# Patient Record
Sex: Female | Born: 1935 | Race: White | Hispanic: No | Smoking: Former smoker
Health system: Southern US, Community
[De-identification: ages and names within clinical notes are randomized; demographics above are authoritative.]

## PROBLEM LIST (undated history)

## (undated) DIAGNOSIS — R413 Other amnesia: Secondary | ICD-10-CM

## (undated) DIAGNOSIS — I6782 Cerebral ischemia: Secondary | ICD-10-CM

## (undated) DIAGNOSIS — Z803 Family history of malignant neoplasm of breast: Secondary | ICD-10-CM

## (undated) DIAGNOSIS — R609 Edema, unspecified: Secondary | ICD-10-CM

## (undated) DIAGNOSIS — R2681 Unsteadiness on feet: Secondary | ICD-10-CM

## (undated) DIAGNOSIS — M1990 Unspecified osteoarthritis, unspecified site: Secondary | ICD-10-CM

## (undated) DIAGNOSIS — C50912 Malignant neoplasm of unspecified site of left female breast: Secondary | ICD-10-CM

## (undated) DIAGNOSIS — F0390 Unspecified dementia without behavioral disturbance: Secondary | ICD-10-CM

## (undated) DIAGNOSIS — N183 Chronic kidney disease, stage 3 (moderate): Secondary | ICD-10-CM

## (undated) DIAGNOSIS — N393 Stress incontinence (female) (male): Secondary | ICD-10-CM

## (undated) DIAGNOSIS — M545 Low back pain, unspecified: Secondary | ICD-10-CM

## (undated) DIAGNOSIS — K219 Gastro-esophageal reflux disease without esophagitis: Secondary | ICD-10-CM

## (undated) DIAGNOSIS — I672 Cerebral atherosclerosis: Secondary | ICD-10-CM

## (undated) DIAGNOSIS — E785 Hyperlipidemia, unspecified: Secondary | ICD-10-CM

## (undated) DIAGNOSIS — R5383 Other fatigue: Secondary | ICD-10-CM

## (undated) DIAGNOSIS — Z9181 History of falling: Secondary | ICD-10-CM

## (undated) DIAGNOSIS — G319 Degenerative disease of nervous system, unspecified: Secondary | ICD-10-CM

## (undated) DIAGNOSIS — M810 Age-related osteoporosis without current pathological fracture: Secondary | ICD-10-CM

## (undated) DIAGNOSIS — I10 Essential (primary) hypertension: Secondary | ICD-10-CM

## (undated) DIAGNOSIS — M25519 Pain in unspecified shoulder: Secondary | ICD-10-CM

## (undated) DIAGNOSIS — M2142 Flat foot [pes planus] (acquired), left foot: Secondary | ICD-10-CM

## (undated) DIAGNOSIS — I255 Ischemic cardiomyopathy: Secondary | ICD-10-CM

## (undated) DIAGNOSIS — R05 Cough: Secondary | ICD-10-CM

## (undated) DIAGNOSIS — K5730 Diverticulosis of large intestine without perforation or abscess without bleeding: Secondary | ICD-10-CM

## (undated) DIAGNOSIS — K56609 Unspecified intestinal obstruction, unspecified as to partial versus complete obstruction: Secondary | ICD-10-CM

## (undated) DIAGNOSIS — N318 Other neuromuscular dysfunction of bladder: Secondary | ICD-10-CM

## (undated) DIAGNOSIS — R059 Cough, unspecified: Secondary | ICD-10-CM

## (undated) DIAGNOSIS — R5381 Other malaise: Secondary | ICD-10-CM

## (undated) DIAGNOSIS — R011 Cardiac murmur, unspecified: Secondary | ICD-10-CM

## (undated) DIAGNOSIS — I213 ST elevation (STEMI) myocardial infarction of unspecified site: Secondary | ICD-10-CM

## (undated) DIAGNOSIS — M2141 Flat foot [pes planus] (acquired), right foot: Secondary | ICD-10-CM

## (undated) DIAGNOSIS — M25559 Pain in unspecified hip: Secondary | ICD-10-CM

## (undated) DIAGNOSIS — H409 Unspecified glaucoma: Secondary | ICD-10-CM

## (undated) DIAGNOSIS — R1031 Right lower quadrant pain: Secondary | ICD-10-CM

## (undated) HISTORY — DX: Cerebral atherosclerosis: I67.2

## (undated) HISTORY — DX: Other malaise: R53.81

## (undated) HISTORY — DX: Unspecified osteoarthritis, unspecified site: M19.90

## (undated) HISTORY — DX: Cerebral ischemia: I67.82

## (undated) HISTORY — DX: History of falling: Z91.81

## (undated) HISTORY — DX: Unsteadiness on feet: R26.81

## (undated) HISTORY — DX: Unspecified glaucoma: H40.9

## (undated) HISTORY — DX: Cardiac murmur, unspecified: R01.1

## (undated) HISTORY — PX: TONSILLECTOMY: SHX5217

## (undated) HISTORY — DX: ST elevation (STEMI) myocardial infarction of unspecified site: I21.3

## (undated) HISTORY — DX: Low back pain, unspecified: M54.50

## (undated) HISTORY — DX: Diverticulosis of large intestine without perforation or abscess without bleeding: K57.30

## (undated) HISTORY — DX: Edema, unspecified: R60.9

## (undated) HISTORY — DX: Family history of malignant neoplasm of breast: Z80.3

## (undated) HISTORY — DX: Unspecified intestinal obstruction, unspecified as to partial versus complete obstruction: K56.609

## (undated) HISTORY — PX: BREAST LUMPECTOMY: SHX2

## (undated) HISTORY — DX: Flat foot (pes planus) (acquired), left foot: M21.42

## (undated) HISTORY — DX: Degenerative disease of nervous system, unspecified: G31.9

## (undated) HISTORY — DX: Low back pain: M54.5

## (undated) HISTORY — DX: Hyperlipidemia, unspecified: E78.5

## (undated) HISTORY — DX: Chronic kidney disease, stage 3 (moderate): N18.3

## (undated) HISTORY — DX: Age-related osteoporosis without current pathological fracture: M81.0

## (undated) HISTORY — DX: Pain in unspecified hip: M25.559

## (undated) HISTORY — DX: Pain in unspecified shoulder: M25.519

## (undated) HISTORY — DX: Gastro-esophageal reflux disease without esophagitis: K21.9

## (undated) HISTORY — DX: Malignant neoplasm of unspecified site of left female breast: C50.912

## (undated) HISTORY — PX: EYE SURGERY: SHX253

## (undated) HISTORY — DX: Other amnesia: R41.3

## (undated) HISTORY — DX: Other fatigue: R53.83

## (undated) HISTORY — DX: Stress incontinence (female) (male): N39.3

## (undated) HISTORY — DX: Right lower quadrant pain: R10.31

## (undated) HISTORY — DX: Essential (primary) hypertension: I10

## (undated) HISTORY — DX: Ischemic cardiomyopathy: I25.5

## (undated) HISTORY — DX: Other neuromuscular dysfunction of bladder: N31.8

## (undated) HISTORY — DX: Flat foot (pes planus) (acquired), right foot: M21.41

---

## 1996-02-19 HISTORY — PX: COLON SURGERY: SHX602

## 1997-09-26 ENCOUNTER — Other Ambulatory Visit: Admission: RE | Admit: 1997-09-26 | Discharge: 1997-09-26 | Payer: Self-pay

## 1998-02-07 ENCOUNTER — Encounter: Payer: Self-pay | Admitting: Emergency Medicine

## 1998-02-07 ENCOUNTER — Emergency Department (HOSPITAL_COMMUNITY): Admission: EM | Admit: 1998-02-07 | Discharge: 1998-02-07 | Payer: Self-pay | Admitting: Emergency Medicine

## 1999-04-21 ENCOUNTER — Encounter: Admission: RE | Admit: 1999-04-21 | Discharge: 1999-04-21 | Payer: Self-pay | Admitting: *Deleted

## 1999-09-26 ENCOUNTER — Other Ambulatory Visit: Admission: RE | Admit: 1999-09-26 | Discharge: 1999-09-26 | Payer: Self-pay | Admitting: *Deleted

## 2000-09-11 ENCOUNTER — Encounter: Admission: RE | Admit: 2000-09-11 | Discharge: 2000-09-11 | Payer: Self-pay | Admitting: Orthopedic Surgery

## 2000-09-11 ENCOUNTER — Encounter: Payer: Self-pay | Admitting: Orthopedic Surgery

## 2000-09-12 ENCOUNTER — Ambulatory Visit (HOSPITAL_BASED_OUTPATIENT_CLINIC_OR_DEPARTMENT_OTHER): Admission: RE | Admit: 2000-09-12 | Discharge: 2000-09-12 | Payer: Self-pay | Admitting: Orthopedic Surgery

## 2000-11-17 ENCOUNTER — Inpatient Hospital Stay (HOSPITAL_COMMUNITY): Admission: RE | Admit: 2000-11-17 | Discharge: 2000-11-21 | Payer: Self-pay | Admitting: Orthopedic Surgery

## 2000-12-19 HISTORY — PX: JOINT REPLACEMENT: SHX530

## 2001-11-17 ENCOUNTER — Ambulatory Visit (HOSPITAL_COMMUNITY): Admission: RE | Admit: 2001-11-17 | Discharge: 2001-11-17 | Payer: Self-pay | Admitting: Gastroenterology

## 2001-12-24 ENCOUNTER — Other Ambulatory Visit: Admission: RE | Admit: 2001-12-24 | Discharge: 2001-12-24 | Payer: Self-pay | Admitting: Obstetrics and Gynecology

## 2002-03-02 ENCOUNTER — Encounter: Admission: RE | Admit: 2002-03-02 | Discharge: 2002-03-02 | Payer: Self-pay | Admitting: Internal Medicine

## 2002-03-02 ENCOUNTER — Encounter: Payer: Self-pay | Admitting: Internal Medicine

## 2003-04-25 ENCOUNTER — Ambulatory Visit (HOSPITAL_COMMUNITY): Admission: RE | Admit: 2003-04-25 | Discharge: 2003-04-25 | Payer: Self-pay | Admitting: Gastroenterology

## 2003-12-01 ENCOUNTER — Ambulatory Visit (HOSPITAL_COMMUNITY): Admission: RE | Admit: 2003-12-01 | Discharge: 2003-12-01 | Payer: Self-pay | Admitting: Gastroenterology

## 2003-12-01 ENCOUNTER — Encounter (INDEPENDENT_AMBULATORY_CARE_PROVIDER_SITE_OTHER): Payer: Self-pay | Admitting: *Deleted

## 2004-05-25 ENCOUNTER — Other Ambulatory Visit: Admission: RE | Admit: 2004-05-25 | Discharge: 2004-05-25 | Payer: Self-pay | Admitting: Obstetrics and Gynecology

## 2004-12-28 ENCOUNTER — Encounter: Admission: RE | Admit: 2004-12-28 | Discharge: 2004-12-28 | Payer: Self-pay | Admitting: Internal Medicine

## 2007-01-08 ENCOUNTER — Encounter: Admission: RE | Admit: 2007-01-08 | Discharge: 2007-01-08 | Payer: Self-pay | Admitting: Internal Medicine

## 2007-02-04 ENCOUNTER — Other Ambulatory Visit: Admission: RE | Admit: 2007-02-04 | Discharge: 2007-02-04 | Payer: Self-pay | Admitting: Obstetrics & Gynecology

## 2007-03-20 ENCOUNTER — Encounter
Admission: RE | Admit: 2007-03-20 | Discharge: 2007-03-20 | Payer: Self-pay | Admitting: Physical Medicine and Rehabilitation

## 2010-02-02 LAB — HM DEXA SCAN: HM Dexa Scan: NORMAL

## 2010-07-06 NOTE — Op Note (Signed)
[name], [name]                ACCOUNT NO.:  [id number]   MEDICAL RECORD NO.:  [id number]          PATIENT TYPE:  AMB   LOCATION:  ENDO                         FACILITY:  [facility]   PHYSICIAN:  Tory Emerald. Benson Norway, MD    DATE OF BIRTH:  1925-06-10   DATE OF PROCEDURE:  12/01/2003  DATE OF DISCHARGE:                                 OPERATIVE REPORT   PROCEDURE:  Flexible sigmoidoscopy.   ENDOSCOPIST:  Tory Emerald. Benson Norway, MD   EQUIPMENT USED:  Pediatric colonoscope.   Consent was obtained from the patient describing the risks of bleeding,  infection, perforation, medication reaction, a 10% miss rate for a small  colon cancer or polyp, and the risk of death, all of which are not exclusive  of any other complications that can occur.   PHYSICAL EXAMINATION:  CARDIAC:  Regular rate and rhythm.  CHEST:  Lungs are clear to auscultation bilaterally.  ABDOMEN:  Flat, soft, nontender, nondistended.   MEDICATIONS:  None given at this time.  Previously provided during the EGD.   PROCEDURE:  After the upper endoscopy was performed, the patient was then  positioned for the flexible sigmoidoscopy.  A rectal examination was  performed, which was negative for any evidence of any masses.  The  colonoscope was then inserted from the anus and advanced into the ileum.  Photo documentation was obtained.  There was no evidence of any ulcers,  masses, inflammation, ulcerations, or bleeding.  Upon withdrawal of the  scope into the colon, there was note of the ileocolonic anastomosis, which  was approximately between 30-40 cm.  Again, there was no evidence of any  anastomotic ulcers or masses in this region.  Upon further withdrawal of the  colonoscope, there was no evidence of any diverticulosis or fresh blood or  any vascular abnormalities in the remnant  colon.  With retroflexion of the colonoscope in the rectum, the patient is  noted to have moderate-sized internal and external hemorrhoids.   PLAN AT  THIS TIME:  Follow up as previously described in the enteroscopy  procedure.       PDH/MEDQ  D:  12/01/2003  T:  12/01/2003  Job:  [id number]   cc:   Merrilee Seashore, M.D.  [address] [place]  [place] [address]  Fax: [phone]

## 2010-07-06 NOTE — Op Note (Signed)
[facility]  Patient:    [name], [name]                       MRN: [id number] Proc. Date: 09/12/00 Adm. Date:  [id number] Attending:  Isaac Laud                           Operative Report  PREOPERATIVE DIAGNOSIS:  Severe degenerative arthritis, left index finger distal interphalangeal joint with large dorsal radial mucous cyst and secondary nail deformity.  POSTOPERATIVE DIAGNOSIS:  Severe degenerative arthritis, left index finger distal interphalangeal joint with large dorsal radial mucous cyst and secondary nail deformity.  OPERATION PERFORMED: 1. Excision of mucous cyst. 2. Distal interphalangeal joint debridement and debridement of osteophytes    from distal and middle phalanges of left index finger at the distal    interphalangeal joint.  SURGEON:  Youlanda Mighty. Sypher, Brooke Bonito., M.D.  ASSISTANT:  Julian Reil, P.A.  ANESTHESIA:  0.25% Marcaine and 2% lidocaine metacarpal head level block, left index finger supplemented by intravenous sedation.  SUPERVISING ANESTHESIOLOGIST:  Dr. Chriss Driver.  INDICATIONS FOR PROCEDURE:  The patient is a 77 year old woman who has had chronic degenerative arthritis.  She was referred by [name]. Symanski, M.D. for evaluation of a painful and enlarging mucous cyst affecting her left index finger distal interphalangeal joint with secondary nail deformity along the radial nail plate.  Clinical examination in the office revealed that she had a bone-on-bone arthropathy at the DIP joint.  We discussed the spectrum of treatment options including observation, excision of the cyst and debridement of the joint versus excision of the cyst and fusion of the joint.  She would prefer to maintain her motion and therefore, we recommended simple debridement of the cyst, removal of osteophytes and joint debridement.  She is brought to the operating room at this time for the above-mentioned surgery. Questions  are invited and answered.  DESCRIPTION OF PROCEDURE:  The patient was brought to the operating room and placed in supine position on the operating table.  Following light sedation, the left arm was prepped with Betadine soap and solution and sterilely draped. 0.25% Marcaine and 2% lidocaine were infiltrated at the metacarpal head level at the base of the left index finger.  The procedure commenced with a curvilinear incision directly over the cyst. Care was taken to identify the cyst membrane.  The cyst was emptied of its contents and a rongeur was used to removed the membrane.  The capsule between the extensor mechanism and the radial collateral  ligament was resected with a scalpel and the joint was debrided with irrigation followed by use of a House curet to remove the marginal osteophytes on the dorsal radial corner of the distal phalanx and middle phalanx.  There was essentially no hyaline articular cartilage within the DIP joint on the head of the middle phalanx and minimal cartilage at the distal phalanx.  It is likely that [name] will continue to have discomfort in this joint due to severe arthritis.  We had advised her preoperatively to consider arthrodesis of the joint should she have persistent pain or recurrent cyst.  The wound was repaired with interrupted sutures of 5-0 nylon.  A compressive dressing was applied with Xeroflo, sterile gauze and Coban.  There were no apparent complications. DD:  09/12/00 TD:  09/12/00 Job: [id number]

## 2010-07-06 NOTE — Op Note (Signed)
NAME:  [name], LAURAIN                          ACCOUNT NO.:  [id number]   MEDICAL RECORD NO.:  [id number]                   PATIENT TYPE:  AMB   LOCATION:  ENDO                                 FACILITY:  [facility]   PHYSICIAN:  Nelwyn Salisbury, M.D.               DATE OF BIRTH:  02/27/1925   DATE OF PROCEDURE:  04/25/2003  DATE OF DISCHARGE:                                 OPERATIVE REPORT   PROCEDURE:  Enteroscopy.   ENDOSCOPIST:  Juanita Craver, M.D.   INSTRUMENT USED:  Olympus video colonoscope (adjustable pediatric scope).   INDICATIONS FOR PROCEDURE:  78 year old white female with a history of a  total colectomy for extensive diverticular disease with rectal bleeding,  rule out AVMs, masses, polyps.   PREPROCEDURE PREPARATION:  Informed consent was obtained from the patient.  The patient was fasted for eight hours prior to the procedure.   PREPROCEDURE PHYSICAL:  Patient with stable vital signs.  Neck supple.  Chest clear to auscultation.  S1 and S2 regular.  Abdomen soft with normal  bowel sounds.   DESCRIPTION OF PROCEDURE:  The patient was placed in the left lateral  decubitus position, sedated with 50 mg of Demerol and 4 mg Versed in slow  incremental doses.  Once the patient was adequately sedated, maintained on  low flow oxygen and continuous cardiac monitoring, the Olympus video  colonoscope was advanced through the mouth piece over the tongue into the  esophagus under direct vision.  The entire esophagus appeared normal with no  evidence of ring, stricture, masses, esophagitis, or Barrett's mucosa.  The  scope was then advanced into the stomach.  Mild atrophic gastritis was  noted.  No erosions, ulcerations, masses or polyps were seen.  No AVMs were  identified.  Retroflexion in the high cardia revealed a small hiatal hernia.  The scope was then advanced into the small bowel up to 120 cm and an  isolated diverticula was seen in the third part of the duodenum.  No AVMs,  erosions, masses, or polyps were identified.  The patient tolerated the  procedure well without complications.   IMPRESSION:  1. Normal appearing esophagus.  2. Mild atrophic gastritis.  3. Small hiatal hernia.  4. Isolated diverticulum in proximal small bowel.  5. No source of bleeding identified.   RECOMMENDATIONS:  1. Continue a high fiber diet with liberal fluid intake.  2. Outpatient follow up in the next two weeks for further recommendations.     If the patient has recurrence of rectal bleeding prior to follow up visit     in the office, she is to contact the office immediately.  Avoidance of     all nonsteroidals have been discovered for now.  Nelwyn Salisbury, M.D.    JNM/MEDQ  D:  04/26/2003  T:  04/26/2003  Job:  [id number]   cc:   Merrilee Seashore, M.D.  [place]  [place] [address]  Fax: [phone]

## 2010-07-06 NOTE — Op Note (Signed)
   NAME:  [name], [name]                          ACCOUNT NO.:  [id number]   MEDICAL RECORD NO.:  [id number]                   PATIENT TYPE:  AMB   LOCATION:  ENDO                                 FACILITY:  [facility]   PHYSICIAN:  Juanita Craver, M.D.                   DATE OF BIRTH:  February 06, 1926   DATE OF PROCEDURE:  11/17/2001  DATE OF DISCHARGE:                                 OPERATIVE REPORT   PREOPERATIVE DIAGNOSIS:  Flexible sigmoidoscopy.   ENDOSCOPIC ASSESSMENT:  Juanita Craver, M.D.   INSTRUMENT USED:  Olympus video colonoscope.   INDICATION FOR PROCEDURE:  A 77 year old white female with a history of  rectal bleeding, status post total colectomy for diverticular disease.   PREPROCEDURE PREPARATION:  Informed consent was procured from the patient.  The patient was fasted for eight hours prior to the procedure and prepped  with a bottle of magnesium citrate and two Fleet's enemas the night prior to  the procedure.   PREPROCEDURE PHYSICAL:  VITAL SIGNS:  The patient had stable vital signs.  NECK:  Supple.  CHEST:  Clear to auscultation.  S1, S2 regular.  ABDOMEN:  Soft with normal bowel sounds.   DESCRIPTION OF PROCEDURE:  The patient was placed in the left lateral  decubitus position and sedated with 50 mg of Demerol and 5 mg of Versed  intravenously.  Once the patient was adequately sedate and maintained on low-  flow oxygen and continuous cardiac monitoring, the Olympus video colonoscope  was advanced from the rectum to about 50 cm without difficulty.  The small  bowel was clearly visualized.  The anastomosis showed some evidence of  granulation tissue with minimal erythema but no ulcerations, erosions,  masses, or polyps were seen.  The terminal ileum appeared healthy and  without lesions.   IMPRESSION:  Healthy anastomosis with minimal erythema with granulation  tissue at the margins, otherwise normal exam up to 50 cm.  Normal distal  ileum.   RECOMMENDATIONS:  1. A  high-fiber diet has been recommended along with liberal fluid intake.  2. Avoidance of nonsteroidals has been encouraged.  3. Outpatient follow-up in the next two weeks.                                               Juanita Craver, M.D.    JM/MEDQ  D:  11/17/2001  T:  11/18/2001  Job:  [id number]

## 2010-07-06 NOTE — Op Note (Signed)
[name], [name]                ACCOUNT NO.:  [id number]   MEDICAL RECORD NO.:  [id number]          PATIENT TYPE:  AMB   LOCATION:  ENDO                         FACILITY:  [facility]   PHYSICIAN:  Tory Emerald. Benson Norway, MD    DATE OF BIRTH:  September 03, 1925   DATE OF PROCEDURE:  12/01/2003  DATE OF DISCHARGE:                                 OPERATIVE REPORT   PROCEDURE:  Enteroscopy.   INDICATIONS:  Bleeding.   CONSENT:  Informed consent was obtained from patient describing the risks of  bleeding, infection, perforation, medication reactions, and the risk of  death, all of which are not exclusive of any other complications that can  occur.   PHYSICAL EXAMINATION:  CARDIAC:  Regular rate and rhythm.  CHEST:  Lungs are clear to auscultation bilaterally.  ABDOMEN:  Soft, nontender, nondistended.   MEDICATIONS:  Versed 5 mg IV and Demerol 50 mg IV.   PROCEDURE:  After adequate sedation was achieved, the endoscope was  introduced from the oral cavity and advanced into the proximal small  intestine.  The endoscope reached approximately to the proximal jejunum.  Upon withdrawal of the endoscope, there was no evidence of any masses,  ulcerations, erosions, or bleeding areas in the small intestine.  Upon entry  into the gastric lumen, there were multiple small ulcerations that were  noted in the antral region as well as a small pyloric channel ulceration.  Again there was no evidence of any bleeding.  There was no evidence of  malignancy.  With retroflexion the patient is noted to have a hiatal hernia  and some erythema in the fundal region.  Biopsies of the fundal region were  taken as well as CLO biopsies taken for evaluation of Helicobacter pylori.  The scope was then straightened and then withdrawn into the esophagus, and Z-  line was noted to be sharp and at 43 cm.  However, there was a mild  irregularity at the Z-line of uncertain significance.  A biopsy was taken of  that area.  Afterwards,  the procedure was terminated.  The patient tolerated  the procedure well, and no complications were encountered.   PLAN AT THIS TIME:  1.  Follow up with biopsies.  2.  Follow up in the clinic in two weeks.  3.  Treat for Helicobacter pylori if the patient is noted to be positive.       PDH/MEDQ  D:  12/01/2003  T:  12/01/2003  Job:  [id number]   cc:   Merrilee Seashore, M.D.  [address] [place]  [place] [address]  Fax: [phone]

## 2010-07-06 NOTE — Op Note (Signed)
[facility]  Patient:    MALITA, FUNES Visit Number: [id number] MRN: [id number]          Service Type: SUR Location: 4W 0472 01 Attending Physician:  Gearlean Alf Proc. Date: 11/17/00 Admit Date:  11/17/2000                             Operative Report  PREOPERATIVE DIAGNOSIS:  Osteoarthritis left knee.  POSTOPERATIVE DIAGNOSIS:  Osteoarthritis left knee.  PROCEDURE:  Left total knee arthroplasty.  SURGEON:  Gaynelle Arabian, M.D.  ASSISTANT:  Netta Cedars, M.D., [facility] Mahar, P.A.-C.  ANESTHESIA:  General.  ESTIMATED BLOOD LOSS:  Minimal.  DRAINS:  Hemovac x 1.  COMPLICATIONS:  None.  TOURNIQUET TIME:  48 minutes at 350 mmHg.  CONDITION:  Stable to recovery.  BRIEF CLINICAL NOTE:  Ms. Annye Asa is a 77 year old female with rapid progressive, symptomatic osteoarthritis of the left knee.  She went from having a slight joint space in November 2001 to complete collapse medially last month.  She has had pain which has been intractable and refractory to nonoperative management.  She presents now for left total knee arthroplasty.  PROCEDURE IN DETAIL:  After the successful administration of general anesthetic, a tourniquet was placed high on the left thigh and left lower extremity prepped and draped in the usual sterile fashion.  The extremity was wrapped in Esmarch, knee flexed, and tourniquet was inflated to 350 mmHg. Standard midline incision was made, skin cut with 10 blade, subcutaneous tissue to the level of the extensor mechanism.  Fresh blade was used to make a medial parapatellar arthrotomy, and soft tissue over the proximal medial tibia is subperiosteally elevated to the joint line with the knife and into semimembranosus bursa with a curved osteotome.  Soft tissue over the proximal and lateral tibia was also elevated with attention being paid to avoid the patellar tendon on tibial tubercle.  The lateral patella is then  everted, knee flexed 90 degrees, and ACL and PCL removed.  The drill was used to create a starting hole in the distal femur for the alignment rod.  Canal was irrigated, and the 5 degree left valgus alignment guide is placed.  Referencing off the posterior condyles, rotation is marked, and a block pinned so as to remove 9 mm off the distal femur.  Distal femoral resection is made with an oscillating saw.  The sizing block is placed, and size 4 is most appropriate.  Rotation is marked, and it corresponds with the epicondylar axis.  The AP block is placed and the anterior and posterior cuts made.  The tibia is subluxed forward, and the menisci removed.  Extra medullary tibial alignment guide is placed, and referencing proximally at the medial third of the tibial tubercle and distally along the second metatarsal axis and tibial crest, the block is then pinned to remove 10 mm off the nondeficient lateral side.  Tibial resection is made with an oscillating saw, and the size 4 was the most appropriate tibial component.  The chamfer block is then placed on the distal femur, and the intercondylar and chamfer cuts are made.  Size 4 posterior stabilized femur with a size 4 fixed bearing tibial tray and 10 mm posterior stabilized insert are placed.  Full extension is achieved with excellent varus and valgus balance down past 100 degrees of flexion.  The patella is then everted and thickness measured to be 23 mm and freehand  resection taken down to 13.  The 38 mm template is placed, lug holes are drilled, patella is placed, and it tracks normally.  The proximal tibia is then prepared with the modular drill and keel punch. There were no osteophytes to remove off the posterior femur.  The cut bone surfaces are then prepared with pulsatile lavage and the cement mixed.  Once ready for implantation, the size 4 tibial tray, size 4 posterior stabilized femur, and 38 mm patella are cemented into place, and  the patella is held with a clamp.  The trial inserts are placed, knee held in full extension, and extruded cement removed.  Once the cement is fully hardened, then the permanent 10 mm posterior stabilized fixed bearing insert is impacted into the tibial tray.  The wound is then copiously irrigated with antibiotic solution, and the extensor mechanism closed over the Hemovac drain with interrupted #1 PDS.  Tourniquet is released with a total time of 48 minutes.  Flexion against gravity is approximately 125 degrees.  Subcu is closed with interrupted 2-0 Vicryl, subcuticular running 4-0 Monocryl.  Incision is clean and dry, and Steri-Strips and a bulky sterile dressing applied.  The patient is then awakened and transported to recovery in stable condition. Attending Physician:  Gearlean Alf DD:  11/17/00 TD:  11/17/00 Job: [id number]

## 2010-07-06 NOTE — Discharge Summary (Signed)
[facility]  Patient:    [name], [name] Visit Number: [id number] MRN: [id number]          Service Type: SUR Location: 4W [place] 01 Attending Physician:  Gearlean Alf Dictated by:   Jenetta Loges, P.A. Admit Date:  11/17/2000 Discharge Date: 11/21/2000                             Discharge Summary  ADMITTING DIAGNOSES: 1. End-stage osteoarthritis of the left knee. 2. Glaucoma. 3. Hyperactive bladder. 4. Status post colectomy secondary to diverticula.  DISCHARGE DIAGNOSES: 1. End-stage osteoarthritis of the left knee. 2. Glaucoma. 3. Hyperactive bladder. 4. Status post colectomy secondary to diverticula. 5. Status post left total knee arthroplasty.  OPERATION:  Left total knee arthroplasty.  Surgeon:  Gaynelle Arabian, M.D.; assistants:  Dr. ______ and Trenda Moots. Mahar, P.A.; under general anesthesia.  HISTORY OF PRESENT ILLNESS:  [name] is a 77 year old female with end-stage osteoarthritis affecting the left knee and has failed conservative measures including intra-articular injections.  She has demonstrated bone-on-bone deformity and significant pain affecting ADLs.  At this time, she wishes to proceed with left total knee arthroplasty.  Risks and benefits were discussed with the patient in detail and she wished to proceed.  HOSPITAL COURSE:  Patient was admitted, underwent the above-named procedure and tolerated this well.  All appropriate IV antibiotics and analgesics were utilized.  Postoperatively, the patient was placed on DVT and PE prophylaxis with Coumadin and in total knee replacement protocol with weightbearing as tolerated to the operative extremity.  Postoperatively, she progressed well with physical therapy.  She remained hemodynamically stable with hemoglobins noted to be at 9.9 on postoperative day #2 and 9.7 on postoperative day #3. She was progressed to a regular diet.  She did have postoperative diarrhea associated  with the iron as well as the Colace and these were discontinued showing reduction in the amount of bowel movements.  By postoperative day #4, November 21, 2000, she was afebrile.  She had had one isolated temperature on the previous day of 101.5.  She was demonstrating no abdominal pain, no shortness of breath, no dysuria.  She met her therapy goals, was ambulating by herself in the room.  She had had only two bowel movements since midnight the night before.  She was also afebrile.  Her abdomen was nontender.  Incision was clean and dry with no erythema, no drainage.  At this time, she felt medically and orthopedically stable for discharge to home in the care of her husband and with home health PT, RN arranged.  LABORATORY DATA:  Hemoglobin on admission 12.4, postoperatively 9.9 and 9.7. Pro times and INRs have been monitored by the pharmacy in the chart.  See chart for details.  Chemistries on admission within normal limits, postoperatively also normal.  Calcium was borderline at 8.2.  Urinalysis normal on admission.  Blood type O positive.  Radiology shows an outpatient chest x-ray with no active disease, mild COPD from November 10, 2000.  EKG outpatient shows normal sinus rhythm.  CONDITION ON DISCHARGE:  Stable and improved.  DISCHARGE MEDICATIONS AND PLANS:  Patient is being discharged to home in the care of her family.  She has a home health physical therapy, occupational therapy arranged.  Resume home medications and diet.  Coumadin per pharmacy. Prescriptions are given for Percocet 5 mg #40 one to two every 4 to 6 p.r.n. pain, Robaxin 500 mg #30  one every 8 p.r.n. spasm.  Follow up two weeks postoperatively.  Call for time.  Weightbearing as tolerated.  Total knee replacement protocol.  Call for problems. Dictated by:   Jenetta Loges, P.A. Attending Physician:  Gearlean Alf DD:  11/21/00 TD:  11/21/00 Job: [id number]

## 2010-07-06 NOTE — H&P (Signed)
[facility]  Patient:    [name], [name] Visit Number: [id number] MRN: [id number]          Service Type: Attending:  Gaynelle Arabian, M.D. Dictated by:   Elodia Florence. Clabe Seal, P.A. Adm. Date:  11/17/00   CC:         Genice Rouge, M.D.   History and Physical  DATE OF BIRTH:  03-09-1925  CHIEF COMPLAINT:  "Pain in my left knee."  PRESENT ILLNESS:  This is a 77 year old female seen by [place] for continuing and progressive problems concerning her left knee.  She had been treated with Celebrex as well as injections into the knee with Depo-Medrol, which have really not offered her much long-standing relief.  X-rays have shown a continuing and progressive deterioration of the knee joint secondary to osteoarthritis.  She has bone-on-bone changes specifically in the medial compartment with patellofemoral narrowing.  Due to these positive findings and the fact that this patient is a very active lady, it is felt she would benefit with surgical intervention and being admitted for a total knee replacement arthroplasty.  The patient will be seen and cleared preoperatively by Dr. Genice Rouge of [place] HISTORY:  This lady has really been in good health throughout her lifetime.  She medically does have glaucoma and urinary urgency.  She has had history of a colectomy of at least 90% secondary to diverticula.  This was done in 1998.  She has had 2 breast biopsies, about 20 years ago.  At that time, she suffered a "collapsed lung" on the left.  CURRENT MEDICATIONS: 1. Detrol LA 4 mg one q.d. 2. Evista 60 mg one q.d. 3. Celebrex 200 mg one q.d. 4. Betoptic S 5 ml one drop each eye b.i.d. 5. Tylenol for discomfort.  ALLERGIES:  She has no medical allergies.  FAMILY HISTORY:  Family history is positive for colon cancer in the father who died at age 43.  SOCIAL HISTORY:  The patient has occasional intake of wine and no  tobacco products.  REVIEW OF SYSTEMS:  CNS:  No seizure disorder, paralysis, numbness or double vision.  RESPIRATORY:  No productive cough, no hemoptysis, no shortness of breath.  CARDIOVASCULAR:  No chest pain, no angina, no orthopnea. GASTROINTESTINAL:  No nausea, vomiting, melena or bloody stools. GENITOURINARY:  The patient has urgency without dysuria or hematuria. MUSCULOSKELETAL:  Primarily in present illness with the left knee.  PHYSICAL EXAMINATION:  GENERAL:  Alert, cooperative, friendly, bright 77 year old white female.  VITAL SIGNS:  Blood pressure 138/62, pulse 78, respirations are 12.  HEENT:  Normocephalic.  Wears glasses.  PERRLA.  EOM intact.  Oropharynx is clear.  CHEST:  Clear to auscultation.  No rhonchi nor rales.  HEART:  Regular rate and rhythm.  There is a grade 2/6 holosystolic murmur heard best at the left sternal border in the second interspace.  ABDOMEN:  Soft and nontender.  Liver and spleen not felt.  GENITALIA/PELVIC:  Not done, not pertinent to present illness.  RECTAL:  Not done, not pertinent to present illness.  BREASTS:  Not done, not pertinent to present illness.  EXTREMITIES:  Left knee as in present illness above.  ADMITTING DIAGNOSES: 1. Severe osteoarthritis of the left knee. 2. Glaucoma. 3. Urinary urgency.  PLAN:  The patient will undergo a left total knee replacement arthroplasty. She has donated no blood for this procedure.  As her husband has had total joint replacement in the past, she  is comfortable with home health after her regular surgery for her rehabilitation.  She will be cleared preoperatively by Dr. Leda Quail.  Should we have any medical problems in the hospital, we will certainly call upon Dr. Leda Quail to follow along with [place] during her hospitalization. Dictated by:   Elodia Florence. Clabe Seal, P.A. Attending:  Gaynelle Arabian, M.D. DD:  11/10/00 TD:  11/10/00 Job: [id number]

## 2010-09-17 ENCOUNTER — Encounter: Payer: Self-pay | Admitting: Podiatry

## 2010-11-15 ENCOUNTER — Inpatient Hospital Stay (HOSPITAL_COMMUNITY)
Admission: EM | Admit: 2010-11-15 | Discharge: 2010-11-28 | DRG: 336 | Disposition: A | Discharge status: Home Health | Payer: Medicare Other | Attending: Internal Medicine | Admitting: Internal Medicine

## 2010-11-15 ENCOUNTER — Emergency Department (HOSPITAL_COMMUNITY): Payer: Medicare Other

## 2010-11-15 DIAGNOSIS — E785 Hyperlipidemia, unspecified: Secondary | ICD-10-CM | POA: Diagnosis present

## 2010-11-15 DIAGNOSIS — I129 Hypertensive chronic kidney disease with stage 1 through stage 4 chronic kidney disease, or unspecified chronic kidney disease: Secondary | ICD-10-CM | POA: Diagnosis present

## 2010-11-15 DIAGNOSIS — M1990 Unspecified osteoarthritis, unspecified site: Secondary | ICD-10-CM | POA: Diagnosis present

## 2010-11-15 DIAGNOSIS — D62 Acute posthemorrhagic anemia: Secondary | ICD-10-CM | POA: Diagnosis present

## 2010-11-15 DIAGNOSIS — R34 Anuria and oliguria: Secondary | ICD-10-CM | POA: Diagnosis not present

## 2010-11-15 DIAGNOSIS — H409 Unspecified glaucoma: Secondary | ICD-10-CM | POA: Diagnosis present

## 2010-11-15 DIAGNOSIS — K56609 Unspecified intestinal obstruction, unspecified as to partial versus complete obstruction: Secondary | ICD-10-CM

## 2010-11-15 DIAGNOSIS — D72829 Elevated white blood cell count, unspecified: Secondary | ICD-10-CM | POA: Diagnosis present

## 2010-11-15 DIAGNOSIS — N179 Acute kidney failure, unspecified: Secondary | ICD-10-CM | POA: Diagnosis present

## 2010-11-15 DIAGNOSIS — E46 Unspecified protein-calorie malnutrition: Secondary | ICD-10-CM | POA: Diagnosis not present

## 2010-11-15 DIAGNOSIS — G589 Mononeuropathy, unspecified: Secondary | ICD-10-CM | POA: Diagnosis present

## 2010-11-15 DIAGNOSIS — Z7982 Long term (current) use of aspirin: Secondary | ICD-10-CM

## 2010-11-15 DIAGNOSIS — Z79899 Other long term (current) drug therapy: Secondary | ICD-10-CM

## 2010-11-15 DIAGNOSIS — Z85038 Personal history of other malignant neoplasm of large intestine: Secondary | ICD-10-CM

## 2010-11-15 DIAGNOSIS — Z9049 Acquired absence of other specified parts of digestive tract: Secondary | ICD-10-CM

## 2010-11-15 DIAGNOSIS — R32 Unspecified urinary incontinence: Secondary | ICD-10-CM | POA: Diagnosis present

## 2010-11-15 DIAGNOSIS — K565 Intestinal adhesions [bands], unspecified as to partial versus complete obstruction: Principal | ICD-10-CM | POA: Diagnosis present

## 2010-11-15 DIAGNOSIS — Z96659 Presence of unspecified artificial knee joint: Secondary | ICD-10-CM

## 2010-11-15 DIAGNOSIS — E871 Hypo-osmolality and hyponatremia: Secondary | ICD-10-CM | POA: Diagnosis not present

## 2010-11-15 DIAGNOSIS — N183 Chronic kidney disease, stage 3 unspecified: Secondary | ICD-10-CM | POA: Diagnosis present

## 2010-11-15 DIAGNOSIS — E876 Hypokalemia: Secondary | ICD-10-CM | POA: Diagnosis present

## 2010-11-15 DIAGNOSIS — R109 Unspecified abdominal pain: Secondary | ICD-10-CM

## 2010-11-15 HISTORY — PX: LAPAROSCOPIC LYSIS OF ADHESIONS: SHX5905

## 2010-11-15 LAB — DIFFERENTIAL
Basophils Absolute: 0 10*3/uL (ref 0.0–0.1)
Eosinophils Absolute: 0 10*3/uL (ref 0.0–0.7)
Eosinophils Relative: 0 % (ref 0–5)
Neutro Abs: 8.9 10*3/uL — ABNORMAL HIGH (ref 1.7–7.7)

## 2010-11-15 LAB — COMPREHENSIVE METABOLIC PANEL
Albumin: 3.4 g/dL — ABNORMAL LOW (ref 3.5–5.2)
BUN: 53 mg/dL — ABNORMAL HIGH (ref 6–23)
CO2: 32 mEq/L (ref 19–32)
Chloride: 95 mEq/L — ABNORMAL LOW (ref 96–112)
GFR calc non Af Amer: 29 mL/min — ABNORMAL LOW (ref >60)
Sodium: 139 mEq/L (ref 135–145)
Total Protein: 7.7 g/dL (ref 6.0–8.3)

## 2010-11-15 LAB — CBC
Hemoglobin: 14.5 g/dL (ref 12.0–15.0)
MCH: 28.9 pg (ref 26.0–34.0)
MCHC: 33.5 g/dL (ref 30.0–36.0)
MCV: 86.4 fL (ref 78.0–100.0)
Platelets: 310 10*3/uL (ref 150–400)
RBC: 5.01 MIL/uL (ref 3.87–5.11)

## 2010-11-15 LAB — URINE MICROSCOPIC-ADD ON

## 2010-11-15 LAB — URINALYSIS, ROUTINE W REFLEX MICROSCOPIC
Protein, ur: 100 mg/dL — AB
Urobilinogen, UA: 0.2 mg/dL (ref 0.0–1.0)
pH: 6 (ref 5.0–8.0)

## 2010-11-15 LAB — HEMOGLOBIN AND HEMATOCRIT, BLOOD
HCT: 38.1 % (ref 36.0–46.0)
Hemoglobin: 10.9 g/dL — ABNORMAL LOW (ref 12.0–15.0)

## 2010-11-15 MED ORDER — IOHEXOL 300 MG/ML  SOLN
100.0000 mL | Freq: Once | INTRAMUSCULAR | Status: AC | PRN
  Filled 2010-11-15: qty 100

## 2010-11-16 ENCOUNTER — Inpatient Hospital Stay (HOSPITAL_COMMUNITY): Payer: Medicare Other

## 2010-11-16 LAB — HEMOGLOBIN AND HEMATOCRIT, BLOOD
Hemoglobin: 11.7 g/dL — ABNORMAL LOW (ref 12.0–15.0)
Hemoglobin: 12.4 g/dL (ref 12.0–15.0)

## 2010-11-16 LAB — CBC
Hemoglobin: 11.7 g/dL — ABNORMAL LOW (ref 12.0–15.0)
MCHC: 32.7 g/dL (ref 30.0–36.0)
MCV: 86.5 fL (ref 78.0–100.0)
RBC: 4.14 MIL/uL (ref 3.87–5.11)
RDW: 15.8 % — ABNORMAL HIGH (ref 11.5–15.5)
WBC: 8.7 10*3/uL (ref 4.0–10.5)

## 2010-11-16 LAB — COMPREHENSIVE METABOLIC PANEL
Calcium: 8.7 mg/dL (ref 8.4–10.5)
Chloride: 98 mEq/L (ref 96–112)
Creatinine, Ser: 1.33 mg/dL — ABNORMAL HIGH (ref 0.50–1.10)
Glucose, Bld: 109 mg/dL — ABNORMAL HIGH (ref 70–99)
Potassium: 3.1 mEq/L — ABNORMAL LOW (ref 3.5–5.1)
Sodium: 137 mEq/L (ref 135–145)

## 2010-11-16 LAB — DIFFERENTIAL
Basophils Relative: 0 % (ref 0–1)
Eosinophils Absolute: 0.1 10*3/uL (ref 0.0–0.7)
Lymphocytes Relative: 21 % (ref 12–46)
Lymphs Abs: 1.8 10*3/uL (ref 0.7–4.0)
Monocytes Absolute: 1.8 10*3/uL — ABNORMAL HIGH (ref 0.1–1.0)
Monocytes Relative: 21 % — ABNORMAL HIGH (ref 3–12)

## 2010-11-16 LAB — URINE CULTURE
Culture  Setup Time: 201209271407
Culture: NO GROWTH

## 2010-11-16 LAB — APTT: aPTT: 29 seconds (ref 24–37)

## 2010-11-16 LAB — LIPID PANEL
HDL: 50 mg/dL (ref >39)
LDL Cholesterol: 61 mg/dL (ref 0–99)
Total CHOL/HDL Ratio: 2.8 RATIO
Triglycerides: 155 mg/dL — ABNORMAL HIGH (ref <150)

## 2010-11-17 ENCOUNTER — Inpatient Hospital Stay (HOSPITAL_COMMUNITY): Payer: Medicare Other

## 2010-11-17 LAB — HEMOGLOBIN AND HEMATOCRIT, BLOOD
HCT: 33.7 % — ABNORMAL LOW (ref 36.0–46.0)
HCT: 37.2 % (ref 36.0–46.0)
Hemoglobin: 11 g/dL — ABNORMAL LOW (ref 12.0–15.0)

## 2010-11-17 LAB — BASIC METABOLIC PANEL
BUN: 19 mg/dL (ref 6–23)
Calcium: 8.7 mg/dL (ref 8.4–10.5)
GFR calc Af Amer: 56 mL/min — ABNORMAL LOW (ref >60)
GFR calc non Af Amer: 46 mL/min — ABNORMAL LOW (ref >60)
Glucose, Bld: 115 mg/dL — ABNORMAL HIGH (ref 70–99)
Potassium: 3.6 mEq/L (ref 3.5–5.1)

## 2010-11-17 LAB — CBC
HCT: 34.8 % — ABNORMAL LOW (ref 36.0–46.0)
Hemoglobin: 11.1 g/dL — ABNORMAL LOW (ref 12.0–15.0)
MCH: 27.7 pg (ref 26.0–34.0)
MCHC: 31.9 g/dL (ref 30.0–36.0)
RDW: 15.3 % (ref 11.5–15.5)

## 2010-11-18 LAB — CBC
HCT: 35 % — ABNORMAL LOW (ref 36.0–46.0)
Hemoglobin: 11.5 g/dL — ABNORMAL LOW (ref 12.0–15.0)
MCH: 28.5 pg (ref 26.0–34.0)
MCHC: 32.9 g/dL (ref 30.0–36.0)
MCV: 86.6 fL (ref 78.0–100.0)
RDW: 15.3 % (ref 11.5–15.5)

## 2010-11-18 LAB — BASIC METABOLIC PANEL
BUN: 9 mg/dL (ref 6–23)
Calcium: 8.8 mg/dL (ref 8.4–10.5)
Creatinine, Ser: 1.07 mg/dL (ref 0.50–1.10)
GFR calc Af Amer: 59 mL/min — ABNORMAL LOW (ref >60)
GFR calc non Af Amer: 49 mL/min — ABNORMAL LOW (ref >60)
Glucose, Bld: 117 mg/dL — ABNORMAL HIGH (ref 70–99)

## 2010-11-19 ENCOUNTER — Inpatient Hospital Stay (HOSPITAL_COMMUNITY): Payer: Medicare Other

## 2010-11-20 ENCOUNTER — Inpatient Hospital Stay (HOSPITAL_COMMUNITY): Payer: Medicare Other

## 2010-11-20 LAB — CBC
Hemoglobin: 11.4 g/dL — ABNORMAL LOW (ref 12.0–15.0)
MCH: 28.3 pg (ref 26.0–34.0)
MCHC: 33 g/dL (ref 30.0–36.0)
RDW: 15.2 % (ref 11.5–15.5)

## 2010-11-20 LAB — BASIC METABOLIC PANEL
Calcium: 9.1 mg/dL (ref 8.4–10.5)
GFR calc Af Amer: 54 mL/min — ABNORMAL LOW (ref >90)
GFR calc non Af Amer: 47 mL/min — ABNORMAL LOW (ref >90)
Glucose, Bld: 118 mg/dL — ABNORMAL HIGH (ref 70–99)
Potassium: 4 mEq/L (ref 3.5–5.1)
Sodium: 134 mEq/L — ABNORMAL LOW (ref 135–145)

## 2010-11-21 ENCOUNTER — Inpatient Hospital Stay (HOSPITAL_COMMUNITY): Payer: Medicare Other

## 2010-11-21 DIAGNOSIS — K565 Intestinal adhesions [bands], unspecified as to partial versus complete obstruction: Secondary | ICD-10-CM

## 2010-11-21 LAB — BASIC METABOLIC PANEL
Calcium: 8.7 mg/dL (ref 8.4–10.5)
Chloride: 100 mEq/L (ref 96–112)
Creatinine, Ser: 1 mg/dL (ref 0.50–1.10)
GFR calc Af Amer: 59 mL/min — ABNORMAL LOW (ref >90)
Sodium: 132 mEq/L — ABNORMAL LOW (ref 135–145)

## 2010-11-21 LAB — CBC
MCH: 28.1 pg (ref 26.0–34.0)
MCV: 84.7 fL (ref 78.0–100.0)
Platelets: 287 10*3/uL (ref 150–400)
RBC: 4.05 MIL/uL (ref 3.87–5.11)
RDW: 15.1 % (ref 11.5–15.5)
WBC: 13.8 10*3/uL — ABNORMAL HIGH (ref 4.0–10.5)

## 2010-11-21 LAB — SURGICAL PCR SCREEN
MRSA, PCR: NEGATIVE
Staphylococcus aureus: POSITIVE — AB

## 2010-11-21 NOTE — Progress Notes (Signed)
[name], [name] NO.:  [id number]  MEDICAL RECORD NO.:  [id number]  LOCATION:  [id number]                         FACILITY:  [facility]  PHYSICIAN:  Jacquelynn Cree, M.D.   DATE OF BIRTH:  24-Feb-1925                                PROGRESS NOTE   DATE OF DISCHARGE: Pending.  PRIMARY CARE PHYSICIAN: Merrilee Seashore, M.D.  CURRENT DIAGNOSES: 1. Small-bowel obstruction. 2. Protein-calorie malnutrition. 3. Hypokalemia. 4. Acute renal failure. 5. Underlying stage 3 chronic kidney disease. 6. Acute blood loss anemia secondary to GI bleed. 7. Hypertension. 8. Glaucoma. 9. Osteoarthritis. 10.Hyponatremia. 11.History of colon cancer status post subtotal colectomy in 1998.  DISCHARGE MEDICATIONS: Will be dictated time of actual discharge.  CONSULTATIONS: Fenton Malling. Lucia Gaskins, M.D. of General Surgery.  BRIEF ADMISSION HISTORY OF PRESENT ILLNESS: The patient is an 77 year old female with past medical history of colon cancer status post subtotal colectomy, who presented to the hospital with chief complaint of abdominal pain accompanied by intractable nausea and vomiting.  She also complained of coffee-ground emesis.  Upon initial evaluation in the emergency department, the patient had imaging studies done, which showed a high-grade mid small-bowel obstruction secondary to adhesions.  She subsequently was referred to the hospitalist service for further evaluation and treatment.  PROCEDURES AND DIAGNOSTIC STUDIES: 1. CT scan of the abdomen and pelvis on November 15, 2010, showed a     high-grade mid small-bowel obstruction, likely secondary to     adhesions.  No complicating ischemia.  Possible trace perihepatic     ascites. 2. Two views of the abdomen done serially on 9/28, 9/29, 10/01, and     10/02.  Initial films showed small-bowel obstruction pattern, but     over the past 2 days, the small-bowel obstruction pattern showed     improvement with decreased  distention.  DISCHARGE LABORATORY VALUES: Will be dictated at the time of actual discharge.  HOSPITAL COURSE BY PROBLEM: 1. High-grade small-bowel obstruction secondary to adhesions:  The     patient was admitted and put on bowel rest and NG tube     decompression of the stomach.  She never had any abdominal pain and     the NG tube decompression of her stomach resolved the issue with     nausea and vomiting.  The patient had 1 small bowel movement, but     no flatus so far over the course of her hospital stay.  The     appearance of the small-bowel obstruction has improved     radiographically, but the patient has not yet passed flatus and her     NG tube continues to drain large amounts of bilious material.  The     patient is being followed closely by Surgery with plans to perform     an exploratory laparoscopy if there is no additional improvement in     the next 24-48 hours. 2. Protein-calorie malnutrition secondary to decreased p.o. intake     secondary to high-grade small-bowel obstruction:  If the patient     does not have resolution of her small-bowel obstruction within the     next 24 hours, we will need to  consider total parenteral nutrition. 3. Hypokalemia:  [name] potassium has been repleted and she is     being supplemented in her IV fluids. 4. Acute renal failure/stage 3 chronic kidney disease:  [name]     admission creatinine was elevated at 1.69.  With IV fluid     hydration, her creatinine has stabilized out to 1.07, which is     likely her baseline.  She does have underlying stage 3 chronic     kidney disease at this creatinine with a GFR estimated 47 mL/min. 5. Acute blood loss anemia secondary to GI bleed:  The patient did     have black NG tube drainage during the early part of her     hospitalization.  Over time, this has cleared to a more bilious     appearing fluid.  Her hemoglobin has dropped 4 g from its initial     value, but has stabilized  over the past 48 hours with no additional     further drops.  We continue to monitor her hemoglobin closely. 6. Hypertension:  [name] blood pressure is currently well     controlled. 7. Glaucoma:  [name] glaucoma is stable. 8. Osteoarthritis:  No complaints. 9. Hyponatremia:  Felt to be secondary to hypotonic IV fluids.  We are     changing her IV fluids to isotonic fluids.  DISPOSITION: The patient is not yet medically stable for discharge.  Discharge summary addendum will be dictated at the time of actual discharge.     Jacquelynn Cree, M.D.     CR/MEDQ  D:  11/20/2010  T:  11/20/2010  Job:  [id number]  cc:   Merrilee Seashore, M.D. [phone]  Electronically Signed by Jacquelynn Cree M.D. on 11/21/2010 01:54:43 PM

## 2010-11-22 LAB — CBC
HCT: 37.3 % (ref 36.0–46.0)
MCV: 86.1 fL (ref 78.0–100.0)
Platelets: 347 10*3/uL (ref 150–400)
RBC: 4.33 MIL/uL (ref 3.87–5.11)
WBC: 20.6 10*3/uL — ABNORMAL HIGH (ref 4.0–10.5)

## 2010-11-22 LAB — COMPREHENSIVE METABOLIC PANEL
AST: 20 U/L (ref 0–37)
Albumin: 2.3 g/dL — ABNORMAL LOW (ref 3.5–5.2)
BUN: 11 mg/dL (ref 6–23)
Creatinine, Ser: 0.98 mg/dL (ref 0.50–1.10)
Total Protein: 6.3 g/dL (ref 6.0–8.3)

## 2010-11-22 LAB — BASIC METABOLIC PANEL
BUN: 11 mg/dL (ref 6–23)
CO2: 24 mEq/L (ref 19–32)
Chloride: 97 mEq/L (ref 96–112)
Creatinine, Ser: 1.02 mg/dL (ref 0.50–1.10)
Potassium: 3.8 mEq/L (ref 3.5–5.1)

## 2010-11-24 ENCOUNTER — Inpatient Hospital Stay (HOSPITAL_COMMUNITY): Payer: Medicare Other

## 2010-11-24 LAB — CBC
HCT: 30.6 % — ABNORMAL LOW (ref 36.0–46.0)
Hemoglobin: 10 g/dL — ABNORMAL LOW (ref 12.0–15.0)
MCV: 86 fL (ref 78.0–100.0)
RDW: 15.4 % (ref 11.5–15.5)
WBC: 15.3 10*3/uL — ABNORMAL HIGH (ref 4.0–10.5)

## 2010-11-24 LAB — BASIC METABOLIC PANEL
CO2: 19 mEq/L (ref 19–32)
Calcium: 7.9 mg/dL — ABNORMAL LOW (ref 8.4–10.5)
Chloride: 104 mEq/L (ref 96–112)
Glucose, Bld: 69 mg/dL — ABNORMAL LOW (ref 70–99)
Potassium: 2.8 mEq/L — ABNORMAL LOW (ref 3.5–5.1)
Sodium: 137 mEq/L (ref 135–145)

## 2010-11-24 LAB — GLUCOSE, CAPILLARY: Glucose-Capillary: 124 mg/dL — ABNORMAL HIGH (ref 70–99)

## 2010-11-24 LAB — MAGNESIUM: Magnesium: 1.8 mg/dL (ref 1.5–2.5)

## 2010-11-25 LAB — DIFFERENTIAL
Basophils Absolute: 0 10*3/uL (ref 0.0–0.1)
Lymphocytes Relative: 14 % (ref 12–46)
Lymphs Abs: 1.2 10*3/uL (ref 0.7–4.0)
Neutro Abs: 5.6 10*3/uL (ref 1.7–7.7)
Neutrophils Relative %: 66 % (ref 43–77)

## 2010-11-25 LAB — COMPREHENSIVE METABOLIC PANEL
Albumin: 2 g/dL — ABNORMAL LOW (ref 3.5–5.2)
Alkaline Phosphatase: 46 U/L (ref 39–117)
BUN: 16 mg/dL (ref 6–23)
CO2: 22 mEq/L (ref 19–32)
Chloride: 103 mEq/L (ref 96–112)
Creatinine, Ser: 0.76 mg/dL (ref 0.50–1.10)
GFR calc non Af Amer: 76 mL/min — ABNORMAL LOW (ref >90)
Potassium: 2.9 mEq/L — ABNORMAL LOW (ref 3.5–5.1)
Total Bilirubin: 0.2 mg/dL — ABNORMAL LOW (ref 0.3–1.2)

## 2010-11-25 LAB — CBC
HCT: 27.4 % — ABNORMAL LOW (ref 36.0–46.0)
Hemoglobin: 9.3 g/dL — ABNORMAL LOW (ref 12.0–15.0)
MCV: 83.8 fL (ref 78.0–100.0)
RBC: 3.27 MIL/uL — ABNORMAL LOW (ref 3.87–5.11)
WBC: 8.6 10*3/uL (ref 4.0–10.5)

## 2010-11-25 LAB — MAGNESIUM: Magnesium: 1.7 mg/dL (ref 1.5–2.5)

## 2010-11-25 LAB — PREALBUMIN: Prealbumin: 8 mg/dL — ABNORMAL LOW (ref 17.0–34.0)

## 2010-11-25 LAB — PHOSPHORUS: Phosphorus: 1.2 mg/dL — ABNORMAL LOW (ref 2.3–4.6)

## 2010-11-25 LAB — GLUCOSE, CAPILLARY: Glucose-Capillary: 127 mg/dL — ABNORMAL HIGH (ref 70–99)

## 2010-11-26 LAB — PHOSPHORUS: Phosphorus: 2.6 mg/dL (ref 2.3–4.6)

## 2010-11-26 LAB — DIFFERENTIAL
Basophils Absolute: 0 10*3/uL (ref 0.0–0.1)
Eosinophils Relative: 6 % — ABNORMAL HIGH (ref 0–5)
Lymphocytes Relative: 17 % (ref 12–46)
Lymphs Abs: 1.4 10*3/uL (ref 0.7–4.0)
Monocytes Absolute: 1.2 10*3/uL — ABNORMAL HIGH (ref 0.1–1.0)
Neutro Abs: 5.3 10*3/uL (ref 1.7–7.7)

## 2010-11-26 LAB — MAGNESIUM: Magnesium: 1.7 mg/dL (ref 1.5–2.5)

## 2010-11-26 LAB — COMPREHENSIVE METABOLIC PANEL
Alkaline Phosphatase: 51 U/L (ref 39–117)
BUN: 13 mg/dL (ref 6–23)
CO2: 23 mEq/L (ref 19–32)
Chloride: 106 mEq/L (ref 96–112)
Creatinine, Ser: 0.74 mg/dL (ref 0.50–1.10)
GFR calc Af Amer: 89 mL/min — ABNORMAL LOW (ref >90)
GFR calc non Af Amer: 77 mL/min — ABNORMAL LOW (ref >90)
Glucose, Bld: 124 mg/dL — ABNORMAL HIGH (ref 70–99)
Potassium: 3.5 mEq/L (ref 3.5–5.1)
Total Bilirubin: 0.2 mg/dL — ABNORMAL LOW (ref 0.3–1.2)

## 2010-11-26 LAB — CBC
HCT: 29.9 % — ABNORMAL LOW (ref 36.0–46.0)
Hemoglobin: 9.9 g/dL — ABNORMAL LOW (ref 12.0–15.0)
MCV: 84.7 fL (ref 78.0–100.0)
RDW: 15.4 % (ref 11.5–15.5)
WBC: 8.4 10*3/uL (ref 4.0–10.5)

## 2010-11-26 LAB — GLUCOSE, CAPILLARY: Glucose-Capillary: 86 mg/dL (ref 70–99)

## 2010-11-26 NOTE — H&P (Signed)
NAMEJAMECA, [name]                ACCOUNT NO.:  [id number]  MEDICAL RECORD NO.:  [id number]  LOCATION:  [id number]                         FACILITY:  [facility]  PHYSICIAN:  Jonte Wollam, DO         DATE OF BIRTH:  02/21/1925  DATE OF ADMISSION:  11/15/2010 DATE OF DISCHARGE:                             HISTORY & PHYSICAL   CHIEF COMPLAINT:  Abdominal pain, nausea, vomiting.  HISTORY OF PRESENT ILLNESS:  The patient states she started having abdominal pain that was in the lower right quadrant last Monday.  She then developed nausea and vomiting.  The nausea and vomiting has continued.  She has had episodes of which she calls a very dark or black emesis as well as green.  At one point, she had some watery sort of emesis that appeared pink, which she attributed to some cranberry juice. She denies any fevers or chills.  PAST MEDICAL HISTORY:  Significant for: 1. Hypertension. 2. Glaucoma. 3. Osteoarthritis.  PAST SURGICAL HISTORY:  She had a subtotal colectomy in 1998 and total knee replacement in 2000.  SOCIAL HISTORY:  No tobacco, no alcohol, no recreational drug use.  HOME MEDICATIONS: 1. Meloxicam 15 mg 2 tablets daily. 2. Lyrica 50 mg two p.o. daily. 3. Vitamin B12 one tablet 3 times a week. 4. Triamterene/hydrochlorothiazide 37.5/25 one p.o. daily. 5. Nexium 40 mg one p.o. daily. 6. Lumigan 1 drop both eyes at bedtime. 7. Betimol 1 drop every morning, both eyes. 8. Multivitamin one p.o. daily. 9. Lovaza 1 g p.o. b.i.d. 10.Enteric-coated aspirin 81 mg one p.o. daily. 11.Vitamin D3 1000 units p.o. daily. 12.Vesicare 5 mg one p.o. daily.  ALLERGIES:  SULFA.  REVIEW OF SYSTEMS:  CONSTITUTIONAL:  Negative for fever.  Negative for chills.  Positive for weakness.  Positive for fatigue.  CNS:  No headaches or seizures.  No limb weakness.  ENT:  No nasal congestion. No throat pain.  No coryza.  CARDIOVASCULAR:  No chest pain.  No palpitations.  No orthopnea.  RESPIRATORY:  No  cough.  No shortness breath.  No wheezing.  GASTROINTESTINAL:  Positive for nausea.  Positive for vomiting.  Positive for abdominal pain.  Positive for constipation. Negative for diarrhea.  GENITOURINARY:  No dysuria.  No hematuria.  No urinary frequency.  RENAL:  No flank pain.  No swelling.  No pruritus. SKIN:  No rashes, sores, or lesions.  HEMATOLOGICAL:  No easy bruising. No purpura.  No clots.  LYMPH:  No lymphadenopathy.  No painful nodes or specific lymph swelling.  PSYCHIATRIC:  No anxiety.  No depression.  No insomnia.  FAMILY HISTORY:  Positive for stroke.  Positive for coronary artery disease.  PHYSICAL EXAMINATION:  VITAL SIGNS:  Temperature 98, pulse 80, respiratory rate 12, blood pressure 142/71, O2 sat 98% on 2 L. GENERAL:  The patient is awake, alert, and ordered x3.  She has an NG tube in place.  She is able to give a fair history. HEENT:  Eyes:  Pupils are equal, round, and reactive to light and accommodation.  External ocular movements bilaterally intact.  Sclerae nonicteric, noninjected.  Mouth:  Oral mucosa dry.  No lesions.  No sores.  Pharynx:  Clean.  No erythema.  No exudate. NECK:  Negative for JVD.  Negative for thyromegaly.  Negative for lymphadenopathy. HEART:  Regular rate and rhythm at 80 beats per minute without murmur, ectopy, or gallops.  No lateral PMI.  No thrills. LUNGS:  Clear to auscultation bilaterally without wheezes, rales, or rhonchi.  No increased work of breathing.  No tactile fremitus. ABDOMEN:  Slightly distended, soft, slightly tender whereas the patient calls it sore.  No hepatosplenomegaly.  No hernias palpated. EXTREMITIES:  Negative for cyanosis, clubbing, or edema.  The patient has somewhat diminished dorsalis pedis and popliteal pulses bilaterally. No carotid bruits bilaterally. NEUROLOGIC:  Cranial nerves II through XII are grossly intact.  Motor and sensory intact.  LABORATORY DATA:  WBC is 12.3, hemoglobin 14.5, hematocrit  43.3, platelets 310.  Sodium 139, potassium 3.4, chloride 95, CO2 of 32, BUN 53, creatinine 1.69, glucose 119, T-bili 0.4, alk phos 53.  Total protein 7.7, albumin 3.4, calcium 9.5.  Urinalysis shows ketones, small amount of bilirubin, negative nitrites, negative leukocytes.  CT abdomen and pelvis shows high grade small bowel obstruction likely secondary to adhesions and a trace of ascites.  ASSESSMENT: 1. High-grade small bowel obstruction likely secondary to adhesions. 2. Concern for gastrointestinal bleed with what sounds like coffee-     ground emesis and perhaps some hematemesis. 3. Hyperlipidemia. 4. Osteoarthritis. 5. History of colon cancer.  PLAN: 1. Consult Surgery.  Surgery has seen her and is involved. 2. IV fluids. 3. IV antibiotics for prophylaxis against transmigration of enteric     bacteria. 4. IV Protonix.  The patient has an NG tube to low intermittent     suction as ordered by Surgery and also has had an upper GI with     small-bowel followthrough as ordered by Surgery.  I have spent 42 minutes on this admission.          ______________________________ Karie Kirks, DO     AS/MEDQ  D:  11/15/2010  T:  11/16/2010  Job:  [id number]  cc:   Merrilee Seashore, M.D. Fax: [phone]  Electronically Signed by Karie Kirks DO on 11/26/2010 03:50:06 PM

## 2010-11-27 LAB — CBC
HCT: 26.6 % — ABNORMAL LOW (ref 36.0–46.0)
MCHC: 34.2 g/dL (ref 30.0–36.0)
MCV: 84.2 fL (ref 78.0–100.0)
Platelets: 375 10*3/uL (ref 150–400)
RDW: 15.9 % — ABNORMAL HIGH (ref 11.5–15.5)
WBC: 7.7 10*3/uL (ref 4.0–10.5)

## 2010-11-28 LAB — GLUCOSE, CAPILLARY: Glucose-Capillary: 101 mg/dL — ABNORMAL HIGH (ref 70–99)

## 2010-12-07 NOTE — Op Note (Signed)
  NAMETAMZIN, WASER NO.:  [id number]  MEDICAL RECORD NO.:  [id number]  LOCATION:  [id number]                         FACILITY:  [facility]  PHYSICIAN:  Sammuel Hines. Daiva Nakayama, M.D. DATE OF BIRTH:  Jul 07, 1925  DATE OF PROCEDURE:  11/21/2010 DATE OF DISCHARGE:                              OPERATIVE REPORT   PREOPERATIVE DIAGNOSIS:  Bowel obstruction.  POSTOPERATIVE DIAGNOSIS:  Bowel obstruction.  PROCEDURE:  Exploratory laparotomy and lysis of adhesions.  SURGEON:  Sammuel Hines. Daiva Nakayama, M.D.  ASSISTANT:  Lydia Guiles, P.A.  ANESTHESIA:  General endotracheal.  PROCEDURE:  After informed consent was obtained, the patient was brought to the operating room, placed in supine position on operating table. After adequate induction of general anesthesia, the patient's abdomen was prepped with ChloraPrep, allowed to dry and draped in usual sterile manner.  A midline incision was made with a #10 blade knife.  This incision was carried down through the skin and subcutaneous tissue sharply with electrocautery until the fascia of the anterior abdominal wall was encountered.  The fascia was opened sharply with electrocautery.  The preperitoneal space was then probed bluntly with blunt finger dissection.  We were able to identify some omentum and quite a bit of small bowel adherent to the anterior abdominal wall. This was taken down gently with sharp Metzenbaum dissection.  There were a lot of interloop adhesions as well and again we freed everything up with using sharp Metzenbaum dissection.  We were finally able to run the small bowel from the ligament of Treitz to the ileocecal valve.  In the process of doing all this, we did find a very tight adhesive band in the proximal small bowel where the bowel appeared to be obstructed.  We released this band and I believed this was the site of the obstruction. Couple of the areas that we lysed adhesions that was just a little raw surface  bleeding from the surface of the small intestine, so we controlled this with a 2-0 silk Lembert stitch.  Once the small bowel was all free and there was no more obstruction, we irrigated the abdomen with copious amounts of saline.  We then closed the abdomen with two running #1 double-stranded loop PDS sutures.  Subcutaneous tissue was irrigated with copious amounts of saline and Betadine and skin was closed with staples. Sterile dressings were applied.  Patient tolerated procedure well.  At the end of the case, all needle, sponge, and instrument counts were correct.  Patient was then awakened and taken to the Recovery in stable condition.     Sammuel Hines. Daiva Nakayama, M.D.     PST/MEDQ  D:  11/21/2010  T:  11/21/2010  Job:  [id number]  Electronically Signed by Autumn Messing III M.D. on 12/07/2010 09:08:44 AM

## 2010-12-10 NOTE — Consult Note (Signed)
[name], [name] NO.:  [id number]  MEDICAL RECORD NO.:  [id number]  LOCATION:  [id number]                         FACILITY[facility]  [facility]  PHYSICIAN:  [name], M.D.  DATE OF BIRTH:  1925/03/14  DATE OF CONSULTATION:                                CONSULTATION   REASON FOR CONSULTATION:  Small-bowel obstruction.  REQUESTING PHYSICIAN:  Beth Beers, MD  HISTORY OF PRESENT ILLNESS:  [name] is an 77 year old female who presents with about 4 days of complain of nausea and vomiting, unable to tolerate any p.o. intake.  She has also not had any flatus or bowel function for that same period of time.  She reports prior to the onset of her symptoms, she had been moving her bowels.  Her bowels tend to be on the looser side given her history of subtotal colectomy in the past. However, she states particularly they were darker in color, almost black.  They were not coffee-ground in appearance and she did not notice any hematochezia.  The vomiting has been mostly bilious or yellow.  She denies any hematemesis.  She states she has not had any fever, chills, myalgias.  She denies any chest pain, shortness of breath, dysuria.    She went and saw her primary care physician who thought this might be perhaps a little viral enteritis and gave her a liter of fluids to rehydrate her.  However, with worsening of her symptoms she presented today for further evaluation.  Here in [facility] Emergency Department she had a CT scan which shows evidence of bowel obstruction and therefore surgical consult is requested.  PAST MEDICAL HISTORY: 1. Hypertension. 2. Osteoarthritis. 3. Glaucoma. 4. Urinary incontinence. 5. Neuropathy.  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  FAMILY HISTORY:  Noncontributory at the present case.  SOCIAL HISTORY:  Patient lives at home.  She denies any tobacco, alcohol, or  illicit drug use.  MEDICATIONS:  Include VESIcare , aspirin, Betimol drops, Lumigan drops, Nexium, Lyrica, and hydrochlorothiazide.  ALLERGIES:  SULFA.  REVIEW OF SYSTEMS:  Please see history of illness for pertinent findings.  PHYSICAL EXAMINATION:  GENERAL:  Reveals an 77 year old female who is nontoxic and in no acute distress. CURRENT VITAL SIGNS:  Showed temperature of 98.7, heart rate of 83, blood pressure 178/91, respiratory rate of 18, oxygen saturation 100% on room air. ENT:  Unremarkable. NECK:  Supple without lymphadenopathy.  Trachea is midline.  No thyromegaly or masses. LUNGS:  Clear to auscultation.  No wheezes, rhonchi, or rales. HEART:  Regular rate and rhythm.  No murmurs, gallops, or rubs. Carotids 2+ and brisk without bruit. ABDOMEN:  Soft, but mildly distended.  Surgical scar is compatible with history.  No mass effect or hernias are appreciated.  The patient is nontender and demonstrates no peritoneal signs.  Bowel sounds are hypoactive. RECTAL:  Exam is deferred. SKIN:  Otherwise warm and dry with good turgor.  No rashes, lesions, or nodules noted. NEUROLOGIC:  The patient is alert and oriented x3.  DIAGNOSTICS:  CBC shows a white blood cell count of 12.3, hemoglobin of 14.5, hematocrit of 43.3, platelet  count of 310.  Metabolic panel shows a sodium of 139, potassium of 3.4, glucose of 95, CO2 of 32, BUN of 53, creatinine of 1.69, glucose of 119.  Liver enzymes within normal limits.  CT scan shows a dilated stomach and proximal small bowel leading to possible transition point followed by decompressed distal small bowel. No mass effect or evidence of ischemia is appreciated.  No hernias are found.  IMPRESSION:  Small-bowel obstruction likely secondary to adhesions.  PLAN:  I agree with admission for IV fluid hydration.  Suspect her white blood cell count is elevated secondary to dehydration.  We agree with NG tube placement and decompression for  complete bowel rest.  We will repeat x-rays in the morning to see if there has been any progression in addition to any symptomatic or clinical improvement.    I have had a discussion with the patient and the family regarding the possible need for surgery if no improvement is seen.  They understand and are in agreement with the plan.   Beth Dike, PA-C   [name], M.D., FACS  KB/MEDQ  D:  11/15/2010  T:  11/16/2010  Job:  [id number]  cc:   Beth Beers, MD  Electronically Signed by [name]  on 12/05/2010 03:51:00 PM Electronically Signed by [name] M.D. on 12/10/2010 10:10:25 PM

## 2010-12-12 NOTE — Discharge Summary (Addendum)
  [name], [name] NO.:  [id number]  MEDICAL RECORD NO.:  [id number]  LOCATION:  [id number]                         FACILITY:  [facility]  PHYSICIAN:  Irine Seal, MD    DATE OF BIRTH:  03/13/1925  DATE OF ADMISSION:  11/15/2010 DATE OF DISCHARGE:  11/28/2010                              DISCHARGE SUMMARY   ADDENDUM:  Please see full dictation by Jacquelynn Cree on October 2, job [id number].  Please see discharge summary of October 8 by Dr. Verneita Griffes.  Addendum to discharge summary dictated November 26, 2010.  DISCHARGE MEDICATIONS: 1. Enablex 7.5 mg p.o. daily at bedtime. 2. Lopressor 25 mg p.o. b.i.d. 3. Aspirin enteric-coated 81 mg p.o. daily. 4. Betimol eyedrops 0.5% one drop in both eyes every morning. 5. Lovaza 1 g two capsules p.o. daily. 6. Lumigan one drop in both eyes daily at bedtime. 7. Lyrica 50 mg two tabs p.o. daily. 8. Multivitamin one tab p.o. daily. 9. Nexium 40 mg p.o. daily. 10.Triamterene/hydrochlorothiazide 37.5/25 mg p.o. daily. 11.VESIcare 5 mg p.o. daily. 12.Vitamin B12 tablets p.o. three times a week. 13.Vitamin D3 1000 units p.o. daily.  PHYSICAL EXAM:  VITAL SIGNS:  Temp 98.2, blood pressure 136/88, heart rate 78, respiration 18, saturations 97% on room air. GENERAL:  Up in chair, alert, oriented, smiling. CV:  Regular rate and rhythm.  No murmur, gallop, or rub.  No lower extremity edema. RESPIRATORY:  Normal effort.  Breath sounds clear to auscultation bilaterally.  No rhonchi, wheezes, or rales. ABDOMEN:  Round, soft, positive bowel sounds throughout.  Nontender to palpation.  Staples intact.  No drainage, swelling, or erythema. NEUROLOGIC:  Alert and oriented x3.  Speech clear.  Facial symmetry.  FOLLOWUP:  Patient is to see Dr. Marlou Starks in 2-3 weeks.  [facility] [facility] to remove her staples on October 15.  The patient may shower and bathe, but no lifting for 6 weeks.  DISPOSITION:  The patient is medically stable and ready to be  discharged to SNF.     Radene Gunning, NP   ______________________________ Irine Seal, MD    KMB/MEDQ  D:  11/28/2010  T:  11/28/2010  Job:  [id number]  Electronically Signed by Dyanne Carrel  on 12/12/2010 03:47:08 PM Electronically Signed by Irine Seal MD on 12/15/2010 05:13:35 PM

## 2010-12-13 NOTE — Discharge Summary (Signed)
[name], [name] NO.:  [id number]  MEDICAL RECORD NO.:  [id number]  LOCATION:  46                         FACILITY:  [facility]  PHYSICIAN:  Verneita Griffes, MD        DATE OF BIRTH:  August 24, 1925  DATE OF ADMISSION:  11/15/2010 DATE OF DISCHARGE:                              DISCHARGE SUMMARY   Please see full dictation by Dr. Jacquelynn Cree, job [phone].  The patient continues to stay in the hospital.  Subsequent to this note, please note this note encompasses course from November 21, 2010, till date of discharge.  The patient was seen on November 21, 2010, and it was noted that there were plans for possible surgery given the fact that she had not passed flatus or any other bowel movement.  She noted she had some relative oliguria as a result of not being able to take p.o. and on discussion with Dr. Marlou Starks, it was noted that there are plans for surgery. The patient underwent on November 21, 2010, exploratory laparotomy with lysis of adhesions.  Please see separate operative note; however, it was noted that the patient had a very tight adhesive band in the proximal small bowel where the bowel appeared to be obstructed and this was released and this was thought to be the main source of obstruction.  A lot of omentum and quite a bit of small bowel per Dr. Ethlyn Gallery note it was adherent to anterior abdominal wall.  The patient did well subsequent to surgery and started passing flatus and loose stool subsequent to this on 6th, 7th and 8th October.  Ultimately, she will be graduated to a full diet tomorrow November 27, 2010, and likely will be just be discharged from a surgical standpoint.  1. Relative oliguria.  She was kept on IV fluids and TPN was started     on November 25, 2010.  This coincided with her return of  bowel     activity and she actually did pass stool.  As such, TPN has been on     hold today, but her PICC line that was placed has been kept on     standby and in  view of the fact that she may not tolerate a full     diet. 2. Hypertension.  The patient experienced the high blood pressures and     was kept on IV blood pressure medications and as such, her     medications were streamlined today to include regular triamterene     hydrochlorothiazide 37.5/25 and then metoprolol 12.5 b.i.d.  Her     blood pressures have still been slightly elevated today and we may     need to increase her blood pressure slightly-her elevated blood     pressure may be because of some amount of pain that she has. 3. Dyselectrolytemia. She had hypokalemia and hyponatremia likely     secondary to her poor p.o. intake, and this was replaced with IV     TPN.  These have all resolved secondary to TPN secondary     administration. 4. Anemia of blood loss.  Her hemoglobin today is 9.9 and this may be  a slight drop from anemic state versus hemodilution as she was on a     lot of IV fluid. 5. Leukocytosis.  The patient had a postop leukocytosis of up to 20.6,     and this subsequently trended down on its own. 6. Protein energy malnutrition.  Again, the patient's TPN has been on     hold.  She will likely need the PICC line discontinued if she     tolerates full diet and may need to be on micro nutrient     supplementation in addition to iron. 7. Glaucoma.  She will continue on her timolol. The patient was seen today and was doing well.  Temperature was 98.0, pulse 78, respirations 16, blood pressure 156 to 170 over 72 to 91, O2 sats 100% on room air.  She was cleared by physical therapy from their standpoint, to be discharged actually to home with 3:1 ratio and may be discharged home in the morning.  DISCHARGE MEDICATIONS:  Her discharge medications will be reconciled in the morning and likely, we will have to increase her metoprolol to 25 b.i.d.  A brief addendum will be done tomorrow with the discharge medications.          ______________________________ Verneita Griffes, MD     JS/MEDQ  D:  11/26/2010  T:  11/26/2010  Job:  [id number]  cc:   Merrilee Seashore, M.D. Fax: [place]. Daiva Nakayama, M.D. [id number] [address] [place] [address]  Electronically Signed by Verneita Griffes MD on 12/13/2010 05:25:03 AM

## 2010-12-17 ENCOUNTER — Encounter (INDEPENDENT_AMBULATORY_CARE_PROVIDER_SITE_OTHER): Payer: Self-pay | Admitting: General Surgery

## 2010-12-17 ENCOUNTER — Ambulatory Visit (INDEPENDENT_AMBULATORY_CARE_PROVIDER_SITE_OTHER): Payer: Medicare Other | Admitting: General Surgery

## 2010-12-17 VITALS — BP 138/82 | HR 64 | Temp 97.1°F | Resp 14 | Ht 64.0 in | Wt 142.6 lb

## 2010-12-17 DIAGNOSIS — K56609 Unspecified intestinal obstruction, unspecified as to partial versus complete obstruction: Secondary | ICD-10-CM | POA: Insufficient documentation

## 2010-12-17 NOTE — Progress Notes (Signed)
Subjective:     Patient ID: [name], female   DOB: 05-06-1925, 77 y.o.   MRN: [id number]  HPI The patient is a 77 year old white female who is now about a month out from a exploratory laparotomy and lysis of adhesions. She's doing very well today. Her only complaint is of some mild soreness on the right side of the abdomen. Otherwise her appetite is good and her bowels are working normally  Review of Systems     Objective:   Physical Exam On exam her abdomen is soft and nontender. Her incision is healing nicely. There is no palpable evidence for hernia. No distention or sign of obstruction.    Assessment:     One month postop from a lysis of adhesions    Plan:     At this point I would like her to refrain from lifting anything heavy for a couple more weeks. I will plan to see her back in one month to check her progress

## 2010-12-17 NOTE — Patient Instructions (Signed)
Do not lift more than 5 lbs for another 2 weeks

## 2011-01-17 DIAGNOSIS — R1031 Right lower quadrant pain: Secondary | ICD-10-CM | POA: Insufficient documentation

## 2011-01-17 HISTORY — DX: Right lower quadrant pain: R10.31

## 2011-01-24 ENCOUNTER — Encounter (INDEPENDENT_AMBULATORY_CARE_PROVIDER_SITE_OTHER): Payer: Medicare Other | Admitting: General Surgery

## 2011-01-25 ENCOUNTER — Encounter (INDEPENDENT_AMBULATORY_CARE_PROVIDER_SITE_OTHER): Payer: Medicare Other | Admitting: General Surgery

## 2011-02-22 ENCOUNTER — Ambulatory Visit (INDEPENDENT_AMBULATORY_CARE_PROVIDER_SITE_OTHER): Payer: Medicare Other | Admitting: General Surgery

## 2011-02-22 ENCOUNTER — Encounter (INDEPENDENT_AMBULATORY_CARE_PROVIDER_SITE_OTHER): Payer: Self-pay | Admitting: General Surgery

## 2011-02-22 VITALS — BP 130/72 | HR 70 | Temp 97.6°F | Resp 16 | Ht 64.0 in | Wt 151.6 lb

## 2011-02-22 DIAGNOSIS — K56609 Unspecified intestinal obstruction, unspecified as to partial versus complete obstruction: Secondary | ICD-10-CM

## 2011-02-22 NOTE — Patient Instructions (Signed)
May return to all normal activities 

## 2011-02-22 NOTE — Progress Notes (Signed)
Subjective:     Patient ID: [name], female   DOB: 21-May-1925, 78 y.o.   MRN: [id number]  HPI The patient is an 78 3O white female who is now about 2 months out from an exploratory laparotomy and lysis of adhesions for a bowel obstruction. She is doing well now. Since her last visit she's had no problems. Her appetite is good and her bowels are working normally. She does note an occasional episode of diarrhea but this was not sustained.  Review of Systems     Objective:   Physical Exam On exam her abdomen is soft and nontender. Her incision has healed nicely. There is no palpable evidence for hernia. There is no sign of infection.    Assessment:     2 months status post exploratory laparotomy and lysis of adhesions    Plan:     At this point I believe she can return to her normal activities without any restrictions. We will plan to see her back on a p.r.n. basis.

## 2012-05-06 ENCOUNTER — Other Ambulatory Visit: Payer: Self-pay | Admitting: Rheumatology

## 2012-05-21 ENCOUNTER — Encounter: Payer: Self-pay | Admitting: Internal Medicine

## 2012-05-21 ENCOUNTER — Non-Acute Institutional Stay (INDEPENDENT_AMBULATORY_CARE_PROVIDER_SITE_OTHER): Payer: Medicare Other | Admitting: Internal Medicine

## 2012-05-21 VITALS — BP 122/62 | HR 62 | Ht 63.5 in | Wt 172.0 lb

## 2012-05-21 DIAGNOSIS — M545 Low back pain, unspecified: Secondary | ICD-10-CM

## 2012-05-21 DIAGNOSIS — M25519 Pain in unspecified shoulder: Secondary | ICD-10-CM

## 2012-05-21 DIAGNOSIS — N318 Other neuromuscular dysfunction of bladder: Secondary | ICD-10-CM

## 2012-05-21 DIAGNOSIS — E785 Hyperlipidemia, unspecified: Secondary | ICD-10-CM

## 2012-05-21 DIAGNOSIS — R609 Edema, unspecified: Secondary | ICD-10-CM

## 2012-05-21 DIAGNOSIS — I10 Essential (primary) hypertension: Secondary | ICD-10-CM

## 2012-05-21 DIAGNOSIS — M2140 Flat foot [pes planus] (acquired), unspecified foot: Secondary | ICD-10-CM

## 2012-05-21 DIAGNOSIS — K219 Gastro-esophageal reflux disease without esophagitis: Secondary | ICD-10-CM

## 2012-05-23 ENCOUNTER — Encounter: Payer: Self-pay | Admitting: Internal Medicine

## 2012-05-23 DIAGNOSIS — M2141 Flat foot [pes planus] (acquired), right foot: Secondary | ICD-10-CM | POA: Insufficient documentation

## 2012-05-23 DIAGNOSIS — I10 Essential (primary) hypertension: Secondary | ICD-10-CM | POA: Insufficient documentation

## 2012-05-23 DIAGNOSIS — M545 Low back pain, unspecified: Secondary | ICD-10-CM | POA: Insufficient documentation

## 2012-05-23 DIAGNOSIS — E785 Hyperlipidemia, unspecified: Secondary | ICD-10-CM | POA: Insufficient documentation

## 2012-05-23 DIAGNOSIS — N318 Other neuromuscular dysfunction of bladder: Secondary | ICD-10-CM | POA: Insufficient documentation

## 2012-05-23 DIAGNOSIS — R609 Edema, unspecified: Secondary | ICD-10-CM | POA: Insufficient documentation

## 2012-05-23 DIAGNOSIS — K219 Gastro-esophageal reflux disease without esophagitis: Secondary | ICD-10-CM | POA: Insufficient documentation

## 2012-05-23 DIAGNOSIS — M25519 Pain in unspecified shoulder: Secondary | ICD-10-CM | POA: Insufficient documentation

## 2012-05-23 NOTE — Progress Notes (Signed)
Date: 05/23/2012  MRN:  [id number] Name:  [name] Sex:  female Age:  79 y.o. DOB:[birth year]   [facility] #:     [id number]                  Facility/Room; [facility] Level Of Care: Independent Provider: Charolette Forward  Emergency Contacts: Contact Information   Name Relation Home Work Mobile   [place] Daughter [phone]  [phone]      Code Status: DNR, LIVING WILL  Allergies: Allergies  Allergen Reactions  . Sulfa Antibiotics Itching     Chief Complaint  Patient presents with  . Medical Managment of Chronic Issues    new patient follow up -saw [facility] 03/05/12; Blood pressure, edema, arthritis     HPI: Patient Active Problem List  Diagnosis  . Flat feet : Patient denies any pain related to this problem.    . Lumbago: History of taking prednisone dose pack in the past. This gave her considerable relief. She has a nagging discomfort with this pain.    Marland Kitchen Hypertension: Well controlled on current medications.    . Pain in joint, shoulder region: Patient has been getting injections in the shoulder periodically fby her orthopedist since 2012   . 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 She says she urinates about 2-3 times per night. He has more frequent daytime urination related to using furosemide.    . Edema: Chronic edema of both legs with the left being more factor than the right. Present for years.    . Hyperlipidemia: Currently off all medication. She needs lab followup.    Marland Kitchen GERD (gastroesophageal reflux disease): Uses Nexium almost every day. Symptoms under good control.      Past Medical History  Diagnosis Date  . Senile osteoporosis   . Heart murmur   . Small bowel obstruction   . GERD (gastroesophageal reflux disease)   . Hypertension   . Glaucoma   . Arthritis     Osteoarthritis  . Personal history of fall   . Edema   . Hyperlipidemia   . Diverticulosis of colon (without mention of hemorrhage)   . Other malaise and fatigue   .  Hypertonicity of bladder   . Osteoarthrosis, unspecified whether generalized or localized, unspecified site   . Lumbago   . Edema   . Pain in joint, pelvic region and thigh   . Pain in joint, shoulder region   . Flat feet     Past Surgical History  Procedure Laterality Date  . Tonsillectomy    . Colon surgery  02/1996    Colectomy, partial for diverticular bleed 90%  . Joint replacement  12/2000    Left knee; Dr. Wynelle Link  . Breast lumpectomy Bilateral   . Laparoscopic lysis of adhesions  11/15/10    Exploratory     Procedures: 2000 EGD (Dr. Collene Mares): Esophagitis 2011 colonoscopy: diverticulosis 02/02/10 [facility]: Negative 02/26/11 mammogram negative 07/25/11 2-D echocardiogram: Ejection fraction 57% (Dr. Einar Gip)  Consultants: Rheumatology: Bo Merino Gen. surgery: Autumn Messing Podiatry: Amalia Hailey GYN: Romine GI: Hung/ Mann Hand surgery: Sypher Orthopedics: [facility] Cardiology: [facility] Rolm Bookbin[facility] Ronnald Ramp  Current Outpatient Prescriptions  Medication Sig Dispense Refill  . aspirin 81 MG tablet Take 81 mg by mouth daily.        Marland Kitchen BETIMOL 0.5 % ophthalmic solution       . Cholecalciferol (VITAMIN D3) 2000 UNITS TABS Take 2,000 Units by mouth daily.        Marland Kitchen  Cyanocobalamin (B-12) 2000 MCG TABS Take 2,000 mcg by mouth 3 (three) times a week.        . esomeprazole (NEXIUM) 40 MG capsule Take 40 mg by mouth daily.        . furosemide (LASIX) 40 MG tablet Take 40 mg by mouth. Take one tablet daily for edema      . LUMIGAN 0.01 % SOLN       . metoprolol tartrate (LOPRESSOR) 25 MG tablet       . Multiple Vitamin (MULTIVITAMIN) capsule Take 1 capsule by mouth daily.        . Omega-3 Fatty Acids (FISH OIL) 1200 MG CAPS Take 1,200 mg by mouth 2 (two) times daily.        . potassium chloride (K-DUR) 10 MEQ tablet Take 10 mEq by mouth. Take one tablet daily      . pregabalin (LYRICA)  50 MG capsule Take 50 mg by mouth 2 (two) times daily.        . VESICARE 5 MG tablet Daily.       No current facility-administered medications for this visit.    Immunization History  Administered Date(s) Administered  . Influenza Whole 11/19/2011  . Pneumococcal Polysaccharide 02/18/2009     Diet: regular  History  Substance Use Topics  . Smoking status: Former Smoker -- 0.50 packs/day for 30 years    Types: Cigarettes    Quit date: 12/16/1980  . Smokeless tobacco: Never Used  . Alcohol Use: 0.6 oz/week    1 Glasses of wine per week     Comment: occasional glass of wine    Family History  Problem Relation Age of Onset  . Cancer Father 79    colon      Constitutional: negative for chills, fevers, malaise, night sweats and weight loss Eyes: positive for contacts/glasses and glaucoma, negative for irritation, redness and visual disturbance Ears, nose, mouth, throat, and face: positive for hearing loss, negative for epistaxis, sore mouth and sore throat Respiratory: positive for dyspnea on exertion, negative for cough, pleurisy/chest pain and wheezing Cardiovascular: positive for murmur, negative for claudication, irregular heart beat, near-syncope, paroxysmal nocturnal dyspnea and syncope Gastrointestinal: negative for abdominal pain, change in bowel habits, constipation, diarrhea, dysphagia and odynophagia Genitourinary:positive for frequency and nocturia, negative for genital lesions, hot flashes and sexual problems Integument/breast: negative for rash Hematologic/lymphatic: negative Musculoskeletal:positive for arthralgias, back pain, myalgias and stiff joints, negative for bone pain Neurological: positive for memory problems, negative for dizziness, headaches, paresthesia, seizures, speech problems, tremors and vertigo Behavioral/Psych: negative for aggressive behavior, increased appetite, irritability, phobia  and sleep disturbance Endocrine:  negative Allergic/Immunologic: negative  Vital signs: BP 122/62  Pulse 62  Ht 5' 3.5" (1.613 m)  Wt 172 lb (78.019 kg)  BMI 29.99 kg/m2  BP 122/62  Pulse 62  Ht 5' 3.5" (1.613 m)  Wt 172 lb (78.019 kg)  BMI 29.99 kg/m2  General Appearance:    Alert, cooperative, no distress, appears stated age. Mildly over weight.   Head:    Normocephalic, without obvious abnormality, atraumatic  Eyes:    PERRL, conjunctiva/corneas clear, EOM's intact, fundi    benign, both eyes  Ears:    Normal TM's and external ear canals, both ears  Nose:   Nares normal, septum midline, mucosa normal, no drainage    or sinus tenderness  Throat:   Lips, mucosa, and tongue normal; dentures are present   Neck:   Supple, symmetrical, trachea midline, no adenopathy;  thyroid:  no enlargement/tenderness/nodules; no carotid   bruit or JVD  Back:     Symmetric, no curvature, ROM normal, no CVA tenderness  Lungs:     Clear to auscultation bilaterally, respirations unlabored  Chest Wall:    No tenderness or deformity   Heart:    Regular rate and rhythm, S1 and S2 normal, 3/6 murmur  Breast Exam:    No tenderness, masses, or nipple abnormality  Abdomen:     Soft, non-tender, bowel sounds active all four quadrants,    no masses, no organomegaly  Genitalia:    Normal female without lesion, discharge or tenderness  Rectal:    Normal tone, normal prostate, no masses or tenderness;   guaiac negative stool  Extremities:   Extremities normal, atraumatic, no cyanosis or edema  Pulses:   2+ and symmetric all extremities  Skin:   Skin color, texture, turgor normal, no rashes or lesions  Lymph nodes:   Cervical, supraclavicular, and axillary nodes normal  Neurologic:   CNII-XII intact, normal strength, sensation and reflexes    throughout    Screening Score  MMS    PHQ2    PHQ9     Fall Risk    BIMS    Annual summary: Hospitalizations: None  Infection History: None Functional assessment: Independent in all  ADL Areas of potential improvement: None likely Rehabilitation Potential: Not relevant Prognosis for survival: Good Plan:    . Lumbago: Continue to see orthopedist as needed. Try meloxicam 7.5 mg daily. Warned of possibility of irritation of the esophagus or stomach.   . Hypertension: Well controlled continue current medications.   . Pain in joint, shoulder region: Continue to see orthopedist as needed for injections.   . Hypertonicity of bladder: Continue past care.   . Edema: Mild. No additional treatment needed.   . Hyperlipidemia: Followup lipid panel   . GERD (gastroesophageal reflux disease): Continue Nexium. Advised her that she might try skipping every other day.    Return visit 3-4 months for office visit, lipid panel, BMP

## 2012-05-23 NOTE — Patient Instructions (Signed)
Continue current medications. 

## 2012-06-08 ENCOUNTER — Ambulatory Visit: Payer: Self-pay | Admitting: Internal Medicine

## 2012-07-18 ENCOUNTER — Inpatient Hospital Stay (HOSPITAL_COMMUNITY)
Admission: EM | Admit: 2012-07-18 | Discharge: 2012-07-20 | DRG: 291 | Disposition: A | Discharge status: Nursing Home (Includes ICF) | Payer: Medicare Other | Attending: Family Medicine | Admitting: Family Medicine

## 2012-07-18 ENCOUNTER — Emergency Department (HOSPITAL_COMMUNITY): Payer: Medicare Other

## 2012-07-18 ENCOUNTER — Encounter (HOSPITAL_COMMUNITY): Payer: Self-pay | Admitting: Emergency Medicine

## 2012-07-18 DIAGNOSIS — J9600 Acute respiratory failure, unspecified whether with hypoxia or hypercapnia: Secondary | ICD-10-CM

## 2012-07-18 DIAGNOSIS — I5031 Acute diastolic (congestive) heart failure: Principal | ICD-10-CM

## 2012-07-18 DIAGNOSIS — I248 Other forms of acute ischemic heart disease: Secondary | ICD-10-CM | POA: Diagnosis present

## 2012-07-18 DIAGNOSIS — J9601 Acute respiratory failure with hypoxia: Secondary | ICD-10-CM | POA: Diagnosis present

## 2012-07-18 DIAGNOSIS — N390 Urinary tract infection, site not specified: Secondary | ICD-10-CM

## 2012-07-18 DIAGNOSIS — R7989 Other specified abnormal findings of blood chemistry: Secondary | ICD-10-CM

## 2012-07-18 DIAGNOSIS — I2489 Other forms of acute ischemic heart disease: Secondary | ICD-10-CM | POA: Diagnosis present

## 2012-07-18 DIAGNOSIS — E785 Hyperlipidemia, unspecified: Secondary | ICD-10-CM

## 2012-07-18 DIAGNOSIS — R778 Other specified abnormalities of plasma proteins: Secondary | ICD-10-CM

## 2012-07-18 DIAGNOSIS — N318 Other neuromuscular dysfunction of bladder: Secondary | ICD-10-CM

## 2012-07-18 DIAGNOSIS — M545 Low back pain, unspecified: Secondary | ICD-10-CM

## 2012-07-18 DIAGNOSIS — M25519 Pain in unspecified shoulder: Secondary | ICD-10-CM

## 2012-07-18 DIAGNOSIS — I509 Heart failure, unspecified: Secondary | ICD-10-CM

## 2012-07-18 DIAGNOSIS — I5023 Acute on chronic systolic (congestive) heart failure: Secondary | ICD-10-CM

## 2012-07-18 DIAGNOSIS — R748 Abnormal levels of other serum enzymes: Secondary | ICD-10-CM | POA: Diagnosis present

## 2012-07-18 DIAGNOSIS — H409 Unspecified glaucoma: Secondary | ICD-10-CM | POA: Diagnosis present

## 2012-07-18 DIAGNOSIS — R609 Edema, unspecified: Secondary | ICD-10-CM

## 2012-07-18 DIAGNOSIS — K219 Gastro-esophageal reflux disease without esophagitis: Secondary | ICD-10-CM

## 2012-07-18 DIAGNOSIS — I10 Essential (primary) hypertension: Secondary | ICD-10-CM

## 2012-07-18 LAB — CBC WITH DIFFERENTIAL/PLATELET
Basophils Absolute: 0 10*3/uL (ref 0.0–0.1)
Basophils Relative: 0 % (ref 0–1)
Eosinophils Absolute: 0.1 10*3/uL (ref 0.0–0.7)
HCT: 34.6 % — ABNORMAL LOW (ref 36.0–46.0)
Hemoglobin: 10.7 g/dL — ABNORMAL LOW (ref 12.0–15.0)
MCH: 25.4 pg — ABNORMAL LOW (ref 26.0–34.0)
MCHC: 30.9 g/dL (ref 30.0–36.0)
Monocytes Absolute: 0.9 10*3/uL (ref 0.1–1.0)
Neutro Abs: 10.6 10*3/uL — ABNORMAL HIGH (ref 1.7–7.7)
RDW: 18.5 % — ABNORMAL HIGH (ref 11.5–15.5)

## 2012-07-18 LAB — CBC
MCV: 81.8 fL (ref 78.0–100.0)
Platelets: 222 10*3/uL (ref 150–400)
RBC: 4.28 MIL/uL (ref 3.87–5.11)
RDW: 18.4 % — ABNORMAL HIGH (ref 11.5–15.5)
WBC: 11.8 10*3/uL — ABNORMAL HIGH (ref 4.0–10.5)

## 2012-07-18 LAB — MAGNESIUM: Magnesium: 2 mg/dL (ref 1.5–2.5)

## 2012-07-18 LAB — COMPREHENSIVE METABOLIC PANEL
Albumin: 3.5 g/dL (ref 3.5–5.2)
BUN: 20 mg/dL (ref 6–23)
Calcium: 9.3 mg/dL (ref 8.4–10.5)
Creatinine, Ser: 1.22 mg/dL — ABNORMAL HIGH (ref 0.50–1.10)
GFR calc Af Amer: 45 mL/min — ABNORMAL LOW (ref >90)
Glucose, Bld: 217 mg/dL — ABNORMAL HIGH (ref 70–99)
Total Protein: 7.4 g/dL (ref 6.0–8.3)

## 2012-07-18 LAB — CREATININE, SERUM
Creatinine, Ser: 1.21 mg/dL — ABNORMAL HIGH (ref 0.50–1.10)
GFR calc Af Amer: 46 mL/min — ABNORMAL LOW (ref >90)
GFR calc non Af Amer: 40 mL/min — ABNORMAL LOW (ref >90)

## 2012-07-18 LAB — POCT I-STAT TROPONIN I: Troponin i, poc: 0.09 ng/mL (ref 0.00–0.08)

## 2012-07-18 LAB — TSH: TSH: 1.733 u[IU]/mL (ref 0.350–4.500)

## 2012-07-18 LAB — PRO B NATRIURETIC PEPTIDE: Pro B Natriuretic peptide (BNP): 2913 pg/mL — ABNORMAL HIGH (ref 0–450)

## 2012-07-18 LAB — MRSA PCR SCREENING: MRSA by PCR: NEGATIVE

## 2012-07-18 LAB — TROPONIN I: Troponin I: 0.38 ng/mL (ref <0.30)

## 2012-07-18 MED ORDER — POTASSIUM CHLORIDE CRYS ER 10 MEQ PO TBCR
10.0000 meq | EXTENDED_RELEASE_TABLET | Freq: Every day | ORAL | Status: DC
  Administered 2012-07-18 – 2012-07-20 (×3): 10 meq via ORAL
  Filled 2012-07-18 (×3): qty 1

## 2012-07-18 MED ORDER — HEPARIN SODIUM (PORCINE) 5000 UNIT/ML IJ SOLN
5000.0000 [IU] | Freq: Three times a day (TID) | INTRAMUSCULAR | Status: DC
  Administered 2012-07-18 – 2012-07-20 (×6): 5000 [IU] via SUBCUTANEOUS
  Filled 2012-07-18 (×9): qty 1

## 2012-07-18 MED ORDER — ACETAMINOPHEN 325 MG PO TABS: 650.0000 mg | ORAL_TABLET | ORAL | Status: DC | PRN

## 2012-07-18 MED ORDER — SODIUM CHLORIDE 0.9 % IJ SOLN: 3.0000 mL | INTRAMUSCULAR | Status: DC | PRN

## 2012-07-18 MED ORDER — LATANOPROST 0.005 % OP SOLN
1.0000 [drp] | Freq: Every day | OPHTHALMIC | Status: DC
  Administered 2012-07-18 – 2012-07-19 (×2): 1 [drp] via OPHTHALMIC
  Filled 2012-07-18: qty 2.5

## 2012-07-18 MED ORDER — SODIUM CHLORIDE 0.9 % IV SOLN: 250.0000 mL | INTRAVENOUS | Status: DC | PRN

## 2012-07-18 MED ORDER — NITROGLYCERIN 2 % TD OINT
1.0000 [in_us] | TOPICAL_OINTMENT | Freq: Four times a day (QID) | TRANSDERMAL | Status: DC
  Administered 2012-07-18: 1 [in_us] via TOPICAL
  Filled 2012-07-18: qty 30

## 2012-07-18 MED ORDER — TIMOLOL MALEATE 0.5 % OP SOLN
1.0000 [drp] | Freq: Every day | OPHTHALMIC | Status: DC
  Administered 2012-07-18 – 2012-07-20 (×3): 1 [drp] via OPHTHALMIC
  Filled 2012-07-18: qty 5

## 2012-07-18 MED ORDER — ONDANSETRON HCL 4 MG/2ML IJ SOLN: 4.0000 mg | Freq: Four times a day (QID) | INTRAMUSCULAR | Status: DC | PRN

## 2012-07-18 MED ORDER — SODIUM CHLORIDE 0.9 % IV SOLN
INTRAVENOUS | Status: DC
  Administered 2012-07-18: 20 mL/h via INTRAVENOUS

## 2012-07-18 MED ORDER — ASPIRIN 81 MG PO TABS: 81.0000 mg | ORAL_TABLET | Freq: Every day | ORAL | Status: DC

## 2012-07-18 MED ORDER — SODIUM CHLORIDE 0.9 % IJ SOLN
3.0000 mL | Freq: Two times a day (BID) | INTRAMUSCULAR | Status: DC
  Administered 2012-07-18 – 2012-07-20 (×5): 3 mL via INTRAVENOUS

## 2012-07-18 MED ORDER — ASPIRIN 81 MG PO CHEW
81.0000 mg | CHEWABLE_TABLET | Freq: Every day | ORAL | Status: DC
  Administered 2012-07-18 – 2012-07-20 (×3): 81 mg via ORAL
  Filled 2012-07-18 (×3): qty 1

## 2012-07-18 MED ORDER — TRAMADOL HCL 50 MG PO TABS
100.0000 mg | ORAL_TABLET | Freq: Two times a day (BID) | ORAL | Status: DC | PRN
  Administered 2012-07-20: 100 mg via ORAL
  Filled 2012-07-18: qty 2

## 2012-07-18 MED ORDER — OMEGA-3-ACID ETHYL ESTERS 1 G PO CAPS
1.0000 g | ORAL_CAPSULE | Freq: Two times a day (BID) | ORAL | Status: DC
  Administered 2012-07-18 – 2012-07-20 (×4): 1 g via ORAL
  Filled 2012-07-18 (×5): qty 1

## 2012-07-18 MED ORDER — ISOSORB DINITRATE-HYDRALAZINE 20-37.5 MG PO TABS
1.0000 | ORAL_TABLET | Freq: Three times a day (TID) | ORAL | Status: DC
  Administered 2012-07-18 – 2012-07-20 (×6): 1 via ORAL
  Filled 2012-07-18 (×8): qty 1

## 2012-07-18 MED ORDER — FUROSEMIDE 10 MG/ML IJ SOLN
40.0000 mg | Freq: Once | INTRAMUSCULAR | Status: AC
  Administered 2012-07-18: 40 mg via INTRAVENOUS
  Filled 2012-07-18: qty 4

## 2012-07-18 MED ORDER — PREGABALIN 50 MG PO CAPS
50.0000 mg | ORAL_CAPSULE | Freq: Two times a day (BID) | ORAL | Status: DC
  Administered 2012-07-18 – 2012-07-20 (×4): 50 mg via ORAL
  Filled 2012-07-18 (×4): qty 1

## 2012-07-18 MED ORDER — PANTOPRAZOLE SODIUM 40 MG PO TBEC
40.0000 mg | DELAYED_RELEASE_TABLET | Freq: Every day | ORAL | Status: DC
  Administered 2012-07-18 – 2012-07-20 (×3): 40 mg via ORAL
  Filled 2012-07-18 (×3): qty 1

## 2012-07-18 MED ORDER — FUROSEMIDE 10 MG/ML IJ SOLN
40.0000 mg | Freq: Two times a day (BID) | INTRAMUSCULAR | Status: DC
  Administered 2012-07-18 – 2012-07-19 (×3): 40 mg via INTRAVENOUS
  Filled 2012-07-18 (×6): qty 4

## 2012-07-18 MED ORDER — TIMOLOL HEMIHYDRATE 0.5 % OP SOLN: 1.0000 [drp] | Freq: Every morning | OPHTHALMIC | Status: DC

## 2012-07-18 NOTE — Progress Notes (Signed)
CRITICAL VALUE ALERT  Critical value received: Troponin .60  Date of notification:  07-18-12  Time of notification:  [id number]  Critical value read back:yes  Nurse who received alert:  Pam RN  MD notified (1st page):  Wendee Beavers  Time of first page:  1500  MD notified (2nd page):  Time of second page:  Responding MD:  Wendee Beavers  Time MD responded:  1500

## 2012-07-18 NOTE — ED Notes (Addendum)
Per EMS - Pt is from [facility] and endorses cough for several months. Cough became worse last night and this morning it was associated w/ extreme shortness of breath. Has received 10 mg Albuterol, 0.5 mg Atrovent enroute with some improvement. Remains congested. BP 197/115, HR - 96, Resp - 20, O2 SAT 96% on breathing treatment. #20 LAC saline lock. Pt w/o inspiratory or expiratory wheezing but coarse rales throughout all lung fields bilaterally.  Pt O2 SAT in the field was 70% prior to treamt

## 2012-07-18 NOTE — Care Management Note (Signed)
Cm spoke with patient at bedside concerning discharge planning. MD order for HF screen. Per pt HF classes provided at facility she resides in. Per pt currently residing at [facility]. Per pt PCP recently ordered Pt in which she did not start due to hospital admission but plans to resume the therapy post discharge. Per pt adult daughter Benjamine Mola assist with transportation. Pt states using a RW for home DME use. No other needs identified at this time.   Venita Lick Serinity Ware,RN,BSN [phone]

## 2012-07-18 NOTE — ED Notes (Signed)
[id number] Expected date:07/18/12<BR> Expected time: 9:09 AM<BR> Means of arrival:Ambulance<BR> Comments:<BR> Congestion/Shob

## 2012-07-18 NOTE — H&P (Addendum)
[facility] History and Physical  [name] [id number] DOB: [birth year] DOA: 07/18/2012  Referring physician: Dr. Zenia Resides PCP: Estill Dooms, MD  Specialists: None  Chief Complaint: SOB  HPI: [name] is a 79 y.o. female  With history of HTN, heart murmur, HPL that presents to the Ed complaining of SOB.  This has been going on for a few months per patient and has persisted and got worse.  She is not sure what makes it worse or better.  Last night reportedly her SOB got worse and patient decided to present to the ED this AM for further evaluation.  Initial evaluation showed patient was hypoxic on room air in the 70's and nursing reports patient's hypoxia got worse when patient layed down flat.  Patient denies any chest pain, diaphoresis, or nausea  In the Ed patient was found to have an elevated WBC of 12.8,   Review of Systems: 10 point review of system reviewed with patient and negative unless otherwise mentioned above.  Past Medical History  Diagnosis Date  . Senile osteoporosis   . Heart murmur   . Small bowel obstruction   . GERD (gastroesophageal reflux disease)   . Hypertension   . Glaucoma   . Arthritis     Osteoarthritis  . Personal history of fall   . Edema   . Hyperlipidemia   . Diverticulosis of colon (without mention of hemorrhage)   . Other malaise and fatigue   . Hypertonicity of bladder   . Osteoarthrosis, unspecified whether generalized or localized, unspecified site   . Lumbago   . Edema   . Pain in joint, pelvic region and thigh   . Pain in joint, shoulder region   . Flat feet    Past Surgical History  Procedure Laterality Date  . Tonsillectomy    . Colon surgery  02/1996    Colectomy, partial for diverticular bleed 90%  . Joint replacement  12/2000    Left knee; Dr. Wynelle Link  . Breast lumpectomy Bilateral   . Laparoscopic lysis of adhesions  11/15/10    Exploratory   Social History:  reports that she quit smoking about 31 years  ago. Her smoking use included Cigarettes. She has a 15 pack-year smoking history. She has never used smokeless tobacco. She reports that she drinks about 0.6 ounces of alcohol per week. She reports that she does not use illicit drugs.  Can patient participate in ADLs? yes  Allergies  Allergen Reactions  . Sulfa Antibiotics Itching    Family History  Problem Relation Age of Onset  . Cancer Father 69    colon    Prior to Admission medications   Medication Sig Start Date End Date Taking? Authorizing Provider  aspirin 81 MG tablet Take 81 mg by mouth daily.     Yes Historical Provider, MD  BETIMOL 0.5 % ophthalmic solution Place 1 drop into both eyes every morning.  09/23/10  Yes Historical Provider, MD  calcium carbonate (OS-CAL) 600 MG TABS Take 600 mg by mouth daily.   Yes Historical Provider, MD  Cholecalciferol (VITAMIN D3) 2000 UNITS TABS Take 2,000 Units by mouth daily.     Yes Historical Provider, MD  Cyanocobalamin (B-12) 2000 MCG TABS Take 2,000 mcg by mouth 3 (three) times a week. Monday, Wednesday, AND Friday   Yes Historical Provider, MD  esomeprazole (NEXIUM) 40 MG capsule Take 40 mg by mouth daily.     Yes Historical Provider, MD  furosemide (LASIX) 40 MG  tablet Take 40 mg by mouth. Take one tablet daily for edema   Yes Historical Provider, MD  latanoprost (XALATAN) 0.005 % ophthalmic solution 1 drop at bedtime.   Yes Historical Provider, MD  meloxicam (MOBIC) 15 MG tablet Take 15 mg by mouth daily.   Yes Historical Provider, MD  metoprolol tartrate (LOPRESSOR) 25 MG tablet Take 25 mg by mouth 2 (two) times daily.  11/29/10  Yes Historical Provider, MD  Misc Natural Products (TART CHERRY ADVANCED PO) Take 1 tablet by mouth daily.   Yes Historical Provider, MD  Multiple Vitamin (MULTIVITAMIN) capsule Take 1 capsule by mouth daily.     Yes Historical Provider, MD  Omega-3 Fatty Acids (FISH OIL) 1200 MG CAPS Take 1,200 mg by mouth 2 (two) times daily.     Yes Historical Provider, MD   potassium chloride (K-DUR) 10 MEQ tablet Take 10 mEq by mouth. Take one tablet daily   Yes Historical Provider, MD  pregabalin (LYRICA) 50 MG capsule Take 50 mg by mouth 2 (two) times daily.     Yes Historical Provider, MD  traMADol (ULTRAM) 50 MG tablet Take 50-100 mg by mouth 2 (two) times daily as needed for pain.   Yes Historical Provider, MD  TURMERIC PO Take 1 tablet by mouth daily.   Yes Historical Provider, MD  VESICARE 5 MG tablet Daily. 02/11/11  Yes Historical Provider, MD   Physical Exam: Filed Vitals:   07/18/12 [id number] 07/18/12 0951 07/18/12 0953 07/18/12 1007  BP:      Pulse:      Temp:    98.6 F (37 C)  TempSrc:    Rectal  Resp:      SpO2: 87% 90% 94%      General:  Pt in NAD, Alert and Awake  Eyes: EOMI, non icteric  ENT: normal exterior appearance, no masses on visual examination  Neck: supple, no goiter  Cardiovascular: RRR, No MRG  Respiratory: CTA BL, no wheezes  Abdomen: soft, NT, ND  Skin: warm and dry, edema at lower extremities BL  Musculoskeletal: no cyanosis or clubbing  Psychiatric: mood and affect appropriate  Neurologic: no focal findings on exam, answers questions appropriately and moves all extremities  Labs on Admission:  Basic Metabolic Panel:  Recent Labs Lab 07/18/12 0959  NA 139  K 4.2  CL 102  CO2 26  GLUCOSE 217*  BUN 20  CREATININE 1.22*  CALCIUM 9.3   Liver Function Tests:  Recent Labs Lab 07/18/12 0959  AST 23  ALT 12  ALKPHOS 60  BILITOT 0.6  PROT 7.4  ALBUMIN 3.5   No results found for this basename: LIPASE, AMYLASE,  in the last 168 hours No results found for this basename: AMMONIA,  in the last 168 hours CBC:  Recent Labs Lab 07/18/12 0959  WBC 12.8*  NEUTROABS 10.6*  HGB 10.7*  HCT 34.6*  MCV 82.2  PLT 179   Cardiac Enzymes: No results found for this basename: CKTOTAL, CKMB, CKMBINDEX, TROPONINI,  in the last 168 hours  BNP (last 3 results)  Recent Labs  07/18/12 0959  PROBNP  2913.0*   CBG: No results found for this basename: GLUCAP,  in the last 168 hours  Radiological Exams on Admission: Dg Chest 2 View  07/18/2012   *RADIOLOGY REPORT*  Clinical Data: Short of breath and wheezing.  CHEST - 2 VIEW  Comparison: 11/24/2010  Findings: There is hazy airspace opacity that projects along the central right upper lobe and in the medial  right lower lung zone. There is opacity that projects in the left lower lobe behind the cardiac silhouette.  Mild thickening is noted along the minor fissure.   There is evidence of minimal pleural effusions.  The cardiac silhouette is mildly enlarged.  No mediastinal or hilar masses.  No pneumothorax.  IMPRESSION: Hazy areas of lung opacity most evident in the right upper lobe centrally.  This may reflect asymmetric edema or areas of bilateral infiltrate.  Mild cardiomegaly and minimal effusions supports asymmetric edema.   Original Report Authenticated By: Lajean Manes, M.D.    EKG: Independently reviewed. Normal sinus rhythm with no ST elevations or depressions  Assessment/Plan Active Problems:   1. Acute respiratory failure - Most likely 2ary to acute CHF exacerbation given history and elevated BNP - provide supplemental oxygen - suspect improvement after patient is diuresed has received 40 mg of lasix in ED  2. Acute CHF exacerbation - not characterized at this point as such echocardiogram ordered. Patient denies any history of prior chf although was getting lasix for leg swelling. - fluid restriction and sodium restriction - avoid NSAIDs as such meloxicam not continued - telemetry monitoring, troponins, EKG next am Addendum: will also place on lasix 40 mg iv bid  3. GERD - stable continue home regimen  4. Chronic joint pain - discontinue meloxicam 2ary to 1 and 2 - ultram  5. DVT prophylaxis with heparin  Addendum: Elevated troponin most likely due to demand ischemia. Patient not complaining of chest pain and EKG is  reassuring.  Will repeat EKG in AM and obtain serial troponins.  Code Status: full code Family Communication: Discussed case with daughter. Disposition Plan: Pending improvement in condition  Time spent: > 60 minutes or critical care time  Velvet Bathe [facility] Pager [phone]  If 7PM-7AM, please contact night-coverage [email or web address] Password [facility] 07/18/2012, 11:53 AM

## 2012-07-18 NOTE — ED Provider Notes (Signed)
History     CSN: [id number]  Arrival date & time 07/18/12  0907   First MD Initiated Contact with Patient 07/18/12 [id number]      Chief Complaint  Patient presents with  . Cough  . Shortness of Breath    (Consider location/radiation/quality/duration/timing/severity/associated sxs/prior treatment) Patient is a 79 y.o. female presenting with cough and shortness of breath. The history is provided by the patient.  Cough Associated symptoms: shortness of breath   Shortness of Breath Associated symptoms: cough    patient here complaining of worsening cough times several months has been nonproductive but became worse last night. Some associated dyspnea but denies chest pain or chest pressure. Given albuterol with some improvement of her symptoms. No fever or chills. No black or bloody stools. Has not been seen by her Dr. for this recently. EMS noted that the patient's O2 sat was 70% prior to arrival and she was administered oxygen as well as a 10 mg albuterol treatment  Past Medical History  Diagnosis Date  . Senile osteoporosis   . Heart murmur   . Small bowel obstruction   . GERD (gastroesophageal reflux disease)   . Hypertension   . Glaucoma   . Arthritis     Osteoarthritis  . Personal history of fall   . Edema   . Hyperlipidemia   . Diverticulosis of colon (without mention of hemorrhage)   . Other malaise and fatigue   . Hypertonicity of bladder   . Osteoarthrosis, unspecified whether generalized or localized, unspecified site   . Lumbago   . Edema   . Pain in joint, pelvic region and thigh   . Pain in joint, shoulder region   . Flat feet     Past Surgical History  Procedure Laterality Date  . Tonsillectomy    . Colon surgery  02/1996    Colectomy, partial for diverticular bleed 90%  . Joint replacement  12/2000    Left knee; Dr. Wynelle Link  . Breast lumpectomy Bilateral   . Laparoscopic lysis of adhesions  11/15/10    Exploratory    Family History  Problem Relation  Age of Onset  . Cancer Father 65    colon    History  Substance Use Topics  . Smoking status: Former Smoker -- 0.50 packs/day for 30 years    Types: Cigarettes    Quit date: 12/16/1980  . Smokeless tobacco: Never Used  . Alcohol Use: 0.6 oz/week    1 Glasses of wine per week     Comment: occasional glass of wine    OB History   Grav Para Term Preterm Abortions TAB SAB Ect Mult Living                  Review of Systems  Respiratory: Positive for cough and shortness of breath.   All other systems reviewed and are negative.    Allergies  Sulfa antibiotics  Home Medications   Current Outpatient Rx  Name  Route  Sig  Dispense  Refill  . aspirin 81 MG tablet   Oral   Take 81 mg by mouth daily.           Marland Kitchen BETIMOL 0.5 % ophthalmic solution               . Cholecalciferol (VITAMIN D3) 2000 UNITS TABS   Oral   Take 2,000 Units by mouth daily.           . Cyanocobalamin (B-12) 2000 MCG TABS  Oral   Take 2,000 mcg by mouth 3 (three) times a week.           . esomeprazole (NEXIUM) 40 MG capsule   Oral   Take 40 mg by mouth daily.           . furosemide (LASIX) 40 MG tablet   Oral   Take 40 mg by mouth. Take one tablet daily for edema         . LUMIGAN 0.01 % SOLN               . metoprolol tartrate (LOPRESSOR) 25 MG tablet               . Multiple Vitamin (MULTIVITAMIN) capsule   Oral   Take 1 capsule by mouth daily.           . Omega-3 Fatty Acids (FISH OIL) 1200 MG CAPS   Oral   Take 1,200 mg by mouth 2 (two) times daily.           . potassium chloride (K-DUR) 10 MEQ tablet   Oral   Take 10 mEq by mouth. Take one tablet daily         . pregabalin (LYRICA) 50 MG capsule   Oral   Take 50 mg by mouth 2 (two) times daily.           . VESICARE 5 MG tablet      Daily.           BP 188/89  Pulse 89  Temp(Src) 97.8 F (36.6 C) (Oral)  Resp 20  SpO2 95%  Physical Exam  Nursing note and vitals  reviewed. Constitutional: She is oriented to person, place, and time. She appears well-developed and well-nourished.  Non-toxic appearance. No distress.  HENT:  Head: Normocephalic and atraumatic.  Eyes: Conjunctivae, EOM and lids are normal. Pupils are equal, round, and reactive to light.  Neck: Normal range of motion. Neck supple. No tracheal deviation present. No mass present.  Cardiovascular: Normal rate, regular rhythm and normal heart sounds.  Exam reveals no gallop.   No murmur heard. Pulmonary/Chest: Effort normal. No stridor. No respiratory distress. She has decreased breath sounds. She has wheezes. She has no rhonchi. She has no rales.  Abdominal: Soft. Normal appearance and bowel sounds are normal. She exhibits no distension. There is no tenderness. There is no rebound and no CVA tenderness.  Musculoskeletal: Normal range of motion. She exhibits no edema and no tenderness.  2 plus edema bilaterally  Neurological: She is alert and oriented to person, place, and time. She has normal strength. No cranial nerve deficit or sensory deficit. GCS eye subscore is 4. GCS verbal subscore is 5. GCS motor subscore is 6.  Skin: Skin is warm and dry. No abrasion and no rash noted.  Psychiatric: She has a normal mood and affect. Her speech is normal and behavior is normal.    ED Course  Procedures (including critical care time)  Labs Reviewed  PRO B NATRIURETIC PEPTIDE  CBC WITH DIFFERENTIAL  COMPREHENSIVE METABOLIC PANEL   No results found.   No diagnosis found.    MDM   Date: 07/18/2012  Rate: 78  Rhythm: normal sinus rhythm  QRS Axis: normal  Intervals: normal  ST/T Wave abnormalities: nonspecific ST changes  Conduction Disutrbances:none  Narrative Interpretation:   Old EKG Reviewed: none available   Pt given lasix for her CHF. Started on nitro paste as well. Will be admitted by [facility].  She has been assessed multiple times and continues to protect her  airway  CRITICAL CARE Performed by: Leota Jacobsen Total critical care time: 40 Critical care time was exclusive of separately billable procedures and treating other patients. Critical care was necessary to treat or prevent imminent or life-threatening deterioration. Critical care was time spent personally by me on the following activities: development of treatment plan with patient and/or surrogate as well as nursing, discussions with consultants, evaluation of patient's response to treatment, examination of patient, obtaining history from patient or surrogate, ordering and performing treatments and interventions, ordering and review of laboratory studies, ordering and review of radiographic studies, pulse oximetry and re-evaluation of patient's condition.   Leota Jacobsen, MD 07/18/12 1110

## 2012-07-19 DIAGNOSIS — K219 Gastro-esophageal reflux disease without esophagitis: Secondary | ICD-10-CM

## 2012-07-19 DIAGNOSIS — I059 Rheumatic mitral valve disease, unspecified: Secondary | ICD-10-CM

## 2012-07-19 DIAGNOSIS — I10 Essential (primary) hypertension: Secondary | ICD-10-CM

## 2012-07-19 DIAGNOSIS — E785 Hyperlipidemia, unspecified: Secondary | ICD-10-CM

## 2012-07-19 DIAGNOSIS — R7989 Other specified abnormal findings of blood chemistry: Secondary | ICD-10-CM

## 2012-07-19 DIAGNOSIS — I5031 Acute diastolic (congestive) heart failure: Principal | ICD-10-CM

## 2012-07-19 DIAGNOSIS — J9600 Acute respiratory failure, unspecified whether with hypoxia or hypercapnia: Secondary | ICD-10-CM

## 2012-07-19 DIAGNOSIS — R609 Edema, unspecified: Secondary | ICD-10-CM

## 2012-07-19 DIAGNOSIS — M25519 Pain in unspecified shoulder: Secondary | ICD-10-CM

## 2012-07-19 LAB — BASIC METABOLIC PANEL
BUN: 20 mg/dL (ref 6–23)
Calcium: 9.2 mg/dL (ref 8.4–10.5)
Chloride: 100 mEq/L (ref 96–112)
Creatinine, Ser: 1.3 mg/dL — ABNORMAL HIGH (ref 0.50–1.10)
GFR calc Af Amer: 42 mL/min — ABNORMAL LOW (ref >90)

## 2012-07-19 LAB — URINALYSIS, ROUTINE W REFLEX MICROSCOPIC
Glucose, UA: NEGATIVE mg/dL
Hgb urine dipstick: NEGATIVE
Ketones, ur: NEGATIVE mg/dL
Protein, ur: NEGATIVE mg/dL
Urobilinogen, UA: 0.2 mg/dL (ref 0.0–1.0)

## 2012-07-19 LAB — TROPONIN I: Troponin I: <0.3 ng/mL (ref <0.30)

## 2012-07-19 NOTE — Progress Notes (Signed)
  Echocardiogram 2D Echocardiogram has been performed.  [name] 07/19/2012, 9:55 AM

## 2012-07-19 NOTE — Progress Notes (Addendum)
Pt o2 stats on RA at rest 100%. With ambulation o2 ranges 92-96% on RA

## 2012-07-19 NOTE — Progress Notes (Signed)
[facility] PROGRESS NOTE  [name] [id number] DOB: 04-22-1925 DOA: 07/18/2012 PCP: Estill Dooms, MD  Assessment/Plan: 1. Acute respiratory failure - Most likely 2ary to acute CHF exacerbation given history and elevated BNP  - Will try to wean off of supplemental oxygen  - Continue diuretics with lasix. Currently patient has improved oxygen saturations  2. Acute CHF exacerbation  - not characterized at this point as such echocardiogram ordered. Patient denies any history of prior chf although was getting lasix for leg swelling.  - fluid restriction and sodium restriction  - avoid NSAIDs as such meloxicam not continued  - No red flags on telemetry reported. Troponins trending down.  Echo pending. - Lasix will be continued as 40 mg IV BID - due to renal insufficiency will hold off on ACE I at this time.  3. Elevated Troponin - Likely due to demand ischemia - Troponin trending down. No ST elevations or depressions on new EKG this am - Patient is chest pain free and troponin has normalized  4. GERD  - stable continue home regimen   5. Chronic joint pain  - discontinue meloxicam 2ary to 1 and 2  - ultram   6. DVT prophylaxis with heparin    Code Status: full Family Communication: No family at bedside.  Disposition Plan: Pending continued improvement in condition.   Consultants:  None  Procedures:  Echocardiogram  Antibiotics:  None  HPI/Subjective: No new complaints at this juncture.  No acute issues overnight. Patient reports improvement in breathing condition.  Objective: Filed Vitals:   07/18/12 1308 07/18/12 1458 07/18/12 2038 07/19/12 0635  BP: 149/72 135/64 114/64 109/56  Pulse: 71 71 70 66  Temp: 97.8 F (36.6 C) 97.8 F (36.6 C) 99 F (37.2 C) 98.4 F (36.9 C)  TempSrc: Oral Oral Oral Oral  Resp: 22 20 20 16   Height:      Weight:    74.6 kg (164 lb 7.4 oz)  SpO2: 98% 100% 98% 98%    Intake/Output Summary (Last 24 hours) at  07/19/12 0916 Last data filed at 07/19/12 0836  Gross per 24 hour  Intake   1083 ml  Output   3100 ml  Net  -2017 ml   Filed Weights   07/18/12 1307 07/19/12 0635  Weight: 74.118 kg (163 lb 6.4 oz) 74.6 kg (164 lb 7.4 oz)    Exam:   General:  Pt in NaD, Alert and Awake  Cardiovascular: RRR, No MRG  Respiratory: CTA BL, no wheezes  Abdomen: soft, NT, ND  Musculoskeletal: no cyanosis or clubbing  Skin: non pitting edema BL lower extremities   Data Reviewed: Basic Metabolic Panel:  Recent Labs Lab 07/18/12 0959 07/18/12 1406 07/19/12 0115  NA 139  --  139  K 4.2  --  3.4*  CL 102  --  100  CO2 26  --  28  GLUCOSE 217*  --  108*  BUN 20  --  20  CREATININE 1.22* 1.21* 1.30*  CALCIUM 9.3  --  9.2  MG  --  2.0  --    Liver Function Tests:  Recent Labs Lab 07/18/12 0959  AST 23  ALT 12  ALKPHOS 60  BILITOT 0.6  PROT 7.4  ALBUMIN 3.5   No results found for this basename: LIPASE, AMYLASE,  in the last 168 hours No results found for this basename: AMMONIA,  in the last 168 hours CBC:  Recent Labs Lab 07/18/12 0959 07/18/12 1406  WBC 12.8*  11.8*  NEUTROABS 10.6*  --   HGB 10.7* 11.2*  HCT 34.6* 35.0*  MCV 82.2 81.8  PLT 179 222   Cardiac Enzymes:  Recent Labs Lab 07/18/12 1406 07/18/12 1915 07/19/12 0115  TROPONINI 0.60* 0.38* <0.30   BNP (last 3 results)  Recent Labs  07/18/12 0959  PROBNP 2913.0*   CBG: No results found for this basename: GLUCAP,  in the last 168 hours  Recent Results (from the past 240 hour(s))  MRSA PCR SCREENING     Status: None   Collection Time    07/18/12  1:35 PM      Result Value Range Status   MRSA by PCR NEGATIVE  NEGATIVE Final   Comment:            The GeneXpert MRSA Assay (FDA     approved for NASAL specimens     only), is one component of a     comprehensive MRSA colonization     surveillance program. It is not     intended to diagnose MRSA     infection nor to guide or     monitor treatment  for     MRSA infections.     Studies: Dg Chest 2 View  07/18/2012   *RADIOLOGY REPORT*  Clinical Data: Short of breath and wheezing.  CHEST - 2 VIEW  Comparison: 11/24/2010  Findings: There is hazy airspace opacity that projects along the central right upper lobe and in the medial right lower lung zone. There is opacity that projects in the left lower lobe behind the cardiac silhouette.  Mild thickening is noted along the minor fissure.   There is evidence of minimal pleural effusions.  The cardiac silhouette is mildly enlarged.  No mediastinal or hilar masses.  No pneumothorax.  IMPRESSION: Hazy areas of lung opacity most evident in the right upper lobe centrally.  This may reflect asymmetric edema or areas of bilateral infiltrate.  Mild cardiomegaly and minimal effusions supports asymmetric edema.   Original Report Authenticated By: Lajean Manes, M.D.    Scheduled Meds: . aspirin  81 mg Oral Daily  . furosemide  40 mg Intravenous BID  . heparin  5,000 Units Subcutaneous Q8H  . isosorbide-hydrALAZINE  1 tablet Oral TID  . latanoprost  1 drop Both Eyes QHS  . omega-3 acid ethyl esters  1 g Oral BID  . pantoprazole  40 mg Oral Daily  . potassium chloride  10 mEq Oral Daily  . pregabalin  50 mg Oral BID  . sodium chloride  3 mL Intravenous Q12H  . timolol  1 drop Both Eyes Daily   Continuous Infusions:   Principal Problem:   Acute respiratory failure Active Problems:   Hypertension   Pain in joint, shoulder region   Hyperlipidemia   GERD (gastroesophageal reflux disease)   Acute exacerbation of CHF (congestive heart failure)   Elevated troponin    Time spent: > 40 minutes    [name], [facility] Pager [phone]. If 7PM-7AM, please contact night-coverage at [email or web address], password [facility] 07/19/2012, 9:16 AM  LOS: 1 day

## 2012-07-20 DIAGNOSIS — N390 Urinary tract infection, site not specified: Secondary | ICD-10-CM

## 2012-07-20 LAB — BASIC METABOLIC PANEL
CO2: 30 mEq/L (ref 19–32)
Calcium: 9 mg/dL (ref 8.4–10.5)
Creatinine, Ser: 1.73 mg/dL — ABNORMAL HIGH (ref 0.50–1.10)
GFR calc non Af Amer: 26 mL/min — ABNORMAL LOW (ref >90)
Sodium: 140 mEq/L (ref 135–145)

## 2012-07-20 MED ORDER — CIPROFLOXACIN HCL 500 MG PO TABS
500.0000 mg | ORAL_TABLET | Freq: Every day | ORAL | Status: DC
  Administered 2012-07-20: 500 mg via ORAL
  Filled 2012-07-20: qty 1

## 2012-07-20 MED ORDER — FUROSEMIDE 20 MG PO TABS: 20.0000 mg | ORAL_TABLET | Freq: Every day | ORAL | Status: DC

## 2012-07-20 MED ORDER — CARVEDILOL 3.125 MG PO TABS: 3.1250 mg | ORAL_TABLET | Freq: Two times a day (BID) | ORAL | Status: DC

## 2012-07-20 MED ORDER — POTASSIUM CHLORIDE ER 10 MEQ PO TBCR: 10.0000 meq | EXTENDED_RELEASE_TABLET | Freq: Every day | ORAL | Status: DC

## 2012-07-20 MED ORDER — CARVEDILOL 3.125 MG PO TABS
3.1250 mg | ORAL_TABLET | Freq: Two times a day (BID) | ORAL | Status: DC
  Filled 2012-07-20 (×2): qty 1

## 2012-07-20 NOTE — Discharge Summary (Signed)
Physician Discharge Summary  [name] [id number] DOB: 11/04/1925 DOA: 07/18/2012  PCP: Estill Dooms, MD  Admit date: 07/18/2012 Discharge date: 07/20/2012  Time spent: > 35 minutes  Recommendations for Outpatient Follow-up:  1. Please be sure to follow up with your pcp in 1-2 weeks 2. - Also follow up with creatinine levels.  Discharge Diagnoses:  Principal Problem:   Acute respiratory failure Active Problems:   Hypertension   Pain in joint, shoulder region   Hyperlipidemia   GERD (gastroesophageal reflux disease)   Acute exacerbation of CHF (congestive heart failure)   Elevated troponin   UTI (urinary tract infection)   Discharge Condition: Full  Diet recommendation: low sodium heart healthy  Filed Weights   07/18/12 1307 07/19/12 0635 07/20/12 0644  Weight: 74.118 kg (163 lb 6.4 oz) 74.6 kg (164 lb 7.4 oz) 74 kg (163 lb 2.3 oz)    History of present illness:  79 y/o with history of HTN, heart murmur, HPL presenting to the ED complaining of SOB.  Hospital Course:   1. Acute respiratory failure - Most likely 2ary to acute CHF exacerbation given history and elevated BNP  - Will try to wean off of supplemental oxygen  - Continue diuretics with lasix but given elevation in bun and creatinine will prescribe low dose and hold for the next 3 days.   - Patient is to follow up with PCP for further evaluation and recommendations.  2. Acute diastolic CHF exacerbation  - Echocardiogram shows a grade II diastolic dysfunction - fluid restriction and sodium restriction  - avoid NSAIDs as such meloxicam not continued  - No red flags on telemetry reported. Troponins within normal limits on last check - Lasix will be continued as 20 mg po daily starting 07/23/12 - due to renal insufficiency will hold off on ACE I at this time.   3. Elevated Troponin  - Likely due to demand ischemia  - Resolved  4. GERD  - stable continue home regimen   5. Chronic joint pain  -  discontinue meloxicam 2ary to 1 and 2  - ultram   6. HPL - continue lovaza - continue aspirin   Procedures:  Echocardiogram  Consultations:  none  Discharge Exam: Filed Vitals:   07/19/12 1034 07/19/12 1403 07/19/12 2059 07/20/12 0644  BP:  108/52 98/58 115/65  Pulse:  78 76 65  Temp:  98.4 F (36.9 C) 98.1 F (36.7 C) 97.9 F (36.6 C)  TempSrc:  Oral Oral Oral  Resp:  18 16 20   Height:      Weight:    74 kg (163 lb 2.3 oz)  SpO2: 100% 100% 96% 94%    General: Pt in NAD, Alert and Oriented Cardiovascular: RRR, No MRG Respiratory: CTA BL, no wheezes  Discharge Instructions  Discharge Orders   Future Appointments Provider Department Dept Phone   09/10/2012 1:30 PM Estill Dooms, MD [place] [phone]   Future Orders Complete By Expires     Call MD for:  difficulty breathing, headache or visual disturbances  As directed     Call MD for:  persistant nausea and vomiting  As directed     Call MD for:  redness, tenderness, or signs of infection (pain, swelling, redness, odor or green/yellow discharge around incision site)  As directed     Call MD for:  temperature >100.4  As directed     Diet - low sodium heart healthy  As directed     Discharge  instructions  As directed     Comments:      Please be sure to follow up with your primary care physician for further monitoring and recommendations.    Increase activity slowly  As directed         Medication List    STOP taking these medications       meloxicam 15 MG tablet  Commonly known as:  MOBIC     metoprolol tartrate 25 MG tablet  Commonly known as:  LOPRESSOR     TART CHERRY ADVANCED PO     TURMERIC PO      TAKE these medications       aspirin 81 MG tablet  Take 81 mg by mouth daily.     B-12 2000 MCG Tabs  Take 2,000 mcg by mouth 3 (three) times a week. Monday, Wednesday, AND Friday     BETIMOL 0.5 % ophthalmic solution  Generic drug:  timolol  Place 1 drop into both eyes every  morning.     calcium carbonate 600 MG Tabs  Commonly known as:  OS-CAL  Take 600 mg by mouth daily.     carvedilol 3.125 MG tablet  Commonly known as:  COREG  Take 1 tablet (3.125 mg total) by mouth 2 (two) times daily with a meal.     esomeprazole 40 MG capsule  Commonly known as:  NEXIUM  Take 40 mg by mouth daily.     Fish Oil 1200 MG Caps  Take 1,200 mg by mouth 2 (two) times daily.     furosemide 20 MG tablet  Commonly known as:  LASIX  Take 1 tablet (20 mg total) by mouth daily.  Start taking on:  07/23/2012     latanoprost 0.005 % ophthalmic solution  Commonly known as:  XALATAN  1 drop at bedtime.     multivitamin capsule  Take 1 capsule by mouth daily.     potassium chloride 10 MEQ tablet  Commonly known as:  K-DUR  Take 1 tablet (10 mEq total) by mouth daily. Take one tablet daily  Start taking on:  07/23/2012     pregabalin 50 MG capsule  Commonly known as:  LYRICA  Take 50 mg by mouth 2 (two) times daily.     traMADol 50 MG tablet  Commonly known as:  ULTRAM  Take 50-100 mg by mouth 2 (two) times daily as needed for pain.     VESICARE 5 MG tablet  Generic drug:  solifenacin  Daily.     Vitamin D3 2000 UNITS Tabs  Take 2,000 Units by mouth daily.       Allergies  Allergen Reactions  . Sulfa Antibiotics Itching      The results of significant diagnostics from this hospitalization (including imaging, microbiology, ancillary and laboratory) are listed below for reference.    Significant Diagnostic Studies: Dg Chest 2 View  07/18/2012   *RADIOLOGY REPORT*  Clinical Data: Short of breath and wheezing.  CHEST - 2 VIEW  Comparison: 11/24/2010  Findings: There is hazy airspace opacity that projects along the central right upper lobe and in the medial right lower lung zone. There is opacity that projects in the left lower lobe behind the cardiac silhouette.  Mild thickening is noted along the minor fissure.   There is evidence of minimal pleural effusions.   The cardiac silhouette is mildly enlarged.  No mediastinal or hilar masses.  No pneumothorax.  IMPRESSION: Hazy areas of lung opacity most evident in the  right upper lobe centrally.  This may reflect asymmetric edema or areas of bilateral infiltrate.  Mild cardiomegaly and minimal effusions supports asymmetric edema.   Original Report Authenticated By: Lajean Manes, M.D.    Microbiology: Recent Results (from the past 240 hour(s))  MRSA PCR SCREENING     Status: None   Collection Time    07/18/12  1:35 PM      Result Value Range Status   MRSA by PCR NEGATIVE  NEGATIVE Final   Comment:            The GeneXpert MRSA Assay (FDA     approved for NASAL specimens     only), is one component of a     comprehensive MRSA colonization     surveillance program. It is not     intended to diagnose MRSA     infection nor to guide or     monitor treatment for     MRSA infections.     Labs: Basic Metabolic Panel:  Recent Labs Lab 07/18/12 0959 07/18/12 1406 07/19/12 0115 07/20/12 0440  NA 139  --  139 140  K 4.2  --  3.4* 3.8  CL 102  --  100 98  CO2 26  --  28 30  GLUCOSE 217*  --  108* 99  BUN 20  --  20 26*  CREATININE 1.22* 1.21* 1.30* 1.73*  CALCIUM 9.3  --  9.2 9.0  MG  --  2.0  --   --    Liver Function Tests:  Recent Labs Lab 07/18/12 0959  AST 23  ALT 12  ALKPHOS 60  BILITOT 0.6  PROT 7.4  ALBUMIN 3.5   No results found for this basename: LIPASE, AMYLASE,  in the last 168 hours No results found for this basename: AMMONIA,  in the last 168 hours CBC:  Recent Labs Lab 07/18/12 0959 07/18/12 1406  WBC 12.8* 11.8*  NEUTROABS 10.6*  --   HGB 10.7* 11.2*  HCT 34.6* 35.0*  MCV 82.2 81.8  PLT 179 222   Cardiac Enzymes:  Recent Labs Lab 07/18/12 1406 07/18/12 1915 07/19/12 0115  TROPONINI 0.60* 0.38* <0.30   BNP: BNP (last 3 results)  Recent Labs  07/18/12 0959  PROBNP 2913.0*   CBG: No results found for this basename: GLUCAP,  in the last 168  hours     Signed:  Velvet Bathe  [facility] 07/20/2012, 1:53 PM

## 2012-07-20 NOTE — Progress Notes (Signed)
ANTIBIOTIC CONSULT NOTE - INITIAL  Pharmacy Consult for Cipro Indication: UTI  Allergies  Allergen Reactions  . Sulfa Antibiotics Itching    Patient Measurements: Height: 5\' 4"  (162.6 cm) Weight: 163 lb 2.3 oz (74 kg) IBW/kg (Calculated) : 54.7  Vital Signs: Temp: 97.9 F (36.6 C) (06/02 0644) Temp src: Oral (06/02 0644) BP: 115/65 mmHg (06/02 0644) Pulse Rate: 65 (06/02 0644) Intake/Output from previous day: 06/01 0701 - 06/02 0700 In: [id number] [P.O.:1120; I.V.:3] Out: 1280 [Urine:1280] Intake/Output from this shift:    Labs:  Recent Labs  07/18/12 0959 07/18/12 1406 07/19/12 0115 07/20/12 0440  WBC 12.8* 11.8*  --   --   HGB 10.7* 11.2*  --   --   PLT 179 222  --   --   CREATININE 1.22* 1.21* 1.30* 1.73*   Estimated Creatinine Clearance: 23.4 ml/min (by C-G formula based on Cr of 1.73). No results found for this basename: VANCOTROUGH, Corlis Leak, VANCORANDOM, [place], GENTPEAK, GENTRANDOM, TOBRATROUGH, TOBRAPEAK, TOBRARND, AMIKACINPEAK, AMIKACINTROU, AMIKACIN,  in the last 72 hours   Microbiology: Recent Results (from the past 720 hour(s))  MRSA PCR SCREENING     Status: None   Collection Time    07/18/12  1:35 PM      Result Value Range Status   MRSA by PCR NEGATIVE  NEGATIVE Final   Comment:            The GeneXpert MRSA Assay (FDA     approved for NASAL specimens     only), is one component of a     comprehensive MRSA colonization     surveillance program. It is not     intended to diagnose MRSA     infection nor to guide or     monitor treatment for     MRSA infections.    Medical History: Past Medical History  Diagnosis Date  . Senile osteoporosis   . Heart murmur   . Small bowel obstruction   . GERD (gastroesophageal reflux disease)   . Hypertension   . Glaucoma   . Arthritis     Osteoarthritis  . Personal history of fall   . Edema   . Hyperlipidemia   . Diverticulosis of colon (without mention of hemorrhage)   . Other malaise and  fatigue   . Hypertonicity of bladder   . Osteoarthrosis, unspecified whether generalized or localized, unspecified site   . Lumbago   . Edema   . Pain in joint, pelvic region and thigh   . Pain in joint, shoulder region   . Flat feet      Assessment: 75 yoF admitted with acute respiratory failure most likely 2/2 acute CHF exacerbation.  Will start empiric treatment for UTI today with Cipro.  SCr increasing, CrCl~23 ml/min currently.  Patient has received Lasix 40 mg IV BID x 2 days.  Reduced to 20 mg PO daily, starting 6/3.  No doses scheduled for today.  Afebrile, WBC 11.8 on admission (5/31)  Urine culture ordered 6/1  Goal of Therapy:  doses adjusted per renal clearance  Plan:  Cipro 500 mg PO q24h. F/u SCr for dose adjustments.  Hershal Coria 07/20/2012,8:43 AM

## 2012-07-20 NOTE — Care Management Note (Signed)
    Page 1 of 1   07/20/2012     4:36:41 PM   CARE MANAGEMENT NOTE 07/20/2012  Patient:  [name], [name]   Account Number:  [id number]  Date Initiated:  07/19/2012  Documentation initiated by:  Allene Dillon  Subjective/Objective Assessment:   79 year old female admitted with SOB.     Action/Plan:   Lives at [facility].   Anticipated DC Date:  07/22/2012   Anticipated DC Plan:  [place]  CM consult      Choice offered to / List presented to:             Status of service:  Completed, signed off Medicare Important Message given?  NA - LOS <3 / Initial given by admissions (If response is "NO", the following Medicare IM given date fields will be blank) Date Medicare IM given:   Date Additional Medicare IM given:    Discharge Disposition:  HOME/SELF CARE  Per UR Regulation:  Reviewed for med. necessity/level of care/duration of stay  If discussed at Long Length of Stay Meetings, dates discussed:    Comments:  07/20/12 Shadee Rathod RN,BSN NCM [phone] D/C RETURNING TO INDEP LIV.

## 2012-07-23 ENCOUNTER — Non-Acute Institutional Stay: Payer: Medicare Other | Admitting: Nurse Practitioner

## 2012-07-23 ENCOUNTER — Encounter: Payer: Self-pay | Admitting: Nurse Practitioner

## 2012-07-23 VITALS — BP 168/82 | HR 72 | Temp 97.7°F | Ht 64.0 in | Wt 161.0 lb

## 2012-07-23 DIAGNOSIS — K219 Gastro-esophageal reflux disease without esophagitis: Secondary | ICD-10-CM

## 2012-07-23 DIAGNOSIS — I10 Essential (primary) hypertension: Secondary | ICD-10-CM

## 2012-07-23 DIAGNOSIS — I509 Heart failure, unspecified: Secondary | ICD-10-CM

## 2012-07-23 DIAGNOSIS — M545 Low back pain, unspecified: Secondary | ICD-10-CM

## 2012-07-23 DIAGNOSIS — I5031 Acute diastolic (congestive) heart failure: Secondary | ICD-10-CM

## 2012-07-23 LAB — URINE CULTURE: Colony Count: >=100000

## 2012-07-24 NOTE — Assessment & Plan Note (Signed)
Stable on Nexium 40 mg. 

## 2012-07-24 NOTE — Assessment & Plan Note (Signed)
BLE edema and SOB much improved after Lasix started while in hospital for elevated BNP and dependent edema. Now the patient is instructed to weight herself daily and call for wt gain 2-3 Ibs/24hrs or 3-5 Ibs/week.

## 2012-07-24 NOTE — Assessment & Plan Note (Signed)
Pain is managed with Lyrica 50mg  bid and Tramadol 50-100mg  bid prn.

## 2012-07-24 NOTE — Assessment & Plan Note (Addendum)
Elevated SBp--takes Carvedilol 3.125mg , Furosemide 20mg -monitor Bps, ACEI was held dueto elevated Bun/creat while in hospital. May resume it if elevated Bp persists and Bun/creat improves.

## 2012-07-24 NOTE — Progress Notes (Signed)
Patient ID: [name], female   DOB: [birth year], 79 y.o.   MRN: [id number]   Allergies  Allergen Reactions  . Sulfa Antibiotics Itching    Chief Complaint  Patient presents with  . Hospitalization Follow-up    was in hospital 06/20/12 to 07/20/12 for acute respiratory failure, went to ER for shortness of breath.     HPI: Patient is a 79 y.o. female seen in the clinic at [facility] today for CHF, HTN, GERD, and lower back/leg pain  Hospitalized 07/18/12-07/20/12 for Acute respiratory failure. Most likely 2ary to acute CHF exacerbation given history and elevated BNP-Furosemide 20mg  daily started. Echocardiogram shows a grade II diastolic dysfunction Troponins within normal limits on last check-elevated Troponin likely due to demand ischemia   Problem List Items Addressed This Visit   Lumbago     Pain is managed with Lyrica 50mg  bid and Tramadol 50-100mg  bid prn.     Hypertension - Primary     Elevated SBp--takes Carvedilol 3.125mg , Furosemide 20mg -monitor Bps.     GERD (gastroesophageal reflux disease)     Stable on Nexium 40mg .     Acute diastolic congestive heart failure     BLE edema and SOB much improved after Lasix started while in hospital for elevated BNP and dependent edema. Now the patient is instructed to weight herself daily and call for wt gain 2-3 Ibs/24hrs or 3-5 Ibs/week.        Review of Systems:  Review of Systems  Constitutional: Positive for malaise/fatigue. Negative for fever, chills, weight loss and diaphoresis.  HENT: Positive for hearing loss. Negative for nosebleeds, congestion, sore throat, neck pain and ear discharge.   Eyes: Negative for blurred vision, double vision, photophobia, pain, discharge and redness.  Respiratory: Positive for shortness of breath. Negative for cough, sputum production, wheezing and stridor.   Cardiovascular: Positive for leg swelling and PND. Negative for chest pain, palpitations, orthopnea and claudication.   Gastrointestinal: Negative for heartburn, nausea, vomiting, abdominal pain, diarrhea, constipation and blood in stool.  Genitourinary: Positive for frequency. Negative for dysuria, urgency, hematuria and flank pain.  Musculoskeletal: Positive for back pain. Negative for myalgias, joint pain and falls.  Skin: Negative for itching and rash.  Neurological: Negative for dizziness, tingling, tremors, sensory change, speech change, focal weakness, seizures, loss of consciousness, weakness and headaches.  Endo/Heme/Allergies: Negative for environmental allergies and polydipsia. Does not bruise/bleed easily.  Psychiatric/Behavioral: Negative for depression, hallucinations and memory loss. The patient is not nervous/anxious and does not have insomnia.      Past Medical History  Diagnosis Date  . Senile osteoporosis   . Heart murmur   . Small bowel obstruction   . GERD (gastroesophageal reflux disease)   . Hypertension   . Glaucoma   . Arthritis     Osteoarthritis  . Personal history of fall   . Edema   . Hyperlipidemia   . Diverticulosis of colon (without mention of hemorrhage)   . Other malaise and fatigue   . Hypertonicity of bladder   . Osteoarthrosis, unspecified whether generalized or localized, unspecified site   . Lumbago   . Edema   . Pain in joint, pelvic region and thigh   . Pain in joint, shoulder region   . Flat feet    Past Surgical History  Procedure Laterality Date  . Tonsillectomy    . Colon surgery  02/1996    Colectomy, partial for diverticular bleed 90%  . Joint replacement  12/2000    Left  knee; Dr. Wynelle Link  . Breast lumpectomy Bilateral   . Laparoscopic lysis of adhesions  11/15/10    Exploratory   Social History:   reports that she quit smoking about 31 years ago. Her smoking use included Cigarettes. She has a 15 pack-year smoking history. She has never used smokeless tobacco. She reports that she drinks about 0.6 ounces of alcohol per week. She reports that  she does not use illicit drugs.  Family History  Problem Relation Age of Onset  . Cancer Father 60    colon    Medications: Patient's Medications  New Prescriptions   No medications on file  Previous Medications   ASPIRIN 81 MG TABLET    Take 81 mg by mouth daily.     BETIMOL 0.5 % OPHTHALMIC SOLUTION    Place 1 drop into both eyes every morning.    CALCIUM CARBONATE (OS-CAL) 600 MG TABS    Take 600 mg by mouth daily.   CARVEDILOL (COREG) 3.125 MG TABLET    Take 1 tablet (3.125 mg total) by mouth 2 (two) times daily with a meal.   CHOLECALCIFEROL (VITAMIN D3) 2000 UNITS TABS    Take 2,000 Units by mouth daily.     CYANOCOBALAMIN (B-12) 2000 MCG TABS    Take 2,000 mcg by mouth 3 (three) times a week. Monday, Wednesday, AND Friday   ESOMEPRAZOLE (NEXIUM) 40 MG CAPSULE    Take 40 mg by mouth daily.     FUROSEMIDE (LASIX) 20 MG TABLET    Take 1 tablet (20 mg total) by mouth daily.   KLOR-CON M10 10 MEQ TABLET    Take one tablet daily   LATANOPROST (XALATAN) 0.005 % OPHTHALMIC SOLUTION    1 drop at bedtime.   MULTIPLE VITAMIN (MULTIVITAMIN) CAPSULE    Take 1 capsule by mouth daily.     OMEGA-3 FATTY ACIDS (FISH OIL) 1200 MG CAPS    Take 1,200 mg by mouth 2 (two) times daily.     PREGABALIN (LYRICA) 50 MG CAPSULE    Take 50 mg by mouth 2 (two) times daily.     TRAMADOL (ULTRAM) 50 MG TABLET    Take 50-100 mg by mouth 2 (two) times daily as needed for pain.   VESICARE 5 MG TABLET    Daily.  Modified Medications   No medications on file  Discontinued Medications   POTASSIUM CHLORIDE (K-DUR) 10 MEQ TABLET    Take 1 tablet (10 mEq total) by mouth daily. Take one tablet daily     Physical Exam: Physical Exam  Constitutional: She is oriented to person, place, and time. She appears well-developed and well-nourished. No distress.  HENT:  Head: Normocephalic and atraumatic.  Right Ear: External ear normal.  Left Ear: External ear normal.  Nose: Nose normal.  Mouth/Throat: Oropharynx is  clear and moist. No oropharyngeal exudate.  Eyes: Conjunctivae and EOM are normal. Pupils are equal, round, and reactive to light. Right eye exhibits no discharge. Left eye exhibits no discharge. No scleral icterus.  Neck: Normal range of motion. Neck supple. No JVD present. No thyromegaly present.  Cardiovascular: Normal rate and regular rhythm.   Murmur heard.  Systolic murmur is present with a grade of 2/6  Pulmonary/Chest: Effort normal and breath sounds normal. No respiratory distress. She has no wheezes. She has no rales. She exhibits no tenderness.  Abdominal: Soft. Bowel sounds are normal. She exhibits no distension. There is no tenderness. There is no rebound.  Musculoskeletal: Normal range of motion.  She exhibits edema (trace). She exhibits no tenderness.  Lymphadenopathy:    She has no cervical adenopathy.  Neurological: She is alert and oriented to person, place, and time. She has normal reflexes. She displays normal reflexes. No cranial nerve deficit. She exhibits abnormal muscle tone. Coordination normal.  Skin: Skin is warm and dry. No rash noted. She is not diaphoretic. No erythema.  Psychiatric: She has a normal mood and affect. Her behavior is normal. Judgment and thought content normal.    Filed Vitals:   07/23/12 1619  BP: 168/82  Pulse: 72  Temp: 97.7 F (36.5 C)  TempSrc: Oral  Height: 5\' 4"  (1.626 m)  Weight: 161 lb (73.029 kg)  SpO2: 91%      Labs reviewed: Basic Metabolic Panel:  Recent Labs  07/18/12 0959 07/18/12 1406 07/19/12 0115 07/20/12 0440  NA 139  --  139 140  K 4.2  --  3.4* 3.8  CL 102  --  100 98  CO2 26  --  28 30  GLUCOSE 217*  --  108* 99  BUN 20  --  20 26*  CREATININE 1.22* 1.21* 1.30* 1.73*  CALCIUM 9.3  --  9.2 9.0  MG  --  2.0  --   --   TSH  --  1.733  --   --    Liver Function Tests:  Recent Labs  07/18/12 0959  AST 23  ALT 12  ALKPHOS 60  BILITOT 0.6  PROT 7.4  ALBUMIN 3.5   No results found for this  basename: LIPASE, AMYLASE,  in the last 8760 hours No results found for this basename: AMMONIA,  in the last 8760 hours CBC:  Recent Labs  07/18/12 0959 07/18/12 1406  WBC 12.8* 11.8*  NEUTROABS 10.6*  --   HGB 10.7* 11.2*  HCT 34.6* 35.0*  MCV 82.2 81.8  PLT 179 222   Lipid Panel: No results found for this basename: CHOL, HDL, LDLCALC, TRIG, CHOLHDL, LDLDIRECT,  in the last 8760 hours Anemia Panel: No results found for this basename: FOLATE, IRON, VITAMINB12,  in the last 8760 hours  Past Procedures:     Assessment/Plan Acute diastolic congestive heart failure BLE edema and SOB much improved after Lasix started while in hospital for elevated BNP and dependent edema. Now the patient is instructed to weight herself daily and call for wt gain 2-3 Ibs/24hrs or 3-5 Ibs/week.   Hypertension Elevated SBp--takes Carvedilol 3.125mg , Furosemide 20mg -monitor Bps.   GERD (gastroesophageal reflux disease) Stable on Nexium 40mg .   Lumbago Pain is managed with Lyrica 50mg  bid and Tramadol 50-100mg  bid prn.    Family/ Staff Communication:  Monitor weight and SOB  Goals of Care: [place]  Labs/tests ordered: f/u BMP and BNP

## 2012-08-05 ENCOUNTER — Other Ambulatory Visit: Payer: Self-pay | Admitting: Geriatric Medicine

## 2012-08-05 MED ORDER — PREGABALIN 50 MG PO CAPS: 50.0000 mg | ORAL_CAPSULE | Freq: Two times a day (BID) | ORAL | Status: DC

## 2012-08-07 ENCOUNTER — Other Ambulatory Visit: Payer: Self-pay | Admitting: Geriatric Medicine

## 2012-08-07 MED ORDER — POTASSIUM CHLORIDE CRYS ER 10 MEQ PO TBCR: 10.0000 meq | EXTENDED_RELEASE_TABLET | Freq: Every day | ORAL | Status: DC

## 2012-08-15 ENCOUNTER — Other Ambulatory Visit: Payer: Self-pay | Admitting: Internal Medicine

## 2012-08-27 ENCOUNTER — Other Ambulatory Visit: Payer: Self-pay | Admitting: Geriatric Medicine

## 2012-08-27 ENCOUNTER — Other Ambulatory Visit: Payer: Self-pay | Admitting: Internal Medicine

## 2012-08-27 MED ORDER — CARVEDILOL 3.125 MG PO TABS: 3.1250 mg | ORAL_TABLET | Freq: Two times a day (BID) | ORAL | Status: DC

## 2012-09-10 ENCOUNTER — Other Ambulatory Visit: Payer: Self-pay

## 2012-09-10 ENCOUNTER — Encounter: Payer: Self-pay | Admitting: Internal Medicine

## 2012-09-10 ENCOUNTER — Non-Acute Institutional Stay (INDEPENDENT_AMBULATORY_CARE_PROVIDER_SITE_OTHER): Payer: Medicare Other | Admitting: Internal Medicine

## 2012-09-10 VITALS — BP 118/60 | HR 80 | Ht 64.0 in | Wt 162.0 lb

## 2012-09-10 DIAGNOSIS — N318 Other neuromuscular dysfunction of bladder: Secondary | ICD-10-CM

## 2012-09-10 DIAGNOSIS — I10 Essential (primary) hypertension: Secondary | ICD-10-CM

## 2012-09-10 DIAGNOSIS — I509 Heart failure, unspecified: Secondary | ICD-10-CM

## 2012-09-10 DIAGNOSIS — I5032 Chronic diastolic (congestive) heart failure: Secondary | ICD-10-CM

## 2012-09-10 DIAGNOSIS — R609 Edema, unspecified: Secondary | ICD-10-CM

## 2012-09-10 DIAGNOSIS — E785 Hyperlipidemia, unspecified: Secondary | ICD-10-CM

## 2012-09-10 DIAGNOSIS — K219 Gastro-esophageal reflux disease without esophagitis: Secondary | ICD-10-CM

## 2012-09-10 NOTE — Progress Notes (Signed)
Patient ID: [name], female   DOB: 05/05/1925, 79 y.o.   MRN: [id number] Patient ID: [name], female   DOB: 07-27-1925, 79 y.o.   MRN: [id number]   Allergies  Allergen Reactions  . Sulfa Antibiotics Itching    Chief Complaint  Patient presents with  . Medical Managment of Chronic Issues    blood pressure, back pain, edema, cholesterol    HPI: Patient is a 79 y.o. female seen in the clinic at [facility] today for CHF, HTN, GERD, and lower back/leg pain  Hospitalized 07/18/12-07/20/12 for Acute respiratory failure. Most likely 2ary to acute CHF exacerbation given history and elevated BNP-Furosemide 20mg  daily started. Echocardiogram shows a grade II diastolic dysfunction Troponins within normal limits on last check-elevated Troponin likely due to demand ischemia.  She has done well since return home.  Hypertonicity of bladder: stable  Edema: stable at 1+ bipedal  Hyperlipidemia: stable  Hypertension: stable  GERD (gastroesophageal reflux disease): asymptomatic  Chronic diastolic congestive heart failure: on salt free diet. Using salt substitutes.  She has noted memory slippage.  Current Outpatient Prescriptions on File Prior to Visit  Medication Sig Dispense Refill  . aspirin 81 MG tablet Take 81 mg by mouth daily.        Marland Kitchen BETIMOL 0.5 % ophthalmic solution Place 1 drop into both eyes every morning.       . calcium carbonate (OS-CAL) 600 MG TABS Take 600 mg by mouth daily.      . carvedilol (COREG) 3.125 MG tablet Take 1 tablet (3.125 mg total) by mouth 2 (two) times daily with a meal.  60 tablet  3  . Cholecalciferol (VITAMIN D3) 2000 UNITS TABS Take 2,000 Units by mouth daily.        . Cyanocobalamin (B-12) 2000 MCG TABS Take 2,000 mcg by mouth 3 (three) times a week. Monday, Wednesday, AND Friday      . esomeprazole (NEXIUM) 40 MG capsule Take 40 mg by mouth daily.        . furosemide (LASIX) 20 MG tablet TAKE 1 TABLET EVERY DAY  30 tablet  0  .  latanoprost (XALATAN) 0.005 % ophthalmic solution 1 drop at bedtime.      . Multiple Vitamin (MULTIVITAMIN) capsule Take 1 capsule by mouth daily.        . Omega-3 Fatty Acids (FISH OIL) 1200 MG CAPS Take 1,200 mg by mouth 2 (two) times daily.        . potassium chloride (KLOR-CON M10) 10 MEQ tablet Take 1 tablet (10 mEq total) by mouth daily. Take one tablet daily  30 tablet  3  . pregabalin (LYRICA) 50 MG capsule Take 1 capsule (50 mg total) by mouth 2 (two) times daily.  180 capsule  3  . traMADol (ULTRAM) 50 MG tablet Take 50-100 mg by mouth 2 (two) times daily as needed for pain.      . VESICARE 5 MG tablet Daily.       No current facility-administered medications on file prior to visit.       Review of Systems:  Review of Systems  Constitutional: Negative for fever, chills, weight loss, malaise/fatigue and diaphoresis.  HENT: Positive for hearing loss. Negative for ear pain, nosebleeds, congestion, sore throat, neck pain and ear discharge.   Eyes: Negative for blurred vision, double vision, photophobia, pain, discharge and redness.  Respiratory: Negative for cough, sputum production, shortness of breath, wheezing and stridor.   Cardiovascular: Positive for leg swelling and  PND. Negative for chest pain, palpitations, orthopnea and claudication.       Mild bipedal edema  Gastrointestinal: Negative for heartburn, nausea, vomiting, abdominal pain, diarrhea, constipation and blood in stool.  Genitourinary: Positive for frequency. Negative for dysuria, urgency, hematuria and flank pain.  Musculoskeletal: Positive for back pain. Negative for myalgias, joint pain and falls.  Skin: Negative for itching and rash.  Neurological: Negative for dizziness, tingling, tremors, sensory change, speech change, focal weakness, seizures, loss of consciousness, weakness and headaches.  Endo/Heme/Allergies: Negative for environmental allergies and polydipsia. Does not bruise/bleed easily.   Psychiatric/Behavioral: Negative for depression, hallucinations and memory loss. The patient is not nervous/anxious and does not have insomnia.      Past Medical History  Diagnosis Date  . Senile osteoporosis   . Heart murmur   . Small bowel obstruction   . GERD (gastroesophageal reflux disease)   . Hypertension   . Glaucoma   . Arthritis     Osteoarthritis  . Personal history of fall   . Edema   . Hyperlipidemia   . Diverticulosis of colon (without mention of hemorrhage)   . Other malaise and fatigue   . Hypertonicity of bladder   . Osteoarthrosis, unspecified whether generalized or localized, unspecified site   . Lumbago   . Edema   . Pain in joint, pelvic region and thigh   . Pain in joint, shoulder region   . Flat feet    Past Surgical History  Procedure Laterality Date  . Tonsillectomy    . Colon surgery  02/1996    Colectomy, partial for diverticular bleed 90%  . Joint replacement  12/2000    Left knee; Dr. Wynelle Link  . Breast lumpectomy Bilateral   . Laparoscopic lysis of adhesions  11/15/10    Exploratory   Social History:   reports that she quit smoking about 31 years ago. Her smoking use included Cigarettes. She has a 15 pack-year smoking history. She has never used smokeless tobacco. She reports that she drinks about 0.6 ounces of alcohol per week. She reports that she does not use illicit drugs.  Family History  Problem Relation Age of Onset  . Cancer Father 44    colon    Medications: Patient's Medications  New Prescriptions   No medications on file  Previous Medications   ASPIRIN 81 MG TABLET    Take 81 mg by mouth daily.     BETIMOL 0.5 % OPHTHALMIC SOLUTION    Place 1 drop into both eyes every morning.    CALCIUM CARBONATE (OS-CAL) 600 MG TABS    Take 600 mg by mouth daily.   CARVEDILOL (COREG) 3.125 MG TABLET    Take 1 tablet (3.125 mg total) by mouth 2 (two) times daily with a meal.   CHOLECALCIFEROL (VITAMIN D3) 2000 UNITS TABS    Take 2,000  Units by mouth daily.     CYANOCOBALAMIN (B-12) 2000 MCG TABS    Take 2,000 mcg by mouth 3 (three) times a week. Monday, Wednesday, AND Friday   ESOMEPRAZOLE (NEXIUM) 40 MG CAPSULE    Take 40 mg by mouth daily.     FUROSEMIDE (LASIX) 20 MG TABLET    TAKE 1 TABLET EVERY DAY   LATANOPROST (XALATAN) 0.005 % OPHTHALMIC SOLUTION    1 drop at bedtime.   MULTIPLE VITAMIN (MULTIVITAMIN) CAPSULE    Take 1 capsule by mouth daily.     OMEGA-3 FATTY ACIDS (FISH OIL) 1200 MG CAPS    Take 1,200  mg by mouth 2 (two) times daily.     POTASSIUM CHLORIDE (KLOR-CON M10) 10 MEQ TABLET    Take 1 tablet (10 mEq total) by mouth daily. Take one tablet daily   PREGABALIN (LYRICA) 50 MG CAPSULE    Take 1 capsule (50 mg total) by mouth 2 (two) times daily.   TRAMADOL (ULTRAM) 50 MG TABLET    Take 50-100 mg by mouth 2 (two) times daily as needed for pain.   VESICARE 5 MG TABLET    Daily.  Modified Medications   No medications on file  Discontinued Medications   No medications on file     Physical Exam: Physical Exam  Constitutional: She is oriented to person, place, and time. She appears well-developed and well-nourished. No distress.  HENT:  Head: Normocephalic and atraumatic.  Right Ear: External ear normal.  Left Ear: External ear normal.  Nose: Nose normal.  Mouth/Throat: Oropharynx is clear and moist. No oropharyngeal exudate.  Eyes: Conjunctivae and EOM are normal. Pupils are equal, round, and reactive to light. Right eye exhibits no discharge. Left eye exhibits no discharge. No scleral icterus.  Neck: Normal range of motion. Neck supple. No JVD present. No thyromegaly present.  Cardiovascular: Normal rate and regular rhythm.   Murmur heard.  Systolic murmur is present with a grade of 2/6  Pulmonary/Chest: Effort normal and breath sounds normal. No respiratory distress. She has no wheezes. She has no rales. She exhibits no tenderness.  Abdominal: Soft. Bowel sounds are normal. She exhibits no distension.  There is no tenderness. There is no rebound.  Musculoskeletal: Normal range of motion. She exhibits edema (1+  bipedal). She exhibits no tenderness.  Lymphadenopathy:    She has no cervical adenopathy.  Neurological: She is alert and oriented to person, place, and time. She has normal reflexes. No cranial nerve deficit. She exhibits normal muscle tone. Coordination normal.  Skin: Skin is warm and dry. No rash noted. She is not diaphoretic. No erythema.  Psychiatric: She has a normal mood and affect. Her behavior is normal. Judgment and thought content normal.    Filed Vitals:   09/10/12 1337  BP: 118/60  Pulse: 80  Height: 5\' 4"  (1.626 m)  Weight: 162 lb (73.483 kg)      Labs reviewed: Basic Metabolic Panel:  Recent Labs  07/18/12 0959 07/18/12 1406 07/19/12 0115 07/20/12 0440  NA 139  --  139 140  K 4.2  --  3.4* 3.8  CL 102  --  100 98  CO2 26  --  28 30  GLUCOSE 217*  --  108* 99  BUN 20  --  20 26*  CREATININE 1.22* 1.21* 1.30* 1.73*  CALCIUM 9.3  --  9.2 9.0  MG  --  2.0  --   --   TSH  --  1.733  --   --    Liver Function Tests:  Recent Labs  07/18/12 0959  AST 23  ALT 12  ALKPHOS 60  BILITOT 0.6  PROT 7.4  ALBUMIN 3.5   No results found for this basename: LIPASE, AMYLASE,  in the last 8760 hours No results found for this basename: AMMONIA,  in the last 8760 hours CBC:  Recent Labs  07/18/12 0959 07/18/12 1406  WBC 12.8* 11.8*  NEUTROABS 10.6*  --   HGB 10.7* 11.2*  HCT 34.6* 35.0*  MCV 82.2 81.8  PLT 179 222   09/03/12 CMP: normal except creat. 1.40      Assessment/Plan  Hypertonicity of bladder: stable  Edema: less than when is hospital  Hyperlipidemia: recheck next vist  Hypertension : controlled  - Plan: CMP, Lipid panel  GERD (gastroesophageal reflux disease): asymptomatic  Chronic diastolic congestive heart failure: controlled. Weight stable   - Plan: Brain Natriuretic Peptide, Lipid panel    Family/ Staff  Communication:  Monitor weight and SOB  Goals of Care: IL  Labs/tests ordered: f/u BMP and BNP

## 2012-09-10 NOTE — Patient Instructions (Signed)
Continue on current medications.

## 2012-09-21 ENCOUNTER — Other Ambulatory Visit: Payer: Self-pay | Admitting: Geriatric Medicine

## 2012-09-21 ENCOUNTER — Other Ambulatory Visit: Payer: Self-pay | Admitting: Internal Medicine

## 2012-09-21 ENCOUNTER — Encounter: Payer: Self-pay | Admitting: Internal Medicine

## 2012-09-21 MED ORDER — CARVEDILOL 3.125 MG PO TABS: 3.1250 mg | ORAL_TABLET | Freq: Two times a day (BID) | ORAL | Status: DC

## 2012-10-14 ENCOUNTER — Other Ambulatory Visit: Payer: Self-pay | Admitting: Internal Medicine

## 2012-10-20 ENCOUNTER — Ambulatory Visit (INDEPENDENT_AMBULATORY_CARE_PROVIDER_SITE_OTHER): Payer: Medicare Other | Admitting: Family Medicine

## 2012-10-20 ENCOUNTER — Ambulatory Visit: Payer: Medicare Other

## 2012-10-20 ENCOUNTER — Ambulatory Visit (HOSPITAL_COMMUNITY)
Admission: RE | Admit: 2012-10-20 | Discharge: 2012-10-20 | Disposition: A | Discharge status: Home / Self Care | Payer: Medicare Other | Source: Ambulatory Visit | Attending: Emergency Medicine | Admitting: Emergency Medicine

## 2012-10-20 VITALS — BP 138/76 | HR 63 | Temp 98.4°F | Resp 20 | Ht 67.0 in | Wt 155.4 lb

## 2012-10-20 DIAGNOSIS — S0003XA Contusion of scalp, initial encounter: Secondary | ICD-10-CM | POA: Insufficient documentation

## 2012-10-20 DIAGNOSIS — S0120XA Unspecified open wound of nose, initial encounter: Secondary | ICD-10-CM

## 2012-10-20 DIAGNOSIS — S0990XA Unspecified injury of head, initial encounter: Secondary | ICD-10-CM

## 2012-10-20 DIAGNOSIS — G319 Degenerative disease of nervous system, unspecified: Secondary | ICD-10-CM | POA: Insufficient documentation

## 2012-10-20 DIAGNOSIS — S0121XA Laceration without foreign body of nose, initial encounter: Secondary | ICD-10-CM

## 2012-10-20 DIAGNOSIS — M25531 Pain in right wrist: Secondary | ICD-10-CM

## 2012-10-20 DIAGNOSIS — M25539 Pain in unspecified wrist: Secondary | ICD-10-CM

## 2012-10-20 DIAGNOSIS — I7090 Unspecified atherosclerosis: Secondary | ICD-10-CM | POA: Insufficient documentation

## 2012-10-20 DIAGNOSIS — S139XXA Sprain of joints and ligaments of unspecified parts of neck, initial encounter: Secondary | ICD-10-CM

## 2012-10-20 DIAGNOSIS — IMO0002 Reserved for concepts with insufficient information to code with codable children: Secondary | ICD-10-CM

## 2012-10-20 DIAGNOSIS — T148XXA Other injury of unspecified body region, initial encounter: Secondary | ICD-10-CM

## 2012-10-20 DIAGNOSIS — W19XXXA Unspecified fall, initial encounter: Secondary | ICD-10-CM | POA: Insufficient documentation

## 2012-10-20 NOTE — Progress Notes (Signed)
Subjective:    Patient ID: [name], female    DOB: 1925/11/04, 79 y.o.   MRN: [id number]  HPI 79 yo female with complaint of tripping while walking today and hitting head on door, then falling to floor.  No LOC.  Complaints of nose laceration, right wrist pain, right knee pain, and right sided neck pain with fall.  No weakness or numbness.  No headache. Nose laceration with minimal to no pain in nasal region.  No difficulty breathing through nose.   Pain on right side of neck worse with movement of head.  Pain with wrist flexion and extension.  No swelling of wrist.  No bruising noted.  Right knee pain mild and not change with movement.  Describes as on skin surface and not in knee, no radiation.  Per family patient's mental status at baseline.  No current confusion.    Past Medical History  Diagnosis Date  . Senile osteoporosis   . Heart murmur   . Small bowel obstruction   . GERD (gastroesophageal reflux disease)   . Hypertension   . Glaucoma   . Arthritis     Osteoarthritis  . Personal history of fall   . Edema   . Hyperlipidemia   . Diverticulosis of colon (without mention of hemorrhage)   . Other malaise and fatigue   . Hypertonicity of bladder   . Osteoarthrosis, unspecified whether generalized or localized, unspecified site   . Lumbago   . Edema   . Pain in joint, pelvic region and thigh   . Pain in joint, shoulder region   . Flat feet    Past Surgical History  Procedure Laterality Date  . Tonsillectomy    . Colon surgery  02/1996    Colectomy, partial for diverticular bleed 90%  . Joint replacement  12/2000    Left knee; Dr. Wynelle Link  . Breast lumpectomy Bilateral   . Laparoscopic lysis of adhesions  11/15/10    Exploratory   Current Outpatient Prescriptions on File Prior to Visit  Medication Sig Dispense Refill  . aspirin 81 MG tablet Take 81 mg by mouth daily.        Marland Kitchen BETIMOL 0.5 % ophthalmic solution Place 1 drop into both eyes every morning.       .  calcium carbonate (OS-CAL) 600 MG TABS Take 600 mg by mouth daily.      . carvedilol (COREG) 3.125 MG tablet Take 1 tablet (3.125 mg total) by mouth 2 (two) times daily with a meal.  60 tablet  3  . Cholecalciferol (VITAMIN D3) 2000 UNITS TABS Take 2,000 Units by mouth daily.        . Cyanocobalamin (B-12) 2000 MCG TABS Take 2,000 mcg by mouth 3 (three) times a week. Monday, Wednesday, AND Friday      . esomeprazole (NEXIUM) 40 MG capsule Take 40 mg by mouth daily.        . furosemide (LASIX) 20 MG tablet TAKE 1 TABLET EVERY DAY  30 tablet  0  . latanoprost (XALATAN) 0.005 % ophthalmic solution 1 drop at bedtime.      . Multiple Vitamin (MULTIVITAMIN) capsule Take 1 capsule by mouth daily.        . Omega-3 Fatty Acids (FISH OIL) 1200 MG CAPS Take 1,200 mg by mouth 2 (two) times daily.        . potassium chloride (KLOR-CON M10) 10 MEQ tablet Take 1 tablet (10 mEq total) by mouth daily. Take one tablet daily  30 tablet  3  . pregabalin (LYRICA) 50 MG capsule Take 1 capsule (50 mg total) by mouth 2 (two) times daily.  180 capsule  3  . traMADol (ULTRAM) 50 MG tablet Take 50-100 mg by mouth 2 (two) times daily as needed for pain.      . VESICARE 5 MG tablet Daily.       No current facility-administered medications on file prior to visit.   Allergies  Allergen Reactions  . Sulfa Antibiotics Itching      Review of Systems  Constitutional: Negative for fever, chills and activity change.  HENT: Positive for neck pain and neck stiffness. Negative for hearing loss, nosebleeds and tinnitus.   Eyes: Negative for photophobia and visual disturbance.  Respiratory: Negative for chest tightness and shortness of breath.   Cardiovascular: Negative for chest pain and palpitations.  Gastrointestinal: Negative for abdominal pain and abdominal distention.  Musculoskeletal: Negative for back pain and gait problem.  Skin: Negative for color change and rash.  Neurological: Negative for syncope, weakness,  light-headedness, numbness and headaches.  Psychiatric/Behavioral: Negative for confusion.       Objective:   Physical Exam Blood pressure 138/76, pulse 63, temperature 98.4 F (36.9 C), temperature source Oral, resp. rate 20, height 5\' 7"  (1.702 m), weight 155 lb 6.4 oz (70.489 kg), SpO2 96.00%. Body mass index is 24.33 kg/(m^2). Well-developed, well nourished female who is awake, alert and oriented, in NAD. HEENT: frontal hematoma with 0.5 cm nasal laceration (superficial), PERRL, EOMI.  Sclera and conjunctiva are clear.  EAC are patent, TMs are normal in appearance. Nasal mucosa is pink and moist with no nasal septal hematoma,  OP is clear. Neck: supple, no C-spine tenderness, NEXUS negative, no lymphadenopathy, thyromegaly. Heart: RRR, no murmur Lungs: normal effort, CTA Abdomen: normo-active bowel sounds, supple, non-tender, no mass or organomegaly. Extremities: right wrist with mild tenderness to distal radius, FROM, no snuff box tenderness.  Pulses intact with no swelling.  Right knee with abrasion to infrapatellar region, FROM, strength 5/5. Skin: above noted nasal lac and knee abrasion Psychologic: good mood and appropriate affect, normal speech and behavior.  Wrist Xray:  Old avulsion ulnar styloid, CMC arthritis, No acute fracture.  Procedure note: A volar fiberglass wrist splint was applied in clinic tonight. N/V intact after application of splint.    Assessment & Plan:  1.  Nasal laceration - dressed with bandaid. 2.  Head injury with hematoma - CT scan tonight, will call with results 3.  Wrist sprain - volar splint applied in urgent care tonight. 4.  Abrasion - basic wound care 5.  Neck sprain - no evidence for bony involvement.  Follow up in one week unless we notify her of CT findings that may need more emergent evaluation.

## 2012-10-20 NOTE — Patient Instructions (Addendum)
Laceration Care, Adult A laceration is a cut or lesion that goes through all layers of the skin and into the tissue just beneath the skin. TREATMENT  Some lacerations may not require closure. Some lacerations may not be able to be closed due to an increased risk of infection. It is important to see your caregiver as soon as possible after an injury to minimize the risk of infection and maximize the opportunity for successful closure. If closure is appropriate, pain medicines may be given, if needed. The wound will be cleaned to help prevent infection. Your caregiver will use stitches (sutures), staples, wound glue (adhesive), or skin adhesive strips to repair the laceration. These tools bring the skin edges together to allow for faster healing and a better cosmetic outcome. However, all wounds will heal with a scar. Once the wound has healed, scarring can be minimized by covering the wound with sunscreen during the day for 1 full year. HOME CARE INSTRUCTIONS  For sutures or staples:  Keep the wound clean and dry.  If you were given a bandage (dressing), you should change it at least once a day. Also, change the dressing if it becomes wet or dirty, or as directed by your caregiver.  Wash the wound with soap and water 2 times a day. Rinse the wound off with water to remove all soap. Pat the wound dry with a clean towel.  After cleaning, apply a thin layer of the antibiotic ointment as recommended by your caregiver. This will help prevent infection and keep the dressing from sticking.  You may shower as usual after the first 24 hours. Do not soak the wound in water until the sutures are removed.  Only take over-the-counter or prescription medicines for pain, discomfort, or fever as directed by your caregiver.  Get your sutures or staples removed as directed by your caregiver. For skin adhesive strips:  Keep the wound clean and dry.  Do not get the skin adhesive strips wet. You may bathe  carefully, using caution to keep the wound dry.  If the wound gets wet, pat it dry with a clean towel.  Skin adhesive strips will fall off on their own. You may trim the strips as the wound heals. Do not remove skin adhesive strips that are still stuck to the wound. They will fall off in time. For wound adhesive:  You may briefly wet your wound in the shower or bath. Do not soak or scrub the wound. Do not swim. Avoid periods of heavy perspiration until the skin adhesive has fallen off on its own. After showering or bathing, gently pat the wound dry with a clean towel.  Do not apply liquid medicine, cream medicine, or ointment medicine to your wound while the skin adhesive is in place. This may loosen the film before your wound is healed.  If a dressing is placed over the wound, be careful not to apply tape directly over the skin adhesive. This may cause the adhesive to be pulled off before the wound is healed.  Avoid prolonged exposure to sunlight or tanning lamps while the skin adhesive is in place. Exposure to ultraviolet light in the first year will darken the scar.  The skin adhesive will usually remain in place for 5 to 10 days, then naturally fall off the skin. Do not pick at the adhesive film. You may need a tetanus shot if:  You cannot remember when you had your last tetanus shot.  You have never had a tetanus  shot. If you get a tetanus shot, your arm may swell, get red, and feel warm to the touch. This is common and not a problem. If you need a tetanus shot and you choose not to have one, there is a rare chance of getting tetanus. Sickness from tetanus can be serious. SEEK MEDICAL CARE IF:   You have redness, swelling, or increasing pain in the wound.  You see a red line that goes away from the wound.  You have yellowish-white fluid (pus) coming from the wound.  You have a fever.  You notice a bad smell coming from the wound or dressing.  Your wound breaks open before or  after sutures have been removed.  You notice something coming out of the wound such as wood or glass.  Your wound is on your hand or foot and you cannot move a finger or toe. SEEK IMMEDIATE MEDICAL CARE IF:   Your pain is not controlled with prescribed medicine.  You have severe swelling around the wound causing pain and numbness or a change in color in your arm, hand, leg, or foot.  Your wound splits open and starts bleeding.  You have worsening numbness, weakness, or loss of function of any joint around or beyond the wound.  You develop painful lumps near the wound or on the skin anywhere on your body. MAKE SURE YOU:   Understand these instructions.  Will watch your condition.  Will get help right away if you are not doing well or get worse. Document Released: 02/04/2005 Document Revised: 04/29/2011 Document Reviewed: 07/31/2010 [facility] Patient Information 2014 [place], [place]. Head Injury, Adult You have had a head injury that does not appear serious at this time. A concussion is a state of changed mental ability, usually from a blow to the head. You should take clear liquids for the rest of the day and then resume your regular diet. You should not take sedatives or alcoholic beverages for as long as directed by your caregiver after discharge. After injuries such as yours, most problems occur within the first 24 hours. SYMPTOMS These minor symptoms may be experienced after discharge:  Memory difficulties.  Dizziness.  Headaches.  Double vision.  Hearing difficulties.  Depression.  Tiredness.  Weakness.  Difficulty with concentration. If you experience any of these problems, you should not be alarmed. A concussion requires a few days for recovery. Many patients with head injuries frequently experience such symptoms. Usually, these problems disappear without medical care. If symptoms last for more than one day, notify your caregiver. See your caregiver sooner if  symptoms are becoming worse rather than better. HOME CARE INSTRUCTIONS   During the next 24 hours you must stay with someone who can watch you for the warning signs listed below. Although it is unlikely that serious side effects will occur, you should be aware of signs and symptoms which may necessitate your return to this location. Side effects may occur up to 7  10 days following the injury. It is important for you to carefully monitor your condition and contact your caregiver or seek immediate medical attention if there is a change in your condition. SEEK IMMEDIATE MEDICAL CARE IF:   There is confusion or drowsiness.  You can not awaken the injured person.  There is nausea (feeling sick to your stomach) or continued, forceful vomiting.  You notice dizziness or unsteadiness which is getting worse, or inability to walk.  You have convulsions or unconsciousness.  You experience severe, persistent headaches not  relieved by over-the-counter or prescription medicines for pain. (Do not take aspirin as this impairs clotting abilities). Take other pain medications only as directed.  You can not use arms or legs normally.  There is clear or bloody discharge from the nose or ears. MAKE SURE YOU:   Understand these instructions.  Will watch your condition.  Will get help right away if you are not doing well or get worse. Document Released: 02/04/2005 Document Revised: 04/29/2011 Document Reviewed: 12/23/2008 [facility] Patient Information 2014 [place]. Wrist Pain Wrist injuries are frequent in adults and children. A sprain is an injury to the ligaments that hold your bones together. A strain is an injury to muscle or muscle cord-like structures (tendons) from stretching or pulling. Generally, when wrists are moderately tender to touch following a fall or injury, a break in the bone (fracture) may be present. Most wrist sprains or strains are better in 3 to 5 days, but complete healing  may take several weeks. HOME CARE INSTRUCTIONS   Put ice on the injured area.  Put ice in a plastic bag.  Place a towel between your skin and the bag.  Leave the ice on for 15-20 minutes, 3-4 times a day, for the first 2 days.  Keep your arm raised above the level of your heart whenever possible to reduce swelling and pain.  Rest the injured area for at least 48 hours or as directed by your caregiver.  If a splint or elastic bandage has been applied, use it for as long as directed by your caregiver or until seen by a caregiver for a follow-up exam.  Only take over-the-counter or prescription medicines for pain, discomfort, or fever as directed by your caregiver.  Keep all follow-up appointments. You may need to follow up with a specialist or have follow-up X-rays. Improvement in pain level is not a guarantee that you did not fracture a bone in your wrist. The only way to determine whether or not you have a broken bone is by X-ray. SEEK IMMEDIATE MEDICAL CARE IF:   Your fingers are swollen, very red, white, or cold and blue.  Your fingers are numb or tingling.  You have increasing pain.  You have difficulty moving your fingers. MAKE SURE YOU:   Understand these instructions.  Will watch your condition.  Will get help right away if you are not doing well or get worse. Document Released: 11/14/2004 Document Revised: 04/29/2011 Document Reviewed: 03/28/2010 [facility] Patient Information 2014 [place].  Wear the splint for one week, you may remove it for bathing.    You need to go to the hospital for a CT scan tonight.  Follow up with [place] in one week for re-evaluation of your wrist and other injuries.

## 2012-10-21 NOTE — Progress Notes (Signed)
patient discussed with Dr. Meredith Staggers. Agree with assessment and plan of care per his note. CT head report noted and results given via medical assistant night of study.

## 2012-10-25 ENCOUNTER — Ambulatory Visit (INDEPENDENT_AMBULATORY_CARE_PROVIDER_SITE_OTHER): Payer: Medicare Other | Admitting: Physician Assistant

## 2012-10-25 VITALS — BP 148/88 | HR 83 | Temp 98.5°F | Resp 16 | Ht 67.0 in | Wt 154.8 lb

## 2012-10-25 DIAGNOSIS — S139XXD Sprain of joints and ligaments of unspecified parts of neck, subsequent encounter: Secondary | ICD-10-CM

## 2012-10-25 DIAGNOSIS — S0990XA Unspecified injury of head, initial encounter: Secondary | ICD-10-CM

## 2012-10-25 DIAGNOSIS — Z5189 Encounter for other specified aftercare: Secondary | ICD-10-CM

## 2012-10-25 DIAGNOSIS — M25531 Pain in right wrist: Secondary | ICD-10-CM

## 2012-10-25 DIAGNOSIS — M259 Joint disorder, unspecified: Secondary | ICD-10-CM

## 2012-10-25 NOTE — Patient Instructions (Addendum)
You may continue to use the Biofreeze on your neck. You do not need to wear the splint on your wrist anymore, but if you have discomfort, you may wear it. Try a large wedge pillow to allow you to "prop up" at night for easier breathing, but without bending your neck so much.

## 2012-10-25 NOTE — Progress Notes (Signed)
  Subjective:    Patient ID: [name], female    DOB: 04-28-1925, 79 y.o.   MRN: [id number]  HPI This 79 y.o. female presents for evaluation of RIGHT wrist injury. She was seen on 10/20/12 after she tripped, hit her head on a door and then fell.  In addition to the wrist injury (plan films were negative here and she was placed ina volar splint), she sustained a laceration to the bridge of her nose, RIGHT neck sprain and head injury (CT of the head that evening was negative).  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  Several days ago she stopped using the volar splint and switched to a velcro splint which she is able to remove and replace without assistance.  She denies HA, dizziness and visual change, and has had no nausea or vomiting.  Medications, allergies, past medical history, surgical history, family history, social history and problem list reviewed.   Review of Systems As above.    Objective:   Physical Exam  Blood pressure 148/88, pulse 83, temperature 98.5 F (36.9 C), temperature source Oral, resp. rate 16, height 5\' 7"  (1.702 m), weight 154 lb 12.8 oz (70.217 kg), SpO2 97.00%. Body mass index is 24.24 kg/(m^2). Well-developed, well nourished WF who is awake, alert and oriented, in NAD. She is accompanied by her high-school aged granddaughter. HEENT: [place]/AT, PERRL, EOMI.  Sclera and conjunctiva are clear.   Neck: supple, non-tender anteriorly, no lymphadenopathy, thyromegaly. There is mild tenderness of the paraspinous muscles at the base of the neck bilaterally.  FROM without pain. Extremities: no cyanosis, clubbing or edema. The splint is removed from the wrist.  There is resolving ecchymosis noted.  Non-tender on palpation or with ROM. Skin: warm and dry without rash. Ecchymosis surrounding  Both eyes is  resolving.  Laceration across nasal bridge is well healed without evidence of infection. Psychologic: good mood and appropriate affect, normal speech and behavior.       Assessment & Plan:  Wrist pain, right - Resolved.  If she develops recurrent pain, she may resume use of the splint for comfort.  Head injury, unspecified - NEGATIVE head CT.  Asymptomatic now.  Sprain, neck, subsequent encounter - continue with topical Biofreeze as needed.  Follow-up with PCP as planned.  Fara Chute, PA-C Physician Assistant-Certified [facility]

## 2012-11-14 ENCOUNTER — Other Ambulatory Visit: Payer: Self-pay | Admitting: Nurse Practitioner

## 2012-12-07 ENCOUNTER — Other Ambulatory Visit: Payer: Self-pay | Admitting: Internal Medicine

## 2012-12-14 ENCOUNTER — Other Ambulatory Visit: Payer: Self-pay | Admitting: Nurse Practitioner

## 2012-12-15 ENCOUNTER — Other Ambulatory Visit: Payer: Self-pay | Admitting: Internal Medicine

## 2012-12-17 ENCOUNTER — Non-Acute Institutional Stay: Payer: Medicare Other | Admitting: Internal Medicine

## 2012-12-17 ENCOUNTER — Encounter: Payer: Self-pay | Admitting: Internal Medicine

## 2012-12-17 VITALS — BP 132/72 | HR 68 | Ht 67.0 in | Wt 151.0 lb

## 2012-12-17 DIAGNOSIS — D649 Anemia, unspecified: Secondary | ICD-10-CM

## 2012-12-17 DIAGNOSIS — I10 Essential (primary) hypertension: Secondary | ICD-10-CM

## 2012-12-17 DIAGNOSIS — I5032 Chronic diastolic (congestive) heart failure: Secondary | ICD-10-CM

## 2012-12-17 DIAGNOSIS — G319 Degenerative disease of nervous system, unspecified: Secondary | ICD-10-CM

## 2012-12-17 DIAGNOSIS — I6789 Other cerebrovascular disease: Secondary | ICD-10-CM

## 2012-12-17 DIAGNOSIS — I6782 Cerebral ischemia: Secondary | ICD-10-CM

## 2012-12-17 DIAGNOSIS — M25511 Pain in right shoulder: Secondary | ICD-10-CM

## 2012-12-17 DIAGNOSIS — I672 Cerebral atherosclerosis: Secondary | ICD-10-CM

## 2012-12-17 DIAGNOSIS — I509 Heart failure, unspecified: Secondary | ICD-10-CM

## 2012-12-17 DIAGNOSIS — E785 Hyperlipidemia, unspecified: Secondary | ICD-10-CM

## 2012-12-17 DIAGNOSIS — R609 Edema, unspecified: Secondary | ICD-10-CM

## 2012-12-17 DIAGNOSIS — K219 Gastro-esophageal reflux disease without esophagitis: Secondary | ICD-10-CM

## 2012-12-17 DIAGNOSIS — M25519 Pain in unspecified shoulder: Secondary | ICD-10-CM

## 2012-12-17 NOTE — Progress Notes (Signed)
Subjective:    Patient ID: [name], female    DOB: [birth year], 79 y.o.   MRN: [id number]  Chief Complaint  Patient presents with  . Medical Managment of Chronic Issues    blood pressure, CHF, edema  . Fall    fell on 10/20/12 went to Urgent Care. Tripped on rug at church. Hit head on door then fell to floor, nose laceration, right wrist, kne, neck pain.     HPI Had a fall by tripping on a rug in Sept 2014. Went to Urgent Care. Hit forehead. Had CT brain at [facility]: Chronic microvascular ischemic changes, cerebral atrophy, cerebral atherosclerosis.  Upper Back was a problem. Saw Dr. Nelva Bush. To start Pt here next week. Having neck pain since her fall in Sept 2014.  Sometimes gets short of breath. Occurs more often with exertion. Has trace edma of both ankles.  Hypertension: controlled  Chronic diastolic congestive heart failure: controlled  GERD (gastroesophageal reflux disease): asymptomatic  Edema; trace bilaterally. Improved from the past  Pain in joint, shoulder region, right/. Seeing ortho  Hyperlipidemia: controlled    Current Outpatient Prescriptions on File Prior to Visit  Medication Sig Dispense Refill  . aspirin 81 MG tablet Take 81 mg by mouth daily.        Marland Kitchen BETIMOL 0.5 % ophthalmic solution Place 1 drop into both eyes every morning.       . calcium carbonate (OS-CAL) 600 MG TABS Take 600 mg by mouth daily.      . carvedilol (COREG) 3.125 MG tablet Take 1 tablet (3.125 mg total) by mouth 2 (two) times daily with a meal.  60 tablet  3  . Cholecalciferol (VITAMIN D3) 2000 UNITS TABS Take 2,000 Units by mouth daily.        . Cyanocobalamin (B-12) 2000 MCG TABS Take 2,000 mcg by mouth 3 (three) times a week. Monday, Wednesday, AND Friday      . esomeprazole (NEXIUM) 40 MG capsule Take 40 mg by mouth daily.        . furosemide (LASIX) 20 MG tablet TAKE 1 TABLET BY MOUTH DAILY  30 tablet  3  . latanoprost (XALATAN) 0.005 % ophthalmic solution 1 drop at bedtime.       . Multiple Vitamin (MULTIVITAMIN) capsule Take 1 capsule by mouth daily.        . Omega-3 Fatty Acids (FISH OIL) 1200 MG CAPS Take 1,200 mg by mouth 2 (two) times daily.        . potassium chloride (K-DUR,KLOR-CON) 10 MEQ tablet 1 by mouth daily   30 tablet  3  . pregabalin (LYRICA) 50 MG capsule Take 1 capsule (50 mg total) by mouth 2 (two) times daily.  180 capsule  3  . traMADol (ULTRAM) 50 MG tablet Take 50-100 mg by mouth 2 (two) times daily as needed for pain.       No current facility-administered medications on file prior to visit.    Review of Systems  Constitutional: Negative.   HENT: Positive for hearing loss. Negative for ear pain.   Eyes: Negative.   Respiratory: Negative.   Cardiovascular: Positive for leg swelling. Negative for chest pain and palpitations.  Gastrointestinal: Negative.   Endocrine: Negative.   Genitourinary: Negative.   Musculoskeletal: Positive for back pain. Negative for myalgias, neck pain and neck stiffness.  Skin: Negative.   Neurological: Negative.   Hematological: Negative.   Psychiatric/Behavioral: Negative.        Objective:BP 132/72  Pulse 68  Ht 5\' 7"  (1.702 m)  Wt 151 lb (68.493 kg)  BMI 23.64 kg/m2    Physical Exam  Constitutional: She is oriented to person, place, and time. She appears well-developed and well-nourished. No distress.  HENT:  Head: Normocephalic and atraumatic.  Right Ear: External ear normal.  Left Ear: External ear normal.  Nose: Nose normal.  Mouth/Throat: Oropharynx is clear and moist. No oropharyngeal exudate.  Eyes: Conjunctivae and EOM are normal. Pupils are equal, round, and reactive to light. Right eye exhibits no discharge. Left eye exhibits no discharge. No scleral icterus.  Neck: Normal range of motion. Neck supple. No JVD present. No thyromegaly present.  Cardiovascular: Normal rate and regular rhythm.   Murmur heard.  Systolic murmur is present with a grade of 2/6  Pulmonary/Chest: Effort normal  and breath sounds normal. No respiratory distress. She has no wheezes. She has no rales. She exhibits no tenderness.  Abdominal: Soft. Bowel sounds are normal. She exhibits no distension. There is no tenderness. There is no rebound.  Musculoskeletal: Normal range of motion. She exhibits edema (1+  bipedal). She exhibits no tenderness.  Lymphadenopathy:    She has no cervical adenopathy.  Neurological: She is alert and oriented to person, place, and time. She has normal reflexes. No cranial nerve deficit. She exhibits normal muscle tone. Coordination normal.  Skin: Skin is warm and dry. No rash noted. She is not diaphoretic. No erythema.  Psychiatric: She has a normal mood and affect. Her behavior is normal. Judgment and thought content normal.      LAB review 12/08/12 CMP: Glucose 90, BUN 14, creatinine 1.28  Lipids: TC 169, trig 126, HDL 45, LDL 99  BNP: 138.6    Assessment & Plan:  Hypertension: Controlled  Chronic diastolic congestive heart failure: Controlled  GERD (gastroesophageal reflux disease): Asymptomatic  Edema: Improved  Pain in joint, shoulder region, right: Chronic, unchanged  Hyperlipidemia: Controlled  Cerebral ischemia: Noted on CT brain, chronic microvascular  Cerebral atrophy: Noted on CT brain  Cerebral atherosclerosis: Incidental finding on CT brain  Anemia: Patient was anemic in early 2014. The last hemoglobin was done in May 2014 was 11.2 hemoglobin was gradually improving at that time. It needs followup prior to next visit.

## 2012-12-19 ENCOUNTER — Encounter: Payer: Self-pay | Admitting: Internal Medicine

## 2012-12-19 DIAGNOSIS — I6782 Cerebral ischemia: Secondary | ICD-10-CM | POA: Insufficient documentation

## 2012-12-19 DIAGNOSIS — I672 Cerebral atherosclerosis: Secondary | ICD-10-CM

## 2012-12-19 DIAGNOSIS — G319 Degenerative disease of nervous system, unspecified: Secondary | ICD-10-CM

## 2012-12-19 DIAGNOSIS — D638 Anemia in other chronic diseases classified elsewhere: Secondary | ICD-10-CM | POA: Insufficient documentation

## 2012-12-19 HISTORY — DX: Degenerative disease of nervous system, unspecified: G31.9

## 2012-12-19 HISTORY — DX: Cerebral ischemia: I67.82

## 2012-12-19 HISTORY — DX: Cerebral atherosclerosis: I67.2

## 2012-12-19 NOTE — Patient Instructions (Signed)
Continue current medications. 

## 2013-01-05 ENCOUNTER — Telehealth: Payer: Self-pay | Admitting: *Deleted

## 2013-01-05 NOTE — Telephone Encounter (Signed)
Patient wants to know if its necessary that she has a Film/video editor for her heart failure. And if so who would you recommend? Please advise.

## 2013-01-05 NOTE — Telephone Encounter (Signed)
Her heart failure is currently controlled. If she would like a cardiologist, refer to Dr. Aundra Dubin.

## 2013-01-06 NOTE — Telephone Encounter (Signed)
Patient Notified and stated that she would hold off on a cardiologist for now.

## 2013-01-09 ENCOUNTER — Other Ambulatory Visit: Payer: Self-pay | Admitting: Internal Medicine

## 2013-01-12 ENCOUNTER — Encounter: Payer: Self-pay | Admitting: Internal Medicine

## 2013-02-13 ENCOUNTER — Other Ambulatory Visit: Payer: Self-pay | Admitting: Nurse Practitioner

## 2013-03-24 ENCOUNTER — Other Ambulatory Visit: Payer: Self-pay | Admitting: Internal Medicine

## 2013-03-25 ENCOUNTER — Other Ambulatory Visit: Payer: Self-pay | Admitting: *Deleted

## 2013-03-25 MED ORDER — ESOMEPRAZOLE MAGNESIUM 40 MG PO CPDR: DELAYED_RELEASE_CAPSULE | ORAL | Status: DC

## 2013-03-30 ENCOUNTER — Encounter: Payer: Self-pay | Admitting: Internal Medicine

## 2013-03-30 LAB — BASIC METABOLIC PANEL
BUN: 20 mg/dL (ref 4–21)
Creatinine: 1.2 mg/dL — AB (ref 0.5–1.1)
GLUCOSE: 91 mg/dL
POTASSIUM: 4.2 mmol/L (ref 3.4–5.3)
Sodium: 140 mmol/L (ref 137–147)

## 2013-04-08 ENCOUNTER — Encounter: Payer: Self-pay | Admitting: Internal Medicine

## 2013-04-08 ENCOUNTER — Non-Acute Institutional Stay: Payer: Medicare Other | Admitting: Internal Medicine

## 2013-04-08 VITALS — BP 154/78 | HR 64 | Wt 146.0 lb

## 2013-04-08 DIAGNOSIS — M545 Low back pain, unspecified: Secondary | ICD-10-CM

## 2013-04-08 DIAGNOSIS — E785 Hyperlipidemia, unspecified: Secondary | ICD-10-CM

## 2013-04-08 DIAGNOSIS — N390 Urinary tract infection, site not specified: Secondary | ICD-10-CM

## 2013-04-08 DIAGNOSIS — M2140 Flat foot [pes planus] (acquired), unspecified foot: Secondary | ICD-10-CM

## 2013-04-08 DIAGNOSIS — I10 Essential (primary) hypertension: Secondary | ICD-10-CM

## 2013-04-08 DIAGNOSIS — I5032 Chronic diastolic (congestive) heart failure: Secondary | ICD-10-CM

## 2013-04-08 DIAGNOSIS — I509 Heart failure, unspecified: Secondary | ICD-10-CM

## 2013-04-08 DIAGNOSIS — M2141 Flat foot [pes planus] (acquired), right foot: Secondary | ICD-10-CM

## 2013-04-08 DIAGNOSIS — M2142 Flat foot [pes planus] (acquired), left foot: Secondary | ICD-10-CM

## 2013-04-08 NOTE — Progress Notes (Signed)
Patient ID: [name], female   DOB: 10/13/1925, 80 y.o.   MRN: [id number]    Location:  [facility]   Place of Service: Clinic (12)    Allergies  Allergen Reactions  . Sulfa Antibiotics Itching    Chief Complaint  Patient presents with  . Medical Managment of Chronic Issues    blood pressure, CHF, edema    HPI:  Hypertension: controlled  Flat feet: dropped arch. Has some pain.  Chronic diastolic congestive heart failure: denies dyspnea.  UTI (urinary tract infection): foul odor of urine, but no dysuria  Hyperlipidemia: needs to be checked  Lumbago: has a brace, but does not wear it much due to the nuisance    Medications: Patient's Medications  New Prescriptions   No medications on file  Previous Medications   ASPIRIN 81 MG TABLET    Take 81 mg by mouth daily.     BETIMOL 0.5 % OPHTHALMIC SOLUTION    Place 1 drop into both eyes every morning.    CALCIUM CARBONATE (OS-CAL) 600 MG TABS    Take 600 mg by mouth daily.   CARVEDILOL (COREG) 3.125 MG TABLET    TAKE 1 TABLET (3.125 MG TOTAL) BY MOUTH 2 (TWO) TIMES DAILY WITH A MEAL.   CHOLECALCIFEROL (VITAMIN D3) 2000 UNITS TABS    Take 2,000 Units by mouth daily.     CYANOCOBALAMIN (B-12) 2000 MCG TABS    Take 2,000 mcg by mouth 3 (three) times a week. Monday, Wednesday, AND Friday   ESOMEPRAZOLE (NEXIUM) 40 MG CAPSULE    Take one capsule once daily for stomach   FUROSEMIDE (LASIX) 20 MG TABLET    TAKE 1 TABLET BY MOUTH DAILY   LATANOPROST (XALATAN) 0.005 % OPHTHALMIC SOLUTION    Place 1 drop into both eyes at bedtime.    LYRICA 50 MG CAPSULE    TAKE ONE CAPSULE BY MOUTH TWICE A DAY   MULTIPLE VITAMIN (MULTIVITAMIN) CAPSULE    Take 1 capsule by mouth daily.     OMEGA-3 FATTY ACIDS (FISH OIL) 1200 MG CAPS    Take 1,200 mg by mouth 2 (two) times daily.     POTASSIUM CHLORIDE (K-DUR,KLOR-CON) 10 MEQ TABLET    1 by mouth daily    TRAMADOL (ULTRAM) 50 MG TABLET    Take 50-100 mg by mouth 2 (two) times daily as  needed for pain.  Modified Medications   No medications on file  Discontinued Medications   No medications on file     Review of Systems  Constitutional: Negative.   HENT: Positive for hearing loss. Negative for ear pain.   Eyes: Negative.   Respiratory: Negative.   Cardiovascular: Positive for leg swelling. Negative for chest pain and palpitations.  Gastrointestinal: Negative.   Endocrine: Negative.   Genitourinary: Negative.   Musculoskeletal: Positive for back pain. Negative for myalgias, neck pain and neck stiffness.       Flat feet with dropped arches. Pin in the right medial malleolar bones.  Skin: Negative.   Neurological: Negative.   Hematological: Negative.   Psychiatric/Behavioral: Negative.     Filed Vitals:   04/08/13 1550  BP: 154/78  Pulse: 64  Weight: 146 lb (66.225 kg)  SpO2: 93%   Physical Exam  Constitutional: She is oriented to person, place, and time. She appears well-developed and well-nourished. No distress.  HENT:  Head: Normocephalic and atraumatic.  Right Ear: External ear normal.  Left Ear: External ear normal.  Nose: Nose normal.  Mouth/Throat: Oropharynx is clear and moist. No oropharyngeal exudate.  Eyes: Conjunctivae and EOM are normal. Pupils are equal, round, and reactive to light. Right eye exhibits no discharge. Left eye exhibits no discharge. No scleral icterus.  Neck: Normal range of motion. Neck supple. No JVD present. No thyromegaly present.  Cardiovascular: Normal rate and regular rhythm.   Murmur heard.  Systolic murmur is present with a grade of 2/6  Pulmonary/Chest: Effort normal and breath sounds normal. No respiratory distress. She has no wheezes. She has no rales. She exhibits no tenderness.  Abdominal: Soft. Bowel sounds are normal. She exhibits no distension. There is no tenderness. There is no rebound.  Musculoskeletal: Normal range of motion. She exhibits edema (1+  bipedal). She exhibits no tenderness.  Lymphadenopathy:     She has no cervical adenopathy.  Neurological: She is alert and oriented to person, place, and time. She has normal reflexes. No cranial nerve deficit. She exhibits normal muscle tone. Coordination normal.  Skin: Skin is warm and dry. No rash noted. She is not diaphoretic. No erythema.  Psychiatric: She has a normal mood and affect. Her behavior is normal. Judgment and thought content normal.     Labs reviewed: Nursing Home on 04/08/2013  Component Date Value Ref Range Status  . Glucose 03/30/2013 91   Final  . BUN 03/30/2013 20  4 - 21 mg/dL Final  . Creatinine 03/30/2013 1.2* 0.5 - 1.1 mg/dL Final  . Potassium 03/30/2013 4.2  3.4 - 5.3 mmol/L Final  . Sodium 03/30/2013 140  137 - 147 mmol/L Final      Assessment/Plan  1. Hypertension controlled  2. Flat feet Rec. Wearing arch supports  3. Chronic diastolic congestive heart failure controlled  4. UTI (urinary tract infection) Get UA and culture  5. Hyperlipidemia Check next visit  6. Lumbago Wear brace more often

## 2013-04-08 NOTE — Patient Instructions (Signed)
Continue current medications. 

## 2013-04-11 ENCOUNTER — Other Ambulatory Visit: Payer: Self-pay | Admitting: Internal Medicine

## 2013-04-13 ENCOUNTER — Other Ambulatory Visit: Payer: Self-pay | Admitting: *Deleted

## 2013-04-13 MED ORDER — NITROFURANTOIN MONOHYD MACRO 100 MG PO CAPS: ORAL_CAPSULE | ORAL | Status: DC

## 2013-04-13 NOTE — Telephone Encounter (Signed)
U/A Results from [facility] labs faxed to [place] from [facility]  Per Dr. Nyoka Cowden:  Macrobid 100mg  #14 One tablet twice daily for urine infection.  Patient Notified and faxed to [place]

## 2013-04-23 ENCOUNTER — Encounter: Payer: Self-pay | Admitting: Internal Medicine

## 2013-04-25 ENCOUNTER — Other Ambulatory Visit: Payer: Self-pay | Admitting: Internal Medicine

## 2013-04-29 ENCOUNTER — Encounter: Payer: Self-pay | Admitting: Internal Medicine

## 2013-05-10 ENCOUNTER — Other Ambulatory Visit: Payer: Self-pay | Admitting: Internal Medicine

## 2013-05-16 ENCOUNTER — Other Ambulatory Visit: Payer: Self-pay | Admitting: Nurse Practitioner

## 2013-08-08 ENCOUNTER — Other Ambulatory Visit: Payer: Self-pay | Admitting: Internal Medicine

## 2013-08-15 ENCOUNTER — Other Ambulatory Visit: Payer: Self-pay | Admitting: Internal Medicine

## 2013-08-22 ENCOUNTER — Other Ambulatory Visit: Payer: Self-pay | Admitting: Internal Medicine

## 2013-09-26 ENCOUNTER — Other Ambulatory Visit: Payer: Self-pay | Admitting: Internal Medicine

## 2013-09-28 LAB — BASIC METABOLIC PANEL
BUN: 15 mg/dL (ref 4–21)
CREATININE: 1.2 mg/dL — AB (ref 0.5–1.1)
Glucose: 89 mg/dL
Potassium: 4 mmol/L (ref 3.4–5.3)
SODIUM: 142 mmol/L (ref 137–147)

## 2013-09-28 LAB — HEPATIC FUNCTION PANEL
ALT: 10 U/L (ref 7–35)
AST: 17 U/L (ref 13–35)
Alkaline Phosphatase: 47 U/L (ref 25–125)
Bilirubin, Total: 0.6 mg/dL

## 2013-09-28 LAB — LIPID PANEL
CHOLESTEROL: 191 mg/dL (ref 0–200)
HDL: 62 mg/dL (ref 35–70)
LDL Cholesterol: 108 mg/dL
TRIGLYCERIDES: 104 mg/dL (ref 40–160)

## 2013-10-07 ENCOUNTER — Encounter: Payer: Self-pay | Admitting: Internal Medicine

## 2013-10-07 ENCOUNTER — Non-Acute Institutional Stay: Payer: Medicare Other | Admitting: Internal Medicine

## 2013-10-07 VITALS — BP 170/92 | HR 67 | Temp 97.8°F | Wt 141.0 lb

## 2013-10-07 DIAGNOSIS — R0600 Dyspnea, unspecified: Secondary | ICD-10-CM

## 2013-10-07 DIAGNOSIS — M545 Low back pain, unspecified: Secondary | ICD-10-CM

## 2013-10-07 DIAGNOSIS — F411 Generalized anxiety disorder: Secondary | ICD-10-CM

## 2013-10-07 DIAGNOSIS — R609 Edema, unspecified: Secondary | ICD-10-CM

## 2013-10-07 DIAGNOSIS — E785 Hyperlipidemia, unspecified: Secondary | ICD-10-CM

## 2013-10-07 DIAGNOSIS — M25519 Pain in unspecified shoulder: Secondary | ICD-10-CM

## 2013-10-07 DIAGNOSIS — R1031 Right lower quadrant pain: Secondary | ICD-10-CM

## 2013-10-07 DIAGNOSIS — R0609 Other forms of dyspnea: Secondary | ICD-10-CM

## 2013-10-07 DIAGNOSIS — R0989 Other specified symptoms and signs involving the circulatory and respiratory systems: Secondary | ICD-10-CM

## 2013-10-07 DIAGNOSIS — M25511 Pain in right shoulder: Secondary | ICD-10-CM

## 2013-10-07 DIAGNOSIS — I10 Essential (primary) hypertension: Secondary | ICD-10-CM

## 2013-10-07 NOTE — Progress Notes (Signed)
Patient ID: [name], female   DOB: [birth year], 80 y.o.   MRN: [id number]    Location:  [facility]   Place of Service: Clinic (12)    Allergies  Allergen Reactions  . Sulfa Antibiotics Itching    Chief Complaint  Patient presents with  . Medical Management of Chronic Issues    blood pressure, CHF,  cholesterol  . Shortness of Breath    at night  . Abdominal Pain    saw Dr. Collene Mares 10/06/13 for this    HPI:  Dyspnea: Short of breath with minimal exertion. May be worse at night.  Anxiety state: Chronic and unchanged  Edema: No developed after Lyrica was started. She is not sure this is really helping anything.  Midline low back pain without sciatica: Patient seen Dr. Nelva Bush. Back pain is chronic. She has also seen Dr. Bo Merino.  Pain in joint, shoulder region, right: Chronic sees Dr. Earlie Counts  Essential hypertension: Elevated systolic blood pressure and diastolic blood pressures today. She says it's normal at other offices.  RLQ abdominal pain: Has seen Dr. Juanita Craver. Patient has had previous colonoscopy that showed diverticulosis.    Medications: Patient's Medications  New Prescriptions   No medications on file  Previous Medications   ASPIRIN 81 MG TABLET    Take 81 mg by mouth daily.     BETIMOL 0.5 % OPHTHALMIC SOLUTION    Place 1 drop into both eyes every morning.    CALCIUM CARBONATE (OS-CAL) 600 MG TABS    Take 600 mg by mouth daily.   CARVEDILOL (COREG) 3.125 MG TABLET    TAKE 1 TABLET BY MOUTH TWICE A DAY WITH A MEAL   CHOLECALCIFEROL (VITAMIN D3) 2000 UNITS TABS    Take 2,000 Units by mouth daily.     CYANOCOBALAMIN (B-12) 2000 MCG TABS    Take 2,000 mcg by mouth 3 (three) times a week. Monday, Wednesday, AND Friday   ESOMEPRAZOLE (NEXIUM) 40 MG CAPSULE    Take one capsule once daily for stomach   FUROSEMIDE (LASIX) 20 MG TABLET    TAKE 1 TABLET DAILY   KLOR-CON M10 10 MEQ TABLET    TAKE 1 TABLET BY MOUTH EVERY DAY   LATANOPROST  (XALATAN) 0.005 % OPHTHALMIC SOLUTION    Place 1 drop into both eyes at bedtime.    LYRICA 50 MG CAPSULE    TAKE ONE CAPSULE BY MOUTH TWICE A DAY   MULTIPLE VITAMIN (MULTIVITAMIN) CAPSULE    Take 1 capsule by mouth daily.     OMEGA-3 FATTY ACIDS (FISH OIL) 1200 MG CAPS    Take 1,200 mg by mouth 2 (two) times daily.     TRAMADOL (ULTRAM) 50 MG TABLET    Take 50-100 mg by mouth 2 (two) times daily as needed for pain.  Modified Medications   No medications on file  Discontinued Medications   NITROFURANTOIN, MACROCRYSTAL-MONOHYDRATE, (MACROBID) 100 MG CAPSULE    Take one tablet by mouth twice daily for urine infection     Review of Systems  Constitutional: Negative.   HENT: Positive for hearing loss. Negative for ear pain.   Eyes: Negative.   Respiratory: Negative.   Cardiovascular: Positive for leg swelling. Negative for chest pain and palpitations.  Gastrointestinal:       Complains of chronic mild right lower quadrant abdominal pain. History of diverticulosis.  Endocrine: Negative.   Genitourinary: Negative.   Musculoskeletal: Positive for back pain and arthralgias. Negative for myalgias, neck  pain and neck stiffness.       Flat feet with dropped arches. Pin in the right medial malleolar bones. Chronic back pain. Chronic right shoulder pain.  Skin: Negative.   Neurological: Negative.   Hematological:       History of anemia.  Psychiatric/Behavioral: Negative.     Filed Vitals:   10/07/13 1617  BP: 170/92  Pulse: 67  Temp: 97.8 F (36.6 C)  TempSrc: Oral  Weight: 141 lb (63.957 kg)  SpO2: 97%   Body mass index is 22.08 kg/(m^2).  Physical Exam  Constitutional: She is oriented to person, place, and time. She appears well-developed and well-nourished. No distress.  HENT:  Head: Normocephalic and atraumatic.  Right Ear: External ear normal.  Left Ear: External ear normal.  Nose: Nose normal.  Mouth/Throat: Oropharynx is clear and moist. No oropharyngeal exudate.  Eyes:  Conjunctivae and EOM are normal. Pupils are equal, round, and reactive to light. Right eye exhibits no discharge. Left eye exhibits no discharge. No scleral icterus.  Neck: Normal range of motion. Neck supple. No JVD present. No thyromegaly present.  Cardiovascular: Normal rate and regular rhythm.   Murmur heard.  Systolic murmur is present with a grade of 2/6  Pulmonary/Chest: Effort normal and breath sounds normal. No respiratory distress. She has no wheezes. She has no rales. She exhibits no tenderness.  Abdominal: Soft. Bowel sounds are normal. She exhibits no distension. There is no tenderness. There is no rebound.  Musculoskeletal: Normal range of motion. She exhibits edema (1+  bipedal). She exhibits no tenderness.  Right shoulder discomfort with abduction and rotation.  Lymphadenopathy:    She has no cervical adenopathy.  Neurological: She is alert and oriented to person, place, and time. She has normal reflexes. No cranial nerve deficit. She exhibits normal muscle tone. Coordination normal.  Skin: Skin is warm and dry. No rash noted. She is not diaphoretic. No erythema.  Psychiatric: She has a normal mood and affect. Her behavior is normal. Judgment and thought content normal.     Labs reviewed: Nursing Home on 10/07/2013  Component Date Value Ref Range Status  . Glucose 09/28/2013 89   Final  . BUN 09/28/2013 15  4 - 21 mg/dL Final  . Creatinine 09/28/2013 1.2* 0.5 - 1.1 mg/dL Final  . Potassium 09/28/2013 4.0  3.4 - 5.3 mmol/L Final  . Sodium 09/28/2013 142  137 - 147 mmol/L Final  . Triglycerides 09/28/2013 104  40 - 160 mg/dL Final  . Cholesterol 09/28/2013 191  0 - 200 mg/dL Final  . HDL 09/28/2013 62  35 - 70 mg/dL Final  . LDL Cholesterol 09/28/2013 108   Final  . Alkaline Phosphatase 09/28/2013 47  25 - 125 U/L Final  . ALT 09/28/2013 10  7 - 35 U/L Final  . AST 09/28/2013 17  13 - 35 U/L Final  . Bilirubin, Total 09/28/2013 0.6   Final     Assessment/Plan 1.  Dyspnea Observe  2. Anxiety state Observe  3. Edema Discontinue Lyrica.  4. Midline low back pain without sciatica Continue with Dr. Nelva Bush  5. Pain in joint, shoulder region, right Continue with Dr. Veverly Fells  6. Essential hypertension: No medication changes this visit continue to observe  7. RLQ abdominal pain Mild chronic.  8. Hyperlipidemia controlled

## 2013-10-11 ENCOUNTER — Telehealth: Payer: Self-pay | Admitting: *Deleted

## 2013-10-11 NOTE — Telephone Encounter (Signed)
Patient called and stated that Dr. Nyoka Cowden took her off of Lyrica twice daily on Thursday and then by Saturday her Left foot was hurting so bad she couldn't put weight on it. She called on call doctor on Saturday and they told her to take one a day and call the office on Monday to let Dr. Nyoka Cowden know. Patient wants to know is it is ok to continue one a day? Please Advise.

## 2013-10-14 NOTE — Telephone Encounter (Signed)
Patient Notified and understood.

## 2013-10-14 NOTE — Telephone Encounter (Signed)
It is OK to continue to take one Lyrica daily.

## 2013-10-24 ENCOUNTER — Other Ambulatory Visit: Payer: Self-pay | Admitting: Internal Medicine

## 2013-12-06 ENCOUNTER — Other Ambulatory Visit: Payer: Self-pay | Admitting: Internal Medicine

## 2013-12-06 MED ORDER — CARVEDILOL 3.125 MG PO TABS: ORAL_TABLET | ORAL | Status: DC

## 2013-12-06 NOTE — Telephone Encounter (Signed)
[facility] 

## 2013-12-08 ENCOUNTER — Ambulatory Visit (HOSPITAL_COMMUNITY)
Admission: RE | Admit: 2013-12-08 | Discharge: 2013-12-08 | Disposition: A | Discharge status: Home / Self Care | Payer: Medicare Other | Source: Ambulatory Visit | Attending: Cardiovascular Disease | Admitting: Cardiovascular Disease

## 2013-12-08 ENCOUNTER — Other Ambulatory Visit (HOSPITAL_COMMUNITY): Payer: Self-pay | Admitting: Orthopedic Surgery

## 2013-12-08 DIAGNOSIS — M1711 Unilateral primary osteoarthritis, right knee: Secondary | ICD-10-CM

## 2013-12-08 DIAGNOSIS — M79604 Pain in right leg: Secondary | ICD-10-CM

## 2013-12-08 NOTE — Progress Notes (Signed)
Right Lower Extremity Venous Duplex Completed. No evidence for DVT or SVT. °Brianna L Mazza,RVT °

## 2013-12-17 ENCOUNTER — Other Ambulatory Visit: Payer: Self-pay | Admitting: Nurse Practitioner

## 2013-12-19 ENCOUNTER — Other Ambulatory Visit: Payer: Self-pay | Admitting: Nurse Practitioner

## 2013-12-26 ENCOUNTER — Other Ambulatory Visit: Payer: Self-pay | Admitting: Internal Medicine

## 2014-01-12 ENCOUNTER — Other Ambulatory Visit: Payer: Self-pay | Admitting: Internal Medicine

## 2014-01-16 ENCOUNTER — Encounter: Payer: Self-pay | Admitting: Internal Medicine

## 2014-01-16 DIAGNOSIS — R0600 Dyspnea, unspecified: Secondary | ICD-10-CM | POA: Insufficient documentation

## 2014-01-16 DIAGNOSIS — F411 Generalized anxiety disorder: Secondary | ICD-10-CM | POA: Insufficient documentation

## 2014-01-27 ENCOUNTER — Telehealth: Payer: Self-pay

## 2014-01-27 NOTE — Telephone Encounter (Signed)
Received note from [place] clinic nurse at [facility], patient has an appointment next week (12/17) with [facility] for c/o wt loss, no appetite, loss of energy and says her stomach "just does not feel good". Denies nausea or abd pain and denies dark stools. Wants an order to do labs prior to her appt. Pt continues her daily routines and is able to care for herself. V/s today T 97.6, pulse 72, R 20, BP 150/90, 02 97%. No resp distress, no DOE, pink palms, but pale lower eye lids. Reports loss of 20 lbs over 2 1/2 years. Per Dr. Nyoka Cowden wrote order for CBC, CMP, amylase, lipase, Sed Rate, TSH.  ICD codes I10, R53.81, R63.4, R63.0.

## 2014-02-03 ENCOUNTER — Non-Acute Institutional Stay: Payer: Medicare Other | Admitting: Nurse Practitioner

## 2014-02-03 ENCOUNTER — Encounter: Payer: Self-pay | Admitting: Nurse Practitioner

## 2014-02-03 VITALS — BP 164/100 | HR 88 | Temp 98.1°F | Wt 120.0 lb

## 2014-02-03 DIAGNOSIS — I5032 Chronic diastolic (congestive) heart failure: Secondary | ICD-10-CM

## 2014-02-03 DIAGNOSIS — M25561 Pain in right knee: Secondary | ICD-10-CM

## 2014-02-03 DIAGNOSIS — M5440 Lumbago with sciatica, unspecified side: Secondary | ICD-10-CM

## 2014-02-03 DIAGNOSIS — D513 Other dietary vitamin B12 deficiency anemia: Secondary | ICD-10-CM

## 2014-02-03 DIAGNOSIS — R634 Abnormal weight loss: Secondary | ICD-10-CM

## 2014-02-03 DIAGNOSIS — F411 Generalized anxiety disorder: Secondary | ICD-10-CM

## 2014-02-03 DIAGNOSIS — R609 Edema, unspecified: Secondary | ICD-10-CM

## 2014-02-03 DIAGNOSIS — K219 Gastro-esophageal reflux disease without esophagitis: Secondary | ICD-10-CM

## 2014-02-03 DIAGNOSIS — I6782 Cerebral ischemia: Secondary | ICD-10-CM

## 2014-02-03 DIAGNOSIS — E785 Hyperlipidemia, unspecified: Secondary | ICD-10-CM

## 2014-02-03 DIAGNOSIS — I10 Essential (primary) hypertension: Secondary | ICD-10-CM

## 2014-02-03 LAB — CBC AND DIFFERENTIAL
HCT: 36 % (ref 36–46)
HEMOGLOBIN: 12.3 g/dL (ref 12.0–16.0)
Platelets: 255 10*3/uL (ref 150–399)
WBC: 5.5 10*3/mL

## 2014-02-03 LAB — HEPATIC FUNCTION PANEL
ALT: 8 U/L (ref 7–35)
AST: 18 U/L (ref 13–35)
Alkaline Phosphatase: 49 U/L (ref 25–125)
Bilirubin, Total: 0.8 mg/dL

## 2014-02-03 LAB — BASIC METABOLIC PANEL
BUN: 15 mg/dL (ref 4–21)
CREATININE: 1.3 mg/dL — AB (ref 0.5–1.1)
GLUCOSE: 89 mg/dL
Potassium: 3.8 mmol/L (ref 3.4–5.3)
Sodium: 141 mmol/L (ref 137–147)

## 2014-02-03 LAB — TSH: TSH: 2.29 u[IU]/mL (ref 0.41–5.90)

## 2014-02-05 ENCOUNTER — Other Ambulatory Visit: Payer: Self-pay | Admitting: Internal Medicine

## 2014-02-06 DIAGNOSIS — M25561 Pain in right knee: Secondary | ICD-10-CM | POA: Insufficient documentation

## 2014-02-06 NOTE — Assessment & Plan Note (Signed)
02/03/14 Hgb 12.3. Takes Vit B12 2000 mcg 3x/wk.

## 2014-02-06 NOTE — Assessment & Plan Note (Signed)
Stated she sleeps well at night. Will start Mirtazapine 7.5mg  nightly for appetite in setting of possible anxiety/depression component in her weight loss.

## 2014-02-06 NOTE — Progress Notes (Signed)
Patient ID: [name], female   DOB: [birth year], 80 y.o.   MRN: [id number]   Allergies  Allergen Reactions  . Sulfa Antibiotics Itching    Chief Complaint  Patient presents with  . Weight Loss    no appetitie, feels like it won't go down when she eats.    HPI: Patient is a 80 y.o. female seen in the clinic at [facility] today for weight loss, CHF, HTN, GERD, and lower back/leg pain  Hospitalized 07/18/12-07/20/12 for Acute respiratory failure. Most likely 2ary to acute CHF exacerbation given history and elevated BNP-Furosemide 20mg  daily started. Echocardiogram shows a grade II diastolic dysfunction Troponins within normal limits on last check-elevated Troponin likely due to demand ischemia   Problem List Items Addressed This Visit    Right knee pain    Chronic right knee pain. S/p TKR-left. Takes Tramadol 50-100mg  bid prn for pain.     Lumbago    Takes Lyrica 50mg  bid and Tramadol prn for back and BLE neuropathic pain.     Loss of weight - Primary    #20 Ibs/past 4 months-#30Ibs/ past year. C/o lack of appetite and seems difficulty of swallowing. Saw Dr. Collene Mares GI 10/02/13-recommended Probiotics. S/p subtotal colectomy 1998 and Flexible sigmoidoscopy 2009 and will be 04/2015. Will try Mirtazapine 7.5mg  po nightly for appetite. F/u GI for weight loss and swallow evaluation. CBC, CMP, TSH, Amylase, Lipase unremarkable 02/03/14. CXR and UA C/S to evaluate further.     Hypertension (Chronic)    Controlled, takes Carvedilol 3.125mg , Furosemide 20mg -monitor Bps, ACEI was held dueto elevated Bun/creat while in hospital. May resume it if elevated Bp persists and Bun/creat improves. Creatinine 1.31 02/03/14     Hyperlipidemia (Chronic)    Takes fish oil.     GERD (gastroesophageal reflux disease)    C/o difficulty swallowing. F/u GI. Continue Nexium 40mg  po daily.     Edema (Chronic)    Trace only in BLE. No s/s of developing CHF    Chronic diastolic congestive heart failure  (Chronic)    Clinically compensated. Takes Furosemide 20mg  and Kcl 77meq daily. Bun/creat 15/1.31 02/03/14    Cerebral ischemia    No focal neurological symptoms noted. Takes ASA 81mg  daily.     Anxiety state    Stated she sleeps well at night. Will start Mirtazapine 7.5mg  nightly for appetite in setting of possible anxiety/depression component in her weight loss.     Anemia    02/03/14 Hgb 12.3. Takes Vit B12 2000 mcg 3x/wk.         Review of Systems:  Review of Systems  Constitutional: Positive for weight loss. Negative for fever, chills, malaise/fatigue and diaphoresis.       Down #30Ibs/past year and #20Ibs/past 4 months.   HENT: Positive for hearing loss. Negative for congestion, ear discharge, nosebleeds and sore throat.   Eyes: Negative for blurred vision, double vision, photophobia, pain, discharge and redness.  Respiratory: Negative for cough, sputum production, shortness of breath, wheezing and stridor.   Cardiovascular: Positive for leg swelling. Negative for chest pain, palpitations, orthopnea, claudication and PND.       Trace BLE  Gastrointestinal: Negative for heartburn, nausea, vomiting, abdominal pain, diarrhea, constipation and blood in stool.  Genitourinary: Positive for frequency. Negative for dysuria, urgency, hematuria and flank pain.  Musculoskeletal: Positive for back pain and joint pain. Negative for myalgias, falls and neck pain.       The right knee  Skin: Negative for itching and rash.  Left knee and abd surgical scars.   Neurological: Negative for dizziness, tingling, tremors, sensory change, speech change, focal weakness, seizures, loss of consciousness, weakness and headaches.  Endo/Heme/Allergies: Negative for environmental allergies and polydipsia. Does not bruise/bleed easily.  Psychiatric/Behavioral: Negative for depression, hallucinations and memory loss. The patient is not nervous/anxious and does not have insomnia.      Past Medical  History  Diagnosis Date  . Senile osteoporosis   . Heart murmur   . Small bowel obstruction   . GERD (gastroesophageal reflux disease)   . Hypertension   . Glaucoma   . Arthritis     Osteoarthritis  . Personal history of fall   . Edema   . Hyperlipidemia   . Diverticulosis of colon (without mention of hemorrhage)   . Other malaise and fatigue   . Hypertonicity of bladder   . Osteoarthrosis, unspecified whether generalized or localized, unspecified site   . Lumbago   . Edema   . Pain in joint, pelvic region and thigh   . Pain in joint, shoulder region   . Flat feet   . Cerebral ischemia 12/19/2012    Microvascular   . Cerebral atrophy 12/19/2012  . Cerebral atherosclerosis 12/19/2012  . RLQ abdominal pain 01/17/2011   Past Surgical History  Procedure Laterality Date  . Tonsillectomy    . Colon surgery  02/1996    Colectomy, partial for diverticular bleed 90%  . Joint replacement  12/2000    Left knee; Dr. Wynelle Link  . Breast lumpectomy Bilateral   . Laparoscopic lysis of adhesions  11/15/10    Exploratory   Social History:   reports that she quit smoking about 33 years ago. Her smoking use included Cigarettes. She has a 15 pack-year smoking history. She has never used smokeless tobacco. She reports that she drinks about 0.6 oz of alcohol per week. She reports that she does not use illicit drugs.  Family History  Problem Relation Age of Onset  . Cancer Father 11    colon    Medications: Patient's Medications  New Prescriptions   No medications on file  Previous Medications   ASPIRIN 81 MG TABLET    Take 81 mg by mouth daily.     BETIMOL 0.5 % OPHTHALMIC SOLUTION    Place 1 drop into both eyes every morning.    CALCIUM CARBONATE (OS-CAL) 600 MG TABS    Take 600 mg by mouth daily.   CARVEDILOL (COREG) 3.125 MG TABLET    Take one tablet by mouth twice daily with a meal   CHOLECALCIFEROL (VITAMIN D3) 2000 UNITS TABS    Take 2,000 Units by mouth daily.     CYANOCOBALAMIN  (B-12) 2000 MCG TABS    Take 2,000 mcg by mouth 3 (three) times a week. Monday, Wednesday, AND Friday   ESOMEPRAZOLE (NEXIUM) 40 MG CAPSULE    Take one capsule once daily for stomach   FUROSEMIDE (LASIX) 20 MG TABLET    TAKE 1 TABLET BY MOUTH ONCE DAILY   KLOR-CON M10 10 MEQ TABLET    TAKE 1 TABLET BY MOUTH EVERY DAY   LATANOPROST (XALATAN) 0.005 % OPHTHALMIC SOLUTION    Place 1 drop into both eyes at bedtime.    LYRICA 50 MG CAPSULE    TAKE ONE CAPSULE BY MOUTH TWICE A DAY   MULTIPLE VITAMIN (MULTIVITAMIN) CAPSULE    Take 1 capsule by mouth daily.     OMEGA-3 FATTY ACIDS (FISH OIL) 1200 MG CAPS  Take 1,200 mg by mouth 2 (two) times daily.     PROBIOTIC PRODUCT (RESTORA PO)    Take by mouth. Take one daily   TRAMADOL (ULTRAM) 50 MG TABLET    Take 50-100 mg by mouth 2 (two) times daily as needed for pain.  Modified Medications   No medications on file  Discontinued Medications   ESOMEPRAZOLE (NEXIUM) 40 MG CAPSULE    TAKE 1 CAPSULE EVERY DAY   ESOMEPRAZOLE (NEXIUM) 40 MG CAPSULE    TAKE 1 CAPSULE EVERY DAY     Physical Exam: Physical Exam  Constitutional: She is oriented to person, place, and time. She appears well-developed and well-nourished. No distress.  HENT:  Head: Normocephalic and atraumatic.  Right Ear: External ear normal.  Left Ear: External ear normal.  Nose: Nose normal.  Mouth/Throat: Oropharynx is clear and moist. No oropharyngeal exudate.  Eyes: Conjunctivae and EOM are normal. Pupils are equal, round, and reactive to light. Right eye exhibits no discharge. Left eye exhibits no discharge. No scleral icterus.  Neck: Normal range of motion. Neck supple. No JVD present. No thyromegaly present.  Cardiovascular: Normal rate and regular rhythm.   Murmur heard.  Systolic murmur is present with a grade of 2/6  Pulmonary/Chest: Effort normal and breath sounds normal. No respiratory distress. She has no wheezes. She has no rales. She exhibits no tenderness.  Abdominal: Soft.  Bowel sounds are normal. She exhibits no distension. There is no tenderness. There is no rebound.  Musculoskeletal: Normal range of motion. She exhibits edema (trace) and tenderness.  S/p L TKR. C/o R knee pain.  Lymphadenopathy:    She has no cervical adenopathy.  Neurological: She is alert and oriented to person, place, and time. She has normal reflexes. No cranial nerve deficit. She exhibits abnormal muscle tone. Coordination normal.  Skin: Skin is warm and dry. No rash noted. She is not diaphoretic. No erythema.  abd surgical scar 1998 subtotal colectomy. S/p TKR-left  Psychiatric: She has a normal mood and affect. Her behavior is normal. Judgment and thought content normal.    Filed Vitals:   02/03/14 1401  BP: 164/100  Pulse: 88  Temp: 98.1 F (36.7 C)  TempSrc: Oral  Weight: 120 lb (54.432 kg)      Labs reviewed: Basic Metabolic Panel:  Recent Labs  03/30/13 09/28/13 02/03/14  NA 140 142 141  K 4.2 4.0 3.8  BUN 20 15 15   CREATININE 1.2* 1.2* 1.3*  TSH  --   --  2.29   Liver Function Tests:  Recent Labs  09/28/13 02/03/14  AST 17 18  ALT 10 8  ALKPHOS 47 49   No results for input(s): LIPASE, AMYLASE in the last 8760 hours. No results for input(s): AMMONIA in the last 8760 hours. CBC:  Recent Labs  02/03/14  WBC 5.5  HGB 12.3  HCT 36  PLT 255   Lipid Panel:  Recent Labs  09/28/13  CHOL 191  HDL 62  LDLCALC 108  TRIG 104   Anemia Panel: No results for input(s): FOLATE, IRON, VITAMINB12 in the last 8760 hours.  Past Procedures:  02/2013 mammogram negative  11/2013 RLE negative for DVT  Assessment/Plan Loss of weight #20 Ibs/past 4 months-#30Ibs/ past year. C/o lack of appetite and seems difficulty of swallowing. Saw Dr. Collene Mares GI 10/02/13-recommended Probiotics. S/p subtotal colectomy 1998 and Flexible sigmoidoscopy 2009 and will be 04/2015. Will try Mirtazapine 7.5mg  po nightly for appetite. F/u GI for weight loss and swallow evaluation. CBC,  CMP, TSH, Amylase, Lipase unremarkable 02/03/14. CXR and UA C/S to evaluate further.   Right knee pain Chronic right knee pain. S/p TKR-left. Takes Tramadol 50-100mg  bid prn for pain.   Lumbago Takes Lyrica 50mg  bid and Tramadol prn for back and BLE neuropathic pain.   Chronic diastolic congestive heart failure Clinically compensated. Takes Furosemide 20mg  and Kcl 104meq daily. Bun/creat 15/1.31 02/03/14  Anemia 02/03/14 Hgb 12.3. Takes Vit B12 2000 mcg 3x/wk.    GERD (gastroesophageal reflux disease) C/o difficulty swallowing. F/u GI. Continue Nexium 40mg  po daily.   Cerebral ischemia No focal neurological symptoms noted. Takes ASA 81mg  daily.   Hypertension Controlled, takes Carvedilol 3.125mg , Furosemide 20mg -monitor Bps, ACEI was held dueto elevated Bun/creat while in hospital. May resume it if elevated Bp persists and Bun/creat improves. Creatinine 1.31 02/03/14   Anxiety state Stated she sleeps well at night. Will start Mirtazapine 7.5mg  nightly for appetite in setting of possible anxiety/depression component in her weight loss.   Edema Trace only in BLE. No s/s of developing CHF  Hyperlipidemia Takes fish oil.     Family/ Staff Communication:  Monitor weight and SOB  Goals of Care: [place]  Labs/tests ordered: CXR and UA C/S

## 2014-02-06 NOTE — Assessment & Plan Note (Signed)
Clinically compensated. Takes Furosemide 20mg  and Kcl 55meq daily. Bun/creat 15/1.31 02/03/14

## 2014-02-06 NOTE — Assessment & Plan Note (Signed)
Takes fish oil.

## 2014-02-06 NOTE — Assessment & Plan Note (Signed)
#  20 Ibs/past 4 months-#30Ibs/ past year. C/o lack of appetite and seems difficulty of swallowing. Saw Dr. Collene Mares GI 10/02/13-recommended Probiotics. S/p subtotal colectomy 1998 and Flexible sigmoidoscopy 2009 and will be 04/2015. Will try Mirtazapine 7.5mg  po nightly for appetite. F/u GI for weight loss and swallow evaluation. CBC, CMP, TSH, Amylase, Lipase unremarkable 02/03/14. CXR and UA C/S to evaluate further.

## 2014-02-06 NOTE — Assessment & Plan Note (Signed)
Takes Lyrica 50mg  bid and Tramadol prn for back and BLE neuropathic pain.

## 2014-02-06 NOTE — Assessment & Plan Note (Signed)
Trace only in BLE. No s/s of developing CHF

## 2014-02-06 NOTE — Assessment & Plan Note (Signed)
C/o difficulty swallowing. F/u GI. Continue Nexium 40mg  po daily.

## 2014-02-06 NOTE — Assessment & Plan Note (Signed)
No focal neurological symptoms noted. Takes ASA 81mg  daily.

## 2014-02-06 NOTE — Assessment & Plan Note (Signed)
Chronic right knee pain. S/p TKR-left. Takes Tramadol 50-100mg  bid prn for pain.

## 2014-02-06 NOTE — Assessment & Plan Note (Signed)
Controlled, takes Carvedilol 3.125mg , Furosemide 20mg -monitor Bps, ACEI was held dueto elevated Bun/creat while in hospital. May resume it if elevated Bp persists and Bun/creat improves. Creatinine 1.31 02/03/14

## 2014-02-10 ENCOUNTER — Other Ambulatory Visit: Payer: Self-pay | Admitting: Nurse Practitioner

## 2014-02-10 ENCOUNTER — Ambulatory Visit
Admission: RE | Admit: 2014-02-10 | Discharge: 2014-02-10 | Disposition: A | Discharge status: Home / Self Care | Payer: Medicare Other | Source: Ambulatory Visit | Attending: Nurse Practitioner | Admitting: Nurse Practitioner

## 2014-02-10 DIAGNOSIS — R634 Abnormal weight loss: Secondary | ICD-10-CM

## 2014-02-14 ENCOUNTER — Telehealth: Payer: Self-pay

## 2014-02-14 MED ORDER — MIRTAZAPINE 7.5 MG PO TABS: 7.5000 mg | ORAL_TABLET | Freq: Every day | ORAL | Status: DC

## 2014-02-14 NOTE — Telephone Encounter (Signed)
Tiffany from [facility] called, patient didn't get [facility], after seeing [facility] 02/03/14. [facility]. Left message on patients voice mail, Rx faxed in .

## 2014-02-15 ENCOUNTER — Other Ambulatory Visit: Payer: Self-pay | Admitting: *Deleted

## 2014-02-15 MED ORDER — MIRTAZAPINE 7.5 MG PO TABS: 7.5000 mg | ORAL_TABLET | Freq: Every day | ORAL | Status: DC

## 2014-02-15 NOTE — Telephone Encounter (Signed)
[place] Order Faxed

## 2014-02-21 ENCOUNTER — Other Ambulatory Visit: Payer: Self-pay | Admitting: *Deleted

## 2014-02-21 MED ORDER — LISINOPRIL 5 MG PO TABS: ORAL_TABLET | ORAL | Status: DC

## 2014-02-21 NOTE — Telephone Encounter (Signed)
Patient Notified and agreed. 

## 2014-02-21 NOTE — Telephone Encounter (Signed)
Received BP Readings from [facility] and BP's have been elevated even after taking Coreg. Per Dr. Reed--Lisinopril 5mg  one daily for hypertension. Recheck BMP in 1 week and continue to check BP daily and send results with BMP results in 1 week.  LMOM for patient to return call and faxed order back to [facility] with changes.

## 2014-02-23 ENCOUNTER — Other Ambulatory Visit: Payer: Self-pay | Admitting: *Deleted

## 2014-02-23 DIAGNOSIS — R634 Abnormal weight loss: Secondary | ICD-10-CM

## 2014-02-23 DIAGNOSIS — K219 Gastro-esophageal reflux disease without esophagitis: Secondary | ICD-10-CM

## 2014-03-01 LAB — BASIC METABOLIC PANEL
BUN: 29 mg/dL — AB (ref 4–21)
Creatinine: 1.2 mg/dL — AB (ref 0.5–1.1)
Potassium: 4.3 mmol/L (ref 3.4–5.3)
Sodium: 147 mmol/L (ref 137–147)

## 2014-03-02 ENCOUNTER — Encounter: Payer: Self-pay | Admitting: *Deleted

## 2014-03-02 ENCOUNTER — Encounter: Payer: Self-pay | Admitting: Nurse Practitioner

## 2014-03-02 DIAGNOSIS — S22050A Wedge compression fracture of T5-T6 vertebra, initial encounter for closed fracture: Secondary | ICD-10-CM | POA: Insufficient documentation

## 2014-03-14 ENCOUNTER — Encounter: Payer: Self-pay | Admitting: Internal Medicine

## 2014-03-14 ENCOUNTER — Non-Acute Institutional Stay (SKILLED_NURSING_FACILITY): Payer: Medicare Other | Admitting: Nurse Practitioner

## 2014-03-14 DIAGNOSIS — D649 Anemia, unspecified: Secondary | ICD-10-CM

## 2014-03-14 DIAGNOSIS — R634 Abnormal weight loss: Secondary | ICD-10-CM

## 2014-03-14 DIAGNOSIS — S22050S Wedge compression fracture of T5-T6 vertebra, sequela: Secondary | ICD-10-CM

## 2014-03-14 DIAGNOSIS — I5032 Chronic diastolic (congestive) heart failure: Secondary | ICD-10-CM

## 2014-03-14 DIAGNOSIS — R609 Edema, unspecified: Secondary | ICD-10-CM

## 2014-03-14 DIAGNOSIS — E785 Hyperlipidemia, unspecified: Secondary | ICD-10-CM

## 2014-03-14 DIAGNOSIS — F411 Generalized anxiety disorder: Secondary | ICD-10-CM

## 2014-03-14 DIAGNOSIS — I6782 Cerebral ischemia: Secondary | ICD-10-CM

## 2014-03-14 DIAGNOSIS — I10 Essential (primary) hypertension: Secondary | ICD-10-CM

## 2014-03-14 DIAGNOSIS — I639 Cerebral infarction, unspecified: Secondary | ICD-10-CM

## 2014-03-14 DIAGNOSIS — K219 Gastro-esophageal reflux disease without esophagitis: Secondary | ICD-10-CM

## 2014-03-14 DIAGNOSIS — M5440 Lumbago with sciatica, unspecified side: Secondary | ICD-10-CM

## 2014-03-14 NOTE — Assessment & Plan Note (Signed)
Stable

## 2014-03-14 NOTE — Assessment & Plan Note (Signed)
No focal neurological symptoms noted. Takes ASA 81mg  daily.

## 2014-03-14 NOTE — Assessment & Plan Note (Signed)
Trace only in BLE. No s/s of developing CHF. Continue Furosemide.

## 2014-03-14 NOTE — Assessment & Plan Note (Signed)
#  20 Ibs/past 4 months-#30Ibs/ past year. C/o lack of appetite and seems difficulty of swallowing. Saw Dr. Collene Mares GI 10/02/13-recommended Probiotics. S/p subtotal colectomy 1998 and Flexible sigmoidoscopy 2009 and will be 04/2015. Will try Mirtazapine 7.5mg  po nightly for appetite. F/u GI for weight loss and swallow evaluation. CBC, CMP, TSH, Amylase, Lipase unremarkable 02/03/14.  03/14/14 appetite and weight stable, continue Mirtazapine 7.5mg  nightly.

## 2014-03-14 NOTE — Assessment & Plan Note (Signed)
Stable, cont Mirtazapine 7.5mg nightly  

## 2014-03-14 NOTE — Assessment & Plan Note (Signed)
Takes Lyrica 50mg  bid and Tramadol prn for back and BLE neuropathic pain.

## 2014-03-14 NOTE — Assessment & Plan Note (Signed)
Per clinical presentation-the patient has sudden onset of left sided facial and limb weakness(improved with residual) associated slurred speech(improved), difficulty of finding words(improved, confusion(improved). She decided to come to [facility] for observation instead of ED evaluation over the weekend. She had TIA alike symptoms about 2 weeks ago at home per the patient today. Will CT head w/o CM, CBC, CMP, UA C/S, Carotid Doppler R+L, EKG as outpatient at [facility]. May consider Neurology if POA consents.

## 2014-03-14 NOTE — Assessment & Plan Note (Signed)
02/10/14 X-ray IMPRESSION: 1. No acute cardiopulmonary disease. Stable cardiomegaly. 2. Mid thoracic vertebral fracture, moderate severity. This appears to be T6. It is new from the prior chest radiograph. It could be acute, subacute or chronic.

## 2014-03-14 NOTE — Assessment & Plan Note (Signed)
C/o difficulty swallowing. F/u GI. Continue Nexium 40mg  po daily.

## 2014-03-14 NOTE — Assessment & Plan Note (Addendum)
Controlled, takes Carvedilol 3.125mg , Furosemide 20mg -monitor Bps, Lisinopril 5mg . Bun/creat improves. Creatinine 1.31 02/03/14

## 2014-03-14 NOTE — Progress Notes (Signed)
Patient ID: [name], female   DOB: 02-09-1926, 81 y.o.   MRN: [id number]   Code Status: DNR   Allergies  Allergen Reactions  . Sulfa Antibiotics Itching    Chief Complaint  Patient presents with  . Medical Management of Chronic Issues  . Hospitalization Follow-up    left facial and leg weakness.     HPI: Patient is a 81 y.o. female seen in the SNF at [facility] today for left sided weakness, confusion, and chronic medical issues.    Hospitalized 07/18/12-07/20/12 for Acute respiratory failure. Most likely 2ary to acute CHF exacerbation given history and elevated BNP-Furosemide 20mg  daily started. Echocardiogram shows a grade II diastolic dysfunction Troponins within normal limits on last check-elevated Troponin likely due to demand ischemia   Problem List Items Addressed This Visit    Wedge compression fracture of T6 vertebra    02/10/14 X-ray IMPRESSION: 1. No acute cardiopulmonary disease. Stable cardiomegaly. 2. Mid thoracic vertebral fracture, moderate severity. This appears to be T6. It is new from the prior chest radiograph. It could be acute, subacute or chronic.        Lumbago    Takes Lyrica 50mg  bid and Tramadol prn for back and BLE neuropathic pain.        Loss of weight    #20 Ibs/past 4 months-#30Ibs/ past year. C/o lack of appetite and seems difficulty of swallowing. Saw Dr. Collene Mares GI 10/02/13-recommended Probiotics. S/p subtotal colectomy 1998 and Flexible sigmoidoscopy 2009 and will be 04/2015. Will try Mirtazapine 7.5mg  po nightly for appetite. F/u GI for weight loss and swallow evaluation. CBC, CMP, TSH, Amylase, Lipase unremarkable 02/03/14.  03/14/14 appetite and weight stable, continue Mirtazapine 7.5mg  nightly.       Hypertension (Chronic)    Controlled, takes Carvedilol 3.125mg , Furosemide 20mg -monitor Bps, Lisinopril 5mg . Bun/creat improves. Creatinine 1.31 02/03/14       Relevant Medications   atorvastatin (LIPITOR) 20 MG tablet     Hyperlipidemia (Chronic)    Takes fish oil. Will update lipid panel. Adding Atorvastatin 20mg  daily since onset of the CVA       Relevant Medications   atorvastatin (LIPITOR) 20 MG tablet   GERD (gastroesophageal reflux disease)    C/o difficulty swallowing. F/u GI. Continue Nexium 40mg  po daily.        Edema (Chronic)    Trace only in BLE. No s/s of developing CHF. Continue Furosemide.        CVA (cerebral vascular accident) - Primary    Per clinical presentation-the patient has sudden onset of left sided facial and limb weakness(improved with residual) associated slurred speech(improved), difficulty of finding words(improved, confusion(improved). She decided to come to SNF [facility] for observation instead of ED evaluation over the weekend. She had TIA alike symptoms about 2 weeks ago at home per the patient today. Will CT head w/o CM, CBC, CMP, UA C/S, Carotid Doppler R+L, EKG as outpatient at [facility]. May consider Neurology if POA consents.       Relevant Medications   atorvastatin (LIPITOR) 20 MG tablet   Chronic diastolic congestive heart failure (Chronic)    Clinically compensated. Takes Furosemide 20mg  and Kcl 50meq daily. Bun/creat 15/1.31 02/03/14       Relevant Medications   atorvastatin (LIPITOR) 20 MG tablet   Cerebral ischemia    No focal neurological symptoms noted. Takes ASA 81mg  daily.        Relevant Medications   atorvastatin (LIPITOR) 20 MG tablet   Anxiety state  Stable, cont Mirtazapine 7.5mg  nightly        Anemia    Stable.          Review of Systems:  Review of Systems  Constitutional: Negative for fever, chills, weight loss, malaise/fatigue and diaphoresis.       Left sideded  HENT: Positive for hearing loss. Negative for congestion, ear discharge, ear pain, nosebleeds, sore throat and tinnitus.   Eyes: Negative for blurred vision, double vision, photophobia, pain, discharge and redness.  Respiratory: Negative for cough, hemoptysis,  sputum production, shortness of breath, wheezing and stridor.   Cardiovascular: Negative for palpitations, orthopnea, claudication, leg swelling and PND.  Gastrointestinal: Negative for heartburn, nausea, vomiting, abdominal pain, diarrhea, constipation, blood in stool and melena.  Genitourinary: Negative for dysuria, urgency, frequency, hematuria and flank pain.  Musculoskeletal: Negative for myalgias, back pain, joint pain, falls and neck pain.  Skin: Negative for itching and rash.  Neurological: Positive for speech change, focal weakness, loss of consciousness and weakness. Negative for dizziness, tingling, tremors, sensory change, seizures and headaches.       Confusion, difficulty finding words, left sided facial and limbs weakness.   Endo/Heme/Allergies: Negative for environmental allergies and polydipsia. Does not bruise/bleed easily.  Psychiatric/Behavioral: Positive for memory loss. Negative for depression, suicidal ideas, hallucinations and substance abuse. The patient is not nervous/anxious and does not have insomnia.      Past Medical History  Diagnosis Date  . Senile osteoporosis   . Heart murmur   . Small bowel obstruction   . GERD (gastroesophageal reflux disease)   . Hypertension   . Glaucoma   . Arthritis     Osteoarthritis  . Personal history of fall   . Edema   . Hyperlipidemia   . Diverticulosis of colon (without mention of hemorrhage)   . Other malaise and fatigue   . Hypertonicity of bladder   . Osteoarthrosis, unspecified whether generalized or localized, unspecified site   . Lumbago   . Edema   . Pain in joint, pelvic region and thigh   . Pain in joint, shoulder region   . Flat feet   . Cerebral ischemia 12/19/2012    Microvascular   . Cerebral atrophy 12/19/2012  . Cerebral atherosclerosis 12/19/2012  . RLQ abdominal pain 01/17/2011   Past Surgical History  Procedure Laterality Date  . Tonsillectomy    . Colon surgery  02/1996    Colectomy, partial  for diverticular bleed 90%  . Joint replacement  12/2000    Left knee; Dr. Wynelle Link  . Breast lumpectomy Bilateral   . Laparoscopic lysis of adhesions  11/15/10    Exploratory   Social History:   reports that she quit smoking about 33 years ago. Her smoking use included Cigarettes. She has a 15 pack-year smoking history. She has never used smokeless tobacco. She reports that she drinks about 0.6 oz of alcohol per week. She reports that she does not use illicit drugs.  Family History  Problem Relation Age of Onset  . Cancer Father 4    colon    Medications: Patient's Medications  New Prescriptions   No medications on file  Previous Medications   ASPIRIN 81 MG TABLET    Take 81 mg by mouth daily.     ATORVASTATIN (LIPITOR) 20 MG TABLET    Take 20 mg by mouth daily.   BETIMOL 0.5 % OPHTHALMIC SOLUTION    Place 1 drop into both eyes every morning.    CALCIUM CARBONATE (OS-CAL) 600  MG TABS    Take 600 mg by mouth daily.   CARVEDILOL (COREG) 3.125 MG TABLET    Take one tablet by mouth twice daily with a meal   CHOLECALCIFEROL (VITAMIN D3) 2000 UNITS TABS    Take 2,000 Units by mouth daily.     CYANOCOBALAMIN (B-12) 2000 MCG TABS    Take 2,000 mcg by mouth 3 (three) times a week. Monday, Wednesday, AND Friday   ESOMEPRAZOLE (NEXIUM) 40 MG CAPSULE    Take one capsule once daily for stomach   FUROSEMIDE (LASIX) 20 MG TABLET    TAKE 1 TABLET BY MOUTH ONCE DAILY   KLOR-CON M10 10 MEQ TABLET    TAKE 1 TABLET BY MOUTH EVERY DAY   LATANOPROST (XALATAN) 0.005 % OPHTHALMIC SOLUTION    Place 1 drop into both eyes at bedtime.    LISINOPRIL (PRINIVIL,ZESTRIL) 5 MG TABLET    Take one tablet by mouth once daily for hypertension   LYRICA 50 MG CAPSULE    TAKE ONE CAPSULE BY MOUTH TWICE A DAY   MIRTAZAPINE (REMERON) 7.5 MG TABLET    Take 1 tablet (7.5 mg total) by mouth at bedtime. Nightly for appetite   MULTIPLE VITAMIN (MULTIVITAMIN) CAPSULE    Take 1 capsule by mouth daily.     OMEGA-3 FATTY ACIDS  (FISH OIL) 1200 MG CAPS    Take 1,200 mg by mouth 2 (two) times daily.     PROBIOTIC PRODUCT (RESTORA PO)    Take by mouth. Take one daily   TRAMADOL (ULTRAM) 50 MG TABLET    Take 50-100 mg by mouth 2 (two) times daily as needed for pain.  Modified Medications   No medications on file  Discontinued Medications   No medications on file     Physical Exam: Physical Exam  Constitutional: She is oriented to person, place, and time. She appears well-developed and well-nourished. No distress.  HENT:  Head: Normocephalic and atraumatic.  Right Ear: External ear normal.  Left Ear: External ear normal.  Nose: Nose normal.  Mouth/Throat: Oropharynx is clear and moist. No oropharyngeal exudate.  Eyes: Conjunctivae and EOM are normal. Right eye exhibits no discharge. Left eye exhibits no discharge. No scleral icterus. Right pupil is round and reactive. Left pupil is round. Pupils are equal.  Left pupil slow reaction.   Neck: Normal range of motion. Neck supple. No JVD present. No thyromegaly present.  Cardiovascular: Normal rate and regular rhythm.   Murmur heard.  Systolic murmur is present with a grade of 2/6  Pulmonary/Chest: Effort normal and breath sounds normal. No respiratory distress. She has no wheezes. She has no rales. She exhibits no tenderness.  Abdominal: Soft. Bowel sounds are normal. She exhibits no distension. There is no tenderness. There is no rebound.  Musculoskeletal: Normal range of motion. She exhibits edema (trace) and tenderness.  S/p L TKR. C/o R knee pain.  Lymphadenopathy:    She has no cervical adenopathy.  Neurological: She is alert and oriented to person, place, and time. She has normal reflexes. No cranial nerve deficit. She exhibits abnormal muscle tone. Coordination normal.  Skin: Skin is warm and dry. No rash noted. She is not diaphoretic. No erythema.  abd surgical scar 1998 subtotal colectomy. S/p TKR-left  Psychiatric: She has a normal mood and affect. Her  behavior is normal. Judgment and thought content normal.    Filed Vitals:   03/14/14 1535  BP: 138/74  Pulse: 75  Temp: 97.1 F (36.2 C)  TempSrc:  Tympanic  Resp: 20      Labs reviewed: Basic Metabolic Panel:  Recent Labs  09/28/13 02/03/14 03/01/14  NA 142 141 147  K 4.0 3.8 4.3  BUN 15 15 29*  CREATININE 1.2* 1.3* 1.2*  TSH  --  2.29  --    Liver Function Tests:  Recent Labs  09/28/13 02/03/14  AST 17 18  ALT 10 8  ALKPHOS 47 49   No results for input(s): LIPASE, AMYLASE in the last 8760 hours. No results for input(s): AMMONIA in the last 8760 hours. CBC:  Recent Labs  02/03/14  WBC 5.5  HGB 12.3  HCT 36  PLT 255   Lipid Panel:  Recent Labs  09/28/13  CHOL 191  HDL 62  LDLCALC 108  TRIG 104   Anemia Panel: No results for input(s): FOLATE, IRON, VITAMINB12 in the last 8760 hours.  Past Procedures:  02/2013 mammogram negative  11/2013 RLE negative for DVT  Assessment/Plan CVA (cerebral vascular accident) Per clinical presentation-the patient has sudden onset of left sided facial and limb weakness(improved with residual) associated slurred speech(improved), difficulty of finding words(improved, confusion(improved). She decided to come to SNF [facility] for observation instead of ED evaluation over the weekend. She had TIA alike symptoms about 2 weeks ago at home per the patient today. Will CT head w/o CM, CBC, CMP, UA C/S, Carotid Doppler R+L, EKG as outpatient at [facility]. May consider Neurology if POA consents.    Wedge compression fracture of T6 vertebra 02/10/14 X-ray IMPRESSION: 1. No acute cardiopulmonary disease. Stable cardiomegaly. 2. Mid thoracic vertebral fracture, moderate severity. This appears to be T6. It is new from the prior chest radiograph. It could be acute, subacute or chronic.     Lumbago Takes Lyrica 50mg  bid and Tramadol prn for back and BLE neuropathic pain.     Loss of weight #20 Ibs/past 4 months-#30Ibs/ past year.  C/o lack of appetite and seems difficulty of swallowing. Saw Dr. Collene Mares GI 10/02/13-recommended Probiotics. S/p subtotal colectomy 1998 and Flexible sigmoidoscopy 2009 and will be 04/2015. Will try Mirtazapine 7.5mg  po nightly for appetite. F/u GI for weight loss and swallow evaluation. CBC, CMP, TSH, Amylase, Lipase unremarkable 02/03/14.  03/14/14 appetite and weight stable, continue Mirtazapine 7.5mg  nightly.    Hypertension Controlled, takes Carvedilol 3.125mg , Furosemide 20mg -monitor Bps, Lisinopril 5mg . Bun/creat improves. Creatinine 1.31 02/03/14    Hyperlipidemia Takes fish oil. Will update lipid panel. Adding Atorvastatin 20mg  daily since onset of the CVA    GERD (gastroesophageal reflux disease) C/o difficulty swallowing. F/u GI. Continue Nexium 40mg  po daily.     Edema Trace only in BLE. No s/s of developing CHF. Continue Furosemide.     Chronic diastolic congestive heart failure Clinically compensated. Takes Furosemide 20mg  and Kcl 24meq daily. Bun/creat 15/1.31 02/03/14    Cerebral ischemia No focal neurological symptoms noted. Takes ASA 81mg  daily.     Anxiety state Stable, cont Mirtazapine 7.5mg  nightly     Anemia Stable.      Family/ Staff Communication:  Rehab   Goals of Care: AL  Labs/tests ordered: CBC, CMP, Lipids, UA C/S, carotid US R+L, EKG, CT head w/o contrast.

## 2014-03-14 NOTE — Assessment & Plan Note (Signed)
Clinically compensated. Takes Furosemide 20mg  and Kcl 64meq daily. Bun/creat 15/1.31 02/03/14

## 2014-03-14 NOTE — Assessment & Plan Note (Signed)
Takes fish oil. Will update lipid panel. Adding Atorvastatin 20mg  daily since onset of the CVA

## 2014-03-15 ENCOUNTER — Other Ambulatory Visit: Payer: Self-pay | Admitting: Internal Medicine

## 2014-03-15 DIAGNOSIS — R531 Weakness: Secondary | ICD-10-CM

## 2014-03-15 LAB — HEPATIC FUNCTION PANEL
ALT: 8 U/L (ref 7–35)
AST: 18 U/L (ref 13–35)
Alkaline Phosphatase: 48 U/L (ref 25–125)
Bilirubin, Total: 0.7 mg/dL

## 2014-03-15 LAB — LIPID PANEL
Cholesterol: 190 mg/dL (ref 0–200)
HDL: 54 mg/dL (ref 35–70)
LDL Cholesterol: 21 mg/dL
Triglycerides: 104 mg/dL (ref 40–160)

## 2014-03-15 LAB — CBC AND DIFFERENTIAL
HEMATOCRIT: 38 % (ref 36–46)
Hemoglobin: 12.6 g/dL (ref 12.0–16.0)
Platelets: 234 10*3/uL (ref 150–399)
WBC: 6.6 10^3/mL

## 2014-03-15 LAB — BASIC METABOLIC PANEL
BUN: 27 mg/dL — AB (ref 4–21)
CREATININE: 2.2 mg/dL — AB (ref 0.5–1.1)
GLUCOSE: 82 mg/dL
Potassium: 4.4 mmol/L (ref 3.4–5.3)
SODIUM: 140 mmol/L (ref 137–147)

## 2014-03-16 ENCOUNTER — Non-Acute Institutional Stay (SKILLED_NURSING_FACILITY): Payer: Medicare Other | Admitting: Nurse Practitioner

## 2014-03-16 ENCOUNTER — Other Ambulatory Visit: Payer: Self-pay | Admitting: Internal Medicine

## 2014-03-16 ENCOUNTER — Encounter: Payer: Self-pay | Admitting: Internal Medicine

## 2014-03-16 ENCOUNTER — Ambulatory Visit
Admission: RE | Admit: 2014-03-16 | Discharge: 2014-03-16 | Disposition: A | Discharge status: Home / Self Care | Payer: Medicare Other | Source: Ambulatory Visit | Attending: Internal Medicine | Admitting: Internal Medicine

## 2014-03-16 ENCOUNTER — Other Ambulatory Visit: Payer: Self-pay | Admitting: Nurse Practitioner

## 2014-03-16 DIAGNOSIS — I639 Cerebral infarction, unspecified: Secondary | ICD-10-CM

## 2014-03-16 DIAGNOSIS — K219 Gastro-esophageal reflux disease without esophagitis: Secondary | ICD-10-CM

## 2014-03-16 DIAGNOSIS — F411 Generalized anxiety disorder: Secondary | ICD-10-CM

## 2014-03-16 DIAGNOSIS — R531 Weakness: Secondary | ICD-10-CM

## 2014-03-16 DIAGNOSIS — I10 Essential (primary) hypertension: Secondary | ICD-10-CM

## 2014-03-16 DIAGNOSIS — R609 Edema, unspecified: Secondary | ICD-10-CM

## 2014-03-16 DIAGNOSIS — E785 Hyperlipidemia, unspecified: Secondary | ICD-10-CM

## 2014-03-16 DIAGNOSIS — N189 Chronic kidney disease, unspecified: Secondary | ICD-10-CM

## 2014-03-16 DIAGNOSIS — I5032 Chronic diastolic (congestive) heart failure: Secondary | ICD-10-CM

## 2014-03-16 DIAGNOSIS — N179 Acute kidney failure, unspecified: Secondary | ICD-10-CM

## 2014-03-16 DIAGNOSIS — N289 Disorder of kidney and ureter, unspecified: Principal | ICD-10-CM

## 2014-03-16 NOTE — Assessment & Plan Note (Addendum)
In setting of hx of cerebral atherosclerosis-left facial and limb weakness.  03/15/14 US carotid: minimal plaque. No significant carotid artery stenosis.  03/15/14 EKG SR 03/16/14 CT head w/o contrast:  IMPRESSION: No acute intracranial abnormality. Multiple lacunar infarcts, some of which are new since 10/20/2012 but not felt to be acute.  Stable chronic atrophy.  Will discuss ASA vs Xarelto if creatinine returns to her baseline. Creat 2.17 03/15/14-hold Furosemide and Kcl-repeat BMP.

## 2014-03-16 NOTE — Assessment & Plan Note (Signed)
Trace only in BLE. No s/s of developing CHF. Hold Furosemide 03/16/14 creat 2.17

## 2014-03-16 NOTE — Progress Notes (Signed)
Patient ID: [name], female   DOB: 01-28-1926, 81 y.o.   MRN: [id number]   Patient ID: [name], female   DOB: 1925/05/18, 81 y.o.   MRN: [id number]   Code Status: DNR   Allergies  Allergen Reactions  . Sulfa Antibiotics Itching    Chief Complaint  Patient presents with  . New Admit To SNF    left facial and leg weakness--here for stroke workup    HPI: Patient is a 81 y.o. female seen in the SNF at [facility] today for left sided weakness, confusion, and chronic medical issues.    Hospitalized 07/18/12-07/20/12 for Acute respiratory failure. Most likely 2ary to acute CHF exacerbation given history and elevated BNP-Furosemide 20mg  daily started. Echocardiogram shows a grade II diastolic dysfunction Troponins within normal limits on last check-elevated Troponin likely due to demand ischemia   Problem List Items Addressed This Visit    None      Review of Systems:  ROS   Past Medical History  Diagnosis Date  . Senile osteoporosis   . Heart murmur   . Small bowel obstruction   . GERD (gastroesophageal reflux disease)   . Hypertension   . Glaucoma   . Arthritis     Osteoarthritis  . Personal history of fall   . Edema   . Hyperlipidemia   . Diverticulosis of colon (without mention of hemorrhage)   . Other malaise and fatigue   . Hypertonicity of bladder   . Osteoarthrosis, unspecified whether generalized or localized, unspecified site   . Lumbago   . Edema   . Pain in joint, pelvic region and thigh   . Pain in joint, shoulder region   . Flat feet   . Cerebral ischemia 12/19/2012    Microvascular   . Cerebral atrophy 12/19/2012  . Cerebral atherosclerosis 12/19/2012  . RLQ abdominal pain 01/17/2011   Past Surgical History  Procedure Laterality Date  . Tonsillectomy    . Colon surgery  02/1996    Colectomy, partial for diverticular bleed 90%  . Joint replacement  12/2000    Left knee; Dr. Wynelle Link  . Breast lumpectomy Bilateral   . Laparoscopic  lysis of adhesions  11/15/10    Exploratory   Social History:   reports that she quit smoking about 33 years ago. Her smoking use included Cigarettes. She has a 15 pack-year smoking history. She has never used smokeless tobacco. She reports that she drinks about 0.6 oz of alcohol per week. She reports that she does not use illicit drugs.  Family History  Problem Relation Age of Onset  . Cancer Father 42    colon    Medications: Patient's Medications  New Prescriptions   No medications on file  Previous Medications   ASPIRIN 81 MG TABLET    Take 81 mg by mouth daily.     ATORVASTATIN (LIPITOR) 20 MG TABLET    Take 20 mg by mouth daily.   BETIMOL 0.5 % OPHTHALMIC SOLUTION    Place 1 drop into both eyes every morning.    CALCIUM CARBONATE (OS-CAL) 600 MG TABS    Take 600 mg by mouth daily.   CARVEDILOL (COREG) 3.125 MG TABLET    TAKE 1 TABLET BY MOUTH TWICE A DAY WITH A MEAL   CHOLECALCIFEROL (VITAMIN D3) 2000 UNITS TABS    Take 2,000 Units by mouth daily.     CYANOCOBALAMIN (B-12) 2000 MCG TABS    Take 2,000 mcg by mouth 3 (three)  times a week. Monday, Wednesday, AND Friday   ESOMEPRAZOLE (NEXIUM) 40 MG CAPSULE    Take one capsule once daily for stomach   FUROSEMIDE (LASIX) 20 MG TABLET    TAKE 1 TABLET BY MOUTH ONCE DAILY   KLOR-CON M10 10 MEQ TABLET    TAKE 1 TABLET BY MOUTH EVERY DAY   LATANOPROST (XALATAN) 0.005 % OPHTHALMIC SOLUTION    Place 1 drop into both eyes at bedtime.    LISINOPRIL (PRINIVIL,ZESTRIL) 5 MG TABLET    Take one tablet by mouth once daily for hypertension   LYRICA 50 MG CAPSULE    TAKE ONE CAPSULE BY MOUTH TWICE A DAY   MIRTAZAPINE (REMERON) 7.5 MG TABLET    TAKE 1 TABLET (7.5 MG TOTAL) BY MOUTH AT BEDTIME. NIGHTLY FOR APPETITE   MULTIPLE VITAMIN (MULTIVITAMIN) CAPSULE    Take 1 capsule by mouth daily.     OMEGA-3 FATTY ACIDS (FISH OIL) 1200 MG CAPS    Take 1,200 mg by mouth 2 (two) times daily.     PROBIOTIC PRODUCT (RESTORA PO)    Take by mouth. Take one daily     TRAMADOL (ULTRAM) 50 MG TABLET    Take 50-100 mg by mouth 2 (two) times daily as needed for pain.  Modified Medications   No medications on file  Discontinued Medications   No medications on file     Physical Exam: Physical Exam  Constitutional: She is oriented to person, place, and time. She appears well-developed and well-nourished. No distress.  HENT:  Head: Normocephalic and atraumatic.  Right Ear: External ear normal.  Left Ear: External ear normal.  Nose: Nose normal.  Mouth/Throat: Oropharynx is clear and moist. No oropharyngeal exudate.  Eyes: Conjunctivae and EOM are normal. Right eye exhibits no discharge. Left eye exhibits no discharge. No scleral icterus. Right pupil is round and reactive. Left pupil is round. Pupils are equal.  Left pupil slow reaction.   Neck: Normal range of motion. Neck supple. No JVD present. No thyromegaly present.  Cardiovascular: Normal rate and regular rhythm.   Murmur heard.  Systolic murmur is present with a grade of 2/6  Pulmonary/Chest: Effort normal and breath sounds normal. No respiratory distress. She has no wheezes. She has no rales. She exhibits no tenderness.  Abdominal: Soft. Bowel sounds are normal. She exhibits no distension. There is no tenderness. There is no rebound.  Musculoskeletal: Normal range of motion. She exhibits edema (trace) and tenderness.  S/p L TKR. C/o R knee pain.  Lymphadenopathy:    She has no cervical adenopathy.  Neurological: She is alert and oriented to person, place, and time. She has normal reflexes. No cranial nerve deficit. She exhibits abnormal muscle tone. Coordination normal.  Skin: Skin is warm and dry. No rash noted. She is not diaphoretic. No erythema.  abd surgical scar 1998 subtotal colectomy. S/p TKR-left  Psychiatric: She has a normal mood and affect. Her behavior is normal. Judgment and thought content normal.    There were no vitals filed for this visit.    Labs reviewed: Basic  Metabolic Panel:  Recent Labs  09/28/13 02/03/14 03/01/14  NA 142 141 147  K 4.0 3.8 4.3  BUN 15 15 29*  CREATININE 1.2* 1.3* 1.2*  TSH  --  2.29  --    Liver Function Tests:  Recent Labs  09/28/13 02/03/14  AST 17 18  ALT 10 8  ALKPHOS 47 49   No results for input(s): LIPASE, AMYLASE in the last  8760 hours. No results for input(s): AMMONIA in the last 8760 hours. CBC:  Recent Labs  02/03/14  WBC 5.5  HGB 12.3  HCT 36  PLT 255   Lipid Panel:  Recent Labs  09/28/13  CHOL 191  HDL 62  LDLCALC 108  TRIG 104   Anemia Panel: No results for input(s): FOLATE, IRON, VITAMINB12 in the last 8760 hours.  Past Procedures:  02/2013 mammogram negative  11/2013 RLE negative for DVT  Assessment/Plan No problem-specific assessment & plan notes found for this encounter.   Family/ Staff Communication:  Rehab   Goals of Care: AL  Labs/tests ordered: CBC, CMP, Lipids, UA C/S, carotid US R+L, EKG, CT head w/o contrast.        This encounter was created in error - please disregard.

## 2014-03-16 NOTE — Assessment & Plan Note (Signed)
03/15/14 LDL 115. Continue Statin.

## 2014-03-16 NOTE — Assessment & Plan Note (Signed)
Baseline creat 1.2-1.3. 03/15/14 creat 2.17. Hold Furosemide. Repeat BMP

## 2014-03-16 NOTE — Assessment & Plan Note (Signed)
Clinically compensated. 03/16/14 hold Furosemide 20mg  and Kcl 63meq daily. Bun/creat 15/1.31 02/03/14 and 27/2.17 03/15/14

## 2014-03-16 NOTE — Progress Notes (Signed)
Patient ID: [name], female   DOB: [birth year], 81 y.o.   MRN: [id number]   Patient ID: [name], female   DOB: December 27, 1925, 81 y.o.   MRN: [id number]   Code Status: DNR   Allergies  Allergen Reactions  . Sulfa Antibiotics Itching    Chief Complaint  Patient presents with  . Medical Management of Chronic Issues  . Acute Visit    CVA, CKD    HPI: Patient is a 81 y.o. female seen in the SNF at [facility] today for left sided weakness, confusion, and chronic medical issues.    Hospitalized 07/18/12-07/20/12 for Acute respiratory failure. Most likely 2ary to acute CHF exacerbation given history and elevated BNP-Furosemide 20mg  daily started. Echocardiogram shows a grade II diastolic dysfunction Troponins within normal limits on last check-elevated Troponin likely due to demand ischemia   Problem List Items Addressed This Visit    Hypertension - Primary (Chronic)    Controlled, takes Carvedilol 3.125mg , Furosemide(held 03/16/14 creatinine 2.17), Lisinopril 5mg . Bun/creat improves. Creatinine 1.31 02/03/14       Hyperlipidemia (Chronic)    03/15/14 LDL 115. Continue Statin.         GERD (gastroesophageal reflux disease)    C/o difficulty swallowing. F/u GI. Continue Nexium 40mg  po daily.         Edema (Chronic)    Trace only in BLE. No s/s of developing CHF. Hold Furosemide 03/16/14 creat 2.17        CVA (cerebral vascular accident)    In setting of hx of cerebral atherosclerosis-left facial and limb weakness.  03/15/14 US carotid: minimal plaque. No significant carotid artery stenosis.  03/15/14 EKG SR 03/16/14 CT head w/o contrast:  IMPRESSION: No acute intracranial abnormality. Multiple lacunar infarcts, some of which are new since 10/20/2012 but not felt to be acute.  Stable chronic atrophy.  Will discuss ASA vs Xarelto if creatinine returns to her baseline. Creat 2.17 03/15/14-hold Furosemide and Kcl-repeat BMP.         Chronic diastolic  congestive heart failure (Chronic)    Clinically compensated. 03/16/14 hold Furosemide 20mg  and Kcl 37meq daily. Bun/creat 15/1.31 02/03/14 and 27/2.17 03/15/14        Anxiety state    Stable, cont Mirtazapine 7.5mg  nightly        Acute on chronic renal insufficiency    Baseline creat 1.2-1.3. 03/15/14 creat 2.17. Hold Furosemide. Repeat BMP          Review of Systems:  Review of Systems  Constitutional: Negative for fever, chills, weight loss, malaise/fatigue and diaphoresis.  HENT: Positive for hearing loss. Negative for congestion, ear discharge, ear pain, nosebleeds, sore throat and tinnitus.   Eyes: Negative for blurred vision, double vision, photophobia, pain, discharge and redness.  Respiratory: Negative for cough, hemoptysis, sputum production, shortness of breath, wheezing and stridor.   Cardiovascular: Positive for leg swelling and PND. Negative for chest pain, palpitations, orthopnea and claudication.  Gastrointestinal: Negative for heartburn, nausea, vomiting, abdominal pain, diarrhea, constipation, blood in stool and melena.  Genitourinary: Positive for frequency. Negative for dysuria, urgency, hematuria and flank pain.  Musculoskeletal: Positive for back pain. Negative for myalgias, joint pain, falls and neck pain.  Skin: Negative for itching and rash.  Neurological: Positive for speech change and focal weakness. Negative for dizziness, tingling, tremors, sensory change, seizures, loss of consciousness, weakness and headaches.  Endo/Heme/Allergies: Negative for environmental allergies and polydipsia. Bruises/bleeds easily.  Psychiatric/Behavioral: Positive for memory loss. Negative for depression, suicidal ideas, hallucinations  and substance abuse. The patient is nervous/anxious. The patient does not have insomnia.      Past Medical History  Diagnosis Date  . Senile osteoporosis   . Heart murmur   . Small bowel obstruction   . GERD (gastroesophageal reflux disease)    . Hypertension   . Glaucoma   . Arthritis     Osteoarthritis  . Personal history of fall   . Edema   . Hyperlipidemia   . Diverticulosis of colon (without mention of hemorrhage)   . Other malaise and fatigue   . Hypertonicity of bladder   . Osteoarthrosis, unspecified whether generalized or localized, unspecified site   . Lumbago   . Edema   . Pain in joint, pelvic region and thigh   . Pain in joint, shoulder region   . Flat feet   . Cerebral ischemia 12/19/2012    Microvascular   . Cerebral atrophy 12/19/2012  . Cerebral atherosclerosis 12/19/2012  . RLQ abdominal pain 01/17/2011   Past Surgical History  Procedure Laterality Date  . Tonsillectomy    . Colon surgery  02/1996    Colectomy, partial for diverticular bleed 90%  . Joint replacement  12/2000    Left knee; Dr. Wynelle Link  . Breast lumpectomy Bilateral   . Laparoscopic lysis of adhesions  11/15/10    Exploratory   Social History:   reports that she quit smoking about 33 years ago. Her smoking use included Cigarettes. She has a 15 pack-year smoking history. She has never used smokeless tobacco. She reports that she drinks about 0.6 oz of alcohol per week. She reports that she does not use illicit drugs.  Family History  Problem Relation Age of Onset  . Cancer Father 61    colon    Medications: Patient's Medications  New Prescriptions   No medications on file  Previous Medications   ASPIRIN 81 MG TABLET    Take 81 mg by mouth daily.     ATORVASTATIN (LIPITOR) 20 MG TABLET    Take 20 mg by mouth daily.   BETIMOL 0.5 % OPHTHALMIC SOLUTION    Place 1 drop into both eyes every morning.    CALCIUM CARBONATE (OS-CAL) 600 MG TABS    Take 600 mg by mouth daily.   CARVEDILOL (COREG) 3.125 MG TABLET    TAKE 1 TABLET BY MOUTH TWICE A DAY WITH A MEAL   CHOLECALCIFEROL (VITAMIN D3) 2000 UNITS TABS    Take 2,000 Units by mouth daily.     CYANOCOBALAMIN (B-12) 2000 MCG TABS    Take 2,000 mcg by mouth 3 (three) times a week.  Monday, Wednesday, AND Friday   ESOMEPRAZOLE (NEXIUM) 40 MG CAPSULE    Take one capsule once daily for stomach   FUROSEMIDE (LASIX) 20 MG TABLET    TAKE 1 TABLET BY MOUTH ONCE DAILY   KLOR-CON M10 10 MEQ TABLET    TAKE 1 TABLET BY MOUTH EVERY DAY   LATANOPROST (XALATAN) 0.005 % OPHTHALMIC SOLUTION    Place 1 drop into both eyes at bedtime.    LISINOPRIL (PRINIVIL,ZESTRIL) 5 MG TABLET    Take one tablet by mouth once daily for hypertension   LYRICA 50 MG CAPSULE    TAKE ONE CAPSULE BY MOUTH TWICE A DAY   MIRTAZAPINE (REMERON) 7.5 MG TABLET    TAKE 1 TABLET (7.5 MG TOTAL) BY MOUTH AT BEDTIME. NIGHTLY FOR APPETITE   MULTIPLE VITAMIN (MULTIVITAMIN) CAPSULE    Take 1 capsule by mouth daily.  OMEGA-3 FATTY ACIDS (FISH OIL) 1200 MG CAPS    Take 1,200 mg by mouth 2 (two) times daily.     PROBIOTIC PRODUCT (RESTORA PO)    Take by mouth. Take one daily   TRAMADOL (ULTRAM) 50 MG TABLET    Take 50-100 mg by mouth 2 (two) times daily as needed for pain.  Modified Medications   No medications on file  Discontinued Medications   No medications on file     Physical Exam: Physical Exam  Constitutional: She is oriented to person, place, and time. She appears well-developed and well-nourished. No distress.  HENT:  Head: Normocephalic and atraumatic.  Right Ear: External ear normal.  Left Ear: External ear normal.  Nose: Nose normal.  Mouth/Throat: Oropharynx is clear and moist. No oropharyngeal exudate.  Eyes: Conjunctivae and EOM are normal. Right eye exhibits no discharge. Left eye exhibits no discharge. No scleral icterus. Right pupil is round and reactive. Left pupil is round. Pupils are equal.  Left pupil slow reaction.   Neck: Normal range of motion. Neck supple. No JVD present. No thyromegaly present.  Cardiovascular: Normal rate and regular rhythm.   Murmur heard.  Systolic murmur is present with a grade of 2/6  Pulmonary/Chest: Effort normal and breath sounds normal. No respiratory  distress. She has no wheezes. She has no rales. She exhibits no tenderness.  Abdominal: Soft. Bowel sounds are normal. She exhibits no distension. There is no tenderness. There is no rebound.  Musculoskeletal: Normal range of motion. She exhibits edema (trace) and tenderness.  S/p L TKR. C/o R knee pain.  Lymphadenopathy:    She has no cervical adenopathy.  Neurological: She is alert and oriented to person, place, and time. She has normal reflexes. No cranial nerve deficit. She exhibits abnormal muscle tone. Coordination normal.  Skin: Skin is warm and dry. No rash noted. She is not diaphoretic. No erythema.  abd surgical scar 1998 subtotal colectomy. S/p TKR-left  Psychiatric: She has a normal mood and affect. Her behavior is normal. Judgment and thought content normal.    Filed Vitals:   03/16/14 1213  BP: 146/72  Pulse: 80  Temp: 98.4 F (36.9 C)  TempSrc: Tympanic  Resp: 18      Labs reviewed: Basic Metabolic Panel:  Recent Labs  02/03/14 03/01/14 03/15/14  NA 141 147 140  K 3.8 4.3 4.4  BUN 15 29* 27*  CREATININE 1.3* 1.2* 2.2*  TSH 2.29  --   --    Liver Function Tests:  Recent Labs  09/28/13 02/03/14 03/15/14  AST 17 18 18   ALT 10 8 8   ALKPHOS 47 49 48   No results for input(s): LIPASE, AMYLASE in the last 8760 hours. No results for input(s): AMMONIA in the last 8760 hours. CBC:  Recent Labs  02/03/14 03/15/14  WBC 5.5 6.6  HGB 12.3 12.6  HCT 36 38  PLT 255 234   Lipid Panel:  Recent Labs  09/28/13 03/15/14  CHOL 191 190  HDL 62 54  LDLCALC 108 21  TRIG 104 104   Anemia Panel: No results for input(s): FOLATE, IRON, VITAMINB12 in the last 8760 hours.  Past Procedures:  02/2013 mammogram negative  11/2013 RLE negative for DVT  03/15/14 EKG SR  03/15/14 US carotid: no significant stenosis.   03/16/14 CT head w/o contrast  IMPRESSION: No acute intracranial abnormality. Multiple lacunar infarcts, some of which are new since 10/20/2012 but  not felt to be acute.  Stable chronic atrophy.  Assessment/Plan Hypertension Controlled, takes Carvedilol 3.125mg , Furosemide(held 03/16/14 creatinine 2.17), Lisinopril 5mg . Bun/creat improves. Creatinine 1.31 02/03/14    Edema Trace only in BLE. No s/s of developing CHF. Hold Furosemide 03/16/14 creat 2.17     Hyperlipidemia 03/15/14 LDL 115. Continue Statin.      Chronic diastolic congestive heart failure Clinically compensated. 03/16/14 hold Furosemide 20mg  and Kcl 70meq daily. Bun/creat 15/1.31 02/03/14 and 27/2.17 03/15/14     GERD (gastroesophageal reflux disease) C/o difficulty swallowing. F/u GI. Continue Nexium 40mg  po daily.      Anxiety state Stable, cont Mirtazapine 7.5mg  nightly     CVA (cerebral vascular accident) In setting of hx of cerebral atherosclerosis-left facial and limb weakness.  03/15/14 US carotid: minimal plaque. No significant carotid artery stenosis.  03/15/14 EKG SR 03/16/14 CT head w/o contrast:  IMPRESSION: No acute intracranial abnormality. Multiple lacunar infarcts, some of which are new since 10/20/2012 but not felt to be acute.  Stable chronic atrophy.  Will discuss ASA vs Xarelto if creatinine returns to her baseline. Creat 2.17 03/15/14-hold Furosemide and Kcl-repeat BMP.      Acute on chronic renal insufficiency Baseline creat 1.2-1.3. 03/15/14 creat 2.17. Hold Furosemide. Repeat BMP      Family/ Staff Communication:  Rehab   Goals of Care: AL  Labs/tests ordered: none

## 2014-03-16 NOTE — Assessment & Plan Note (Signed)
C/o difficulty swallowing. F/u GI. Continue Nexium 40mg  po daily.

## 2014-03-16 NOTE — Assessment & Plan Note (Signed)
Controlled, takes Carvedilol 3.125mg , Furosemide(held 03/16/14 creatinine 2.17), Lisinopril 5mg . Bun/creat improves. Creatinine 1.31 02/03/14

## 2014-03-16 NOTE — Assessment & Plan Note (Signed)
Stable, cont Mirtazapine 7.5mg nightly  

## 2014-03-17 ENCOUNTER — Encounter: Payer: Medicare Other | Admitting: Nurse Practitioner

## 2014-03-21 ENCOUNTER — Other Ambulatory Visit: Payer: Self-pay | Admitting: Nurse Practitioner

## 2014-03-21 ENCOUNTER — Encounter: Payer: Self-pay | Admitting: Nurse Practitioner

## 2014-03-21 ENCOUNTER — Non-Acute Institutional Stay (SKILLED_NURSING_FACILITY): Payer: Medicare Other | Admitting: Nurse Practitioner

## 2014-03-21 DIAGNOSIS — M25511 Pain in right shoulder: Secondary | ICD-10-CM | POA: Insufficient documentation

## 2014-03-21 DIAGNOSIS — M5440 Lumbago with sciatica, unspecified side: Secondary | ICD-10-CM

## 2014-03-21 DIAGNOSIS — R609 Edema, unspecified: Secondary | ICD-10-CM

## 2014-03-21 DIAGNOSIS — F411 Generalized anxiety disorder: Secondary | ICD-10-CM

## 2014-03-21 DIAGNOSIS — I10 Essential (primary) hypertension: Secondary | ICD-10-CM

## 2014-03-21 DIAGNOSIS — N189 Chronic kidney disease, unspecified: Secondary | ICD-10-CM

## 2014-03-21 DIAGNOSIS — K1379 Other lesions of oral mucosa: Secondary | ICD-10-CM

## 2014-03-21 DIAGNOSIS — I639 Cerebral infarction, unspecified: Secondary | ICD-10-CM

## 2014-03-21 DIAGNOSIS — K219 Gastro-esophageal reflux disease without esophagitis: Secondary | ICD-10-CM

## 2014-03-21 DIAGNOSIS — N289 Disorder of kidney and ureter, unspecified: Principal | ICD-10-CM

## 2014-03-21 DIAGNOSIS — I5032 Chronic diastolic (congestive) heart failure: Secondary | ICD-10-CM

## 2014-03-21 DIAGNOSIS — N179 Acute kidney failure, unspecified: Secondary | ICD-10-CM

## 2014-03-21 LAB — BASIC METABOLIC PANEL
BUN: 31 mg/dL — AB (ref 4–21)
Creatinine: 2.3 mg/dL — AB (ref 0.5–1.1)
Glucose: 93 mg/dL
POTASSIUM: 3.9 mmol/L (ref 3.4–5.3)
Sodium: 137 mmol/L (ref 137–147)

## 2014-03-21 NOTE — Assessment & Plan Note (Signed)
Clinically compensated. 03/16/14 hold Furosemide 20mg  and Kcl 69meq daily. Bun/creat 15/1.31 02/03/14 and 27/2.17 03/15/14

## 2014-03-21 NOTE — Assessment & Plan Note (Signed)
03/18/14 Xray R shoulder: potential glenohumeral joint effusion or hemarthrosis with respect to potentital ssociated acute osseous fracture injury. Glenohumeral degenerative arthrosis changes are notably advanced.  03/20/08 fell.

## 2014-03-21 NOTE — Assessment & Plan Note (Signed)
Inner left lower lip-small ulcer-Magic Mouth Wash 57ml s/s ac and hs x 2weeks.

## 2014-03-21 NOTE — Assessment & Plan Note (Signed)
Trace only in BLE. No s/s of developing CHF. Hold Furosemide 03/16/14 creat 2.17 03/21/14 creat 2.32, still trace edema BLE, continue to hold Furosemide. Repeat BMP

## 2014-03-21 NOTE — Assessment & Plan Note (Addendum)
Baseline creat 1.2-1.3. 03/15/14 creat 2.17. Hold Furosemide.  03/21/14 creat 2.32-continue to hold Furosemide, will hold Atorvastatin and Lisinopril as well. Repeat BMP and obtain renal US

## 2014-03-21 NOTE — Assessment & Plan Note (Signed)
C/o difficulty swallowing. F/u GI. Continue Nexium 40mg  po daily.

## 2014-03-21 NOTE — Assessment & Plan Note (Signed)
Stable, cont Mirtazapine 7.5mg nightly  

## 2014-03-21 NOTE — Assessment & Plan Note (Signed)
Controlled, takes Carvedilol 3.125mg , Furosemide(held 03/16/14 creatinine 2.17), Lisinopril 5mg . Bun/creat improves. Creatinine 1.31 02/03/14 03/21/14 creat 2.32-hold Furosemide, Lisinopril, and Atorvastatin, update BMP

## 2014-03-21 NOTE — Assessment & Plan Note (Signed)
In setting of hx of cerebral atherosclerosis-left facial and limb weakness.  03/15/14 US carotid: minimal plaque. No significant carotid artery stenosis.  03/15/14 EKG SR 03/16/14 CT head w/o contrast:  IMPRESSION: No acute intracranial abnormality. Multiple lacunar infarcts, some of which are new since 10/20/2012 but not felt to be acute.  Stable chronic atrophy.  Will discuss ASA vs Xarelto if creatinine returns to her baseline. Creat 2.17 03/15/14-hold Furosemide and Kcl-repeat BMP.  03/21/14 persisted elevated creatinine 2.32 03/21/14-will continue to hold Furosemide, will hold Lisinopril and Atorvastatin-update BMP

## 2014-03-21 NOTE — Assessment & Plan Note (Signed)
Takes Lyrica 50mg  bid and Tramadol prn for back and BLE neuropathic pain.

## 2014-03-21 NOTE — Progress Notes (Signed)
Patient ID: [name], female   DOB: [birth year], 81 y.o.   MRN: [id number]   Code Status: DNR  Allergies  Allergen Reactions  . Sulfa Antibiotics Itching    Chief Complaint  Patient presents with  . Medical Management of Chronic Issues  . Acute Visit    renal insufficiency.     HPI: Patient is a 81 y.o. female seen in the SNF at [facility] today for evaluation of  Acute renal insufficiency and other chronic medical conditions.  Problem List Items Addressed This Visit    Right shoulder pain    03/18/14 Xray R shoulder: potential glenohumeral joint effusion or hemarthrosis with respect to potentital ssociated acute osseous fracture injury. Glenohumeral degenerative arthrosis changes are notably advanced.  03/20/08 fell.        Mouth sore    Inner left lower lip-small ulcer-Magic Mouth Wash 63ml s/s ac and hs x 2weeks.       Lumbago    Takes Lyrica 50mg  bid and Tramadol prn for back and BLE neuropathic pain.         Hypertension (Chronic)    Controlled, takes Carvedilol 3.125mg , Furosemide(held 03/16/14 creatinine 2.17), Lisinopril 5mg . Bun/creat improves. Creatinine 1.31 02/03/14 03/21/14 creat 2.32-hold Furosemide, Lisinopril, and Atorvastatin, update BMP        GERD (gastroesophageal reflux disease)    C/o difficulty swallowing. F/u GI. Continue Nexium 40mg  po daily.         Edema (Chronic)    Trace only in BLE. No s/s of developing CHF. Hold Furosemide 03/16/14 creat 2.17 03/21/14 creat 2.32, still trace edema BLE, continue to hold Furosemide. Repeat BMP        CVA (cerebral vascular accident)    In setting of hx of cerebral atherosclerosis-left facial and limb weakness.  03/15/14 US carotid: minimal plaque. No significant carotid artery stenosis.  03/15/14 EKG SR 03/16/14 CT head w/o contrast:  IMPRESSION: No acute intracranial abnormality. Multiple lacunar infarcts, some of which are new since 10/20/2012 but not felt to be acute.  Stable chronic  atrophy.  Will discuss ASA vs Xarelto if creatinine returns to her baseline. Creat 2.17 03/15/14-hold Furosemide and Kcl-repeat BMP.  03/21/14 persisted elevated creatinine 2.32 03/21/14-will continue to hold Furosemide, will hold Lisinopril and Atorvastatin-update BMP         Chronic diastolic congestive heart failure (Chronic)    Clinically compensated. 03/16/14 hold Furosemide 20mg  and Kcl 69meq daily. Bun/creat 15/1.31 02/03/14 and 27/2.17 03/15/14        Anxiety state    Stable, cont Mirtazapine 7.5mg  nightly         Acute on chronic renal insufficiency - Primary    Baseline creat 1.2-1.3. 03/15/14 creat 2.17. Hold Furosemide.  03/21/14 creat 2.32-continue to hold Furosemide, will hold Atorvastatin and Lisinopril as well. Repeat BMP and obtain renal US           Review of Systems:  Review of Systems  Constitutional: Negative for fever, chills, weight loss, malaise/fatigue and diaphoresis.  HENT: Positive for hearing loss. Negative for congestion, ear discharge, ear pain, nosebleeds, sore throat and tinnitus.        Mouth sore  Eyes: Negative for blurred vision, double vision, photophobia, pain, discharge and redness.  Respiratory: Negative for cough, hemoptysis, sputum production, shortness of breath, wheezing and stridor.   Cardiovascular: Positive for leg swelling. Negative for chest pain, palpitations, orthopnea, claudication and PND.       Trace  Gastrointestinal: Negative for heartburn, nausea, vomiting, abdominal  pain, diarrhea, constipation, blood in stool and melena.  Genitourinary: Positive for frequency. Negative for dysuria, urgency, hematuria and flank pain.  Musculoskeletal: Positive for joint pain and falls. Negative for myalgias, back pain and neck pain.       The right shoulder pain  Skin: Negative for itching and rash.  Neurological: Positive for focal weakness. Negative for dizziness, tingling, tremors, sensory change, speech change, seizures, loss of  consciousness and weakness.       Right facial and limb weakness-mild.   Endo/Heme/Allergies: Negative for environmental allergies and polydipsia. Does not bruise/bleed easily.  Psychiatric/Behavioral: Positive for memory loss. Negative for depression, suicidal ideas, hallucinations and substance abuse. The patient is nervous/anxious. The patient does not have insomnia.      Past Medical History  Diagnosis Date  . Senile osteoporosis   . Heart murmur   . Small bowel obstruction   . GERD (gastroesophageal reflux disease)   . Hypertension   . Glaucoma   . Arthritis     Osteoarthritis  . Personal history of fall   . Edema   . Hyperlipidemia   . Diverticulosis of colon (without mention of hemorrhage)   . Other malaise and fatigue   . Hypertonicity of bladder   . Osteoarthrosis, unspecified whether generalized or localized, unspecified site   . Lumbago   . Edema   . Pain in joint, pelvic region and thigh   . Pain in joint, shoulder region   . Flat feet   . Cerebral ischemia 12/19/2012    Microvascular   . Cerebral atrophy 12/19/2012  . Cerebral atherosclerosis 12/19/2012  . RLQ abdominal pain 01/17/2011   Past Surgical History  Procedure Laterality Date  . Tonsillectomy    . Colon surgery  02/1996    Colectomy, partial for diverticular bleed 90%  . Joint replacement  12/2000    Left knee; Dr. Wynelle Link  . Breast lumpectomy Bilateral   . Laparoscopic lysis of adhesions  11/15/10    Exploratory   Social History:   reports that she quit smoking about 33 years ago. Her smoking use included Cigarettes. She has a 15 pack-year smoking history. She has never used smokeless tobacco. She reports that she drinks about 0.6 oz of alcohol per week. She reports that she does not use illicit drugs.  Family History  Problem Relation Age of Onset  . Cancer Father 38    colon    Medications: Patient's Medications  New Prescriptions   No medications on file  Previous Medications    ASPIRIN 81 MG TABLET    Take 81 mg by mouth daily.     ATORVASTATIN (LIPITOR) 20 MG TABLET    Take 20 mg by mouth daily.   BETIMOL 0.5 % OPHTHALMIC SOLUTION    Place 1 drop into both eyes every morning.    CALCIUM CARBONATE (OS-CAL) 600 MG TABS    Take 600 mg by mouth daily.   CARVEDILOL (COREG) 3.125 MG TABLET    TAKE 1 TABLET BY MOUTH TWICE A DAY WITH A MEAL   CHOLECALCIFEROL (VITAMIN D3) 2000 UNITS TABS    Take 2,000 Units by mouth daily.     CYANOCOBALAMIN (B-12) 2000 MCG TABS    Take 2,000 mcg by mouth 3 (three) times a week. Monday, Wednesday, AND Friday   ESOMEPRAZOLE (NEXIUM) 40 MG CAPSULE    Take one capsule once daily for stomach   FUROSEMIDE (LASIX) 20 MG TABLET    TAKE 1 TABLET BY MOUTH ONCE DAILY  KLOR-CON M10 10 MEQ TABLET    TAKE 1 TABLET BY MOUTH EVERY DAY   LATANOPROST (XALATAN) 0.005 % OPHTHALMIC SOLUTION    Place 1 drop into both eyes at bedtime.    LISINOPRIL (PRINIVIL,ZESTRIL) 5 MG TABLET    Take one tablet by mouth once daily for hypertension   LYRICA 50 MG CAPSULE    TAKE ONE CAPSULE BY MOUTH TWICE A DAY   MIRTAZAPINE (REMERON) 7.5 MG TABLET    TAKE 1 TABLET (7.5 MG TOTAL) BY MOUTH AT BEDTIME. NIGHTLY FOR APPETITE   MULTIPLE VITAMIN (MULTIVITAMIN) CAPSULE    Take 1 capsule by mouth daily.     OMEGA-3 FATTY ACIDS (FISH OIL) 1200 MG CAPS    Take 1,200 mg by mouth 2 (two) times daily.     PROBIOTIC PRODUCT (RESTORA PO)    Take by mouth. Take one daily   TRAMADOL (ULTRAM) 50 MG TABLET    Take 50-100 mg by mouth 2 (two) times daily as needed for pain.  Modified Medications   No medications on file  Discontinued Medications   No medications on file     Physical Exam: Physical Exam  Constitutional: She is oriented to person, place, and time. She appears well-developed and well-nourished. No distress.  HENT:  Head: Normocephalic and atraumatic.  Right Ear: External ear normal.  Left Ear: External ear normal.  Nose: Nose normal.  Mouth/Throat: Oropharynx is clear and  moist. No oropharyngeal exudate.  Left lower inner lip small ulcer.   Eyes: Conjunctivae and EOM are normal. Right eye exhibits no discharge. Left eye exhibits no discharge. No scleral icterus. Right pupil is round and reactive. Left pupil is round. Pupils are equal.  Left pupil slow reaction.   Neck: Normal range of motion. Neck supple. No JVD present. No thyromegaly present.  Cardiovascular: Normal rate and regular rhythm.   Murmur heard.  Systolic murmur is present with a grade of 2/6  Pulmonary/Chest: Effort normal and breath sounds normal. No respiratory distress. She has no wheezes. She has no rales. She exhibits no tenderness.  Abdominal: Soft. Bowel sounds are normal. She exhibits no distension. There is no tenderness. There is no rebound.  Musculoskeletal: Normal range of motion. She exhibits edema (trace) and tenderness.  S/p L TKR. C/o R knee pain.  Lymphadenopathy:    She has no cervical adenopathy.  Neurological: She is alert and oriented to person, place, and time. She has normal reflexes. No cranial nerve deficit. She exhibits abnormal muscle tone. Coordination normal.  Skin: Skin is warm and dry. No rash noted. She is not diaphoretic. No erythema.  abd surgical scar 1998 subtotal colectomy. S/p TKR-left  Psychiatric: She has a normal mood and affect. Her behavior is normal. Judgment and thought content normal.    Filed Vitals:   03/21/14 1640  BP: 120/52  Pulse: 84  Temp: 98.9 F (37.2 C)  TempSrc: Tympanic  Resp: 20      Labs reviewed: Basic Metabolic Panel:  Recent Labs  02/03/14 03/01/14 03/15/14 03/21/14  NA 141 147 140 137  K 3.8 4.3 4.4 3.9  BUN 15 29* 27* 31*  CREATININE 1.3* 1.2* 2.2* 2.3*  TSH 2.29  --   --   --    Liver Function Tests:  Recent Labs  09/28/13 02/03/14 03/15/14  AST 17 18 18   ALT 10 8 8   ALKPHOS 47 49 48   No results for input(s): LIPASE, AMYLASE in the last 8760 hours. No results for input(s): AMMONIA  in the last 8760  hours. CBC:  Recent Labs  02/03/14 03/15/14  WBC 5.5 6.6  HGB 12.3 12.6  HCT 36 38  PLT 255 234   Lipid Panel:  Recent Labs  09/28/13 03/15/14  CHOL 191 190  HDL 62 54  LDLCALC 108 21  TRIG 104 104    Past Procedures:  03/16/14 CT head w/o contrast:  IMPRESSION: No acute intracranial abnormality. Multiple lacunar infarcts, some of which are new since 10/20/2012 but not felt to be acute.  Stable chronic atrophy.   Assessment/Plan Acute on chronic renal insufficiency Baseline creat 1.2-1.3. 03/15/14 creat 2.17. Hold Furosemide.  03/21/14 creat 2.32-continue to hold Furosemide, will hold Atorvastatin and Lisinopril as well. Repeat BMP and obtain renal US     Right shoulder pain 03/18/14 Xray R shoulder: potential glenohumeral joint effusion or hemarthrosis with respect to potentital ssociated acute osseous fracture injury. Glenohumeral degenerative arthrosis changes are notably advanced.  03/20/08 fell.     Mouth sore Inner left lower lip-small ulcer-Magic Mouth Wash 71ml s/s ac and hs x 2weeks.    CVA (cerebral vascular accident) In setting of hx of cerebral atherosclerosis-left facial and limb weakness.  03/15/14 US carotid: minimal plaque. No significant carotid artery stenosis.  03/15/14 EKG SR 03/16/14 CT head w/o contrast:  IMPRESSION: No acute intracranial abnormality. Multiple lacunar infarcts, some of which are new since 10/20/2012 but not felt to be acute.  Stable chronic atrophy.  Will discuss ASA vs Xarelto if creatinine returns to her baseline. Creat 2.17 03/15/14-hold Furosemide and Kcl-repeat BMP.  03/21/14 persisted elevated creatinine 2.32 03/21/14-will continue to hold Furosemide, will hold Lisinopril and Atorvastatin-update BMP      Anxiety state Stable, cont Mirtazapine 7.5mg  nightly      GERD (gastroesophageal reflux disease) C/o difficulty swallowing. F/u GI. Continue Nexium 40mg  po daily.      Lumbago Takes Lyrica 50mg  bid and  Tramadol prn for back and BLE neuropathic pain.      Chronic diastolic congestive heart failure Clinically compensated. 03/16/14 hold Furosemide 20mg  and Kcl 89meq daily. Bun/creat 15/1.31 02/03/14 and 27/2.17 03/15/14     Edema Trace only in BLE. No s/s of developing CHF. Hold Furosemide 03/16/14 creat 2.17 03/21/14 creat 2.32, still trace edema BLE, continue to hold Furosemide. Repeat BMP     Hypertension Controlled, takes Carvedilol 3.125mg , Furosemide(held 03/16/14 creatinine 2.17), Lisinopril 5mg . Bun/creat improves. Creatinine 1.31 02/03/14 03/21/14 creat 2.32-hold Furosemide, Lisinopril, and Atorvastatin, update BMP       Family/ Staff Communication: observe the patient  Goals of Care: SNF  Labs/tests ordered: BMP. Renal US

## 2014-03-22 ENCOUNTER — Encounter: Payer: Self-pay | Admitting: Internal Medicine

## 2014-03-22 ENCOUNTER — Other Ambulatory Visit: Payer: Self-pay | Admitting: Internal Medicine

## 2014-03-29 LAB — BASIC METABOLIC PANEL
BUN: 22 mg/dL — AB (ref 4–21)
Creatinine: 1.5 mg/dL — AB (ref 0.5–1.1)
Glucose: 84 mg/dL
POTASSIUM: 4.4 mmol/L (ref 3.4–5.3)
SODIUM: 142 mmol/L (ref 137–147)

## 2014-03-31 ENCOUNTER — Other Ambulatory Visit: Payer: Self-pay | Admitting: Nurse Practitioner

## 2014-03-31 DIAGNOSIS — I5032 Chronic diastolic (congestive) heart failure: Secondary | ICD-10-CM

## 2014-03-31 DIAGNOSIS — N289 Disorder of kidney and ureter, unspecified: Principal | ICD-10-CM

## 2014-03-31 DIAGNOSIS — N189 Chronic kidney disease, unspecified: Secondary | ICD-10-CM

## 2014-04-05 LAB — BASIC METABOLIC PANEL
BUN: 29 mg/dL — AB (ref 4–21)
Creatinine: 1.8 mg/dL — AB (ref 0.5–1.1)
GLUCOSE: 88 mg/dL
Potassium: 3.9 mmol/L (ref 3.4–5.3)
Sodium: 142 mmol/L (ref 137–147)

## 2014-04-06 ENCOUNTER — Other Ambulatory Visit: Payer: Self-pay | Admitting: Nurse Practitioner

## 2014-04-06 DIAGNOSIS — N289 Disorder of kidney and ureter, unspecified: Principal | ICD-10-CM

## 2014-04-06 DIAGNOSIS — N189 Chronic kidney disease, unspecified: Secondary | ICD-10-CM

## 2014-04-07 ENCOUNTER — Encounter: Payer: Self-pay | Admitting: Internal Medicine

## 2014-04-07 ENCOUNTER — Non-Acute Institutional Stay: Payer: Medicare Other | Admitting: Internal Medicine

## 2014-04-07 VITALS — BP 140/68 | HR 76 | Temp 97.7°F | Wt 124.0 lb

## 2014-04-07 DIAGNOSIS — F411 Generalized anxiety disorder: Secondary | ICD-10-CM | POA: Diagnosis not present

## 2014-04-07 DIAGNOSIS — I639 Cerebral infarction, unspecified: Secondary | ICD-10-CM

## 2014-04-07 DIAGNOSIS — M25511 Pain in right shoulder: Secondary | ICD-10-CM | POA: Diagnosis not present

## 2014-04-07 DIAGNOSIS — I6782 Cerebral ischemia: Secondary | ICD-10-CM

## 2014-04-07 DIAGNOSIS — I10 Essential (primary) hypertension: Secondary | ICD-10-CM

## 2014-04-07 NOTE — Progress Notes (Signed)
Patient ID: [name], female   DOB: [birth year], 81 y.o.   MRN: [id number]    [place]     Place of Service: Clinic (12)    Allergies  Allergen Reactions  . Sulfa Antibiotics Itching    Chief Complaint  Patient presents with  . Medical Management of Chronic Issues    weakness, confusion, started today     HPI:  Patient was in the [facility] building from 03/16/14 through 04/05/14 for a probable CVA. She had weakness and confusion at that point. She was felt improved enough to return her home 04/05/14.  Patient lives in a cottage outside the main buildings at [facility]. Nursing was called at about 9 AM. The patient reported her balance is off. The nurse saw the patient and her home. Patient complains of right arm muscle soreness and weakness. She was observed using the right hand to cut meat pancakes. She was observed walking. She seemed to do better with a walker than a cane. She was left in her apartment at that point. Her daughter was called by the staff nurse Tiffany. Her daughter wants to move her to a smaller apartment in the main building. Patient is on file for this. Later in the morning at about 11:40 AM, the patient called again and said that she didn't feel right. Vital signs were normal at that point with blood pressure 118/62, pulse 82, respirations 16, and oxygen saturation 87%. Patient related a story of feeling weaker overall without focality, and recalled her speech didn't seem to be right. There was some problems with unclear thinking at a point she was reading some cards. She felt confused. It was recommended at that point that she might consider going to the emergency room but she refused to do this. Point was made in clinic and she was seen about 4:30 PM.  Patient repeats the story as related above. Memory seems to correlate well with the written version given to [place] by the clinic nurse, Tiffany. She is feeling better as compared to how she felt  the morning. She again refuses to go to the emergency room. She also does not want to remain in the [facility] observation area. She expresses a strong desire to return to her home.  Medications: Patient's Medications  New Prescriptions   No medications on file  Previous Medications   ASPIRIN 81 MG TABLET    Take 81 mg by mouth daily.     ATORVASTATIN (LIPITOR) 20 MG TABLET    Take 20 mg by mouth daily.   BETIMOL 0.5 % OPHTHALMIC SOLUTION    Place 1 drop into both eyes every morning.    CALCIUM CARBONATE (OS-CAL) 600 MG TABS    Take 600 mg by mouth daily.   CARVEDILOL (COREG) 3.125 MG TABLET    TAKE 1 TABLET BY MOUTH TWICE A DAY WITH A MEAL   CHOLECALCIFEROL (VITAMIN D3) 2000 UNITS TABS    Take 2,000 Units by mouth daily.     CYANOCOBALAMIN (B-12) 2000 MCG TABS    Take 2,000 mcg by mouth 3 (three) times a week. Monday, Wednesday, AND Friday   FUROSEMIDE (LASIX) 20 MG TABLET    TAKE 1 TABLET BY MOUTH ONCE DAILY   KLOR-CON M10 10 MEQ TABLET    TAKE 1 TABLET BY MOUTH EVERY DAY   LATANOPROST (XALATAN) 0.005 % OPHTHALMIC SOLUTION    Place 1 drop into both eyes at bedtime.    LYRICA 50 MG CAPSULE  TAKE ONE CAPSULE BY MOUTH TWICE A DAY   MIRTAZAPINE (REMERON) 7.5 MG TABLET    TAKE 1 TABLET (7.5 MG TOTAL) BY MOUTH AT BEDTIME. NIGHTLY FOR APPETITE   MULTIPLE VITAMIN (MULTIVITAMIN) CAPSULE    Take 1 capsule by mouth daily.     OMEGA-3 FATTY ACIDS (FISH OIL) 1200 MG CAPS    Take 1,200 mg by mouth 2 (two) times daily.     OMEPRAZOLE (PRILOSEC) 20 MG CAPSULE    Take 20 mg by mouth daily.   SPIRONOLACTONE (ALDACTONE) 25 MG TABLET    Take 25 mg by mouth daily.  Modified Medications   No medications on file  Discontinued Medications   ESOMEPRAZOLE (NEXIUM) 40 MG CAPSULE    Take one capsule once daily for stomach   LISINOPRIL (PRINIVIL,ZESTRIL) 5 MG TABLET    Take one tablet by mouth once daily for hypertension   MIRTAZAPINE (REMERON) 7.5 MG TABLET    TAKE 1 TABLET (7.5 MG TOTAL) BY MOUTH AT BEDTIME. NIGHTLY  FOR APPETITE   PROBIOTIC PRODUCT (RESTORA PO)    Take by mouth. Take one daily   TRAMADOL (ULTRAM) 50 MG TABLET    Take 50-100 mg by mouth 2 (two) times daily as needed for pain.     Review of Systems  Constitutional:       Complains of generalized weakness  HENT: Positive for hearing loss. Negative for ear pain.   Eyes: Negative.   Respiratory: Negative.   Cardiovascular: Positive for leg swelling. Negative for chest pain and palpitations.  Gastrointestinal:       Complains of chronic mild right lower quadrant abdominal pain. History of diverticulosis.  Endocrine: Negative.   Genitourinary: Negative.   Musculoskeletal: Positive for back pain and arthralgias. Negative for myalgias, neck pain and neck stiffness.       Flat feet with dropped arches. Pin in the right medial malleolar bones. Chronic back pain. Chronic right shoulder pain.  Skin: Negative.   Neurological: Positive for weakness (Generalized).       Complaints of confusion earlier today  Hematological:       History of anemia.  Psychiatric/Behavioral: The patient is nervous/anxious.     Filed Vitals:   04/07/14 1620  BP: 140/68  Pulse: 76  Temp: 97.7 F (36.5 C)  TempSrc: Oral  Weight: 124 lb (56.246 kg)  SpO2: 99%   Body mass index is 19.42 kg/(m^2).  Physical Exam   Labs reviewed: Lab on 04/06/2014  Component Date Value Ref Range Status  . Glucose 04/05/2014 88   Final  . BUN 04/05/2014 29* 4 - 21 mg/dL Final  . Creatinine 04/05/2014 1.8* 0.5 - 1.1 mg/dL Final  . Potassium 04/05/2014 3.9  3.4 - 5.3 mmol/L Final  . Sodium 04/05/2014 142  137 - 147 mmol/L Final  Lab on 03/31/2014  Component Date Value Ref Range Status  . Glucose 03/29/2014 84   Final  . BUN 03/29/2014 22* 4 - 21 mg/dL Final  . Creatinine 03/29/2014 1.5* 0.5 - 1.1 mg/dL Final  . Potassium 03/29/2014 4.4  3.4 - 5.3 mmol/L Final  . Sodium 03/29/2014 142  137 - 147 mmol/L Final  Lab on 03/21/2014  Component Date Value Ref Range Status    . Glucose 03/21/2014 93   Final  . BUN 03/21/2014 31* 4 - 21 mg/dL Final  . Creatinine 03/21/2014 2.3* 0.5 - 1.1 mg/dL Final  . Potassium 03/21/2014 3.9  3.4 - 5.3 mmol/L Final  . Sodium 03/21/2014 137  137 -  147 mmol/L Final  Lab on 03/16/2014  Component Date Value Ref Range Status  . Hemoglobin 03/15/2014 12.6  12.0 - 16.0 g/dL Final  . HCT 03/15/2014 38  36 - 46 % Final  . Platelets 03/15/2014 234  150 - 399 K/L Final  . WBC 03/15/2014 6.6   Final  . Glucose 03/15/2014 82   Final  . BUN 03/15/2014 27* 4 - 21 mg/dL Final  . Creatinine 03/15/2014 2.2* 0.5 - 1.1 mg/dL Final  . Potassium 03/15/2014 4.4  3.4 - 5.3 mmol/L Final  . Sodium 03/15/2014 140  137 - 147 mmol/L Final  . Triglycerides 03/15/2014 104  40 - 160 mg/dL Final  . Cholesterol 03/15/2014 190  0 - 200 mg/dL Final  . HDL 03/15/2014 54  35 - 70 mg/dL Final  . LDL Cholesterol 03/15/2014 21   Final  . Alkaline Phosphatase 03/15/2014 48  25 - 125 U/L Final  . ALT 03/15/2014 8  7 - 35 U/L Final  . AST 03/15/2014 18  13 - 35 U/L Final  . Bilirubin, Total 03/15/2014 0.7   Final  Abstract on 03/02/2014  Component Date Value Ref Range Status  . BUN 03/01/2014 29* 4 - 21 mg/dL Final  . Creatinine 03/01/2014 1.2* 0.5 - 1.1 mg/dL Final  . Potassium 03/01/2014 4.3  3.4 - 5.3 mmol/L Final  . Sodium 03/01/2014 147  137 - 147 mmol/L Final  Nursing Home on 02/03/2014  Component Date Value Ref Range Status  . Hemoglobin 02/03/2014 12.3  12.0 - 16.0 g/dL Final  . HCT 02/03/2014 36  36 - 46 % Final  . Platelets 02/03/2014 255  150 - 399 K/L Final  . WBC 02/03/2014 5.5   Final  . Glucose 02/03/2014 89   Final  . BUN 02/03/2014 15  4 - 21 mg/dL Final  . Creatinine 02/03/2014 1.3* 0.5 - 1.1 mg/dL Final  . Potassium 02/03/2014 3.8  3.4 - 5.3 mmol/L Final  . Sodium 02/03/2014 141  137 - 147 mmol/L Final  . Alkaline Phosphatase 02/03/2014 49  25 - 125 U/L Final  . ALT 02/03/2014 8  7 - 35 U/L Final  . AST 02/03/2014 18  13 - 35 U/L  Final  . Bilirubin, Total 02/03/2014 0.8   Final  . TSH 02/03/2014 2.29  0.41 - 5.90 uIU/mL Final     Assessment/Plan 1. Cerebral ischemia Patient has known cerebral ischemia and cerebral atrophy. She is a probable stroke 03/16/14. It is possible that the events earlier today were related to her previous stroke and continuing cerebral ischemia. She may have had a TIA, but this is not a sure diagnosis. She seems recovered enough from the events of this morning that I think it is safe to let her return home, as she desires.  2. Anxiety state Unchanged  3. Essential hypertension Controlled  4. CVA (cerebral vascular accident) Probable recent event of 03/16/14. She was maintained in our facility and not hospitalized for this event.  5. Right shoulder pain Improved

## 2014-04-14 ENCOUNTER — Encounter: Payer: Medicare Other | Admitting: Nurse Practitioner

## 2014-04-14 DIAGNOSIS — N289 Disorder of kidney and ureter, unspecified: Secondary | ICD-10-CM

## 2014-04-14 DIAGNOSIS — K219 Gastro-esophageal reflux disease without esophagitis: Secondary | ICD-10-CM

## 2014-04-14 DIAGNOSIS — I639 Cerebral infarction, unspecified: Secondary | ICD-10-CM

## 2014-04-14 DIAGNOSIS — I10 Essential (primary) hypertension: Secondary | ICD-10-CM

## 2014-04-14 DIAGNOSIS — R634 Abnormal weight loss: Secondary | ICD-10-CM

## 2014-04-14 DIAGNOSIS — N189 Chronic kidney disease, unspecified: Secondary | ICD-10-CM

## 2014-04-14 DIAGNOSIS — M5440 Lumbago with sciatica, unspecified side: Secondary | ICD-10-CM

## 2014-04-14 DIAGNOSIS — I5032 Chronic diastolic (congestive) heart failure: Secondary | ICD-10-CM

## 2014-04-14 DIAGNOSIS — M25511 Pain in right shoulder: Secondary | ICD-10-CM

## 2014-04-14 DIAGNOSIS — F411 Generalized anxiety disorder: Secondary | ICD-10-CM

## 2014-04-14 DIAGNOSIS — R609 Edema, unspecified: Secondary | ICD-10-CM

## 2014-04-14 DIAGNOSIS — K1379 Other lesions of oral mucosa: Secondary | ICD-10-CM

## 2014-04-14 NOTE — Assessment & Plan Note (Addendum)
Controlled, takes Carvedilol 3.125mg , Furosemide(held 03/16/14 creatinine 2.17), Lisinopril 5mg . Bun/creat improves. Creatinine 1.31 02/03/14 03/21/14 creat 2.32-hold Furosemide, Lisinopril, and Atorvastatin 03/29/14 Bun/creat 22/1.47-BNP 470-resumed Lasix 20mg  and started Spironolactone 25mg  daily, update BMP one week.

## 2014-04-14 NOTE — Assessment & Plan Note (Signed)
Baseline creat 1.2-1.3. 03/15/14 creat 2.17. Hold Furosemide.  03/21/14 creat 2.32-continue to hold Furosemide, will hold Atorvastatin and Lisinopril as well. Repeat BMP and obtain renal US 03/22/14 renal US: symmetric but diminutive orthotopic kidneys without hydronephrosis, raising question of non spectific chronic or remote bilateral rena insult.  03/29/14 Bun/creat 22/1.47-BNP 470-resumed Lasix 20mg  and started Spironolactone 25mg  daily, update BMP one week.  04/06/14 Bun/creat 29/1.76 continue Furosemide 20mg , Spironolactone 25mg , dc Lisinopril if bp ok. BMP 2 weeks.

## 2014-04-14 NOTE — Assessment & Plan Note (Signed)
Clinically compensated. 03/16/14 hold Furosemide 20mg  and Kcl 76meq daily. Bun/creat 15/1.31 02/03/14 and 27/2.17 03/15/14 03/29/14 Bun/creat 22/1.47-BNP 470-resumed Lasix 20mg  and started Spironolactone 25mg  daily, update BMP one week.

## 2014-04-14 NOTE — Assessment & Plan Note (Signed)
In setting of hx of cerebral atherosclerosis-left facial and limb weakness.  03/15/14 US carotid: minimal plaque. No significant carotid artery stenosis.  03/15/14 EKG SR 03/16/14 CT head w/o contrast:  IMPRESSION: No acute intracranial abnormality. Multiple lacunar infarcts, someIn setting of hx of cerebral atherosclerosis-left facial and limb weakness.  03/15/14 US carotid: minimal plaque. No significant carotid artery stenosis.  03/15/14 EKG SR 03/16/14 CT head w/o contrast:  IMPRESSION: No acute intracranial abnormality. Multiple lacunar infarcts, some of which are new since 10/20/2012 but not felt to be acute.  Stable chronic atrophy.  Will discuss ASA vs Xarelto if creatinine returns to her baseline. Creat 2.17 03/15/14-hold Furosemide and Kcl-repeat BMP.  03/21/14 persisted elevated creatinine 2.32 03/21/14-will continue to hold Furosemide, will hold Lisinopril and Atorvastatin-update BMP    of which are new since 10/20/2012 but not felt to be acute.  Stable chronic atrophy.  Will discuss ASA vs Xarelto if creatinine returns to her baseline. Creat 2.17 03/15/14-hold Furosemide and Kcl-repeat BMP.  04/14/14 Furosemide 20mg  and Spironolactone 25mg  daily.

## 2014-04-14 NOTE — Assessment & Plan Note (Signed)
Stable, cont Mirtazapine 7.5mg nightly  

## 2014-04-14 NOTE — Assessment & Plan Note (Signed)
Stabilized.  

## 2014-04-14 NOTE — Assessment & Plan Note (Signed)
Inner left lower lip-small ulcer-Magic Mouth Wash 57ml s/s ac and hs x 2weeks.  04/14/14 healed.

## 2014-04-14 NOTE — Assessment & Plan Note (Addendum)
Trace only in BLE. No s/s of developing CHF. Hold Furosemide 03/16/14 creat 2.17 03/21/14 creat 2.32, still trace edema BLE, continue to hold Furosemide 03/29/14 Bun/creat 22/1.47-BNP 470-resumed Lasix 20mg  and started Spironolactone 25mg  daily, update BMP one week.

## 2014-04-14 NOTE — Progress Notes (Signed)
Patient ID: [name], female   DOB: February 16, 1926, 81 y.o.   MRN: [id number]   Code Status: DNR  Allergies  Allergen Reactions  . Sulfa Antibiotics Itching    No chief complaint on file.   HPI: Patient is a 81 y.o. female seen in the SNF at [facility] today for evaluation of chronic medical conditions.  Problem List Items Addressed This Visit    Right shoulder pain    03/18/14 Xray R shoulder: potential glenohumeral joint effusion or hemarthrosis with respect to potentital ssociated acute osseous fracture injury. Glenohumeral degenerative arthrosis changes are notably advanced.  03/20/08 fell.         Mouth sore    Inner left lower lip-small ulcer-Magic Mouth Wash 69ml s/s ac and hs x 2weeks.  04/14/14 healed.        Lumbago    Takes Lyrica 50mg  bid and Tramadol prn for back and BLE neuropathic pain.         Loss of weight    Stabilized.       Hypertension - Primary (Chronic)    Controlled, takes Carvedilol 3.125mg , Furosemide(held 03/16/14 creatinine 2.17), Lisinopril 5mg . Bun/creat improves. Creatinine 1.31 02/03/14 03/21/14 creat 2.32-hold Furosemide, Lisinopril, and Atorvastatin 03/29/14 Bun/creat 22/1.47-BNP 470-resumed Lasix 20mg  and started Spironolactone 25mg  daily, update BMP one week.          GERD (gastroesophageal reflux disease)    C/o difficulty swallowing. F/u GI. Continue Nexium 40mg  po daily.        Edema (Chronic)    Trace only in BLE. No s/s of developing CHF. Hold Furosemide 03/16/14 creat 2.17 03/21/14 creat 2.32, still trace edema BLE, continue to hold Furosemide 03/29/14 Bun/creat 22/1.47-BNP 470-resumed Lasix 20mg  and started Spironolactone 25mg  daily, update BMP one week.          CVA (cerebral vascular accident)    In setting of hx of cerebral atherosclerosis-left facial and limb weakness.  03/15/14 US carotid: minimal plaque. No significant carotid artery stenosis.  03/15/14 EKG SR 03/16/14 CT head w/o contrast:  IMPRESSION: No  acute intracranial abnormality. Multiple lacunar infarcts, someIn setting of hx of cerebral atherosclerosis-left facial and limb weakness.  03/15/14 US carotid: minimal plaque. No significant carotid artery stenosis.  03/15/14 EKG SR 03/16/14 CT head w/o contrast:  IMPRESSION: No acute intracranial abnormality. Multiple lacunar infarcts, some of which are new since 10/20/2012 but not felt to be acute.  Stable chronic atrophy.  Will discuss ASA vs Xarelto if creatinine returns to her baseline. Creat 2.17 03/15/14-hold Furosemide and Kcl-repeat BMP.  03/21/14 persisted elevated creatinine 2.32 03/21/14-will continue to hold Furosemide, will hold Lisinopril and Atorvastatin-update BMP    of which are new since 10/20/2012 but not felt to be acute.  Stable chronic atrophy.  Will discuss ASA vs Xarelto if creatinine returns to her baseline. Creat 2.17 03/15/14-hold Furosemide and Kcl-repeat BMP.  04/14/14 Furosemide 20mg  and Spironolactone 25mg  daily.       Chronic diastolic congestive heart failure (Chronic)    Clinically compensated. 03/16/14 hold Furosemide 20mg  and Kcl 37meq daily. Bun/creat 15/1.31 02/03/14 and 27/2.17 03/15/14 03/29/14 Bun/creat 22/1.47-BNP 470-resumed Lasix 20mg  and started Spironolactone 25mg  daily, update BMP one week.           Anxiety state    Stable, cont Mirtazapine 7.5mg  nightly         Acute on chronic renal insufficiency    Baseline creat 1.2-1.3. 03/15/14 creat 2.17. Hold Furosemide.  03/21/14 creat 2.32-continue to hold Furosemide, will hold  Atorvastatin and Lisinopril as well. Repeat BMP and obtain renal US 03/22/14 renal US: symmetric but diminutive orthotopic kidneys without hydronephrosis, raising question of non spectific chronic or remote bilateral rena insult.  03/29/14 Bun/creat 22/1.47-BNP 470-resumed Lasix 20mg  and started Spironolactone 25mg  daily, update BMP one week.  04/06/14 Bun/creat 29/1.76 continue Furosemide 20mg , Spironolactone 25mg , dc  Lisinopril if bp ok. BMP 2 weeks.              Review of Systems:  Review of Systems  Constitutional: Negative for fever, chills, weight loss, malaise/fatigue and diaphoresis.  HENT: Positive for hearing loss. Negative for congestion, ear discharge, ear pain, nosebleeds, sore throat and tinnitus.   Eyes: Negative for blurred vision, double vision, photophobia, pain, discharge and redness.  Respiratory: Negative for cough, hemoptysis, sputum production, shortness of breath, wheezing and stridor.   Cardiovascular: Positive for leg swelling. Negative for chest pain, palpitations, orthopnea, claudication and PND.  Gastrointestinal: Negative for heartburn, nausea, vomiting, abdominal pain, diarrhea, constipation, blood in stool and melena.  Genitourinary: Positive for frequency. Negative for dysuria, urgency, hematuria and flank pain.  Musculoskeletal: Positive for back pain. Negative for myalgias, joint pain, falls and neck pain.  Skin: Negative for itching and rash.  Neurological: Positive for sensory change. Negative for dizziness, tingling, tremors, speech change, focal weakness, seizures, loss of consciousness, weakness and headaches.  Endo/Heme/Allergies: Negative for environmental allergies and polydipsia. Does not bruise/bleed easily.  Psychiatric/Behavioral: Positive for depression and memory loss. Negative for suicidal ideas, hallucinations and substance abuse. The patient is nervous/anxious. The patient does not have insomnia.      Past Medical History  Diagnosis Date  . Senile osteoporosis   . Heart murmur   . Small bowel obstruction   . GERD (gastroesophageal reflux disease)   . Hypertension   . Glaucoma   . Arthritis     Osteoarthritis  . Personal history of fall   . Edema   . Hyperlipidemia   . Diverticulosis of colon (without mention of hemorrhage)   . Other malaise and fatigue   . Hypertonicity of bladder   . Osteoarthrosis, unspecified whether generalized or  localized, unspecified site   . Lumbago   . Edema   . Pain in joint, pelvic region and thigh   . Pain in joint, shoulder region   . Flat feet   . Cerebral ischemia 12/19/2012    Microvascular   . Cerebral atrophy 12/19/2012  . Cerebral atherosclerosis 12/19/2012  . RLQ abdominal pain 01/17/2011   Past Surgical History  Procedure Laterality Date  . Tonsillectomy    . Colon surgery  02/1996    Colectomy, partial for diverticular bleed 90%  . Joint replacement  12/2000    Left knee; Dr. Wynelle Link  . Breast lumpectomy Bilateral   . Laparoscopic lysis of adhesions  11/15/10    Exploratory   Social History:   reports that she quit smoking about 33 years ago. Her smoking use included Cigarettes. She has a 15 pack-year smoking history. She has never used smokeless tobacco. She reports that she drinks about 0.6 oz of alcohol per week. She reports that she does not use illicit drugs.  Family History  Problem Relation Age of Onset  . Cancer Father 71    colon    Medications: Patient's Medications  New Prescriptions   No medications on file  Previous Medications   ASPIRIN 81 MG TABLET    Take 81 mg by mouth daily.     ATORVASTATIN (LIPITOR) 20 MG  TABLET    Take 20 mg by mouth daily.   BETIMOL 0.5 % OPHTHALMIC SOLUTION    Place 1 drop into both eyes every morning.    CALCIUM CARBONATE (OS-CAL) 600 MG TABS    Take 600 mg by mouth daily.   CARVEDILOL (COREG) 3.125 MG TABLET    TAKE 1 TABLET BY MOUTH TWICE A DAY WITH A MEAL   CHOLECALCIFEROL (VITAMIN D3) 2000 UNITS TABS    Take 2,000 Units by mouth daily.     CYANOCOBALAMIN (B-12) 2000 MCG TABS    Take 2,000 mcg by mouth 3 (three) times a week. Monday, Wednesday, AND Friday   FUROSEMIDE (LASIX) 20 MG TABLET    TAKE 1 TABLET BY MOUTH ONCE DAILY   KLOR-CON M10 10 MEQ TABLET    TAKE 1 TABLET BY MOUTH EVERY DAY   LATANOPROST (XALATAN) 0.005 % OPHTHALMIC SOLUTION    Place 1 drop into both eyes at bedtime.    LYRICA 50 MG CAPSULE    TAKE ONE  CAPSULE BY MOUTH TWICE A DAY   MIRTAZAPINE (REMERON) 7.5 MG TABLET    TAKE 1 TABLET (7.5 MG TOTAL) BY MOUTH AT BEDTIME. NIGHTLY FOR APPETITE   MULTIPLE VITAMIN (MULTIVITAMIN) CAPSULE    Take 1 capsule by mouth daily.     OMEGA-3 FATTY ACIDS (FISH OIL) 1200 MG CAPS    Take 1,200 mg by mouth 2 (two) times daily.     OMEPRAZOLE (PRILOSEC) 20 MG CAPSULE    Take 20 mg by mouth daily.   SPIRONOLACTONE (ALDACTONE) 25 MG TABLET    Take 25 mg by mouth daily.  Modified Medications   No medications on file  Discontinued Medications   No medications on file     Physical Exam: Physical Exam  Constitutional: She is oriented to person, place, and time. She appears well-developed and well-nourished. No distress.  HENT:  Head: Normocephalic and atraumatic.  Right Ear: External ear normal.  Left Ear: External ear normal.  Nose: Nose normal.  Mouth/Throat: Oropharynx is clear and moist. No oropharyngeal exudate.  Left lower inner lip small ulcer.   Eyes: Conjunctivae and EOM are normal. Right eye exhibits no discharge. Left eye exhibits no discharge. No scleral icterus. Right pupil is round and reactive. Left pupil is round. Pupils are equal.  Left pupil slow reaction.   Neck: Normal range of motion. Neck supple. No JVD present. No thyromegaly present.  Cardiovascular: Normal rate and regular rhythm.   Murmur heard.  Systolic murmur is present with a grade of 2/6  Pulmonary/Chest: Effort normal and breath sounds normal. No respiratory distress. She has no wheezes. She has no rales. She exhibits no tenderness.  Abdominal: Soft. Bowel sounds are normal. She exhibits no distension. There is no tenderness. There is no rebound.  Musculoskeletal: Normal range of motion. She exhibits edema (trace) and tenderness.  S/p L TKR. C/o R knee pain.  Lymphadenopathy:    She has no cervical adenopathy.  Neurological: She is alert and oriented to person, place, and time. She has normal reflexes. No cranial nerve  deficit. She exhibits abnormal muscle tone. Coordination normal.  Skin: Skin is warm and dry. No rash noted. She is not diaphoretic. No erythema.  abd surgical scar 1998 subtotal colectomy. S/p TKR-left  Psychiatric: She has a normal mood and affect. Her behavior is normal. Judgment and thought content normal.    There were no vitals filed for this visit.    Labs reviewed: Basic Metabolic Panel:  Recent  Labs  02/03/14  03/21/14 03/29/14 04/05/14  NA 141  < > 137 142 142  K 3.8  < > 3.9 4.4 3.9  BUN 15  < > 31* 22* 29*  CREATININE 1.3*  < > 2.3* 1.5* 1.8*  TSH 2.29  --   --   --   --   < > = values in this interval not displayed. Liver Function Tests:  Recent Labs  09/28/13 02/03/14 03/15/14  AST 17 18 18   ALT 10 8 8   ALKPHOS 47 49 48   No results for input(s): LIPASE, AMYLASE in the last 8760 hours. No results for input(s): AMMONIA in the last 8760 hours. CBC:  Recent Labs  02/03/14 03/15/14  WBC 5.5 6.6  HGB 12.3 12.6  HCT 36 38  PLT 255 234   Lipid Panel:  Recent Labs  09/28/13 03/15/14  CHOL 191 190  HDL 62 54  LDLCALC 108 21  TRIG 104 104    Past Procedures:  03/16/14 CT head w/o contrast:  IMPRESSION: No acute intracranial abnormality. Multiple lacunar infarcts, some of which are new since 10/20/2012 but not felt to be acute.  Stable chronic atrophy.   Assessment/Plan Hypertension Controlled, takes Carvedilol 3.125mg , Furosemide(held 03/16/14 creatinine 2.17), Lisinopril 5mg . Bun/creat improves. Creatinine 1.31 02/03/14 03/21/14 creat 2.32-hold Furosemide, Lisinopril, and Atorvastatin 03/29/14 Bun/creat 22/1.47-BNP 470-resumed Lasix 20mg  and started Spironolactone 25mg  daily, update BMP one week.       Edema Trace only in BLE. No s/s of developing CHF. Hold Furosemide 03/16/14 creat 2.17 03/21/14 creat 2.32, still trace edema BLE, continue to hold Furosemide 03/29/14 Bun/creat 22/1.47-BNP 470-resumed Lasix 20mg  and started Spironolactone 25mg   daily, update BMP one week.       Chronic diastolic congestive heart failure Clinically compensated. 03/16/14 hold Furosemide 20mg  and Kcl 47meq daily. Bun/creat 15/1.31 02/03/14 and 27/2.17 03/15/14 03/29/14 Bun/creat 22/1.47-BNP 470-resumed Lasix 20mg  and started Spironolactone 25mg  daily, update BMP one week.        Lumbago Takes Lyrica 50mg  bid and Tramadol prn for back and BLE neuropathic pain.      GERD (gastroesophageal reflux disease) C/o difficulty swallowing. F/u GI. Continue Nexium 40mg  po daily.     Anxiety state Stable, cont Mirtazapine 7.5mg  nightly      Loss of weight Stabilized.    CVA (cerebral vascular accident) In setting of hx of cerebral atherosclerosis-left facial and limb weakness.  03/15/14 US carotid: minimal plaque. No significant carotid artery stenosis.  03/15/14 EKG SR 03/16/14 CT head w/o contrast:  IMPRESSION: No acute intracranial abnormality. Multiple lacunar infarcts, someIn setting of hx of cerebral atherosclerosis-left facial and limb weakness.  03/15/14 US carotid: minimal plaque. No significant carotid artery stenosis.  03/15/14 EKG SR 03/16/14 CT head w/o contrast:  IMPRESSION: No acute intracranial abnormality. Multiple lacunar infarcts, some of which are new since 10/20/2012 but not felt to be acute.  Stable chronic atrophy.  Will discuss ASA vs Xarelto if creatinine returns to her baseline. Creat 2.17 03/15/14-hold Furosemide and Kcl-repeat BMP.  03/21/14 persisted elevated creatinine 2.32 03/21/14-will continue to hold Furosemide, will hold Lisinopril and Atorvastatin-update BMP    of which are new since 10/20/2012 but not felt to be acute.  Stable chronic atrophy.  Will discuss ASA vs Xarelto if creatinine returns to her baseline. Creat 2.17 03/15/14-hold Furosemide and Kcl-repeat BMP.  04/14/14 Furosemide 20mg  and Spironolactone 25mg  daily.    Acute on chronic renal insufficiency Baseline creat 1.2-1.3. 03/15/14 creat  2.17. Hold Furosemide.  03/21/14 creat 2.32-continue  to hold Furosemide, will hold Atorvastatin and Lisinopril as well. Repeat BMP and obtain renal US 03/22/14 renal US: symmetric but diminutive orthotopic kidneys without hydronephrosis, raising question of non spectific chronic or remote bilateral rena insult.  03/29/14 Bun/creat 22/1.47-BNP 470-resumed Lasix 20mg  and started Spironolactone 25mg  daily, update BMP one week.  04/06/14 Bun/creat 29/1.76 continue Furosemide 20mg , Spironolactone 25mg , dc Lisinopril if bp ok. BMP 2 weeks.        Right shoulder pain 03/18/14 Xray R shoulder: potential glenohumeral joint effusion or hemarthrosis with respect to potentital ssociated acute osseous fracture injury. Glenohumeral degenerative arthrosis changes are notably advanced.  03/20/08 fell.      Mouth sore Inner left lower lip-small ulcer-Magic Mouth Wash 62ml s/s ac and hs x 2weeks.  04/14/14 healed.       Family/ Staff Communication: observe the patient  Goals of Care: SNF  Labs/tests ordered: BMP. Renal US   This encounter was created in error - please disregard.

## 2014-04-14 NOTE — Assessment & Plan Note (Signed)
03/18/14 Xray R shoulder: potential glenohumeral joint effusion or hemarthrosis with respect to potentital ssociated acute osseous fracture injury. Glenohumeral degenerative arthrosis changes are notably advanced.  03/20/08 fell.

## 2014-04-14 NOTE — Assessment & Plan Note (Signed)
C/o difficulty swallowing. F/u GI. Continue Nexium 40mg  po daily.

## 2014-04-14 NOTE — Assessment & Plan Note (Signed)
Takes Lyrica 50mg  bid and Tramadol prn for back and BLE neuropathic pain.

## 2014-04-19 ENCOUNTER — Telehealth: Payer: Self-pay | Admitting: *Deleted

## 2014-04-19 NOTE — Telephone Encounter (Signed)
Patient called to inform Dr. Nyoka Cowden that the nurse would not be able to do her lab work until sometime next week. Wanted to know if she should still keep her appointment on Thursday. I informed her that she should still keep her appointment and we will get the results of her labs after they are completed next week.

## 2014-04-21 ENCOUNTER — Other Ambulatory Visit: Payer: Self-pay | Admitting: *Deleted

## 2014-04-21 ENCOUNTER — Encounter: Payer: Self-pay | Admitting: Internal Medicine

## 2014-04-21 ENCOUNTER — Non-Acute Institutional Stay: Payer: Medicare Other | Admitting: Internal Medicine

## 2014-04-21 VITALS — BP 120/62 | HR 72 | Temp 97.3°F | Ht 67.0 in | Wt 126.0 lb

## 2014-04-21 DIAGNOSIS — R0600 Dyspnea, unspecified: Secondary | ICD-10-CM

## 2014-04-21 DIAGNOSIS — I5032 Chronic diastolic (congestive) heart failure: Secondary | ICD-10-CM | POA: Diagnosis not present

## 2014-04-21 DIAGNOSIS — M25561 Pain in right knee: Secondary | ICD-10-CM | POA: Diagnosis not present

## 2014-04-21 DIAGNOSIS — I639 Cerebral infarction, unspecified: Secondary | ICD-10-CM

## 2014-04-21 DIAGNOSIS — M5440 Lumbago with sciatica, unspecified side: Secondary | ICD-10-CM | POA: Diagnosis not present

## 2014-04-21 DIAGNOSIS — R609 Edema, unspecified: Secondary | ICD-10-CM | POA: Diagnosis not present

## 2014-04-21 DIAGNOSIS — M2141 Flat foot [pes planus] (acquired), right foot: Secondary | ICD-10-CM | POA: Diagnosis not present

## 2014-04-21 DIAGNOSIS — R634 Abnormal weight loss: Secondary | ICD-10-CM

## 2014-04-21 DIAGNOSIS — M2142 Flat foot [pes planus] (acquired), left foot: Secondary | ICD-10-CM | POA: Diagnosis not present

## 2014-04-21 DIAGNOSIS — K219 Gastro-esophageal reflux disease without esophagitis: Secondary | ICD-10-CM

## 2014-04-21 DIAGNOSIS — I10 Essential (primary) hypertension: Secondary | ICD-10-CM

## 2014-04-21 DIAGNOSIS — E785 Hyperlipidemia, unspecified: Secondary | ICD-10-CM | POA: Diagnosis not present

## 2014-04-21 LAB — BASIC METABOLIC PANEL
BUN: 37 mg/dL — AB (ref 4–21)
CREATININE: 2.8 mg/dL — AB (ref 0.5–1.1)
Potassium: 4.8 mmol/L (ref 3.4–5.3)
Sodium: 140 mmol/L (ref 137–147)

## 2014-04-21 MED ORDER — TRAMADOL HCL 50 MG PO TABS
ORAL_TABLET | ORAL | Status: DC
Start: 2014-04-21 — End: 2014-08-20

## 2014-04-21 MED ORDER — SPIRONOLACTONE 25 MG PO TABS: 25.0000 mg | ORAL_TABLET | Freq: Every day | ORAL | Status: DC

## 2014-04-21 MED ORDER — ATORVASTATIN CALCIUM 20 MG PO TABS: ORAL_TABLET | ORAL | Status: DC

## 2014-04-21 NOTE — Telephone Encounter (Signed)
[facility]  

## 2014-04-21 NOTE — Progress Notes (Signed)
Failed clock drawing  

## 2014-04-21 NOTE — Progress Notes (Signed)
Patient ID: [name], female   DOB: 07/18/1925, 81 y.o.   MRN: [id number]    HISTORY AND PHYSICAL  Location:  [place] [facility] Service: Clinic (12)   Extended Emergency Contact Information Primary Emergency Contact: [name],[name] Address: [address]          [place] [address] Johnnette Litter of [place] Phone: [phone] Mobile Phone: [phone] Relation: Daughter  Advanced Directive information Does patient have an advance directive?: Yes, Type of Advance Directive: Healthcare Power of Attorney  Chief Complaint  Patient presents with  . Annual Exam    Comprehensive exam: blood pressure, cholesterol, TIA, anxiety, CVA    HPI:  CVA (cerebral vascular accident): Occurred January 2016. Has had a TIA since then. Has had no further incidents over the last 2 weeks.  Loss of weight: Lost nearly 20 pounds late last fall. She has begun to eat better and has regained a couple of pounds since her last visit.  Gastroesophageal reflux disease without esophagitis: Denies any heartburn or indigestion. Previously had reported food sticking in the esophagus, but this is no longer true.  Essential hypertension: Controlled  Hyperlipidemia: Controlled  Edema: Controlled. Improvement may be part of her weight loss from last fall.  Dyspnea: Improved  Chronic diastolic congestive heart failure: Compensated  Flat feet: Painful to walk on hard surfaces. Bilateral.  Right knee pain: Bone-on-bone with creaking grinding noises  Midline low back pain with sciatica, sciatica laterality unspecified: mild to moderate. Worse in the mornings.    Past Medical History  Diagnosis Date  . Senile osteoporosis   . Heart murmur   . Small bowel obstruction   . GERD (gastroesophageal reflux disease)   . Hypertension   . Glaucoma   . Arthritis     Osteoarthritis  . Personal history of fall   . Edema   . Hyperlipidemia   . Diverticulosis of colon (without  mention of hemorrhage)   . Other malaise and fatigue   . Hypertonicity of bladder   . Osteoarthrosis, unspecified whether generalized or localized, unspecified site   . Lumbago   . Edema   . Pain in joint, pelvic region and thigh   . Pain in joint, shoulder region   . Flat feet   . Cerebral ischemia 12/19/2012    Microvascular   . Cerebral atrophy 12/19/2012  . Cerebral atherosclerosis 12/19/2012  . RLQ abdominal pain 01/17/2011    Past Surgical History  Procedure Laterality Date  . Tonsillectomy    . Colon surgery  02/1996    Colectomy, partial for diverticular bleed 90%  . Joint replacement  12/2000    Left knee; Dr. Wynelle Link  . Breast lumpectomy Bilateral   . Laparoscopic lysis of adhesions  11/15/10    Exploratory    Patient Care Team: Estill Dooms, MD as PCP - General (Internal Medicine) Juanita Craver, MD as Consulting Physician (Gastroenterology) Rob Hickman, MD as Consulting Physician (Ophthalmology) Thornell Sartorius, MD as Consulting Physician (Otolaryngology) [place] Man Mast Rhunette Croft, NP as Nurse Practitioner (Nurse Practitioner) Bo Merino, MD as Consulting Physician (Rheumatology) Cindee Salt, MD as Consulting Physician (Physical Medicine and Rehabilitation) Augustin Schooling, MD as Consulting Physician (Orthopedic Surgery)  History   Social History  . Marital Status: Widowed    Spouse Name: N/A  . Number of Children: N/A  . Years of Education: N/A   Occupational History  . Not on file.   Social History Main Topics  .  Smoking status: Former Smoker -- 0.50 packs/day for 30 years    Types: Cigarettes    Quit date: 12/16/1980  . Smokeless tobacco: Never Used  . Alcohol Use: 0.6 oz/week    1 Glasses of wine per week     Comment: occasional glass of wine  . Drug Use: No  . Sexual Activity: No   Other Topics Concern  . Not on file   Social History Narrative   Lives at [facility] 05/2011   Widowed 2003   Living Will    [place] with cane   Never smoked    Exercise none      reports that she quit smoking about 33 years ago. Her smoking use included Cigarettes. She has a 15 pack-year smoking history. She has never used smokeless tobacco. She reports that she drinks about 0.6 oz of alcohol per week. She reports that she does not use illicit drugs.  Family History  Problem Relation Age of Onset  . Cancer Father 50    colon   Family Status  Relation Status Death Age  . Father Deceased 21    Cause of Death: Colon cancer  . Mother Deceased 41    Cause of Death: Unknown  . Brother Alive   . Daughter Alive     Immunization History  Administered Date(s) Administered  . Influenza Whole 11/19/2011, 12/02/2012  . Influenza-Unspecified 12/06/2013  . Pneumococcal Polysaccharide-23 02/18/2009    Allergies  Allergen Reactions  . Sulfa Antibiotics Itching    Medications: Patient's Medications  New Prescriptions   No medications on file  Previous Medications   ASPIRIN 81 MG TABLET    Take 81 mg by mouth daily.     ATORVASTATIN (LIPITOR) 20 MG TABLET    Take one tablet by mouth once daily for cholesterol   BETIMOL 0.5 % OPHTHALMIC SOLUTION    Place 1 drop into both eyes every morning.    CALCIUM CARBONATE (OS-CAL) 600 MG TABS    Take 600 mg by mouth daily.   CARVEDILOL (COREG) 3.125 MG TABLET    TAKE 1 TABLET BY MOUTH TWICE A DAY WITH A MEAL   CHOLECALCIFEROL (VITAMIN D3) 2000 UNITS TABS    Take 2,000 Units by mouth daily.     CYANOCOBALAMIN (B-12) 2000 MCG TABS    Take 2,000 mcg by mouth 3 (three) times a week. Monday, Wednesday, AND Friday   FUROSEMIDE (LASIX) 20 MG TABLET    TAKE 1 TABLET BY MOUTH ONCE DAILY   KLOR-CON M10 10 MEQ TABLET    TAKE 1 TABLET BY MOUTH EVERY DAY   LATANOPROST (XALATAN) 0.005 % OPHTHALMIC SOLUTION    Place 1 drop into both eyes at bedtime.    LISINOPRIL (PRINIVIL,ZESTRIL) 5 MG TABLET    Take one tablet daily for blood pressure   MIRTAZAPINE (REMERON) 7.5 MG TABLET    TAKE 1  TABLET (7.5 MG TOTAL) BY MOUTH AT BEDTIME. NIGHTLY FOR APPETITE   MULTIPLE VITAMIN (MULTIVITAMIN) CAPSULE    Take 1 capsule by mouth daily.     OMEGA-3 FATTY ACIDS (FISH OIL) 1200 MG CAPS    Take 1,200 mg by mouth 2 (two) times daily.     OMEPRAZOLE (PRILOSEC) 20 MG CAPSULE    Take 20 mg by mouth daily.   PREGABALIN (LYRICA) 50 MG CAPSULE    Take 50 mg by mouth. Take one capsule daily   SPIRONOLACTONE (ALDACTONE) 25 MG TABLET    Take 1 tablet (25 mg total) by  mouth daily.  Modified Medications   No medications on file  Discontinued Medications   LYRICA 50 MG CAPSULE    TAKE ONE CAPSULE BY MOUTH TWICE A DAY    Review of Systems  Constitutional:       Complains of generalized weakness  HENT: Positive for hearing loss. Negative for ear pain.   Eyes: Negative.   Respiratory: Negative.   Cardiovascular: Positive for leg swelling. Negative for chest pain and palpitations.  Gastrointestinal:       Complains of chronic mild right lower quadrant abdominal pain. History of diverticulosis.  Endocrine: Negative.   Genitourinary: Negative.   Musculoskeletal: Positive for back pain and arthralgias. Negative for myalgias, neck pain and neck stiffness.       Flat feet with dropped arches. Pin in the right medial malleolar bones. Chronic back pain. Chronic right shoulder pain.  Skin: Negative.   Neurological: Positive for weakness (Generalized).       Complaints of confusion earlier today  Hematological:       History of anemia.  Psychiatric/Behavioral: The patient is nervous/anxious.     Filed Vitals:   04/21/14 1451  BP: 120/62  Pulse: 72  Temp: 97.3 F (36.3 C)  TempSrc: Oral  Height: 5\' 7"  (1.702 m)  Weight: 126 lb (57.153 kg)  SpO2: 97%   Body mass index is 19.73 kg/(m^2).  Physical Exam  Constitutional: She is oriented to person, place, and time. She appears well-developed and well-nourished. No distress.  HENT:  Head: Normocephalic and atraumatic.  Right Ear: External ear  normal.  Left Ear: External ear normal.  Nose: Nose normal.  Mouth/Throat: Oropharynx is clear and moist. No oropharyngeal exudate.  Eyes: Conjunctivae and EOM are normal. Pupils are equal, round, and reactive to light. Right eye exhibits no discharge. Left eye exhibits no discharge. No scleral icterus.  Neck: Normal range of motion. Neck supple. No JVD present. No thyromegaly present.  Cardiovascular: Normal rate and regular rhythm.   Murmur heard.  Systolic murmur is present with a grade of 2/6  Pulmonary/Chest: Effort normal and breath sounds normal. No respiratory distress. She has no wheezes. She has no rales. She exhibits no tenderness.  Abdominal: Soft. Bowel sounds are normal. She exhibits no distension. There is no tenderness. There is no rebound.  Musculoskeletal: Normal range of motion. She exhibits edema (1+  bipedal). She exhibits no tenderness.  Right shoulder discomfort with abduction and rotation.  Lymphadenopathy:    She has no cervical adenopathy.  Neurological: She is alert and oriented to person, place, and time. She has normal reflexes. No cranial nerve deficit. She exhibits normal muscle tone. Coordination normal.  04/21/14 MMSE 28/30. Failed clock drawing. Mild stutter.  Skin: Skin is warm and dry. No rash noted. She is not diaphoretic. No erythema.  Psychiatric: She has a normal mood and affect. Her behavior is normal. Judgment and thought content normal.     Labs reviewed: Lab on 04/06/2014  Component Date Value Ref Range Status  . Glucose 04/05/2014 88   Final  . BUN 04/05/2014 29* 4 - 21 mg/dL Final  . Creatinine 04/05/2014 1.8* 0.5 - 1.1 mg/dL Final  . Potassium 04/05/2014 3.9  3.4 - 5.3 mmol/L Final  . Sodium 04/05/2014 142  137 - 147 mmol/L Final  Lab on 03/31/2014  Component Date Value Ref Range Status  . Glucose 03/29/2014 84   Final  . BUN 03/29/2014 22* 4 - 21 mg/dL Final  . Creatinine 03/29/2014 1.5* 0.5 -  1.1 mg/dL Final  . Potassium 03/29/2014  4.4  3.4 - 5.3 mmol/L Final  . Sodium 03/29/2014 142  137 - 147 mmol/L Final  Lab on 03/21/2014  Component Date Value Ref Range Status  . Glucose 03/21/2014 93   Final  . BUN 03/21/2014 31* 4 - 21 mg/dL Final  . Creatinine 03/21/2014 2.3* 0.5 - 1.1 mg/dL Final  . Potassium 03/21/2014 3.9  3.4 - 5.3 mmol/L Final  . Sodium 03/21/2014 137  137 - 147 mmol/L Final  Lab on 03/16/2014  Component Date Value Ref Range Status  . Hemoglobin 03/15/2014 12.6  12.0 - 16.0 g/dL Final  . HCT 03/15/2014 38  36 - 46 % Final  . Platelets 03/15/2014 234  150 - 399 K/L Final  . WBC 03/15/2014 6.6   Final  . Glucose 03/15/2014 82   Final  . BUN 03/15/2014 27* 4 - 21 mg/dL Final  . Creatinine 03/15/2014 2.2* 0.5 - 1.1 mg/dL Final  . Potassium 03/15/2014 4.4  3.4 - 5.3 mmol/L Final  . Sodium 03/15/2014 140  137 - 147 mmol/L Final  . Triglycerides 03/15/2014 104  40 - 160 mg/dL Final  . Cholesterol 03/15/2014 190  0 - 200 mg/dL Final  . HDL 03/15/2014 54  35 - 70 mg/dL Final  . LDL Cholesterol 03/15/2014 21   Final  . Alkaline Phosphatase 03/15/2014 48  25 - 125 U/L Final  . ALT 03/15/2014 8  7 - 35 U/L Final  . AST 03/15/2014 18  13 - 35 U/L Final  . Bilirubin, Total 03/15/2014 0.7   Final  Abstract on 03/02/2014  Component Date Value Ref Range Status  . BUN 03/01/2014 29* 4 - 21 mg/dL Final  . Creatinine 03/01/2014 1.2* 0.5 - 1.1 mg/dL Final  . Potassium 03/01/2014 4.3  3.4 - 5.3 mmol/L Final  . Sodium 03/01/2014 147  137 - 147 mmol/L Final  Nursing Home on 02/03/2014  Component Date Value Ref Range Status  . Hemoglobin 02/03/2014 12.3  12.0 - 16.0 g/dL Final  . HCT 02/03/2014 36  36 - 46 % Final  . Platelets 02/03/2014 255  150 - 399 K/L Final  . WBC 02/03/2014 5.5   Final  . Glucose 02/03/2014 89   Final  . BUN 02/03/2014 15  4 - 21 mg/dL Final  . Creatinine 02/03/2014 1.3* 0.5 - 1.1 mg/dL Final  . Potassium 02/03/2014 3.8  3.4 - 5.3 mmol/L Final  . Sodium 02/03/2014 141  137 - 147 mmol/L  Final  . Alkaline Phosphatase 02/03/2014 49  25 - 125 U/L Final  . ALT 02/03/2014 8  7 - 35 U/L Final  . AST 02/03/2014 18  13 - 35 U/L Final  . Bilirubin, Total 02/03/2014 0.8   Final  . TSH 02/03/2014 2.29  0.41 - 5.90 uIU/mL Final    Ct Head Wo Contrast  03/16/2014   CLINICAL DATA:  Left-sided weakness. Aphasia. Or confusion. Head trauma 1 week ago secondary to a fall. TIA symptoms 2 weeks ago.  EXAM: CT HEAD WITHOUT CONTRAST  TECHNIQUE: Contiguous axial images were obtained from the base of the skull through the vertex without intravenous contrast.  COMPARISON:  10/20/2012  FINDINGS: There is no acute intracranial hemorrhage or mass lesion infarction.  There is diffuse cerebral cortical and cerebellar atrophy with secondary ventricular dilatation.  There are lacunar infarcts in the basal ganglia bilaterally, new on the right and new and the left head of the caudate. There is an old  lacunar infarct in the body of the left caudate. Small lacunar infarct in the right side of the pons, new since the prior study.  Dense calcification is present in both vertebral arteries distally.  No osseous abnormality.  IMPRESSION: No acute intracranial abnormality. Multiple lacunar infarcts, some of which are new since 10/20/2012 but not felt to be acute.  Stable chronic atrophy.   Electronically Signed   By: Rozetta Nunnery M.D.   On: 03/16/2014 11:19     Assessment/Plan  1. CVA (cerebral vascular accident) Not having residual issues at this time.  2. Loss of weight Continue to monitor - TSH; Future - Comprehensive metabolic panel; Future  3. Gastroesophageal reflux disease without esophagitis Denies dysphagia, odynophagia, or chest discomfort  4. Essential hypertension Controlled - Comprehensive metabolic panel; Future  5. Hyperlipidemia Controlled  6. Edema Improved  7. Dyspnea Improved  8. Chronic diastolic congestive heart failure Compensated  9. Flat feet Advised arch supports  10.  Right knee pain She will continue to " do the best I can" with her knee discomfort.  11. Midline low back pain with sciatica, sciatica laterality unspecified Chronic and unchanged

## 2014-04-22 ENCOUNTER — Encounter: Payer: Self-pay | Admitting: *Deleted

## 2014-04-22 ENCOUNTER — Encounter: Payer: Self-pay | Admitting: Internal Medicine

## 2014-05-06 ENCOUNTER — Other Ambulatory Visit: Payer: Self-pay | Admitting: *Deleted

## 2014-05-06 MED ORDER — CARVEDILOL 3.125 MG PO TABS: ORAL_TABLET | ORAL | Status: DC

## 2014-05-06 NOTE — Telephone Encounter (Signed)
[facility]  

## 2014-05-26 ENCOUNTER — Other Ambulatory Visit: Payer: Self-pay | Admitting: Nurse Practitioner

## 2014-06-03 ENCOUNTER — Telehealth: Payer: Self-pay

## 2014-06-03 NOTE — Telephone Encounter (Signed)
Patient had called to talk with Dr. Nyoka Cowden, asked if I could help. She just saw her eye doctor and had DVM form, he told her he didn't think she should drive and wondered what Dr. Nyoka Cowden could do about it. Told her that Dr. Nyoka Cowden could not change what the eye doctor wrote on the form, there is nothing he could do about it. She knows she had a little TIA, but she doesn't think she has any problem with it. If she doesn't drive she'll just set around and get bored. Told patient that [place] has a bus that take people shopping, they always have actives. She knows, she just has to take advantage of what they offer. Appreciated me talking with her.

## 2014-08-16 ENCOUNTER — Emergency Department (HOSPITAL_COMMUNITY): Payer: Medicare Other

## 2014-08-16 ENCOUNTER — Encounter (HOSPITAL_COMMUNITY): Payer: Self-pay | Admitting: Emergency Medicine

## 2014-08-16 ENCOUNTER — Inpatient Hospital Stay (HOSPITAL_COMMUNITY)
Admission: EM | Admit: 2014-08-16 | Discharge: 2014-08-20 | DRG: 682 | Disposition: A | Discharge status: Home Health | Payer: Medicare Other | Attending: Internal Medicine | Admitting: Internal Medicine

## 2014-08-16 DIAGNOSIS — Z79891 Long term (current) use of opiate analgesic: Secondary | ICD-10-CM

## 2014-08-16 DIAGNOSIS — E875 Hyperkalemia: Secondary | ICD-10-CM | POA: Diagnosis present

## 2014-08-16 DIAGNOSIS — R350 Frequency of micturition: Secondary | ICD-10-CM | POA: Diagnosis present

## 2014-08-16 DIAGNOSIS — I5032 Chronic diastolic (congestive) heart failure: Secondary | ICD-10-CM | POA: Diagnosis present

## 2014-08-16 DIAGNOSIS — E785 Hyperlipidemia, unspecified: Secondary | ICD-10-CM | POA: Diagnosis present

## 2014-08-16 DIAGNOSIS — N179 Acute kidney failure, unspecified: Principal | ICD-10-CM | POA: Diagnosis present

## 2014-08-16 DIAGNOSIS — N318 Other neuromuscular dysfunction of bladder: Secondary | ICD-10-CM | POA: Diagnosis present

## 2014-08-16 DIAGNOSIS — Z882 Allergy status to sulfonamides status: Secondary | ICD-10-CM

## 2014-08-16 DIAGNOSIS — Z7982 Long term (current) use of aspirin: Secondary | ICD-10-CM

## 2014-08-16 DIAGNOSIS — B9620 Unspecified Escherichia coli [E. coli] as the cause of diseases classified elsewhere: Secondary | ICD-10-CM | POA: Diagnosis present

## 2014-08-16 DIAGNOSIS — E872 Acidosis: Secondary | ICD-10-CM | POA: Diagnosis present

## 2014-08-16 DIAGNOSIS — D631 Anemia in chronic kidney disease: Secondary | ICD-10-CM | POA: Diagnosis present

## 2014-08-16 DIAGNOSIS — N390 Urinary tract infection, site not specified: Secondary | ICD-10-CM | POA: Diagnosis present

## 2014-08-16 DIAGNOSIS — K219 Gastro-esophageal reflux disease without esophagitis: Secondary | ICD-10-CM | POA: Diagnosis present

## 2014-08-16 DIAGNOSIS — D649 Anemia, unspecified: Secondary | ICD-10-CM | POA: Diagnosis not present

## 2014-08-16 DIAGNOSIS — D638 Anemia in other chronic diseases classified elsewhere: Secondary | ICD-10-CM | POA: Diagnosis present

## 2014-08-16 DIAGNOSIS — M810 Age-related osteoporosis without current pathological fracture: Secondary | ICD-10-CM | POA: Diagnosis present

## 2014-08-16 DIAGNOSIS — Z87891 Personal history of nicotine dependence: Secondary | ICD-10-CM | POA: Diagnosis not present

## 2014-08-16 DIAGNOSIS — T500X5A Adverse effect of mineralocorticoids and their antagonists, initial encounter: Secondary | ICD-10-CM | POA: Diagnosis present

## 2014-08-16 DIAGNOSIS — Z79899 Other long term (current) drug therapy: Secondary | ICD-10-CM

## 2014-08-16 DIAGNOSIS — N183 Chronic kidney disease, stage 3 (moderate): Secondary | ICD-10-CM | POA: Diagnosis present

## 2014-08-16 DIAGNOSIS — H409 Unspecified glaucoma: Secondary | ICD-10-CM | POA: Diagnosis present

## 2014-08-16 DIAGNOSIS — Z800 Family history of malignant neoplasm of digestive organs: Secondary | ICD-10-CM

## 2014-08-16 DIAGNOSIS — K921 Melena: Secondary | ICD-10-CM | POA: Diagnosis present

## 2014-08-16 DIAGNOSIS — M1990 Unspecified osteoarthritis, unspecified site: Secondary | ICD-10-CM | POA: Diagnosis present

## 2014-08-16 DIAGNOSIS — T501X5A Adverse effect of loop [high-ceiling] diuretics, initial encounter: Secondary | ICD-10-CM | POA: Diagnosis present

## 2014-08-16 DIAGNOSIS — G9340 Encephalopathy, unspecified: Secondary | ICD-10-CM | POA: Diagnosis present

## 2014-08-16 DIAGNOSIS — R05 Cough: Secondary | ICD-10-CM

## 2014-08-16 DIAGNOSIS — I672 Cerebral atherosclerosis: Secondary | ICD-10-CM | POA: Diagnosis present

## 2014-08-16 DIAGNOSIS — I129 Hypertensive chronic kidney disease with stage 1 through stage 4 chronic kidney disease, or unspecified chronic kidney disease: Secondary | ICD-10-CM | POA: Diagnosis present

## 2014-08-16 DIAGNOSIS — Z87440 Personal history of urinary (tract) infections: Secondary | ICD-10-CM | POA: Diagnosis not present

## 2014-08-16 DIAGNOSIS — R059 Cough, unspecified: Secondary | ICD-10-CM

## 2014-08-16 DIAGNOSIS — I10 Essential (primary) hypertension: Secondary | ICD-10-CM | POA: Diagnosis present

## 2014-08-16 LAB — CREATININE, SERUM
Creatinine, Ser: 4.79 mg/dL — ABNORMAL HIGH (ref 0.44–1.00)
GFR calc Af Amer: 9 mL/min — ABNORMAL LOW (ref >60)
GFR calc non Af Amer: 7 mL/min — ABNORMAL LOW (ref >60)

## 2014-08-16 LAB — TSH
TSH: 2.227 u[IU]/mL (ref 0.350–4.500)
TSH: 2.78 u[IU]/mL (ref 0.41–5.90)

## 2014-08-16 LAB — LIPID PANEL
CHOLESTEROL: 89 mg/dL (ref 0–200)
HDL: 39 mg/dL (ref 35–70)
LDL Cholesterol: 38 mg/dL
Triglycerides: 62 mg/dL (ref 40–160)

## 2014-08-16 LAB — HEPATIC FUNCTION PANEL
ALT: 8 U/L (ref 7–35)
AST: 10 U/L — AB (ref 13–35)
Alkaline Phosphatase: 34 U/L (ref 25–125)
Bilirubin, Total: 0.3 mg/dL

## 2014-08-16 LAB — COMPREHENSIVE METABOLIC PANEL
ALK PHOS: 41 U/L (ref 38–126)
ALT: 8 U/L — ABNORMAL LOW (ref 14–54)
AST: 12 U/L — AB (ref 15–41)
Albumin: 3.8 g/dL (ref 3.5–5.0)
Anion gap: 7 (ref 5–15)
BUN: 93 mg/dL — ABNORMAL HIGH (ref 6–20)
CALCIUM: 9.1 mg/dL (ref 8.9–10.3)
CO2: 17 mmol/L — ABNORMAL LOW (ref 22–32)
Chloride: 112 mmol/L — ABNORMAL HIGH (ref 101–111)
Creatinine, Ser: 5.15 mg/dL — ABNORMAL HIGH (ref 0.44–1.00)
GFR calc Af Amer: 8 mL/min — ABNORMAL LOW (ref >60)
GFR calc non Af Amer: 7 mL/min — ABNORMAL LOW (ref >60)
Glucose, Bld: 103 mg/dL — ABNORMAL HIGH (ref 65–99)
POTASSIUM: 5.9 mmol/L — AB (ref 3.5–5.1)
Sodium: 136 mmol/L (ref 135–145)
TOTAL PROTEIN: 6.7 g/dL (ref 6.5–8.1)
Total Bilirubin: 0.6 mg/dL (ref 0.3–1.2)

## 2014-08-16 LAB — URINE MICROSCOPIC-ADD ON

## 2014-08-16 LAB — CBC
HCT: 27.5 % — ABNORMAL LOW (ref 36.0–46.0)
Hemoglobin: 9.3 g/dL — ABNORMAL LOW (ref 12.0–15.0)
MCH: 30.7 pg (ref 26.0–34.0)
MCHC: 33.8 g/dL (ref 30.0–36.0)
MCV: 90.8 fL (ref 78.0–100.0)
PLATELETS: 209 10*3/uL (ref 150–400)
RBC: 3.03 MIL/uL — AB (ref 3.87–5.11)
RDW: 14.8 % (ref 11.5–15.5)
WBC: 5.4 10*3/uL (ref 4.0–10.5)

## 2014-08-16 LAB — CBC WITH DIFFERENTIAL/PLATELET
BASOS PCT: 0 % (ref 0–1)
Basophils Absolute: 0 10*3/uL (ref 0.0–0.1)
EOS ABS: 0.4 10*3/uL (ref 0.0–0.7)
EOS PCT: 6 % — AB (ref 0–5)
HCT: 28 % — ABNORMAL LOW (ref 36.0–46.0)
Hemoglobin: 9.2 g/dL — ABNORMAL LOW (ref 12.0–15.0)
LYMPHS ABS: 1.7 10*3/uL (ref 0.7–4.0)
Lymphocytes Relative: 28 % (ref 12–46)
MCH: 30 pg (ref 26.0–34.0)
MCHC: 32.9 g/dL (ref 30.0–36.0)
MCV: 91.2 fL (ref 78.0–100.0)
MONOS PCT: 13 % — AB (ref 3–12)
Monocytes Absolute: 0.8 10*3/uL (ref 0.1–1.0)
Neutro Abs: 3.2 10*3/uL (ref 1.7–7.7)
Neutrophils Relative %: 53 % (ref 43–77)
Platelets: 211 10*3/uL (ref 150–400)
RBC: 3.07 MIL/uL — AB (ref 3.87–5.11)
RDW: 14.9 % (ref 11.5–15.5)
WBC: 6 10*3/uL (ref 4.0–10.5)

## 2014-08-16 LAB — BASIC METABOLIC PANEL
BUN: 84 mg/dL — AB (ref 4–21)
Creatinine: 4.9 mg/dL — AB (ref 0.5–1.1)
Glucose: 84 mg/dL
POTASSIUM: 6 mmol/L — AB (ref 3.4–5.3)
Sodium: 138 mmol/L (ref 137–147)

## 2014-08-16 LAB — URINALYSIS, ROUTINE W REFLEX MICROSCOPIC
Bilirubin Urine: NEGATIVE
GLUCOSE, UA: NEGATIVE mg/dL
Hgb urine dipstick: NEGATIVE
KETONES UR: NEGATIVE mg/dL
Nitrite: NEGATIVE
Protein, ur: NEGATIVE mg/dL
Specific Gravity, Urine: 1.009 (ref 1.005–1.030)
UROBILINOGEN UA: 0.2 mg/dL (ref 0.0–1.0)
pH: 5.5 (ref 5.0–8.0)

## 2014-08-16 MED ORDER — OMEGA-3-ACID ETHYL ESTERS 1 G PO CAPS
1.0000 g | ORAL_CAPSULE | Freq: Two times a day (BID) | ORAL | Status: DC
  Administered 2014-08-16 – 2014-08-20 (×8): 1 g via ORAL
  Filled 2014-08-16 (×8): qty 1

## 2014-08-16 MED ORDER — TIMOLOL HEMIHYDRATE 0.5 % OP SOLN
1.0000 [drp] | Freq: Every morning | OPHTHALMIC | Status: DC
  Filled 2014-08-16: qty 10

## 2014-08-16 MED ORDER — VITAMIN B-12 1000 MCG PO TABS
1000.0000 ug | ORAL_TABLET | ORAL | Status: DC
  Administered 2014-08-17 – 2014-08-19 (×4): 1000 ug via ORAL
  Filled 2014-08-16 (×5): qty 1

## 2014-08-16 MED ORDER — SODIUM POLYSTYRENE SULFONATE 15 GM/60ML PO SUSP
15.0000 g | Freq: Once | ORAL | Status: AC
  Administered 2014-08-16: 15 g via ORAL
  Filled 2014-08-16: qty 60

## 2014-08-16 MED ORDER — ACETAMINOPHEN 325 MG PO TABS: 650.0000 mg | ORAL_TABLET | Freq: Four times a day (QID) | ORAL | Status: DC | PRN

## 2014-08-16 MED ORDER — CARVEDILOL 3.125 MG PO TABS
3.1250 mg | ORAL_TABLET | Freq: Two times a day (BID) | ORAL | Status: DC
  Administered 2014-08-17 – 2014-08-20 (×7): 3.125 mg via ORAL
  Filled 2014-08-16 (×7): qty 1

## 2014-08-16 MED ORDER — VITAMIN D3 25 MCG (1000 UNIT) PO TABS
2000.0000 [IU] | ORAL_TABLET | Freq: Every day | ORAL | Status: DC
  Administered 2014-08-17 – 2014-08-20 (×4): 2000 [IU] via ORAL
  Filled 2014-08-16 (×4): qty 2

## 2014-08-16 MED ORDER — ALBUTEROL SULFATE (2.5 MG/3ML) 0.083% IN NEBU
2.5000 mg | INHALATION_SOLUTION | Freq: Once | RESPIRATORY_TRACT | Status: AC
  Administered 2014-08-16: 2.5 mg via RESPIRATORY_TRACT
  Filled 2014-08-16: qty 3

## 2014-08-16 MED ORDER — VITAMIN B-1 100 MG PO TABS
100.0000 mg | ORAL_TABLET | Freq: Every day | ORAL | Status: DC
  Administered 2014-08-16 – 2014-08-20 (×5): 100 mg via ORAL
  Filled 2014-08-16 (×5): qty 1

## 2014-08-16 MED ORDER — MIRTAZAPINE 7.5 MG PO TABS
7.5000 mg | ORAL_TABLET | Freq: Every day | ORAL | Status: DC
Start: 2014-08-16 — End: 2014-08-20
  Administered 2014-08-16 – 2014-08-19 (×4): 7.5 mg via ORAL
  Filled 2014-08-16 (×4): qty 1

## 2014-08-16 MED ORDER — ACETAMINOPHEN 650 MG RE SUPP: 650.0000 mg | Freq: Four times a day (QID) | RECTAL | Status: DC | PRN

## 2014-08-16 MED ORDER — MULTIVITAMINS PO CAPS: 1.0000 | ORAL_CAPSULE | Freq: Every day | ORAL | Status: DC

## 2014-08-16 MED ORDER — PREGABALIN 50 MG PO CAPS
50.0000 mg | ORAL_CAPSULE | Freq: Every day | ORAL | Status: DC
  Administered 2014-08-17 – 2014-08-20 (×4): 50 mg via ORAL
  Filled 2014-08-16 (×4): qty 1

## 2014-08-16 MED ORDER — TIMOLOL MALEATE 0.5 % OP SOLN
1.0000 [drp] | Freq: Every day | OPHTHALMIC | Status: DC
  Administered 2014-08-17 – 2014-08-20 (×4): 1 [drp] via OPHTHALMIC
  Filled 2014-08-16: qty 5

## 2014-08-16 MED ORDER — ADULT MULTIVITAMIN W/MINERALS CH
1.0000 | ORAL_TABLET | Freq: Every day | ORAL | Status: DC
  Administered 2014-08-17 – 2014-08-20 (×4): 1 via ORAL
  Filled 2014-08-16 (×4): qty 1

## 2014-08-16 MED ORDER — ATORVASTATIN CALCIUM 20 MG PO TABS
20.0000 mg | ORAL_TABLET | Freq: Every day | ORAL | Status: DC
  Administered 2014-08-17 – 2014-08-19 (×3): 20 mg via ORAL
  Filled 2014-08-16 (×4): qty 1

## 2014-08-16 MED ORDER — FOLIC ACID 1 MG PO TABS
1.0000 mg | ORAL_TABLET | Freq: Every day | ORAL | Status: DC
  Administered 2014-08-16 – 2014-08-20 (×5): 1 mg via ORAL
  Filled 2014-08-16 (×5): qty 1

## 2014-08-16 MED ORDER — CEFTRIAXONE SODIUM IN DEXTROSE 20 MG/ML IV SOLN
1.0000 g | INTRAVENOUS | Status: DC
  Administered 2014-08-17 – 2014-08-19 (×3): 1 g via INTRAVENOUS
  Filled 2014-08-16 (×3): qty 50

## 2014-08-16 MED ORDER — SODIUM CHLORIDE 0.9 % IV BOLUS (SEPSIS)
500.0000 mL | Freq: Once | INTRAVENOUS | Status: AC
  Administered 2014-08-16: 500 mL via INTRAVENOUS

## 2014-08-16 MED ORDER — LATANOPROST 0.005 % OP SOLN
1.0000 [drp] | Freq: Every day | OPHTHALMIC | Status: DC
  Administered 2014-08-16 – 2014-08-19 (×4): 1 [drp] via OPHTHALMIC
  Filled 2014-08-16: qty 2.5

## 2014-08-16 MED ORDER — ONDANSETRON HCL 4 MG PO TABS: 4.0000 mg | ORAL_TABLET | Freq: Four times a day (QID) | ORAL | Status: DC | PRN

## 2014-08-16 MED ORDER — HEPARIN SODIUM (PORCINE) 5000 UNIT/ML IJ SOLN
5000.0000 [IU] | Freq: Three times a day (TID) | INTRAMUSCULAR | Status: DC
  Administered 2014-08-16 – 2014-08-17 (×2): 5000 [IU] via SUBCUTANEOUS
  Filled 2014-08-16 (×3): qty 1

## 2014-08-16 MED ORDER — SODIUM CHLORIDE 0.9 % IV SOLN
INTRAVENOUS | Status: DC
  Administered 2014-08-16 – 2014-08-18 (×4): via INTRAVENOUS

## 2014-08-16 MED ORDER — CALCIUM CARBONATE 600 MG PO TABS
600.0000 mg | ORAL_TABLET | Freq: Every day | ORAL | Status: DC
  Filled 2014-08-16: qty 1

## 2014-08-16 MED ORDER — ONDANSETRON HCL 4 MG/2ML IJ SOLN
4.0000 mg | Freq: Four times a day (QID) | INTRAMUSCULAR | Status: DC | PRN
  Filled 2014-08-16: qty 2

## 2014-08-16 MED ORDER — SODIUM CHLORIDE 0.9 % IJ SOLN
3.0000 mL | Freq: Two times a day (BID) | INTRAMUSCULAR | Status: DC
  Administered 2014-08-17 – 2014-08-19 (×2): 3 mL via INTRAVENOUS

## 2014-08-16 MED ORDER — DEXTROSE 5 % IV SOLN
1.0000 g | Freq: Once | INTRAVENOUS | Status: AC
  Administered 2014-08-16: 1 g via INTRAVENOUS
  Filled 2014-08-16: qty 10

## 2014-08-16 MED ORDER — PANTOPRAZOLE SODIUM 40 MG PO TBEC
40.0000 mg | DELAYED_RELEASE_TABLET | Freq: Every day | ORAL | Status: DC
  Administered 2014-08-17 – 2014-08-20 (×4): 40 mg via ORAL
  Filled 2014-08-16 (×5): qty 1

## 2014-08-16 NOTE — H&P (Signed)
[facility] History and Physical  [name] [id number] DOB: Oct 03, 1925 DOA: 08/16/2014  Referring physician: Emilie Rutter, MD PCP: Estill Dooms, MD   Chief Complaint: Renal failure  HPI: [name] is a 81 y.o. female with history of CKD HTN Hyperlipidemia GERD presents with acute renal failure and hyperkalemia. Patient resides at a skilled facility and had routine blood work drawn today. This revealed an elevated creatinine and also hyperkalemia. Patient has minimal to no complaints. She has no fevers or chills. She has no chest pain. No abdominal pain. She has no nausea or vomiting. She states that she does get recurrent UTI bladder infections. Patient states that she has no cough or congestion noted. She did have a UA done here and it is suggestive of a UTI. In addition she is on diuretics lasix and aldactone for a diagnosis of CHF in the past. She is on lisinopril for HTN history.   Review of Systems:  Complete 12 point ROS performed and is unremarkable other than HPI   Past Medical History  Diagnosis Date  . Senile osteoporosis   . Heart murmur   . Small bowel obstruction   . GERD (gastroesophageal reflux disease)   . Hypertension   . Glaucoma   . Arthritis     Osteoarthritis  . Personal history of fall   . Edema   . Hyperlipidemia   . Diverticulosis of colon (without mention of hemorrhage)   . Other malaise and fatigue   . Hypertonicity of bladder   . Osteoarthrosis, unspecified whether generalized or localized, unspecified site   . Lumbago   . Edema   . Pain in joint, pelvic region and thigh   . Pain in joint, shoulder region   . Flat feet   . Cerebral ischemia 12/19/2012    Microvascular   . Cerebral atrophy 12/19/2012  . Cerebral atherosclerosis 12/19/2012  . RLQ abdominal pain 01/17/2011   Past Surgical History  Procedure Laterality Date  . Tonsillectomy    . Colon surgery  02/1996    Colectomy, partial for diverticular bleed 90%  .  Joint replacement Left 12/2000    Left knee; Dr. Wynelle Link  . Breast lumpectomy Bilateral   . Laparoscopic lysis of adhesions  11/15/10    Exploratory   Social History:  reports that she quit smoking about 33 years ago. Her smoking use included Cigarettes. She has a 15 pack-year smoking history. She has never used smokeless tobacco. She reports that she drinks about 0.6 oz of alcohol per week. She reports that she does not use illicit drugs.  Allergies  Allergen Reactions  . Sulfa Antibiotics Itching    Family History  Problem Relation Age of Onset  . Cancer Father 100    colon     Prior to Admission medications   Medication Sig Start Date End Date Taking? Authorizing Provider  aspirin 81 MG tablet Take 81 mg by mouth daily.     Yes Historical Provider, MD  atorvastatin (LIPITOR) 20 MG tablet Take one tablet by mouth once daily for cholesterol 04/21/14  Yes Tiffany L Reed, DO  BETIMOL 0.5 % ophthalmic solution Place 1 drop into both eyes every morning.  09/23/10  Yes Historical Provider, MD  calcium carbonate (OS-CAL) 600 MG TABS Take 600 mg by mouth daily.   Yes Historical Provider, MD  carvedilol (COREG) 3.125 MG tablet Take one tablet by mouth twice daily with a meal 05/06/14  Yes Estill Dooms, MD  Cholecalciferol (VITAMIN  D3) 2000 UNITS TABS Take 2,000 Units by mouth daily.     Yes Historical Provider, MD  furosemide (LASIX) 20 MG tablet TAKE 1 TABLET BY MOUTH ONCE DAILY 12/27/13  Yes Estill Dooms, MD  KLOR-CON M10 10 MEQ tablet TAKE 1 TABLET BY MOUTH EVERY DAY 03/16/14  Yes Estill Dooms, MD  latanoprost (XALATAN) 0.005 % ophthalmic solution Place 1 drop into both eyes at bedtime.    Yes Historical Provider, MD  lisinopril (PRINIVIL,ZESTRIL) 5 MG tablet Take one tablet daily for blood pressure 03/16/14  Yes Historical Provider, MD  mirtazapine (REMERON) 7.5 MG tablet TAKE 1 TABLET (7.5 MG TOTAL) BY MOUTH AT BEDTIME. NIGHTLY FOR APPETITE 03/15/14  Yes Estill Dooms, MD  Multiple Vitamin  (MULTIVITAMIN) capsule Take 1 capsule by mouth daily. 2000 units by mouth daily   Yes Historical Provider, MD  Omega-3 Fatty Acids (FISH OIL) 1200 MG CAPS Take 1,200 mg by mouth 2 (two) times daily.     Yes Historical Provider, MD  omeprazole (PRILOSEC) 20 MG capsule Take 20 mg by mouth daily.   Yes Historical Provider, MD  pregabalin (LYRICA) 50 MG capsule Take 50 mg by mouth. Take one capsule daily   Yes Historical Provider, MD  spironolactone (ALDACTONE) 25 MG tablet Take 1 tablet (25 mg total) by mouth daily. 04/21/14  Yes Tiffany Lynelle Doctor, DO  traMADol Veatrice Bourbon) 50 MG tablet One every 6 hours if needed for pain 04/21/14  Yes Estill Dooms, MD  vitamin B-12 (CYANOCOBALAMIN) 1000 MCG tablet Take 1,000 mcg by mouth 2 (two) times daily. Take one tablet by mouth twice daily on Mondays,Wednesdays and Fridays   Yes Historical Provider, MD  esomeprazole (NEXIUM) 40 MG capsule TAKE 1 CAPSULE EVERY DAY. Patient not taking: Reported on 08/16/2014 05/26/14   Estill Dooms, MD   Physical Exam: Filed Vitals:   08/16/14 1630 08/16/14 1837  BP: 152/86 147/56  Pulse: 67 70  Temp: 97.5 F (36.4 C)   TempSrc: Oral   Resp: 18 14  SpO2: 93% 94%    Wt Readings from Last 3 Encounters:  04/21/14 57.153 kg (126 lb)  04/07/14 56.246 kg (124 lb)  02/03/14 54.432 kg (120 lb)    General:  Appears calm and comfortable Eyes: PERRL, normal lids, irises & conjunctiva ENT: grossly normal hearing, lips & tongue Neck: no LAD, masses or thyromegaly Cardiovascular: RRR, no m/r/g. No LE edema. Respiratory: CTA bilaterally, no w/r/r. Normal respiratory effort. Abdomen: soft, ntnd Skin: no rash or induration seen on limited exam Musculoskeletal: grossly normal tone BUE/BLE Psychiatric: grossly normal mood and affect Neurologic: grossly non-focal.          Labs on Admission:  Basic Metabolic Panel:  Recent Labs Lab 08/16/14 1641  NA 136  K 5.9*  CL 112*  CO2 17*  GLUCOSE 103*  BUN 93*  CREATININE 5.15*    CALCIUM 9.1   Liver Function Tests:  Recent Labs Lab 08/16/14 1641  AST 12*  ALT 8*  ALKPHOS 41  BILITOT 0.6  PROT 6.7  ALBUMIN 3.8   No results for input(s): LIPASE, AMYLASE in the last 168 hours. No results for input(s): AMMONIA in the last 168 hours. CBC:  Recent Labs Lab 08/16/14 1641  WBC 6.0  NEUTROABS 3.2  HGB 9.2*  HCT 28.0*  MCV 91.2  PLT 211   Cardiac Enzymes: No results for input(s): CKTOTAL, CKMB, CKMBINDEX, TROPONINI in the last 168 hours.  BNP (last 3 results) No results for input(s):  BNP in the last 8760 hours.  ProBNP (last 3 results) No results for input(s): PROBNP in the last 8760 hours.  CBG: No results for input(s): GLUCAP in the last 168 hours.  Radiological Exams on Admission: Dg Chest 2 View  08/16/2014   CLINICAL DATA:  81 year old female with a history of urinary tract infection.  EXAM: CHEST - 2 VIEW  COMPARISON:  02/10/2014, 07/18/2012, 11/24/2010  FINDINGS: Cardiomediastinal silhouette is unchanged. Atherosclerotic calcification of the aortic arch.  No evidence of pulmonary vascular congestion.  Similar appearance of right suprahilar opacity compared to the prior. No confluent airspace disease.  No pneumothorax or pleural effusion.  Stigmata of emphysema, with increased retrosternal airspace, flattened hemidiaphragms, increased AP diameter, and hyperinflation on the AP view.  Unchanged configuration of mid thoracic compression fracture without retropulsion of fracture fragments, and without significant progression of height loss.  Right-sided rib fractures appear at least partially healed.  IMPRESSION: No radiographic evidence of acute cardiopulmonary disease with a background of chronic changes and lung atelectasis.  Emphysema.  Atherosclerosis.  Remote fractures of right-sided ribs.  Signed,  Dulcy Fanny. Earleen Newport, DO  [facility]  [facility]   Electronically Signed   By: Corrie Mckusick D.O.   On:  08/16/2014 17:43      Assessment/Plan Principal Problem:   Acute renal failure superimposed on stage 3 chronic kidney disease Active Problems:   Hypertension   Hyperlipidemia   Anemia   Hyperkalemia   1. Acute on Chronic Renal failure with CKD III -will admit to telemetry -start on IVF NS at 50/hour -will hold Aldactone lasix and lisinopril  2. HTN -will monitor pressures -hold lisinopril due to Acute renal failure  3. Hyperlipidemia -continue with statins  4. Anemia -will check iron and folate and B12  5. Hyperkalemia -will hold potassium supplements -holding lisinopril -will give kayexalate one time dose now  6. UTI -cultures ordered -will start on Rocephin daily  7. CHF -will hold diuretics for now -monitor IOs closely     Code Status: Full Code (must indicate code status--if unknown or must be presumed, indicate so) DVT Prophylaxis:Heparin Family Communication: None (indicate person spoken with, if applicable, with phone number if by telephone) Disposition Plan: Home (indicate anticipated LOS)  Time spent: 48min  Yash Cacciola A [facility] Pager [phone]

## 2014-08-16 NOTE — ED Notes (Signed)
Pt. returned from XR. 

## 2014-08-16 NOTE — Clinical Social Work Note (Signed)
Clinical Social Work Assessment  Patient Details  Name: [name] MRN: [id number] Date of Birth: Sep 01, 1925  Date of referral:  08/16/14               Reason for consult:   (Patient is from [facility]                Permission sought to share information with:   (NONE.) Permission granted to share information::  No  Name::        Agency::     Relationship::     Contact Information:     Housing/Transportation Living arrangements for the past 2 months:  [place] of Information:  Patient, Adult Children Patient Interpreter Needed:  None Criminal Activity/Legal Involvement Pertinent to Current Situation/Hospitalization:  No - Comment as needed Significant Relationships:  Adult Children (Daughter informed CSW that she is the pt's primary support and POA.) Lives with:  Facility Resident Do you feel safe going back to the place where you live?  Yes Need for family participation in patient care:  Yes (Comment) (Daughter informed CSW that she visits the pt often.)  Care giving concerns:  Patient and daughter have not expressed any care giving concerns at this time. The pt is currently a resident at [facility].   Social Worker assessment / plan:  CSW met with patient at bedside. Daughter was present. Patient confirms that she presents to [facility] due to abnormal labs. Daughter informed CSW that the facility where the pt lives does routine blood checks, and determined that she needed to come to [facility] based on her results.  Patient informed CSW that she completes her ADL's with assistance. Patient and daughter inform CSW that she has fallen x8 in the past 6 months.  Daughter informed CSW that she is the pt's only support. She states that she lives in [place] and visits the pt often. Also, she informed CSW that she is the pt's  POA and that she visits her often at the facility.  Employment status:  Retired Forensic scientist:   Nurse, mental health) PT  Recommendations:  Not assessed at this time Information / Referral to community resources:   (Patient is currently living at [facility].)  Patient/Family's Response to care:  Patient and daughter are aware that she will be admitted. Their response is appropriate and accepting at this time.  Patient/Family's Understanding of and Emotional Response to Diagnosis, Current Treatment, and Prognosis:  Patient and daughter expressed understanding knowledge about admission and diagnosis.  Emotional Assessment Appearance:  Appears stated age Attitude/Demeanor/Rapport:   (Well Mannered.) Affect (typically observed):  Accepting, Appropriate Orientation:  Oriented to Self, Oriented to Place, Oriented to  Time, Oriented to Situation Alcohol / Substance use:   (Patient states that she is a past smoker. She states that she  occasionally drinks white wine.) Psych involvement (Current and /or in the community):  No (Comment)  Discharge Needs  Concerns to be addressed:  Adjustment to Illness Readmission within the last 30 days:  No Current discharge risk:  None Barriers to Discharge:  No Barriers Identified   Bernita Buffy, LCSW 08/16/2014, 10:05 PM

## 2014-08-16 NOTE — ED Notes (Signed)
Per EMS pt has elevated BUN, Crt, and K+. From [facility]. Increased difficulty with holding bladder and urine has a strong odor to it over the last two weeks. Also complaining of a cough and dark loose stools on-going for two weeks.

## 2014-08-16 NOTE — Progress Notes (Signed)
ANTIBIOTIC CONSULT NOTE - INITIAL  Pharmacy Consult for Ceftriaxone Indication: UTI  Allergies  Allergen Reactions  . Sulfa Antibiotics Itching    Patient Measurements: Height: 5\' 6"  (167.6 cm) Weight: 131 lb 6.4 oz (59.603 kg) IBW/kg (Calculated) : 59.3   Vital Signs: Temp: 98.1 F (36.7 C) (06/28 1947) Temp Source: Oral (06/28 1947) BP: 170/60 mmHg (06/28 1947) Pulse Rate: 75 (06/28 1947) Intake/Output from previous day:   Intake/Output from this shift:    Labs:  Recent Labs  08/16/14 1641  WBC 6.0  HGB 9.2*  PLT 211  CREATININE 5.15*   Estimated Creatinine Clearance: 7.2 mL/min (by C-G formula based on Cr of 5.15). No results for input(s): VANCOTROUGH, VANCOPEAK, VANCORANDOM, GENTTROUGH, GENTPEAK, GENTRANDOM, TOBRATROUGH, TOBRAPEAK, TOBRARND, AMIKACINPEAK, AMIKACINTROU, AMIKACIN in the last 72 hours.   Microbiology: No results found for this or any previous visit (from the past 720 hour(s)).  Medical History: Past Medical History  Diagnosis Date  . Senile osteoporosis   . Heart murmur   . Small bowel obstruction   . GERD (gastroesophageal reflux disease)   . Hypertension   . Glaucoma   . Arthritis     Osteoarthritis  . Personal history of fall   . Edema   . Hyperlipidemia   . Diverticulosis of colon (without mention of hemorrhage)   . Other malaise and fatigue   . Hypertonicity of bladder   . Osteoarthrosis, unspecified whether generalized or localized, unspecified site   . Lumbago   . Edema   . Pain in joint, pelvic region and thigh   . Pain in joint, shoulder region   . Flat feet   . Cerebral ischemia 12/19/2012    Microvascular   . Cerebral atrophy 12/19/2012  . Cerebral atherosclerosis 12/19/2012  . RLQ abdominal pain 01/17/2011     Assessment: 83 yoF presents with hyperkalemia, AKI, and UTI.  Pharmacy consulted to dose Ceftriaxone 1g IV q24h.  Urine cultures collected.  Goal of Therapy:  Eradication of infection  Plan:  Ceftriaxone  1g IV q24h. No further dose adjustments necessary. Pharmacy will sign off.  Hershal Coria 08/16/2014,8:10 PM

## 2014-08-16 NOTE — ED Provider Notes (Signed)
CSN: [id number]     Arrival date & time 08/16/14  1620 History   First MD Initiated Contact with Patient 08/16/14 1628     Chief Complaint  Patient presents with  . Abnormal labs   . Urinary Frequency  . Cough  . Melena     (Consider location/radiation/quality/duration/timing/severity/associated sxs/prior Treatment) HPI Patient states that she has had minimal symptoms. From her perspective mostly she has urinary urgency. She reports she has had urinary tract infection the past. She denies however she's had nausea or vomiting. She denies loss of appetite. She denies diarrhea or constipation. He denies abdominal pain or fever. Her daughter reports they do routine lab testing at her assisted living, and they identify significant changes in her labs and sent her to the emergency department. Past Medical History  Diagnosis Date  . Senile osteoporosis   . Heart murmur   . Small bowel obstruction   . GERD (gastroesophageal reflux disease)   . Hypertension   . Glaucoma   . Arthritis     Osteoarthritis  . Personal history of fall   . Edema   . Hyperlipidemia   . Diverticulosis of colon (without mention of hemorrhage)   . Other malaise and fatigue   . Hypertonicity of bladder   . Osteoarthrosis, unspecified whether generalized or localized, unspecified site   . Lumbago   . Edema   . Pain in joint, pelvic region and thigh   . Pain in joint, shoulder region   . Flat feet   . Cerebral ischemia 12/19/2012    Microvascular   . Cerebral atrophy 12/19/2012  . Cerebral atherosclerosis 12/19/2012  . RLQ abdominal pain 01/17/2011   Past Surgical History  Procedure Laterality Date  . Tonsillectomy    . Colon surgery  02/1996    Colectomy, partial for diverticular bleed 90%  . Joint replacement Left 12/2000    Left knee; Dr. Wynelle Link  . Breast lumpectomy Bilateral   . Laparoscopic lysis of adhesions  11/15/10    Exploratory   Family History  Problem Relation Age of Onset  . Cancer Father  47    colon   History  Substance Use Topics  . Smoking status: Former Smoker -- 0.50 packs/day for 30 years    Types: Cigarettes    Quit date: 12/16/1980  . Smokeless tobacco: Never Used  . Alcohol Use: 0.6 oz/week    1 Glasses of wine per week     Comment: occasional glass of wine   OB History    No data available     Review of Systems  10 Systems reviewed and are negative for acute change except as noted in the HPI.   Allergies  Sulfa antibiotics  Home Medications   Prior to Admission medications   Medication Sig Start Date End Date Taking? Authorizing Provider  aspirin 81 MG tablet Take 81 mg by mouth daily.     Yes Historical Provider, MD  atorvastatin (LIPITOR) 20 MG tablet Take one tablet by mouth once daily for cholesterol 04/21/14  Yes Tiffany L Reed, DO  BETIMOL 0.5 % ophthalmic solution Place 1 drop into both eyes every morning.  09/23/10  Yes Historical Provider, MD  calcium carbonate (OS-CAL) 600 MG TABS Take 600 mg by mouth daily.   Yes Historical Provider, MD  carvedilol (COREG) 3.125 MG tablet Take one tablet by mouth twice daily with a meal 05/06/14  Yes Estill Dooms, MD  Cholecalciferol (VITAMIN D3) 2000 UNITS TABS Take 2,000 Units by  mouth daily.     Yes Historical Provider, MD  furosemide (LASIX) 20 MG tablet TAKE 1 TABLET BY MOUTH ONCE DAILY 12/27/13  Yes Estill Dooms, MD  KLOR-CON M10 10 MEQ tablet TAKE 1 TABLET BY MOUTH EVERY DAY 03/16/14  Yes Estill Dooms, MD  latanoprost (XALATAN) 0.005 % ophthalmic solution Place 1 drop into both eyes at bedtime.    Yes Historical Provider, MD  lisinopril (PRINIVIL,ZESTRIL) 5 MG tablet Take one tablet daily for blood pressure 03/16/14  Yes Historical Provider, MD  mirtazapine (REMERON) 7.5 MG tablet TAKE 1 TABLET (7.5 MG TOTAL) BY MOUTH AT BEDTIME. NIGHTLY FOR APPETITE 03/15/14  Yes Estill Dooms, MD  Multiple Vitamin (MULTIVITAMIN) capsule Take 1 capsule by mouth daily. 2000 units by mouth daily   Yes Historical  Provider, MD  Omega-3 Fatty Acids (FISH OIL) 1200 MG CAPS Take 1,200 mg by mouth 2 (two) times daily.     Yes Historical Provider, MD  omeprazole (PRILOSEC) 20 MG capsule Take 20 mg by mouth daily.   Yes Historical Provider, MD  pregabalin (LYRICA) 50 MG capsule Take 50 mg by mouth. Take one capsule daily   Yes Historical Provider, MD  spironolactone (ALDACTONE) 25 MG tablet Take 1 tablet (25 mg total) by mouth daily. 04/21/14  Yes Tiffany Lynelle Doctor, DO  traMADol Veatrice Bourbon) 50 MG tablet One every 6 hours if needed for pain 04/21/14  Yes Estill Dooms, MD  vitamin B-12 (CYANOCOBALAMIN) 1000 MCG tablet Take 1,000 mcg by mouth 2 (two) times daily. Take one tablet by mouth twice daily on Mondays,Wednesdays and Fridays   Yes Historical Provider, MD  esomeprazole (NEXIUM) 40 MG capsule TAKE 1 CAPSULE EVERY DAY. Patient not taking: Reported on 08/16/2014 05/26/14   Estill Dooms, MD   BP 152/86 mmHg  Pulse 67  Temp(Src) 97.5 F (36.4 C) (Oral)  Resp 18  SpO2 93% Physical Exam  Constitutional: She is oriented to person, place, and time. She appears well-developed and well-nourished.  HENT:  Head: Normocephalic and atraumatic.  Eyes: EOM are normal. Pupils are equal, round, and reactive to light.  Neck: Neck supple.  Cardiovascular: Normal rate, regular rhythm and intact distal pulses.   3/6 systolic ejection murmur.  Pulmonary/Chest: Effort normal and breath sounds normal.  Abdominal: Soft. Bowel sounds are normal. She exhibits no distension. There is no tenderness.  Musculoskeletal: Normal range of motion. She exhibits no edema.  Neurological: She is alert and oriented to person, place, and time. She has normal strength. Coordination normal. GCS eye subscore is 4. GCS verbal subscore is 5. GCS motor subscore is 6.  Skin: Skin is warm, dry and intact.  Psychiatric: She has a normal mood and affect.    ED Course  Procedures (including critical care time) Labs Review Labs Reviewed  COMPREHENSIVE  METABOLIC PANEL - Abnormal; Notable for the following:    Potassium 5.9 (*)    Chloride 112 (*)    CO2 17 (*)    Glucose, Bld 103 (*)    BUN 93 (*)    Creatinine, Ser 5.15 (*)    AST 12 (*)    ALT 8 (*)    GFR calc non Af Amer 7 (*)    GFR calc Af Amer 8 (*)    All other components within normal limits  CBC WITH DIFFERENTIAL/PLATELET - Abnormal; Notable for the following:    RBC 3.07 (*)    Hemoglobin 9.2 (*)    HCT 28.0 (*)  Monocytes Relative 13 (*)    Eosinophils Relative 6 (*)    All other components within normal limits  URINALYSIS, ROUTINE W REFLEX MICROSCOPIC (NOT AT [facility]) - Abnormal; Notable for the following:    APPearance CLOUDY (*)    Leukocytes, UA LARGE (*)    All other components within normal limits  URINE MICROSCOPIC-ADD ON - Abnormal; Notable for the following:    Bacteria, UA MANY (*)    All other components within normal limits  URINE CULTURE  URINE CULTURE    Imaging Review Dg Chest 2 View  08/16/2014   CLINICAL DATA:  81 year old female with a history of urinary tract infection.  EXAM: CHEST - 2 VIEW  COMPARISON:  02/10/2014, 07/18/2012, 11/24/2010  FINDINGS: Cardiomediastinal silhouette is unchanged. Atherosclerotic calcification of the aortic arch.  No evidence of pulmonary vascular congestion.  Similar appearance of right suprahilar opacity compared to the prior. No confluent airspace disease.  No pneumothorax or pleural effusion.  Stigmata of emphysema, with increased retrosternal airspace, flattened hemidiaphragms, increased AP diameter, and hyperinflation on the AP view.  Unchanged configuration of mid thoracic compression fracture without retropulsion of fracture fragments, and without significant progression of height loss.  Right-sided rib fractures appear at least partially healed.  IMPRESSION: No radiographic evidence of acute cardiopulmonary disease with a background of chronic changes and lung atelectasis.  Emphysema.  Atherosclerosis.  Remote  fractures of right-sided ribs.  Signed,  Dulcy Fanny. Earleen Newport, DO  [facility]  [facility]   Electronically Signed   By: Corrie Mckusick D.O.   On: 08/16/2014 17:43     EKG Interpretation   Date/Time:  Tuesday August 16 2014 16:43:50 EDT Ventricular Rate:  69 PR Interval:  178 QRS Duration: 89 QT Interval:  400 QTC Calculation: 428 R Axis:   69 Text Interpretation:  Sinus rhythm Consider left atrial enlargement  Nonspecific T abnormalities, lateral leads Confirmed by Johnney Killian, MD,  Jeannie Done [phone]) on 08/16/2014 6:26:44 PM     CRITICAL CARE Performed by: Charlesetta Shanks   Total critical care time:45  Critical care time was exclusive of separately billable procedures and treating other patients.  Critical care was necessary to treat or prevent imminent or life-threatening deterioration.  Critical care was time spent personally by me on the following activities: development of treatment plan with patient and/or surrogate as well as nursing, discussions with consultants, evaluation of patient's response to treatment, examination of patient, obtaining history from patient or surrogate, ordering and performing treatments and interventions, ordering and review of laboratory studies, ordering and review of radiographic studies, pulse oximetry and re-evaluation of patient's condition. MDM   Final diagnoses:  Acute renal failure, unspecified acute renal failure type  UTI (lower urinary tract infection)  Hyperkalemia   Patient presents as outlined above with hyperkalemia and acute renal failure. Clinically the patient has clear mental status, no respiratory distress and is nontoxic. EKG does show peaking of the T waves. Patient also has UTI. Treatment for hyperkalemia initiated and for UTI.    Charlesetta Shanks, MD 08/16/14 [phone]

## 2014-08-16 NOTE — Progress Notes (Signed)
Utilization Review completed.  Emanuel Dowson RN CM  

## 2014-08-16 NOTE — ED Notes (Signed)
Bed: WA01 Expected date:  Expected time:  Means of arrival:  Comments: EMS-abnormal labs

## 2014-08-16 NOTE — ED Notes (Signed)
Patient transported to X-ray 

## 2014-08-17 ENCOUNTER — Inpatient Hospital Stay (HOSPITAL_COMMUNITY): Payer: Medicare Other

## 2014-08-17 DIAGNOSIS — N390 Urinary tract infection, site not specified: Secondary | ICD-10-CM | POA: Insufficient documentation

## 2014-08-17 DIAGNOSIS — D649 Anemia, unspecified: Secondary | ICD-10-CM

## 2014-08-17 LAB — COMPREHENSIVE METABOLIC PANEL
ALBUMIN: 3.2 g/dL — AB (ref 3.5–5.0)
ALK PHOS: 34 U/L — AB (ref 38–126)
ALT: 8 U/L — ABNORMAL LOW (ref 14–54)
AST: 11 U/L — AB (ref 15–41)
Anion gap: 7 (ref 5–15)
BUN: 86 mg/dL — ABNORMAL HIGH (ref 6–20)
CALCIUM: 8.6 mg/dL — AB (ref 8.9–10.3)
CO2: 17 mmol/L — ABNORMAL LOW (ref 22–32)
Chloride: 118 mmol/L — ABNORMAL HIGH (ref 101–111)
Creatinine, Ser: 4.45 mg/dL — ABNORMAL HIGH (ref 0.44–1.00)
GFR calc Af Amer: 9 mL/min — ABNORMAL LOW (ref >60)
GFR calc non Af Amer: 8 mL/min — ABNORMAL LOW (ref >60)
Glucose, Bld: 89 mg/dL (ref 65–99)
Potassium: 5.3 mmol/L — ABNORMAL HIGH (ref 3.5–5.1)
Sodium: 142 mmol/L (ref 135–145)
Total Bilirubin: 0.5 mg/dL (ref 0.3–1.2)
Total Protein: 5.9 g/dL — ABNORMAL LOW (ref 6.5–8.1)

## 2014-08-17 LAB — IRON AND TIBC
IRON: 89 ug/dL (ref 28–170)
Saturation Ratios: 30 % (ref 10.4–31.8)
TIBC: 297 ug/dL (ref 250–450)
UIBC: 208 ug/dL

## 2014-08-17 LAB — CBC
HEMATOCRIT: 25.9 % — AB (ref 36.0–46.0)
Hemoglobin: 8.5 g/dL — ABNORMAL LOW (ref 12.0–15.0)
MCH: 29.8 pg (ref 26.0–34.0)
MCHC: 32.8 g/dL (ref 30.0–36.0)
MCV: 90.9 fL (ref 78.0–100.0)
Platelets: 186 10*3/uL (ref 150–400)
RBC: 2.85 MIL/uL — ABNORMAL LOW (ref 3.87–5.11)
RDW: 15 % (ref 11.5–15.5)
WBC: 5.9 10*3/uL (ref 4.0–10.5)

## 2014-08-17 LAB — PROTIME-INR
INR: 1.23 (ref 0.00–1.49)
Prothrombin Time: 15.6 seconds — ABNORMAL HIGH (ref 11.6–15.2)

## 2014-08-17 LAB — OCCULT BLOOD X 1 CARD TO LAB, STOOL: Fecal Occult Bld: POSITIVE — AB

## 2014-08-17 LAB — GLUCOSE, CAPILLARY: GLUCOSE-CAPILLARY: 85 mg/dL (ref 65–99)

## 2014-08-17 LAB — FERRITIN: Ferritin: 76 ng/mL (ref 11–307)

## 2014-08-17 LAB — VITAMIN B12: VITAMIN B 12: 1080 pg/mL — AB (ref 180–914)

## 2014-08-17 MED ORDER — CALCIUM CARBONATE 1250 (500 CA) MG PO TABS
1250.0000 mg | ORAL_TABLET | Freq: Every day | ORAL | Status: DC
  Administered 2014-08-17 – 2014-08-20 (×4): 1250 mg via ORAL
  Filled 2014-08-17 (×5): qty 1

## 2014-08-17 MED ORDER — SODIUM POLYSTYRENE SULFONATE 15 GM/60ML PO SUSP
30.0000 g | Freq: Once | ORAL | Status: AC
  Administered 2014-08-17: 30 g via ORAL
  Filled 2014-08-17: qty 120

## 2014-08-17 NOTE — Progress Notes (Addendum)
[facility] PROGRESS NOTE  [name] [id number] DOB: [birth year] DOA: 08/16/2014 PCP: Estill Dooms, MD  Brief narrative 81 year old female with history of chronic kidney disease stage III, hypertension, hyperlipidemia and GERD was sent from skilled nursing facility with acute kidney injury and hyperkalemia on routine blood work. Patient admitted to telemetry. She was also found to have UTI and started on empiric antibiotic.  Assessment/Plan: Acute and chronic kidney disease stage III Likely a combination of nephrotoxicity due to underlying medications (Lasix, ACE inhibitor and Aldactone) and UTI. -Discontinued all offending medications.  -Monitor with gentle IV hydration. Monitor strict I/O. Monitor daily BMET . Check renal ultrasound to rule out any obstructions.  UTI Continue empiric Rocephin. Follow urine culture  Hyperkalemia Stable on telemetry. Will order another dose of Kayexalate. Discontinued ACE inhibitor and Aldactone. Also on potassium supplements which has been held.  Hyperlipidemia Continue statin.  History of diastolic CHF Currently euvolemic. Monitor with gentle hydration. Continue Lasix, lisinopril and Aldactone. Continue aspirin, statin and Coreg.  Anemia  Baseline hemoglobin of 12 about 4 months back. Patient complains of passing dark stools. Iron panel suggestive of anemia of chronic disease. B12 normal. Check stool for occult blood.   Diet: 2 g sodium  DVT prophylaxis: SCD   Code Status: Full code  Family Communication: None at bedside  Disposition Plan:Return to skilled nursing facility possibly in 48 hours none function improves.  Consultants: None   Procedures: Renal ultrasound   Antibiotics: IV Rocephin 6/28--  HPI/Subjective: Patient seen and examined. Denies any abdominal pain, nausea or vomiting. Reports some dysuria. Also reports passing dark stool for past several weeks.   Objective: Filed Vitals:   08/17/14 0517  BP:  127/50  Pulse: 72  Temp: 98 F (36.7 C)  Resp: 17    Intake/Output Summary (Last 24 hours) at 08/17/14 1129 Last data filed at 08/17/14 [id number]  Gross per 24 hour  Intake  837.5 ml  Output    700 ml  Net  137.5 ml   Filed Weights   08/16/14 1947  Weight: 59.603 kg (131 lb 6.4 oz)    Exam:   General:  Elderly female in no acute distress  HEENT: No pallor, moist oral mucosa, supple neck  Chest: Clear to auscultation bilaterally, no added sounds  CVS: Normal S1 and S2, systolic murmur 4/6  GI: Soft, nondistended, nontender, bowel sounds present  Musculoskeletal: Warm, no edema  CNS: Alert and oriented   Data Reviewed: Basic Metabolic Panel:  Recent Labs Lab 08/16/14 1641 08/16/14 2217 08/17/14 0410  NA 136  --  142  K 5.9*  --  5.3*  CL 112*  --  118*  CO2 17*  --  17*  GLUCOSE 103*  --  89  BUN 93*  --  86*  CREATININE 5.15* 4.79* 4.45*  CALCIUM 9.1  --  8.6*   Liver Function Tests:  Recent Labs Lab 08/16/14 1641 08/17/14 0410  AST 12* 11*  ALT 8* 8*  ALKPHOS 41 34*  BILITOT 0.6 0.5  PROT 6.7 5.9*  ALBUMIN 3.8 3.2*   No results for input(s): LIPASE, AMYLASE in the last 168 hours. No results for input(s): AMMONIA in the last 168 hours. CBC:  Recent Labs Lab 08/16/14 1641 08/16/14 2217 08/17/14 0410  WBC 6.0 5.4 5.9  NEUTROABS 3.2  --   --   HGB 9.2* 9.3* 8.5*  HCT 28.0* 27.5* 25.9*  MCV 91.2 90.8 90.9  PLT 211 209 186   Cardiac Enzymes:  No results for input(s): CKTOTAL, CKMB, CKMBINDEX, TROPONINI in the last 168 hours. BNP (last 3 results) No results for input(s): BNP in the last 8760 hours.  ProBNP (last 3 results) No results for input(s): PROBNP in the last 8760 hours.  CBG:  Recent Labs Lab 08/17/14 0812  GLUCAP 85    No results found for this or any previous visit (from the past 240 hour(s)).   Studies: Dg Chest 2 View  08/16/2014   CLINICAL DATA:  81 year old female with a history of urinary tract infection.  EXAM:  CHEST - 2 VIEW  COMPARISON:  02/10/2014, 07/18/2012, 11/24/2010  FINDINGS: Cardiomediastinal silhouette is unchanged. Atherosclerotic calcification of the aortic arch.  No evidence of pulmonary vascular congestion.  Similar appearance of right suprahilar opacity compared to the prior. No confluent airspace disease.  No pneumothorax or pleural effusion.  Stigmata of emphysema, with increased retrosternal airspace, flattened hemidiaphragms, increased AP diameter, and hyperinflation on the AP view.  Unchanged configuration of mid thoracic compression fracture without retropulsion of fracture fragments, and without significant progression of height loss.  Right-sided rib fractures appear at least partially healed.  IMPRESSION: No radiographic evidence of acute cardiopulmonary disease with a background of chronic changes and lung atelectasis.  Emphysema.  Atherosclerosis.  Remote fractures of right-sided ribs.  Signed,  Dulcy Fanny. Earleen Newport, DO  [facility]  [facility]   Electronically Signed   By: Corrie Mckusick D.O.   On: 08/16/2014 17:43    Scheduled Meds: . atorvastatin  20 mg Oral q1800  . calcium carbonate  1,250 mg Oral Q breakfast  . carvedilol  3.125 mg Oral BID WC  . cefTRIAXone (ROCEPHIN)  IV  1 g Intravenous Q24H  . cholecalciferol  2,000 Units Oral Daily  . folic acid  1 mg Oral Daily  . heparin  5,000 Units Subcutaneous 3 times per day  . latanoprost  1 drop Both Eyes QHS  . mirtazapine  7.5 mg Oral QHS  . multivitamin with minerals  1 tablet Oral Daily  . omega-3 acid ethyl esters  1 g Oral BID  . pantoprazole  40 mg Oral Daily  . pregabalin  50 mg Oral Daily  . sodium chloride  3 mL Intravenous Q12H  . thiamine  100 mg Oral Daily  . timolol  1 drop Both Eyes Daily  . vitamin B-12  1,000 mcg Oral 2 times per day on Mon Wed Fri   Continuous Infusions: . sodium chloride 75 mL/hr at 08/17/14 0405      Time spent: 25 minutes    Julia Kulzer,  Mahdiya Mossberg  [facility] Pager [phone] If 7PM-7AM, please contact night-coverage at [email or web address], password [facility] 08/17/2014, 11:29 AM  LOS: 1 day

## 2014-08-17 NOTE — Evaluation (Addendum)
Physical Therapy Evaluation Patient Details Name: [name] MRN: [id number] DOB: [birth year] Today's Date: 08/17/2014   History of Present Illness   81 yo female admitted with acute renal failure, abnormal labs. Pt is from ALF.   Clinical Impression  On eval, pt required Min guard assist for mobility-able to ambulate ~45 feet with rollator. Pt tolerated session fairly well. Recommend HHPT at ALF.     Follow Up Recommendations Home health PT (at ALF)    Equipment Recommendations  None recommended by PT    Recommendations for Other Services       Precautions / Restrictions        Mobility  Bed Mobility Overal bed mobility: Needs Assistance Bed Mobility: Supine to Sit     Supine to sit: Min guard     General bed mobility comments: for safety  Transfers Overall transfer level: Needs assistance Equipment used: 4-wheeled walker Transfers: Sit to/from Stand Sit to Stand: Min guard         General transfer comment: close guard for safety  Ambulation/Gait Ambulation/Gait assistance: Min guard Ambulation Distance (Feet): 45 Feet Assistive device: 4-wheeled walker Gait Pattern/deviations: Step-through pattern;Trunk flexed;Decreased stride length     General Gait Details: close guard for safety. fatigues fairly quickly. Pt reported she couldn't walk too far because of discomfort in feet.   Stairs            Wheelchair Mobility    Modified Rankin (Stroke Patients Only)       Balance                                             Pertinent Vitals/Pain Pain Assessment: No/denies pain    Home Living Family/patient expects to be discharged to:: Assisted living               Home Equipment: Walker - 4 wheels      Prior Function Level of Independence: Needs assistance   Gait / Transfers Assistance Needed: uses walker. walks to dining room.   ADL's / Homemaking Assistance Needed: assist with bathing, housework.          Hand Dominance        Extremity/Trunk Assessment   Upper Extremity Assessment: Generalized weakness           Lower Extremity Assessment: Generalized weakness      Cervical / Trunk Assessment: Kyphotic  Communication   Communication: HOH  Cognition Arousal/Alertness: Awake/alert Behavior During Therapy: WFL for tasks assessed/performed Overall Cognitive Status: Within Functional Limits for tasks assessed                      General Comments      Exercises        Assessment/Plan    PT Assessment Patient needs continued PT services  PT Diagnosis Difficulty walking;Generalized weakness   PT Problem List Decreased strength;Decreased activity tolerance;Decreased balance;Decreased mobility;Decreased knowledge of use of DME;Pain  PT Treatment Interventions DME instruction;Gait training;Functional mobility training;Therapeutic activities;Therapeutic exercise;Patient/family education;Balance training   PT Goals (Current goals can be found in the Care Plan section) Acute Rehab PT Goals Patient Stated Goal: home soon PT Goal Formulation: With patient Time For Goal Achievement: 08/31/14 Potential to Achieve Goals: Good    Frequency Min 3X/week   Barriers to discharge        Co-evaluation  End of Session Equipment Utilized During Treatment: Gait belt Activity Tolerance: Patient tolerated treatment well Patient left: in chair;with call bell/phone within reach           Time: [id number] PT Time Calculation (min) (ACUTE ONLY): 24 min   Charges:   PT Evaluation $Initial PT Evaluation Tier I: 1 Procedure PT Treatments $Gait Training: 8-22 mins   PT G Codes:        Weston Anna, MPT Pager: [phone]

## 2014-08-18 DIAGNOSIS — D638 Anemia in other chronic diseases classified elsewhere: Secondary | ICD-10-CM

## 2014-08-18 LAB — GLUCOSE, CAPILLARY: Glucose-Capillary: 79 mg/dL (ref 65–99)

## 2014-08-18 LAB — FOLATE RBC
FOLATE, RBC: 2133 ng/mL (ref >498)
Folate, Hemolysate: 588.6 ng/mL
Hematocrit: 27.6 % — ABNORMAL LOW (ref 34.0–46.6)

## 2014-08-18 LAB — HEMOGLOBIN A1C
HEMOGLOBIN A1C: 6.1 % — AB (ref 4.8–5.6)
MEAN PLASMA GLUCOSE: 128 mg/dL

## 2014-08-18 LAB — BASIC METABOLIC PANEL
ANION GAP: 8 (ref 5–15)
BUN: 59 mg/dL — AB (ref 6–20)
CALCIUM: 8.6 mg/dL — AB (ref 8.9–10.3)
CHLORIDE: 118 mmol/L — AB (ref 101–111)
CO2: 15 mmol/L — ABNORMAL LOW (ref 22–32)
CREATININE: 3.56 mg/dL — AB (ref 0.44–1.00)
GFR calc non Af Amer: 11 mL/min — ABNORMAL LOW (ref >60)
GFR, EST AFRICAN AMERICAN: 12 mL/min — AB (ref >60)
Glucose, Bld: 88 mg/dL (ref 65–99)
Potassium: 4.2 mmol/L (ref 3.5–5.1)
SODIUM: 141 mmol/L (ref 135–145)

## 2014-08-18 LAB — CBC
HCT: 25.6 % — ABNORMAL LOW (ref 36.0–46.0)
Hemoglobin: 8.2 g/dL — ABNORMAL LOW (ref 12.0–15.0)
MCH: 29.3 pg (ref 26.0–34.0)
MCHC: 32 g/dL (ref 30.0–36.0)
MCV: 91.4 fL (ref 78.0–100.0)
PLATELETS: 186 10*3/uL (ref 150–400)
RBC: 2.8 MIL/uL — AB (ref 3.87–5.11)
RDW: 14.9 % (ref 11.5–15.5)
WBC: 5.1 10*3/uL (ref 4.0–10.5)

## 2014-08-18 LAB — URINE CULTURE: Culture: >=100000

## 2014-08-18 MED ORDER — SODIUM BICARBONATE 650 MG PO TABS
650.0000 mg | ORAL_TABLET | Freq: Two times a day (BID) | ORAL | Status: DC
  Administered 2014-08-18 – 2014-08-20 (×5): 650 mg via ORAL
  Filled 2014-08-18 (×5): qty 1

## 2014-08-18 NOTE — Progress Notes (Addendum)
[facility] PROGRESS NOTE  [name] [id number] DOB: Jun 25, 1925 DOA: 08/16/2014 PCP: Estill Dooms, MD  Brief narrative 81 year old female with history of chronic kidney disease stage III, hypertension, hyperlipidemia and GERD was sent from skilled nursing facility with acute kidney injury and hyperkalemia on routine blood work. Patient admitted to telemetry. She was also found to have UTI and started on empiric antibiotic.  Assessment/Plan: Acute and chronic kidney disease stage III Likely a combination of nephrotoxicity due to underlying medications (Lasix, ACE inhibitor and Aldactone) and UTI. -Discontinued all offending medications.  -renal fn slowly improving. Renal  US shows medical renal ds. Monitor with gentle IV hydration.  strict I/O and daily BMET .   E coli UTI Pansensitive.  Continue empiric Rocephin.   Hyperkalemia Stable on telemetry. Resolved with kayexalate. . Discontinued ACE inhibitor and Aldactone. potassium supplements also  held.  Hyperlipidemia Continue statin.  History of diastolic CHF Currently euvolemic. Monitor with gentle hydration. Holding  Lasix, lisinopril and Aldactone. Continue aspirin, statin and Coreg.  Anemia  Baseline hemoglobin of 12 about 4 months back. ( was 9-10 1 year back)  Patient complains of passing dark stools. Iron panel suggestive of anemia of chronic disease. B12 normal. Check stool for occult blood positive. . patient  not interested in any intervention at this time. Swill need h&H monitoring as outpt. No hx of weight loss. Daughter agrees with holding further w/up at this time.   Low bircabonate  added supplements  Acute encephaloapthy Pt reportedly more confused today disoriented to place as per daughter. Will monitor . Avoid narcotics or benzos.   Diet: 2 g sodium  DVT prophylaxis: SCD   Code Status: Full code  Family Communication: Spoke with daughter on the phone Disposition Plan:Return to skilled  nursing facility possibly tomorrow if renal unction improves.  Consultants: None   Procedures: Renal ultrasound   Antibiotics: IV Rocephin 6/28--  HPI/Subjective: Patient seen and examined. Denies any abdominal pain, nausea or vomiting. Denies dysuria. Denies passing dark stools today.  Objective: Filed Vitals:   08/18/14 0506  BP: 144/59  Pulse: 63  Temp: 98 F (36.7 C)  Resp: 17    Intake/Output Summary (Last 24 hours) at 08/18/14 1238 Last data filed at 08/18/14 1123  Gross per 24 hour  Intake 1626.25 ml  Output    200 ml  Net 1426.25 ml   Filed Weights   08/16/14 1947  Weight: 59.603 kg (131 lb 6.4 oz)    Exam:   General:  Elderly female in no acute distress  HEENT: pallor+, moist oral mucosa, supple neck  Chest: Clear to auscultation bilaterally, no added sounds  CVS: Normal S1 and S2, systolic murmur 3/6  GI: Soft, nondistended, nontender, bowel sounds present  Musculoskeletal: Warm, no edema  CNS: Alert and oriented x2  Data Reviewed: Basic Metabolic Panel:  Recent Labs Lab 08/16/14 08/16/14 1641 08/16/14 2217 08/17/14 0410 08/18/14 0410  NA 138 136  --  142 141  K 6.0* 5.9*  --  5.3* 4.2  CL  --  112*  --  118* 118*  CO2  --  17*  --  17* 15*  GLUCOSE  --  103*  --  89 88  BUN 84* 93*  --  86* 59*  CREATININE 4.9* 5.15* 4.79* 4.45* 3.56*  CALCIUM  --  9.1  --  8.6* 8.6*   Liver Function Tests:  Recent Labs Lab 08/16/14 08/16/14 1641 08/17/14 0410  AST 10* 12* 11*  ALT  8 8* 8*  ALKPHOS 34 41 34*  BILITOT  --  0.6 0.5  PROT  --  6.7 5.9*  ALBUMIN  --  3.8 3.2*   No results for input(s): LIPASE, AMYLASE in the last 168 hours. No results for input(s): AMMONIA in the last 168 hours. CBC:  Recent Labs Lab 08/16/14 1641 08/16/14 2217 08/17/14 0410 08/18/14 0410  WBC 6.0 5.4 5.9 5.1  NEUTROABS 3.2  --   --   --   HGB 9.2* 9.3* 8.5* 8.2*  HCT 28.0* 27.5* 25.9* 25.6*  MCV 91.2 90.8 90.9 91.4  PLT 211 209 186 186    Cardiac Enzymes: No results for input(s): CKTOTAL, CKMB, CKMBINDEX, TROPONINI in the last 168 hours. BNP (last 3 results) No results for input(s): BNP in the last 8760 hours.  ProBNP (last 3 results) No results for input(s): PROBNP in the last 8760 hours.  CBG:  Recent Labs Lab 08/17/14 0812 08/18/14 0735  GLUCAP 85 79    Recent Results (from the past 240 hour(s))  Urine culture     Status: None   Collection Time: 08/16/14  4:58 PM  Result Value Ref Range Status   Specimen Description URINE, RANDOM  Final   Special Requests NONE  Final   Culture   Final    >=100,000 COLONIES/mL ESCHERICHIA COLI Performed at [facility]    Report Status 08/18/2014 FINAL  Final   Organism ID, Bacteria ESCHERICHIA COLI  Final      Susceptibility   Escherichia coli - MIC*    AMPICILLIN 4 SENSITIVE Sensitive     CEFAZOLIN <=4 SENSITIVE Sensitive     CEFTRIAXONE <=1 SENSITIVE Sensitive     CIPROFLOXACIN <=0.25 SENSITIVE Sensitive     GENTAMICIN <=1 SENSITIVE Sensitive     IMIPENEM <=0.25 SENSITIVE Sensitive     NITROFURANTOIN <=16 SENSITIVE Sensitive     TRIMETH/SULFA <=20 SENSITIVE Sensitive     AMPICILLIN/SULBACTAM <=2 SENSITIVE Sensitive     PIP/TAZO <=4 SENSITIVE Sensitive     * >=100,000 COLONIES/mL ESCHERICHIA COLI     Studies: Dg Chest 2 View  08/16/2014   CLINICAL DATA:  81 year old female with a history of urinary tract infection.  EXAM: CHEST - 2 VIEW  COMPARISON:  02/10/2014, 07/18/2012, 11/24/2010  FINDINGS: Cardiomediastinal silhouette is unchanged. Atherosclerotic calcification of the aortic arch.  No evidence of pulmonary vascular congestion.  Similar appearance of right suprahilar opacity compared to the prior. No confluent airspace disease.  No pneumothorax or pleural effusion.  Stigmata of emphysema, with increased retrosternal airspace, flattened hemidiaphragms, increased AP diameter, and hyperinflation on the AP view.  Unchanged configuration of mid thoracic  compression fracture without retropulsion of fracture fragments, and without significant progression of height loss.  Right-sided rib fractures appear at least partially healed.  IMPRESSION: No radiographic evidence of acute cardiopulmonary disease with a background of chronic changes and lung atelectasis.  Emphysema.  Atherosclerosis.  Remote fractures of right-sided ribs.  Signed,  Dulcy Fanny. Earleen Newport, DO  [facility]  [facility]   Electronically Signed   By: Corrie Mckusick D.O.   On: 08/16/2014 17:43   US Renal  08/17/2014   CLINICAL DATA:  Acute renal injury  EXAM: RENAL / URINARY TRACT ULTRASOUND COMPLETE  COMPARISON:  None.  FINDINGS: Right Kidney:  Length: 7.1 cm.  Mild increased echogenicity is noted.  Left Kidney:  Length: 9.7 cm.  Mild increased echogenicity is noted.  Bladder:  Appears normal for degree of bladder  distention. Some echogenic debris is noted within the bladder which may be related to urinary tract infection. Clinical correlation is recommended.  IMPRESSION: Mild increased echogenicity likely related to medical renal disease.   Electronically Signed   By: Inez Catalina M.D.   On: 08/17/2014 21:08    Scheduled Meds: . atorvastatin  20 mg Oral q1800  . calcium carbonate  1,250 mg Oral Q breakfast  . carvedilol  3.125 mg Oral BID WC  . cefTRIAXone (ROCEPHIN)  IV  1 g Intravenous Q24H  . cholecalciferol  2,000 Units Oral Daily  . folic acid  1 mg Oral Daily  . latanoprost  1 drop Both Eyes QHS  . mirtazapine  7.5 mg Oral QHS  . multivitamin with minerals  1 tablet Oral Daily  . omega-3 acid ethyl esters  1 g Oral BID  . pantoprazole  40 mg Oral Daily  . pregabalin  50 mg Oral Daily  . sodium bicarbonate  650 mg Oral BID  . sodium chloride  3 mL Intravenous Q12H  . thiamine  100 mg Oral Daily  . timolol  1 drop Both Eyes Daily  . vitamin B-12  1,000 mcg Oral 2 times per day on Mon Wed Fri   Continuous Infusions: . sodium chloride  50 mL/hr at 08/18/14 1010      Time spent: 25 minutes    Louellen Molder  [facility] Pager [phone] If 7PM-7AM, please contact night-coverage at [email or web address], password [facility] 08/18/2014, 12:38 PM  LOS: 2 days

## 2014-08-18 NOTE — Care Management (Signed)
Important Message  Patient Details  Name: [name] MRN: [id number] Date of Birth: November 10, 1925   Medicare Important Message Given:  Yes-second notification given    Shelda Altes, LCSW 08/18/2014, 2:54 PM

## 2014-08-19 DIAGNOSIS — G9340 Encephalopathy, unspecified: Secondary | ICD-10-CM

## 2014-08-19 LAB — BASIC METABOLIC PANEL
Anion gap: 8 (ref 5–15)
BUN: 47 mg/dL — ABNORMAL HIGH (ref 6–20)
CHLORIDE: 119 mmol/L — AB (ref 101–111)
CO2: 16 mmol/L — AB (ref 22–32)
CREATININE: 2.87 mg/dL — AB (ref 0.44–1.00)
Calcium: 8.7 mg/dL — ABNORMAL LOW (ref 8.9–10.3)
GFR calc Af Amer: 16 mL/min — ABNORMAL LOW (ref >60)
GFR calc non Af Amer: 14 mL/min — ABNORMAL LOW (ref >60)
Glucose, Bld: 93 mg/dL (ref 65–99)
Potassium: 4.2 mmol/L (ref 3.5–5.1)
SODIUM: 143 mmol/L (ref 135–145)

## 2014-08-19 LAB — GLUCOSE, CAPILLARY: Glucose-Capillary: 89 mg/dL (ref 65–99)

## 2014-08-19 NOTE — Progress Notes (Signed)
Patient to discharge back to [facility] when stable. Dia Crawford at [facility] aware. If ready over the weekend, please contact weekend CSW, Rollene Fare (ph#: [phone]) to facilitate discharge.      Raynaldo Opitz, [facility] Clinical Social Worker cell #: [phone]

## 2014-08-19 NOTE — Progress Notes (Signed)
[facility] PROGRESS NOTE  [name] [id number] DOB: 01-17-1926 DOA: 08/16/2014 PCP: Estill Dooms, MD  Brief narrative 81 year old female with history of chronic kidney disease stage III, hypertension, hyperlipidemia and GERD was sent from skilled nursing facility with acute kidney injury and hyperkalemia on routine blood work. Patient admitted to telemetry. She was also found to have UTI and started on empiric antibiotic.  Assessment/Plan: Acute and chronic kidney disease stage III Likely a combination of nephrotoxicity due to underlying medications (Lasix, ACE inhibitor and Aldactone) and UTI. -Discontinued all offending medications.  -renal fn slowly improving every day. Renal  US shows medical renal ds. Monitor with gentle IV hydration.  strict I/O and daily BMET .   E coli UTI Pansensitive.  Continue empiric Rocephin.   Hyperkalemia On admission.  No EKG changes.Marland KitchenResolved with kayexalate. . Discontinued ACE inhibitor and Aldactone. potassium supplements also  Held. Off telemetry.  Acute encephaloapthy Pt reportedly more confused for past 24 hrs. disoriented to place and person. May be due to UTI. At baseline she is quite oriented. TSH and B12 normal. ( is on b12 supplements) . Will monitor . Avoid narcotics or benzos.   Hyperlipidemia Continue statin.  History of diastolic CHF Currently euvolemic. Continue  gentle hydration. Holding  Lasix, lisinopril and Aldactone. Continue aspirin, statin and Coreg.  Anemia  Baseline hemoglobin of 12 about 4 months back. ( was 9-10 1 year back)  Patient complains of passing dark stools. Iron panel suggestive of anemia of chronic disease. B12 normal. Check stool for occult blood positive. . patient  not interested in any intervention at this time. Swill need h&H monitoring as outpt. No hx of weight loss. Daughter agrees with holding further w/up at this time.   Low bircabonate  added supplements    Diet: 2 g sodium  DVT  prophylaxis: SCD   Code Status: Full code  Family Communication: Spoke with daughter on the phone on 6/30 Disposition Plan:Return to skilled nursing facility on 7/2 if renal function continues to improve and encephalopathy resolves.  Consultants: None   Procedures: Renal ultrasound   Antibiotics: IV Rocephin 6/28--  HPI/Subjective: Patient seen and examined. Denies any abdominal pain, nausea or vomiting. Denies dysuria. Denies passing dark stools today.  Objective: Filed Vitals:   08/19/14 0846  BP: 155/60  Pulse: 68  Temp:   Resp:     Intake/Output Summary (Last 24 hours) at 08/19/14 0939 Last data filed at 08/19/14 0700  Gross per 24 hour  Intake 1644.17 ml  Output   1100 ml  Net 544.17 ml   Filed Weights   08/16/14 1947 08/18/14 1525 08/19/14 0452  Weight: 59.603 kg (131 lb 6.4 oz) 65.318 kg (144 lb) 62.37 kg (137 lb 8 oz)    Exam:   General:  Elderly female in no acute distress, confused  HEENT: pallor+, moist oral mucosa, supple neck  Chest: Clear to auscultation bilaterally, no added sounds  CVS: Normal S1 and S2, systolic murmur 3/6  GI: Soft, nondistended, nontender, bowel sounds present  Musculoskeletal: Warm, no edema  CNS: Alert and oriented x1 ( worse from yesterday)  Data Reviewed: Basic Metabolic Panel:  Recent Labs Lab 08/16/14 08/16/14 1641 08/16/14 2217 08/17/14 0410 08/18/14 0410 08/19/14 0350  NA 138 136  --  142 141 143  K 6.0* 5.9*  --  5.3* 4.2 4.2  CL  --  112*  --  118* 118* 119*  CO2  --  17*  --  17* 15* 16*  GLUCOSE  --  103*  --  89 88 93  BUN 84* 93*  --  86* 59* 47*  CREATININE 4.9* 5.15* 4.79* 4.45* 3.56* 2.87*  CALCIUM  --  9.1  --  8.6* 8.6* 8.7*   Liver Function Tests:  Recent Labs Lab 08/16/14 08/16/14 1641 08/17/14 0410  AST 10* 12* 11*  ALT 8 8* 8*  ALKPHOS 34 41 34*  BILITOT  --  0.6 0.5  PROT  --  6.7 5.9*  ALBUMIN  --  3.8 3.2*   No results for input(s): LIPASE, AMYLASE in the last 168  hours. No results for input(s): AMMONIA in the last 168 hours. CBC:  Recent Labs Lab 08/16/14 1641 08/16/14 2217 08/17/14 0410 08/18/14 0410  WBC 6.0 5.4 5.9 5.1  NEUTROABS 3.2  --   --   --   HGB 9.2* 9.3* 8.5* 8.2*  HCT 28.0* 27.5*  27.6* 25.9* 25.6*  MCV 91.2 90.8 90.9 91.4  PLT 211 209 186 186   Cardiac Enzymes: No results for input(s): CKTOTAL, CKMB, CKMBINDEX, TROPONINI in the last 168 hours. BNP (last 3 results) No results for input(s): BNP in the last 8760 hours.  ProBNP (last 3 results) No results for input(s): PROBNP in the last 8760 hours.  CBG:  Recent Labs Lab 08/17/14 0812 08/18/14 0735 08/19/14 0747  GLUCAP 85 79 89    Recent Results (from the past 240 hour(s))  Urine culture     Status: None   Collection Time: 08/16/14  4:58 PM  Result Value Ref Range Status   Specimen Description URINE, RANDOM  Final   Special Requests NONE  Final   Culture   Final    >=100,000 COLONIES/mL ESCHERICHIA COLI Performed at [facility]    Report Status 08/18/2014 FINAL  Final   Organism ID, Bacteria ESCHERICHIA COLI  Final      Susceptibility   Escherichia coli - MIC*    AMPICILLIN 4 SENSITIVE Sensitive     CEFAZOLIN <=4 SENSITIVE Sensitive     CEFTRIAXONE <=1 SENSITIVE Sensitive     CIPROFLOXACIN <=0.25 SENSITIVE Sensitive     GENTAMICIN <=1 SENSITIVE Sensitive     IMIPENEM <=0.25 SENSITIVE Sensitive     NITROFURANTOIN <=16 SENSITIVE Sensitive     TRIMETH/SULFA <=20 SENSITIVE Sensitive     AMPICILLIN/SULBACTAM <=2 SENSITIVE Sensitive     PIP/TAZO <=4 SENSITIVE Sensitive     * >=100,000 COLONIES/mL ESCHERICHIA COLI     Studies: US Renal  08/17/2014   CLINICAL DATA:  Acute renal injury  EXAM: RENAL / URINARY TRACT ULTRASOUND COMPLETE  COMPARISON:  None.  FINDINGS: Right Kidney:  Length: 7.1 cm.  Mild increased echogenicity is noted.  Left Kidney:  Length: 9.7 cm.  Mild increased echogenicity is noted.  Bladder:  Appears normal for degree of bladder  distention. Some echogenic debris is noted within the bladder which may be related to urinary tract infection. Clinical correlation is recommended.  IMPRESSION: Mild increased echogenicity likely related to medical renal disease.   Electronically Signed   By: Inez Catalina M.D.   On: 08/17/2014 21:08    Scheduled Meds: . atorvastatin  20 mg Oral q1800  . calcium carbonate  1,250 mg Oral Q breakfast  . carvedilol  3.125 mg Oral BID WC  . cefTRIAXone (ROCEPHIN)  IV  1 g Intravenous Q24H  . cholecalciferol  2,000 Units Oral Daily  . folic acid  1 mg Oral Daily  . latanoprost  1 drop Both Eyes  QHS  . mirtazapine  7.5 mg Oral QHS  . multivitamin with minerals  1 tablet Oral Daily  . omega-3 acid ethyl esters  1 g Oral BID  . pantoprazole  40 mg Oral Daily  . pregabalin  50 mg Oral Daily  . sodium bicarbonate  650 mg Oral BID  . sodium chloride  3 mL Intravenous Q12H  . thiamine  100 mg Oral Daily  . timolol  1 drop Both Eyes Daily  . vitamin B-12  1,000 mcg Oral 2 times per day on Mon Wed Fri   Continuous Infusions: . sodium chloride 50 mL/hr at 08/18/14 1255      Time spent: 25 minutes    Louellen Molder  [facility] Pager [phone] If 7PM-7AM, please contact night-coverage at [email or web address], password [facility] 08/19/2014, 9:39 AM  LOS: 3 days

## 2014-08-20 ENCOUNTER — Encounter (HOSPITAL_COMMUNITY): Payer: Self-pay | Admitting: Student

## 2014-08-20 DIAGNOSIS — N390 Urinary tract infection, site not specified: Secondary | ICD-10-CM

## 2014-08-20 DIAGNOSIS — E875 Hyperkalemia: Secondary | ICD-10-CM

## 2014-08-20 DIAGNOSIS — N183 Chronic kidney disease, stage 3 (moderate): Secondary | ICD-10-CM

## 2014-08-20 DIAGNOSIS — B9620 Unspecified Escherichia coli [E. coli] as the cause of diseases classified elsewhere: Secondary | ICD-10-CM

## 2014-08-20 DIAGNOSIS — N179 Acute kidney failure, unspecified: Principal | ICD-10-CM

## 2014-08-20 DIAGNOSIS — E872 Acidosis: Secondary | ICD-10-CM

## 2014-08-20 LAB — BASIC METABOLIC PANEL
ANION GAP: 10 (ref 5–15)
BUN: 36 mg/dL — ABNORMAL HIGH (ref 6–20)
CO2: 17 mmol/L — AB (ref 22–32)
CREATININE: 2.78 mg/dL — AB (ref 0.44–1.00)
Calcium: 8.6 mg/dL — ABNORMAL LOW (ref 8.9–10.3)
Chloride: 115 mmol/L — ABNORMAL HIGH (ref 101–111)
GFR calc Af Amer: 17 mL/min — ABNORMAL LOW (ref >60)
GFR calc non Af Amer: 14 mL/min — ABNORMAL LOW (ref >60)
GLUCOSE: 89 mg/dL (ref 65–99)
Potassium: 4 mmol/L (ref 3.5–5.1)
Sodium: 142 mmol/L (ref 135–145)

## 2014-08-20 LAB — GLUCOSE, CAPILLARY: GLUCOSE-CAPILLARY: 86 mg/dL (ref 65–99)

## 2014-08-20 MED ORDER — SODIUM BICARBONATE 650 MG PO TABS: 650.0000 mg | ORAL_TABLET | Freq: Two times a day (BID) | ORAL | Status: DC

## 2014-08-20 MED ORDER — ACETAMINOPHEN 325 MG PO TABS: 650.0000 mg | ORAL_TABLET | Freq: Four times a day (QID) | ORAL | Status: DC | PRN

## 2014-08-20 NOTE — Progress Notes (Signed)
Report called to [facility] at [place]. Brigitte Pulse, RN

## 2014-08-20 NOTE — Clinical Social Work Note (Signed)
CSW received a call from MD that pt was ready for discharge  CSW called [facility] and spoke with RN to discuss discharge today  CSW faxed discharge summary, FL2, and prepared packet  CSW met with pt at bedside to discuss who will transport her.  Pt stated that her daughter would provide transportation  CSW called and spoke with pt's daughter [name] who stated she would pick pt up at 10:30.  CSW provided RN with packet  No further CSW needs  .Dede Query, LCSW [facility] Clinical Social Worker - Weekend Coverage cell #: [phone]

## 2014-08-20 NOTE — Progress Notes (Signed)
Physical Therapy Treatment Patient Details Name: [name] MRN: [id number] DOB: [birth year] Today's Date: 08/20/2014    History of Present Illness      PT Comments    Assisted pt OOb to amb to BR to void.  Pt able to self perform self hygiene.  Stood at sink several minutes to brush teeth.  Assisted with amb in hallway using rollator.  Positioned in recliner.   Follow Up Recommendations  Home health PT (at ALF)     Equipment Recommendations  None recommended by PT    Recommendations for Other Services       Precautions / Restrictions Precautions Precautions: None Restrictions Weight Bearing Restrictions: No    Mobility  Bed Mobility Overal bed mobility: Needs Assistance Bed Mobility: Supine to Sit     Supine to sit: Supervision     General bed mobility comments: increased time  Transfers Overall transfer level: Needs assistance Equipment used: 4-wheeled walker Transfers: Sit to/from Stand Sit to Stand: Supervision         General transfer comment: good safety cognition and use of hands  Ambulation/Gait Ambulation/Gait assistance: Supervision Ambulation Distance (Feet): 75 Feet Assistive device: 4-wheeled walker Gait Pattern/deviations: Step-through pattern;Decreased stride length Gait velocity: WFL   General Gait Details: good alternating gait.  Mild c/o weakness.  "I have not been walking much"   Stairs            Wheelchair Mobility    Modified Rankin (Stroke Patients Only)       Balance                                    Cognition Arousal/Alertness: Awake/alert Behavior During Therapy: WFL for tasks assessed/performed Overall Cognitive Status: Within Functional Limits for tasks assessed                      Exercises      General Comments        Pertinent Vitals/Pain Pain Assessment: No/denies pain    Home Living                      Prior Function            PT Goals (current  goals can now be found in the care plan section) Progress towards PT goals: Progressing toward goals    Frequency  Min 3X/week    PT Plan      Co-evaluation             End of Session Equipment Utilized During Treatment: Gait belt Activity Tolerance: Patient tolerated treatment well Patient left: in chair;with call bell/phone within reach     Time: 0845-0910 PT Time Calculation (min) (ACUTE ONLY): 25 min  Charges:  $Gait Training: 8-22 mins $Therapeutic Activity: 8-22 mins                    G Codes:      Rica Koyanagi  PTA  [facility]  Acute  Rehab Pager      [phone]

## 2014-08-20 NOTE — Discharge Instructions (Signed)

## 2014-08-20 NOTE — Progress Notes (Signed)
Attempted to call report to Beverly Milch, RN at [facility]. Placed on hold and redirected to voicemail. Will send my phone number with discharge information and attempt to call back in 30 minutes Kahlia Lagunes M. Brigitte Pulse, RN

## 2014-08-20 NOTE — Discharge Summary (Signed)
Physician Discharge Summary  [name] [id number] DOB: 28-Mar-1925 DOA: 08/16/2014  PCP: Estill Dooms, MD  Admit date: 08/16/2014 Discharge date: 08/20/2014  Recommendations for Outpatient Follow-up:  1. New medication is sodium bicarbonate. Please continue supplementation twice a day until CO2 normalizes. Current CO2 is 17. 2. Pleas hold lasix and lisinopril due to renal insufficiency. 3. Please note patient has taken total of 5 days of Rocephin for Escherichia coli UTI. She does not need abx beyond discharge.  Discharge Diagnoses:  Principal Problem:   Acute renal failure superimposed on stage 3 chronic kidney disease Active Problems:   Hypertension   Hyperlipidemia   Anemia   Hyperkalemia   Acute renal failure   UTI (lower urinary tract infection)    Discharge Condition: stable   Diet recommendation: as tolerated   History of present illness:  81 year old female with history of chronic kidney disease stage III, hypertension, hyperlipidemia and GERD who was sent from skilled nursing facility because of acute kidney injury and hyperkalemia found on routine blood work. Patient was found to have UTI on admission and was started on empiric antibiotic.  Hospital Course:   Acute and chronic kidney disease stage III - Baseline creatinine 2.8 and on this admission as high as 5.15. - Acute worsening renal insufficiency likely secondary to Lasix, lisinopril. - Those medications were held at the time of the admission. - She can continue spironolactone on discharge however. - Her renal function has improved back to her baseline of 2.8.   E coli UTI - Pansensitive. - Patient has received total of 5 days of IV Rocephin including today. This is uncomplicated urinary tract infection and she is adequately treated for UTI so no need for further anti-biotic treatment on discharge.   Hyperkalemia - Likely from spironolactone and potassium supplementation she was on prior to  admission  - Potassium resolved to normal with Kayexalate.  - No acute changes on 12-lead EKG at the time of the admission.   Acute encephaloapthy - Likely from urinary tract infection.  - TSH and B-12 are within normal limits  - Mental status improved   Hyperlipidemia - Continue statin therapy   History of diastolic CHF - Patient may continue Coreg on discharge.  - Continue Aldactone on discharge  - Currently compensated   Anemia  of chronic disease  - Secondary to anemia of chronic kidney disease.  - Hemoglobin is stable  - No current indications for transfusion   Metabolic acidosis - Probably from acute worsening renal function  - Continue sodium bicarbonate supplementation twice daily.   DVT prophylaxis - SCD's bilaterally    Code Status: Full code  Family Communication: Family not at the bedside this am   Consultants: None   Procedures: Renal ultrasound   Antibiotics: IV Rocephin 08/16/2014 --> 08/20/2014   Signed:  Leisa Lenz, MD  [facility] 08/20/2014, 8:45 AM  Pager #: [phone]  Time spent in minutes: more than 30 minutes  Discharge Exam: Filed Vitals:   08/20/14 0445  BP: 136/52  Pulse: 69  Temp: 98.3 F (36.8 C)  Resp: 17   Filed Vitals:   08/19/14 1301 08/19/14 1808 08/19/14 2043 08/20/14 0445  BP: 137/51 140/60 120/49 136/52  Pulse: 74 75 65 69  Temp: 98.3 F (36.8 C)  98.1 F (36.7 C) 98.3 F (36.8 C)  TempSrc: Oral  Oral Oral  Resp: 16  16 17   Height:      Weight:    63.005 kg (138 lb 14.4  oz)  SpO2: 99%  97% 94%    General: Pt is alert, follows commands appropriately, not in acute distress Cardiovascular: Rate cotrolled, S1/S2 appreciated  Respiratory: Clear to auscultation bilaterally, no wheezing, no crackles, no rhonchi Abdominal: Soft, non tender, non distended, bowel sounds +, no guarding Extremities: no edema, no cyanosis, pulses palpable bilaterally DP and PT Neuro: Grossly nonfocal  Discharge  Instructions  Discharge Instructions    Call MD for:  difficulty breathing, headache or visual disturbances    Complete by:  As directed      Call MD for:  persistant nausea and vomiting    Complete by:  As directed      Call MD for:  severe uncontrolled pain    Complete by:  As directed      Diet - low sodium heart healthy    Complete by:  As directed      Discharge instructions    Complete by:  As directed   1. New medication is sodium bicarbonate. Please continue supplementation twice a day until CO2 normalizes. Current CO2 is 17. 2. Pleas hold lasix and lisinopril due to renal insufficiency.     Increase activity slowly    Complete by:  As directed             Medication List    STOP taking these medications        esomeprazole 40 MG capsule  Commonly known as:  NEXIUM     furosemide 20 MG tablet  Commonly known as:  LASIX     lisinopril 5 MG tablet  Commonly known as:  PRINIVIL,ZESTRIL     traMADol 50 MG tablet  Commonly known as:  ULTRAM      TAKE these medications        acetaminophen 325 MG tablet  Commonly known as:  TYLENOL  Take 2 tablets (650 mg total) by mouth every 6 (six) hours as needed for mild pain (or Fever >/= 101).     aspirin 81 MG tablet  Take 81 mg by mouth daily.     atorvastatin 20 MG tablet  Commonly known as:  LIPITOR  Take one tablet by mouth once daily for cholesterol     BETIMOL 0.5 % ophthalmic solution  Generic drug:  timolol  Place 1 drop into both eyes every morning.     calcium carbonate 600 MG Tabs tablet  Commonly known as:  OS-CAL  Take 600 mg by mouth daily.     carvedilol 3.125 MG tablet  Commonly known as:  COREG  Take one tablet by mouth twice daily with a meal     Fish Oil 1200 MG Caps  Take 1,200 mg by mouth 2 (two) times daily.     KLOR-CON M10 10 MEQ tablet  Generic drug:  potassium chloride  TAKE 1 TABLET BY MOUTH EVERY DAY     latanoprost 0.005 % ophthalmic solution  Commonly known as:  XALATAN   Place 1 drop into both eyes at bedtime.     mirtazapine 7.5 MG tablet  Commonly known as:  REMERON  TAKE 1 TABLET (7.5 MG TOTAL) BY MOUTH AT BEDTIME. NIGHTLY FOR APPETITE     multivitamin capsule  Take 1 capsule by mouth daily. 2000 units by mouth daily     omeprazole 20 MG capsule  Commonly known as:  PRILOSEC  Take 20 mg by mouth daily.     pregabalin 50 MG capsule  Commonly known as:  LYRICA  Take 50  mg by mouth. Take one capsule daily     sodium bicarbonate 650 MG tablet  Take 1 tablet (650 mg total) by mouth 2 (two) times daily.     spironolactone 25 MG tablet  Commonly known as:  ALDACTONE  Take 1 tablet (25 mg total) by mouth daily.     vitamin B-12 1000 MCG tablet  Commonly known as:  CYANOCOBALAMIN  Take 1,000 mcg by mouth 2 (two) times daily. Take one tablet by mouth twice daily on Mondays,Wednesdays and Fridays     Vitamin D3 2000 UNITS Tabs  Take 2,000 Units by mouth daily.           Follow-up Information    Follow up with GREEN, [name], MD. Schedule an appointment as soon as possible for a visit in 1 week.   Specialty:  Internal Medicine   Contact information:   [place] [address] [phone]        The results of significant diagnostics from this hospitalization (including imaging, microbiology, ancillary and laboratory) are listed below for reference.    Significant Diagnostic Studies: Dg Chest 2 View  08/16/2014   CLINICAL DATA:  81 year old female with a history of urinary tract infection.  EXAM: CHEST - 2 VIEW  COMPARISON:  02/10/2014, 07/18/2012, 11/24/2010  FINDINGS: Cardiomediastinal silhouette is unchanged. Atherosclerotic calcification of the aortic arch.  No evidence of pulmonary vascular congestion.  Similar appearance of right suprahilar opacity compared to the prior. No confluent airspace disease.  No pneumothorax or pleural effusion.  Stigmata of emphysema, with increased retrosternal airspace, flattened  hemidiaphragms, increased AP diameter, and hyperinflation on the AP view.  Unchanged configuration of mid thoracic compression fracture without retropulsion of fracture fragments, and without significant progression of height loss.  Right-sided rib fractures appear at least partially healed.  IMPRESSION: No radiographic evidence of acute cardiopulmonary disease with a background of chronic changes and lung atelectasis.  Emphysema.  Atherosclerosis.  Remote fractures of right-sided ribs.  Signed,  Dulcy Fanny. Earleen Newport, DO  [facility]  [facility]   Electronically Signed   By: Corrie Mckusick D.O.   On: 08/16/2014 17:43   US Renal  08/17/2014   CLINICAL DATA:  Acute renal injury  EXAM: RENAL / URINARY TRACT ULTRASOUND COMPLETE  COMPARISON:  None.  FINDINGS: Right Kidney:  Length: 7.1 cm.  Mild increased echogenicity is noted.  Left Kidney:  Length: 9.7 cm.  Mild increased echogenicity is noted.  Bladder:  Appears normal for degree of bladder distention. Some echogenic debris is noted within the bladder which may be related to urinary tract infection. Clinical correlation is recommended.  IMPRESSION: Mild increased echogenicity likely related to medical renal disease.   Electronically Signed   By: Inez Catalina M.D.   On: 08/17/2014 21:08    Microbiology: Recent Results (from the past 240 hour(s))  Urine culture     Status: None   Collection Time: 08/16/14  4:58 PM  Result Value Ref Range Status   Specimen Description URINE, RANDOM  Final   Special Requests NONE  Final   Culture   Final    >=100,000 COLONIES/mL ESCHERICHIA COLI Performed at [facility]    Report Status 08/18/2014 FINAL  Final   Organism ID, Bacteria ESCHERICHIA COLI  Final      Susceptibility   Escherichia coli - MIC*    AMPICILLIN 4 SENSITIVE Sensitive     CEFAZOLIN <=4 SENSITIVE Sensitive     CEFTRIAXONE <=  1 SENSITIVE Sensitive     CIPROFLOXACIN <=0.25 SENSITIVE Sensitive      GENTAMICIN <=1 SENSITIVE Sensitive     IMIPENEM <=0.25 SENSITIVE Sensitive     NITROFURANTOIN <=16 SENSITIVE Sensitive     TRIMETH/SULFA <=20 SENSITIVE Sensitive     AMPICILLIN/SULBACTAM <=2 SENSITIVE Sensitive     PIP/TAZO <=4 SENSITIVE Sensitive     * >=100,000 COLONIES/mL ESCHERICHIA COLI     Labs: Basic Metabolic Panel:  Recent Labs Lab 08/16/14 1641 08/16/14 2217 08/17/14 0410 08/18/14 0410 08/19/14 0350 08/20/14 0423  NA 136  --  142 141 143 142  K 5.9*  --  5.3* 4.2 4.2 4.0  CL 112*  --  118* 118* 119* 115*  CO2 17*  --  17* 15* 16* 17*  GLUCOSE 103*  --  89 88 93 89  BUN 93*  --  86* 59* 47* 36*  CREATININE 5.15* 4.79* 4.45* 3.56* 2.87* 2.78*  CALCIUM 9.1  --  8.6* 8.6* 8.7* 8.6*   Liver Function Tests:  Recent Labs Lab 08/16/14 08/16/14 1641 08/17/14 0410  AST 10* 12* 11*  ALT 8 8* 8*  ALKPHOS 34 41 34*  BILITOT  --  0.6 0.5  PROT  --  6.7 5.9*  ALBUMIN  --  3.8 3.2*   No results for input(s): LIPASE, AMYLASE in the last 168 hours. No results for input(s): AMMONIA in the last 168 hours. CBC:  Recent Labs Lab 08/16/14 1641 08/16/14 2217 08/17/14 0410 08/18/14 0410  WBC 6.0 5.4 5.9 5.1  NEUTROABS 3.2  --   --   --   HGB 9.2* 9.3* 8.5* 8.2*  HCT 28.0* 27.5*  27.6* 25.9* 25.6*  MCV 91.2 90.8 90.9 91.4  PLT 211 209 186 186   Cardiac Enzymes: No results for input(s): CKTOTAL, CKMB, CKMBINDEX, TROPONINI in the last 168 hours. BNP: BNP (last 3 results) No results for input(s): BNP in the last 8760 hours.  ProBNP (last 3 results) No results for input(s): PROBNP in the last 8760 hours.  CBG:  Recent Labs Lab 08/17/14 0812 08/18/14 0735 08/19/14 0747 08/20/14 0742  GLUCAP 85 79 89 86

## 2014-08-20 NOTE — Progress Notes (Signed)
Reviewed discharge information with patient and caregiver. Answered all questions. Patient/caregiver able to teach back medications and reasons to contact MD/911. Caregiver verbalizes importance of PCP follow up appointment.

## 2014-08-25 ENCOUNTER — Non-Acute Institutional Stay: Payer: Medicare Other | Admitting: Internal Medicine

## 2014-08-25 ENCOUNTER — Encounter: Payer: Self-pay | Admitting: Internal Medicine

## 2014-08-25 VITALS — BP 192/86 | HR 68 | Temp 97.4°F | Wt 135.0 lb

## 2014-08-25 DIAGNOSIS — M25561 Pain in right knee: Secondary | ICD-10-CM

## 2014-08-25 DIAGNOSIS — N390 Urinary tract infection, site not specified: Secondary | ICD-10-CM

## 2014-08-25 DIAGNOSIS — I639 Cerebral infarction, unspecified: Secondary | ICD-10-CM

## 2014-08-25 DIAGNOSIS — R634 Abnormal weight loss: Secondary | ICD-10-CM | POA: Diagnosis not present

## 2014-08-25 DIAGNOSIS — F411 Generalized anxiety disorder: Secondary | ICD-10-CM

## 2014-08-25 DIAGNOSIS — D649 Anemia, unspecified: Secondary | ICD-10-CM

## 2014-08-25 DIAGNOSIS — I10 Essential (primary) hypertension: Secondary | ICD-10-CM

## 2014-08-25 DIAGNOSIS — R0600 Dyspnea, unspecified: Secondary | ICD-10-CM | POA: Diagnosis not present

## 2014-08-25 DIAGNOSIS — N183 Chronic kidney disease, stage 3 (moderate): Secondary | ICD-10-CM | POA: Diagnosis not present

## 2014-08-25 DIAGNOSIS — N179 Acute kidney failure, unspecified: Secondary | ICD-10-CM | POA: Diagnosis not present

## 2014-08-26 ENCOUNTER — Encounter: Payer: Self-pay | Admitting: Nurse Practitioner

## 2014-08-26 ENCOUNTER — Telehealth: Payer: Self-pay

## 2014-08-26 ENCOUNTER — Non-Acute Institutional Stay: Payer: Medicare Other | Admitting: Nurse Practitioner

## 2014-08-26 DIAGNOSIS — M25561 Pain in right knee: Secondary | ICD-10-CM | POA: Diagnosis not present

## 2014-08-26 DIAGNOSIS — I5032 Chronic diastolic (congestive) heart failure: Secondary | ICD-10-CM | POA: Diagnosis not present

## 2014-08-26 DIAGNOSIS — N179 Acute kidney failure, unspecified: Secondary | ICD-10-CM

## 2014-08-26 DIAGNOSIS — K219 Gastro-esophageal reflux disease without esophagitis: Secondary | ICD-10-CM

## 2014-08-26 DIAGNOSIS — R609 Edema, unspecified: Secondary | ICD-10-CM | POA: Diagnosis not present

## 2014-08-26 DIAGNOSIS — N390 Urinary tract infection, site not specified: Secondary | ICD-10-CM | POA: Diagnosis not present

## 2014-08-26 DIAGNOSIS — M5440 Lumbago with sciatica, unspecified side: Secondary | ICD-10-CM

## 2014-08-26 DIAGNOSIS — N189 Chronic kidney disease, unspecified: Secondary | ICD-10-CM

## 2014-08-26 DIAGNOSIS — E875 Hyperkalemia: Secondary | ICD-10-CM

## 2014-08-26 DIAGNOSIS — N183 Chronic kidney disease, stage 3 (moderate): Secondary | ICD-10-CM | POA: Diagnosis not present

## 2014-08-26 DIAGNOSIS — D631 Anemia in chronic kidney disease: Secondary | ICD-10-CM | POA: Diagnosis not present

## 2014-08-26 DIAGNOSIS — I10 Essential (primary) hypertension: Secondary | ICD-10-CM

## 2014-08-26 DIAGNOSIS — N289 Disorder of kidney and ureter, unspecified: Secondary | ICD-10-CM

## 2014-08-26 NOTE — Assessment & Plan Note (Signed)
Hgb 8s while in hospital, related CKD, update CBC

## 2014-08-26 NOTE — Progress Notes (Signed)
Patient ID: [name], female   DOB: [birth year], 82 y.o.   MRN: [id number]   Code Status: Full code  Allergies  Allergen Reactions  . Sulfa Antibiotics Itching    Chief Complaint  Patient presents with  . Medical Management of Chronic Issues  . Acute Visit    ankle edema.     HPI: Patient is a 81 y.o. female seen in the [facility] at [facility] today for evaluation of R+L ankle edema and other chronic medical conditions.    Hospitalized 08/16/2014-08/20/2014: UTI E-Coli was fully treated upon discharge. Acute renal failure superimposed on stage 3 chronic kidney disease-improved creatinine down from 4s to 2s-Off Furosemide and Lisinopril. Anemia-Hgb 8s no blood transfusion required during hospital stay.    Problem List Items Addressed This Visit    Hypertension (Chronic)    Controlled, takes Carvedilol 3.125mg  bid.       Edema - Primary (Chronic)    Worse in R+L ankles, chronic cough, denied phlegm production, SOB, chest pain, or palpitation. Continue Spironolactone 25mg  daily. Will weight daily, Furosemide 40mg  daily prn for wt gain 2-3 Ibs/24hrs or 3-5Ibs/week. Update BMP in one week.       Chronic diastolic congestive heart failure (Chronic)    Continue Spironolactone 25mg  daily, adding Furosemide 40mg  prn for weight gain 2-3/24hr or 3-5/week       Lumbago    takes Lyrica 50mg  qd for back and BLE neuropathic pain.       GERD (gastroesophageal reflux disease)    Stable, takes Omeprazole 20mg  daily.       Anemia    Hgb 8s while in hospital, related CKD, update CBC      Right knee pain    F/u Ortho next Monday, had steroid inj and effusion aspiration. Pain and swelling present       Acute on chronic renal insufficiency    08/16/14 Bun/creat 84/4.0, K 6.0 Creatinine down from 4s to 2 s and K from 6s to 4s during hospitalization, off Furosemide and continued Spironolactone 25mg .       Acute renal failure superimposed on stage 3 chronic kidney disease   Creatinine down from 4s to 2s during hospitalization.       Hyperkalemia    08/16/14 K 6.0 K 4s while in hospital, update CMP      UTI (lower urinary tract infection)    E-coli, fully treated upon hospital discharge 08/20/14. Asymptomatic today.         Review of Systems:  Review of Systems  Constitutional:       Complains of generalized weakness  HENT: Positive for hearing loss. Negative for ear pain.   Eyes: Negative.   Respiratory: Negative.   Cardiovascular: Positive for leg swelling (ankles). Negative for chest pain and palpitations.       Increased edema R+L ankles  Gastrointestinal:       Complains of chronic mild right lower quadrant abdominal pain. History of diverticulosis.  Endocrine: Negative.   Genitourinary: Negative.   Musculoskeletal: Positive for back pain and arthralgias. Negative for myalgias, neck pain and neck stiffness.       Flat feet with dropped arches. Pin in the right medial malleolar bones. Chronic back pain. Chronic right shoulder pain. Uses 4 wheel walker.  Skin: Negative.   Neurological: Positive for weakness (Generalized).       Complaints of confusion earlier today  Hematological:       History of anemia.  Psychiatric/Behavioral: The patient is nervous/anxious.  Past Medical History  Diagnosis Date  . Senile osteoporosis   . Heart murmur   . Small bowel obstruction   . GERD (gastroesophageal reflux disease)   . Hypertension   . Glaucoma   . Arthritis     Osteoarthritis  . Personal history of fall   . Edema   . Hyperlipidemia   . Diverticulosis of colon (without mention of hemorrhage)   . Other malaise and fatigue   . Hypertonicity of bladder   . Osteoarthrosis, unspecified whether generalized or localized, unspecified site   . Lumbago   . Edema   . Pain in joint, pelvic region and thigh   . Pain in joint, shoulder region   . Flat feet   . Cerebral ischemia 12/19/2012    Microvascular   . Cerebral atrophy 12/19/2012  .  Cerebral atherosclerosis 12/19/2012  . RLQ abdominal pain 01/17/2011   Past Surgical History  Procedure Laterality Date  . Tonsillectomy    . Colon surgery  02/1996    Colectomy, partial for diverticular bleed 90%  . Joint replacement Left 12/2000    Left knee; Dr. Wynelle Link  . Breast lumpectomy Bilateral   . Laparoscopic lysis of adhesions  11/15/10    Exploratory   Social History:   reports that she quit smoking about 33 years ago. Her smoking use included Cigarettes. She has a 15 pack-year smoking history. She has never used smokeless tobacco. She reports that she drinks about 0.6 oz of alcohol per week. She reports that she does not use illicit drugs.  Family History  Problem Relation Age of Onset  . Cancer Father 55    colon    Medications: Patient's Medications  New Prescriptions   No medications on file  Previous Medications   ACETAMINOPHEN (TYLENOL) 325 MG TABLET    Take 2 tablets (650 mg total) by mouth every 6 (six) hours as needed for mild pain (or Fever >/= 101).   ASPIRIN 81 MG TABLET    Take 81 mg by mouth daily.     ATORVASTATIN (LIPITOR) 20 MG TABLET    Take one tablet by mouth once daily for cholesterol   BETIMOL 0.5 % OPHTHALMIC SOLUTION    Place 1 drop into both eyes every morning.    CALCIUM CARBONATE (OS-CAL) 600 MG TABS    Take 600 mg by mouth daily.   CARVEDILOL (COREG) 3.125 MG TABLET    Take one tablet by mouth twice daily with a meal   CHOLECALCIFEROL (VITAMIN D3) 2000 UNITS TABS    Take 2,000 Units by mouth daily.     CLONIDINE (CATAPRES) 0.1 MG TABLET    Take 0.1 mg by mouth. Take one tablet by mouth twice daily   KLOR-CON M10 10 MEQ TABLET    TAKE 1 TABLET BY MOUTH EVERY DAY   LATANOPROST (XALATAN) 0.005 % OPHTHALMIC SOLUTION    Place 1 drop into both eyes at bedtime.    MIRTAZAPINE (REMERON) 7.5 MG TABLET    TAKE 1 TABLET (7.5 MG TOTAL) BY MOUTH AT BEDTIME. NIGHTLY FOR APPETITE   MULTIPLE VITAMIN (MULTIVITAMIN) CAPSULE    Take 1 capsule by mouth daily.  2000 units by mouth daily   OMEGA-3 FATTY ACIDS (FISH OIL) 1200 MG CAPS    Take 1,200 mg by mouth 2 (two) times daily.     OMEPRAZOLE (PRILOSEC) 20 MG CAPSULE    Take 20 mg by mouth daily.   PREGABALIN (LYRICA) 50 MG CAPSULE    Take 50 mg  by mouth. Take one capsule daily   SODIUM BICARBONATE 650 MG TABLET    Take 1 tablet (650 mg total) by mouth 2 (two) times daily.   SPIRONOLACTONE (ALDACTONE) 25 MG TABLET    Take 1 tablet (25 mg total) by mouth daily.   VITAMIN B-12 (CYANOCOBALAMIN) 1000 MCG TABLET    Take 1,000 mcg by mouth 2 (two) times daily. Take one tablet by mouth twice daily on Mondays,Wednesdays and Fridays  Modified Medications   No medications on file  Discontinued Medications   No medications on file     Physical Exam: Physical Exam  Constitutional: She is oriented to person, place, and time. She appears well-developed and well-nourished. No distress.  HENT:  Head: Normocephalic and atraumatic.  Right Ear: External ear normal.  Left Ear: External ear normal.  Nose: Nose normal.  Mouth/Throat: Oropharynx is clear and moist. No oropharyngeal exudate.  Eyes: Conjunctivae and EOM are normal. Pupils are equal, round, and reactive to light. Right eye exhibits no discharge. Left eye exhibits no discharge. No scleral icterus.  Neck: Normal range of motion. Neck supple. No JVD present. No thyromegaly present.  Cardiovascular: Normal rate and regular rhythm.   Murmur heard.  Systolic murmur is present with a grade of 2/6  Pulmonary/Chest: Effort normal and breath sounds normal. No respiratory distress. She has no wheezes. She has no rales. She exhibits no tenderness.  Abdominal: Soft. Bowel sounds are normal. She exhibits no distension. There is no tenderness. There is no rebound.  Musculoskeletal: Normal range of motion. She exhibits edema (1+  bipedal). She exhibits no tenderness.  R+L ankle edema.   Lymphadenopathy:    She has no cervical adenopathy.  Neurological: She is  alert and oriented to person, place, and time. She has normal reflexes. No cranial nerve deficit. She exhibits normal muscle tone. Coordination normal.  04/21/14 MMSE 28/30. Failed clock drawing. Mild stutter.  Skin: Skin is warm and dry. No rash noted. She is not diaphoretic. No erythema.  Psychiatric: She has a normal mood and affect. Her behavior is normal. Judgment and thought content normal.    Filed Vitals:   08/26/14 1658  BP: 144/76  Pulse: 68  Temp: 98.5 F (36.9 C)  TempSrc: Tympanic  Resp: 18      Labs reviewed: Basic Metabolic Panel:  Recent Labs  02/03/14  08/16/14  08/16/14 2217  08/18/14 0410 08/19/14 0350 08/20/14 0423  NA 141  < > 138  < >  --   < > 141 143 142  K 3.8  < > 6.0*  < >  --   < > 4.2 4.2 4.0  CL  --   --   --   < >  --   < > 118* 119* 115*  CO2  --   --   --   < >  --   < > 15* 16* 17*  GLUCOSE  --   --   --   < >  --   < > 88 93 89  BUN 15  < > 84*  < >  --   < > 59* 47* 36*  CREATININE 1.3*  < > 4.9*  < > 4.79*  < > 3.56* 2.87* 2.78*  CALCIUM  --   --   --   < >  --   < > 8.6* 8.7* 8.6*  TSH 2.29  --  2.78  --  2.227  --   --   --   --   < > =  values in this interval not displayed. Liver Function Tests:  Recent Labs  08/16/14 08/16/14 1641 08/17/14 0410  AST 10* 12* 11*  ALT 8 8* 8*  ALKPHOS 34 41 34*  BILITOT  --  0.6 0.5  PROT  --  6.7 5.9*  ALBUMIN  --  3.8 3.2*   No results for input(s): LIPASE, AMYLASE in the last 8760 hours. No results for input(s): AMMONIA in the last 8760 hours. CBC:  Recent Labs  08/16/14 1641 08/16/14 2217 08/17/14 0410 08/18/14 0410  WBC 6.0 5.4 5.9 5.1  NEUTROABS 3.2  --   --   --   HGB 9.2* 9.3* 8.5* 8.2*  HCT 28.0* 27.5*  27.6* 25.9* 25.6*  MCV 91.2 90.8 90.9 91.4  PLT 211 209 186 186   Lipid Panel:  Recent Labs  09/28/13 03/15/14 08/16/14  CHOL 191 190 89  HDL 62 54 39  LDLCALC 108 21 38  TRIG 104 104 62     Past Procedures:  08/16/14 CXR  IMPRESSION: No radiographic  evidence of acute cardiopulmonary disease with a background of chronic changes and lung atelectasis.  Emphysema.  Atherosclerosis.  Remote fractures of right-sided ribs.  08/17/14 rental [place]:  IMPRESSION: Mild increased echogenicity likely related to medical renal disease.  Assessment/Plan Edema Worse in R+L ankles, chronic cough, denied phlegm production, SOB, chest pain, or palpitation. Continue Spironolactone 25mg  daily. Will weight daily, Furosemide 40mg  daily prn for wt gain 2-3 Ibs/24hrs or 3-5Ibs/week. Update BMP in one week.   Chronic diastolic congestive heart failure Continue Spironolactone 25mg  daily, adding Furosemide 40mg  prn for weight gain 2-3/24hr or 3-5/week   GERD (gastroesophageal reflux disease) Stable, takes Omeprazole 20mg  daily.   Hypertension Controlled, takes Carvedilol 3.125mg  bid.   Right knee pain F/u Ortho next Monday, had steroid inj and effusion aspiration. Pain and swelling present   Lumbago takes Lyrica 50mg  qd for back and BLE neuropathic pain.   Anemia Hgb 8s while in hospital, related CKD, update CBC  Hyperkalemia 08/16/14 K 6.0 K 4s while in hospital, update CMP  Acute renal failure superimposed on stage 3 chronic kidney disease Creatinine down from 4s to 2s during hospitalization.   Acute on chronic renal insufficiency 08/16/14 Bun/creat 84/4.0, K 6.0 Creatinine down from 4s to 2 s and K from 6s to 4s during hospitalization, off Furosemide and continued Spironolactone 25mg .   UTI (lower urinary tract infection) E-coli, fully treated upon hospital discharge 08/20/14. Asymptomatic today.    Family/ Staff Communication: observe the patient.   Goals of Care: AL  Labs/tests ordered: CBC and BMP

## 2014-08-26 NOTE — Assessment & Plan Note (Signed)
08/16/14 K 6.0 K 4s while in hospital, update CMP

## 2014-08-26 NOTE — Assessment & Plan Note (Signed)
F/u Ortho next Monday, had steroid inj and effusion aspiration. Pain and swelling present

## 2014-08-26 NOTE — Assessment & Plan Note (Signed)
08/16/14 Bun/creat 84/4.0, K 6.0 Creatinine down from 4s to 2 s and K from 6s to 4s during hospitalization, off Furosemide and continued Spironolactone 25mg .

## 2014-08-26 NOTE — Assessment & Plan Note (Signed)
Stable, takes Omeprazole 20mg  daily.

## 2014-08-26 NOTE — Telephone Encounter (Signed)
Received fax from [facility] to clarify Clonidine directions, called Dr. Nyoka Cowden Clonidine 0.1mg  po twice daily, had Dr. Mariea Clonts sign and fax back to [facility]

## 2014-08-26 NOTE — Assessment & Plan Note (Signed)
Continue Spironolactone 25mg  daily, adding Furosemide 40mg  prn for weight gain 2-3/24hr or 3-5/week

## 2014-08-26 NOTE — Progress Notes (Signed)
Patient ID: [name], female   DOB: September 25, 1925, 81 y.o.   MRN: [id number]    [facility] Room Number: 237  Place of Service: Clinic (12)     Allergies  Allergen Reactions  . Sulfa Antibiotics Itching    Chief Complaint  Patient presents with  . Medical Management of Chronic Issues    blood pressure, weight loss, CHF  . Hospitalization Follow-up    in hospital 08/16/14 to 08/20/14 for Acute renal failure  . Edema    left ankle worse the right    HPI:  UTI (lower urinary tract infection): Hospitalization for E. Coli UTI and acute renal failure from 6/28-08/20/14. Notes reviewed. Completed 5 day course of Rocephin during hospitalization. No oral abx following d/c. Symptoms have completely resolved.   Acute renal failure superimposed on stage 3 chronic kidney disease: On admission K 5.9, BUN 93 Cr 5.15. Potassium has since normalized, BUN 36, Cr 2.78 on 08/20/14. Lisinopril and Lasix stopped during hospitalization  CO2 17 on 08/20/14, taking sodium bicarbonate twice a day until CO2 normalizes.  CVA (cerebral vascular accident): Residual right sided weakness, ambulates with walker. Gait instability.  Loss of weight: Previous weight loss of 20# over last year. Up 10# since 04/21/14 visit. Unsure that appetite has improved. Currently eating two good meals per day. Walking less than previously now that she is living in [place].  Dyspnea: Greatly improved, none at rest. Exertional dyspnea continues.   Essential hypertension: Quite elevated today at 192/86. Has been off Lisinopril and Lasix since hospitalization d/c. Denies headaches, chest pains or palpitations.   Anemia: Hgb 8.2 Hct 25.6 on 08/20/14. Denies increased fatigue.   Right knee pain: Pain with ambulation. Swollen. Hx osteoarthritis to knee. 4 weeks ago had effusion drained at Orthopedist, has been unsuccessful in setting up another appointment with him.  Medications: Patient's Medications  New  Prescriptions   No medications on file  Previous Medications   ACETAMINOPHEN (TYLENOL) 325 MG TABLET    Take 2 tablets (650 mg total) by mouth every 6 (six) hours as needed for mild pain (or Fever >/= 101).   ASPIRIN 81 MG TABLET    Take 81 mg by mouth daily.     ATORVASTATIN (LIPITOR) 20 MG TABLET    Take one tablet by mouth once daily for cholesterol   BETIMOL 0.5 % OPHTHALMIC SOLUTION    Place 1 drop into both eyes every morning.    CALCIUM CARBONATE (OS-CAL) 600 MG TABS    Take 600 mg by mouth daily.   CARVEDILOL (COREG) 3.125 MG TABLET    Take one tablet by mouth twice daily with a meal   CHOLECALCIFEROL (VITAMIN D3) 2000 UNITS TABS    Take 2,000 Units by mouth daily.     KLOR-CON M10 10 MEQ TABLET    TAKE 1 TABLET BY MOUTH EVERY DAY   LATANOPROST (XALATAN) 0.005 % OPHTHALMIC SOLUTION    Place 1 drop into both eyes at bedtime.    MIRTAZAPINE (REMERON) 7.5 MG TABLET    TAKE 1 TABLET (7.5 MG TOTAL) BY MOUTH AT BEDTIME. NIGHTLY FOR APPETITE   MULTIPLE VITAMIN (MULTIVITAMIN) CAPSULE    Take 1 capsule by mouth daily. 2000 units by mouth daily   OMEGA-3 FATTY ACIDS (FISH OIL) 1200 MG CAPS    Take 1,200 mg by mouth 2 (two) times daily.     OMEPRAZOLE (PRILOSEC) 20 MG CAPSULE    Take 20 mg by mouth daily.  PREGABALIN (LYRICA) 50 MG CAPSULE    Take 50 mg by mouth. Take one capsule daily   SODIUM BICARBONATE 650 MG TABLET    Take 1 tablet (650 mg total) by mouth 2 (two) times daily.   SPIRONOLACTONE (ALDACTONE) 25 MG TABLET    Take 1 tablet (25 mg total) by mouth daily.   VITAMIN B-12 (CYANOCOBALAMIN) 1000 MCG TABLET    Take 1,000 mcg by mouth 2 (two) times daily. Take one tablet by mouth twice daily on Mondays,Wednesdays and Fridays  Modified Medications   No medications on file  Discontinued Medications   No medications on file     Review of Systems  Constitutional:       Complains of generalized weakness  HENT: Positive for hearing loss. Negative for ear pain.   Eyes: Negative.     Respiratory: Negative.   Cardiovascular: Positive for leg swelling (ankles). Negative for chest pain and palpitations.  Gastrointestinal:       Complains of chronic mild right lower quadrant abdominal pain. History of diverticulosis.  Endocrine: Negative.   Genitourinary: Negative.   Musculoskeletal: Positive for back pain and arthralgias. Negative for myalgias, neck pain and neck stiffness.       Flat feet with dropped arches. Pin in the right medial malleolar bones. Chronic back pain. Chronic right shoulder pain. Uses 4 wheel walker.  Skin: Negative.   Neurological: Positive for weakness (Generalized).       Complaints of confusion earlier today  Hematological:       History of anemia.  Psychiatric/Behavioral: The patient is nervous/anxious.     Filed Vitals:   08/25/14 1433  BP: 192/86  Pulse: 68  Temp: 97.4 F (36.3 C)  TempSrc: Oral  Weight: 135 lb (61.236 kg)  SpO2: 93%   Body mass index is 21.8 kg/(m^2).  Physical Exam  Constitutional: She is oriented to person, place, and time. She appears well-developed and well-nourished. No distress.  HENT:  Head: Normocephalic and atraumatic.  Right Ear: External ear normal.  Left Ear: External ear normal.  Nose: Nose normal.  Mouth/Throat: Oropharynx is clear and moist. No oropharyngeal exudate.  Eyes: Conjunctivae and EOM are normal. Pupils are equal, round, and reactive to light. Right eye exhibits no discharge. Left eye exhibits no discharge. No scleral icterus.  Neck: Normal range of motion. Neck supple. No JVD present. No thyromegaly present.  Cardiovascular: Normal rate and regular rhythm.   Murmur heard.  Systolic murmur is present with a grade of 3/6  Murmur of aortic stenosis.  Pulmonary/Chest: Effort normal and breath sounds normal. No respiratory distress. She has no wheezes. She has no rales. She exhibits no tenderness.  Abdominal: Soft. Bowel sounds are normal. She exhibits no distension. There is no  tenderness. There is no rebound.  Musculoskeletal: Normal range of motion. She exhibits edema (1+  bipedal). She exhibits no tenderness.  Right shoulder discomfort with abduction and rotation. Right knee pain/edema with effusion, no warmth or erythema.  Lymphadenopathy:    She has no cervical adenopathy.  Neurological: She is alert and oriented to person, place, and time. She has normal reflexes. No cranial nerve deficit. She exhibits normal muscle tone. Coordination normal.  04/21/14 MMSE 28/30. Failed clock drawing. Mild stutter.  Skin: Skin is warm and dry. No rash noted. She is not diaphoretic. No erythema.  Psychiatric: She has a normal mood and affect. Her behavior is normal. Judgment and thought content normal.     Labs reviewed: Admission on 08/16/2014, Discharged  on 08/20/2014  Component Date Value Ref Range Status  . Sodium 08/16/2014 136  135 - 145 mmol/L Final  . Potassium 08/16/2014 5.9* 3.5 - 5.1 mmol/L Final  . Chloride 08/16/2014 112* 101 - 111 mmol/L Final  . CO2 08/16/2014 17* 22 - 32 mmol/L Final  . Glucose, Bld 08/16/2014 103* 65 - 99 mg/dL Final  . BUN 08/16/2014 93* 6 - 20 mg/dL Final  . Creatinine, Ser 08/16/2014 5.15* 0.44 - 1.00 mg/dL Final  . Calcium 08/16/2014 9.1  8.9 - 10.3 mg/dL Final  . Total Protein 08/16/2014 6.7  6.5 - 8.1 g/dL Final  . Albumin 08/16/2014 3.8  3.5 - 5.0 g/dL Final  . AST 08/16/2014 12* 15 - 41 U/L Final  . ALT 08/16/2014 8* 14 - 54 U/L Final  . Alkaline Phosphatase 08/16/2014 41  38 - 126 U/L Final  . Total Bilirubin 08/16/2014 0.6  0.3 - 1.2 mg/dL Final  . GFR calc non Af Amer 08/16/2014 7* >60 mL/min Final  . GFR calc Af Amer 08/16/2014 8* >60 mL/min Final   Comment: (NOTE) The eGFR has been calculated using the CKD EPI equation. This calculation has not been validated in all clinical situations. eGFR's persistently <60 mL/min signify possible Chronic Kidney Disease.   . Anion gap 08/16/2014 7  5 - 15 Final  . WBC 08/16/2014  6.0  4.0 - 10.5 K/uL Final  . RBC 08/16/2014 3.07* 3.87 - 5.11 MIL/uL Final  . Hemoglobin 08/16/2014 9.2* 12.0 - 15.0 g/dL Final  . HCT 08/16/2014 28.0* 36.0 - 46.0 % Final  . MCV 08/16/2014 91.2  78.0 - 100.0 fL Final  . MCH 08/16/2014 30.0  26.0 - 34.0 pg Final  . MCHC 08/16/2014 32.9  30.0 - 36.0 g/dL Final  . RDW 08/16/2014 14.9  11.5 - 15.5 % Final  . Platelets 08/16/2014 211  150 - 400 K/uL Final  . Neutrophils Relative % 08/16/2014 53  43 - 77 % Final  . Neutro Abs 08/16/2014 3.2  1.7 - 7.7 K/uL Final  . Lymphocytes Relative 08/16/2014 28  12 - 46 % Final  . Lymphs Abs 08/16/2014 1.7  0.7 - 4.0 K/uL Final  . Monocytes Relative 08/16/2014 13* 3 - 12 % Final  . Monocytes Absolute 08/16/2014 0.8  0.1 - 1.0 K/uL Final  . Eosinophils Relative 08/16/2014 6* 0 - 5 % Final  . Eosinophils Absolute 08/16/2014 0.4  0.0 - 0.7 K/uL Final  . Basophils Relative 08/16/2014 0  0 - 1 % Final  . Basophils Absolute 08/16/2014 0.0  0.0 - 0.1 K/uL Final  . Color, Urine 08/16/2014 YELLOW  YELLOW Final  . APPearance 08/16/2014 CLOUDY* CLEAR Final  . Specific Gravity, Urine 08/16/2014 1.009  1.005 - 1.030 Final  . pH 08/16/2014 5.5  5.0 - 8.0 Final  . Glucose, UA 08/16/2014 NEGATIVE  NEGATIVE mg/dL Final  . Hgb urine dipstick 08/16/2014 NEGATIVE  NEGATIVE Final  . Bilirubin Urine 08/16/2014 NEGATIVE  NEGATIVE Final  . Ketones, ur 08/16/2014 NEGATIVE  NEGATIVE mg/dL Final  . Protein, ur 08/16/2014 NEGATIVE  NEGATIVE mg/dL Final  . Urobilinogen, UA 08/16/2014 0.2  0.0 - 1.0 mg/dL Final  . Nitrite 08/16/2014 NEGATIVE  NEGATIVE Final  . Leukocytes, UA 08/16/2014 LARGE* NEGATIVE Final  . Specimen Description 08/16/2014 URINE, RANDOM   Final  . Special Requests 08/16/2014 NONE   Final  . Culture 08/16/2014    Final  Value:>=100,000 COLONIES/mL ESCHERICHIA COLI Performed at [facility]   . Report Status 08/16/2014 08/18/2014 FINAL   Final  . Organism ID, Bacteria 08/16/2014  ESCHERICHIA COLI   Final  . WBC, UA 08/16/2014 TOO NUMEROUS TO COUNT  <3 WBC/hpf Final  . Bacteria, UA 08/16/2014 MANY* RARE Final  . WBC 08/16/2014 5.4  4.0 - 10.5 K/uL Final  . RBC 08/16/2014 3.03* 3.87 - 5.11 MIL/uL Final  . Hemoglobin 08/16/2014 9.3* 12.0 - 15.0 g/dL Final  . HCT 08/16/2014 27.5* 36.0 - 46.0 % Final  . MCV 08/16/2014 90.8  78.0 - 100.0 fL Final  . MCH 08/16/2014 30.7  26.0 - 34.0 pg Final  . MCHC 08/16/2014 33.8  30.0 - 36.0 g/dL Final  . RDW 08/16/2014 14.8  11.5 - 15.5 % Final  . Platelets 08/16/2014 209  150 - 400 K/uL Final  . Creatinine, Ser 08/16/2014 4.79* 0.44 - 1.00 mg/dL Final  . GFR calc non Af Amer 08/16/2014 7* >60 mL/min Final  . GFR calc Af Amer 08/16/2014 9* >60 mL/min Final   Comment: (NOTE) The eGFR has been calculated using the CKD EPI equation. This calculation has not been validated in all clinical situations. eGFR's persistently <60 mL/min signify possible Chronic Kidney Disease.   Marland Kitchen TSH 08/16/2014 2.227  0.350 - 4.500 uIU/mL Final  . Hgb A1c MFr Bld 08/16/2014 6.1* 4.8 - 5.6 % Final   Comment: (NOTE)         Pre-diabetes: 5.7 - 6.4         Diabetes: >6.4         Glycemic control for adults with diabetes: <7.0   . Mean Plasma Glucose 08/16/2014 128   Final   Comment: (NOTE) Performed At: [facility] [place], [place] [id number] Lindon Romp MD DQ:[id number]   . WBC 08/17/2014 5.9  4.0 - 10.5 K/uL Final  . RBC 08/17/2014 2.85* 3.87 - 5.11 MIL/uL Final  . Hemoglobin 08/17/2014 8.5* 12.0 - 15.0 g/dL Final  . HCT 08/17/2014 25.9* 36.0 - 46.0 % Final  . MCV 08/17/2014 90.9  78.0 - 100.0 fL Final  . MCH 08/17/2014 29.8  26.0 - 34.0 pg Final  . MCHC 08/17/2014 32.8  30.0 - 36.0 g/dL Final  . RDW 08/17/2014 15.0  11.5 - 15.5 % Final  . Platelets 08/17/2014 186  150 - 400 K/uL Final  . Sodium 08/17/2014 142  135 - 145 mmol/L Final  . Potassium 08/17/2014 5.3* 3.5 - 5.1 mmol/L Final  . Chloride 08/17/2014 118*  101 - 111 mmol/L Final  . CO2 08/17/2014 17* 22 - 32 mmol/L Final  . Glucose, Bld 08/17/2014 89  65 - 99 mg/dL Final  . BUN 08/17/2014 86* 6 - 20 mg/dL Final  . Creatinine, Ser 08/17/2014 4.45* 0.44 - 1.00 mg/dL Final  . Calcium 08/17/2014 8.6* 8.9 - 10.3 mg/dL Final  . Total Protein 08/17/2014 5.9* 6.5 - 8.1 g/dL Final  . Albumin 08/17/2014 3.2* 3.5 - 5.0 g/dL Final  . AST 08/17/2014 11* 15 - 41 U/L Final  . ALT 08/17/2014 8* 14 - 54 U/L Final  . Alkaline Phosphatase 08/17/2014 34* 38 - 126 U/L Final  . Total Bilirubin 08/17/2014 0.5  0.3 - 1.2 mg/dL Final  . GFR calc non Af Amer 08/17/2014 8* >60 mL/min Final  . GFR calc Af Amer 08/17/2014 9* >60 mL/min Final   Comment: (NOTE) The eGFR has been calculated using the CKD EPI equation. This calculation has not  been validated in all clinical situations. eGFR's persistently <60 mL/min signify possible Chronic Kidney Disease.   . Anion gap 08/17/2014 7  5 - 15 Final  . Prothrombin Time 08/17/2014 15.6* 11.6 - 15.2 seconds Final  . INR 08/17/2014 1.23  0.00 - 1.49 Final  . Iron 08/16/2014 89  28 - 170 ug/dL Final  . TIBC 08/16/2014 297  250 - 450 ug/dL Final  . Saturation Ratios 08/16/2014 30  10.4 - 31.8 % Final  . UIBC 08/16/2014 208   Final   Performed at [facility]  . Ferritin 08/16/2014 76  11 - 307 ng/mL Final   Performed at [facility]  . Vitamin B-12 08/16/2014 1080* 180 - 914 pg/mL Final   Comment: (NOTE) This assay is not validated for testing neonatal or myeloproliferative syndrome specimens for Vitamin B12 levels. Performed at [facility]   . Folate, Hemolysate 08/16/2014 588.6  Not Estab. ng/mL Final  . Hematocrit 08/16/2014 27.6* 34.0 - 46.6 % Final  . Folate, RBC 08/16/2014 2133  >498 ng/mL Final   Comment: (NOTE) Performed At: [facility] [place], [place] [id number] Lindon Romp MD WE:[id number]   . Glucose-Capillary 08/17/2014 85  65 - 99 mg/dL Final  .  Fecal Occult Bld 08/17/2014 POSITIVE* NEGATIVE Final  . WBC 08/18/2014 5.1  4.0 - 10.5 K/uL Final  . RBC 08/18/2014 2.80* 3.87 - 5.11 MIL/uL Final  . Hemoglobin 08/18/2014 8.2* 12.0 - 15.0 g/dL Final  . HCT 08/18/2014 25.6* 36.0 - 46.0 % Final  . MCV 08/18/2014 91.4  78.0 - 100.0 fL Final  . MCH 08/18/2014 29.3  26.0 - 34.0 pg Final  . MCHC 08/18/2014 32.0  30.0 - 36.0 g/dL Final  . RDW 08/18/2014 14.9  11.5 - 15.5 % Final  . Platelets 08/18/2014 186  150 - 400 K/uL Final  . Sodium 08/18/2014 141  135 - 145 mmol/L Final  . Potassium 08/18/2014 4.2  3.5 - 5.1 mmol/L Final   Comment: RESULT REPEATED AND VERIFIED DELTA CHECK NOTED   . Chloride 08/18/2014 118* 101 - 111 mmol/L Final  . CO2 08/18/2014 15* 22 - 32 mmol/L Final  . Glucose, Bld 08/18/2014 88  65 - 99 mg/dL Final  . BUN 08/18/2014 59* 6 - 20 mg/dL Final  . Creatinine, Ser 08/18/2014 3.56* 0.44 - 1.00 mg/dL Final  . Calcium 08/18/2014 8.6* 8.9 - 10.3 mg/dL Final  . GFR calc non Af Amer 08/18/2014 11* >60 mL/min Final  . GFR calc Af Amer 08/18/2014 12* >60 mL/min Final   Comment: (NOTE) The eGFR has been calculated using the CKD EPI equation. This calculation has not been validated in all clinical situations. eGFR's persistently <60 mL/min signify possible Chronic Kidney Disease.   . Anion gap 08/18/2014 8  5 - 15 Final  . Glucose-Capillary 08/18/2014 79  65 - 99 mg/dL Final  . Sodium 08/19/2014 143  135 - 145 mmol/L Final  . Potassium 08/19/2014 4.2  3.5 - 5.1 mmol/L Final  . Chloride 08/19/2014 119* 101 - 111 mmol/L Final  . CO2 08/19/2014 16* 22 - 32 mmol/L Final  . Glucose, Bld 08/19/2014 93  65 - 99 mg/dL Final  . BUN 08/19/2014 47* 6 - 20 mg/dL Final  . Creatinine, Ser 08/19/2014 2.87* 0.44 - 1.00 mg/dL Final  . Calcium 08/19/2014 8.7* 8.9 - 10.3 mg/dL Final  . GFR calc non Af Amer 08/19/2014 14* >60 mL/min Final  . GFR calc Af Wyvonnia Lora 08/19/2014  16* >60 mL/min Final   Comment: (NOTE) The eGFR has been calculated  using the CKD EPI equation. This calculation has not been validated in all clinical situations. eGFR's persistently <60 mL/min signify possible Chronic Kidney Disease.   . Anion gap 08/19/2014 8  5 - 15 Final  . Glucose-Capillary 08/19/2014 89  65 - 99 mg/dL Final  . Sodium 08/20/2014 142  135 - 145 mmol/L Final  . Potassium 08/20/2014 4.0  3.5 - 5.1 mmol/L Final  . Chloride 08/20/2014 115* 101 - 111 mmol/L Final  . CO2 08/20/2014 17* 22 - 32 mmol/L Final  . Glucose, Bld 08/20/2014 89  65 - 99 mg/dL Final  . BUN 08/20/2014 36* 6 - 20 mg/dL Final  . Creatinine, Ser 08/20/2014 2.78* 0.44 - 1.00 mg/dL Final  . Calcium 08/20/2014 8.6* 8.9 - 10.3 mg/dL Final  . GFR calc non Af Amer 08/20/2014 14* >60 mL/min Final  . GFR calc Af Amer 08/20/2014 17* >60 mL/min Final   Comment: (NOTE) The eGFR has been calculated using the CKD EPI equation. This calculation has not been validated in all clinical situations. eGFR's persistently <60 mL/min signify possible Chronic Kidney Disease.   . Anion gap 08/20/2014 10  5 - 15 Final  . Glucose-Capillary 08/20/2014 86  65 - 99 mg/dL Final  . Comment 1 08/20/2014 Notify RN   Final  . Comment 2 08/20/2014 Document in Chart   Final     Assessment/Plan 1. UTI (lower urinary tract infection) Resolved.  2. Acute renal failure superimposed on stage 3 chronic kidney disease Push po fluids Discontinue Spironolactone and K Dur  3. CVA (cerebral vascular accident) Stable  4. Loss of weight Improved  6. Dyspnea Stable  8. Essential hypertension Start Clonidine  9. Anemia Normocytic, normochromic anemia. Likely d/t renal insufficiency -CBC  10. Edema - Have stopped Lasix and Spironolactone - Will observe for worsening  11. Right knee pain -effusion - refer to orthopedist

## 2014-08-26 NOTE — Assessment & Plan Note (Signed)
Creatinine down from 4s to 2s during hospitalization.

## 2014-08-26 NOTE — Assessment & Plan Note (Signed)
E-coli, fully treated upon hospital discharge 08/20/14. Asymptomatic today.

## 2014-08-26 NOTE — Assessment & Plan Note (Signed)
Controlled, takes Carvedilol 3.125mg  bid.

## 2014-08-26 NOTE — Assessment & Plan Note (Signed)
takes Lyrica 50mg  qd for back and BLE neuropathic pain.

## 2014-08-26 NOTE — Assessment & Plan Note (Signed)
Worse in R+L ankles, chronic cough, denied phlegm production, SOB, chest pain, or palpitation. Continue Spironolactone 25mg  daily. Will weight daily, Furosemide 40mg  daily prn for wt gain 2-3 Ibs/24hrs or 3-5Ibs/week. Update BMP in one week.

## 2014-08-30 LAB — CBC AND DIFFERENTIAL
HEMATOCRIT: 23 % — AB (ref 36–46)
HEMOGLOBIN: 7.7 g/dL — AB (ref 12.0–16.0)
PLATELETS: 171 10*3/uL (ref 150–399)
WBC: 6 10^3/mL

## 2014-08-30 LAB — HEPATIC FUNCTION PANEL
ALT: 8 U/L (ref 7–35)
AST: 14 U/L (ref 13–35)
Alkaline Phosphatase: 37 U/L (ref 25–125)
Bilirubin, Total: 0.3 mg/dL

## 2014-08-30 LAB — BASIC METABOLIC PANEL
BUN: 33 mg/dL — AB (ref 4–21)
Creatinine: 3 mg/dL — AB (ref 0.5–1.1)
GLUCOSE: 82 mg/dL
Potassium: 3.9 mmol/L (ref 3.4–5.3)
Sodium: 143 mmol/L (ref 137–147)

## 2014-08-31 ENCOUNTER — Other Ambulatory Visit: Payer: Self-pay | Admitting: Nurse Practitioner

## 2014-08-31 DIAGNOSIS — R609 Edema, unspecified: Secondary | ICD-10-CM

## 2014-08-31 DIAGNOSIS — N179 Acute kidney failure, unspecified: Secondary | ICD-10-CM

## 2014-08-31 DIAGNOSIS — N189 Chronic kidney disease, unspecified: Secondary | ICD-10-CM

## 2014-08-31 DIAGNOSIS — N289 Disorder of kidney and ureter, unspecified: Secondary | ICD-10-CM

## 2014-08-31 DIAGNOSIS — E875 Hyperkalemia: Secondary | ICD-10-CM

## 2014-08-31 DIAGNOSIS — D631 Anemia in chronic kidney disease: Secondary | ICD-10-CM

## 2014-08-31 NOTE — Assessment & Plan Note (Signed)
08/25/14 off Spironolactone 08/26/14 Furosemide 40mg  08/30/14 Bun 33, creatinine 3.00, CBC CMP prior to next appoint.

## 2014-09-06 LAB — BASIC METABOLIC PANEL
BUN: 29 mg/dL — AB (ref 4–21)
CREATININE: 2.8 mg/dL — AB (ref 0.5–1.1)
Potassium: 3.6 mmol/L (ref 3.4–5.3)
SODIUM: 143 mmol/L (ref 137–147)

## 2014-09-06 LAB — HEPATIC FUNCTION PANEL
ALK PHOS: 41 U/L (ref 25–125)
ALT: 8 U/L (ref 7–35)
AST: 16 U/L (ref 13–35)
Bilirubin, Total: 0.4 mg/dL

## 2014-09-06 LAB — CBC AND DIFFERENTIAL
HCT: 26 % — AB (ref 36–46)
HEMOGLOBIN: 8.3 g/dL — AB (ref 12.0–16.0)
PLATELETS: 195 10*3/uL (ref 150–399)
WBC: 5.3 10^3/mL

## 2014-09-08 ENCOUNTER — Non-Acute Institutional Stay: Payer: Medicare Other | Admitting: Internal Medicine

## 2014-09-08 ENCOUNTER — Encounter: Payer: Self-pay | Admitting: Internal Medicine

## 2014-09-08 VITALS — BP 140/82 | HR 68 | Temp 97.6°F | Wt 137.0 lb

## 2014-09-08 DIAGNOSIS — I10 Essential (primary) hypertension: Secondary | ICD-10-CM

## 2014-09-08 DIAGNOSIS — N289 Disorder of kidney and ureter, unspecified: Secondary | ICD-10-CM

## 2014-09-08 DIAGNOSIS — N189 Chronic kidney disease, unspecified: Secondary | ICD-10-CM

## 2014-09-08 DIAGNOSIS — N393 Stress incontinence (female) (male): Secondary | ICD-10-CM

## 2014-09-08 DIAGNOSIS — N179 Acute kidney failure, unspecified: Secondary | ICD-10-CM | POA: Diagnosis not present

## 2014-09-08 DIAGNOSIS — M25561 Pain in right knee: Secondary | ICD-10-CM | POA: Diagnosis not present

## 2014-09-08 DIAGNOSIS — D631 Anemia in chronic kidney disease: Secondary | ICD-10-CM | POA: Diagnosis not present

## 2014-09-08 DIAGNOSIS — R0600 Dyspnea, unspecified: Secondary | ICD-10-CM

## 2014-09-08 DIAGNOSIS — I5032 Chronic diastolic (congestive) heart failure: Secondary | ICD-10-CM

## 2014-09-08 DIAGNOSIS — R609 Edema, unspecified: Secondary | ICD-10-CM | POA: Diagnosis not present

## 2014-09-18 ENCOUNTER — Encounter: Payer: Self-pay | Admitting: Internal Medicine

## 2014-09-18 DIAGNOSIS — N393 Stress incontinence (female) (male): Secondary | ICD-10-CM

## 2014-09-18 HISTORY — DX: Stress incontinence (female) (male): N39.3

## 2014-09-18 MED ORDER — OXYBUTYNIN CHLORIDE 5 MG PO TABS: ORAL_TABLET | ORAL | Status: DC

## 2014-09-18 NOTE — Progress Notes (Signed)
Patient ID: [name], female   DOB: [birth year], 81 y.o.   MRN: [id number]    [place] Room Number: 366  Place of Service: Clinic (12)    Allergies  Allergen Reactions  . Sulfa Antibiotics Itching    Chief Complaint  Patient presents with  . Medical Management of Chronic Issues    2 week follow-up on ankle edema    HPI:   Patient has been using furosemide 40 mg daily as well as spironolactone for her edema. Seems a little improved. She is also using sodium bicarbonate tablets.  She has a previous history of congestive heart failure.  There is evidence of an iron deficiency anemia. She will be started on ferrous sulfate.  Chronic kidney disease is also present and seems to be relatively stable. On 08/16/2014, BUN was 93 and creatinine 5.15. On 09/06/2014 UN and following 29 and creatinine 2.79. Spironolactone has been discontinued in the meantime.  Elevated blood pressures have been noted. 08/26/2014 blood pressure was 192/86. Lisinopril was resumed.  Patient has been incontinent and requests something to assist in improving the situation.  Patient has had problems of right knee pain and left arm pains. She has seen Dr. Veverly Fells.  Medications: Patient's Medications  New Prescriptions   No medications on file  Previous Medications   ACETAMINOPHEN (TYLENOL) 325 MG TABLET    Take 2 tablets (650 mg total) by mouth every 6 (six) hours as needed for mild pain (or Fever >/= 101).   ATORVASTATIN (LIPITOR) 20 MG TABLET    Take one tablet by mouth once daily for cholesterol   BETIMOL 0.5 % OPHTHALMIC SOLUTION    Place 1 drop into both eyes every morning.    CARVEDILOL (COREG) 3.125 MG TABLET    Take one tablet by mouth twice daily with a meal   CLONIDINE (CATAPRES) 0.1 MG TABLET    Take 0.1 mg by mouth. Take one tablet by mouth twice daily   FERROUS SULFATE 325 (65 FE) MG TABLET    Take 325 mg by mouth daily with breakfast.   FUROSEMIDE (LASIX)  40 MG TABLET    Take one as needed for weight increase 2-3 lbs in 24 hrs.  Or 3-5 lbs in a week   LATANOPROST (XALATAN) 0.005 % OPHTHALMIC SOLUTION    Place 1 drop into both eyes at bedtime.    MIRTAZAPINE (REMERON) 7.5 MG TABLET    TAKE 1 TABLET (7.5 MG TOTAL) BY MOUTH AT BEDTIME. NIGHTLY FOR APPETITE   OMEPRAZOLE (PRILOSEC) 20 MG CAPSULE    Take 20 mg by mouth daily.   PREGABALIN (LYRICA) 50 MG CAPSULE    Take 50 mg by mouth. Take one capsule daily   SODIUM BICARBONATE 650 MG TABLET    Take 1 tablet (650 mg total) by mouth 2 (two) times daily.   VITAMIN B-12 (CYANOCOBALAMIN) 1000 MCG TABLET    Take 1,000 mcg by mouth 2 (two) times daily. Take one tablet by mouth twice daily on Mondays,Wednesdays and Fridays  Modified Medications   No medications on file  Discontinued Medications   ASPIRIN 81 MG TABLET    Take 81 mg by mouth daily.     CALCIUM CARBONATE (OS-CAL) 600 MG TABS    Take 600 mg by mouth daily.   CHOLECALCIFEROL (VITAMIN D3) 2000 UNITS TABS    Take 2,000 Units by mouth daily.     MULTIPLE VITAMIN (MULTIVITAMIN) CAPSULE    Take 1 capsule by mouth  daily. 2000 units by mouth daily   OMEGA-3 FATTY ACIDS (FISH OIL) 1200 MG CAPS    Take 1,200 mg by mouth 2 (two) times daily.       Review of Systems  Constitutional:       Complains of generalized weakness  HENT: Positive for hearing loss. Negative for ear pain.   Eyes: Negative.   Respiratory: Negative.   Cardiovascular: Positive for leg swelling (ankles). Negative for chest pain and palpitations.       Increased edema R+L ankles  Gastrointestinal:       Previous complaints of chronic mild right lower quadrant abdominal pain. History of diverticulosis. Now doing better. She denies any abdominal pain.  Endocrine: Negative.   Genitourinary: Negative.   Musculoskeletal: Positive for back pain and arthralgias (right knee. Left shoulder.). Negative for myalgias, neck pain and neck stiffness.       Flat feet with dropped arches. Pin in  the right medial malleolar bones. Chronic back pain. Chronic right shoulder pain. Right knee pain. Uses 4 wheel walker.  Skin: Negative.   Neurological: Positive for weakness (Generalized).       Complaints of confusion earlier today  Hematological:       History of anemia.  Psychiatric/Behavioral: The patient is nervous/anxious.     Filed Vitals:   09/08/14 1446  BP: 140/82  Pulse: 68  Temp: 97.6 F (36.4 C)  TempSrc: Oral  Weight: 137 lb (62.143 kg)  SpO2: 91%   Body mass index is 22.12 kg/(m^2).  Physical Exam  Constitutional: She is oriented to person, place, and time. She appears well-developed and well-nourished. No distress.  HENT:  Head: Normocephalic and atraumatic.  Right Ear: External ear normal.  Left Ear: External ear normal.  Nose: Nose normal.  Mouth/Throat: Oropharynx is clear and moist. No oropharyngeal exudate.  Eyes: Conjunctivae and EOM are normal. Pupils are equal, round, and reactive to light. Right eye exhibits no discharge. Left eye exhibits no discharge. No scleral icterus.  Neck: Normal range of motion. Neck supple. No JVD present. No thyromegaly present.  Cardiovascular: Normal rate and regular rhythm.   Murmur heard.  Systolic murmur is present with a grade of 2/6  Pulmonary/Chest: Effort normal and breath sounds normal. No respiratory distress. She has no wheezes. She has no rales. She exhibits no tenderness.  Abdominal: Soft. Bowel sounds are normal. She exhibits no distension. There is no tenderness. There is no rebound.  Musculoskeletal: Normal range of motion. She exhibits edema (1+  bipedal). She exhibits no tenderness.  R+L ankle edema.  Right knee deformity with osteoarthritic changes.  Lymphadenopathy:    She has no cervical adenopathy.  Neurological: She is alert and oriented to person, place, and time. She has normal reflexes. No cranial nerve deficit. She exhibits normal muscle tone. Coordination normal.  04/21/14 MMSE 28/30. Failed  clock drawing. Mild stutter.  Skin: Skin is warm and dry. No rash noted. She is not diaphoretic. No erythema.  Psychiatric: She has a normal mood and affect. Her behavior is normal. Judgment and thought content normal.     Labs reviewed: Nursing Home on 09/08/2014  Component Date Value Ref Range Status  . Hemoglobin 09/06/2014 8.3* 12.0 - 16.0 g/dL Final  . HCT 09/06/2014 26* 36 - 46 % Final  . Platelets 09/06/2014 195  150 - 399 K/L Final  . WBC 09/06/2014 5.3   Final  . BUN 09/06/2014 29* 4 - 21 mg/dL Final  . Creatinine 09/06/2014 2.8* 0.5 -  1.1 mg/dL Final  . Potassium 09/06/2014 3.6  3.4 - 5.3 mmol/L Final  . Sodium 09/06/2014 143  137 - 147 mmol/L Final  . Alkaline Phosphatase 09/06/2014 41  25 - 125 U/L Final  . ALT 09/06/2014 8  7 - 35 U/L Final  . AST 09/06/2014 16  13 - 35 U/L Final  . Bilirubin, Total 09/06/2014 0.4   Final  Lab on 08/31/2014  Component Date Value Ref Range Status  . Hemoglobin 08/30/2014 7.7* 12.0 - 16.0 g/dL Final  . HCT 08/30/2014 23* 36 - 46 % Final  . Platelets 08/30/2014 171  150 - 399 K/L Final  . WBC 08/30/2014 6.0   Final  . Glucose 08/30/2014 82   Final  . BUN 08/30/2014 33* 4 - 21 mg/dL Final  . Creatinine 08/30/2014 3.0* 0.5 - 1.1 mg/dL Final  . Potassium 08/30/2014 3.9  3.4 - 5.3 mmol/L Final  . Sodium 08/30/2014 143  137 - 147 mmol/L Final  . Alkaline Phosphatase 08/30/2014 37  25 - 125 U/L Final  . ALT 08/30/2014 8  7 - 35 U/L Final  . AST 08/30/2014 14  13 - 35 U/L Final  . Bilirubin, Total 08/30/2014 0.3   Final  Admission on 08/16/2014, Discharged on 08/20/2014  Component Date Value Ref Range Status  . Sodium 08/16/2014 136  135 - 145 mmol/L Final  . Potassium 08/16/2014 5.9* 3.5 - 5.1 mmol/L Final  . Chloride 08/16/2014 112* 101 - 111 mmol/L Final  . CO2 08/16/2014 17* 22 - 32 mmol/L Final  . Glucose, Bld 08/16/2014 103* 65 - 99 mg/dL Final  . BUN 08/16/2014 93* 6 - 20 mg/dL Final  . Creatinine, Ser 08/16/2014 5.15* 0.44 -  1.00 mg/dL Final  . Calcium 08/16/2014 9.1  8.9 - 10.3 mg/dL Final  . Total Protein 08/16/2014 6.7  6.5 - 8.1 g/dL Final  . Albumin 08/16/2014 3.8  3.5 - 5.0 g/dL Final  . AST 08/16/2014 12* 15 - 41 U/L Final  . ALT 08/16/2014 8* 14 - 54 U/L Final  . Alkaline Phosphatase 08/16/2014 41  38 - 126 U/L Final  . Total Bilirubin 08/16/2014 0.6  0.3 - 1.2 mg/dL Final  . GFR calc non Af Amer 08/16/2014 7* >60 mL/min Final  . GFR calc Af Amer 08/16/2014 8* >60 mL/min Final   Comment: (NOTE) The eGFR has been calculated using the CKD EPI equation. This calculation has not been validated in all clinical situations. eGFR's persistently <60 mL/min signify possible Chronic Kidney Disease.   . Anion gap 08/16/2014 7  5 - 15 Final  . WBC 08/16/2014 6.0  4.0 - 10.5 K/uL Final  . RBC 08/16/2014 3.07* 3.87 - 5.11 MIL/uL Final  . Hemoglobin 08/16/2014 9.2* 12.0 - 15.0 g/dL Final  . HCT 08/16/2014 28.0* 36.0 - 46.0 % Final  . MCV 08/16/2014 91.2  78.0 - 100.0 fL Final  . MCH 08/16/2014 30.0  26.0 - 34.0 pg Final  . MCHC 08/16/2014 32.9  30.0 - 36.0 g/dL Final  . RDW 08/16/2014 14.9  11.5 - 15.5 % Final  . Platelets 08/16/2014 211  150 - 400 K/uL Final  . Neutrophils Relative % 08/16/2014 53  43 - 77 % Final  . Neutro Abs 08/16/2014 3.2  1.7 - 7.7 K/uL Final  . Lymphocytes Relative 08/16/2014 28  12 - 46 % Final  . Lymphs Abs 08/16/2014 1.7  0.7 - 4.0 K/uL Final  . Monocytes Relative 08/16/2014 13* 3 - 12 %  Final  . Monocytes Absolute 08/16/2014 0.8  0.1 - 1.0 K/uL Final  . Eosinophils Relative 08/16/2014 6* 0 - 5 % Final  . Eosinophils Absolute 08/16/2014 0.4  0.0 - 0.7 K/uL Final  . Basophils Relative 08/16/2014 0  0 - 1 % Final  . Basophils Absolute 08/16/2014 0.0  0.0 - 0.1 K/uL Final  . Color, Urine 08/16/2014 YELLOW  YELLOW Final  . APPearance 08/16/2014 CLOUDY* CLEAR Final  . Specific Gravity, Urine 08/16/2014 1.009  1.005 - 1.030 Final  . pH 08/16/2014 5.5  5.0 - 8.0 Final  . Glucose, UA  08/16/2014 NEGATIVE  NEGATIVE mg/dL Final  . Hgb urine dipstick 08/16/2014 NEGATIVE  NEGATIVE Final  . Bilirubin Urine 08/16/2014 NEGATIVE  NEGATIVE Final  . Ketones, ur 08/16/2014 NEGATIVE  NEGATIVE mg/dL Final  . Protein, ur 08/16/2014 NEGATIVE  NEGATIVE mg/dL Final  . Urobilinogen, UA 08/16/2014 0.2  0.0 - 1.0 mg/dL Final  . Nitrite 08/16/2014 NEGATIVE  NEGATIVE Final  . Leukocytes, UA 08/16/2014 LARGE* NEGATIVE Final  . Specimen Description 08/16/2014 URINE, RANDOM   Final  . Special Requests 08/16/2014 NONE   Final  . Culture 08/16/2014    Final                   Value:>=100,000 COLONIES/mL ESCHERICHIA COLI Performed at [facility]   . Report Status 08/16/2014 08/18/2014 FINAL   Final  . Organism ID, Bacteria 08/16/2014 ESCHERICHIA COLI   Final  . WBC, UA 08/16/2014 TOO NUMEROUS TO COUNT  <3 WBC/hpf Final  . Bacteria, UA 08/16/2014 MANY* RARE Final  . WBC 08/16/2014 5.4  4.0 - 10.5 K/uL Final  . RBC 08/16/2014 3.03* 3.87 - 5.11 MIL/uL Final  . Hemoglobin 08/16/2014 9.3* 12.0 - 15.0 g/dL Final  . HCT 08/16/2014 27.5* 36.0 - 46.0 % Final  . MCV 08/16/2014 90.8  78.0 - 100.0 fL Final  . MCH 08/16/2014 30.7  26.0 - 34.0 pg Final  . MCHC 08/16/2014 33.8  30.0 - 36.0 g/dL Final  . RDW 08/16/2014 14.8  11.5 - 15.5 % Final  . Platelets 08/16/2014 209  150 - 400 K/uL Final  . Creatinine, Ser 08/16/2014 4.79* 0.44 - 1.00 mg/dL Final  . GFR calc non Af Amer 08/16/2014 7* >60 mL/min Final  . GFR calc Af Amer 08/16/2014 9* >60 mL/min Final   Comment: (NOTE) The eGFR has been calculated using the CKD EPI equation. This calculation has not been validated in all clinical situations. eGFR's persistently <60 mL/min signify possible Chronic Kidney Disease.   Marland Kitchen TSH 08/16/2014 2.227  0.350 - 4.500 uIU/mL Final  . Hgb A1c MFr Bld 08/16/2014 6.1* 4.8 - 5.6 % Final   Comment: (NOTE)         Pre-diabetes: 5.7 - 6.4         Diabetes: >6.4         Glycemic control for adults with diabetes:  <7.0   . Mean Plasma Glucose 08/16/2014 128   Final   Comment: (NOTE) Performed At: [facility] [place], [place] [id number] Lindon Romp MD VF:[id number]   . WBC 08/17/2014 5.9  4.0 - 10.5 K/uL Final  . RBC 08/17/2014 2.85* 3.87 - 5.11 MIL/uL Final  . Hemoglobin 08/17/2014 8.5* 12.0 - 15.0 g/dL Final  . HCT 08/17/2014 25.9* 36.0 - 46.0 % Final  . MCV 08/17/2014 90.9  78.0 - 100.0 fL Final  . MCH 08/17/2014 29.8  26.0 - 34.0 pg Final  .  MCHC 08/17/2014 32.8  30.0 - 36.0 g/dL Final  . RDW 08/17/2014 15.0  11.5 - 15.5 % Final  . Platelets 08/17/2014 186  150 - 400 K/uL Final  . Sodium 08/17/2014 142  135 - 145 mmol/L Final  . Potassium 08/17/2014 5.3* 3.5 - 5.1 mmol/L Final  . Chloride 08/17/2014 118* 101 - 111 mmol/L Final  . CO2 08/17/2014 17* 22 - 32 mmol/L Final  . Glucose, Bld 08/17/2014 89  65 - 99 mg/dL Final  . BUN 08/17/2014 86* 6 - 20 mg/dL Final  . Creatinine, Ser 08/17/2014 4.45* 0.44 - 1.00 mg/dL Final  . Calcium 08/17/2014 8.6* 8.9 - 10.3 mg/dL Final  . Total Protein 08/17/2014 5.9* 6.5 - 8.1 g/dL Final  . Albumin 08/17/2014 3.2* 3.5 - 5.0 g/dL Final  . AST 08/17/2014 11* 15 - 41 U/L Final  . ALT 08/17/2014 8* 14 - 54 U/L Final  . Alkaline Phosphatase 08/17/2014 34* 38 - 126 U/L Final  . Total Bilirubin 08/17/2014 0.5  0.3 - 1.2 mg/dL Final  . GFR calc non Af Amer 08/17/2014 8* >60 mL/min Final  . GFR calc Af Amer 08/17/2014 9* >60 mL/min Final   Comment: (NOTE) The eGFR has been calculated using the CKD EPI equation. This calculation has not been validated in all clinical situations. eGFR's persistently <60 mL/min signify possible Chronic Kidney Disease.   . Anion gap 08/17/2014 7  5 - 15 Final  . Prothrombin Time 08/17/2014 15.6* 11.6 - 15.2 seconds Final  . INR 08/17/2014 1.23  0.00 - 1.49 Final  . Iron 08/16/2014 89  28 - 170 ug/dL Final  . TIBC 08/16/2014 297  250 - 450 ug/dL Final  . Saturation Ratios 08/16/2014 30  10.4 - 31.8  % Final  . UIBC 08/16/2014 208   Final   Performed at [facility]  . Ferritin 08/16/2014 76  11 - 307 ng/mL Final   Performed at [facility]  . Vitamin B-12 08/16/2014 1080* 180 - 914 pg/mL Final   Comment: (NOTE) This assay is not validated for testing neonatal or myeloproliferative syndrome specimens for Vitamin B12 levels. Performed at [facility]   . Folate, Hemolysate 08/16/2014 588.6  Not Estab. ng/mL Final  . Hematocrit 08/16/2014 27.6* 34.0 - 46.6 % Final  . Folate, RBC 08/16/2014 2133  >498 ng/mL Final   Comment: (NOTE) Performed At: [facility] [place], [place] [id number] Lindon Romp MD [id number]   . Glucose-Capillary 08/17/2014 85  65 - 99 mg/dL Final  . Fecal Occult Bld 08/17/2014 POSITIVE* NEGATIVE Final  . WBC 08/18/2014 5.1  4.0 - 10.5 K/uL Final  . RBC 08/18/2014 2.80* 3.87 - 5.11 MIL/uL Final  . Hemoglobin 08/18/2014 8.2* 12.0 - 15.0 g/dL Final  . HCT 08/18/2014 25.6* 36.0 - 46.0 % Final  . MCV 08/18/2014 91.4  78.0 - 100.0 fL Final  . MCH 08/18/2014 29.3  26.0 - 34.0 pg Final  . MCHC 08/18/2014 32.0  30.0 - 36.0 g/dL Final  . RDW 08/18/2014 14.9  11.5 - 15.5 % Final  . Platelets 08/18/2014 186  150 - 400 K/uL Final  . Sodium 08/18/2014 141  135 - 145 mmol/L Final  . Potassium 08/18/2014 4.2  3.5 - 5.1 mmol/L Final   Comment: RESULT REPEATED AND VERIFIED DELTA CHECK NOTED   . Chloride 08/18/2014 118* 101 - 111 mmol/L Final  . CO2 08/18/2014 15* 22 - 32 mmol/L Final  . Glucose, Bld 08/18/2014 88  65 - 99 mg/dL Final  . BUN 08/18/2014 59* 6 - 20 mg/dL Final  . Creatinine, Ser 08/18/2014 3.56* 0.44 - 1.00 mg/dL Final  . Calcium 08/18/2014 8.6* 8.9 - 10.3 mg/dL Final  . GFR calc non Af Amer 08/18/2014 11* >60 mL/min Final  . GFR calc Af Amer 08/18/2014 12* >60 mL/min Final   Comment: (NOTE) The eGFR has been calculated using the CKD EPI equation. This calculation has not been validated in all clinical  situations. eGFR's persistently <60 mL/min signify possible Chronic Kidney Disease.   . Anion gap 08/18/2014 8  5 - 15 Final  . Glucose-Capillary 08/18/2014 79  65 - 99 mg/dL Final  . Sodium 08/19/2014 143  135 - 145 mmol/L Final  . Potassium 08/19/2014 4.2  3.5 - 5.1 mmol/L Final  . Chloride 08/19/2014 119* 101 - 111 mmol/L Final  . CO2 08/19/2014 16* 22 - 32 mmol/L Final  . Glucose, Bld 08/19/2014 93  65 - 99 mg/dL Final  . BUN 08/19/2014 47* 6 - 20 mg/dL Final  . Creatinine, Ser 08/19/2014 2.87* 0.44 - 1.00 mg/dL Final  . Calcium 08/19/2014 8.7* 8.9 - 10.3 mg/dL Final  . GFR calc non Af Amer 08/19/2014 14* >60 mL/min Final  . GFR calc Af Amer 08/19/2014 16* >60 mL/min Final   Comment: (NOTE) The eGFR has been calculated using the CKD EPI equation. This calculation has not been validated in all clinical situations. eGFR's persistently <60 mL/min signify possible Chronic Kidney Disease.   . Anion gap 08/19/2014 8  5 - 15 Final  . Glucose-Capillary 08/19/2014 89  65 - 99 mg/dL Final  . Sodium 08/20/2014 142  135 - 145 mmol/L Final  . Potassium 08/20/2014 4.0  3.5 - 5.1 mmol/L Final  . Chloride 08/20/2014 115* 101 - 111 mmol/L Final  . CO2 08/20/2014 17* 22 - 32 mmol/L Final  . Glucose, Bld 08/20/2014 89  65 - 99 mg/dL Final  . BUN 08/20/2014 36* 6 - 20 mg/dL Final  . Creatinine, Ser 08/20/2014 2.78* 0.44 - 1.00 mg/dL Final  . Calcium 08/20/2014 8.6* 8.9 - 10.3 mg/dL Final  . GFR calc non Af Amer 08/20/2014 14* >60 mL/min Final  . GFR calc Af Amer 08/20/2014 17* >60 mL/min Final   Comment: (NOTE) The eGFR has been calculated using the CKD EPI equation. This calculation has not been validated in all clinical situations. eGFR's persistently <60 mL/min signify possible Chronic Kidney Disease.   . Anion gap 08/20/2014 10  5 - 15 Final  . Glucose-Capillary 08/20/2014 86  65 - 99 mg/dL Final  . Comment 1 08/20/2014 Notify RN   Final  . Comment 2 08/20/2014 Document in Chart    Final     Assessment/Plan  1. Edema Improved DC sodium bicarbonate  2. Essential hypertension controlled  3. Chronic diastolic congestive heart failure Appears to be compensated  4. Dyspnea Improved  5. Anemia in chronic renal disease Most recent hemoglobin of 8.3 appears to be slightly improved over the last hemoglobin 7.7 g percent. We'll continue iron.  6. Right knee pain Continue to see orthopedist  7. Acute on chronic renal insufficiency Most recent BUN of 29 is substantially improved over BUN of 59 on 08/18/2014. Most recent creatinine of 2.8 has improved from creatinine on 08/17/2014 of 4.45.  8. Urinary incontinence Add oxybutynin 5 mg twice daily

## 2014-09-19 DIAGNOSIS — C50912 Malignant neoplasm of unspecified site of left female breast: Secondary | ICD-10-CM

## 2014-09-19 HISTORY — DX: Malignant neoplasm of unspecified site of left female breast: C50.912

## 2014-09-23 LAB — HM MAMMOGRAPHY

## 2014-09-28 ENCOUNTER — Encounter: Payer: Self-pay | Admitting: *Deleted

## 2014-10-06 ENCOUNTER — Other Ambulatory Visit: Payer: Self-pay | Admitting: Nurse Practitioner

## 2014-10-06 DIAGNOSIS — D509 Iron deficiency anemia, unspecified: Secondary | ICD-10-CM

## 2014-10-06 DIAGNOSIS — N289 Disorder of kidney and ureter, unspecified: Secondary | ICD-10-CM

## 2014-10-06 DIAGNOSIS — N189 Chronic kidney disease, unspecified: Secondary | ICD-10-CM

## 2014-10-06 LAB — HEPATIC FUNCTION PANEL
ALT: 5 U/L — AB (ref 7–35)
AST: 14 U/L (ref 13–35)
Alkaline Phosphatase: 47 U/L (ref 25–125)
Bilirubin, Total: 0.7 mg/dL

## 2014-10-06 LAB — CBC AND DIFFERENTIAL
HEMATOCRIT: 34 % — AB (ref 36–46)
Hemoglobin: 11.1 g/dL — AB (ref 12.0–16.0)
Platelets: 184 10*3/uL (ref 150–399)
WBC: 4.9 10*3/mL

## 2014-10-06 LAB — BASIC METABOLIC PANEL
BUN: 26 mg/dL — AB (ref 4–21)
CREATININE: 2.4 mg/dL — AB (ref 0.5–1.1)
GLUCOSE: 90 mg/dL
Potassium: 4 mmol/L (ref 3.4–5.3)
Sodium: 141 mmol/L (ref 137–147)

## 2014-10-13 ENCOUNTER — Other Ambulatory Visit: Payer: Self-pay | Admitting: Radiology

## 2014-10-17 ENCOUNTER — Encounter: Payer: Self-pay | Admitting: Internal Medicine

## 2014-10-17 ENCOUNTER — Telehealth: Payer: Self-pay | Admitting: *Deleted

## 2014-10-17 DIAGNOSIS — C50412 Malignant neoplasm of upper-outer quadrant of left female breast: Secondary | ICD-10-CM | POA: Insufficient documentation

## 2014-10-17 NOTE — Telephone Encounter (Signed)
Confirmed [facility] for 10/19/14 at 8am .  Instructions and contact information given.

## 2014-10-18 NOTE — Progress Notes (Signed)
[place]  [phone] [phone]  Clinic New Consult Note   Patient Care Team: Estill Dooms, MD as PCP - General (Internal Medicine) Juanita Craver, MD as Consulting Physician (Gastroenterology) Shon Hough, MD as Consulting Physician (Ophthalmology) Thornell Sartorius, MD as Consulting Physician (Otolaryngology) [place] Otho Darner, NP as Nurse Practitioner (Nurse Practitioner) Bo Merino, MD as Consulting Physician (Rheumatology) Suella Broad, MD as Consulting Physician (Physical Medicine and Rehabilitation) Netta Cedars, MD as Consulting Physician (Orthopedic Surgery) Fanny Skates, MD as Consulting Physician (General Surgery) Truitt Merle, MD as Consulting Physician (Hematology) Thea Silversmith, MD as Consulting Physician (Radiation Oncology) Rockwell Germany, RN as Registered Nurse Mauro Kaufmann, RN as Registered Nurse Sylvan Cheese, NP as Nurse Practitioner (Nurse Practitioner) 10/19/2014  CHIEF COMPLAINTS/PURPOSE OF CONSULTATION:  Newly diagnosed left breast DCIS  Oncology History   Breast cancer of upper-outer quadrant of left female breast   Staging form: Breast, AJCC 7th Edition     Clinical: Stage 0 (Tis (DCIS), N0, M0) - Unsigned       Breast cancer of upper-outer quadrant of left female breast   09/28/2014 Mammogram Mammogram showed a cluster of calcification in the left breast 12:00 position   10/13/2014 Initial Diagnosis Breast cancer of upper-outer quadrant of left female breast   10/13/2014 Initial Biopsy Left breast biopsy showed DCIS, high-grade, with calcification and necrosis.    HISTORY OF PRESENTING ILLNESS:  [name] 81 y.o. female with multiple medical comorbidities, as listed below, is here because of recently diagnosed left breast DCIS. She presents to our multidisciplinary breast clinic today with her daughter.  This was discovered by screening mammogram. She did not have  palpable breast mass, she denies any new symptoms lately. She had a stroke earlier this year, which resulted mild left-sided weakness. She is a resident in an assisted living, she is able to walk with a walker, and do some limited self care, but she is not active, spends most time on sitting during the day. Her daughter states she skips meals sometime, and does not go out often.   MEDICAL HISTORY:  Past Medical History  Diagnosis Date  . Senile osteoporosis   . Heart murmur   . Small bowel obstruction   . GERD (gastroesophageal reflux disease)   . Hypertension   . Glaucoma   . Arthritis     Osteoarthritis  . Personal history of fall   . Edema   . Hyperlipidemia   . Diverticulosis of colon (without mention of hemorrhage)   . Other malaise and fatigue   . Hypertonicity of bladder   . Osteoarthrosis, unspecified whether generalized or localized, unspecified site   . Lumbago   . Edema   . Pain in joint, pelvic region and thigh   . Pain in joint, shoulder region   . Flat feet   . Cerebral ischemia 12/19/2012    Microvascular   . Cerebral atrophy 12/19/2012  . Cerebral atherosclerosis 12/19/2012  . RLQ abdominal pain 01/17/2011  . Urine, incontinence, stress female 09/18/2014    SURGICAL HISTORY: Past Surgical History  Procedure Laterality Date  . Tonsillectomy    . Colon surgery  02/1996    Colectomy, partial for diverticular bleed 90%  . Joint replacement Left 12/2000    Left knee; Dr. Wynelle Link  . Breast lumpectomy Bilateral   . Laparoscopic lysis of adhesions  11/15/10    Exploratory   GYN HISTORY  Menarchal: 14 LMP: 52 Contraceptive: HRT:  no  G0P0:   SOCIAL HISTORY: Social History   Social History  . Marital Status: Widowed    Spouse Name: N/A  . Number of Children: N/A  . Years of Education: N/A   Occupational History  . Not on file.   Social History Main Topics  . Smoking status: Former Smoker -- 0.50 packs/day for 30 years    Types: Cigarettes    Quit  date: 12/16/1980  . Smokeless tobacco: Never Used  . Alcohol Use: 0.6 oz/week    1 Glasses of wine per week     Comment: occasional glass of wine  . Drug Use: No  . Sexual Activity: No   Other Topics Concern  . Not on file   Social History Narrative   Lives at [facility] 05/2011   Widowed 2003   Living Will   [place] with cane   Never smoked    Exercise none     FAMILY HISTORY: Family History  Problem Relation Age of Onset  . Cancer Father 65    colon  . Cancer Maternal Aunt     breast cancer   . Cancer Cousin     breast cancer     ALLERGIES:  is allergic to sulfa antibiotics.  MEDICATIONS:  Current Outpatient Prescriptions  Medication Sig Dispense Refill  . acetaminophen (TYLENOL) 325 MG tablet Take 2 tablets (650 mg total) by mouth every 6 (six) hours as needed for mild pain (or Fever >/= 101). 30 tablet 0  . atorvastatin (LIPITOR) 20 MG tablet Take one tablet by mouth once daily for cholesterol 30 tablet 3  . BETIMOL 0.5 % ophthalmic solution Place 1 drop into both eyes every morning.     . carvedilol (COREG) 3.125 MG tablet Take one tablet by mouth twice daily with a meal 60 tablet 5  . cloNIDine (CATAPRES) 0.1 MG tablet Take 0.1 mg by mouth. Take one tablet by mouth twice daily    . ferrous sulfate 325 (65 FE) MG tablet Take 325 mg by mouth daily with breakfast.    . furosemide (LASIX) 40 MG tablet Take one as needed for weight increase 2-3 lbs in 24 hrs.  Or 3-5 lbs in a week    . hydroxypropyl methylcellulose / hypromellose (ISOPTO TEARS / GONIOVISC) 2.5 % ophthalmic solution Place 2 drops into both eyes 2 (two) times daily.    Marland Kitchen latanoprost (XALATAN) 0.005 % ophthalmic solution Place 1 drop into both eyes at bedtime.     . mirtazapine (REMERON) 7.5 MG tablet TAKE 1 TABLET (7.5 MG TOTAL) BY MOUTH AT BEDTIME. NIGHTLY FOR APPETITE 30 tablet 3  . omeprazole (PRILOSEC) 20 MG capsule Take 20 mg by mouth daily.    Marland Kitchen oxybutynin (DITROPAN-XL) 5 MG 24 hr tablet  Take 5 mg by mouth at bedtime.    . pregabalin (LYRICA) 50 MG capsule Take 50 mg by mouth. Take one capsule daily    . vitamin B-12 (CYANOCOBALAMIN) 1000 MCG tablet Take 1,000 mcg by mouth 2 (two) times daily. Take one tablet by mouth twice daily on Mondays,Wednesdays and Fridays     No current facility-administered medications for this visit.    REVIEW OF SYSTEMS:   Constitutional: Denies fevers, chills or abnormal night sweats Eyes: Denies blurriness of vision, double vision or watery eyes Ears, nose, mouth, throat, and face: Denies mucositis or sore throat Respiratory: Denies cough, dyspnea or wheezes Cardiovascular: Denies palpitation, chest discomfort or lower extremity swelling Gastrointestinal:  Denies nausea, heartburn or  change in bowel habits Skin: Denies abnormal skin rashes Lymphatics: Denies new lymphadenopathy or easy bruising Neurological:Denies numbness, tingling or new weaknesses Behavioral/Psych: Mood is stable, no new changes  All other systems were reviewed with the patient and are negative.  PHYSICAL EXAMINATION: ECOG PERFORMANCE STATUS: 3 - Symptomatic, >50% confined to bed  Filed Vitals:   10/19/14 0907  BP: 198/80  Pulse: 73  Temp: 99 F (37.2 C)  Resp: 18   Filed Weights   10/19/14 0907  Weight: 137 lb 3.2 oz (62.234 kg)    GENERAL:alert, no distress and comfortable SKIN: skin color, texture, turgor are normal, no rashes or significant lesions EYES: normal, conjunctiva are pink and non-injected, sclera clear OROPHARYNX:no exudate, no erythema and lips, buccal mucosa, and tongue normal  NECK: supple, thyroid normal size, non-tender, without nodularity LYMPH:  no palpable lymphadenopathy in the cervical, axillary or inguinal LUNGS: clear to auscultation and percussion with normal breathing effort HEART: regular rate & rhythm and no murmurs and no lower extremity edema ABDOMEN:abdomen soft, non-tender and normal bowel sounds Musculoskeletal:no  cyanosis of digits and no clubbing  PSYCH: alert & oriented x 3 with fluent speech NEURO: no focal motor/sensory deficits Breasts: Breast inspection showed them to be symmetrical with no nipple discharge. Small ecchymosis at the biopsy site in left breast. Palpation of the breasts and axilla revealed no obvious mass that I could appreciate.   LABORATORY DATA:  I have reviewed the data as listed Lab Results  Component Value Date   WBC 5.2 10/19/2014   HGB 10.7* 10/19/2014   HCT 32.4* 10/19/2014   MCV 91.7 10/19/2014   PLT 195 10/19/2014    Recent Labs  08/16/14 1641  08/17/14 0410 08/18/14 0410 08/19/14 0350 08/20/14 0423  09/06/14 10/06/14 10/19/14 0824  NA 136  --  142 141 143 142  < > 143 141 144  K 5.9*  --  5.3* 4.2 4.2 4.0  < > 3.6 4.0 3.9  CL 112*  --  118* 118* 119* 115*  --   --   --   --   CO2 17*  --  17* 15* 16* 17*  --   --   --  23  GLUCOSE 103*  --  89 88 93 89  --   --   --  124  BUN 93*  --  86* 59* 47* 36*  < > 29* 26* 33.2*  CREATININE 5.15*  < > 4.45* 3.56* 2.87* 2.78*  < > 2.8* 2.4* 2.7*  CALCIUM 9.1  --  8.6* 8.6* 8.7* 8.6*  --   --   --  9.2  GFRNONAA 7*  < > 8* 11* 14* 14*  --   --   --   --   GFRAA 8*  < > 9* 12* 16* 17*  --   --   --   --   PROT 6.7  --  5.9*  --   --   --   --   --   --  6.3*  ALBUMIN 3.8  --  3.2*  --   --   --   --   --   --  3.5  AST 12*  --  11*  --   --   --   < > 16 14 15   ALT 8*  --  8*  --   --   --   < > 8 5* 8  ALKPHOS 41  --  34*  --   --   --   < >  41 47 48  BILITOT 0.6  --  0.5  --   --   --   --   --   --  0.72  < > = values in this interval not displayed.  PATHOLOGY REPORT: Diagnosis 10/13/2014 Breast, left, needle core biopsy, calcs - HIGH GRADE DUCTAL CARCINOMA IN SITU WITH CALCIFICATIONS AND NECROSIS. - SEE MICROSCOPIC DESCRIPTION. - FOCAL ATYPICAL LOBULAR HYPERPLASIA. Microscopic Comment Immunohistochemistry for basal cell markers show positivity with Calponin, p63, and smooth muscle myosin supporting the  diagnosis of high grade ductal carcinoma in situ. Estrogen and progesterone receptors will be performed.  Results: IMMUNOHISTOCHEMICAL AND MORPHOMETRIC ANALYSIS PERFORMED MANUALLY Estrogen Receptor: 0%, NEGATIVE Progesterone Receptor: 0%, NEGATIVE   RADIOGRAPHIC STUDIES: I have personally reviewed the radiological images as listed and agreed with the findings in the report.  Mammogram 09/28/2014 There is a new cluster of pleomorphic calcifications in the left breast at 12:00 middle depth.  ASSESSMENT & PLAN: 81 year old Caucasian female, postmenopausal, with multiple comorbidities, including a stroke in January 2016, a resident at assisted living, presents with screening detected left breast DCIS.  1. Left breast DCIS, high-grade, ER negative/PR negative -I discussed her scan and breast biopsy results. -The patient had early stage disease. She is considered cured of disease after completing surgical resection.  -She was seen by Dr. Dalbert Batman today, lumpectomy without sentinel lymph nodes biopsy was discussed with patient and her daughter. Given her advanced age and medical comorbidities, she does have some risk with anesthesia. Patient and her daughter are in favor of having surgery. Any form of adjuvant treatment is for prevention of disease recurrence.  Given her negative ER and PR status of her tumor, I do not recommend any antiestrogen therapy.  If she has wide negative surgical margin, she may not benefit much from adjuvant radiation and also, given her advanced age. She was seen by Dr. Pablo Ledger   Plan -She will likely proceed with breast lumpectomy -I'll review her surgical pathology, if it remains the same as biopsy, I do not need to see her back. She will follow-up with her primary care physician, and continue annual screening mammogram.  All questions were answered. The patient knows to call the clinic with any problems, questions or concerns. I spent 40 minutes counseling the  patient face to face. The total time spent in the appointment was 55 minutes and more than 50% was on counseling.     Truitt Merle, MD 10/19/2014 11:16 PM

## 2014-10-19 ENCOUNTER — Ambulatory Visit (HOSPITAL_BASED_OUTPATIENT_CLINIC_OR_DEPARTMENT_OTHER): Payer: Medicare Other | Admitting: Hematology

## 2014-10-19 ENCOUNTER — Encounter: Payer: Self-pay | Admitting: Skilled Nursing Facility1

## 2014-10-19 ENCOUNTER — Ambulatory Visit: Payer: Private Health Insurance - Indemnity | Admitting: Physical Therapy

## 2014-10-19 ENCOUNTER — Encounter: Payer: Self-pay | Admitting: Nurse Practitioner

## 2014-10-19 ENCOUNTER — Ambulatory Visit: Payer: Medicare Other

## 2014-10-19 ENCOUNTER — Other Ambulatory Visit: Payer: Self-pay | Admitting: General Surgery

## 2014-10-19 ENCOUNTER — Encounter: Payer: Self-pay | Admitting: Hematology

## 2014-10-19 ENCOUNTER — Other Ambulatory Visit (HOSPITAL_BASED_OUTPATIENT_CLINIC_OR_DEPARTMENT_OTHER): Payer: Medicare Other

## 2014-10-19 ENCOUNTER — Ambulatory Visit
Admission: RE | Admit: 2014-10-19 | Discharge: 2014-10-19 | Disposition: A | Discharge status: Home / Self Care | Payer: Private Health Insurance - Indemnity | Source: Ambulatory Visit | Attending: Radiation Oncology | Admitting: Radiation Oncology

## 2014-10-19 VITALS — BP 198/80 | HR 73 | Temp 99.0°F | Resp 18 | Ht 66.0 in | Wt 137.2 lb

## 2014-10-19 DIAGNOSIS — D0512 Intraductal carcinoma in situ of left breast: Secondary | ICD-10-CM

## 2014-10-19 DIAGNOSIS — C50912 Malignant neoplasm of unspecified site of left female breast: Secondary | ICD-10-CM

## 2014-10-19 DIAGNOSIS — C50412 Malignant neoplasm of upper-outer quadrant of left female breast: Secondary | ICD-10-CM

## 2014-10-19 DIAGNOSIS — Z171 Estrogen receptor negative status [ER-]: Secondary | ICD-10-CM

## 2014-10-19 LAB — CBC WITH DIFFERENTIAL/PLATELET
BASO%: 0.1 % (ref 0.0–2.0)
Basophils Absolute: 0 10*3/uL (ref 0.0–0.1)
EOS%: 1.9 % (ref 0.0–7.0)
Eosinophils Absolute: 0.1 10*3/uL (ref 0.0–0.5)
HCT: 32.4 % — ABNORMAL LOW (ref 34.8–46.6)
HGB: 10.7 g/dL — ABNORMAL LOW (ref 11.6–15.9)
LYMPH%: 22.1 % (ref 14.0–49.7)
MCH: 30.2 pg (ref 25.1–34.0)
MCHC: 32.9 g/dL (ref 31.5–36.0)
MCV: 91.7 fL (ref 79.5–101.0)
MONO#: 0.6 10*3/uL (ref 0.1–0.9)
MONO%: 12.2 % (ref 0.0–14.0)
NEUT%: 63.7 % (ref 38.4–76.8)
NEUTROS ABS: 3.3 10*3/uL (ref 1.5–6.5)
PLATELETS: 195 10*3/uL (ref 145–400)
RBC: 3.53 10*6/uL — AB (ref 3.70–5.45)
RDW: 14.7 % — ABNORMAL HIGH (ref 11.2–14.5)
WBC: 5.2 10*3/uL (ref 3.9–10.3)
lymph#: 1.2 10*3/uL (ref 0.9–3.3)

## 2014-10-19 LAB — COMPREHENSIVE METABOLIC PANEL (CC13)
ALT: 8 U/L (ref 0–55)
ANION GAP: 10 meq/L (ref 3–11)
AST: 15 U/L (ref 5–34)
Albumin: 3.5 g/dL (ref 3.5–5.0)
Alkaline Phosphatase: 48 U/L (ref 40–150)
BUN: 33.2 mg/dL — ABNORMAL HIGH (ref 7.0–26.0)
CHLORIDE: 111 meq/L — AB (ref 98–109)
CO2: 23 meq/L (ref 22–29)
CREATININE: 2.7 mg/dL — AB (ref 0.6–1.1)
Calcium: 9.2 mg/dL (ref 8.4–10.4)
EGFR: 15 mL/min/{1.73_m2} — AB (ref >90)
GLUCOSE: 124 mg/dL (ref 70–140)
Potassium: 3.9 mEq/L (ref 3.5–5.1)
SODIUM: 144 meq/L (ref 136–145)
Total Bilirubin: 0.72 mg/dL (ref 0.20–1.20)
Total Protein: 6.3 g/dL — ABNORMAL LOW (ref 6.4–8.3)

## 2014-10-19 NOTE — Progress Notes (Signed)
Checked in new pt with no financial concerns.  Pt has 2 insurances so financial assistance may not be needed.

## 2014-10-19 NOTE — Progress Notes (Signed)
Subjective:     Patient ID: [name], female   DOB: [birth year], 81 y.o.   MRN: [id number]  HPI   Review of Systems     Objective:   Physical Exam For the patient to understand and be given the tools to implement a healthy plant based diet during their cancer diagnosis.     Assessment:     Patient was seen today and found to be in good spirits and accompanied by her daughter. Pt lives in [place] in skilled nursing and did not have her hearing aid in. Pts ht 5'6'', 137 pounds, and BMI 22.2. Labs: chloride 111, BUN 33.2, creatinine 2.7, total protein 6.3, GFR 15, HGB 10.7, HCT 32.4. Pt does have kidney disease.     Plan:     As a part of the continuum of care the cancer dietitian's contact information was given to the patient in the event they would like to have a follow up appointment. A folder of evidence based information with a focus on a plant based diet and general nutrition during cancer was given to the patient.  Dietitian educated the pts daughter on the importance of ensuring her mother eats throughout the day.

## 2014-10-19 NOTE — Progress Notes (Signed)
[name] is a very pleasant 81 y.o. female from [place], [place] with newly diagnosed grade 3 ductal carcinoma in situ of the left breast.  Biopsy results revealed the tumor's prognostic profile is ER negative and PR negative.  She presents today with her daughter to the [facility]) for treatment consideration and recommendations from the breast surgeon, radiation oncologist, and medical oncologist.     I briefly met with [name] and her daughter during her [facility] visit today. We discussed the purpose of the Survivorship Clinic, which will include monitoring for recurrence, coordinating completion of age and gender-appropriate cancer screenings, promotion of overall wellness, as well as managing potential late/long-term side effects of anti-cancer treatments.    The treatment plan for [name] will likely include surgery.  As of today, the intent of treatment for [name] is cure, therefore she will be eligible for the Survivorship Clinic upon her completion of treatment.  Her survivorship care plan (SCP) document will be drafted and updated throughout the course of her treatment trajectory. She will receive the SCP either in an office visit with myself in the [facility] or mailed to her once she has completed treatment.   [name] was encouraged to ask questions and all questions were answered to her satisfaction.  She was given my business card and encouraged to contact me with any concerns regarding survivorship.  I look forward to participating in her care.   Kenn File, [place] [phone]

## 2014-10-19 NOTE — Progress Notes (Signed)
  Radiation Oncology         [phone]) [phone] ________________________________  Initial Outpatient Consultation - Date: 10/19/2014   Name: [name] MRN: [id number]   DOB: 16-Mar-1925  REFERRING PHYSICIAN: Fanny Skates, MD  DIAGNOSIS AND STAGE: Breast cancer of upper-outer quadrant of left female breast   Staging form: Breast, AJCC 7th Edition     Clinical: Stage 0 (Tis (DCIS), N0, M0) - Unsigned   HISTORY OF PRESENT ILLNESS:[name] is a 81 y.o. female presenting to clinic in regards to her cancer of the left female breast. She had a biopsy which showed high grade DCIS. Her receptors are pending. She is accompanied by her daughter. She lives in assisted living. She has some dementia.  She is interested in lumpectomy. She walks with a walker.   PREVIOUS RADIATION THERAPY: No  Past medical, social and family history were reviewed in the electronic chart. Review of symptoms was reviewed in the electronic chart. Medications were reviewed in the electronic chart.   OBGYN HISTORY: The patient presented with her first menstrual period at the age of 81 years old. She currently does not experience menstrual period's and experienced her last period at 81 years old. The patient has never used hormone replacement. She has one adopted daughter and carried no children to full term or completed a live birth. She is not currently trying to become pregnant, or use birth control pills or hormone shots for contraception. She denies ever having had a colonoscopy or bone density.  PHYSICAL EXAM:  Vitals 10/19/14    BP 198/80 mmHg    Pulse Rate 73    Resp 18    Temp 99 F (37.2 C)    Temp Source Oral    SpO2 98 %    Weight 137 lb 3.2 oz (62.234 kg)    Height 5\' 6"  (1.676 m)          IMPRESSION: [name] is an 81 year old female presenting to clinic in regards to her Stage 0 DCIS of the left female breast. Radiation therapy is not recommended at this time.  PLAN: Due to her age and this  small area of DCIS, I think she would be fine to proceed with surgery alone. As long as her margins are adequate, she can consider anti-estrogen therapy as well. I am doubtful that radiation will provide any benefit for her. She understands she will need annual mammograms for monitoring.  I spent 30  face to face with the patient and more than 50% of that time was spent in counseling and/or coordination of care.    This document serves as a record of services personally performed by Thea Silversmith , MD. It was created on her behalf by Lenn Cal, a trained medical scribe. The creation of this record is based on the scribe's personal observations and the provider's statements to them. This document has been checked and approved by the attending provider.  ________________________________  Thea Silversmith, MD

## 2014-10-26 ENCOUNTER — Telehealth: Payer: Self-pay | Admitting: *Deleted

## 2014-10-26 NOTE — Telephone Encounter (Signed)
Spoke with patient from [facility] 10/19/14.  She is waiting on cardiac clearance from her MD.  She is doing well.  Encouraged her to call with any needs or concerns.

## 2014-11-03 ENCOUNTER — Encounter: Payer: Self-pay | Admitting: Internal Medicine

## 2014-11-03 ENCOUNTER — Non-Acute Institutional Stay: Payer: Medicare Other | Admitting: Internal Medicine

## 2014-11-03 VITALS — BP 206/94 | HR 76 | Temp 98.1°F | Wt 137.0 lb

## 2014-11-03 DIAGNOSIS — N183 Chronic kidney disease, stage 3 unspecified: Secondary | ICD-10-CM | POA: Insufficient documentation

## 2014-11-03 DIAGNOSIS — R609 Edema, unspecified: Secondary | ICD-10-CM | POA: Diagnosis not present

## 2014-11-03 DIAGNOSIS — C50412 Malignant neoplasm of upper-outer quadrant of left female breast: Secondary | ICD-10-CM | POA: Diagnosis not present

## 2014-11-03 DIAGNOSIS — I10 Essential (primary) hypertension: Secondary | ICD-10-CM

## 2014-11-03 DIAGNOSIS — N393 Stress incontinence (female) (male): Secondary | ICD-10-CM | POA: Diagnosis not present

## 2014-11-03 DIAGNOSIS — I5032 Chronic diastolic (congestive) heart failure: Secondary | ICD-10-CM | POA: Diagnosis not present

## 2014-11-03 HISTORY — DX: Chronic kidney disease, stage 3 unspecified: N18.30

## 2014-11-03 MED ORDER — LOSARTAN POTASSIUM 50 MG PO TABS: ORAL_TABLET | ORAL | Status: DC

## 2014-11-03 NOTE — Progress Notes (Signed)
Patient ID: [name], female   DOB: Oct 01, 1925, 81 y.o.   MRN: [id number]    [facility] Room Number: 993  Place of Service: Clinic (12)     Allergies  Allergen Reactions  . Sulfa Antibiotics Itching    Chief Complaint  Patient presents with  . Medical Management of Chronic Issues    edema, CHF, anemia.  . Breast Mass    to have left breast surgery    HPI:  This last seen 09/08/2014, she had abnormal mammogram and now has seen Dr. Fanny Skates or a lumpectomy of the left breast. Surgery date has not been set yet.  Patient was scheduled to be here today for follow-up of her edema. It remains about the same. She has hard compression stockings, but she does not wear them every single day.  She denies shortness of breath or other symptoms that would not expect of her congestive heart failure was bad.  Blood pressure remains under poor control with multiple episodes of elevated blood pressure. Typically this is just the systolic side. Patient previously was on lisinopril, but with evidence for CKD, it was discontinued a couple of months ago. We have not reestablish control with clonidine plus Coreg.  Her anemia has improved with hemoglobin rising from 8.3 up to 10.7.  CKD is about the same with current BUN at 33 and creatinine 2.7.  Incontinence was improved by the addition of oxybutynin. She has experienced from mouth and is willing to continue with the OxyContin and because of the improvement of her incontinence.  Her right knee was evaluated by orthopedist, Dr. Veverly Fells. Injected a steroid in the right knee about 8 days ago and this improved her knee.  Medications: Patient's Medications  New Prescriptions   No medications on file  Previous Medications   ACETAMINOPHEN (TYLENOL) 325 MG TABLET    Take 2 tablets (650 mg total) by mouth every 6 (six) hours as needed for mild pain (or Fever >/= 101).   ALUM & MAG HYDROXIDE-SIMETH  (MAALOX/MYLANTA) 716-967-89 MG/5ML SUSPENSION    Take by mouth. 75ml every 4 hours as needed for indigestion   ATORVASTATIN (LIPITOR) 20 MG TABLET    Take one tablet by mouth once daily for cholesterol   BETIMOL 0.5 % OPHTHALMIC SOLUTION    Place 1 drop into both eyes every morning.    CARVEDILOL (COREG) 3.125 MG TABLET    Take one tablet by mouth twice daily with a meal   CLONIDINE (CATAPRES) 0.1 MG TABLET    Take 0.1 mg by mouth. Take one tablet by mouth twice daily   FERROUS SULFATE 325 (65 FE) MG TABLET    Take 325 mg by mouth daily with breakfast.   FUROSEMIDE (LASIX) 40 MG TABLET    Take one as needed for weight increase 2-3 lbs in 24 hrs.  Or 3-5 lbs in a week   HYDROXYPROPYL METHYLCELLULOSE / HYPROMELLOSE (ISOPTO TEARS / GONIOVISC) 2.5 % OPHTHALMIC SOLUTION    Place 2 drops into both eyes 2 (two) times daily.   LATANOPROST (XALATAN) 0.005 % OPHTHALMIC SOLUTION    Place 1 drop into both eyes at bedtime.    MIRTAZAPINE (REMERON) 7.5 MG TABLET    TAKE 1 TABLET (7.5 MG TOTAL) BY MOUTH AT BEDTIME. NIGHTLY FOR APPETITE   OMEPRAZOLE (PRILOSEC) 20 MG CAPSULE    Take 20 mg by mouth daily.   OXYBUTYNIN (DITROPAN-XL) 5 MG 24 HR TABLET    Take 5 mg  by mouth at bedtime.   PREGABALIN (LYRICA) 50 MG CAPSULE    Take 50 mg by mouth. Take one capsule daily   VITAMIN B-12 (CYANOCOBALAMIN) 1000 MCG TABLET    Take 1,000 mcg by mouth 2 (two) times daily. Take one tablet by mouth twice daily on Mondays,Wednesdays and Fridays  Modified Medications   No medications on file  Discontinued Medications   No medications on file     Review of Systems  Constitutional:       Complains of generalized weakness  HENT: Positive for hearing loss. Negative for ear pain.   Eyes: Negative.   Respiratory: Negative.   Cardiovascular: Positive for leg swelling (ankles). Negative for chest pain and palpitations.       Increased edema R+L ankles  Gastrointestinal:       Previous complaints of chronic mild right lower  quadrant abdominal pain. History of diverticulosis. Now doing better. She denies any abdominal pain.  Endocrine: Negative.   Genitourinary: Negative.   Musculoskeletal: Positive for back pain and arthralgias (right knee. Left shoulder.). Negative for myalgias, neck pain and neck stiffness.       Flat feet with dropped arches. Pin in the right medial malleolar bones. Chronic back pain. Chronic right shoulder pain. Right knee pain. Uses 4 wheel walker.  Skin: Negative.   Neurological: Positive for weakness (Generalized).       Complaints of confusion earlier today  Hematological:       History of anemia.  Psychiatric/Behavioral: The patient is nervous/anxious.     Filed Vitals:   11/03/14 1348  BP: 206/94  Pulse: 76  Temp: 98.1 F (36.7 C)  TempSrc: Oral  Weight: 137 lb (62.143 kg)  SpO2: 99%   Body mass index is 22.12 kg/(m^2).  Physical Exam  Constitutional: She is oriented to person, place, and time. She appears well-developed and well-nourished. No distress.  HENT:  Head: Normocephalic and atraumatic.  Right Ear: External ear normal.  Left Ear: External ear normal.  Nose: Nose normal.  Mouth/Throat: Oropharynx is clear and moist. No oropharyngeal exudate.  Eyes: Conjunctivae and EOM are normal. Pupils are equal, round, and reactive to light. Right eye exhibits no discharge. Left eye exhibits no discharge. No scleral icterus.  Neck: Normal range of motion. Neck supple. No JVD present. No thyromegaly present.  Cardiovascular: Normal rate and regular rhythm.   Murmur heard.  Systolic murmur is present with a grade of 2/6  Pulmonary/Chest: Effort normal. No respiratory distress. She has no wheezes. She has rales (sparse). She exhibits no tenderness.  Abdominal: Soft. Bowel sounds are normal. She exhibits no distension. There is no tenderness. There is no rebound.  Musculoskeletal: Normal range of motion. She exhibits edema (1+  bipedal). She exhibits no tenderness.  R+L  ankle edema.  Right knee deformity with osteoarthritic changes.  Lymphadenopathy:    She has no cervical adenopathy.  Neurological: She is alert and oriented to person, place, and time. She has normal reflexes. No cranial nerve deficit. She exhibits normal muscle tone. Coordination normal.  04/21/14 MMSE 28/30. Failed clock drawing. Mild stutter.  Skin: Skin is warm and dry. No rash noted. She is not diaphoretic. No erythema.  Psychiatric: She has a normal mood and affect. Her behavior is normal. Judgment and thought content normal.     Labs reviewed: Lab Summary Latest Ref Rng 10/19/2014 10/06/2014 09/06/2014  Hemoglobin 11.6 - 15.9 g/dL 10.7(L) 11.1(A) 8.3(A)  Hematocrit 34.8 - 46.6 % 32.4(L) 34(A) 26(A)  White count  3.9 - 10.3 10e3/uL 5.2 4.9 5.3  Platelet count 145 - 400 10e3/uL 195 184 195  Sodium 136 - 145 mEq/L 144 141 143  Potassium 3.5 - 5.1 mEq/L 3.9 4.0 3.6  Calcium 8.4 - 10.4 mg/dL 9.2 (None) (None)  Phosphorus - (None) (None) (None)  Creatinine 0.6 - 1.1 mg/dL 2.7(H) 2.4(A) 2.8(A)  AST 5 - 34 U/L $Remo'15 14 16  'jgOws$ Alk Phos 40 - 150 U/L 48 47 41  Bilirubin 0.20 - 1.20 mg/dL 0.72 (None) (None)  Glucose 70 - 140 mg/dl 124 90 (None)  Cholesterol - (None) (None) (None)  HDL cholesterol - (None) (None) (None)  Triglycerides - (None) (None) (None)  LDL Direct - (None) (None) (None)  LDL Calc - (None) (None) (None)  Total protein 6.4 - 8.3 g/dL 6.3(L) (None) (None)  Albumin 3.5 - 5.0 g/dL 3.5 (None) (None)   Lab Results  Component Value Date   TSH 2.227 08/16/2014   Lab Results  Component Value Date   BUN 33.2* 10/19/2014   Lab Results  Component Value Date   HGBA1C 6.1* 08/16/2014       Assessment/Plan  1. Breast cancer of upper-outer quadrant of left female breast Continue with scheduled appointments for surgery when advised by Dr. Dalbert Batman.  2. Edema Stable. Discontinued Lyrica. I think she can do without this drug and it may be contributing to the edema.  3.  Chronic diastolic congestive heart failure Compensated  4. Essential hypertension Add - losartan (COZAAR) 50 MG tablet; One daily to control blood pressure  Dispense: 30 tablet; Refill: 5  5. CKD (chronic kidney disease) stage 3, GFR 30-59 ml/min Follow-up lab  6. Urine, incontinence, stress female Improved on oxybutynin

## 2014-11-03 NOTE — Patient Instructions (Signed)
Wear compression stockings daily

## 2014-11-04 ENCOUNTER — Encounter (HOSPITAL_COMMUNITY): Payer: Self-pay

## 2014-11-04 ENCOUNTER — Inpatient Hospital Stay (HOSPITAL_COMMUNITY)
Admission: EM | Admit: 2014-11-04 | Discharge: 2014-11-10 | DRG: 280 | Disposition: A | Discharge status: Skilled Nursing Facility | Payer: Medicare Other | Attending: Cardiovascular Disease | Admitting: Cardiovascular Disease

## 2014-11-04 ENCOUNTER — Emergency Department (HOSPITAL_COMMUNITY): Payer: Medicare Other

## 2014-11-04 DIAGNOSIS — Z803 Family history of malignant neoplasm of breast: Secondary | ICD-10-CM | POA: Diagnosis not present

## 2014-11-04 DIAGNOSIS — Z01811 Encounter for preprocedural respiratory examination: Secondary | ICD-10-CM

## 2014-11-04 DIAGNOSIS — E785 Hyperlipidemia, unspecified: Secondary | ICD-10-CM | POA: Diagnosis present

## 2014-11-04 DIAGNOSIS — I129 Hypertensive chronic kidney disease with stage 1 through stage 4 chronic kidney disease, or unspecified chronic kidney disease: Secondary | ICD-10-CM | POA: Diagnosis present

## 2014-11-04 DIAGNOSIS — G3184 Mild cognitive impairment, so stated: Secondary | ICD-10-CM | POA: Diagnosis present

## 2014-11-04 DIAGNOSIS — N183 Chronic kidney disease, stage 3 (moderate): Secondary | ICD-10-CM | POA: Diagnosis present

## 2014-11-04 DIAGNOSIS — I272 Other secondary pulmonary hypertension: Secondary | ICD-10-CM | POA: Diagnosis present

## 2014-11-04 DIAGNOSIS — K219 Gastro-esophageal reflux disease without esophagitis: Secondary | ICD-10-CM | POA: Diagnosis present

## 2014-11-04 DIAGNOSIS — E872 Acidosis: Secondary | ICD-10-CM | POA: Diagnosis present

## 2014-11-04 DIAGNOSIS — M810 Age-related osteoporosis without current pathological fracture: Secondary | ICD-10-CM | POA: Diagnosis present

## 2014-11-04 DIAGNOSIS — I071 Rheumatic tricuspid insufficiency: Secondary | ICD-10-CM | POA: Diagnosis present

## 2014-11-04 DIAGNOSIS — I2510 Atherosclerotic heart disease of native coronary artery without angina pectoris: Secondary | ICD-10-CM | POA: Diagnosis present

## 2014-11-04 DIAGNOSIS — Z79899 Other long term (current) drug therapy: Secondary | ICD-10-CM | POA: Diagnosis not present

## 2014-11-04 DIAGNOSIS — Z96652 Presence of left artificial knee joint: Secondary | ICD-10-CM | POA: Diagnosis present

## 2014-11-04 DIAGNOSIS — I255 Ischemic cardiomyopathy: Secondary | ICD-10-CM

## 2014-11-04 DIAGNOSIS — A419 Sepsis, unspecified organism: Secondary | ICD-10-CM | POA: Diagnosis present

## 2014-11-04 DIAGNOSIS — R7989 Other specified abnormal findings of blood chemistry: Secondary | ICD-10-CM | POA: Diagnosis not present

## 2014-11-04 DIAGNOSIS — H409 Unspecified glaucoma: Secondary | ICD-10-CM | POA: Diagnosis present

## 2014-11-04 DIAGNOSIS — D500 Iron deficiency anemia secondary to blood loss (chronic): Secondary | ICD-10-CM | POA: Diagnosis present

## 2014-11-04 DIAGNOSIS — I350 Nonrheumatic aortic (valve) stenosis: Secondary | ICD-10-CM | POA: Diagnosis present

## 2014-11-04 DIAGNOSIS — C50912 Malignant neoplasm of unspecified site of left female breast: Secondary | ICD-10-CM | POA: Diagnosis present

## 2014-11-04 DIAGNOSIS — R0600 Dyspnea, unspecified: Secondary | ICD-10-CM | POA: Diagnosis present

## 2014-11-04 DIAGNOSIS — I340 Nonrheumatic mitral (valve) insufficiency: Secondary | ICD-10-CM | POA: Diagnosis present

## 2014-11-04 DIAGNOSIS — M1990 Unspecified osteoarthritis, unspecified site: Secondary | ICD-10-CM | POA: Diagnosis present

## 2014-11-04 DIAGNOSIS — I509 Heart failure, unspecified: Secondary | ICD-10-CM

## 2014-11-04 DIAGNOSIS — E876 Hypokalemia: Secondary | ICD-10-CM | POA: Diagnosis not present

## 2014-11-04 DIAGNOSIS — Z87891 Personal history of nicotine dependence: Secondary | ICD-10-CM

## 2014-11-04 DIAGNOSIS — R778 Other specified abnormalities of plasma proteins: Secondary | ICD-10-CM

## 2014-11-04 DIAGNOSIS — I10 Essential (primary) hypertension: Secondary | ICD-10-CM | POA: Diagnosis not present

## 2014-11-04 DIAGNOSIS — N390 Urinary tract infection, site not specified: Secondary | ICD-10-CM | POA: Diagnosis present

## 2014-11-04 DIAGNOSIS — J810 Acute pulmonary edema: Secondary | ICD-10-CM

## 2014-11-04 DIAGNOSIS — I2109 ST elevation (STEMI) myocardial infarction involving other coronary artery of anterior wall: Secondary | ICD-10-CM | POA: Diagnosis present

## 2014-11-04 DIAGNOSIS — R739 Hyperglycemia, unspecified: Secondary | ICD-10-CM | POA: Diagnosis present

## 2014-11-04 DIAGNOSIS — N179 Acute kidney failure, unspecified: Secondary | ICD-10-CM | POA: Diagnosis present

## 2014-11-04 DIAGNOSIS — K5730 Diverticulosis of large intestine without perforation or abscess without bleeding: Secondary | ICD-10-CM | POA: Diagnosis present

## 2014-11-04 DIAGNOSIS — R0902 Hypoxemia: Secondary | ICD-10-CM | POA: Diagnosis present

## 2014-11-04 DIAGNOSIS — I5021 Acute systolic (congestive) heart failure: Secondary | ICD-10-CM | POA: Diagnosis present

## 2014-11-04 DIAGNOSIS — I214 Non-ST elevation (NSTEMI) myocardial infarction: Secondary | ICD-10-CM | POA: Diagnosis not present

## 2014-11-04 DIAGNOSIS — I213 ST elevation (STEMI) myocardial infarction of unspecified site: Secondary | ICD-10-CM

## 2014-11-04 DIAGNOSIS — N318 Other neuromuscular dysfunction of bladder: Secondary | ICD-10-CM | POA: Diagnosis present

## 2014-11-04 DIAGNOSIS — Z882 Allergy status to sulfonamides status: Secondary | ICD-10-CM | POA: Diagnosis not present

## 2014-11-04 DIAGNOSIS — R2681 Unsteadiness on feet: Secondary | ICD-10-CM

## 2014-11-04 HISTORY — DX: ST elevation (STEMI) myocardial infarction of unspecified site: I21.3

## 2014-11-04 HISTORY — DX: Unsteadiness on feet: R26.81

## 2014-11-04 HISTORY — DX: Ischemic cardiomyopathy: I25.5

## 2014-11-04 LAB — URINALYSIS, ROUTINE W REFLEX MICROSCOPIC
BILIRUBIN URINE: NEGATIVE
Glucose, UA: NEGATIVE mg/dL
KETONES UR: NEGATIVE mg/dL
Leukocytes, UA: NEGATIVE
NITRITE: NEGATIVE
PROTEIN: 100 mg/dL — AB
Specific Gravity, Urine: 1.011 (ref 1.005–1.030)
UROBILINOGEN UA: 0.2 mg/dL (ref 0.0–1.0)
pH: 6 (ref 5.0–8.0)

## 2014-11-04 LAB — I-STAT CG4 LACTIC ACID, ED
Lactic Acid, Venous: 1.92 mmol/L (ref 0.5–2.0)
Lactic Acid, Venous: 1.08 mmol/L (ref 0.5–2.0)

## 2014-11-04 LAB — COMPREHENSIVE METABOLIC PANEL
ALK PHOS: 55 U/L (ref 38–126)
ALT: 13 U/L — ABNORMAL LOW (ref 14–54)
ANION GAP: 11 (ref 5–15)
AST: 21 U/L (ref 15–41)
Albumin: 4.4 g/dL (ref 3.5–5.0)
BILIRUBIN TOTAL: 0.7 mg/dL (ref 0.3–1.2)
BUN: 37 mg/dL — ABNORMAL HIGH (ref 6–20)
CALCIUM: 8.9 mg/dL (ref 8.9–10.3)
CO2: 20 mmol/L — ABNORMAL LOW (ref 22–32)
Chloride: 109 mmol/L (ref 101–111)
Creatinine, Ser: 2.3 mg/dL — ABNORMAL HIGH (ref 0.44–1.00)
GFR calc Af Amer: 21 mL/min — ABNORMAL LOW (ref >60)
GFR, EST NON AFRICAN AMERICAN: 18 mL/min — AB (ref >60)
GLUCOSE: 145 mg/dL — AB (ref 65–99)
POTASSIUM: 4 mmol/L (ref 3.5–5.1)
Sodium: 140 mmol/L (ref 135–145)
TOTAL PROTEIN: 7.5 g/dL (ref 6.5–8.1)

## 2014-11-04 LAB — BLOOD GAS, ARTERIAL
Acid-base deficit: 8.4 mmol/L — ABNORMAL HIGH (ref 0.0–2.0)
BICARBONATE: 16.4 meq/L — AB (ref 20.0–24.0)
FIO2: 1
O2 SAT: 99 %
PCO2 ART: 38 mmHg (ref 35.0–45.0)
PH ART: 7.276 — AB (ref 7.350–7.450)
PO2 ART: 211 mmHg — AB (ref 80.0–100.0)
Patient temperature: 104
TCO2: 15.2 mmol/L (ref 0–100)

## 2014-11-04 LAB — CBC WITH DIFFERENTIAL/PLATELET
BASOS ABS: 0 10*3/uL (ref 0.0–0.1)
Basophils Relative: 0 %
EOS ABS: 0.2 10*3/uL (ref 0.0–0.7)
Eosinophils Relative: 1 %
HEMATOCRIT: 37.9 % (ref 36.0–46.0)
Hemoglobin: 12.3 g/dL (ref 12.0–15.0)
LYMPHS ABS: 1.6 10*3/uL (ref 0.7–4.0)
LYMPHS PCT: 8 %
MCH: 30.3 pg (ref 26.0–34.0)
MCHC: 32.5 g/dL (ref 30.0–36.0)
MCV: 93.3 fL (ref 78.0–100.0)
MONOS PCT: 4 %
Monocytes Absolute: 0.8 10*3/uL (ref 0.1–1.0)
NEUTROS ABS: 16.8 10*3/uL — AB (ref 1.7–7.7)
Neutrophils Relative %: 87 %
Platelets: 216 10*3/uL (ref 150–400)
RBC: 4.06 MIL/uL (ref 3.87–5.11)
RDW: 14.6 % (ref 11.5–15.5)
WBC: 19.4 10*3/uL — AB (ref 4.0–10.5)

## 2014-11-04 LAB — BRAIN NATRIURETIC PEPTIDE: B NATRIURETIC PEPTIDE 5: 618.3 pg/mL — AB (ref 0.0–100.0)

## 2014-11-04 LAB — TROPONIN I: TROPONIN I: 0.19 ng/mL — AB (ref <0.031)

## 2014-11-04 LAB — URINE MICROSCOPIC-ADD ON

## 2014-11-04 MED ORDER — LEVALBUTEROL HCL 1.25 MG/0.5ML IN NEBU
1.2500 mg | INHALATION_SOLUTION | Freq: Once | RESPIRATORY_TRACT | Status: AC
  Administered 2014-11-04: 1.25 mg via RESPIRATORY_TRACT
  Filled 2014-11-04: qty 0.5

## 2014-11-04 MED ORDER — SODIUM CHLORIDE 0.9 % IV BOLUS (SEPSIS)
1000.0000 mL | INTRAVENOUS | Status: AC
  Administered 2014-11-04 (×2): 1000 mL via INTRAVENOUS

## 2014-11-04 MED ORDER — CHLORHEXIDINE GLUCONATE 4 % EX LIQD: 1.0000 "application " | Freq: Once | CUTANEOUS | Status: DC

## 2014-11-04 MED ORDER — TIMOLOL MALEATE 0.5 % OP SOLN
1.0000 [drp] | Freq: Every day | OPHTHALMIC | Status: DC
  Administered 2014-11-05 – 2014-11-10 (×5): 1 [drp] via OPHTHALMIC
  Filled 2014-11-04 (×3): qty 5

## 2014-11-04 MED ORDER — ALUM & MAG HYDROXIDE-SIMETH 200-200-20 MG/5ML PO SUSP: 15.0000 mL | Freq: Four times a day (QID) | ORAL | Status: DC | PRN

## 2014-11-04 MED ORDER — PIPERACILLIN-TAZOBACTAM 3.375 G IVPB 30 MIN
3.3750 g | Freq: Three times a day (TID) | INTRAVENOUS | Status: DC
  Administered 2014-11-04: 3.375 g via INTRAVENOUS
  Filled 2014-11-04: qty 50

## 2014-11-04 MED ORDER — ASPIRIN 81 MG PO CHEW: 324.0000 mg | CHEWABLE_TABLET | ORAL | Status: AC

## 2014-11-04 MED ORDER — VANCOMYCIN HCL IN DEXTROSE 1-5 GM/200ML-% IV SOLN
1000.0000 mg | Freq: Once | INTRAVENOUS | Status: DC
  Filled 2014-11-04: qty 200

## 2014-11-04 MED ORDER — PREDNISOLONE ACETATE 1 % OP SUSP
1.0000 [drp] | Freq: Four times a day (QID) | OPHTHALMIC | Status: DC
  Administered 2014-11-04 – 2014-11-10 (×24): 1 [drp] via OPHTHALMIC
  Filled 2014-11-04 (×2): qty 1

## 2014-11-04 MED ORDER — ATORVASTATIN CALCIUM 20 MG PO TABS
20.0000 mg | ORAL_TABLET | Freq: Every day | ORAL | Status: DC
Start: 2014-11-04 — End: 2014-11-10
  Administered 2014-11-05 – 2014-11-10 (×6): 20 mg via ORAL
  Filled 2014-11-04: qty 1
  Filled 2014-11-04: qty 2
  Filled 2014-11-04 (×2): qty 1
  Filled 2014-11-04: qty 2
  Filled 2014-11-04 (×2): qty 1

## 2014-11-04 MED ORDER — VANCOMYCIN HCL 10 G IV SOLR
1250.0000 mg | Freq: Once | INTRAVENOUS | Status: AC
  Administered 2014-11-04: 1250 mg via INTRAVENOUS
  Filled 2014-11-04: qty 1250

## 2014-11-04 MED ORDER — SODIUM CHLORIDE 0.9 % IV SOLN: 250.0000 mL | INTRAVENOUS | Status: DC | PRN

## 2014-11-04 MED ORDER — CLONIDINE HCL 0.1 MG PO TABS
0.1000 mg | ORAL_TABLET | Freq: Two times a day (BID) | ORAL | Status: DC
  Administered 2014-11-05 – 2014-11-06 (×3): 0.1 mg via ORAL
  Filled 2014-11-04 (×3): qty 1

## 2014-11-04 MED ORDER — HEPARIN SODIUM (PORCINE) 5000 UNIT/ML IJ SOLN
5000.0000 [IU] | Freq: Three times a day (TID) | INTRAMUSCULAR | Status: DC
  Administered 2014-11-04 – 2014-11-06 (×6): 5000 [IU] via SUBCUTANEOUS
  Filled 2014-11-04 (×7): qty 1

## 2014-11-04 MED ORDER — PIPERACILLIN-TAZOBACTAM IN DEX 2-0.25 GM/50ML IV SOLN
2.2500 g | Freq: Four times a day (QID) | INTRAVENOUS | Status: DC
  Administered 2014-11-04 – 2014-11-10 (×22): 2.25 g via INTRAVENOUS
  Filled 2014-11-04 (×26): qty 50

## 2014-11-04 MED ORDER — ASPIRIN 300 MG RE SUPP
300.0000 mg | RECTAL | Status: AC
  Administered 2014-11-04: 300 mg via RECTAL
  Filled 2014-11-04: qty 1

## 2014-11-04 MED ORDER — CARVEDILOL 3.125 MG PO TABS
3.1250 mg | ORAL_TABLET | Freq: Two times a day (BID) | ORAL | Status: DC
  Administered 2014-11-05: 3.125 mg via ORAL
  Filled 2014-11-04 (×2): qty 1

## 2014-11-04 MED ORDER — TIMOLOL HEMIHYDRATE 0.5 % OP SOLN: 1.0000 [drp] | Freq: Every morning | OPHTHALMIC | Status: DC

## 2014-11-04 MED ORDER — PANTOPRAZOLE SODIUM 40 MG PO TBEC
40.0000 mg | DELAYED_RELEASE_TABLET | Freq: Every day | ORAL | Status: DC
  Administered 2014-11-05 – 2014-11-10 (×6): 40 mg via ORAL
  Filled 2014-11-04 (×6): qty 1

## 2014-11-04 MED ORDER — ACETAMINOPHEN 650 MG RE SUPP
650.0000 mg | Freq: Once | RECTAL | Status: AC
  Administered 2014-11-04: 650 mg via RECTAL
  Filled 2014-11-04: qty 1

## 2014-11-04 MED ORDER — OXYBUTYNIN CHLORIDE ER 5 MG PO TB24
5.0000 mg | ORAL_TABLET | Freq: Every day | ORAL | Status: DC
  Administered 2014-11-05 – 2014-11-09 (×5): 5 mg via ORAL
  Filled 2014-11-04 (×6): qty 1

## 2014-11-04 MED ORDER — NITROGLYCERIN IN D5W 200-5 MCG/ML-% IV SOLN
0.0000 ug/min | Freq: Once | INTRAVENOUS | Status: AC
  Administered 2014-11-04: 5 ug/min via INTRAVENOUS
  Filled 2014-11-04: qty 250

## 2014-11-04 MED ORDER — POLYVINYL ALCOHOL 1.4 % OP SOLN
2.0000 [drp] | Freq: Two times a day (BID) | OPHTHALMIC | Status: DC
  Administered 2014-11-04 – 2014-11-10 (×12): 2 [drp] via OPHTHALMIC
  Filled 2014-11-04 (×2): qty 15

## 2014-11-04 MED ORDER — LATANOPROST 0.005 % OP SOLN
1.0000 [drp] | Freq: Every day | OPHTHALMIC | Status: DC
  Administered 2014-11-04 – 2014-11-09 (×6): 1 [drp] via OPHTHALMIC
  Filled 2014-11-04 (×3): qty 2.5

## 2014-11-04 MED ORDER — FUROSEMIDE 10 MG/ML IJ SOLN
80.0000 mg | Freq: Once | INTRAMUSCULAR | Status: AC
  Administered 2014-11-04: 80 mg via INTRAVENOUS
  Filled 2014-11-04: qty 8

## 2014-11-04 MED ORDER — CEFAZOLIN SODIUM-DEXTROSE 2-3 GM-% IV SOLR: 2.0000 g | INTRAVENOUS | Status: DC

## 2014-11-04 MED ORDER — HYDRALAZINE HCL 20 MG/ML IJ SOLN: 10.0000 mg | Freq: Once | INTRAMUSCULAR | Status: DC

## 2014-11-04 NOTE — ED Notes (Signed)
Per EMS-went to PCP yesterday-BP elevated-changed BP meds-SOB which started about 6 hours ago-patient refused treatment at [facility]-patient arrived to [facility] on Neb treatment- staff states she has a temp-Rhonchi throughout

## 2014-11-04 NOTE — ED Notes (Signed)
Critical doctor at bedside.

## 2014-11-04 NOTE — Progress Notes (Signed)
Pt received from ED on BIPAP.  Pt tolerating well at this time, RT to monitor and assess as needed.

## 2014-11-04 NOTE — H&P (Signed)
PULMONARY / CRITICAL CARE MEDICINE HISTORY AND PHYSICAL EXAMINATION   Name: [name] MRN: [id number] DOB: 07/06/1925    ADMISSION DATE:  11/04/2014  PRIMARY SERVICE: PCCM  CHIEF COMPLAINT:  dyspnea  BRIEF PATIENT DESCRIPTION: 81 y/o woman with hx of CKD diastolic dysfunction who presents with fever, dyspnea, and hypertension.  SIGNIFICANT EVENTS / STUDIES:  Worsening hypoxia in the ED following fluid administration.  LINES / TUBES: PIVs Foley (9/16)  CULTURES: Urine (9/16) Blood x23 (9/16)  ANTIBIOTICS: Vanc 9/16>> Zosyn 9/16>>  HISTORY OF PRESENT ILLNESS:   [name] is a 81 y/o woman with a complex past medical history listed below who presents with dyspnea and fever. She is accompanied in the ED by her daughter who states that her SNF noted increased WOB, and she was brought to the ED. She had a fever and an elevated WBC and tachycardia, so with clinical concern for sepsis, she was given IVF. However, she was also hypertensive and with increased afterload and challenged with IVF, she developed worsened hypoxia. She improved with lasix and continuous positive airway pressure. PCCM was called for admission.  PAST MEDICAL HISTORY :  Past Medical History  Diagnosis Date  . Senile osteoporosis   . Heart murmur   . Small bowel obstruction   . GERD (gastroesophageal reflux disease)   . Hypertension   . Glaucoma   . Arthritis     Osteoarthritis  . Personal history of fall   . Edema   . Hyperlipidemia   . Diverticulosis of colon (without mention of hemorrhage)   . Other malaise and fatigue   . Hypertonicity of bladder   . Osteoarthrosis, unspecified whether generalized or localized, unspecified site   . Lumbago   . Edema   . Pain in joint, pelvic region and thigh   . Pain in joint, shoulder region   . Flat feet   . Cerebral ischemia 12/19/2012    Microvascular   . Cerebral atrophy 12/19/2012  . Cerebral atherosclerosis 12/19/2012  . RLQ abdominal pain 01/17/2011   . Urine, incontinence, stress female 09/18/2014  . CKD (chronic kidney disease) stage 3, GFR 30-59 ml/min 11/03/2014   Past Surgical History  Procedure Laterality Date  . Tonsillectomy    . Colon surgery  02/1996    Colectomy, partial for diverticular bleed 90%  . Joint replacement Left 12/2000    Left knee; Dr. Wynelle Link  . Breast lumpectomy Bilateral   . Laparoscopic lysis of adhesions  11/15/10    Exploratory   Prior to Admission medications   Medication Sig Start Date End Date Taking? Authorizing Provider  atorvastatin (LIPITOR) 20 MG tablet Take one tablet by mouth once daily for cholesterol 04/21/14  Yes Tiffany L Reed, DO  BETIMOL 0.5 % ophthalmic solution Place 1 drop into both eyes every morning.  09/23/10  Yes Historical Provider, MD  carvedilol (COREG) 3.125 MG tablet Take one tablet by mouth twice daily with a meal 05/06/14  Yes Estill Dooms, MD  cloNIDine (CATAPRES) 0.1 MG tablet Take 0.1 mg by mouth 2 (two) times daily.    Yes Historical Provider, MD  ferrous sulfate 325 (65 FE) MG tablet Take 325 mg by mouth daily with breakfast.   Yes Historical Provider, MD  latanoprost (XALATAN) 0.005 % ophthalmic solution Place 1 drop into both eyes at bedtime.    Yes Historical Provider, MD  losartan (COZAAR) 50 MG tablet One daily to control blood pressure 11/03/14  Yes Estill Dooms, MD  mirtazapine (REMERON)  7.5 MG tablet TAKE 1 TABLET (7.5 MG TOTAL) BY MOUTH AT BEDTIME. NIGHTLY FOR APPETITE 03/15/14  Yes Estill Dooms, MD  omeprazole (PRILOSEC) 20 MG capsule Take 20 mg by mouth daily.   Yes Historical Provider, MD  oxybutynin (DITROPAN-XL) 5 MG 24 hr tablet Take 5 mg by mouth at bedtime.   Yes Historical Provider, MD  polyvinyl alcohol (LIQUIFILM TEARS) 1.4 % ophthalmic solution Place 2 drops into both eyes 2 (two) times daily.   Yes Historical Provider, MD  prednisoLONE acetate (PRED FORTE) 1 % ophthalmic suspension Place 1 drop into the right eye 4 (four) times daily. For 5 Days.   Yes  Historical Provider, MD  vitamin B-12 (CYANOCOBALAMIN) 1000 MCG tablet Take 1,000 mcg by mouth 2 (two) times daily. Take one tablet by mouth twice daily on Mondays,Wednesdays and Fridays   Yes Historical Provider, MD  acetaminophen (TYLENOL) 325 MG tablet Take 2 tablets (650 mg total) by mouth every 6 (six) hours as needed for mild pain (or Fever >/= 101). 08/20/14   Robbie Lis, MD  alum & mag hydroxide-simeth (MAALOX/MYLANTA) 200-200-20 MG/5ML suspension Take by mouth. 38ml every 4 hours as needed for indigestion    Historical Provider, MD  furosemide (LASIX) 40 MG tablet Take one as needed for weight increase 2-3 lbs in 24 hrs.  Or 3-5 lbs in a week 08/27/14   Historical Provider, MD   Allergies  Allergen Reactions  . Sulfa Antibiotics Itching    FAMILY HISTORY:  Family History  Problem Relation Age of Onset  . Cancer Father 42    colon  . Cancer Maternal Aunt     breast cancer   . Cancer Cousin     breast cancer    SOCIAL HISTORY:  reports that she quit smoking about 33 years ago. Her smoking use included Cigarettes. She has a 15 pack-year smoking history. She has never used smokeless tobacco. She reports that she drinks about 0.6 oz of alcohol per week. She reports that she does not use illicit drugs.  REVIEW OF SYSTEMS:  Unable to obtain due to mild AMS.  SUBJECTIVE:   VITAL SIGNS: Temp:  [100.1 F (37.8 C)-104.3 F (40.2 C)] 101.2 F (38.4 C) (09/16 2045) Pulse Rate:  [97-138] 97 (09/16 2045) Resp:  [24-48] 26 (09/16 2045) BP: (119-241)/(78-138) 119/92 mmHg (09/16 2210) SpO2:  [87 %-100 %] 99 % (09/16 2045) FiO2 (%):  [40 %] 40 % (09/16 2024) Weight:  [139 lb 12.4 oz (63.4 kg)] 139 lb 12.4 oz (63.4 kg) (09/16 2024) HEMODYNAMICS:   VENTILATOR SETTINGS: Vent Mode:  [-] BIPAP FiO2 (%):  [40 %] 40 % Set Rate:  [12 bmp] 12 bmp PEEP:  [6 cmH20] 6 cmH20 INTAKE / OUTPUT: Intake/Output      09/16 0701 - 09/17 0700   I.V. (mL/kg) 2050 (32.3)   IV Piggyback 250   Total  Intake(mL/kg) 2300 (36.3)   Urine (mL/kg/hr) 1425   Total Output 1425   Net +875         PHYSICAL EXAMINATION: General:  Elderly woman with BiPAP mask on, in acuate distress. Neuro:  Awake, alert, but distracted and not able to reliably answer questions. HEENT:  MMM Neck: Moderate JVD Cardiovascular:  Occasionally irregular HR (PACs on monitor) Lungs:  CTA Abdomen:  Soft Musculoskeletal:  No joint swelling Skin:  No rashes  LABS:  CBC  Recent Labs Lab 11/04/14 1545  WBC 19.4*  HGB 12.3  HCT 37.9  PLT 216  Coag's No results for input(s): APTT, INR in the last 168 hours. BMET  Recent Labs Lab 11/04/14 1545  NA 140  K 4.0  CL 109  CO2 20*  BUN 37*  CREATININE 2.30*  GLUCOSE 145*   Electrolytes  Recent Labs Lab 11/04/14 1545  CALCIUM 8.9   Sepsis Markers  Recent Labs Lab 11/04/14 1559 11/04/14 1910  LATICACIDVEN 1.92 1.08   ABG  Recent Labs Lab 11/04/14 1630  PHART 7.276*  PCO2ART 38.0  PO2ART 211*   Liver Enzymes  Recent Labs Lab 11/04/14 1545  AST 21  ALT 13*  ALKPHOS 55  BILITOT 0.7  ALBUMIN 4.4   Cardiac Enzymes  Recent Labs Lab 11/04/14 1545  TROPONINI 0.19*   Glucose No results for input(s): GLUCAP in the last 168 hours.  Imaging Dg Chest Port 1 View  11/04/2014   CLINICAL DATA:  Fever, cough.  EXAM: PORTABLE CHEST - 1 VIEW  COMPARISON:  August 16, 2014.  FINDINGS: Stable cardiomediastinal silhouette. Stable mild central pulmonary vascular congestion is noted. No pneumothorax or pleural effusion is noted. Degenerative changes seen involving both glenohumeral joints. Bilateral perihilar edema is noted.  IMPRESSION: Stable cardiomegaly. Mild central pulmonary vascular congestion is noted with probable bilateral perihilar edema.   Electronically Signed   By: Marijo Conception, M.D.   On: 11/04/2014 16:45    EKG: Sinus rhythm with frequent PACs CXR: Bilateral patchy airspace disease  ASSESSMENT / PLAN:  Active Problems:    Flash pulmonary edema   PULMONARY A: Flash Pulmonary Edmea Vs possible early ARDS Possible sepsis of pulmonary origin P:   Will treat with BiPAP for now, consider intubation if patient declines. Will cover for HCAP.  CARDIOVASCULAR A: Hypertension with flash pulmonary edema Frequent PACs Volume Overload with troponinemia P:   Will restart most of home meds, but with sepsis, concerned that patient will develop shock physiology. Recheck troponins  RENAL A: Metabolic Acidosis without respiratory compensation AKI on CKD P:   Will trend  GASTROINTESTINAL A: No active issues P:    HEMATOLOGIC A: Leukocytosis P:   Likely due to sepsis/stress.  INFECTIOUS A: Sepsis of unclear source, favor pulmonary P:   F/u cultures Respiratory virus panel  ENDOCRINE A: No issues P:    NEUROLOGIC A: Mild dilerium P:   Will follow.  BEST PRACTICE / DISPOSITION Level of Care:  ICU Primary Service:  PCCM Consultants:  None Code Status:  Full Diet:  NPO DVT Px:  Heparin GI Px:  None Skin Integrity:  Intact Social / Family:  Updated on admission  TODAY'S SUMMARY: 81 y/o woman with cardiac dz who presents with sepsis of pulmonary origina nd volume overload.  I have personally obtained a history, examined the patient, evaluated laboratory and imaging results, formulated the assessment and plan and placed orders.  CRITICAL CARE: The patient is critically ill with multiple organ systems failure and requires high complexity decision making for assessment and support, frequent evaluation and titration of therapies, application of advanced monitoring technologies and extensive interpretation of multiple databases. Critical Care Time devoted to patient care services described in this note is 76 minutes.   Luz Brazen, MD Pulmonary and [place] Pager: [phone]   11/04/2014, 10:19 PM

## 2014-11-04 NOTE — ED Notes (Signed)
MD Pollina aware goal guidelines for Nitroglycerin gtt are chest pain related. Patient denies chest pain. MD would like diastolic less than [id number] as goal. Will continue to reassess patient's vital signs and symptoms.

## 2014-11-04 NOTE — Clinical Social Work Note (Signed)
Clinical Social Work Assessment  Patient Details  Name: [name] MRN: [id number] Date of Birth: 1925-08-14  Date of referral:  11/04/14               Reason for consult:   (Patient is from facility)                Permission sought to share information with:   (None.) Permission granted to share information::  No  Name::        Agency::     Relationship::     Contact Information:     Housing/Transportation Living arrangements for the past 2 months:  [place] of Information:  Patient, Adult Children Patient Interpreter Needed:  None Criminal Activity/Legal Involvement Pertinent to Current Situation/Hospitalization:  No - Comment as needed Significant Relationships:  Adult Children Lives with:  Facility Resident Do you feel safe going back to the place where you live?    Need for family participation in patient care:   (Daughter states that she is primary support for patient.)  Care giving concerns:  There are no care giving concerns at this time. The patient is currently a resident at [facility]    Social Worker assessment / plan:  CSW met with patient at bedside. Daughter was present. Patient and daughter confirm that the patient is from [facility], and present to [facility] due to SOB and fever.  Daughter informed CSW that the patient has been living at the facility since May 2013. She states that the patient is currently living in the facilities independent unit. Daughter informed CSW that the patient receives assistance with completing ADL's. Also, she states that the patient falls often.  Daughter informed CSW that she and patient's son are primary support for patient. She states that son is POA, and that the patient has a great support system.  Daughter/ [name] [phone]    Employment status:  Retired Forensic scientist:   Nurse, mental health) PT Recommendations:   (Patient is currently at a [facility].. Daughter  states if reha is needed  the patient can go to their facility's Skilled nursing unit.) Information / Referral to community resources:     Patient/Family's Response to care:  Patient and daughter's response are appropriate at this time. Both are aware that patient will be admitted.   Patient/Family's Understanding of and Emotional Response to Diagnosis, Current Treatment, and Prognosis:  Patient and family are understanding at this time. They do not have any questions for CSW.  Emotional Assessment Appearance:  Appears stated age Attitude/Demeanor/Rapport:   (Appropriate.) Affect (typically observed):  Accepting, Appropriate Orientation:  Oriented to Self, Oriented to Place, Oriented to  Time, Oriented to Situation Alcohol / Substance use:  Not Applicable Psych involvement (Current and /or in the community):  No (Comment)  Discharge Needs  Concerns to be addressed:  Adjustment to Illness Readmission within the last 30 days:  No Current discharge risk:  None Barriers to Discharge:  No Barriers Identified   Beth Buffy, LCSW 11/04/2014, 8:03 PM

## 2014-11-04 NOTE — Progress Notes (Signed)
ANTIBIOTIC CONSULT NOTE - INITIAL  Pharmacy Consult for Vancomycin and Zosyn Indication: HCAP, Sepsis  Allergies  Allergen Reactions  . Sulfa Antibiotics Itching    Patient Measurements:   Height: 66 inches Weight: 62.1 kg  Vital Signs: Temp: 104.3 F (40.2 C) (09/16 1541) Temp Source: Rectal (09/16 1541) BP: 241/138 mmHg (09/16 1531) Pulse Rate: 134 (09/16 1531) Intake/Output from previous day:   Intake/Output from this shift:    Labs: No results for input(s): WBC, HGB, PLT, LABCREA, CREATININE in the last 72 hours. Estimated Creatinine Clearance: 13.7 mL/min (by C-G formula based on Cr of 2.7). No results for input(s): VANCOTROUGH, VANCOPEAK, VANCORANDOM, GENTTROUGH, GENTPEAK, GENTRANDOM, TOBRATROUGH, TOBRAPEAK, TOBRARND, AMIKACINPEAK, AMIKACINTROU, AMIKACIN in the last 72 hours.   Microbiology: No results found for this or any previous visit (from the past 720 hour(s)).  Medical History: Past Medical History  Diagnosis Date  . Senile osteoporosis   . Heart murmur   . Small bowel obstruction   . GERD (gastroesophageal reflux disease)   . Hypertension   . Glaucoma   . Arthritis     Osteoarthritis  . Personal history of fall   . Edema   . Hyperlipidemia   . Diverticulosis of colon (without mention of hemorrhage)   . Other malaise and fatigue   . Hypertonicity of bladder   . Osteoarthrosis, unspecified whether generalized or localized, unspecified site   . Lumbago   . Edema   . Pain in joint, pelvic region and thigh   . Pain in joint, shoulder region   . Flat feet   . Cerebral ischemia 12/19/2012    Microvascular   . Cerebral atrophy 12/19/2012  . Cerebral atherosclerosis 12/19/2012  . RLQ abdominal pain 01/17/2011  . Urine, incontinence, stress female 09/18/2014  . CKD (chronic kidney disease) stage 3, GFR 30-59 ml/min 11/03/2014    Medications:  Scheduled:  . nitroGLYCERIN  0-200 mcg/min Intravenous Once   Infusions:  . piperacillin-tazobactam 3.375  g (11/04/14 1601)  . sodium chloride 1,000 mL (11/04/14 1601)  . vancomycin     PRN:   Assessment: 81 yo female nursing home patient presents to [facility] with shortness of breath. Code sepsis called, rectal temp 104.3, tachycardic, tachypnic.  Pharmacy consulted to dose vancomycin and Zosyn for HCAP, sepsis.  Goal of Therapy:  Vancomycin trough level 15-20 mcg/ml  Zosyn dosing per renal function  Plan:   Vancomycin 1250g (20 mg/kg) IV x 1 dose in ED  Check random vancomycin level 24 hrs after above dose and redose if < or = 15 mcg/ml Zosyn 3.375gm IV x 1 in ED, then 2.25g IV q6h for CrCl < 20 ml/min Follow up renal function & cultures, clinical course  Peggyann Juba, PharmD, BCPS Pager: [phone] 11/04/2014,4:03 PM

## 2014-11-04 NOTE — ED Provider Notes (Addendum)
CSN: [id number]     Arrival date & time 11/04/14  1517 History   First MD Initiated Contact with Patient 11/04/14 1523     Chief Complaint  Patient presents with  . Shortness of Breath  . Hypertension  . Fever     (Consider location/radiation/quality/duration/timing/severity/associated sxs/prior Treatment) HPI Comments: Presents to the emergency department for evaluation of difficulty breathing. Patient was reportedly seen by her primary care provider yesterday in the office and was noted to have elevated blood pressure. Blood pressure medications were changed. She was noted to be short of breath earlier today. She did not want to come to the hospital or see her doctor today, reportedly avoided staff all morning. This afternoon was noted to be insignificant distress with her breathing. She reportedly had a fever at the nursing home. Patient exhibiting mild respiratory distress at arrival, not answering questions. Level V Caveat due to respiratory distress.   Past Medical History  Diagnosis Date  . Senile osteoporosis   . Heart murmur   . Small bowel obstruction   . GERD (gastroesophageal reflux disease)   . Hypertension   . Glaucoma   . Arthritis     Osteoarthritis  . Personal history of fall   . Edema   . Hyperlipidemia   . Diverticulosis of colon (without mention of hemorrhage)   . Other malaise and fatigue   . Hypertonicity of bladder   . Osteoarthrosis, unspecified whether generalized or localized, unspecified site   . Lumbago   . Edema   . Pain in joint, pelvic region and thigh   . Pain in joint, shoulder region   . Flat feet   . Cerebral ischemia 12/19/2012    Microvascular   . Cerebral atrophy 12/19/2012  . Cerebral atherosclerosis 12/19/2012  . RLQ abdominal pain 01/17/2011  . Urine, incontinence, stress female 09/18/2014  . CKD (chronic kidney disease) stage 3, GFR 30-59 ml/min 11/03/2014   Past Surgical History  Procedure Laterality Date  . Tonsillectomy    .  Colon surgery  02/1996    Colectomy, partial for diverticular bleed 90%  . Joint replacement Left 12/2000    Left knee; Dr. Wynelle Link  . Breast lumpectomy Bilateral   . Laparoscopic lysis of adhesions  11/15/10    Exploratory   Family History  Problem Relation Age of Onset  . Cancer Father 51    colon  . Cancer Maternal Aunt     breast cancer   . Cancer Cousin     breast cancer    Social History  Substance Use Topics  . Smoking status: Former Smoker -- 0.50 packs/day for 30 years    Types: Cigarettes    Quit date: 12/16/1980  . Smokeless tobacco: Never Used  . Alcohol Use: 0.6 oz/week    1 Glasses of wine per week     Comment: occasional glass of wine   OB History    No data available     Review of Systems  Unable to perform ROS: Severe respiratory distress      Allergies  Sulfa antibiotics  Home Medications   Prior to Admission medications   Medication Sig Start Date End Date Taking? Authorizing Provider  acetaminophen (TYLENOL) 325 MG tablet Take 2 tablets (650 mg total) by mouth every 6 (six) hours as needed for mild pain (or Fever >/= 101). 08/20/14   Robbie Lis, MD  alum & mag hydroxide-simeth (MAALOX/MYLANTA) 200-200-20 MG/5ML suspension Take by mouth. 80ml every 4 hours as needed for  indigestion    Historical Provider, MD  atorvastatin (LIPITOR) 20 MG tablet Take one tablet by mouth once daily for cholesterol 04/21/14   Tiffany L Reed, DO  BETIMOL 0.5 % ophthalmic solution Place 1 drop into both eyes every morning.  09/23/10   Historical Provider, MD  carvedilol (COREG) 3.125 MG tablet Take one tablet by mouth twice daily with a meal 05/06/14   Estill Dooms, MD  cloNIDine (CATAPRES) 0.1 MG tablet Take 0.1 mg by mouth. Take one tablet by mouth twice daily    Historical Provider, MD  ferrous sulfate 325 (65 FE) MG tablet Take 325 mg by mouth daily with breakfast.    Historical Provider, MD  furosemide (LASIX) 40 MG tablet Take one as needed for weight increase 2-3  lbs in 24 hrs.  Or 3-5 lbs in a week 08/27/14   Historical Provider, MD  hydroxypropyl methylcellulose / hypromellose (ISOPTO TEARS / GONIOVISC) 2.5 % ophthalmic solution Place 2 drops into both eyes 2 (two) times daily.    Historical Provider, MD  latanoprost (XALATAN) 0.005 % ophthalmic solution Place 1 drop into both eyes at bedtime.     Historical Provider, MD  losartan (COZAAR) 50 MG tablet One daily to control blood pressure 11/03/14   Estill Dooms, MD  mirtazapine (REMERON) 7.5 MG tablet TAKE 1 TABLET (7.5 MG TOTAL) BY MOUTH AT BEDTIME. NIGHTLY FOR APPETITE 03/15/14   Estill Dooms, MD  omeprazole (PRILOSEC) 20 MG capsule Take 20 mg by mouth daily.    Historical Provider, MD  oxybutynin (DITROPAN-XL) 5 MG 24 hr tablet Take 5 mg by mouth at bedtime.    Historical Provider, MD  vitamin B-12 (CYANOCOBALAMIN) 1000 MCG tablet Take 1,000 mcg by mouth 2 (two) times daily. Take one tablet by mouth twice daily on Mondays,Wednesdays and Fridays    Historical Provider, MD   BP 168/98 mmHg  Pulse 122  Temp(Src) 104.3 F (40.2 C) (Rectal)  Resp 25  SpO2 93% Physical Exam  Constitutional: She appears well-developed and well-nourished. She appears distressed.  HENT:  Head: Normocephalic and atraumatic.  Right Ear: Hearing normal.  Left Ear: Hearing normal.  Nose: Nose normal.  Mouth/Throat: Oropharynx is clear and moist and mucous membranes are normal.  Eyes: Conjunctivae and EOM are normal. Pupils are equal, round, and reactive to light.  Neck: Normal range of motion. Neck supple.  Cardiovascular: Regular rhythm, S1 normal and S2 normal.  Exam reveals no gallop and no friction rub.   No murmur heard. Pulmonary/Chest: She is in respiratory distress. She has decreased breath sounds. She has wheezes. She has rhonchi. She has rales. She exhibits no tenderness.  Abdominal: Soft. Normal appearance and bowel sounds are normal. There is no hepatosplenomegaly. There is no tenderness. There is no rebound,  no guarding, no tenderness at McBurney's point and negative Murphy's sign. No hernia.  Musculoskeletal: Normal range of motion.  Neurological: She is alert. She has normal strength. No cranial nerve deficit or sensory deficit. Coordination normal. GCS motor subscore is 6.  Skin: Skin is warm, dry and intact. No rash noted. No cyanosis.  Psychiatric: She has a normal mood and affect. Her speech is normal and behavior is normal. Thought content normal.  Nursing note and vitals reviewed.   ED Course  Procedures (including critical care time) Labs Review Labs Reviewed  COMPREHENSIVE METABOLIC PANEL - Abnormal; Notable for the following:    CO2 20 (*)    Glucose, Bld 145 (*)    BUN  37 (*)    Creatinine, Ser 2.30 (*)    ALT 13 (*)    GFR calc non Af Amer 18 (*)    GFR calc Af Amer 21 (*)    All other components within normal limits  CBC WITH DIFFERENTIAL/PLATELET - Abnormal; Notable for the following:    WBC 19.4 (*)    Neutro Abs 16.8 (*)    All other components within normal limits  URINALYSIS, ROUTINE W REFLEX MICROSCOPIC (NOT AT [facility]) - Abnormal; Notable for the following:    Hgb urine dipstick SMALL (*)    Protein, ur 100 (*)    All other components within normal limits  BRAIN NATRIURETIC PEPTIDE - Abnormal; Notable for the following:    B Natriuretic Peptide 618.3 (*)    All other components within normal limits  TROPONIN I - Abnormal; Notable for the following:    Troponin I 0.19 (*)    All other components within normal limits  BLOOD GAS, ARTERIAL - Abnormal; Notable for the following:    pH, Arterial 7.276 (*)    pO2, Arterial 211 (*)    Bicarbonate 16.4 (*)    Acid-base deficit 8.4 (*)    All other components within normal limits  CULTURE, BLOOD (ROUTINE X 2)  CULTURE, BLOOD (ROUTINE X 2)  URINE CULTURE  URINE MICROSCOPIC-ADD ON  I-STAT CG4 LACTIC ACID, ED    Imaging Review Dg Chest Port 1 View  11/04/2014   CLINICAL DATA:  Fever, cough.  EXAM: PORTABLE CHEST - 1  VIEW  COMPARISON:  August 16, 2014.  FINDINGS: Stable cardiomediastinal silhouette. Stable mild central pulmonary vascular congestion is noted. No pneumothorax or pleural effusion is noted. Degenerative changes seen involving both glenohumeral joints. Bilateral perihilar edema is noted.  IMPRESSION: Stable cardiomegaly. Mild central pulmonary vascular congestion is noted with probable bilateral perihilar edema.   Electronically Signed   By: Marijo Conception, M.D.   On: 11/04/2014 16:45   I have personally reviewed and evaluated these images and lab results as part of my medical decision-making.   EKG Interpretation   Date/Time:  Friday November 04 2014 15:43:40 EDT Ventricular Rate:  140 PR Interval:  138 QRS Duration: 81 QT Interval:  280 QTC Calculation: 427 R Axis:   0 Text Interpretation:  Sinus tachycardia Multiple ventricular premature  complexes Left ventricular hypertrophy Nonspecific ST and T wave  abnormality Confirmed by POLLINA  MD, CHRISTOPHER [phone]) on 11/04/2014  4:21:02 PM      MDM   Final diagnoses:  Sepsis, due to unspecified organism  Elevated troponin    Patient not to the ER from assisted living for evaluation of shortness of breath. Patient reportedly has developed progressively worsening shortness of breath over the course of today. She has had a cough that has been productive of thick sputum for the last several days. On arrival to the ER she was in respiratory distress with decreased breath sounds, wheezing, rales and rhonchi evident. She had been given a treatment by EMS without improvement.  Patient was seen by her doctor yesterday and was noted to have elevated blood pressure. She had losartan added to her clonidine and Coreg. At arrival to the ER today she is markedly hypertensive. She was initiated on nitroglycerin drip because of elevated blood pressure, shortness of breath with possible CHF. Additionally, patient did have EKG changes. She was not  experiencing active chest pain, but did have subtle elevations anteriorly with Q waves. Additionally there were nonspecific depressions laterally.  This was discussed with Dr. Burt Knack, on call for cardiology. He did not feel that this was consistent with STEMI, recommended medical management of the other problems. Patient ultimately was found to have elevated troponin at 0.19, likely demand secondary to tachycardia, fever and sepsis.  Chest x-ray mild vascular congestion, no obvious pneumonia. ABG does not show CO2 retention. She is oxygenating well. BNP is elevated at 618. Patient was empirically administered IV fluids per sepsis protocol and her oxygen saturation is dropping with this fluid. It is felt that this likely represents congestive heart failure. Reviewing her records reveals that she has a normal ejection fraction, but does use Lasix when necessary weight gain.  Case discussed with Dr. Ashok Cordia, on call for critical care. He recommends administration of IV Lasix. He also recommends intubation rather than BiPAP. I did discuss this with the family. They were hesitant to place the patient on ventilator support. She apparently has a living will and they think she might be a DO NOT RESUSCITATE, although there is no documentation of such. Patient does appear to be stabilizing with BiPAP, family wishes to continue current measures before escalating. Critical care will evaluate the patient for further management.    CRITICAL CARE Performed by: Orpah Greek   Total critical care time: 31min  Critical care time was exclusive of separately billable procedures and treating other patients.  Critical care was necessary to treat or prevent imminent or life-threatening deterioration.  Critical care was time spent personally by me on the following activities: development of treatment plan with patient and/or surrogate as well as nursing, discussions with consultants, evaluation of patient's  response to treatment, examination of patient, obtaining history from patient or surrogate, ordering and performing treatments and interventions, ordering and review of laboratory studies, ordering and review of radiographic studies, pulse oximetry and re-evaluation of patient's condition.     Orpah Greek, MD 11/04/14 1716  Orpah Greek, MD 11/04/14 [phone]

## 2014-11-04 NOTE — ED Notes (Signed)
MD at bedside.  Intensivist

## 2014-11-05 ENCOUNTER — Inpatient Hospital Stay (HOSPITAL_COMMUNITY): Payer: Medicare Other

## 2014-11-05 ENCOUNTER — Other Ambulatory Visit (HOSPITAL_COMMUNITY): Payer: Medicare Other

## 2014-11-05 DIAGNOSIS — I509 Heart failure, unspecified: Secondary | ICD-10-CM

## 2014-11-05 DIAGNOSIS — J810 Acute pulmonary edema: Secondary | ICD-10-CM

## 2014-11-05 DIAGNOSIS — R739 Hyperglycemia, unspecified: Secondary | ICD-10-CM

## 2014-11-05 DIAGNOSIS — I10 Essential (primary) hypertension: Secondary | ICD-10-CM

## 2014-11-05 DIAGNOSIS — R7989 Other specified abnormal findings of blood chemistry: Secondary | ICD-10-CM

## 2014-11-05 DIAGNOSIS — J9601 Acute respiratory failure with hypoxia: Secondary | ICD-10-CM

## 2014-11-05 LAB — RENAL FUNCTION PANEL
ALBUMIN: 3.4 g/dL — AB (ref 3.5–5.0)
Anion gap: 10 (ref 5–15)
BUN: 37 mg/dL — AB (ref 6–20)
CALCIUM: 8.1 mg/dL — AB (ref 8.9–10.3)
CO2: 23 mmol/L (ref 22–32)
Chloride: 109 mmol/L (ref 101–111)
Creatinine, Ser: 2.51 mg/dL — ABNORMAL HIGH (ref 0.44–1.00)
GFR calc Af Amer: 19 mL/min — ABNORMAL LOW (ref >60)
GFR, EST NON AFRICAN AMERICAN: 16 mL/min — AB (ref >60)
Glucose, Bld: 125 mg/dL — ABNORMAL HIGH (ref 65–99)
PHOSPHORUS: 4.9 mg/dL — AB (ref 2.5–4.6)
POTASSIUM: 3.8 mmol/L (ref 3.5–5.1)
SODIUM: 142 mmol/L (ref 135–145)

## 2014-11-05 LAB — TROPONIN I
Troponin I: 12.13 ng/mL (ref <0.031)
Troponin I: 10.28 ng/mL (ref <0.031)
Troponin I: 9.97 ng/mL (ref <0.031)

## 2014-11-05 LAB — GLUCOSE, CAPILLARY
GLUCOSE-CAPILLARY: 101 mg/dL — AB (ref 65–99)
GLUCOSE-CAPILLARY: 116 mg/dL — AB (ref 65–99)
GLUCOSE-CAPILLARY: 94 mg/dL (ref 65–99)
Glucose-Capillary: 95 mg/dL (ref 65–99)

## 2014-11-05 LAB — CBC WITH DIFFERENTIAL/PLATELET
BASOS ABS: 0 10*3/uL (ref 0.0–0.1)
BASOS PCT: 0 %
EOS ABS: 0.1 10*3/uL (ref 0.0–0.7)
EOS PCT: 0 %
HCT: 35.4 % — ABNORMAL LOW (ref 36.0–46.0)
Hemoglobin: 11.2 g/dL — ABNORMAL LOW (ref 12.0–15.0)
Lymphocytes Relative: 6 %
Lymphs Abs: 0.9 10*3/uL (ref 0.7–4.0)
MCH: 29.3 pg (ref 26.0–34.0)
MCHC: 31.6 g/dL (ref 30.0–36.0)
MCV: 92.7 fL (ref 78.0–100.0)
MONO ABS: 1.2 10*3/uL — AB (ref 0.1–1.0)
Monocytes Relative: 8 %
NEUTROS ABS: 13.3 10*3/uL — AB (ref 1.7–7.7)
Neutrophils Relative %: 86 %
PLATELETS: ADEQUATE 10*3/uL (ref 150–400)
RBC: 3.82 MIL/uL — ABNORMAL LOW (ref 3.87–5.11)
RDW: 14.8 % (ref 11.5–15.5)
WBC: 15.4 10*3/uL — ABNORMAL HIGH (ref 4.0–10.5)

## 2014-11-05 LAB — PROCALCITONIN: PROCALCITONIN: 54.37 ng/mL

## 2014-11-05 LAB — LACTIC ACID, PLASMA: LACTIC ACID, VENOUS: 2.1 mmol/L — AB (ref 0.5–2.0)

## 2014-11-05 LAB — BRAIN NATRIURETIC PEPTIDE: B NATRIURETIC PEPTIDE 5: 2900.3 pg/mL — AB (ref 0.0–100.0)

## 2014-11-05 LAB — MAGNESIUM: MAGNESIUM: 1.6 mg/dL — AB (ref 1.7–2.4)

## 2014-11-05 LAB — VANCOMYCIN, RANDOM: Vancomycin Rm: 16 ug/mL

## 2014-11-05 LAB — MRSA PCR SCREENING: MRSA BY PCR: NEGATIVE

## 2014-11-05 MED ORDER — CARVEDILOL 6.25 MG PO TABS
6.2500 mg | ORAL_TABLET | Freq: Two times a day (BID) | ORAL | Status: DC
Start: 2014-11-05 — End: 2014-11-06
  Administered 2014-11-05 – 2014-11-06 (×2): 6.25 mg via ORAL
  Filled 2014-11-05 (×2): qty 1

## 2014-11-05 MED ORDER — MAGNESIUM SULFATE 2 GM/50ML IV SOLN
2.0000 g | Freq: Once | INTRAVENOUS | Status: AC
  Administered 2014-11-05: 2 g via INTRAVENOUS
  Filled 2014-11-05: qty 50

## 2014-11-05 MED ORDER — FUROSEMIDE 20 MG PO TABS
20.0000 mg | ORAL_TABLET | Freq: Once | ORAL | Status: AC
  Administered 2014-11-05: 20 mg via ORAL
  Filled 2014-11-05: qty 1

## 2014-11-05 MED ORDER — INSULIN ASPART 100 UNIT/ML ~~LOC~~ SOLN: 0.0000 [IU] | SUBCUTANEOUS | Status: DC

## 2014-11-05 MED ORDER — VANCOMYCIN HCL IN DEXTROSE 1-5 GM/200ML-% IV SOLN
1000.0000 mg | Freq: Once | INTRAVENOUS | Status: AC
  Administered 2014-11-05: 1000 mg via INTRAVENOUS
  Filled 2014-11-05: qty 200

## 2014-11-05 NOTE — Progress Notes (Signed)
Pharmacy Consult Note - Vancomycin Follow Up  Labs: Vanc Random Level 16  A/P: Vanc level 16 after dose of 1250mg  given 9/16 at 1633. Generally redose when level is < 15 (goal trough 15-20) but will give lower dose than yesterday of 1g tonight as redose and recheck random vanc level prn  Adrian Saran, PharmD, BCPS Pager [phone] 11/05/2014 6:28 PM

## 2014-11-05 NOTE — Progress Notes (Signed)
ANTIBIOTIC CONSULT NOTE - Follow up  Pharmacy Consult for Vancomycin and Zosyn Indication: HCAP, Sepsis  Allergies  Allergen Reactions  . Sulfa Antibiotics Itching    Patient Measurements: Height: 5\' 5"  (165.1 cm) Weight: 137 lb 2 oz (62.2 kg) IBW/kg (Calculated) : 57  Vital Signs: Temp: 99.3 F (37.4 C) (09/17 1400) Temp Source: Core (Comment) (09/17 0400) BP: 142/76 mmHg (09/17 1400) Pulse Rate: 67 (09/17 1400) Intake/Output from previous day: 09/16 0701 - 09/17 0700 In: 2442.4 [I.V.:2092.4; IV Piggyback:350] Out: 2000 [Urine:2000]  Labs:  Recent Labs  11/04/14 1545 11/05/14 0808  WBC 19.4* 15.4*  HGB 12.3 11.2*  PLT 216 PLATELET CLUMPS NOTED ON SMEAR, COUNT APPEARS ADEQUATE  CREATININE 2.30* 2.51*   Estimated Creatinine Clearance: 14.2 mL/min (by C-G formula based on Cr of 2.51). No results for input(s): VANCOTROUGH, VANCOPEAK, VANCORANDOM, GENTTROUGH, GENTPEAK, GENTRANDOM, TOBRATROUGH, TOBRAPEAK, TOBRARND, AMIKACINPEAK, AMIKACINTROU, AMIKACIN in the last 72 hours.   Assessment: 81 yo female nursing home patient presents to [facility] with shortness of breath. Code sepsis called, rectal temp 104.3, tachycardic, tachypnic.  Pharmacy consulted to dose vancomycin and Zosyn for HCAP, sepsis.  Today, 11/05/2014:  Lactic acid increased to 2.1  PCT 54.37  Tm 104.3  WBC elevated but improved at 15.4  SCr increased (2.3 > 2.5) with CrCl ~ 14 ml/min  Blood and urine cultures in process.  Respiratory virus panel in process.   Goal of Therapy:  Vancomycin trough level 15-20 mcg/ml  Appropriate abx dosing, eradication of infection.   Plan:   Continue Zosyn 2.25g IV q6h  Vancomycin random level at 1700 Pharmacy to redose vancomycin based on levels Follow up renal function & cultures, clinical course   Gretta Arab PharmD, BCPS Pager [phone] 11/05/2014 2:52 PM

## 2014-11-05 NOTE — Consult Note (Signed)
Referring Physician:  [name] is an 81 y.o. female.                       Chief Complaint: Fever, dyspnea and hypertension  HPI: 81 year old NH resident with probable DNR, Breast cancer- ? untreated, hypertension, arthritis, CKD, stage III, Hyperlipidemia, Glaucoma and Senile osteoporosis presented with dyspnea and fever. IV lasix improved her increased fluid load in ER and decreased dyspnea. Blood work showed elevated Troponin-I and BNP, Echocardiogram showed Antero-septal wall severe hypokinesia and EKG showed ST elevation in lead V-2 only. Currently patient is stable and without chest pain. She and daughter prefer medical therapy for now.   Past Medical History  Diagnosis Date  . Senile osteoporosis   . Heart murmur   . Small bowel obstruction   . GERD (gastroesophageal reflux disease)   . Hypertension   . Glaucoma   . Arthritis     Osteoarthritis  . Personal history of fall   . Edema   . Hyperlipidemia   . Diverticulosis of colon (without mention of hemorrhage)   . Other malaise and fatigue   . Hypertonicity of bladder   . Osteoarthrosis, unspecified whether generalized or localized, unspecified site   . Lumbago   . Edema   . Pain in joint, pelvic region and thigh   . Pain in joint, shoulder region   . Flat feet   . Cerebral ischemia 12/19/2012    Microvascular   . Cerebral atrophy 12/19/2012  . Cerebral atherosclerosis 12/19/2012  . RLQ abdominal pain 01/17/2011  . Urine, incontinence, stress female 09/18/2014  . CKD (chronic kidney disease) stage 3, GFR 30-59 ml/min 11/03/2014      Past Surgical History  Procedure Laterality Date  . Tonsillectomy    . Colon surgery  02/1996    Colectomy, partial for diverticular bleed 90%  . Joint replacement Left 12/2000    Left knee; Dr. Wynelle Link  . Breast lumpectomy Bilateral   . Laparoscopic lysis of adhesions  11/15/10    Exploratory    Family History  Problem Relation Age of Onset  . Cancer Father 38    colon  .  Cancer Maternal Aunt     breast cancer   . Cancer Cousin     breast cancer    Social History:  reports that she quit smoking about 33 years ago. Her smoking use included Cigarettes. She has a 15 pack-year smoking history. She has never used smokeless tobacco. She reports that she drinks about 0.6 oz of alcohol per week. She reports that she does not use illicit drugs.  Allergies:  Allergies  Allergen Reactions  . Sulfa Antibiotics Itching    Medications Prior to Admission  Medication Sig Dispense Refill  . atorvastatin (LIPITOR) 20 MG tablet Take one tablet by mouth once daily for cholesterol 30 tablet 3  . BETIMOL 0.5 % ophthalmic solution Place 1 drop into both eyes every morning.     . carvedilol (COREG) 3.125 MG tablet Take one tablet by mouth twice daily with a meal 60 tablet 5  . cloNIDine (CATAPRES) 0.1 MG tablet Take 0.1 mg by mouth 2 (two) times daily.     . ferrous sulfate 325 (65 FE) MG tablet Take 325 mg by mouth daily with breakfast.    . latanoprost (XALATAN) 0.005 % ophthalmic solution Place 1 drop into both eyes at bedtime.     Marland Kitchen losartan (COZAAR) 50 MG tablet One daily to control blood  pressure 30 tablet 5  . mirtazapine (REMERON) 7.5 MG tablet TAKE 1 TABLET (7.5 MG TOTAL) BY MOUTH AT BEDTIME. NIGHTLY FOR APPETITE 30 tablet 3  . omeprazole (PRILOSEC) 20 MG capsule Take 20 mg by mouth daily.    Marland Kitchen oxybutynin (DITROPAN-XL) 5 MG 24 hr tablet Take 5 mg by mouth at bedtime.    . polyvinyl alcohol (LIQUIFILM TEARS) 1.4 % ophthalmic solution Place 2 drops into both eyes 2 (two) times daily.    . prednisoLONE acetate (PRED FORTE) 1 % ophthalmic suspension Place 1 drop into the right eye 4 (four) times daily. For 5 Days.    . vitamin B-12 (CYANOCOBALAMIN) 1000 MCG tablet Take 1,000 mcg by mouth 2 (two) times daily. Take one tablet by mouth twice daily on Mondays,Wednesdays and Fridays    . acetaminophen (TYLENOL) 325 MG tablet Take 2 tablets (650 mg total) by mouth every 6 (six)  hours as needed for mild pain (or Fever >/= 101). 30 tablet 0  . alum & mag hydroxide-simeth (MAALOX/MYLANTA) 200-200-20 MG/5ML suspension Take by mouth. 47m every 4 hours as needed for indigestion    . furosemide (LASIX) 40 MG tablet Take one as needed for weight increase 2-3 lbs in 24 hrs.  Or 3-5 lbs in a week      Results for orders placed or performed during the hospital encounter of 11/04/14 (from the past 48 hour(s))  Comprehensive metabolic panel     Status: Abnormal   Collection Time: 11/04/14  3:45 PM  Result Value Ref Range   Sodium 140 135 - 145 mmol/L   Potassium 4.0 3.5 - 5.1 mmol/L   Chloride 109 101 - 111 mmol/L   CO2 20 (L) 22 - 32 mmol/L   Glucose, Bld 145 (H) 65 - 99 mg/dL   BUN 37 (H) 6 - 20 mg/dL   Creatinine, Ser 2.30 (H) 0.44 - 1.00 mg/dL   Calcium 8.9 8.9 - 10.3 mg/dL   Total Protein 7.5 6.5 - 8.1 g/dL   Albumin 4.4 3.5 - 5.0 g/dL   AST 21 15 - 41 U/L   ALT 13 (L) 14 - 54 U/L   Alkaline Phosphatase 55 38 - 126 U/L   Total Bilirubin 0.7 0.3 - 1.2 mg/dL   GFR calc non Af Amer 18 (L) >60 mL/min   GFR calc Af Amer 21 (L) >60 mL/min    Comment: (NOTE) The eGFR has been calculated using the CKD EPI equation. This calculation has not been validated in all clinical situations. eGFR's persistently <60 mL/min signify possible Chronic Kidney Disease.    Anion gap 11 5 - 15  CBC WITH DIFFERENTIAL     Status: Abnormal   Collection Time: 11/04/14  3:45 PM  Result Value Ref Range   WBC 19.4 (H) 4.0 - 10.5 K/uL   RBC 4.06 3.87 - 5.11 MIL/uL   Hemoglobin 12.3 12.0 - 15.0 g/dL   HCT 37.9 36.0 - 46.0 %   MCV 93.3 78.0 - 100.0 fL   MCH 30.3 26.0 - 34.0 pg   MCHC 32.5 30.0 - 36.0 g/dL   RDW 14.6 11.5 - 15.5 %   Platelets 216 150 - 400 K/uL    Comment: RESULT REPEATED AND VERIFIED SPECIMEN CHECKED FOR CLOTS CONSISTENT WITH PREVIOUS RESULT    Neutrophils Relative % 87 %   Lymphocytes Relative 8 %   Monocytes Relative 4 %   Eosinophils Relative 1 %   Basophils  Relative 0 %   Neutro Abs 16.8 (  H) 1.7 - 7.7 K/uL   Lymphs Abs 1.6 0.7 - 4.0 K/uL   Monocytes Absolute 0.8 0.1 - 1.0 K/uL   Eosinophils Absolute 0.2 0.0 - 0.7 K/uL   Basophils Absolute 0.0 0.0 - 0.1 K/uL   Smear Review PLATELET COUNT CONFIRMED BY SMEAR   Blood Culture (routine x 2)     Status: None (Preliminary result)   Collection Time: 11/04/14  3:45 PM  Result Value Ref Range   Specimen Description BLOOD RIGHT FOREARM    Special Requests BOTTLES DRAWN AEROBIC AND ANAEROBIC 5CC    Culture      NO GROWTH < 24 HOURS Performed at [facility]    Report Status PENDING   Brain natriuretic peptide     Status: Abnormal   Collection Time: 11/04/14  3:45 PM  Result Value Ref Range   B Natriuretic Peptide 618.3 (H) 0.0 - 100.0 pg/mL  Troponin I     Status: Abnormal   Collection Time: 11/04/14  3:45 PM  Result Value Ref Range   Troponin I 0.19 (H) <0.031 ng/mL    Comment:        PERSISTENTLY INCREASED TROPONIN VALUES IN THE RANGE OF 0.04-0.49 ng/mL CAN BE SEEN IN:       -UNSTABLE ANGINA       -CONGESTIVE HEART FAILURE       -MYOCARDITIS       -CHEST TRAUMA       -ARRYHTHMIAS       -LATE PRESENTING MYOCARDIAL INFARCTION       -COPD   CLINICAL FOLLOW-UP RECOMMENDED.   Blood Culture (routine x 2)     Status: None (Preliminary result)   Collection Time: 11/04/14  3:58 PM  Result Value Ref Range   Specimen Description BLOOD RIGHT ANTECUBITAL    Special Requests BOTTLES DRAWN AEROBIC AND ANAEROBIC 5CC    Culture      NO GROWTH < 24 HOURS Performed at [facility]    Report Status PENDING   I-Stat CG4 Lactic Acid, ED  (not at  [facility])     Status: None   Collection Time: 11/04/14  3:59 PM  Result Value Ref Range   Lactic Acid, Venous 1.92 0.5 - 2.0 mmol/L  Urinalysis, Routine w reflex microscopic (not at [facility])     Status: Abnormal   Collection Time: 11/04/14  4:13 PM  Result Value Ref Range   Color, Urine YELLOW YELLOW   APPearance CLEAR CLEAR   Specific Gravity,  Urine 1.011 1.005 - 1.030   pH 6.0 5.0 - 8.0   Glucose, UA NEGATIVE NEGATIVE mg/dL   Hgb urine dipstick SMALL (A) NEGATIVE   Bilirubin Urine NEGATIVE NEGATIVE   Ketones, ur NEGATIVE NEGATIVE mg/dL   Protein, ur 100 (A) NEGATIVE mg/dL   Urobilinogen, UA 0.2 0.0 - 1.0 mg/dL   Nitrite NEGATIVE NEGATIVE   Leukocytes, UA NEGATIVE NEGATIVE  Urine microscopic-add on     Status: None   Collection Time: 11/04/14  4:13 PM  Result Value Ref Range   Bacteria, UA RARE RARE  Urine culture     Status: None (Preliminary result)   Collection Time: 11/04/14  4:14 PM  Result Value Ref Range   Specimen Description URINE, CATHETERIZED    Special Requests NONE    Culture      CULTURE REINCUBATED FOR BETTER GROWTH Performed at [facility]    Report Status PENDING   Blood gas, arterial ([facility], [facility], [facility])     Status: Abnormal  Collection Time: 11/04/14  4:30 PM  Result Value Ref Range   FIO2 1.00    Delivery systems OXYGEN MASK    pH, Arterial 7.276 (L) 7.350 - 7.450   pCO2 arterial 38.0 35.0 - 45.0 mmHg   pO2, Arterial 211 (H) 80.0 - 100.0 mmHg   Bicarbonate 16.4 (L) 20.0 - 24.0 mEq/L   TCO2 15.2 0 - 100 mmol/L   Acid-base deficit 8.4 (H) 0.0 - 2.0 mmol/L   O2 Saturation 99.0 %   Patient temperature 104.0    Collection site LEFT RADIAL    Drawn by COLLECTED BY RT    Sample type ARTERIAL DRAW    Allens test (pass/fail) PASS PASS  I-Stat CG4 Lactic Acid, ED  (not at  [facility])     Status: None   Collection Time: 11/04/14  7:10 PM  Result Value Ref Range   Lactic Acid, Venous 1.08 0.5 - 2.0 mmol/L  MRSA PCR Screening     Status: None   Collection Time: 11/04/14  9:52 PM  Result Value Ref Range   MRSA by PCR NEGATIVE NEGATIVE    Comment:        The GeneXpert MRSA Assay (FDA approved for NASAL specimens only), is one component of a comprehensive MRSA colonization surveillance program. It is not intended to diagnose MRSA infection nor to guide or monitor treatment for MRSA infections.    Brain natriuretic peptide     Status: Abnormal   Collection Time: 11/05/14  8:04 AM  Result Value Ref Range   B Natriuretic Peptide 2900.3 (H) 0.0 - 100.0 pg/mL  Renal function panel     Status: Abnormal   Collection Time: 11/05/14  8:08 AM  Result Value Ref Range   Sodium 142 135 - 145 mmol/L   Potassium 3.8 3.5 - 5.1 mmol/L   Chloride 109 101 - 111 mmol/L   CO2 23 22 - 32 mmol/L   Glucose, Bld 125 (H) 65 - 99 mg/dL   BUN 37 (H) 6 - 20 mg/dL   Creatinine, Ser 2.51 (H) 0.44 - 1.00 mg/dL   Calcium 8.1 (L) 8.9 - 10.3 mg/dL   Phosphorus 4.9 (H) 2.5 - 4.6 mg/dL   Albumin 3.4 (L) 3.5 - 5.0 g/dL   GFR calc non Af Amer 16 (L) >60 mL/min   GFR calc Af Amer 19 (L) >60 mL/min    Comment: (NOTE) The eGFR has been calculated using the CKD EPI equation. This calculation has not been validated in all clinical situations. eGFR's persistently <60 mL/min signify possible Chronic Kidney Disease.    Anion gap 10 5 - 15  CBC with Differential/Platelet     Status: Abnormal   Collection Time: 11/05/14  8:08 AM  Result Value Ref Range   WBC 15.4 (H) 4.0 - 10.5 K/uL   RBC 3.82 (L) 3.87 - 5.11 MIL/uL   Hemoglobin 11.2 (L) 12.0 - 15.0 g/dL   HCT 35.4 (L) 36.0 - 46.0 %   MCV 92.7 78.0 - 100.0 fL   MCH 29.3 26.0 - 34.0 pg   MCHC 31.6 30.0 - 36.0 g/dL   RDW 14.8 11.5 - 15.5 %   Platelets  150 - 400 K/uL    PLATELET CLUMPS NOTED ON SMEAR, COUNT APPEARS ADEQUATE   Neutrophils Relative % 86 %   Neutro Abs 13.3 (H) 1.7 - 7.7 K/uL   Lymphocytes Relative 6 %   Lymphs Abs 0.9 0.7 - 4.0 K/uL   Monocytes Relative 8 %   Monocytes Absolute  1.2 (H) 0.1 - 1.0 K/uL   Eosinophils Relative 0 %   Eosinophils Absolute 0.1 0.0 - 0.7 K/uL   Basophils Relative 0 %   Basophils Absolute 0.0 0.0 - 0.1 K/uL  Magnesium     Status: Abnormal   Collection Time: 11/05/14  8:08 AM  Result Value Ref Range   Magnesium 1.6 (L) 1.7 - 2.4 mg/dL  Procalcitonin - Baseline     Status: None   Collection Time: 11/05/14  8:08 AM   Result Value Ref Range   Procalcitonin 54.37 ng/mL    Comment:        Interpretation: PCT >= 10 ng/mL: Important systemic inflammatory response, almost exclusively due to severe bacterial sepsis or septic shock. (NOTE)         ICU PCT Algorithm               Non ICU PCT Algorithm    ----------------------------     ------------------------------         PCT < 0.25 ng/mL                 PCT < 0.1 ng/mL     Stopping of antibiotics            Stopping of antibiotics       strongly encouraged.               strongly encouraged.    ----------------------------     ------------------------------       PCT level decrease by               PCT < 0.25 ng/mL       >= 80% from peak PCT       OR PCT 0.25 - 0.5 ng/mL          Stopping of antibiotics                                             encouraged.     Stopping of antibiotics           encouraged.    ----------------------------     ------------------------------       PCT level decrease by              PCT >= 0.25 ng/mL       < 80% from peak PCT        AND PCT >= 0.5 ng/mL             Continuing antibiotics                                              encouraged.       Continuing antibiotics            encouraged.    ----------------------------     ------------------------------     PCT level increase compared          PCT > 0.5 ng/mL         with peak PCT AND          PCT >= 0.5 ng/mL             Escalation of antibiotics  strongly encouraged.      Escalation of antibiotics        strongly encouraged.   Troponin I (q 6hr x 3)     Status: Abnormal   Collection Time: 11/05/14  8:08 AM  Result Value Ref Range   Troponin I 12.13 (HH) <0.031 ng/mL    Comment:        POSSIBLE MYOCARDIAL ISCHEMIA. SERIAL TESTING RECOMMENDED. DELTA CHECK NOTED CRITICAL RESULT CALLED TO, READ BACK BY AND VERIFIED WITH: J SMITH AT [id number] ON 09.17.2016 BY NBROOKS   Lactic acid, plasma     Status: Abnormal    Collection Time: 11/05/14  8:10 AM  Result Value Ref Range   Lactic Acid, Venous 2.1 (HH) 0.5 - 2.0 mmol/L    Comment: CRITICAL RESULT CALLED TO, READ BACK BY AND VERIFIED WITH: J SMITH AT [id number] ON 09.17.2016 BY NBROOKS   Troponin I (q 6hr x 3)     Status: Abnormal   Collection Time: 11/05/14  1:59 PM  Result Value Ref Range   Troponin I 10.28 (HH) <0.031 ng/mL    Comment:        POSSIBLE MYOCARDIAL ISCHEMIA. SERIAL TESTING RECOMMENDED. CRITICAL VALUE NOTED.  VALUE IS CONSISTENT WITH PREVIOUSLY REPORTED AND CALLED VALUE.    Dg Chest Port 1 View  11/04/2014   CLINICAL DATA:  Fever, cough.  EXAM: PORTABLE CHEST - 1 VIEW  COMPARISON:  August 16, 2014.  FINDINGS: Stable cardiomediastinal silhouette. Stable mild central pulmonary vascular congestion is noted. No pneumothorax or pleural effusion is noted. Degenerative changes seen involving both glenohumeral joints. Bilateral perihilar edema is noted.  IMPRESSION: Stable cardiomegaly. Mild central pulmonary vascular congestion is noted with probable bilateral perihilar edema.   Electronically Signed   By: Marijo Conception, M.D.   On: 11/04/2014 16:45    Review Of Systems No weight gain/loss, + GI bleed, No GU bleed, No kidney stone, ? Stroke, + osteoporosis, + senile dementia, + hard of hearing. No chest pain, No palpitations, No COPD or asthma, No hepatitis.  Blood pressure 142/76, pulse 67, temperature 99.3 F (37.4 C), temperature source Core (Comment), resp. rate 27, height _0  (1.651 m), weight 62.2 kg (137 lb 2 oz), SpO2 99 %. Physical Exam  Constitutional: She appears averagely developed and nourished. She appears distressed.  HENT: Normocephalic and atraumatic. Hard of hearing, Nose normal.  Mouth/Throat: Oropharynx is clear and moist and mucous membranes are normal.  Eyes: Conjunctivae and EOM are normal. Pupils are equal, round, and reactive to light.  Neck: Neck supple. + JVD. Cardiovascular: Regular rhythm, S1 normal and S2  normal. III/VI systolic murmur left sternal border. Pulmonary/Chest:  She has lower lobes crackles bilaterally. She exhibits no tenderness.  Abdominal: Soft. Non-tender. No hepatosplenomegaly.   Musculoskeletal: Normal range of motion.  Neurological: Hard of hearing. No cranial nerve deficit or sensory deficit. Coordination normal.   Skin: Skin is warm, dry and intact. No rash noted. No cyanosis.  Psychiatric: She appears anxious. Her speech is normal and behavior is normal. Thought content normal.  Nursing note and vitals reviewed.   Assessment/Plan Acute left heart systolic failure Possible STEMI Acute pulmonary edema Ischemic cardiomyopathy Mild MR and Moderate TR Moderate pulmonary hypertension Mild Aortic stenosis Hypertension Arthritis CKD, stage III Hyperlipidemia Glaucoma Senile osteoporosis  This high risk patient is not ready for cardiac interventions Continue diuresis and medical treatment. Daughter to bring DNR papers from [facility].  Birdie Riddle, MD  11/05/2014, 3:19 PM

## 2014-11-05 NOTE — Progress Notes (Signed)
PULMONARY / CRITICAL CARE MEDICINE HISTORY AND PHYSICAL EXAMINATION   Name: [name] MRN: [id number] DOB: 1925-04-22    ADMISSION DATE:  11/04/2014  PRIMARY SERVICE: PCCM  CHIEF COMPLAINT:  dyspnea  BRIEF PATIENT DESCRIPTION: 81 y/o woman with hx of CKD diastolic dysfunction who presents with fever, dyspnea, and hypertension.  SIGNIFICANT EVENTS:  9/16 - Admitted to Hospital. BiPAP in ED 9/17 - Seen in ICU off BiPAP  STUDIES: Portable CXR 9/16 - Pulmonary edema/CHF w/ questionable cardiomegaly. TTE 9/17 - Pending  SUBJECTIVE: No chest pain or pressure. Weaned off of BiPAP & tolerating nasal cannula. Denies any dyspnea or cough at this time.  ROS:  Patient denies any fever, chills, or sweats. Denies any nausea or abdominal pain.  VITAL SIGNS: Temp:  [99.1 F (37.3 C)-104.3 F (40.2 C)] 99.5 F (37.5 C) (09/17 0900) Pulse Rate:  [71-138] 73 (09/17 0900) Resp:  [12-48] 26 (09/17 0900) BP: (113-241)/(72-138) 151/73 mmHg (09/17 0900) SpO2:  [87 %-100 %] 98 % (09/17 0900) FiO2 (%):  [40 %] 40 % (09/17 0007) Weight:  [62.2 kg (137 lb 2 oz)-63.4 kg (139 lb 12.4 oz)] 62.2 kg (137 lb 2 oz) (09/17 0500) HEMODYNAMICS:   VENTILATOR SETTINGS: Vent Mode:  [-] BIPAP FiO2 (%):  [40 %] 40 % Set Rate:  [12 bmp] 12 bmp PEEP:  [6 cmH20] 6 cmH20 INTAKE / OUTPUT: Intake/Output      09/16 0701 - 09/17 0700 09/17 0701 - 09/18 0700   I.V. (mL/kg) 2092.4 (33.6)    IV Piggyback 350    Total Intake(mL/kg) 2442.4 (39.3)    Urine (mL/kg/hr) 2000    Total Output 2000     Net +442.4            PHYSICAL EXAMINATION: General:  Awake. Alert. Daughter bedside. Patient currently undergoing echocardiogram at bedside.  Integument:  Warm & dry. No rash on exposed skin. HEENT:  Moist mucus membranes. No oral ulcers. No scleral injection or icterus.  Cardiovascular:  Regular rate. Bilateral lower extubated edema.  Pulmonary:  Decreased aeration bilateral lung bases. Symmetric chest wall rise.  Normal work of breathing on nasal cannula oxygen. Abdomen: Soft. Normal bowel sounds. Nondistended. Grossly nontender. Neurological:  CN 2-12 grossly in tact. No meningismus. Moving all 4 extremities equally.   LABS:  CBC  Recent Labs Lab 11/04/14 1545 11/05/14 0808  WBC 19.4* 15.4*  HGB 12.3 11.2*  HCT 37.9 35.4*  PLT 216 PLATELET CLUMPS NOTED ON SMEAR, COUNT APPEARS ADEQUATE   Coag's No results for input(s): APTT, INR in the last 168 hours. BMET  Recent Labs Lab 11/04/14 1545 11/05/14 0808  NA 140 142  K 4.0 3.8  CL 109 109  CO2 20* 23  BUN 37* 37*  CREATININE 2.30* 2.51*  GLUCOSE 145* 125*   Electrolytes  Recent Labs Lab 11/04/14 1545 11/05/14 0808  CALCIUM 8.9 8.1*  MG  --  1.6*  PHOS  --  4.9*   Sepsis Markers  Recent Labs Lab 11/04/14 1559 11/04/14 1910 11/05/14 0810  LATICACIDVEN 1.92 1.08 2.1*   ABG  Recent Labs Lab 11/04/14 1630  PHART 7.276*  PCO2ART 38.0  PO2ART 211*   Liver Enzymes  Recent Labs Lab 11/04/14 1545 11/05/14 0808  AST 21  --   ALT 13*  --   ALKPHOS 55  --   BILITOT 0.7  --   ALBUMIN 4.4 3.4*   Cardiac Enzymes  Recent Labs Lab 11/04/14 1545 11/05/14 0808  TROPONINI 0.19* 12.13*  Glucose No results for input(s): GLUCAP in the last 168 hours.  Imaging Dg Chest Port 1 View  11/04/2014   CLINICAL DATA:  Fever, cough.  EXAM: PORTABLE CHEST - 1 VIEW  COMPARISON:  August 16, 2014.  FINDINGS: Stable cardiomediastinal silhouette. Stable mild central pulmonary vascular congestion is noted. No pneumothorax or pleural effusion is noted. Degenerative changes seen involving both glenohumeral joints. Bilateral perihilar edema is noted.  IMPRESSION: Stable cardiomegaly. Mild central pulmonary vascular congestion is noted with probable bilateral perihilar edema.   Electronically Signed   By: Marijo Conception, M.D.   On: 11/04/2014 16:45    ASSESSMENT / PLAN:  PULMONARY A: Flash Pulmonary Edema - Improving. Possible  Pneumonia  P:   BiPAP prn for increased work of breathing Wean FiO2 for Sat >92% Holding on further diuresis at this time  CARDIOVASCULAR A: Elevated Troponin I Hypertensive Emergency - Improving after diuresis  P:   Consult called to Cardiology TTE pending Trending Troponin I Continue Coreg, Lipitor, & ASA Off Nitroglycerin drip Checking lipid panel with a.m. labs  RENAL A: Lactic Acidosis Hypomagnesemia - Replacing IV CKD   P:   Trending LA Monitor UOP w/ foley catheter Trending renal function w/ daily BUN/Creatinine Magnesium Sulfate 2gm IV Repeat electrolytes & Magnesium in AM 9/18  GASTROINTESTINAL A: No active issues. H/O GERD  P:   NPO for now Protonix PO daily  HEMATOLOGIC A: Leukocytosis - Improving.  P:   Trending Luekocytosis Heparin Ross q8hr SCDs  INFECTIOUS A: Sepsis/SIRS  P:   Monitoring Fever Procalcitonin Algorithm  Urine Ctx 9/16>> Blood Ctx 9/16>> Respiratory Virus Panel 9/16>>  Vanc 9/16>> Zosyn 9/16>>  ENDOCRINE A: Hyperglycemia  P:   Accu-Cheks every 4 hours Low-dose sliding scale algorithm Checking hemoglobin A1c  NEUROLOGIC A: Mild delirium - improving.  P:   Limit sedating medications.  FAMILY: Daughter updated at bedside by myself.  TODAY'S SUMMARY: 81 y/o woman with cardiac dz who presents with sepsis of pulmonary origina nd volume overload. Patient has elevated troponin I and questionable EKG changes on review of her EKG this morning. I have contacted cardiology for their assessment and transthoracic echocardiogram is pending. Patient will remain in the intensive care unit today for close monitoring.  I have spent a total of 36 minutes of critical care time today caring for the patient, contacting cardiology for consultation, updating the patient's daughter at bedside, & reviewing the patient's electronic medical record.  Sonia Baller Ashok Cordia, M.D. [facility] Pulmonary & Critical Care Pager:   [phone] After 3pm or if no response, call [phone] 11/05/2014, 9:51 AM

## 2014-11-05 NOTE — Progress Notes (Signed)
Pt removed from BIPAP and placed on 2 LPM [place].  Pt tolerating well at this time, pt in no respiratory distress and has no increase WOB.  RT to monitor and assess as needed.

## 2014-11-05 NOTE — Progress Notes (Signed)
MD contacted of critical lactic acid of 2.1 and of troponin of 12.13.

## 2014-11-06 DIAGNOSIS — I214 Non-ST elevation (NSTEMI) myocardial infarction: Secondary | ICD-10-CM

## 2014-11-06 DIAGNOSIS — I5021 Acute systolic (congestive) heart failure: Principal | ICD-10-CM

## 2014-11-06 LAB — LIPID PANEL
Cholesterol: 109 mg/dL (ref 0–200)
HDL: 35 mg/dL — ABNORMAL LOW (ref >40)
LDL CALC: 49 mg/dL (ref 0–99)
TRIGLYCERIDES: 124 mg/dL (ref <150)
Total CHOL/HDL Ratio: 3.1 RATIO
VLDL: 25 mg/dL (ref 0–40)

## 2014-11-06 LAB — GLUCOSE, CAPILLARY
GLUCOSE-CAPILLARY: 92 mg/dL (ref 65–99)
GLUCOSE-CAPILLARY: 83 mg/dL (ref 65–99)
GLUCOSE-CAPILLARY: 93 mg/dL (ref 65–99)
Glucose-Capillary: 96 mg/dL (ref 65–99)
Glucose-Capillary: 93 mg/dL (ref 65–99)

## 2014-11-06 LAB — RENAL FUNCTION PANEL
Albumin: 3.1 g/dL — ABNORMAL LOW (ref 3.5–5.0)
Anion gap: 9 (ref 5–15)
BUN: 37 mg/dL — ABNORMAL HIGH (ref 6–20)
CALCIUM: 8.2 mg/dL — AB (ref 8.9–10.3)
CHLORIDE: 109 mmol/L (ref 101–111)
CO2: 24 mmol/L (ref 22–32)
CREATININE: 2.62 mg/dL — AB (ref 0.44–1.00)
GFR, EST AFRICAN AMERICAN: 18 mL/min — AB (ref >60)
GFR, EST NON AFRICAN AMERICAN: 15 mL/min — AB (ref >60)
Glucose, Bld: 106 mg/dL — ABNORMAL HIGH (ref 65–99)
Phosphorus: 4.3 mg/dL (ref 2.5–4.6)
Potassium: 3.4 mmol/L — ABNORMAL LOW (ref 3.5–5.1)
SODIUM: 142 mmol/L (ref 135–145)

## 2014-11-06 LAB — CBC WITH DIFFERENTIAL/PLATELET
BASOS ABS: 0 10*3/uL (ref 0.0–0.1)
BASOS PCT: 0 %
EOS ABS: 0.2 10*3/uL (ref 0.0–0.7)
EOS PCT: 2 %
HEMATOCRIT: 32.6 % — AB (ref 36.0–46.0)
Hemoglobin: 10.5 g/dL — ABNORMAL LOW (ref 12.0–15.0)
Lymphocytes Relative: 13 %
Lymphs Abs: 1.1 10*3/uL (ref 0.7–4.0)
MCH: 29.8 pg (ref 26.0–34.0)
MCHC: 32.2 g/dL (ref 30.0–36.0)
MCV: 92.6 fL (ref 78.0–100.0)
MONO ABS: 1.3 10*3/uL — AB (ref 0.1–1.0)
MONOS PCT: 16 %
NEUTROS ABS: 5.8 10*3/uL (ref 1.7–7.7)
Neutrophils Relative %: 69 %
PLATELETS: 169 10*3/uL (ref 150–400)
RBC: 3.52 MIL/uL — ABNORMAL LOW (ref 3.87–5.11)
RDW: 14.8 % (ref 11.5–15.5)
WBC: 8.4 10*3/uL (ref 4.0–10.5)

## 2014-11-06 LAB — PROCALCITONIN: PROCALCITONIN: 54.22 ng/mL

## 2014-11-06 LAB — MAGNESIUM: Magnesium: 2.3 mg/dL (ref 1.7–2.4)

## 2014-11-06 MED ORDER — INSULIN ASPART 100 UNIT/ML ~~LOC~~ SOLN: 0.0000 [IU] | Freq: Three times a day (TID) | SUBCUTANEOUS | Status: DC

## 2014-11-06 MED ORDER — CETYLPYRIDINIUM CHLORIDE 0.05 % MT LIQD
7.0000 mL | Freq: Two times a day (BID) | OROMUCOSAL | Status: DC
  Administered 2014-11-08 – 2014-11-10 (×6): 7 mL via OROMUCOSAL

## 2014-11-06 MED ORDER — CHLORHEXIDINE GLUCONATE 0.12 % MT SOLN
15.0000 mL | Freq: Two times a day (BID) | OROMUCOSAL | Status: DC
  Administered 2014-11-06 – 2014-11-10 (×7): 15 mL via OROMUCOSAL
  Filled 2014-11-06 (×10): qty 15

## 2014-11-06 MED ORDER — INSULIN ASPART 100 UNIT/ML ~~LOC~~ SOLN: 0.0000 [IU] | Freq: Every day | SUBCUTANEOUS | Status: DC

## 2014-11-06 MED ORDER — ASPIRIN EC 81 MG PO TBEC
162.0000 mg | DELAYED_RELEASE_TABLET | Freq: Every day | ORAL | Status: DC
  Administered 2014-11-06 – 2014-11-10 (×5): 162 mg via ORAL
  Filled 2014-11-06 (×5): qty 2

## 2014-11-06 MED ORDER — HYDRALAZINE HCL 20 MG/ML IJ SOLN
10.0000 mg | INTRAMUSCULAR | Status: DC | PRN
  Administered 2014-11-06 – 2014-11-08 (×2): 10 mg via INTRAVENOUS
  Filled 2014-11-06 (×2): qty 1

## 2014-11-06 MED ORDER — CARVEDILOL 12.5 MG PO TABS
12.5000 mg | ORAL_TABLET | Freq: Two times a day (BID) | ORAL | Status: DC
  Administered 2014-11-06 – 2014-11-10 (×9): 12.5 mg via ORAL
  Filled 2014-11-06 (×10): qty 1

## 2014-11-06 MED ORDER — POTASSIUM CHLORIDE CRYS ER 20 MEQ PO TBCR
40.0000 meq | EXTENDED_RELEASE_TABLET | Freq: Once | ORAL | Status: AC
  Administered 2014-11-06: 40 meq via ORAL
  Filled 2014-11-06: qty 2

## 2014-11-06 MED ORDER — LABETALOL HCL 5 MG/ML IV SOLN
10.0000 mg | INTRAVENOUS | Status: DC | PRN
  Administered 2014-11-08: 10 mg via INTRAVENOUS
  Filled 2014-11-06 (×2): qty 4

## 2014-11-06 MED ORDER — CLONIDINE HCL 0.1 MG PO TABS
0.1000 mg | ORAL_TABLET | Freq: Three times a day (TID) | ORAL | Status: DC
  Administered 2014-11-06 – 2014-11-10 (×13): 0.1 mg via ORAL
  Filled 2014-11-06 (×14): qty 1

## 2014-11-06 NOTE — Progress Notes (Signed)
PULMONARY / CRITICAL CARE MEDICINE HISTORY AND PHYSICAL EXAMINATION   Name: [name] MRN: [id number] DOB: [birth year]    ADMISSION DATE:  11/04/2014  PRIMARY SERVICE: PCCM  CHIEF COMPLAINT:  dyspnea  BRIEF PATIENT DESCRIPTION: 81 y/o woman with hx of CKD diastolic dysfunction who presents with fever, dyspnea, and hypertension.  SIGNIFICANT EVENTS:  9/16 - Admitted to Hospital. BiPAP in ED 9/17 - Seen in ICU off BiPAP  STUDIES: Portable CXR 9/16 - Pulmonary edema/CHF w/ questionable cardiomegaly. TTE 9/17 - EF 35-40% w/ severe hypokinesis of the entire anteroseptal myocardium consistent with ischemia. Grade 1 diastolic dysfunction. Mild aortic stenosis.  SUBJECTIVE: Continues to be without chest pain or pressure. No dyspnea or cough. Has remained off of BiPAP overnight.  ROS:  Denies any fever, chills or sweats. Denies any nausea or vomiting. Tolerating diet.  VITAL SIGNS: Temp:  [97.3 F (36.3 C)-99.7 F (37.6 C)] 98.2 F (36.8 C) (09/18 0800) Pulse Rate:  [58-83] 64 (09/18 0800) Resp:  [11-30] 18 (09/18 0800) BP: (108-189)/(46-102) 189/99 mmHg (09/18 0800) SpO2:  [96 %-100 %] 99 % (09/18 0800) Weight:  [61.3 kg (135 lb 2.3 oz)] 61.3 kg (135 lb 2.3 oz) (09/18 0449) HEMODYNAMICS:   VENTILATOR SETTINGS:   INTAKE / OUTPUT: Intake/Output      09/17 0701 - 09/18 0700 09/18 0701 - 09/19 0700   I.V. (mL/kg)     IV Piggyback 250    Total Intake(mL/kg) 250 (4.1)    Urine (mL/kg/hr) 1525 (1)    Stool 0 (0)    Total Output 1525     Net -1275          Stool Occurrence 2 x 1 x     PHYSICAL EXAMINATION: General:  Awake. Alert. No distress.  Integument:  Warm & dry. No rash on exposed skin. HEENT:  Moist mucus membranes. No oral ulcers. No scleral injection.  Cardiovascular:  Regular rate. Bilateral lower extubated edema persists.  Pulmonary:  Decreased aeration bilateral lung bases. Normal work of breathing on nasal cannula oxygen. Abdomen: Soft. Normal bowel sounds.  Grossly nontender. Neurological:  CN 2-12 grossly in tact. Moving all 4 extremities equally.   LABS:  CBC  Recent Labs Lab 11/04/14 1545 11/05/14 0808 11/06/14 0340  WBC 19.4* 15.4* 8.4  HGB 12.3 11.2* 10.5*  HCT 37.9 35.4* 32.6*  PLT 216 PLATELET CLUMPS NOTED ON SMEAR, COUNT APPEARS ADEQUATE 169   Coag's No results for input(s): APTT, INR in the last 168 hours. BMET  Recent Labs Lab 11/04/14 1545 11/05/14 0808 11/06/14 0340  NA 140 142 142  K 4.0 3.8 3.4*  CL 109 109 109  CO2 20* 23 24  BUN 37* 37* 37*  CREATININE 2.30* 2.51* 2.62*  GLUCOSE 145* 125* 106*   Electrolytes  Recent Labs Lab 11/04/14 1545 11/05/14 0808 11/06/14 0340  CALCIUM 8.9 8.1* 8.2*  MG  --  1.6* 2.3  PHOS  --  4.9* 4.3   Sepsis Markers  Recent Labs Lab 11/04/14 1559 11/04/14 1910 11/05/14 0808 11/05/14 0810 11/06/14 0340  LATICACIDVEN 1.92 1.08  --  2.1*  --   PROCALCITON  --   --  54.37  --  54.22   ABG  Recent Labs Lab 11/04/14 1630  PHART 7.276*  PCO2ART 38.0  PO2ART 211*   Liver Enzymes  Recent Labs Lab 11/04/14 1545 11/05/14 0808 11/06/14 0340  AST 21  --   --   ALT 13*  --   --   ALKPHOS 55  --   --  BILITOT 0.7  --   --   ALBUMIN 4.4 3.4* 3.1*   Cardiac Enzymes  Recent Labs Lab 11/05/14 0808 11/05/14 1359 11/05/14 2036  TROPONINI 12.13* 10.28* 9.97*   Glucose  Recent Labs Lab 11/05/14 1256 11/05/14 1801 11/05/14 2010 11/05/14 2334 11/06/14 0355 11/06/14 0737  GLUCAP 95 116* 94 101* 96 92    Imaging Dg Chest Port 1 View  11/04/2014   CLINICAL DATA:  Fever, cough.  EXAM: PORTABLE CHEST - 1 VIEW  COMPARISON:  August 16, 2014.  FINDINGS: Stable cardiomediastinal silhouette. Stable mild central pulmonary vascular congestion is noted. No pneumothorax or pleural effusion is noted. Degenerative changes seen involving both glenohumeral joints. Bilateral perihilar edema is noted.  IMPRESSION: Stable cardiomegaly. Mild central pulmonary vascular  congestion is noted with probable bilateral perihilar edema.   Electronically Signed   By: Marijo Conception, M.D.   On: 11/04/2014 16:45    ASSESSMENT / PLAN:  PULMONARY A: Flash Pulmonary Edema - Improving. Off BiPAP. Possible Pneumonia  P:   Wean FiO2 for Sat >92% Holding on further diuresis at this time  CARDIOVASCULAR A: Acute Systolic CHF NSTEMI Mild Aortic Stenosis Hypertensive Emergency - Improving. Increasing oral meds.  P:   Cardiology Consulted - recommended med mgt Increase Coreg to 12.5mg  bid Increase Clonidine to 0.1mg  po tid Continue Lipitor 20mg  po qhs - LDL 49  RENAL A: Lactic Acidosis Hypokalemia - Replacing PO. Hypomagnesemia - Resolved. CKD   P:   Trending LA daily Monitor UOP w/ foley catheter Trending renal function w/ daily BUN/Creatinine Repeat electrolytes & Magnesium in AM 9/19  GASTROINTESTINAL A: No active issues. H/O GERD  P:   Heart Healthy Diet Protonix PO daily  HEMATOLOGIC A: Leukocytosis - Resolved. Mild Anemia - No active bleeding.  P:   Monitor Hgb daily w/ CBC Heparin [place] q8hr SCDs  INFECTIOUS A: Sepsis/SIRS  P:   D/C Vancomycin today Monitoring Fever Procalcitonin Algorithm  Urine Ctx 9/16>> Blood Ctx 9/16>> Respiratory Virus Panel 9/16>>  Vanc 9/16>>9/18 Zosyn 9/16>>  ENDOCRINE A: Hyperglycemia  P:   Accu-Cheks qAC & HS Low-dose sliding scale algorithm Hemoglobin A1c pending  NEUROLOGIC A: Mild delirium - improving.  P:   Limit sedating medications.  FAMILY: No family at bedside today.  TODAY'S SUMMARY: 81 y/o woman with cardiac dz who presents with sepsis of pulmonary origina nd volume overload. Patient admitted with acute myocardial infarction and acute systolic congestive heart failure. Continuing to increase oral medications for blood pressure control. Cardiology has consulted and recommended continuing medical management. Patient's respiratory status has remained stable. Transitioning  to [place] off 9/19. Nursing home advanced directives do not clearly state a DO NOT RESUSCITATE status and this should be clarified further with her daughter what she arrives today.   Sonia Baller Ashok Cordia, M.D. [facility] Pulmonary & Critical Care Pager:  [phone] After 3pm or if no response, call [phone] 11/06/2014, 10:54 AM

## 2014-11-06 NOTE — Progress Notes (Signed)
RN noted a change in patient's EKG rhythm. There was increased ST depression; A 12 lead EKG was obtained. E-link was alerted. Will continue to monitor.

## 2014-11-06 NOTE — Progress Notes (Signed)
eLink Physician-Brief Progress Note Patient Name: [name] DOB: [birth year] MRN: [id number]   Date of Service  11/06/2014  HPI/Events of Note  Hypokalemia  eICU Interventions  Potassium replaced     Intervention Category Minor Interventions: Electrolytes abnormality - evaluation and management  DETERDING,[name] 11/06/2014, 5:51 AM

## 2014-11-06 NOTE — Progress Notes (Signed)
No new orders received; Will continue to monitor and report.

## 2014-11-06 NOTE — Progress Notes (Signed)
Ref: GREEN, [name], MD   Subjective:  No chest pain. Improved breathing. Troponin-I leveling off.  Objective:  Vital Signs in the last 24 hours: Temp:  [97.3 F (36.3 C)-99.5 F (37.5 C)] 99 F (37.2 C) (09/18 1100) Pulse Rate:  [58-83] 63 (09/18 1100) Cardiac Rhythm:  [-] Normal sinus rhythm (09/18 1000) Resp:  [11-30] 20 (09/18 1100) BP: (108-189)/(46-99) 139/58 mmHg (09/18 1100) SpO2:  [91 %-100 %] 91 % (09/18 1100) Weight:  [61.3 kg (135 lb 2.3 oz)] 61.3 kg (135 lb 2.3 oz) (09/18 0449)  Physical Exam: BP Readings from Last 1 Encounters:  11/06/14 139/58    Wt Readings from Last 1 Encounters:  11/06/14 61.3 kg (135 lb 2.3 oz)    Weight change: -2.1 kg (-4 lb 10.1 oz)  HEENT: [place]/AT, Eyes- PERL, EOMI, Conjunctiva-Pink, Sclera-Non-icteric Neck: No JVD, No bruit, Trachea midline. Lungs:  Clearing, Bilateral. Cardiac:  Regular rhythm, normal S1 and S2, no S3. 2 + femoral pulses. Abdomen:  Soft, non-tender. Extremities:  No edema present. No cyanosis. No clubbing. CNS: AxOx2, Cranial nerves grossly intact, moves all 4 extremities.  Skin: Warm and dry.   Intake/Output from previous day: 09/17 0701 - 09/18 0700 In: 250 [IV Piggyback:250] Out: [id number] [Urine:1525]    Lab Results: BMET    Component Value Date/Time   NA 142 11/06/2014 0340   NA 142 11/05/2014 0808   NA 140 11/04/2014 1545   NA 144 10/19/2014 0824   NA 141 10/06/2014   NA 143 09/06/2014   NA 143 08/30/2014   K 3.4* 11/06/2014 0340   K 3.8 11/05/2014 0808   K 4.0 11/04/2014 1545   K 3.9 10/19/2014 0824   CL 109 11/06/2014 0340   CL 109 11/05/2014 0808   CL 109 11/04/2014 1545   CO2 24 11/06/2014 0340   CO2 23 11/05/2014 0808   CO2 20* 11/04/2014 1545   CO2 23 10/19/2014 0824   GLUCOSE 106* 11/06/2014 0340   GLUCOSE 125* 11/05/2014 0808   GLUCOSE 145* 11/04/2014 1545   GLUCOSE 124 10/19/2014 0824   BUN 37* 11/06/2014 0340   BUN 37* 11/05/2014 0808   BUN 37* 11/04/2014 1545   BUN 33.2*  10/19/2014 0824   BUN 26* 10/06/2014   BUN 29* 09/06/2014   BUN 33* 08/30/2014   CREATININE 2.62* 11/06/2014 0340   CREATININE 2.51* 11/05/2014 0808   CREATININE 2.30* 11/04/2014 1545   CREATININE 2.7* 10/19/2014 0824   CREATININE 2.4* 10/06/2014   CREATININE 2.8* 09/06/2014   CREATININE 3.0* 08/30/2014   CALCIUM 8.2* 11/06/2014 0340   CALCIUM 8.1* 11/05/2014 0808   CALCIUM 8.9 11/04/2014 1545   CALCIUM 9.2 10/19/2014 0824   GFRNONAA 15* 11/06/2014 0340   GFRNONAA 16* 11/05/2014 0808   GFRNONAA 18* 11/04/2014 1545   GFRAA 18* 11/06/2014 0340   GFRAA 19* 11/05/2014 0808   GFRAA 21* 11/04/2014 1545   CBC    Component Value Date/Time   WBC 8.4 11/06/2014 0340   WBC 5.2 10/19/2014 0824   WBC 4.9 10/06/2014   RBC 3.52* 11/06/2014 0340   RBC 3.53* 10/19/2014 0824   HGB 10.5* 11/06/2014 0340   HGB 10.7* 10/19/2014 0824   HCT 32.6* 11/06/2014 0340   HCT 32.4* 10/19/2014 0824   HCT 27.6* 08/16/2014 2217   PLT 169 11/06/2014 0340   PLT 195 10/19/2014 0824   MCV 92.6 11/06/2014 0340   MCV 91.7 10/19/2014 0824   MCH 29.8 11/06/2014 0340   MCH 30.2 10/19/2014 [id number]  MCHC 32.2 11/06/2014 0340   MCHC 32.9 10/19/2014 0824   RDW 14.8 11/06/2014 0340   RDW 14.7* 10/19/2014 0824   LYMPHSABS 1.1 11/06/2014 0340   LYMPHSABS 1.2 10/19/2014 0824   MONOABS 1.3* 11/06/2014 0340   MONOABS 0.6 10/19/2014 0824   EOSABS 0.2 11/06/2014 0340   EOSABS 0.1 10/19/2014 0824   BASOSABS 0.0 11/06/2014 0340   BASOSABS 0.0 10/19/2014 0824   HEPATIC Function Panel  Recent Labs  08/17/14 0410 10/19/14 0824 11/04/14 1545  PROT 5.9* 6.3* 7.5   HEMOGLOBIN A1C No components found for: HGA1C,  MPG CARDIAC ENZYMES Lab Results  Component Value Date   TROPONINI 9.97* 11/05/2014   TROPONINI 10.28* 11/05/2014   TROPONINI 12.13* 11/05/2014   BNP No results for input(s): PROBNP in the last 8760 hours. TSH  Recent Labs  02/03/14 08/16/14 08/16/14 2217  TSH 2.29 2.78 2.227    CHOLESTEROL  Recent Labs  03/15/14 08/16/14 11/06/14 0340  CHOL 190 89 109    Scheduled Meds: . antiseptic oral rinse  7 mL Mouth Rinse q12n4p  . aspirin EC  162 mg Oral Daily  . atorvastatin  20 mg Oral q1800  . carvedilol  12.5 mg Oral BID WC  . chlorhexidine  15 mL Mouth Rinse BID  . cloNIDine  0.1 mg Oral TID  . heparin  5,000 Units Subcutaneous 3 times per day  . insulin aspart  0-5 Units Subcutaneous QHS  . insulin aspart  0-9 Units Subcutaneous TID WC  . latanoprost  1 drop Both Eyes QHS  . oxybutynin  5 mg Oral QHS  . pantoprazole  40 mg Oral Daily  . piperacillin-tazobactam (ZOSYN)  IV  2.25 g Intravenous Q6H  . polyvinyl alcohol  2 drop Both Eyes BID  . prednisoLONE acetate  1 drop Right Eye QID  . timolol  1 drop Both Eyes Daily   Continuous Infusions:  PRN Meds:.sodium chloride, alum & mag hydroxide-simeth, hydrALAZINE, labetalol  Assessment/Plan: Acute left heart systolic failure-improving STEMI-anterior wall Acute pulmonary edema-resolving Ischemic cardiomyopathy Left breast cancer Mild MR and Moderate TR Moderate pulmonary hypertension Mild Aortic stenosis Hypertension Arthritis CKD, stage III Hyperlipidemia Glaucoma Senile osteoporosis  Patient and daughter wants to go for cardiac cath only for now and discuss findings for possible treatment or no treatment. Understood risks, benefits and alternatives. Will transfer patient to [facility] with cardiac catheterization on Tuesday. Continue antibiotics. CCM and hospitalist are aware of transfer     LOS: 2 days    [name]  MD  11/06/2014, 12:34 PM

## 2014-11-07 LAB — RESPIRATORY VIRUS PANEL
ADENOVIRUS: NEGATIVE
INFLUENZA A: NEGATIVE
INFLUENZA B 1: NEGATIVE
METAPNEUMOVIRUS: NEGATIVE
PARAINFLUENZA 1 A: NEGATIVE
PARAINFLUENZA 3 A: NEGATIVE
Parainfluenza 2: NEGATIVE
Respiratory Syncytial Virus A: NEGATIVE
Respiratory Syncytial Virus B: NEGATIVE
Rhinovirus: NEGATIVE

## 2014-11-07 LAB — URINE CULTURE: Culture: >=100000

## 2014-11-07 LAB — GLUCOSE, CAPILLARY
GLUCOSE-CAPILLARY: 130 mg/dL — AB (ref 65–99)
GLUCOSE-CAPILLARY: 90 mg/dL (ref 65–99)
GLUCOSE-CAPILLARY: 92 mg/dL (ref 65–99)
GLUCOSE-CAPILLARY: 98 mg/dL (ref 65–99)
Glucose-Capillary: 151 mg/dL — ABNORMAL HIGH (ref 65–99)

## 2014-11-07 LAB — HEMOGLOBIN A1C
Hgb A1c MFr Bld: 5.6 % (ref 4.8–5.6)
Mean Plasma Glucose: 114 mg/dL

## 2014-11-07 MED ORDER — SODIUM CHLORIDE 0.9 % IJ SOLN: 3.0000 mL | INTRAMUSCULAR | Status: DC | PRN

## 2014-11-07 MED ORDER — SODIUM CHLORIDE 0.9 % IJ SOLN
3.0000 mL | Freq: Two times a day (BID) | INTRAMUSCULAR | Status: DC
  Administered 2014-11-07 – 2014-11-08 (×3): 3 mL via INTRAVENOUS

## 2014-11-07 MED ORDER — SODIUM CHLORIDE 0.9 % IV SOLN
INTRAVENOUS | Status: DC
  Administered 2014-11-07: 10 mL/h via INTRAVENOUS
  Administered 2014-11-07: 17:00:00 via INTRAVENOUS

## 2014-11-07 MED ORDER — SODIUM CHLORIDE 0.9 % IV SOLN: 250.0000 mL | INTRAVENOUS | Status: DC | PRN

## 2014-11-07 NOTE — Clinical Social Work Placement (Signed)
   CLINICAL SOCIAL WORK PLACEMENT  NOTE  Date:  11/07/2014  Patient Details  Name: [name] MRN: [id number] Date of Birth: 31-Jan-1926  Clinical Social Work is seeking post-discharge placement for this patient at the [place] level of care (*CSW will initial, date and re-position this form in  chart as items are completed):  Yes   Patient/family provided with [place] Work Department's list of facilities offering this level of care within the geographic area requested by the patient (or if unable, by the patient's family).  Yes   Patient/family informed of their freedom to choose among providers that offer the needed level of care, that participate in Medicare, Medicaid or managed care program needed by the patient, have an available bed and are willing to accept the patient.  Yes   Patient/family informed of [facility]'s ownership interest in [facility] and [facility], as well as of the fact that they are under no obligation to receive care at these facilities.  PASRR submitted to EDS on       PASRR number received on       Existing PASRR number confirmed on 11/07/14     FL2 transmitted to all facilities in geographic area requested by pt/family on 11/07/14     FL2 transmitted to all facilities within larger geographic area on       Patient informed that his/her managed care company has contracts with or will negotiate with certain facilities, including the following:        Yes   Patient/family informed of bed offers received.  Patient [facility] bed at [facility]     Physician recommends and patient chooses bed at      Patient to be transferred to   on  .  Patient to be transferred to facility by       Patient family notified on   of transfer.  Name of family member notified:        PHYSICIAN       Additional Comment:    _______________________________________________ Greta Doom, LCSW 11/07/2014, 2:15  PM

## 2014-11-07 NOTE — Progress Notes (Signed)
Ref: GREEN, [name], MD   Subjective:  No chest pain. Mild respiratory distress at times.  Objective:  Vital Signs in the last 24 hours: Temp:  [97.5 F (36.4 C)-98.4 F (36.9 C)] 97.5 F (36.4 C) (09/19 1527) Pulse Rate:  [61-70] 67 (09/19 1708) Cardiac Rhythm:  [-] Normal sinus rhythm (09/19 1200) Resp:  [17-26] 23 (09/19 1200) BP: (129-180)/(60-97) 153/68 mmHg (09/19 1708) SpO2:  [98 %-100 %] 100 % (09/19 1200) Weight:  [60.5 kg (133 lb 6.1 oz)-62.1 kg (136 lb 14.5 oz)] 62.1 kg (136 lb 14.5 oz) (09/19 1632)  Physical Exam: BP Readings from Last 1 Encounters:  11/07/14 153/68    Wt Readings from Last 1 Encounters:  11/07/14 62.1 kg (136 lb 14.5 oz)    Weight change: -0.8 kg (-1 lb 12.2 oz)  HEENT: [place]/AT, Eyes-PERL, EOMI, Conjunctiva-Pink, Sclera-Non-icteric Neck: No JVD, No bruit, Trachea midline. Lungs:  Clearing with few basal crackles, Bilateral. Cardiac:  Regular rhythm, normal S1 and S2, no S3.  Abdomen:  Soft, non-tender. Extremities:  No edema present. No cyanosis. No clubbing. CNS: AxOx2, Cranial nerves grossly intact, moves all 4 extremities. Right handed. Skin: Warm and dry.   Intake/Output from previous day: 09/18 0701 - 09/19 0700 In: 210 [I.V.:10; IV Piggyback:200] Out: 2025 [Urine:2025]    Lab Results: BMET    Component Value Date/Time   NA 142 11/06/2014 0340   NA 142 11/05/2014 0808   NA 140 11/04/2014 1545   NA 144 10/19/2014 0824   NA 141 10/06/2014   NA 143 09/06/2014   NA 143 08/30/2014   K 3.4* 11/06/2014 0340   K 3.8 11/05/2014 0808   K 4.0 11/04/2014 1545   K 3.9 10/19/2014 0824   CL 109 11/06/2014 0340   CL 109 11/05/2014 0808   CL 109 11/04/2014 1545   CO2 24 11/06/2014 0340   CO2 23 11/05/2014 0808   CO2 20* 11/04/2014 1545   CO2 23 10/19/2014 0824   GLUCOSE 106* 11/06/2014 0340   GLUCOSE 125* 11/05/2014 0808   GLUCOSE 145* 11/04/2014 1545   GLUCOSE 124 10/19/2014 0824   BUN 37* 11/06/2014 0340   BUN 37* 11/05/2014 0808    BUN 37* 11/04/2014 1545   BUN 33.2* 10/19/2014 0824   BUN 26* 10/06/2014   BUN 29* 09/06/2014   BUN 33* 08/30/2014   CREATININE 2.62* 11/06/2014 0340   CREATININE 2.51* 11/05/2014 0808   CREATININE 2.30* 11/04/2014 1545   CREATININE 2.7* 10/19/2014 0824   CREATININE 2.4* 10/06/2014   CREATININE 2.8* 09/06/2014   CREATININE 3.0* 08/30/2014   CALCIUM 8.2* 11/06/2014 0340   CALCIUM 8.1* 11/05/2014 0808   CALCIUM 8.9 11/04/2014 1545   CALCIUM 9.2 10/19/2014 0824   GFRNONAA 15* 11/06/2014 0340   GFRNONAA 16* 11/05/2014 0808   GFRNONAA 18* 11/04/2014 1545   GFRAA 18* 11/06/2014 0340   GFRAA 19* 11/05/2014 0808   GFRAA 21* 11/04/2014 1545   CBC    Component Value Date/Time   WBC 8.4 11/06/2014 0340   WBC 5.2 10/19/2014 0824   WBC 4.9 10/06/2014   RBC 3.52* 11/06/2014 0340   RBC 3.53* 10/19/2014 0824   HGB 10.5* 11/06/2014 0340   HGB 10.7* 10/19/2014 0824   HCT 32.6* 11/06/2014 0340   HCT 32.4* 10/19/2014 0824   HCT 27.6* 08/16/2014 2217   PLT 169 11/06/2014 0340   PLT 195 10/19/2014 0824   MCV 92.6 11/06/2014 0340   MCV 91.7 10/19/2014 0824   MCH 29.8 11/06/2014 0340  MCH 30.2 10/19/2014 0824   MCHC 32.2 11/06/2014 0340   MCHC 32.9 10/19/2014 0824   RDW 14.8 11/06/2014 0340   RDW 14.7* 10/19/2014 0824   LYMPHSABS 1.1 11/06/2014 0340   LYMPHSABS 1.2 10/19/2014 0824   MONOABS 1.3* 11/06/2014 0340   MONOABS 0.6 10/19/2014 0824   EOSABS 0.2 11/06/2014 0340   EOSABS 0.1 10/19/2014 0824   BASOSABS 0.0 11/06/2014 0340   BASOSABS 0.0 10/19/2014 0824   HEPATIC Function Panel  Recent Labs  08/17/14 0410 10/19/14 0824 11/04/14 1545  PROT 5.9* 6.3* 7.5   HEMOGLOBIN A1C No components found for: HGA1C,  MPG CARDIAC ENZYMES Lab Results  Component Value Date   TROPONINI 9.97* 11/05/2014   TROPONINI 10.28* 11/05/2014   TROPONINI 12.13* 11/05/2014   BNP No results for input(s): PROBNP in the last 8760 hours. TSH  Recent Labs  02/03/14 08/16/14 08/16/14 2217   TSH 2.29 2.78 2.227   CHOLESTEROL  Recent Labs  03/15/14 08/16/14 11/06/14 0340  CHOL 190 89 109    Scheduled Meds: . antiseptic oral rinse  7 mL Mouth Rinse q12n4p  . aspirin EC  162 mg Oral Daily  . atorvastatin  20 mg Oral q1800  . carvedilol  12.5 mg Oral BID WC  . chlorhexidine  15 mL Mouth Rinse BID  . cloNIDine  0.1 mg Oral TID  . latanoprost  1 drop Both Eyes QHS  . oxybutynin  5 mg Oral QHS  . pantoprazole  40 mg Oral Daily  . piperacillin-tazobactam (ZOSYN)  IV  2.25 g Intravenous Q6H  . polyvinyl alcohol  2 drop Both Eyes BID  . prednisoLONE acetate  1 drop Right Eye QID  . sodium chloride  3 mL Intravenous Q12H  . timolol  1 drop Both Eyes Daily   Continuous Infusions: . sodium chloride 10 mL/hr at 11/07/14 1642   PRN Meds:.sodium chloride, sodium chloride, alum & mag hydroxide-simeth, hydrALAZINE, labetalol, sodium chloride  Assessment/Plan: Acute left heart systolic failure-improving STEMI-anterior wall Acute pulmonary edema-resolving Ischemic cardiomyopathy Left breast cancer, untreated Mild MR and Moderate TR Moderate pulmonary hypertension Mild Aortic stenosis Hypertension Arthritis CKD, stage III Hyperlipidemia Glaucoma Senile osteoporosis  Cardiac cath in AM. Will minimize dye use with coronary injections only.    LOS: 3 days    [name]  MD  11/07/2014, 6:39 PM

## 2014-11-07 NOTE — Care Management Important Message (Signed)
Important Message  Patient Details  Name: [name] MRN: [id number] Date of Birth: [birth year]   Medicare Important Message Given:  Yes-second notification given    [name] May 11/07/2014, 4:08 PM

## 2014-11-08 ENCOUNTER — Encounter (HOSPITAL_COMMUNITY)
Admission: EM | Disposition: A | Discharge status: Skilled Nursing Facility | Payer: Self-pay | Source: Home / Self Care | Attending: Cardiovascular Disease

## 2014-11-08 ENCOUNTER — Encounter (HOSPITAL_COMMUNITY): Payer: Self-pay | Admitting: Cardiovascular Disease

## 2014-11-08 HISTORY — PX: CARDIAC CATHETERIZATION: SHX172

## 2014-11-08 LAB — RENAL FUNCTION PANEL
ANION GAP: 8 (ref 5–15)
Albumin: 2.9 g/dL — ABNORMAL LOW (ref 3.5–5.0)
BUN: 26 mg/dL — ABNORMAL HIGH (ref 6–20)
CHLORIDE: 108 mmol/L (ref 101–111)
CO2: 22 mmol/L (ref 22–32)
Calcium: 8.5 mg/dL — ABNORMAL LOW (ref 8.9–10.3)
Creatinine, Ser: 2.6 mg/dL — ABNORMAL HIGH (ref 0.44–1.00)
GFR calc non Af Amer: 15 mL/min — ABNORMAL LOW (ref >60)
GFR, EST AFRICAN AMERICAN: 18 mL/min — AB (ref >60)
Glucose, Bld: 104 mg/dL — ABNORMAL HIGH (ref 65–99)
POTASSIUM: 3.3 mmol/L — AB (ref 3.5–5.1)
Phosphorus: 3.8 mg/dL (ref 2.5–4.6)
Sodium: 138 mmol/L (ref 135–145)

## 2014-11-08 LAB — CBC WITH DIFFERENTIAL/PLATELET
Basophils Absolute: 0 10*3/uL (ref 0.0–0.1)
Basophils Relative: 0 %
Eosinophils Absolute: 0.2 10*3/uL (ref 0.0–0.7)
Eosinophils Relative: 3 %
HEMATOCRIT: 31.2 % — AB (ref 36.0–46.0)
Hemoglobin: 9.9 g/dL — ABNORMAL LOW (ref 12.0–15.0)
LYMPHS PCT: 23 %
Lymphs Abs: 1.4 10*3/uL (ref 0.7–4.0)
MCH: 29.2 pg (ref 26.0–34.0)
MCHC: 31.7 g/dL (ref 30.0–36.0)
MCV: 92 fL (ref 78.0–100.0)
MONO ABS: 0.8 10*3/uL (ref 0.1–1.0)
MONOS PCT: 12 %
NEUTROS ABS: 3.9 10*3/uL (ref 1.7–7.7)
Neutrophils Relative %: 62 %
Platelets: 158 10*3/uL (ref 150–400)
RBC: 3.39 MIL/uL — ABNORMAL LOW (ref 3.87–5.11)
RDW: 14.7 % (ref 11.5–15.5)
WBC: 6.2 10*3/uL (ref 4.0–10.5)

## 2014-11-08 LAB — MAGNESIUM: Magnesium: 2.2 mg/dL (ref 1.7–2.4)

## 2014-11-08 LAB — PROTIME-INR
INR: 1.26 (ref 0.00–1.49)
Prothrombin Time: 16 seconds — ABNORMAL HIGH (ref 11.6–15.2)

## 2014-11-08 LAB — GLUCOSE, CAPILLARY: Glucose-Capillary: 95 mg/dL (ref 65–99)

## 2014-11-08 SURGERY — LEFT HEART CATH AND CORONARY ANGIOGRAPHY

## 2014-11-08 MED ORDER — WHITE PETROLATUM GEL
Status: AC
  Administered 2014-11-08: 1
  Filled 2014-11-08: qty 1

## 2014-11-08 MED ORDER — METOPROLOL TARTRATE 1 MG/ML IV SOLN
INTRAVENOUS | Status: AC
  Filled 2014-11-08: qty 5

## 2014-11-08 MED ORDER — POTASSIUM CHLORIDE CRYS ER 20 MEQ PO TBCR
20.0000 meq | EXTENDED_RELEASE_TABLET | Freq: Once | ORAL | Status: AC
  Administered 2014-11-08: 20 meq via ORAL
  Filled 2014-11-08: qty 1

## 2014-11-08 MED ORDER — SODIUM CHLORIDE 0.9 % IJ SOLN: 3.0000 mL | INTRAMUSCULAR | Status: DC | PRN

## 2014-11-08 MED ORDER — SODIUM CHLORIDE 0.9 % IV SOLN: 250.0000 mL | INTRAVENOUS | Status: DC | PRN

## 2014-11-08 MED ORDER — ONDANSETRON HCL 4 MG/2ML IJ SOLN
4.0000 mg | Freq: Four times a day (QID) | INTRAMUSCULAR | Status: DC | PRN
  Administered 2014-11-08: 4 mg via INTRAVENOUS
  Filled 2014-11-08: qty 2

## 2014-11-08 MED ORDER — LIDOCAINE HCL (PF) 1 % IJ SOLN
INTRAMUSCULAR | Status: DC | PRN
  Administered 2014-11-08: 10:00:00

## 2014-11-08 MED ORDER — FENTANYL CITRATE (PF) 100 MCG/2ML IJ SOLN
INTRAMUSCULAR | Status: DC | PRN
  Administered 2014-11-08: 25 ug via INTRAVENOUS

## 2014-11-08 MED ORDER — IOHEXOL 350 MG/ML SOLN
INTRAVENOUS | Status: DC | PRN
  Administered 2014-11-08: 45 mL via INTRACARDIAC

## 2014-11-08 MED ORDER — HYDRALAZINE HCL 20 MG/ML IJ SOLN
INTRAMUSCULAR | Status: DC | PRN
  Administered 2014-11-08 (×2): 10 mg via INTRAVENOUS

## 2014-11-08 MED ORDER — MIDAZOLAM HCL 2 MG/2ML IJ SOLN
INTRAMUSCULAR | Status: AC
  Filled 2014-11-08: qty 4

## 2014-11-08 MED ORDER — MIDAZOLAM HCL 2 MG/2ML IJ SOLN
INTRAMUSCULAR | Status: DC | PRN
  Administered 2014-11-08: 0.5 mg via INTRAVENOUS

## 2014-11-08 MED ORDER — HYDRALAZINE HCL 20 MG/ML IJ SOLN
INTRAMUSCULAR | Status: AC
  Filled 2014-11-08: qty 1

## 2014-11-08 MED ORDER — ACETAMINOPHEN 325 MG PO TABS: 650.0000 mg | ORAL_TABLET | ORAL | Status: DC | PRN

## 2014-11-08 MED ORDER — SODIUM CHLORIDE 0.9 % IJ SOLN
3.0000 mL | Freq: Two times a day (BID) | INTRAMUSCULAR | Status: DC
  Administered 2014-11-08 – 2014-11-10 (×5): 3 mL via INTRAVENOUS

## 2014-11-08 MED ORDER — SODIUM CHLORIDE 0.9 % IJ SOLN
3.0000 mL | Freq: Two times a day (BID) | INTRAMUSCULAR | Status: DC
Start: 2014-11-08 — End: 2014-11-09
  Administered 2014-11-08 (×2): 3 mL via INTRAVENOUS

## 2014-11-08 MED ORDER — LIDOCAINE HCL (PF) 1 % IJ SOLN
INTRAMUSCULAR | Status: DC | PRN
  Administered 2014-11-08: 15 mL via SUBCUTANEOUS

## 2014-11-08 MED ORDER — FENTANYL CITRATE (PF) 100 MCG/2ML IJ SOLN
INTRAMUSCULAR | Status: AC
  Filled 2014-11-08: qty 4

## 2014-11-08 SURGICAL SUPPLY — 7 items
CATH INFINITI 5FR JL4 (CATHETERS) ×1 IMPLANT
CATH INFINITI JR4 5F (CATHETERS) ×1 IMPLANT
KIT HEART LEFT (KITS) ×2 IMPLANT
PACK CARDIAC CATHETERIZATION (CUSTOM PROCEDURE TRAY) ×2 IMPLANT
SHEATH PINNACLE 5F 10CM (SHEATH) ×2 IMPLANT
TRANSDUCER W/STOPCOCK (MISCELLANEOUS) ×2 IMPLANT
WIRE EMERALD 3MM-J .035X150CM (WIRE) ×2 IMPLANT

## 2014-11-08 NOTE — Interval H&P Note (Signed)
History and Physical Interval Note:  11/08/2014 10:05 AM  [name]  has presented today for surgery, with the diagnosis of cp  The various methods of treatment have been discussed with the patient and family. After consideration of risks, benefits and other options for treatment, the patient has consented to  Procedure(s): Left Heart Cath and Coronary Angiography (N/A) as a surgical intervention .  [name] history has been reviewed, patient examined, no change in status, stable for surgery.  I have reviewed the patient's chart and labs.  Questions were answered to the patient's satisfaction.     KADAKIA,AJAY S

## 2014-11-08 NOTE — Progress Notes (Signed)
ANTIBIOTIC CONSULT NOTE - Follow up  Pharmacy Consult for Zosyn Indication: HCAP/Sepsis, enterococcal UTI  Allergies  Allergen Reactions  . Sulfa Antibiotics Itching    Patient Measurements: Height: 5\' 6"  (167.6 cm) Weight: 137 lb 2 oz (62.2 kg) IBW/kg (Calculated) : 59.3  Vital Signs: Temp: 98.5 F (36.9 C) (09/20 0745) Temp Source: Oral (09/20 0745) BP: 168/70 mmHg (09/20 0800) Pulse Rate: 62 (09/20 0800) Intake/Output from previous day: 09/19 0701 - 09/20 0700 In: 600 [P.O.:480; I.V.:70; IV Piggyback:50] Out: 425 [Urine:425]  Labs:  Recent Labs  11/06/14 0340 11/08/14 0316  WBC 8.4 6.2  HGB 10.5* 9.9*  PLT 169 158  CREATININE 2.62* 2.60*   Estimated Creatinine Clearance: 14.3 mL/min (by C-G formula based on Cr of 2.6).  Recent Labs  11/05/14 1651  VANCORANDOM 16     Assessment: 81 yo female nursing home patient continuing on Zosyn for HCAP/sepsis originally, now with enterococcal UTI (S-ampicillin). - Lactic acid increased to 2.1 (9/17), PCT 54 (9/18) - Now Afeb, WBC WNL  9/16 >> Vanc >> 9/18 9/16 >> Zosyn >>  9/17 Resp viral panel: neg 9/16 blood x 2: ngtd 9/16 urine: >100,000 colonies enterococcus (S-amp, levo) MRSA pcr neg   Goal of Therapy:  Vancomycin trough level 15-20 mcg/ml  Appropriate abx dosing, eradication of infection.   Plan:   Continue Zosyn 2.25g IV q6h Follow up renal function & cultures, clinical course Possibly de-escalate abx to ampicillin today?   Elicia Lamp, PharmD Clinical Pharmacist Pager [phone] 11/08/2014 10:40 AM

## 2014-11-08 NOTE — Progress Notes (Signed)
Report received from Mickel Baas, RN in Cath Lab. Patient will return to [facility] shortly.

## 2014-11-08 NOTE — H&P (View-Only) (Signed)
Ref: GREEN, Beth Spare, MD   Subjective:  No chest pain. Mild respiratory distress at times.  Objective:  Vital Signs in the last 24 hours: Temp:  [97.5 F (36.4 C)-98.4 F (36.9 C)] 97.5 F (36.4 C) (09/19 1527) Pulse Rate:  [61-70] 67 (09/19 1708) Cardiac Rhythm:  [-] Normal sinus rhythm (09/19 1200) Resp:  [17-26] 23 (09/19 1200) BP: (129-180)/(60-97) 153/68 mmHg (09/19 1708) SpO2:  [98 %-100 %] 100 % (09/19 1200) Weight:  [60.5 kg (133 lb 6.1 oz)-62.1 kg (136 lb 14.5 oz)] 62.1 kg (136 lb 14.5 oz) (09/19 1632)  Physical Exam: BP Readings from Last 1 Encounters:  11/07/14 153/68    Wt Readings from Last 1 Encounters:  11/07/14 62.1 kg (136 lb 14.5 oz)    Weight change: -0.8 kg (-1 lb 12.2 oz)  HEENT: [place]/AT, Eyes-PERL, EOMI, Conjunctiva-Pink, Sclera-Non-icteric Neck: No JVD, No bruit, Trachea midline. Lungs:  Clearing with few basal crackles, Bilateral. Cardiac:  Regular rhythm, normal S1 and S2, no S3.  Abdomen:  Soft, non-tender. Extremities:  No edema present. No cyanosis. No clubbing. CNS: AxOx2, Cranial nerves grossly intact, moves all 4 extremities. Right handed. Skin: Warm and dry.   Intake/Output from previous day: 09/18 0701 - 09/19 0700 In: 210 [I.V.:10; IV Piggyback:200] Out: 2025 [Urine:2025]    Lab Results: BMET    Component Value Date/Time   NA 142 11/06/2014 0340   NA 142 11/05/2014 0808   NA 140 11/04/2014 1545   NA 144 10/19/2014 0824   NA 141 10/06/2014   NA 143 09/06/2014   NA 143 08/30/2014   K 3.4* 11/06/2014 0340   K 3.8 11/05/2014 0808   K 4.0 11/04/2014 1545   K 3.9 10/19/2014 0824   CL 109 11/06/2014 0340   CL 109 11/05/2014 0808   CL 109 11/04/2014 1545   CO2 24 11/06/2014 0340   CO2 23 11/05/2014 0808   CO2 20* 11/04/2014 1545   CO2 23 10/19/2014 0824   GLUCOSE 106* 11/06/2014 0340   GLUCOSE 125* 11/05/2014 0808   GLUCOSE 145* 11/04/2014 1545   GLUCOSE 124 10/19/2014 0824   BUN 37* 11/06/2014 0340   BUN 37* 11/05/2014 0808    BUN 37* 11/04/2014 1545   BUN 33.2* 10/19/2014 0824   BUN 26* 10/06/2014   BUN 29* 09/06/2014   BUN 33* 08/30/2014   CREATININE 2.62* 11/06/2014 0340   CREATININE 2.51* 11/05/2014 0808   CREATININE 2.30* 11/04/2014 1545   CREATININE 2.7* 10/19/2014 0824   CREATININE 2.4* 10/06/2014   CREATININE 2.8* 09/06/2014   CREATININE 3.0* 08/30/2014   CALCIUM 8.2* 11/06/2014 0340   CALCIUM 8.1* 11/05/2014 0808   CALCIUM 8.9 11/04/2014 1545   CALCIUM 9.2 10/19/2014 0824   GFRNONAA 15* 11/06/2014 0340   GFRNONAA 16* 11/05/2014 0808   GFRNONAA 18* 11/04/2014 1545   GFRAA 18* 11/06/2014 0340   GFRAA 19* 11/05/2014 0808   GFRAA 21* 11/04/2014 1545   CBC    Component Value Date/Time   WBC 8.4 11/06/2014 0340   WBC 5.2 10/19/2014 0824   WBC 4.9 10/06/2014   RBC 3.52* 11/06/2014 0340   RBC 3.53* 10/19/2014 0824   HGB 10.5* 11/06/2014 0340   HGB 10.7* 10/19/2014 0824   HCT 32.6* 11/06/2014 0340   HCT 32.4* 10/19/2014 0824   HCT 27.6* 08/16/2014 2217   PLT 169 11/06/2014 0340   PLT 195 10/19/2014 0824   MCV 92.6 11/06/2014 0340   MCV 91.7 10/19/2014 0824   MCH 29.8 11/06/2014 0340  MCH 30.2 10/19/2014 0824   MCHC 32.2 11/06/2014 0340   MCHC 32.9 10/19/2014 0824   RDW 14.8 11/06/2014 0340   RDW 14.7* 10/19/2014 0824   LYMPHSABS 1.1 11/06/2014 0340   LYMPHSABS 1.2 10/19/2014 0824   MONOABS 1.3* 11/06/2014 0340   MONOABS 0.6 10/19/2014 0824   EOSABS 0.2 11/06/2014 0340   EOSABS 0.1 10/19/2014 0824   BASOSABS 0.0 11/06/2014 0340   BASOSABS 0.0 10/19/2014 0824   HEPATIC Function Panel  Recent Labs  08/17/14 0410 10/19/14 0824 11/04/14 1545  PROT 5.9* 6.3* 7.5   HEMOGLOBIN A1C No components found for: HGA1C,  MPG CARDIAC ENZYMES Lab Results  Component Value Date   TROPONINI 9.97* 11/05/2014   TROPONINI 10.28* 11/05/2014   TROPONINI 12.13* 11/05/2014   BNP No results for input(s): PROBNP in the last 8760 hours. TSH  Recent Labs  02/03/14 08/16/14 08/16/14 2217   TSH 2.29 2.78 2.227   CHOLESTEROL  Recent Labs  03/15/14 08/16/14 11/06/14 0340  CHOL 190 89 109    Scheduled Meds: . antiseptic oral rinse  7 mL Mouth Rinse q12n4p  . aspirin EC  162 mg Oral Daily  . atorvastatin  20 mg Oral q1800  . carvedilol  12.5 mg Oral BID WC  . chlorhexidine  15 mL Mouth Rinse BID  . cloNIDine  0.1 mg Oral TID  . latanoprost  1 drop Both Eyes QHS  . oxybutynin  5 mg Oral QHS  . pantoprazole  40 mg Oral Daily  . piperacillin-tazobactam (ZOSYN)  IV  2.25 g Intravenous Q6H  . polyvinyl alcohol  2 drop Both Eyes BID  . prednisoLONE acetate  1 drop Right Eye QID  . sodium chloride  3 mL Intravenous Q12H  . timolol  1 drop Both Eyes Daily   Continuous Infusions: . sodium chloride 10 mL/hr at 11/07/14 1642   PRN Meds:.sodium chloride, sodium chloride, alum & mag hydroxide-simeth, hydrALAZINE, labetalol, sodium chloride  Assessment/Plan: Acute left heart systolic failure-improving STEMI-anterior wall Acute pulmonary edema-resolving Ischemic cardiomyopathy Left breast cancer, untreated Mild MR and Moderate TR Moderate pulmonary hypertension Mild Aortic stenosis Hypertension Arthritis CKD, stage III Hyperlipidemia Glaucoma Senile osteoporosis  Cardiac cath in AM. Will minimize dye use with coronary injections only.    LOS: 3 days    [name]  MD  11/07/2014, 6:39 PM

## 2014-11-08 NOTE — Progress Notes (Signed)
[name], NT removed ring on right hand. Ring has blue and white stones on a gold band. Ring was given to daughter.  Ring on left hand was unable to be removed.

## 2014-11-08 NOTE — Progress Notes (Signed)
Site area: right groin a 5 french arterial sheath was removed  Site Prior to Removal:  Level 0  Pressure Applied For 20 MINUTES    Minutes Beginning at 1100a  Manual:   Yes.    Patient Status During Pull:  stable  Post Pull Groin Site:  Level 0  Post Pull Instructions Given:  Yes.    Post Pull Pulses Present:  Yes.    Dressing Applied:  Yes.    Comments:  VS remain stable during sheath pull.

## 2014-11-09 ENCOUNTER — Telehealth: Payer: Self-pay | Admitting: *Deleted

## 2014-11-09 ENCOUNTER — Encounter: Payer: Self-pay | Admitting: Internal Medicine

## 2014-11-09 LAB — CULTURE, BLOOD (ROUTINE X 2)
Culture: NO GROWTH
Culture: NO GROWTH

## 2014-11-09 LAB — CBC WITH DIFFERENTIAL/PLATELET
Basophils Absolute: 0 10*3/uL (ref 0.0–0.1)
Basophils Relative: 0 %
EOS ABS: 0.2 10*3/uL (ref 0.0–0.7)
EOS PCT: 2 %
HCT: 31.1 % — ABNORMAL LOW (ref 36.0–46.0)
Hemoglobin: 9.8 g/dL — ABNORMAL LOW (ref 12.0–15.0)
LYMPHS ABS: 1.3 10*3/uL (ref 0.7–4.0)
LYMPHS PCT: 15 %
MCH: 29.1 pg (ref 26.0–34.0)
MCHC: 31.5 g/dL (ref 30.0–36.0)
MCV: 92.3 fL (ref 78.0–100.0)
MONOS PCT: 13 %
Monocytes Absolute: 1.1 10*3/uL — ABNORMAL HIGH (ref 0.1–1.0)
Neutro Abs: 5.8 10*3/uL (ref 1.7–7.7)
Neutrophils Relative %: 70 %
PLATELETS: 146 10*3/uL — AB (ref 150–400)
RBC: 3.37 MIL/uL — ABNORMAL LOW (ref 3.87–5.11)
RDW: 14.9 % (ref 11.5–15.5)
WBC: 8.3 10*3/uL (ref 4.0–10.5)

## 2014-11-09 LAB — RENAL FUNCTION PANEL
ANION GAP: 7 (ref 5–15)
Albumin: 2.8 g/dL — ABNORMAL LOW (ref 3.5–5.0)
BUN: 23 mg/dL — ABNORMAL HIGH (ref 6–20)
CALCIUM: 8.6 mg/dL — AB (ref 8.9–10.3)
CHLORIDE: 109 mmol/L (ref 101–111)
CO2: 21 mmol/L — AB (ref 22–32)
CREATININE: 2.57 mg/dL — AB (ref 0.44–1.00)
GFR calc non Af Amer: 16 mL/min — ABNORMAL LOW (ref >60)
GFR, EST AFRICAN AMERICAN: 18 mL/min — AB (ref >60)
GLUCOSE: 98 mg/dL (ref 65–99)
Phosphorus: 4.4 mg/dL (ref 2.5–4.6)
Potassium: 4.3 mmol/L (ref 3.5–5.1)
SODIUM: 137 mmol/L (ref 135–145)

## 2014-11-09 LAB — MAGNESIUM: Magnesium: 2.2 mg/dL (ref 1.7–2.4)

## 2014-11-09 MED ORDER — SODIUM CHLORIDE 0.9 % IV BOLUS (SEPSIS)
250.0000 mL | Freq: Once | INTRAVENOUS | Status: AC
  Administered 2014-11-09: 250 mL via INTRAVENOUS

## 2014-11-09 NOTE — Progress Notes (Signed)
Ref: GREEN, [name], MD   Subjective:  Decreased urine output. No chest pain. Afebrile. No respiratory distress.  Objective:  Vital Signs in the last 24 hours: Temp:  [97.9 F (36.6 C)-98.6 F (37 C)] 98.3 F (36.8 C) (09/21 1554) Pulse Rate:  [58-70] 62 (09/21 1800) Cardiac Rhythm:  [-] Normal sinus rhythm (09/21 0800) Resp:  [16-26] 16 (09/21 1800) BP: (123-188)/(49-94) 142/71 mmHg (09/21 1800) SpO2:  [98 %-100 %] 99 % (09/21 1800) Weight:  [60.1 kg (132 lb 7.9 oz)] 60.1 kg (132 lb 7.9 oz) (09/21 0500)  Physical Exam: BP Readings from Last 1 Encounters:  11/09/14 142/71    Wt Readings from Last 1 Encounters:  11/09/14 60.1 kg (132 lb 7.9 oz)    Weight change: -2 kg (-4 lb 6.6 oz)  HEENT: [place]/AT, Eyes-PERL, EOMI, Conjunctiva-Pink, Sclera-Non-icteric Neck: No JVD, No bruit, Trachea midline. Lungs:  Clearing, Bilateral. Cardiac:  Regular rhythm, normal S1 and S2, no S3. II/VI systolic murmur. Abdomen:  Soft, non-tender. Extremities:  No edema present. No cyanosis. No clubbing. No right groin hematoma. CNS: AxOx2, Cranial nerves grossly intact, moves all 4 extremities. Right handed. Skin: Warm and dry.   Intake/Output from previous day: 09/20 0701 - 09/21 0700 In: 859 [P.O.:240; I.V.:419; IV Piggyback:200] Out: 670 [Urine:670]    Lab Results: BMET    Component Value Date/Time   NA 137 11/09/2014 0230   NA 138 11/08/2014 0316   NA 142 11/06/2014 0340   NA 144 10/19/2014 0824   NA 141 10/06/2014   NA 143 09/06/2014   NA 143 08/30/2014   K 4.3 11/09/2014 0230   K 3.3* 11/08/2014 0316   K 3.4* 11/06/2014 0340   K 3.9 10/19/2014 0824   CL 109 11/09/2014 0230   CL 108 11/08/2014 0316   CL 109 11/06/2014 0340   CO2 21* 11/09/2014 0230   CO2 22 11/08/2014 0316   CO2 24 11/06/2014 0340   CO2 23 10/19/2014 0824   GLUCOSE 98 11/09/2014 0230   GLUCOSE 104* 11/08/2014 0316   GLUCOSE 106* 11/06/2014 0340   GLUCOSE 124 10/19/2014 0824   BUN 23* 11/09/2014 0230   BUN 26* 11/08/2014 0316   BUN 37* 11/06/2014 0340   BUN 33.2* 10/19/2014 0824   BUN 26* 10/06/2014   BUN 29* 09/06/2014   BUN 33* 08/30/2014   CREATININE 2.57* 11/09/2014 0230   CREATININE 2.60* 11/08/2014 0316   CREATININE 2.62* 11/06/2014 0340   CREATININE 2.7* 10/19/2014 0824   CREATININE 2.4* 10/06/2014   CREATININE 2.8* 09/06/2014   CREATININE 3.0* 08/30/2014   CALCIUM 8.6* 11/09/2014 0230   CALCIUM 8.5* 11/08/2014 0316   CALCIUM 8.2* 11/06/2014 0340   CALCIUM 9.2 10/19/2014 0824   GFRNONAA 16* 11/09/2014 0230   GFRNONAA 15* 11/08/2014 0316   GFRNONAA 15* 11/06/2014 0340   GFRAA 18* 11/09/2014 0230   GFRAA 18* 11/08/2014 0316   GFRAA 18* 11/06/2014 0340   CBC    Component Value Date/Time   WBC 8.3 11/09/2014 0230   WBC 5.2 10/19/2014 0824   WBC 4.9 10/06/2014   RBC 3.37* 11/09/2014 0230   RBC 3.53* 10/19/2014 0824   HGB 9.8* 11/09/2014 0230   HGB 10.7* 10/19/2014 0824   HCT 31.1* 11/09/2014 0230   HCT 32.4* 10/19/2014 0824   HCT 27.6* 08/16/2014 2217   PLT 146* 11/09/2014 0230   PLT 195 10/19/2014 0824   MCV 92.3 11/09/2014 0230   MCV 91.7 10/19/2014 0824   MCH 29.1 11/09/2014 0230  MCH 30.2 10/19/2014 0824   MCHC 31.5 11/09/2014 0230   MCHC 32.9 10/19/2014 0824   RDW 14.9 11/09/2014 0230   RDW 14.7* 10/19/2014 0824   LYMPHSABS 1.3 11/09/2014 0230   LYMPHSABS 1.2 10/19/2014 0824   MONOABS 1.1* 11/09/2014 0230   MONOABS 0.6 10/19/2014 0824   EOSABS 0.2 11/09/2014 0230   EOSABS 0.1 10/19/2014 0824   BASOSABS 0.0 11/09/2014 0230   BASOSABS 0.0 10/19/2014 0824   HEPATIC Function Panel  Recent Labs  08/17/14 0410 10/19/14 0824 11/04/14 1545  PROT 5.9* 6.3* 7.5   HEMOGLOBIN A1C No components found for: HGA1C,  MPG CARDIAC ENZYMES Lab Results  Component Value Date   TROPONINI 9.97* 11/05/2014   TROPONINI 10.28* 11/05/2014   TROPONINI 12.13* 11/05/2014   BNP No results for input(s): PROBNP in the last 8760 hours. TSH  Recent Labs  02/03/14  08/16/14 08/16/14 2217  TSH 2.29 2.78 2.227   CHOLESTEROL  Recent Labs  03/15/14 08/16/14 11/06/14 0340  CHOL 190 89 109    Scheduled Meds: . antiseptic oral rinse  7 mL Mouth Rinse q12n4p  . aspirin EC  162 mg Oral Daily  . atorvastatin  20 mg Oral q1800  . carvedilol  12.5 mg Oral BID WC  . chlorhexidine  15 mL Mouth Rinse BID  . cloNIDine  0.1 mg Oral TID  . latanoprost  1 drop Both Eyes QHS  . oxybutynin  5 mg Oral QHS  . pantoprazole  40 mg Oral Daily  . piperacillin-tazobactam (ZOSYN)  IV  2.25 g Intravenous Q6H  . polyvinyl alcohol  2 drop Both Eyes BID  . prednisoLONE acetate  1 drop Right Eye QID  . sodium chloride  250 mL Intravenous Once  . sodium chloride  3 mL Intravenous Q12H  . timolol  1 drop Both Eyes Daily   Continuous Infusions:  PRN Meds:.sodium chloride, acetaminophen, alum & mag hydroxide-simeth, hydrALAZINE, labetalol, ondansetron (ZOFRAN) IV, sodium chloride  Assessment/Plan: Acute left heart systolic failure-improving STEMI-anterior wall Acute pulmonary edema-resolving Ischemic cardiomyopathy Left breast cancer, untreated Mild MR and Moderate TR Moderate pulmonary hypertension Mild Aortic stenosis Hypertension Arthritis CKD, stage III Hyperlipidemia Glaucoma Senile osteoporosis  Small fluid boluses as tolerated. Chest XRay in AM.    LOS: 5 days    [name]  MD  11/09/2014, 8:29 PM

## 2014-11-09 NOTE — Telephone Encounter (Signed)
Robin called from Dr. Fanny Skates office regarding the medical clearance for the patient, I returned her call to inform her that I would try to have this completed by the end of the day.

## 2014-11-09 NOTE — Clinical Social Work Note (Signed)
CSW reviewed chart. Once pt is medically stable she will transition to [facility] SNF. CSW will continue to follow and assistant with discharge needs.   [place], MSW, [place]

## 2014-11-09 NOTE — Progress Notes (Signed)
UR COMPLETED  

## 2014-11-10 ENCOUNTER — Inpatient Hospital Stay (HOSPITAL_COMMUNITY): Payer: Medicare Other

## 2014-11-10 LAB — CBC WITH DIFFERENTIAL/PLATELET
Basophils Absolute: 0 10*3/uL (ref 0.0–0.1)
Basophils Relative: 0 %
EOS ABS: 0.2 10*3/uL (ref 0.0–0.7)
EOS PCT: 3 %
HEMATOCRIT: 29.3 % — AB (ref 36.0–46.0)
HEMOGLOBIN: 9.2 g/dL — AB (ref 12.0–15.0)
LYMPHS PCT: 17 %
Lymphs Abs: 1.3 10*3/uL (ref 0.7–4.0)
MCH: 29 pg (ref 26.0–34.0)
MCHC: 31.4 g/dL (ref 30.0–36.0)
MCV: 92.4 fL (ref 78.0–100.0)
MONOS PCT: 12 %
Monocytes Absolute: 0.9 10*3/uL (ref 0.1–1.0)
NEUTROS ABS: 5.3 10*3/uL (ref 1.7–7.7)
Neutrophils Relative %: 68 %
Platelets: 134 10*3/uL — ABNORMAL LOW (ref 150–400)
RBC: 3.17 MIL/uL — AB (ref 3.87–5.11)
RDW: 14.7 % (ref 11.5–15.5)
WBC: 7.7 10*3/uL (ref 4.0–10.5)

## 2014-11-10 LAB — FERRITIN: FERRITIN: 65 ng/mL (ref 11–307)

## 2014-11-10 LAB — RENAL FUNCTION PANEL
ANION GAP: 8 (ref 5–15)
Albumin: 2.8 g/dL — ABNORMAL LOW (ref 3.5–5.0)
BUN: 22 mg/dL — AB (ref 6–20)
CHLORIDE: 108 mmol/L (ref 101–111)
CO2: 21 mmol/L — ABNORMAL LOW (ref 22–32)
Calcium: 8.6 mg/dL — ABNORMAL LOW (ref 8.9–10.3)
Creatinine, Ser: 2.69 mg/dL — ABNORMAL HIGH (ref 0.44–1.00)
GFR calc Af Amer: 17 mL/min — ABNORMAL LOW (ref >60)
GFR calc non Af Amer: 15 mL/min — ABNORMAL LOW (ref >60)
GLUCOSE: 95 mg/dL (ref 65–99)
PHOSPHORUS: 4.3 mg/dL (ref 2.5–4.6)
POTASSIUM: 3.8 mmol/L (ref 3.5–5.1)
Sodium: 137 mmol/L (ref 135–145)

## 2014-11-10 LAB — IRON AND TIBC
IRON: 36 ug/dL (ref 28–170)
Saturation Ratios: 14 % (ref 10.4–31.8)
TIBC: 249 ug/dL — AB (ref 250–450)
UIBC: 213 ug/dL

## 2014-11-10 MED ORDER — FUROSEMIDE 10 MG/ML IJ SOLN
40.0000 mg | Freq: Once | INTRAMUSCULAR | Status: AC
  Administered 2014-11-10: 40 mg via INTRAVENOUS
  Filled 2014-11-10: qty 4

## 2014-11-10 MED ORDER — FUROSEMIDE 40 MG PO TABS: 40.0000 mg | ORAL_TABLET | Freq: Once | ORAL | Status: DC

## 2014-11-10 MED ORDER — FUROSEMIDE 40 MG PO TABS: 40.0000 mg | ORAL_TABLET | Freq: Every day | ORAL | Status: DC

## 2014-11-10 NOTE — Discharge Summary (Signed)
Physician Discharge Summary  Patient ID: [name] MRN: [id number] DOB/AGE: 79-Aug-1927 79 y.o.  Admit date: 11/04/2014 Discharge date: 11/10/2014  Admission Diagnoses: Flash pulmonary Edema Hypertension Acute kidney injury on CKD Arhtritis Hyperlipidemia Glaucoma Senile osteoporosis UTI  Discharge Diagnoses:  Principle Problem: * Acute left heart systolic failure * Sepsis of pulmonary origin Acute pulmonary edema Acute kidney injury on CKD, III STEMI-anterior wall Ischemic cardiomyopathy Enterococcus species UTI Left breast cancer, untreated Mild MR and Moderate TR Moderate pulmonary hypertension Mild Aortic stenosis Hypertension Arthritis CKD, stage III Hyperlipidemia Glaucoma Senile osteoporosis Anemia of blood loss  Discharged Condition: fair  Hospital Course: 81 year old female presented with acute pulmonary edema secondary to anterior wall myocardial infarction and sepsis of pulmonary origin. She also had UTI. She was treated with lasix and IV antibiotics. Her condition stabilized. She and daughter wanted cardiac cath to determine ischemia burden and possible treatment options as she was going to have left breast lumpectomy next month. She has multi-vessel native vessel CAD and if her renal function remains stable she may undergo angioplasty/stent in 1-2 weeks otherwise she will be treated medically. She may need GI evaluation if her condition stabilizes. She was transferred to SNF in stable condition but her overall prognosis remains poor with multiorgan disease.  Consults: cardiology and pulmonary/intensive care  Significant Diagnostic Studies: labs: WBC count was 19.4 K, Hgb and platelets count were normal. BUN was 37 and creatinine was 2.30. BNP was 618.3 Urine culture showed over 100,000 colonies of enterococcus species sensitive to all antibiotics tested. Troponin-I peaked at 12.13  EKG-ST elevation in V1, V2 suggestive of acute anterior wall MI.  Chest  x-ray showed pulmonary edema, repeat chest x-ray showed mild improvement.  Echocardiogram showed : - Left ventricle: The cavity size was mildly dilated. There was mild concentric hypertrophy. Systolic function was moderately reduced. The estimated ejection fraction was in the range of 35% to 40%. There is severe hypokinesis of the entire anteroseptal myocardium; consistent with ischemia. Doppler parameters are consistent with abnormal left ventricular relaxation (grade 1 diastolic dysfunction). - Aortic valve: Transvalvular velocity was increased, due to stenosis. There was mild stenosis. - Mitral valve: Calcified annulus. There was moderate regurgitation. - Left atrium: The atrium was moderately to severely dilated. - Right atrium: The atrium was moderately dilated. - Tricuspid valve: There was severe regurgitation. - Pulmonary arteries: Systolic pressure was mildly to moderately increased. - Pericardium, extracardiac: A trivial pericardial effusion was identified.  Cardiac cath showed :       Mid RCA lesion, 20% stenosed. The lesion was not previously treated.  Mid RCA to Dist RCA lesion, 20% stenosed. The lesion was not previously treated.  Ost Cx lesion, 30% stenosed. The lesion was not previously treated.  Ost 1st Diag lesion, 40% stenosed. The lesion was not previously treated.  1st Diag lesion, 100% stenosed. The lesion was not previously treated.  Lat Ramus lesion, 95% stenosed. The lesion was not previously treated.  Prox LAD-2 lesion, 75% stenosed. The lesion was not previously treated.          Prox LAD-1 lesion, 30% stenosed. The lesion was not previously treated.  Treatments: cardiac meds: Furosemide, carvedilol and atorvastatin.  Discharge Exam: Blood pressure 168/77, pulse 63, temperature 98.4 F (36.9 C), temperature source Oral, resp. rate 25, height 5\' 6"  (1.676 m), weight 60.4 kg (133 lb 2.5 oz), SpO2 100 %. HEENT: [place]/AT, Eyes-PERL, EOMI, Conjunctiva-Pink,  Sclera-Non-icteric Neck: No JVD, No bruit, Trachea midline. Lungs: Clearing, Bilateral. Cardiac:  Regular rhythm, normal S1 and S2, no S3. II/VI systolic murmur. Abdomen: Soft, non-tender. Extremities: No edema present. No cyanosis. No clubbing. No right groin hematoma. CNS: AxOx2, Cranial nerves grossly intact, moves all 4 extremities. Right handed. Skin: Warm and dry  Disposition: 06-Home-Health Care Svc/SNF     Medication List    STOP taking these medications        losartan 50 MG tablet  Commonly known as:  COZAAR      TAKE these medications        acetaminophen 325 MG tablet  Commonly known as:  TYLENOL  Take 2 tablets (650 mg total) by mouth every 6 (six) hours as needed for mild pain (or Fever >/= 101).     alum & mag hydroxide-simeth 200-200-20 MG/5ML suspension  Commonly known as:  MAALOX/MYLANTA  Take by mouth. 16ml every 4 hours as needed for indigestion     atorvastatin 20 MG tablet  Commonly known as:  LIPITOR  Take one tablet by mouth once daily for cholesterol     BETIMOL 0.5 % ophthalmic solution  Generic drug:  timolol  Place 1 drop into both eyes every morning.     carvedilol 3.125 MG tablet  Commonly known as:  COREG  Take one tablet by mouth twice daily with a meal     cloNIDine 0.1 MG tablet  Commonly known as:  CATAPRES  Take 0.1 mg by mouth 2 (two) times daily.     ferrous sulfate 325 (65 FE) MG tablet  Take 325 mg by mouth daily with breakfast.     furosemide 40 MG tablet  Commonly known as:  LASIX  Take 1 tablet (40 mg total) by mouth daily.     latanoprost 0.005 % ophthalmic solution  Commonly known as:  XALATAN  Place 1 drop into both eyes at bedtime.     mirtazapine 7.5 MG tablet  Commonly known as:  REMERON  TAKE 1 TABLET (7.5 MG TOTAL) BY MOUTH AT BEDTIME. NIGHTLY FOR APPETITE     omeprazole 20 MG capsule  Commonly known as:  PRILOSEC  Take 20 mg by mouth daily.     oxybutynin 5 MG 24 hr tablet  Commonly known as:   DITROPAN-XL  Take 5 mg by mouth at bedtime.     polyvinyl alcohol 1.4 % ophthalmic solution  Commonly known as:  LIQUIFILM TEARS  Place 2 drops into both eyes 2 (two) times daily.     prednisoLONE acetate 1 % ophthalmic suspension  Commonly known as:  PRED FORTE  Place 1 drop into the right eye 4 (four) times daily. For 5 Days.     vitamin B-12 1000 MCG tablet  Commonly known as:  CYANOCOBALAMIN  Take 1,000 mcg by mouth 2 (two) times daily. Take one tablet by mouth twice daily on Mondays,Wednesdays and Fridays           Follow-up Information    Follow up with GREEN, [name], MD. Schedule an appointment as soon as possible for a visit in 2 weeks.   Specialty:  Internal Medicine   Contact information:   [place] [address] [phone]       Follow up with [facility], MD. Schedule an appointment as soon as possible for a visit in 1 week.   Specialty:  Cardiology   Contact information:   [place] [address] [phone]       Signed: Birdie Riddle 11/10/2014, 5:25 PM

## 2014-11-10 NOTE — Clinical Social Work Note (Signed)
CSW spoke with the pt's daughter Benjamine Mola regarding discharge to [place] discussed ambulance transport.  CSW contact [facility] at [phone] to schedule transport for the pt. CSW will upload the pt's discharge summary. Bedside RN can call reported to [phone].   [place], MSW, [place]

## 2014-11-10 NOTE — Care Management Important Message (Signed)
Important Message  Patient Details  Name: [name] MRN: [id number] Date of Birth: 07-May-1925   Medicare Important Message Given:  Yes-third notification given    Delorse Lek 11/10/2014, 9:15 AM

## 2014-11-10 NOTE — Evaluation (Addendum)
Physical Therapy Evaluation Patient Details Name: [name] MRN: [id number] DOB: 1925/07/14 Today's Date: 11/10/2014   History of Present Illness  81 y/o woman with hx of CKD diastolic dysfunction who presents with fever, dyspnea, and hypertension. Pt s/p cardiac cath 9/20.   Clinical Impression   Pt admitted with/for fever dyspnea and HTN, s/p cath.  Pt currently limited functionally due to the problems listed. ( See problems list.)   Pt will benefit from rehab at [facility] rehab before going back to her own apartment.  Will d/c from acute PT in anticipation of discharge soon.     Follow Up Recommendations SNF    Equipment Recommendations       Recommendations for Other Services       Precautions / Restrictions Precautions Precautions: Fall Restrictions Weight Bearing Restrictions: No      Mobility  Bed Mobility Overal bed mobility: Needs Assistance Bed Mobility: Supine to Sit     Supine to sit: HOB elevated;Supervision     General bed mobility comments: Close supervision for safety. Cues to bring hips forward to get bilateral feet onto floor.   Transfers Overall transfer level: Needs assistance Equipment used: Rolling walker (2 wheeled) Transfers: Sit to/from Stand Sit to Stand: Min guard         General transfer comment: attained standing the first try using safe technique  Ambulation/Gait Ambulation/Gait assistance: Min guard Ambulation Distance (Feet): 330 Feet Assistive device: Rolling walker (2 wheeled) Gait Pattern/deviations: Step-through pattern   Gait velocity interpretation: at or above normal speed for age/gender General Gait Details: generally steady, but guarded and slower  Stairs            Wheelchair Mobility    Modified Rankin (Stroke Patients Only)       Balance Overall balance assessment: Needs assistance Sitting-balance support: No upper extremity supported;Feet supported Sitting balance-Leahy Scale: Good      Standing balance support: Bilateral upper extremity supported;During functional activity Standing balance-Leahy Scale: Poor Standing balance comment: reliance on the RW                             Pertinent Vitals/Pain Pain Assessment: No/denies pain    Home Living Family/patient expects to be discharged to:: Skilled nursing facility                 Additional Comments: PTA pt lived at [facility] living, but plans to [facility] to SNF    Prior Function Level of Independence: Needs assistance   Gait / Transfers Assistance Needed: uses walker. walks to dining room.   ADL's / Homemaking Assistance Needed: Assist needed with bathing, housework. Goes to dining room for meals        Hand Dominance   Dominant Hand: Right    Extremity/Trunk Assessment   Upper Extremity Assessment: Defer to OT evaluation           Lower Extremity Assessment: Overall WFL for tasks assessed;RLE deficits/detail RLE Deficits / Details: R knee crepitus, Noticeably weaker, but functional       Communication   Communication: HOH  Cognition Arousal/Alertness: Awake/alert Behavior During Therapy: WFL for tasks assessed/performed Overall Cognitive Status: Within Functional Limits for tasks assessed                      General Comments      Exercises        Assessment/Plan  PT Assessment All further PT needs can be met in the next venue of care  PT Diagnosis Other (comment);Generalized weakness (proximal and truncal weaknesses)   PT Problem List Decreased strength;Decreased activity tolerance;Decreased mobility  PT Treatment Interventions     PT Goals (Current goals can be found in the Care Plan section) Acute Rehab PT Goals Patient Stated Goal: go to rehab before going back to assistive living apartment  PT Goal Formulation: With patient    Frequency     Barriers to discharge        Co-evaluation               End of  Session   Activity Tolerance: Patient tolerated treatment well Patient left: in chair;with call bell/phone within reach Nurse Communication: Mobility status         Time: 8184-0375 PT Time Calculation (min) (ACUTE ONLY): 27 min   Charges:         PT G Codes:        [place], Tessie Fass 11/10/2014, 10:22 AM 11/10/2014  Donnella Sham, PT [phone]  (pager)

## 2014-11-10 NOTE — Progress Notes (Signed)
Patient discharged to snf via stretcher with [facility] at ~1750. Called report to [employer] at [facility] at `@[phone] .

## 2014-11-10 NOTE — Evaluation (Signed)
Occupational Therapy Evaluation Patient Details Name: [name] MRN: [id number] DOB: 05-Sep-1925 Today's Date: 11/10/2014    History of Present Illness 81 y/o woman with hx of CKD diastolic dysfunction who presents with fever, dyspnea, and hypertension. Pt s/p cardiac cath 9/20.    Clinical Impression   Patient presenting with deconditioning secondary to above. Patient mod I>occasional min assist with ADLs PTA. Patient currently functioning at an overall min assist/min guard level with ADLs and functional mobility/transfers. Patient will benefit from acute OT to increase overall independence in the areas of ADLs, functional mobility, and overall safety in order to safely discharge to venue listed below.     Follow Up Recommendations  SNF;Supervision/Assistance - 24 hour    Equipment Recommendations  Other (comment) (TBD next venue of care)    Recommendations for Other Services  None at this time   Precautions / Restrictions Precautions Precautions: Fall Restrictions Weight Bearing Restrictions: No    Mobility Bed Mobility Overal bed mobility: Needs Assistance Bed Mobility: Supine to Sit     Supine to sit: HOB elevated;Supervision     General bed mobility comments: Close supervision for safety. Cues to bring hips forward to get bilateral feet onto floor.   Transfers Overall transfer level: Needs assistance Equipment used: Rolling walker (2 wheeled) Transfers: Sit to/from Stand Sit to Stand: Min guard General transfer comment: Pt min guard for sit<>stands, requring extra time and a few tries before being able to stand all the way up. Cues for hand placement, safety, positioning, and technique    Balance Overall balance assessment: Needs assistance Sitting-balance support: No upper extremity supported;Feet supported Sitting balance-Leahy Scale: Good     Standing balance support: Bilateral upper extremity supported;During functional activity Standing balance-Leahy  Scale: Poor Standing balance comment: Using RW    ADL Overall ADL's : Needs assistance/impaired  General ADL Comments: Pt reports needing assistance with shower transfers PTA and stated that her aid assisted with bathing 2X per week. Pt currently requires up to min assist with ADLs and min guard for functional mobility using RW. Pt takes increased time and will benefit from ongoing OT in [facility] [facility].     Pertinent Vitals/Pain Pain Assessment: No/denies pain     Hand Dominance Right   Extremity/Trunk Assessment Upper Extremity Assessment Upper Extremity Assessment: Generalized weakness   Lower Extremity Assessment Lower Extremity Assessment: Defer to PT evaluation       Communication Communication Communication: HOH   Cognition Arousal/Alertness: Awake/alert Behavior During Therapy: WFL for tasks assessed/performed Overall Cognitive Status: Within Functional Limits for tasks assessed              Home Living Family/patient expects to be discharged to:: Skilled nursing facility Additional Comments: PTA pt lived at [facility] living, but plans to [facility] to SNF    Prior Functioning/Environment Level of Independence: Needs assistance  Gait / Transfers Assistance Needed: uses walker. walks to dining room.  ADL's / Homemaking Assistance Needed: Assist needed with bathing, housework. Goes to dining room for meals Communication / Swallowing Assistance Needed: independent     OT Diagnosis: Generalized weakness   OT Problem List: Decreased strength;Decreased activity tolerance;Impaired balance (sitting and/or standing);Decreased safety awareness;Decreased knowledge of use of DME or AE   OT Treatment/Interventions: Self-care/ADL training;Therapeutic exercise;Energy conservation;DME and/or AE instruction;Patient/family education;Balance training;Therapeutic activities    OT Goals(Current goals can be found in the care plan section) Acute Rehab OT Goals Patient  Stated Goal: go to rehab before going back to assistive  living apartment  OT Goal Formulation: With patient Time For Goal Achievement: 11/24/14 Potential to Achieve Goals: Good ADL Goals Pt Will Perform Grooming: with modified independence;standing Pt Will Transfer to Toilet: with modified independence;ambulating;bedside commode Additional ADL Goal #1: Pt will be able to verbalize at least 3 energy conservation techniques   OT Frequency: Min 2X/week   Barriers to D/C: None known at this time, plan is for pt to d/c>SNF   End of Session Equipment Utilized During Treatment: Gait belt;Rolling walker  Activity Tolerance: Patient tolerated treatment well Patient left: in chair;with call bell/phone within reach   Time: 0833-0900 OT Time Calculation (min): 27 min Charges:  OT General Charges $OT Visit: 1 Procedure OT Evaluation $Initial OT Evaluation Tier I: 1 Procedure OT Treatments $Self Care/Home Management : 8-22 mins  Dusty Wagoner , MS, OTR/L, CLT Pager: [phone]  11/10/2014, 9:13 AM

## 2014-11-10 NOTE — Clinical Social Work Note (Addendum)
CSW requested PT/OT to make this pt a priority today in order for the CSW to start authorization for SNF placement.   Addendum: CSW started insurance authorization. CSW is still need PT's note.   Addendum: CSW informed that Varney Biles has ben assigned to this pt and she is working on [employer].    [place], MSW, [place]

## 2014-11-11 ENCOUNTER — Non-Acute Institutional Stay (SKILLED_NURSING_FACILITY): Payer: Medicare Other | Admitting: Nurse Practitioner

## 2014-11-11 ENCOUNTER — Encounter: Payer: Self-pay | Admitting: Nurse Practitioner

## 2014-11-11 DIAGNOSIS — I10 Essential (primary) hypertension: Secondary | ICD-10-CM

## 2014-11-11 DIAGNOSIS — K219 Gastro-esophageal reflux disease without esophagitis: Secondary | ICD-10-CM

## 2014-11-11 DIAGNOSIS — F411 Generalized anxiety disorder: Secondary | ICD-10-CM | POA: Diagnosis not present

## 2014-11-11 DIAGNOSIS — N393 Stress incontinence (female) (male): Secondary | ICD-10-CM

## 2014-11-11 DIAGNOSIS — D649 Anemia, unspecified: Secondary | ICD-10-CM | POA: Diagnosis not present

## 2014-11-11 NOTE — Assessment & Plan Note (Signed)
Continue B12, Fe

## 2014-11-11 NOTE — Progress Notes (Signed)
Patient ID: [name], female   DOB: 01/20/1926, 81 y.o.   MRN: [id number]  Location:  SNF [facility] Provider:  Marlana Latus NP  Code Status:  Full Code Goals of care: Advanced Directive information    Chief Complaint  Patient presents with  . Medical Management of Chronic Issues  . Hospitalization Follow-up     HPI: Patient is a 81 y.o. female seen in the SNF at [facility] today for evaluation of s/p hospitalization from  11/04/2014 to 11/10/2014 for Acute left heart systolic failure, Sepsis of pulmonary origin Acute pulmonary edema  Acute kidney injury on CKD, III STEMI-anterior wall Ischemic cardiomyopathy Enterococcus species UTI. The patient presented with acute pulmonary edema secondary to anterior wall myocardial infarction and sepsis of pulmonary origin. She also had UTI. She was treated with lasix and IV antibiotics. She and daughter wanted cardiac cath to determine ischemia burden and possible treatment options as she was going to have left breast lumpectomy next month. She has multi-vessel native vessel CAD and if her renal function remains stable she may undergo angioplasty/stent in 1-2 weeks otherwise she will be treated medically.   Review of Systems:  Review of Systems  Constitutional:       Complains of generalized weakness  HENT: Positive for hearing loss. Negative for ear pain.   Eyes: Negative.   Respiratory: Negative.   Cardiovascular: Positive for leg swelling (ankles). Negative for chest pain and palpitations.       Increased edema R+L ankles  Gastrointestinal:       Previous complaints of chronic mild right lower quadrant abdominal pain. History of diverticulosis. Now doing better. She denies any abdominal pain.  Genitourinary: Negative.   Musculoskeletal: Positive for back pain. Negative for myalgias and neck pain.       Flat feet with dropped arches. Pin in the right medial malleolar bones. Chronic back pain. Chronic right shoulder pain. Right knee  pain. Uses 4 wheel walker.  Skin: Negative.   Neurological: Positive for weakness (Generalized).       Complaints of confusion earlier today  Endo/Heme/Allergies:       History of anemia.  Psychiatric/Behavioral: The patient is nervous/anxious.     Past Medical History  Diagnosis Date  . Senile osteoporosis   . Heart murmur   . Small bowel obstruction   . GERD (gastroesophageal reflux disease)   . Hypertension   . Glaucoma   . Arthritis     Osteoarthritis  . Personal history of fall   . Edema   . Hyperlipidemia   . Diverticulosis of colon (without mention of hemorrhage)   . Other malaise and fatigue   . Hypertonicity of bladder   . Osteoarthrosis, unspecified whether generalized or localized, unspecified site   . Lumbago   . Edema   . Pain in joint, pelvic region and thigh   . Pain in joint, shoulder region   . Flat feet   . Cerebral ischemia 12/19/2012    Microvascular   . Cerebral atrophy 12/19/2012  . Cerebral atherosclerosis 12/19/2012  . RLQ abdominal pain 01/17/2011  . Urine, incontinence, stress female 09/18/2014  . CKD (chronic kidney disease) stage 3, GFR 30-59 ml/min 11/03/2014    Patient Active Problem List   Diagnosis Date Noted  . Flash pulmonary edema 11/04/2014  . CKD (chronic kidney disease) stage 3, GFR 30-59 ml/min 11/03/2014  . Breast cancer of upper-outer quadrant of left female breast 10/17/2014  . Urine, incontinence, stress female 09/18/2014  .  Right shoulder pain 03/21/2014  . Mouth sore 03/21/2014  . CVA (cerebral vascular accident) 03/14/2014  . Wedge compression fracture of T6 vertebra 03/02/2014  . Right knee pain 02/06/2014  . Loss of weight 02/03/2014  . Dyspnea 01/16/2014  . Anxiety state 01/16/2014  . Cerebral ischemia 12/19/2012  . Cerebral atrophy 12/19/2012  . Cerebral atherosclerosis 12/19/2012  . Anemia 12/19/2012  . Chronic diastolic congestive heart failure 09/10/2012  . Flat feet   . Lumbago   . Hypertension   . Pain in  joint, shoulder region   . Hypertonicity of bladder   . Edema   . Hyperlipidemia   . GERD (gastroesophageal reflux disease)   . RLQ abdominal pain 01/17/2011    Allergies  Allergen Reactions  . Sulfa Antibiotics Itching    Medications: Patient's Medications  New Prescriptions   No medications on file  Previous Medications   ACETAMINOPHEN (TYLENOL) 325 MG TABLET    Take 2 tablets (650 mg total) by mouth every 6 (six) hours as needed for mild pain (or Fever >/= 101).   ALUM & MAG HYDROXIDE-SIMETH (MAALOX/MYLANTA) [id number] MG/5ML SUSPENSION    Take by mouth. 8ml every 4 hours as needed for indigestion   ATORVASTATIN (LIPITOR) 20 MG TABLET    Take one tablet by mouth once daily for cholesterol   BETIMOL 0.5 % OPHTHALMIC SOLUTION    Place 1 drop into both eyes every morning.    CARVEDILOL (COREG) 3.125 MG TABLET    Take one tablet by mouth twice daily with a meal   CLONIDINE (CATAPRES) 0.1 MG TABLET    Take 0.1 mg by mouth 2 (two) times daily.    FERROUS SULFATE 325 (65 FE) MG TABLET    Take 325 mg by mouth daily with breakfast.   FUROSEMIDE (LASIX) 40 MG TABLET    Take 1 tablet (40 mg total) by mouth daily.   LATANOPROST (XALATAN) 0.005 % OPHTHALMIC SOLUTION    Place 1 drop into both eyes at bedtime.    MIRTAZAPINE (REMERON) 7.5 MG TABLET    TAKE 1 TABLET (7.5 MG TOTAL) BY MOUTH AT BEDTIME. NIGHTLY FOR APPETITE   OMEPRAZOLE (PRILOSEC) 20 MG CAPSULE    Take 20 mg by mouth daily.   OXYBUTYNIN (DITROPAN-XL) 5 MG 24 HR TABLET    Take 5 mg by mouth at bedtime.   POLYVINYL ALCOHOL (LIQUIFILM TEARS) 1.4 % OPHTHALMIC SOLUTION    Place 2 drops into both eyes 2 (two) times daily.   PREDNISOLONE ACETATE (PRED FORTE) 1 % OPHTHALMIC SUSPENSION    Place 1 drop into the right eye 4 (four) times daily. For 5 Days.   VITAMIN B-12 (CYANOCOBALAMIN) 1000 MCG TABLET    Take 1,000 mcg by mouth 2 (two) times daily. Take one tablet by mouth twice daily on Mondays,Wednesdays and Fridays  Modified Medications     No medications on file  Discontinued Medications   No medications on file    Physical Exam: Filed Vitals:   11/12/14 1448  BP: 153/84  Pulse: 74  Temp: 97.2 F (36.2 C)  TempSrc: Tympanic  Resp: 19   There is no weight on file to calculate BMI.  Physical Exam  Constitutional: She is oriented to person, place, and time. She appears well-developed and well-nourished. No distress.  HENT:  Head: Normocephalic and atraumatic.  Right Ear: External ear normal.  Left Ear: External ear normal.  Nose: Nose normal.  Mouth/Throat: Oropharynx is clear and moist. No oropharyngeal exudate.  Eyes: Conjunctivae and  EOM are normal. Pupils are equal, round, and reactive to light. Right eye exhibits no discharge. Left eye exhibits no discharge. No scleral icterus.  Neck: Normal range of motion. Neck supple. No JVD present. No thyromegaly present.  Cardiovascular: Normal rate and regular rhythm.   Murmur heard.  Systolic murmur is present with a grade of 2/6  Pulmonary/Chest: Effort normal. No respiratory distress. She has no wheezes. She has rales (sparse). She exhibits no tenderness.  Abdominal: Soft. Bowel sounds are normal. She exhibits no distension. There is no tenderness. There is no rebound.  Musculoskeletal: Normal range of motion. She exhibits edema (1+  bipedal). She exhibits no tenderness.  R+L ankle edema.  Right knee deformity with osteoarthritic changes.  Lymphadenopathy:    She has no cervical adenopathy.  Neurological: She is alert and oriented to person, place, and time. She has normal reflexes. No cranial nerve deficit. She exhibits normal muscle tone. Coordination normal.  04/21/14 MMSE 28/30. Failed clock drawing. Mild stutter.  Skin: Skin is warm and dry. No rash noted. She is not diaphoretic. No erythema.  Psychiatric: She has a normal mood and affect. Her behavior is normal. Judgment and thought content normal.    Labs reviewed: Basic Metabolic Panel:  Recent Labs   11/06/14 0340 11/08/14 0316 11/09/14 0230 11/10/14 0331  NA 142 138 137 137  K 3.4* 3.3* 4.3 3.8  CL 109 108 109 108  CO2 24 22 21* 21*  GLUCOSE 106* 104* 98 95  BUN 37* 26* 23* 22*  CREATININE 2.62* 2.60* 2.57* 2.69*  CALCIUM 8.2* 8.5* 8.6* 8.6*  MG 2.3 2.2 2.2  --   PHOS 4.3 3.8 4.4 4.3    Liver Function Tests:  Recent Labs  08/17/14 0410  10/06/14 10/19/14 0824 11/04/14 1545  11/08/14 0316 11/09/14 0230 11/10/14 0331  AST 11*  < > 14 15 21   --   --   --   --   ALT 8*  < > 5* 8 13*  --   --   --   --   ALKPHOS 34*  < > 47 48 55  --   --   --   --   BILITOT 0.5  --   --  0.72 0.7  --   --   --   --   PROT 5.9*  --   --  6.3* 7.5  --   --   --   --   ALBUMIN 3.2*  --   --  3.5 4.4  < > 2.9* 2.8* 2.8*  < > = values in this interval not displayed.  CBC:  Recent Labs  11/08/14 0316 11/09/14 0230 11/10/14 0331  WBC 6.2 8.3 7.7  NEUTROABS 3.9 5.8 5.3  HGB 9.9* 9.8* 9.2*  HCT 31.2* 31.1* 29.3*  MCV 92.0 92.3 92.4  PLT 158 146* 134*    Lab Results  Component Value Date   TSH 2.227 08/16/2014   Lab Results  Component Value Date   HGBA1C 5.6 11/06/2014   Lab Results  Component Value Date   CHOL 109 11/06/2014   HDL 35* 11/06/2014   LDLCALC 49 11/06/2014   TRIG 124 11/06/2014   CHOLHDL 3.1 11/06/2014    Significant Diagnostic Results since last visit:    labs: WBC count was 19.4 K, Hgb and platelets count were normal. BUN was 37 and creatinine was 2.30. BNP was 618.3 Urine culture showed over 100,000 colonies of enterococcus species sensitive to all antibiotics tested. Troponin-I peaked  at 12.13  EKG-ST elevation in V1, V2 suggestive of acute anterior wall MI.  Chest x-ray showed pulmonary edema, repeat chest x-ray showed mild improvement.  Echocardiogram showed : - Left ventricle: The cavity size was mildly dilated. There was mild concentric hypertrophy. Systolic function was moderately reduced. The estimated ejection fraction was in the range of 35%  to 40%. There is severe hypokinesis of the entire anteroseptal myocardium; consistent with ischemia. Doppler parameters are consistent with abnormal left ventricular relaxation (grade 1 diastolic dysfunction). - Aortic valve: Transvalvular velocity was increased, due to stenosis. There was mild stenosis. - Mitral valve: Calcified annulus. There was moderate regurgitation. - Left atrium: The atrium was moderately to severely dilated. - Right atrium: The atrium was moderately dilated. - Tricuspid valve: There was severe regurgitation. - Pulmonary arteries: Systolic pressure was mildly to moderately increased. - Pericardium, extracardiac: A trivial pericardial effusion was identified.  Cardiac cath showed :  Mid RCA lesion, 20% stenosed. The lesion was not previously treated.  Mid RCA to Dist RCA lesion, 20% stenosed. The lesion was not previously treated.  Ost Cx lesion, 30% stenosed. The lesion was not previously treated.  Ost 1st Diag lesion, 40% stenosed. The lesion was not previously treated.  1st Diag lesion, 100% stenosed. The lesion was not previously treated.  Lat Ramus lesion, 95% stenosed. The lesion was not previously treated.  Prox LAD-2 lesion, 75% stenosed. The lesion was not previously treated.  Prox LAD-1 lesion, 30% stenosed. The lesion was not previously treated.   Patient Care Team: Estill Dooms, MD as PCP - General (Internal Medicine) Juanita Craver, MD as Consulting Physician (Gastroenterology) Shon Hough, MD as Consulting Physician (Ophthalmology) Thornell Sartorius, MD as Consulting Physician (Otolaryngology) [place] [facility] Otho Darner, NP as Nurse Practitioner (Nurse Practitioner) Bo Merino, MD as Consulting Physician (Rheumatology) Suella Broad, MD as Consulting Physician (Physical Medicine and Rehabilitation) Netta Cedars, MD as Consulting Physician (Orthopedic Surgery) Fanny Skates, MD as Consulting Physician (General  Surgery) Truitt Merle, MD as Consulting Physician (Hematology) Thea Silversmith, MD as Consulting Physician (Radiation Oncology) Rockwell Germany, RN as Registered Nurse Mauro Kaufmann, RN as Registered Nurse Sylvan Cheese, NP as Nurse Practitioner (Nurse Practitioner)  Assessment/Plan Problem List Items Addressed This Visit    Urine, incontinence, stress female (Chronic)    Oxybutynin 5mg  po daily.       Hypertension - Primary (Chronic)    Controlled, takes Carvedilol 3.125mg  bid.       GERD (gastroesophageal reflux disease)    Stable, continue Omeprazole 20mg  daily.       Anxiety state    Stable, cont Mirtazapine 7.5mg  nightly, Clonidine 0.1mg  bid.       Anemia (Chronic)    Continue B12, Fe          Family/ staff Communication: rehab  Labs/tests ordered: none  [facility] Mast NP [facility] [address], [place] [address] On Call:  [phone] & follow prompts after 5pm & weekends Office Phone:  [phone] Office Fax:  [phone]

## 2014-11-11 NOTE — Assessment & Plan Note (Signed)
Oxybutynin 5mg  po daily.

## 2014-11-11 NOTE — Assessment & Plan Note (Signed)
Controlled, takes Carvedilol 3.125mg  bid.

## 2014-11-11 NOTE — Assessment & Plan Note (Signed)
Stable, cont Mirtazapine 7.5mg  nightly, Clonidine 0.1mg  bid.

## 2014-11-11 NOTE — Assessment & Plan Note (Signed)
Stable, continue Omeprazole 20mg daily.  

## 2014-11-14 ENCOUNTER — Encounter: Payer: Self-pay | Admitting: Internal Medicine

## 2014-11-17 ENCOUNTER — Encounter: Payer: Self-pay | Admitting: Internal Medicine

## 2014-11-17 ENCOUNTER — Non-Acute Institutional Stay (SKILLED_NURSING_FACILITY): Payer: Medicare Other | Admitting: Internal Medicine

## 2014-11-17 DIAGNOSIS — C50412 Malignant neoplasm of upper-outer quadrant of left female breast: Secondary | ICD-10-CM | POA: Diagnosis not present

## 2014-11-17 DIAGNOSIS — I10 Essential (primary) hypertension: Secondary | ICD-10-CM | POA: Diagnosis not present

## 2014-11-17 DIAGNOSIS — R0600 Dyspnea, unspecified: Secondary | ICD-10-CM | POA: Diagnosis not present

## 2014-11-17 DIAGNOSIS — D649 Anemia, unspecified: Secondary | ICD-10-CM

## 2014-11-17 DIAGNOSIS — R609 Edema, unspecified: Secondary | ICD-10-CM | POA: Diagnosis not present

## 2014-11-17 DIAGNOSIS — N183 Chronic kidney disease, stage 3 unspecified: Secondary | ICD-10-CM

## 2014-11-17 DIAGNOSIS — I5032 Chronic diastolic (congestive) heart failure: Secondary | ICD-10-CM | POA: Diagnosis not present

## 2014-11-17 DIAGNOSIS — I5021 Acute systolic (congestive) heart failure: Secondary | ICD-10-CM | POA: Diagnosis not present

## 2014-11-17 NOTE — Progress Notes (Signed)
Patient ID: [name], female   DOB: 1925/07/23, 81 y.o.   MRN: [id number]    HISTORY AND PHYSICAL  Location:  [facility] Room Number: N 32 Place of Service: SNF (31)   Extended Emergency Contact Information Primary Emergency Contact: [name],[name] Address: [address]          [place] [address] [place] Phone: [phone] Mobile Phone: [phone] Relation: Daughter Secondary Emergency Contact: [name],[name]  [place] Mobile Phone: [phone] Relation: None  Advanced Directive information Does patient have an advance directive?: Yes (Patient desires a FULL CODE STATUS up to the point indicated in her living will), Type of Advance Directive: Healthcare Power of [place];Living will, Does patient want to make changes to advanced directive?: No - Patient declined  Chief Complaint  Patient presents with  . New Admit To SNF    Following hospitalization    HPI:  Patient in the hospital 11/04/2014 and was discharged 11/10/2014.  At the time of admission she had presented with chest discomfort and dyspnea. She appeared to be septic which ultimately was attributed to a pulmonary problem. She also had UTI with enterococcus. Documentation consistent with ST EMI of the anterior wall was noted. She was diagnosed with ischemic cardiomyopathy. She had pulmonary edema and acute systolic heart failure.   She had heart catheterization on 11/08/2014.   Mid RCA lesion, 20% stenosed. The lesion was not previously treated.  Mid RCA to Dist RCA lesion, 20% stenosed. The lesion was not previously treated.  Ost Cx lesion, 30% stenosed. The lesion was not previously treated.  Ost 1st Diag lesion, 40% stenosed. The lesion was not previously treated.  1st Diag lesion, 100% stenosed. The lesion was not previously treated.  Lat Ramus lesion, 95% stenosed. The lesion was not previously treated.  Prox LAD-2 lesion, 75%  stenosed. The lesion was not previously treated.  Prox LAD-1 lesion, 30% stenosed. The lesion was not previously treated. Mild valvular disease of aortic stenosis, mitral regurgitation and tricuspid regurgitation was noted. Dr. Herb Grays stated patient may need angioplasty/stent in 1-2 weeks of this procedure if her renal function remained stable.  There is a CK-MB stage III. BUN and creatinine have been stable since discharge from the hospital.  Patient had mild delirium during portion of her hospitalization. She seems to be recovered from that now.  Patient has unstable gait and significant deconditioning. She has improved with physical therapy and time.  Patient has a left breast cancer. She was in the process of getting a surgical schedule for procedure done by Dr. Fanny Skates, but this has been postponed due to her recent myocardial infarction.  Past Medical History  Diagnosis Date  . Senile osteoporosis   . Heart murmur   . Small bowel obstruction   . GERD (gastroesophageal reflux disease)   . Hypertension   . Glaucoma   . Arthritis     Osteoarthritis  . Personal history of fall   . Edema   . Hyperlipidemia   . Diverticulosis of colon (without mention of hemorrhage)   . Other malaise and fatigue   . Hypertonicity of bladder   . Osteoarthrosis, unspecified whether generalized or localized, unspecified site   . Lumbago   . Edema   . Pain in joint, pelvic region and thigh   . Pain in joint, shoulder region   . Flat feet   . Cerebral ischemia 12/19/2012    Microvascular   . Cerebral atrophy 12/19/2012  .  Cerebral atherosclerosis 12/19/2012  . RLQ abdominal pain 01/17/2011  . Urine, incontinence, stress female 09/18/2014  . CKD (chronic kidney disease) stage 3, GFR 30-59 ml/min 11/03/2014  . STEMI (ST elevation myocardial infarction) 11/04/2014  . Cancer of left breast August 2016  . Ischemic cardiomyopathy 11/04/2014  . Unstable gait 11/04/2014    Past Surgical History    Procedure Laterality Date  . Tonsillectomy    . Colon surgery  02/1996    Colectomy, partial for diverticular bleed 90%  . Joint replacement Left 12/2000    Left knee; Dr. Wynelle Link  . Breast lumpectomy Bilateral   . Laparoscopic lysis of adhesions  11/15/10    Exploratory  . Cardiac catheterization N/A 11/08/2014    Procedure: Left Heart Cath and Coronary Angiography;  Surgeon: [name], MD;  Location: [facility];  Service: Cardiovascular;  Laterality: N/A;    Patient Care Team: Estill Dooms, MD as PCP - General (Internal Medicine) Juanita Craver, MD as Consulting Physician (Gastroenterology) Shon Hough, MD as Consulting Physician (Ophthalmology) Thornell Sartorius, MD as Consulting Physician (Otolaryngology) [place] Man Otho Darner, NP as Nurse Practitioner (Nurse Practitioner) Bo Merino, MD as Consulting Physician (Rheumatology) Suella Broad, MD as Consulting Physician (Physical Medicine and Rehabilitation) Netta Cedars, MD as Consulting Physician (Orthopedic Surgery) Fanny Skates, MD as Consulting Physician (General Surgery) Truitt Merle, MD as Consulting Physician (Hematology) Thea Silversmith, MD as Consulting Physician (Radiation Oncology) Rockwell Germany, RN as Registered Nurse Mauro Kaufmann, RN as Registered Nurse Sylvan Cheese, NP as Nurse Practitioner (Nurse Practitioner) [name], MD as Consulting Physician (Cardiology)  Social History   Social History  . Marital Status: Widowed    Spouse Name: N/A  . Number of Children: N/A  . Years of Education: N/A   Occupational History  . Not on file.   Social History Main Topics  . Smoking status: Former Smoker -- 0.50 packs/day for 30 years    Types: Cigarettes    Quit date: 12/16/1980  . Smokeless tobacco: Never Used  . Alcohol Use: 0.6 oz/week    1 Glasses of wine per week     Comment: occasional glass of wine  . Drug Use: No  . Sexual Activity: No   Other Topics  Concern  . Not on file   Social History Narrative   Lives at [facility] 05/2011   Widowed 2003   Living Will   [place] with cane   Never smoked    Exercise none     reports that she quit smoking about 33 years ago. Her smoking use included Cigarettes. She has a 15 pack-year smoking history. She has never used smokeless tobacco. She reports that she drinks about 0.6 oz of alcohol per week. She reports that she does not use illicit drugs.  Family History  Problem Relation Age of Onset  . Cancer Father 2    colon  . Cancer Maternal Aunt     breast cancer   . Cancer Cousin     breast cancer    Family Status  Relation Status Death Age  . Father Deceased 39    Cause of Death: Colon cancer  . Mother Deceased 62    Cause of Death: Unknown  . Brother Alive   . Daughter Alive     Immunization History  Administered Date(s) Administered  . Influenza Whole 11/19/2011, 12/02/2012  . Influenza-Unspecified 12/06/2013  . Pneumococcal Polysaccharide-23 02/18/2009    Allergies  Allergen Reactions  .  Sulfa Antibiotics Itching    Medications: Patient's Medications  New Prescriptions   No medications on file  Previous Medications   ACETAMINOPHEN (TYLENOL) 325 MG TABLET    Take 2 tablets (650 mg total) by mouth every 6 (six) hours as needed for mild pain (or Fever >/= 101).   ALUM & MAG HYDROXIDE-SIMETH (MAALOX/MYLANTA) 200-200-20 MG/5ML SUSPENSION    Take by mouth. 78ml every 4 hours as needed for indigestion   ATORVASTATIN (LIPITOR) 20 MG TABLET    Take one tablet by mouth once daily for cholesterol   BETIMOL 0.5 % OPHTHALMIC SOLUTION    Place 1 drop into both eyes every morning.    CARVEDILOL (COREG) 3.125 MG TABLET    Take one tablet by mouth twice daily with a meal   CLONIDINE (CATAPRES) 0.1 MG TABLET    Take 0.1 mg by mouth 2 (two) times daily.    FERROUS SULFATE 325 (65 FE) MG TABLET    Take 325 mg by mouth daily with breakfast.   FUROSEMIDE (LASIX) 40 MG TABLET     Take 1 tablet (40 mg total) by mouth daily.   LATANOPROST (XALATAN) 0.005 % OPHTHALMIC SOLUTION    Place 1 drop into both eyes at bedtime.    MIRTAZAPINE (REMERON) 7.5 MG TABLET    TAKE 1 TABLET (7.5 MG TOTAL) BY MOUTH AT BEDTIME. NIGHTLY FOR APPETITE   OMEPRAZOLE (PRILOSEC) 20 MG CAPSULE    Take 20 mg by mouth daily.   OXYBUTYNIN (DITROPAN-XL) 5 MG 24 HR TABLET    Take 5 mg by mouth at bedtime.   POLYVINYL ALCOHOL (LIQUIFILM TEARS) 1.4 % OPHTHALMIC SOLUTION    Place 2 drops into both eyes 2 (two) times daily.   PREDNISOLONE ACETATE (PRED FORTE) 1 % OPHTHALMIC SUSPENSION    Place 1 drop into the right eye 4 (four) times daily. For 5 Days.   VITAMIN B-12 (CYANOCOBALAMIN) 1000 MCG TABLET    Take 1,000 mcg by mouth 2 (two) times daily. Take one tablet by mouth twice daily on Mondays,Wednesdays and Fridays  Modified Medications   No medications on file  Discontinued Medications   No medications on file    Review of Systems  Constitutional:       Complains of generalized weakness  HENT: Positive for hearing loss. Negative for ear pain.   Eyes: Negative.   Respiratory: Negative.  Negative for cough and shortness of breath.   Cardiovascular: Negative for chest pain, palpitations and leg swelling.       Recent hospitalization for ST EMI. Has mild valvular heart disease of the aortic valve, mitral regurgitation, and tricuspid regurgitation. Cardiomegaly is noted on x-rays. She had acute left systolic heart function diminished due to the STEMI. Diagnosed with ischemic cardiomyopathy.  Gastrointestinal:       Previous complaints of chronic mild right lower quadrant abdominal pain. History of diverticulosis. Now doing better. She denies any abdominal pain.  Endocrine: Negative.   Genitourinary: Negative.   Musculoskeletal: Positive for back pain and arthralgias (right knee. Left shoulder.). Negative for myalgias, neck pain and neck stiffness.       Flat feet with dropped arches. Pin in the right  medial malleolar bones. Chronic back pain. Chronic right shoulder pain. Right knee pain. Uses 4 wheel walker.  Skin: Negative.   Neurological: Positive for weakness (Generalized). Negative for tremors, syncope, speech difficulty, light-headedness, numbness and headaches.  Hematological:       History of anemia.  Psychiatric/Behavioral: The patient is nervous/anxious.  Filed Vitals:   11/17/14 1035  BP: 105/60  Pulse: 70  Temp: 99 F (37.2 C)  Resp: 20  Height: $Remove'5\' 5"'ZoSdzlr$  (1.651 m)  Weight: 130 lb 11.2 oz (59.285 kg)   Body mass index is 21.75 kg/(m^2).  Physical Exam  Constitutional: She is oriented to person, place, and time. She appears well-developed and well-nourished. No distress.  HENT:  Head: Normocephalic and atraumatic.  Right Ear: External ear normal.  Left Ear: External ear normal.  Nose: Nose normal.  Mouth/Throat: Oropharynx is clear and moist. No oropharyngeal exudate.  Eyes: Conjunctivae and EOM are normal. Pupils are equal, round, and reactive to light. Right eye exhibits no discharge. Left eye exhibits no discharge. No scleral icterus.  Neck: Normal range of motion. Neck supple. No JVD present. No thyromegaly present.  Cardiovascular: Normal rate and regular rhythm.   Murmur heard.  Systolic murmur is present with a grade of 2/6  Pulmonary/Chest: Effort normal. No respiratory distress. She has no wheezes. She has rales (sparse). She exhibits no tenderness.  Abdominal: Soft. Bowel sounds are normal. She exhibits no distension. There is no tenderness. There is no rebound.  Musculoskeletal: Normal range of motion. She exhibits no edema or tenderness.  Right knee deformity with osteoarthritic changes. Unstable gait. Using walker.  Lymphadenopathy:    She has no cervical adenopathy.  Neurological: She is alert and oriented to person, place, and time. She has normal reflexes. No cranial nerve deficit. She exhibits normal muscle tone. Coordination normal.  04/21/14  MMSE 28/30. Failed clock drawing. Mild stutter.  Skin: Skin is warm and dry. No rash noted. She is not diaphoretic. No erythema.  Psychiatric: She has a normal mood and affect. Her behavior is normal. Judgment and thought content normal.    Labs reviewed: Lab Summary Latest Ref Rng 11/10/2014 11/09/2014 11/08/2014  Hemoglobin 12.0 - 15.0 g/dL 9.2(L) 9.8(L) 9.9(L)  Hematocrit 36.0 - 46.0 % 29.3(L) 31.1(L) 31.2(L)  White count 4.0 - 10.5 K/uL 7.7 8.3 6.2  Platelet count 150 - 400 K/uL 134(L) 146(L) 158  Sodium 135 - 145 mmol/L 137 137 138  Potassium 3.5 - 5.1 mmol/L 3.8 4.3 3.3(L)  Calcium 8.9 - 10.3 mg/dL 8.6(L) 8.6(L) 8.5(L)  Phosphorus 2.5 - 4.6 mg/dL 4.3 4.4 3.8  Creatinine 0.44 - 1.00 mg/dL 2.69(H) 2.57(H) 2.60(H)  AST - (None) (None) (None)  Alk Phos - (None) (None) (None)  Bilirubin - (None) (None) (None)  Glucose 65 - 99 mg/dL 95 98 104(H)  Cholesterol - (None) (None) (None)  HDL cholesterol - (None) (None) (None)  Triglycerides - (None) (None) (None)  LDL Direct - (None) (None) (None)  LDL Calc - (None) (None) (None)  Total protein - (None) (None) (None)  Albumin 3.5 - 5.0 g/dL 2.8(L) 2.8(L) 2.9(L)   Lab Results  Component Value Date   BUN 22* 11/10/2014   Lab Results  Component Value Date   HGBA1C 5.6 11/06/2014   Lab Results  Component Value Date   TSH 2.227 08/16/2014          Dg Chest 2 View  11/10/2014   CLINICAL DATA:  81 year old female with a history of shortness of breath  EXAM: CHEST - 2 VIEW  COMPARISON:  11/04/2014, 08/16/2014  FINDINGS: Cardiomediastinal silhouette unchanged in size and contour. Decreasing pulmonary vascular congestion. Atherosclerotic calcifications of the aortic arch.  No pneumothorax.  Improving interstitial opacities and interlobular septal thickening from the prior.  Blunting of the bilateral costophrenic angle, as well as the costophrenic sulcus on  the lateral view.  Small bilateral rounded nodular densities at the bases of the  lungs, most likely partially calcified granulomas or potentially pleural plaques.  No displaced fracture. Degenerative changes of the shoulders. Degenerative spine changes. Compression fracture of mid thoracic vertebral body, unchanged from study from June  Unremarkable appearance of the upper abdomen.  IMPRESSION: Improving CHF with small bilateral pleural effusions.  Atherosclerosis  Signed,  Dulcy Fanny. Earleen Newport, DO  [facility]  [facility]   Electronically Signed   By: Corrie Mckusick D.O.   On: 11/10/2014 08:14   Dg Chest Port 1 View  11/04/2014   CLINICAL DATA:  Fever, cough.  EXAM: PORTABLE CHEST - 1 VIEW  COMPARISON:  August 16, 2014.  FINDINGS: Stable cardiomediastinal silhouette. Stable mild central pulmonary vascular congestion is noted. No pneumothorax or pleural effusion is noted. Degenerative changes seen involving both glenohumeral joints. Bilateral perihilar edema is noted.  IMPRESSION: Stable cardiomegaly. Mild central pulmonary vascular congestion is noted with probable bilateral perihilar edema.   Electronically Signed   By: Marijo Conception, M.D.   On: 11/04/2014 16:45     Assessment/Plan 1. Acute systolic congestive heart failure Much improved. Continue current medications.  2. Chronic diastolic congestive heart failure Significantly improved. Continue current medications.  3. CKD (chronic kidney disease) stage 3, GFR 30-59 ml/min Stable  4. Dyspnea Improved  5. Edema Resolved   6. Breast cancer of upper-outer quadrant of left female breast Observe for now.  7. Anemia, unspecified anemia type Remains on ferrous sulfate  8. Essential hypertension Much slower than in the past. Losartan was discontinued, I presume due to lower blood pressures and renal insufficiency.  Patient is currently participating in physical therapy due to her deconditioning. She is made significant improvement since admission to the skilled nursing  facility 11/10/2014. She thinks she is ready to return to her apartment. She has appointment with her cardiologist today. Because there are hospital notes that suggest he may wish to do more procedures, I think she should stay here until all procedures have been completed.

## 2014-11-21 LAB — HEPATIC FUNCTION PANEL
ALT: 5 U/L — AB (ref 7–35)
AST: 13 U/L (ref 13–35)
Alkaline Phosphatase: 47 U/L (ref 25–125)
Bilirubin, Total: 0.1 mg/dL

## 2014-11-21 LAB — BASIC METABOLIC PANEL
BUN: 30 mg/dL — AB (ref 4–21)
CREATININE: 2.8 mg/dL — AB (ref 0.5–1.1)
Glucose: 97 mg/dL
Potassium: 3.4 mmol/L (ref 3.4–5.3)
Sodium: 143 mmol/L (ref 137–147)

## 2014-11-21 LAB — CBC AND DIFFERENTIAL
HCT: 33 % — AB (ref 36–46)
Hemoglobin: 11 g/dL — AB (ref 12.0–16.0)
PLATELETS: 217 10*3/uL (ref 150–399)
WBC: 8.8 10*3/mL

## 2014-11-22 ENCOUNTER — Other Ambulatory Visit: Payer: Self-pay | Admitting: Nurse Practitioner

## 2014-11-22 DIAGNOSIS — N183 Chronic kidney disease, stage 3 unspecified: Secondary | ICD-10-CM

## 2014-11-29 LAB — BASIC METABOLIC PANEL
BUN: 32 mg/dL — AB (ref 4–21)
CREATININE: 2.5 mg/dL — AB (ref 0.5–1.1)
GLUCOSE: 90 mg/dL
POTASSIUM: 3.5 mmol/L (ref 3.4–5.3)
Sodium: 142 mmol/L (ref 137–147)

## 2014-12-01 ENCOUNTER — Other Ambulatory Visit: Payer: Self-pay | Admitting: Nurse Practitioner

## 2014-12-01 DIAGNOSIS — N183 Chronic kidney disease, stage 3 unspecified: Secondary | ICD-10-CM

## 2014-12-15 ENCOUNTER — Non-Acute Institutional Stay: Payer: Medicare Other | Admitting: Nurse Practitioner

## 2014-12-15 ENCOUNTER — Encounter: Payer: Self-pay | Admitting: Nurse Practitioner

## 2014-12-15 DIAGNOSIS — N318 Other neuromuscular dysfunction of bladder: Secondary | ICD-10-CM | POA: Diagnosis not present

## 2014-12-15 DIAGNOSIS — I10 Essential (primary) hypertension: Secondary | ICD-10-CM

## 2014-12-15 DIAGNOSIS — K219 Gastro-esophageal reflux disease without esophagitis: Secondary | ICD-10-CM

## 2014-12-15 DIAGNOSIS — I5021 Acute systolic (congestive) heart failure: Secondary | ICD-10-CM | POA: Diagnosis not present

## 2014-12-15 DIAGNOSIS — R609 Edema, unspecified: Secondary | ICD-10-CM

## 2014-12-15 DIAGNOSIS — N390 Urinary tract infection, site not specified: Secondary | ICD-10-CM

## 2014-12-15 DIAGNOSIS — D631 Anemia in chronic kidney disease: Secondary | ICD-10-CM

## 2014-12-15 DIAGNOSIS — N189 Chronic kidney disease, unspecified: Secondary | ICD-10-CM

## 2014-12-15 DIAGNOSIS — F411 Generalized anxiety disorder: Secondary | ICD-10-CM

## 2014-12-15 DIAGNOSIS — N183 Chronic kidney disease, stage 3 unspecified: Secondary | ICD-10-CM

## 2014-12-15 NOTE — Assessment & Plan Note (Signed)
12/08/14 Hgb 10.9, continue Vit B12, Fe

## 2014-12-15 NOTE — Assessment & Plan Note (Signed)
Compensated clinically on Furosemide 40mg , no apparent edema BLE, lungs are clear, persisted elevated creat 2.8 12/08/14

## 2014-12-15 NOTE — Assessment & Plan Note (Signed)
12/17/14 Bun 27, creatinine 2.8, K 3.2, adding K 40meq po daily, update BMP in one week.

## 2014-12-15 NOTE — Assessment & Plan Note (Addendum)
Stable, cont Mirtazapine 7.5mg nightly  

## 2014-12-15 NOTE — Assessment & Plan Note (Signed)
Permissive blood pressure control, continue Carvedilol 3.125mg  bid, Clonidine 0.1mg  bid, Imdur 15mg , Furosemide 40mg 

## 2014-12-15 NOTE — Assessment & Plan Note (Signed)
12/10/14 urine culture K. Pneumoniae 12/11/14 started 7 day course of Cipro 250mg  bid

## 2014-12-15 NOTE — Assessment & Plan Note (Signed)
Manageable, continue Ditropan 5mg  daily.

## 2014-12-15 NOTE — Progress Notes (Signed)
Patient ID: [name], female   DOB: 04-07-1925, 81 y.o.   MRN: [id number]  Location:  [facility] Provider:  Marlana Latus NP  Code Status:  Full Code Goals of care: Advanced Directive information    Chief Complaint  Patient presents with  . Medical Management of Chronic Issues  . Acute Visit    UTI, K 3.2     HPI: Patient is a 81 y.o. female seen in the [facility] at [facility] today for evaluation of UTI, urine culture 12/10/14 showed K. Pneumoniae 7 day course Cipro 250mg  bid started 12/11/14, tolerated, CHF compensated clinically while on Furosemide, low K 3.2 12/08/14, CKD worsening creat 2.80 12/08/14. Pending breast lumpectomy, pending cardiac clearance.  Review of Systems  Constitutional: Negative for fever, chills and diaphoresis.  HENT: Positive for hearing loss. Negative for congestion and ear pain.   Eyes: Negative.  Negative for discharge and redness.  Respiratory: Negative.  Negative for cough and shortness of breath.   Cardiovascular: Negative for chest pain, palpitations and leg swelling (ankles).       Resolved edema BLE  Gastrointestinal: Negative for heartburn, nausea, vomiting, abdominal pain, diarrhea and constipation.       History of diverticulosis. She denies any abdominal pain.  Genitourinary: Positive for frequency. Negative for dysuria, urgency and flank pain.  Musculoskeletal: Positive for back pain. Negative for myalgias and neck pain.       Flat feet with dropped arches. Pin in the right medial malleolar bones. Chronic back pain. Chronic right shoulder pain. Right knee pain. Uses 4 wheel walker.  Skin: Negative.  Negative for itching and rash.  Neurological: Negative for dizziness, tingling, tremors, sensory change, speech change, focal weakness and weakness (Generalized).  Endo/Heme/Allergies:       History of anemia.  Psychiatric/Behavioral: Negative for depression. The patient is not nervous/anxious.     Past Medical History  Diagnosis Date    . Senile osteoporosis   . Heart murmur   . Small bowel obstruction ([place])   . GERD (gastroesophageal reflux disease)   . Hypertension   . Glaucoma   . Arthritis     Osteoarthritis  . Personal history of fall   . Edema   . Hyperlipidemia   . Diverticulosis of colon (without mention of hemorrhage)   . Other malaise and fatigue   . Hypertonicity of bladder   . Osteoarthrosis, unspecified whether generalized or localized, unspecified site   . Lumbago   . Edema   . Pain in joint, pelvic region and thigh   . Pain in joint, shoulder region   . Flat feet   . Cerebral ischemia 12/19/2012    Microvascular   . Cerebral atrophy 12/19/2012  . Cerebral atherosclerosis 12/19/2012  . RLQ abdominal pain 01/17/2011  . Urine, incontinence, stress female 09/18/2014  . CKD (chronic kidney disease) stage 3, GFR 30-59 ml/min 11/03/2014  . STEMI (ST elevation myocardial infarction) ([place]) 11/04/2014  . Cancer of left breast [facility]) August 2016  . Ischemic cardiomyopathy 11/04/2014  . Unstable gait 11/04/2014    Patient Active Problem List   Diagnosis Date Noted  . UTI (urinary tract infection) 12/15/2014  . Acute systolic congestive heart failure ([place]) 11/17/2014  . CKD (chronic kidney disease) stage 3, GFR 30-59 ml/min 11/03/2014  . Breast cancer of upper-outer quadrant of left female breast ([place]) 10/17/2014  . Urine, incontinence, stress female 09/18/2014  . Right shoulder pain 03/21/2014  . Mouth sore 03/21/2014  . CVA (cerebral vascular accident) (  [place]) 03/14/2014  . Wedge compression fracture of T6 vertebra ([place]) 03/02/2014  . Right knee pain 02/06/2014  . Loss of weight 02/03/2014  . Dyspnea 01/16/2014  . Anxiety state 01/16/2014  . Cerebral ischemia 12/19/2012  . Cerebral atrophy 12/19/2012  . Cerebral atherosclerosis 12/19/2012  . Anemia 12/19/2012  . Chronic diastolic congestive heart failure ([place]) 09/10/2012  . Flat feet   . Lumbago   . Hypertension   . Pain in joint, shoulder  region   . Hypertonicity of bladder   . Edema   . Hyperlipidemia   . GERD (gastroesophageal reflux disease)   . RLQ abdominal pain 01/17/2011    Allergies  Allergen Reactions  . Sulfa Antibiotics Itching    Medications: Patient's Medications  New Prescriptions   No medications on file  Previous Medications   ACETAMINOPHEN (TYLENOL) 325 MG TABLET    Take 2 tablets (650 mg total) by mouth every 6 (six) hours as needed for mild pain (or Fever >/= 101).   ALUM & MAG HYDROXIDE-SIMETH (MAALOX/MYLANTA) [id number] MG/5ML SUSPENSION    Take by mouth. 52ml every 4 hours as needed for indigestion   ATORVASTATIN (LIPITOR) 20 MG TABLET    Take one tablet by mouth once daily for cholesterol   BETIMOL 0.5 % OPHTHALMIC SOLUTION    Place 1 drop into both eyes every morning.    CARVEDILOL (COREG) 3.125 MG TABLET    Take one tablet by mouth twice daily with a meal   CLONIDINE (CATAPRES) 0.1 MG TABLET    Take 0.1 mg by mouth 2 (two) times daily.    FERROUS SULFATE 325 (65 FE) MG TABLET    Take 325 mg by mouth daily with breakfast.   FUROSEMIDE (LASIX) 40 MG TABLET    Take 1 tablet (40 mg total) by mouth daily.   LATANOPROST (XALATAN) 0.005 % OPHTHALMIC SOLUTION    Place 1 drop into both eyes at bedtime.    MIRTAZAPINE (REMERON) 7.5 MG TABLET    TAKE 1 TABLET (7.5 MG TOTAL) BY MOUTH AT BEDTIME. NIGHTLY FOR APPETITE   OMEPRAZOLE (PRILOSEC) 20 MG CAPSULE    Take 20 mg by mouth daily.   OXYBUTYNIN (DITROPAN-XL) 5 MG 24 HR TABLET    Take 5 mg by mouth at bedtime.   POLYVINYL ALCOHOL (LIQUIFILM TEARS) 1.4 % OPHTHALMIC SOLUTION    Place 2 drops into both eyes 2 (two) times daily.   PREDNISOLONE ACETATE (PRED FORTE) 1 % OPHTHALMIC SUSPENSION    Place 1 drop into the right eye 4 (four) times daily. For 5 Days.   VITAMIN B-12 (CYANOCOBALAMIN) 1000 MCG TABLET    Take 1,000 mcg by mouth 2 (two) times daily. Take one tablet by mouth twice daily on Mondays,Wednesdays and Fridays  Modified Medications   No  medications on file  Discontinued Medications   No medications on file    Physical Exam: Filed Vitals:   12/15/14 1120  BP: 144/71  Pulse: 78  Temp: 99.5 F (37.5 C)  TempSrc: Tympanic  Resp: 18   There is no weight on file to calculate BMI.  Physical Exam  Constitutional: She is oriented to person, place, and time. She appears well-developed and well-nourished. No distress.  HENT:  Head: Normocephalic and atraumatic.  Right Ear: External ear normal.  Left Ear: External ear normal.  Nose: Nose normal.  Mouth/Throat: Oropharynx is clear and moist. No oropharyngeal exudate.  Eyes: Conjunctivae and EOM are normal. Pupils are equal, round, and reactive to light. Right eye  exhibits no discharge. Left eye exhibits no discharge. No scleral icterus.  Neck: Normal range of motion. Neck supple. No JVD present. No thyromegaly present.  Cardiovascular: Normal rate and regular rhythm.   Murmur heard.  Systolic murmur is present with a grade of 2/6  Pulmonary/Chest: Effort normal. No respiratory distress. She has no wheezes. She has rales (sparse). She exhibits no tenderness.  Abdominal: Soft. Bowel sounds are normal. She exhibits no distension. There is no tenderness. There is no rebound.  Musculoskeletal: Normal range of motion. She exhibits no edema (1+  bipedal) or tenderness.  Right knee deformity with osteoarthritic changes.  Lymphadenopathy:    She has no cervical adenopathy.  Neurological: She is alert and oriented to person, place, and time. She has normal reflexes. No cranial nerve deficit. She exhibits normal muscle tone. Coordination normal.  04/21/14 MMSE 28/30. Failed clock drawing.   Skin: Skin is warm and dry. No rash noted. She is not diaphoretic. No erythema.  Psychiatric: She has a normal mood and affect. Her behavior is normal. Judgment and thought content normal.    Labs reviewed: Basic Metabolic Panel:  Recent Labs  11/06/14 0340 11/08/14 0316 11/09/14 0230  11/10/14 0331 11/21/14 11/29/14  NA 142 138 137 137 143 142  K 3.4* 3.3* 4.3 3.8 3.4 3.5  CL 109 108 109 108  --   --   CO2 24 22 21* 21*  --   --   GLUCOSE 106* 104* 98 95  --   --   BUN 37* 26* 23* 22* 30* 32*  CREATININE 2.62* 2.60* 2.57* 2.69* 2.8* 2.5*  CALCIUM 8.2* 8.5* 8.6* 8.6*  --   --   MG 2.3 2.2 2.2  --   --   --   PHOS 4.3 3.8 4.4 4.3  --   --     Liver Function Tests:  Recent Labs  08/17/14 0410  10/19/14 0824 11/04/14 1545  11/08/14 0316 11/09/14 0230 11/10/14 0331 11/21/14  AST 11*  < > 15 21  --   --   --   --  13  ALT 8*  < > 8 13*  --   --   --   --  5*  ALKPHOS 34*  < > 48 55  --   --   --   --  47  BILITOT 0.5  --  0.72 0.7  --   --   --   --   --   PROT 5.9*  --  6.3* 7.5  --   --   --   --   --   ALBUMIN 3.2*  --  3.5 4.4  < > 2.9* 2.8* 2.8*  --   < > = values in this interval not displayed.  CBC:  Recent Labs  11/08/14 0316 11/09/14 0230 11/10/14 0331 11/21/14  WBC 6.2 8.3 7.7 8.8  NEUTROABS 3.9 5.8 5.3  --   HGB 9.9* 9.8* 9.2* 11.0*  HCT 31.2* 31.1* 29.3* 33*  MCV 92.0 92.3 92.4  --   PLT 158 146* 134* 217    Lab Results  Component Value Date   TSH 2.227 08/16/2014   Lab Results  Component Value Date   HGBA1C 5.6 11/06/2014   Lab Results  Component Value Date   CHOL 109 11/06/2014   HDL 35* 11/06/2014   LDLCALC 49 11/06/2014   TRIG 124 11/06/2014   CHOLHDL 3.1 11/06/2014    Significant Diagnostic Results since last visit:  labs: WBC count was 19.4 K, Hgb and platelets count were normal. BUN was 37 and creatinine was 2.30. BNP was 618.3 Urine culture showed over 100,000 colonies of enterococcus species sensitive to all antibiotics tested. Troponin-I peaked at 12.13  EKG-ST elevation in V1, V2 suggestive of acute anterior wall MI.  Chest x-ray showed pulmonary edema, repeat chest x-ray showed mild improvement.  Echocardiogram showed : - Left ventricle: The cavity size was mildly dilated. There was mild concentric  hypertrophy. Systolic function was moderately reduced. The estimated ejection fraction was in the range of 35% to 40%. There is severe hypokinesis of the entire anteroseptal myocardium; consistent with ischemia. Doppler parameters are consistent with abnormal left ventricular relaxation (grade 1 diastolic dysfunction). - Aortic valve: Transvalvular velocity was increased, due to stenosis. There was mild stenosis. - Mitral valve: Calcified annulus. There was moderate regurgitation. - Left atrium: The atrium was moderately to severely dilated. - Right atrium: The atrium was moderately dilated. - Tricuspid valve: There was severe regurgitation. - Pulmonary arteries: Systolic pressure was mildly to moderately increased. - Pericardium, extracardiac: A trivial pericardial effusion was identified.  Cardiac cath showed :  Mid RCA lesion, 20% stenosed. The lesion was not previously treated.  Mid RCA to Dist RCA lesion, 20% stenosed. The lesion was not previously treated.  Ost Cx lesion, 30% stenosed. The lesion was not previously treated.  Ost 1st Diag lesion, 40% stenosed. The lesion was not previously treated.  1st Diag lesion, 100% stenosed. The lesion was not previously treated.  Lat Ramus lesion, 95% stenosed. The lesion was not previously treated.  Prox LAD-2 lesion, 75% stenosed. The lesion was not previously treated.  Prox LAD-1 lesion, 30% stenosed. The lesion was not previously treated.   Patient Care Team: Estill Dooms, MD as PCP - General (Internal Medicine) Juanita Craver, MD as Consulting Physician (Gastroenterology) Shon Hough, MD as Consulting Physician (Ophthalmology) Thornell Sartorius, MD as Consulting Physician (Otolaryngology) [place] Jora Galluzzo Otho Darner, NP as Nurse Practitioner (Nurse Practitioner) Bo Merino, MD as Consulting Physician (Rheumatology) Suella Broad, MD as Consulting Physician (Physical Medicine and Rehabilitation) Netta Cedars, MD as Consulting Physician (Orthopedic Surgery) Fanny Skates, MD as Consulting Physician (General Surgery) Truitt Merle, MD as Consulting Physician (Hematology) Thea Silversmith, MD as Consulting Physician (Radiation Oncology) Rockwell Germany, RN as Registered Nurse Mauro Kaufmann, RN as Registered Nurse Sylvan Cheese, NP as Nurse Practitioner (Nurse Practitioner) [name], MD as Consulting Physician (Cardiology)  Assessment/Plan Problem List Items Addressed This Visit    Acute systolic congestive heart failure [facility] - Primary    Compensated clinically on Furosemide 40mg , no apparent edema BLE, lungs are clear, persisted elevated creat 2.8 12/08/14      Anemia (Chronic)    12/08/14 Hgb 10.9, continue Vit B12, Fe      Anxiety state    Stable, cont Mirtazapine 7.5mg  nightly      CKD (chronic kidney disease) stage 3, GFR 30-59 ml/min (Chronic)    12/17/14 Bun 27, creatinine 2.8, K 3.2, adding K 9meq po daily, update BMP in one week.       Edema (Chronic)    Not apparent BLE, continue Furosemide 40mg  daily.       GERD (gastroesophageal reflux disease)    Stable, continue Omeprazole 20mg  daily.       Hypertension (Chronic)    Permissive blood pressure control, continue Carvedilol 3.125mg  bid, Clonidine 0.1mg  bid, Imdur 15mg , Furosemide 40mg       Hypertonicity of  bladder    Manageable, continue Ditropan 5mg  daily.       UTI (urinary tract infection)    12/10/14 urine culture K. Pneumoniae 12/11/14 started 7 day course of Cipro 250mg  bid           Family/ staff Communication: observe the patient.   Labs/tests ordered: CBC, BMP, urine culture done 12/10/14, update BMP  [facility] Leoma Folds NP [facility] [address], [place] [address] On Call:  [phone] & follow prompts after 5pm & weekends Office Phone:  [phone] Office Fax:  [phone]

## 2014-12-15 NOTE — Assessment & Plan Note (Signed)
Stable, continue Omeprazole 20mg daily.  

## 2014-12-15 NOTE — Assessment & Plan Note (Signed)
Not apparent BLE, continue Furosemide 40mg  daily.

## 2014-12-22 LAB — BASIC METABOLIC PANEL
BUN: 22 mg/dL — AB (ref 4–21)
CREATININE: 2.6 mg/dL — AB (ref 0.5–1.1)
GLUCOSE: 84 mg/dL
POTASSIUM: 3.7 mmol/L (ref 3.4–5.3)
Sodium: 140 mmol/L (ref 137–147)

## 2014-12-29 ENCOUNTER — Other Ambulatory Visit: Payer: Self-pay | Admitting: Nurse Practitioner

## 2014-12-29 DIAGNOSIS — N183 Chronic kidney disease, stage 3 unspecified: Secondary | ICD-10-CM

## 2015-02-17 ENCOUNTER — Other Ambulatory Visit: Payer: Self-pay | Admitting: General Surgery

## 2015-02-17 DIAGNOSIS — C50912 Malignant neoplasm of unspecified site of left female breast: Secondary | ICD-10-CM

## 2015-03-07 ENCOUNTER — Ambulatory Visit (HOSPITAL_COMMUNITY)
Admission: RE | Admit: 2015-03-07 | Discharge: 2015-03-07 | Disposition: A | Discharge status: Home / Self Care | Payer: Medicare Other | Source: Ambulatory Visit | Attending: General Surgery | Admitting: General Surgery

## 2015-03-07 ENCOUNTER — Encounter (HOSPITAL_COMMUNITY)
Admission: RE | Admit: 2015-03-07 | Discharge: 2015-03-07 | Disposition: A | Discharge status: Home / Self Care | Payer: Medicare Other | Source: Ambulatory Visit | Attending: General Surgery | Admitting: General Surgery

## 2015-03-07 ENCOUNTER — Encounter (HOSPITAL_COMMUNITY): Payer: Self-pay

## 2015-03-07 DIAGNOSIS — I517 Cardiomegaly: Secondary | ICD-10-CM | POA: Insufficient documentation

## 2015-03-07 DIAGNOSIS — Z01818 Encounter for other preprocedural examination: Secondary | ICD-10-CM | POA: Insufficient documentation

## 2015-03-07 DIAGNOSIS — M19011 Primary osteoarthritis, right shoulder: Secondary | ICD-10-CM | POA: Insufficient documentation

## 2015-03-07 DIAGNOSIS — J449 Chronic obstructive pulmonary disease, unspecified: Secondary | ICD-10-CM | POA: Diagnosis not present

## 2015-03-07 DIAGNOSIS — M19012 Primary osteoarthritis, left shoulder: Secondary | ICD-10-CM | POA: Diagnosis not present

## 2015-03-07 HISTORY — DX: Cough: R05

## 2015-03-07 HISTORY — DX: Cough, unspecified: R05.9

## 2015-03-07 HISTORY — DX: Unspecified dementia, unspecified severity, without behavioral disturbance, psychotic disturbance, mood disturbance, and anxiety: F03.90

## 2015-03-07 LAB — COMPREHENSIVE METABOLIC PANEL
ALBUMIN: 3.8 g/dL (ref 3.5–5.0)
ALT: 7 U/L — AB (ref 14–54)
AST: 16 U/L (ref 15–41)
Alkaline Phosphatase: 44 U/L (ref 38–126)
Anion gap: 13 (ref 5–15)
BUN: 24 mg/dL — ABNORMAL HIGH (ref 6–20)
CHLORIDE: 106 mmol/L (ref 101–111)
CO2: 24 mmol/L (ref 22–32)
CREATININE: 2.66 mg/dL — AB (ref 0.44–1.00)
Calcium: 9.5 mg/dL (ref 8.9–10.3)
GFR calc non Af Amer: 15 mL/min — ABNORMAL LOW (ref >60)
GFR, EST AFRICAN AMERICAN: 17 mL/min — AB (ref >60)
GLUCOSE: 105 mg/dL — AB (ref 65–99)
Potassium: 4.2 mmol/L (ref 3.5–5.1)
SODIUM: 143 mmol/L (ref 135–145)
Total Bilirubin: 0.7 mg/dL (ref 0.3–1.2)
Total Protein: 6.7 g/dL (ref 6.5–8.1)

## 2015-03-07 LAB — CBC WITH DIFFERENTIAL/PLATELET
BASOS ABS: 0 10*3/uL (ref 0.0–0.1)
BASOS PCT: 0 %
EOS ABS: 0.1 10*3/uL (ref 0.0–0.7)
EOS PCT: 2 %
HCT: 35.9 % — ABNORMAL LOW (ref 36.0–46.0)
HEMOGLOBIN: 11.7 g/dL — AB (ref 12.0–15.0)
Lymphocytes Relative: 28 %
Lymphs Abs: 1.6 10*3/uL (ref 0.7–4.0)
MCH: 30.2 pg (ref 26.0–34.0)
MCHC: 32.6 g/dL (ref 30.0–36.0)
MCV: 92.8 fL (ref 78.0–100.0)
Monocytes Absolute: 0.7 10*3/uL (ref 0.1–1.0)
Monocytes Relative: 13 %
NEUTROS PCT: 57 %
Neutro Abs: 3.3 10*3/uL (ref 1.7–7.7)
PLATELETS: 183 10*3/uL (ref 150–400)
RBC: 3.87 MIL/uL (ref 3.87–5.11)
RDW: 15.3 % (ref 11.5–15.5)
WBC: 5.6 10*3/uL (ref 4.0–10.5)

## 2015-03-07 NOTE — Pre-Procedure Instructions (Signed)
    [name]  03/07/2015      [id number] - [place], [place] [address] [place] [address] Phone: [phone] Fax: [phone]  [id number] [address], [place] [address] Phone: [phone] Fax: [phone]  [place], [place] [address] Phone: [phone] Fax: [phone]    Your procedure is scheduled on 03/15/15.  Report to [facility] Admitting at 630 A.M.  Call this number if you have problems the morning of surgery:  [phone]   Remember:  Do not eat food or drink liquids after midnight.  Take these medicines the morning of surgery with A SIP OF WATER --tylenol,carvedilol,clondine,isosorbide,prilosec   Do not wear jewelry, make-up or nail polish.  Do not wear lotions, powders, or perfumes.  You may wear deodorant.  Do not shave 48 hours prior to surgery.  Men may shave face and neck.  Do not bring valuables to the hospital.  [facility] is not responsible for any belongings or valuables.  Contacts, dentures or bridgework may not be worn into surgery.  Leave your suitcase in the car.  After surgery it may be brought to your room.  For patients admitted to the hospital, discharge time will be determined by your treatment team.  Patients discharged the day of surgery will not be allowed to drive home.   Name and phone number of your driver:   Special instructions:    Please read over the following fact sheets that you were given. Pain Booklet, Coughing and Deep Breathing and Surgical Site Infection Prevention

## 2015-03-08 NOTE — Progress Notes (Addendum)
Anesthesia Chart Review:  Pt is 82 year old female scheduled for L breast lumpectomy with radioactive seed localization on 03/15/2015 with Dr. Dalbert Batman.   PCP is Dr. Jeanmarie Hubert. Cardiologist is Dr. Doylene Canard.   PMH includes:  STEMI (10/2014), ischemic cardiomyopathy, HTN, glaucoma, heart murmur, hyperlipidemia, CKD (stage III), breast cancer, GERD. Former smoker. BMI 22.   Medications include: lipitor, carvedilol, clonidine, iron, lasix, imdur, potassium, prilosec.  Preoperative labs reviewed.  Cr 2.66, BUN 24. This is consistent with prior results.   Chest x-ray 11/10/14 reviewed. Improving CHF with small bilateral pleural effusions. Atherosclerosis  EKG 10/1714: NSR has replaced Sinus tachycardia. Prolonged QT new. ST elevation in V1V2 with T inversion concerning for ischemia new   Cardiac cath 11/08/14:   Mid RCA lesion, 20% stenosed.   Mid RCA to Dist RCA lesion, 20% stenosed.   Ost Cx lesion, 30% stenosed.   Ost 1st Diag lesion, 40% stenosed.   1st Diag lesion, 100% stenosed.   Lat Ramus lesion, 95% stenosed.   Prox LAD-2 lesion, 75% stenosed.   Prox LAD-1 lesion, 30% stenosed.   Echo 11/05/14:  - Left ventricle: The cavity size was mildly dilated. There was mild concentric hypertrophy. Systolic function was moderatelyreduced. The estimated ejection fraction was in the range of 35% to 40%. There is severe hypokinesis of the entire anteroseptal myocardium; consistent with ischemia. Doppler parameters are consistent with abnormal left ventricular relaxation (grade 1 diastolic dysfunction). - Aortic valve: Transvalvular velocity was increased, due to stenosis. There was mild stenosis. - Mitral valve: Calcified annulus. There was moderate regurgitation. - Left atrium: The atrium was moderately to severely dilated. - Right atrium: The atrium was moderately dilated. - Tricuspid valve: There was severe regurgitation. - Pulmonary arteries: Systolic pressure was mildly to moderately  increased. - Pericardium, extracardiac: A trivial pericardial effusion was identified.  Awaiting notes and clearance from Dr. Merrilee Jansky office.   Willeen Cass, FNP-BC [facility] Short Stay Surgical Center/Anesthesiology Phone: [phone] 03/08/2015 12:20 PM  Addendum:  Pt saw Dr. Doylene Canard 03/14/15, and was cleared for this surgery.   If no changes, I anticipate pt can proceed with surgery as scheduled.   Willeen Cass, FNP-BC [facility] Short Stay Surgical Center/Anesthesiology Phone: [phone] 03/14/2015 2:37 PM

## 2015-03-14 ENCOUNTER — Encounter (HOSPITAL_COMMUNITY): Payer: Self-pay | Admitting: Anesthesiology

## 2015-03-14 DIAGNOSIS — N183 Chronic kidney disease, stage 3 (moderate): Secondary | ICD-10-CM | POA: Diagnosis not present

## 2015-03-14 DIAGNOSIS — I25110 Atherosclerotic heart disease of native coronary artery with unstable angina pectoris: Secondary | ICD-10-CM | POA: Diagnosis not present

## 2015-03-14 DIAGNOSIS — I5022 Chronic systolic (congestive) heart failure: Secondary | ICD-10-CM | POA: Diagnosis not present

## 2015-03-14 DIAGNOSIS — D500 Iron deficiency anemia secondary to blood loss (chronic): Secondary | ICD-10-CM | POA: Diagnosis not present

## 2015-03-14 MED ORDER — CHLORHEXIDINE GLUCONATE 4 % EX LIQD: 1.0000 "application " | Freq: Once | CUTANEOUS | Status: DC

## 2015-03-14 MED ORDER — CEFAZOLIN SODIUM-DEXTROSE 2-3 GM-% IV SOLR
2.0000 g | INTRAVENOUS | Status: AC
  Administered 2015-03-15: 2 g via INTRAVENOUS
  Filled 2015-03-14: qty 50

## 2015-03-14 NOTE — Anesthesia Preprocedure Evaluation (Addendum)
Anesthesia Evaluation  Patient identified by MRN, date of birth, ID band Patient awake    Reviewed: Allergy & Precautions, NPO status , Patient's Chart, lab work & pertinent test results, reviewed documented beta blocker date and time   Airway Mallampati: III  TM Distance: >3 FB Neck ROM: Full    Dental no notable dental hx. (+) Teeth Intact   Pulmonary shortness of breath, with exertion and at rest, former smoker,    Pulmonary exam normal breath sounds clear to auscultation       Cardiovascular hypertension, Pt. on medications and Pt. on home beta blockers + Past MI, + Peripheral Vascular Disease and +CHF  Normal cardiovascular exam+ Valvular Problems/Murmurs  Rhythm:Regular Rate:Bradycardia + Systolic murmurs Chronic Systolic and diastolic heart failure LVEF 35-40% with concentric LVH and anteroseptal hypokinesis, mild AS, severe TR STEMI 10/2014 Cath 11/08/14-    Mid RCA lesion, 20% stenosed.      Mid RCA to Dist RCA lesion, 20% stenosed.      Ost Cx lesion, 30% stenosed.      Ost 1st Diag lesion, 40% stenosed.      1st Diag lesion, 100% stenosed.      Lat Ramus lesion, 95% stenosed.      Prox LAD-2 lesion, 75% stenosed.      Prox LAD-1 lesion, 30% stenosed. Cerebral atherosclerosis    Neuro/Psych PSYCHIATRIC DISORDERS Anxiety Glaucoma  Neuromuscular disease CVA    GI/Hepatic Neg liver ROS, GERD  Medicated and Controlled,Diverticular disease   Endo/Other  Hyperlipidemia Ca of left breast  Renal/GU Renal InsufficiencyRenal disease Bladder dysfunction  Urinary incontinence Hypertonic Bladder    Musculoskeletal  (+) Arthritis , Osteoarthritis,    Abdominal   Peds  Hematology  (+) anemia ,   Anesthesia Other Findings   Reproductive/Obstetrics                      Anesthesia Physical Anesthesia Plan  ASA: IV  Anesthesia Plan: General   Post-op Pain Management:     Induction: Intravenous  Airway Management Planned: LMA  Additional Equipment: Arterial line  Intra-op Plan:   Post-operative Plan: Extubation in OR  Informed Consent: I have reviewed the patients History and Physical, chart, labs and discussed the procedure including the risks, benefits and alternatives for the proposed anesthesia with the patient or authorized representative who has indicated his/her understanding and acceptance.   Dental advisory given  Plan Discussed with: CRNA, Anesthesiologist and Surgeon  Anesthesia Plan Comments: (Etomidate for induction)      Anesthesia Quick Evaluation

## 2015-03-14 NOTE — H&P (Signed)
[name]  Location: [facility] Patient #: [id number] DOB: 03-25-1925 Widowed / Language: Cleophus Molt / Race: White Female      History of Present Illness  The patient is a 82 year old female who presents with breast cancer. This patient returns for discussion of management options for her left breast cancer. Dr. Elder Negus is her PCP. Dr. Christene Slates is her imaging specialist. Dr. Burr Medico and Dr. Pablo Ledger have seen her in the breast clinic. Dr. Doylene Canard is her cardiologist.  She was initially seen in the [facility] on October 19, 2014. 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 She has chronic kidney disease with creatinine 2.4, hypertension, chronic diastolic congestive heart failure controlled, GERD. She had a stroke in January 2016 a little bit stable but alert and oriented and ambulates with a walker. Lives in assisted living at a friend's home [place].  Dr. Pablo Ledger states that she is not a candidate for radiation therapy. Dr. Burr Medico states that she is not a candidate for anti-antigen therapy because of her stroke. She knows that her options are lumpectomy or close clinical observation. Patient her daughter are motivated towards lumpectomy.  In the interim, she was hospitalized on September 19 with acute anterior wall myocardial infarction and acute left heart systolic failure. She underwent cardiac catheterization. She stabilized and has gone home and is doing well. Ambulates independently with a walker and takes her meals of the dining hall. I have a letter from Dr. Doylene Canard and he states that she has remained stable and may undergo low risk surgery 90 days post event. He said she is stable on current medications with no changes. This is essentially a cardiac clearance. Increased risk is understood by all.  I had a long talk with the patient and her daughter. We talked about the risk of observation alone  with the possibility of cancer progression, with the uncertainty of rate of progression. We talked about the risk of surgery with extending her stroke or having another myocardial infarction or even death. We talked about this for a long time. The patient stated multiple times she does not want to live with cancer in her body and is willing to take that the risk. I think this is reasonable to since this is what she wants to do and she is not a candidate for antiestrogen therapy.  We discussed the indications, details, techniques, and numerous risk of left breast lumpectomy with radioactive seed localization. She is aware of the risk of bleeding, infection, deformity, reoperation for positive margins, nerve damage with chronic pain as well as the cardiovascular and pulmonary risk, which is increased in her case. She understands all these issues well. Has good insight. All of her questions are answered. Both patient and her daughter agree with this plan.   Allergies  Sulfa 10 *OPHTHALMIC AGENTS*  Medication History  Omeprazole (20MG  Capsule DR, Oral) Active. No Current Medications (Taken starting 02/17/2015) Betimol (0.5% Solution, Ophthalmic) Active. Ferrous Sulfate (325 (65 Fe)MG Tablet, Oral) Active. Furosemide (40MG  Tablet, Oral) Active. Klor-Con M10 (10MEQ Tablet ER, Oral) Active. Coreg (3.125MG  Tablet, Oral) Active. CloNIDine HCl (0.1MG  Tablet, Oral) Active. Lipitor (20MG  Tablet, Oral) Active. Co Q 10 (100MG  Capsule, Oral) Active. Isosorbide Mononitrate (30MG  Tablet ER, Oral) Active. Latanoprost (0.005% Solution, Ophthalmic) Active. Mirtazapine (7.5MG  Tablet, Oral) Active. Oxybutynin Chloride (5MG  Tablet, Oral) Active. Tylenol (325MG  Tablet, Oral) Active. Mintox (200-200-20MG /5ML Suspension, Oral) Active. Medications Reconciled  Vitals  Weight Measurement Declined  Height Measurement Declined Temp.: 97.70F(Temporal)  Pulse: 90 (Regular)  BP: 124/70  (Sitting, Left Arm, Standard)     Physical Exam  General Mental Status-Alert. General Appearance-Not in acute distress. Build & Nutrition-Well nourished. Posture-Normal posture. Note: Endplates independently with a walker. Oriented   Head and Neck Head-normocephalic, atraumatic with no lesions or palpable masses. Trachea-midline. Thyroid Gland Characteristics - normal size and consistency and no palpable nodules.  Chest and Lung Exam Chest and lung exam reveals -on auscultation, normal breath sounds, no adventitious sounds and normal vocal resonance.  Breast Note: Tiny lump left breast 12 o'clock position. Suspect tiny resolving hematoma. No other masses. No other skin changes. No axillary adenopathy.   Cardiovascular Note: Regular rate and rhythm. Grade 2 systolic murmur. Intact radial pulses   Abdomen Inspection Inspection of the abdomen reveals - No Hernias. Palpation/Percussion Palpation and Percussion of the abdomen reveal - Soft, Non Tender, No Rigidity (guarding), No hepatosplenomegaly and No Palpable abdominal masses.  Neurologic Neurologic evaluation reveals -alert and oriented x 3 with no impairment of recent or remote memory, normal attention span and ability to concentrate, normal sensation and normal coordination.  Musculoskeletal Normal Exam - Bilateral-Upper Extremity Strength Normal and Lower Extremity Strength Normal.    Assessment & Plan   1)   HISTORY OF CEREBROVASCULAR ACCIDENT (123XX123) 2)   DIASTOLIC CONGESTIVE HEART FAILURE, NYHA CLASS 2 (I50.30) 3)   HISTORY OF MYOCARDIAL INFARCTION (I25.2) 4)   HYPERTENSION, BENIGN (I10) 5)   CHRONIC GERD (K21.9) 6)   CKD (CHRONIC KIDNEY DISEASE), STAGE II (N18.2) 7)   ANEMIA, DEFICIENCY (D53.9) 8)   CANCER OF LEFT BREAST, STAGE 0 (D05.92) Current Plans You are being scheduled for surgery - Our schedulers will call you.    You have recovered from your myocardial infarction, and  your cardiologist states that meet we may proceed with low risk surgery 3 months after the myocardial infarction.. You have an untreated in situ cancer of the left breast which is high grade. We have discussed your options of observation alone versus lumpectomy alone. We have had a long discussion about the risk of cancer progression with observation as well as the risk of stroke or myocardial infarction under anesthesia. You have stated that you would not like to live with cancer in your body and would like to proceed with lumpectomy. You will be scheduled for left breast lumpectomy with radioactive seed localization. Most likely you will return to friend's home [place] in the skilled nursing postop, although if you are not feeling well we can always keep you in the hospital overnight We have had a discussion about the risk of bleeding, infection, deformity, reoperative surgery for positive margins, nerve damage with chronic pain.

## 2015-03-15 ENCOUNTER — Ambulatory Visit (HOSPITAL_COMMUNITY): Payer: Medicare Other | Admitting: Vascular Surgery

## 2015-03-15 ENCOUNTER — Ambulatory Visit (HOSPITAL_COMMUNITY): Payer: Medicare Other | Admitting: Anesthesiology

## 2015-03-15 ENCOUNTER — Encounter (HOSPITAL_COMMUNITY)
Admission: RE | Disposition: A | Discharge status: Home / Self Care | Payer: Self-pay | Source: Ambulatory Visit | Attending: General Surgery

## 2015-03-15 ENCOUNTER — Ambulatory Visit (HOSPITAL_COMMUNITY)
Admission: RE | Admit: 2015-03-15 | Discharge: 2015-03-15 | Disposition: A | Discharge status: Home / Self Care | Payer: Medicare Other | Source: Ambulatory Visit | Attending: General Surgery | Admitting: General Surgery

## 2015-03-15 ENCOUNTER — Encounter (HOSPITAL_COMMUNITY): Payer: Self-pay | Admitting: *Deleted

## 2015-03-15 DIAGNOSIS — K219 Gastro-esophageal reflux disease without esophagitis: Secondary | ICD-10-CM | POA: Insufficient documentation

## 2015-03-15 DIAGNOSIS — Z79899 Other long term (current) drug therapy: Secondary | ICD-10-CM | POA: Diagnosis not present

## 2015-03-15 DIAGNOSIS — M1990 Unspecified osteoarthritis, unspecified site: Secondary | ICD-10-CM | POA: Diagnosis not present

## 2015-03-15 DIAGNOSIS — E785 Hyperlipidemia, unspecified: Secondary | ICD-10-CM | POA: Diagnosis not present

## 2015-03-15 DIAGNOSIS — I130 Hypertensive heart and chronic kidney disease with heart failure and stage 1 through stage 4 chronic kidney disease, or unspecified chronic kidney disease: Secondary | ICD-10-CM | POA: Diagnosis not present

## 2015-03-15 DIAGNOSIS — N182 Chronic kidney disease, stage 2 (mild): Secondary | ICD-10-CM | POA: Diagnosis not present

## 2015-03-15 DIAGNOSIS — D539 Nutritional anemia, unspecified: Secondary | ICD-10-CM | POA: Insufficient documentation

## 2015-03-15 DIAGNOSIS — Z8673 Personal history of transient ischemic attack (TIA), and cerebral infarction without residual deficits: Secondary | ICD-10-CM | POA: Insufficient documentation

## 2015-03-15 DIAGNOSIS — Z87891 Personal history of nicotine dependence: Secondary | ICD-10-CM | POA: Insufficient documentation

## 2015-03-15 DIAGNOSIS — I252 Old myocardial infarction: Secondary | ICD-10-CM | POA: Diagnosis not present

## 2015-03-15 DIAGNOSIS — I739 Peripheral vascular disease, unspecified: Secondary | ICD-10-CM | POA: Diagnosis not present

## 2015-03-15 DIAGNOSIS — C50412 Malignant neoplasm of upper-outer quadrant of left female breast: Secondary | ICD-10-CM

## 2015-03-15 DIAGNOSIS — D0512 Intraductal carcinoma in situ of left breast: Secondary | ICD-10-CM | POA: Diagnosis present

## 2015-03-15 DIAGNOSIS — I5042 Chronic combined systolic (congestive) and diastolic (congestive) heart failure: Secondary | ICD-10-CM | POA: Diagnosis not present

## 2015-03-15 DIAGNOSIS — N62 Hypertrophy of breast: Secondary | ICD-10-CM | POA: Insufficient documentation

## 2015-03-15 HISTORY — PX: BREAST LUMPECTOMY WITH RADIOACTIVE SEED LOCALIZATION: SHX6424

## 2015-03-15 SURGERY — BREAST LUMPECTOMY WITH RADIOACTIVE SEED LOCALIZATION
Anesthesia: General | Site: Breast | Laterality: Left

## 2015-03-15 MED ORDER — FENTANYL CITRATE (PF) 250 MCG/5ML IJ SOLN
INTRAMUSCULAR | Status: AC
  Filled 2015-03-15: qty 5

## 2015-03-15 MED ORDER — FENTANYL CITRATE (PF) 100 MCG/2ML IJ SOLN
INTRAMUSCULAR | Status: AC
  Filled 2015-03-15: qty 2

## 2015-03-15 MED ORDER — ONDANSETRON HCL 4 MG/2ML IJ SOLN
INTRAMUSCULAR | Status: DC | PRN
  Administered 2015-03-15: 4 mg via INTRAVENOUS

## 2015-03-15 MED ORDER — BUPIVACAINE-EPINEPHRINE (PF) 0.25% -1:200000 IJ SOLN
INTRAMUSCULAR | Status: AC
  Filled 2015-03-15: qty 30

## 2015-03-15 MED ORDER — LIDOCAINE HCL (CARDIAC) 20 MG/ML IV SOLN
INTRAVENOUS | Status: AC
  Filled 2015-03-15: qty 5

## 2015-03-15 MED ORDER — 0.9 % SODIUM CHLORIDE (POUR BTL) OPTIME
TOPICAL | Status: DC | PRN
  Administered 2015-03-15: 1000 mL

## 2015-03-15 MED ORDER — ONDANSETRON HCL 4 MG/2ML IJ SOLN
INTRAMUSCULAR | Status: AC
  Filled 2015-03-15: qty 2

## 2015-03-15 MED ORDER — MEPERIDINE HCL 25 MG/ML IJ SOLN: 6.2500 mg | INTRAMUSCULAR | Status: DC | PRN

## 2015-03-15 MED ORDER — PROPOFOL 10 MG/ML IV BOLUS
INTRAVENOUS | Status: AC
  Filled 2015-03-15: qty 20

## 2015-03-15 MED ORDER — HYDROCODONE-ACETAMINOPHEN 5-325 MG PO TABS: 1.0000 | ORAL_TABLET | Freq: Four times a day (QID) | ORAL | Status: DC | PRN

## 2015-03-15 MED ORDER — LIDOCAINE HCL (CARDIAC) 20 MG/ML IV SOLN
INTRAVENOUS | Status: DC | PRN
  Administered 2015-03-15: 80 mg via INTRAVENOUS

## 2015-03-15 MED ORDER — DEXAMETHASONE SODIUM PHOSPHATE 4 MG/ML IJ SOLN
INTRAMUSCULAR | Status: DC | PRN
  Administered 2015-03-15: 4 mg via INTRAVENOUS

## 2015-03-15 MED ORDER — DEXAMETHASONE SODIUM PHOSPHATE 4 MG/ML IJ SOLN
INTRAMUSCULAR | Status: AC
  Filled 2015-03-15: qty 1

## 2015-03-15 MED ORDER — BUPIVACAINE-EPINEPHRINE 0.25% -1:200000 IJ SOLN
INTRAMUSCULAR | Status: DC | PRN
  Administered 2015-03-15: 10 mL

## 2015-03-15 MED ORDER — METOCLOPRAMIDE HCL 5 MG/ML IJ SOLN: 10.0000 mg | Freq: Once | INTRAMUSCULAR | Status: DC | PRN

## 2015-03-15 MED ORDER — FENTANYL CITRATE (PF) 100 MCG/2ML IJ SOLN
INTRAMUSCULAR | Status: DC | PRN
  Administered 2015-03-15: 50 ug via INTRAVENOUS
  Administered 2015-03-15: 25 ug via INTRAVENOUS

## 2015-03-15 MED ORDER — SUCCINYLCHOLINE CHLORIDE 20 MG/ML IJ SOLN
INTRAMUSCULAR | Status: DC | PRN
  Administered 2015-03-15: 20 mg via INTRAVENOUS

## 2015-03-15 MED ORDER — PHENYLEPHRINE HCL 10 MG/ML IJ SOLN
INTRAMUSCULAR | Status: DC | PRN
  Administered 2015-03-15: 80 ug via INTRAVENOUS
  Administered 2015-03-15: 40 ug via INTRAVENOUS

## 2015-03-15 MED ORDER — PHENYLEPHRINE 40 MCG/ML (10ML) SYRINGE FOR IV PUSH (FOR BLOOD PRESSURE SUPPORT)
PREFILLED_SYRINGE | INTRAVENOUS | Status: AC
  Filled 2015-03-15: qty 10

## 2015-03-15 MED ORDER — LACTATED RINGERS IV SOLN
INTRAVENOUS | Status: DC | PRN
  Administered 2015-03-15: 08:00:00 via INTRAVENOUS

## 2015-03-15 MED ORDER — ETOMIDATE 2 MG/ML IV SOLN
INTRAVENOUS | Status: DC | PRN
  Administered 2015-03-15: 16 mg via INTRAVENOUS

## 2015-03-15 MED ORDER — FENTANYL CITRATE (PF) 100 MCG/2ML IJ SOLN
25.0000 ug | INTRAMUSCULAR | Status: DC | PRN
  Administered 2015-03-15 (×2): 50 ug via INTRAVENOUS

## 2015-03-15 SURGICAL SUPPLY — 52 items
ADH SKN CLS APL DERMABOND .7 (GAUZE/BANDAGES/DRESSINGS) ×1
ADH SKN CLS LQ APL DERMABOND (GAUZE/BANDAGES/DRESSINGS) ×1
APPLIER CLIP 9.375 MED OPEN (MISCELLANEOUS)
APR CLP MED 9.3 20 MLT OPN (MISCELLANEOUS)
BINDER BREAST LRG (GAUZE/BANDAGES/DRESSINGS) IMPLANT
BINDER BREAST MEDIUM (GAUZE/BANDAGES/DRESSINGS) ×1 IMPLANT
BINDER BREAST XLRG (GAUZE/BANDAGES/DRESSINGS) IMPLANT
BLADE SURG 15 STRL LF DISP TIS (BLADE) ×1 IMPLANT
BLADE SURG 15 STRL SS (BLADE) ×2
CANISTER SUCTION 2500CC (MISCELLANEOUS) ×2 IMPLANT
CHLORAPREP W/TINT 26ML (MISCELLANEOUS) ×2 IMPLANT
CLIP APPLIE 9.375 MED OPEN (MISCELLANEOUS) IMPLANT
COVER PROBE W GEL 5X96 (DRAPES) ×2 IMPLANT
COVER SURGICAL LIGHT HANDLE (MISCELLANEOUS) ×2 IMPLANT
DERMABOND ADHESIVE PROPEN (GAUZE/BANDAGES/DRESSINGS) ×1
DERMABOND ADVANCED (GAUZE/BANDAGES/DRESSINGS) ×1
DERMABOND ADVANCED .7 DNX12 (GAUZE/BANDAGES/DRESSINGS) ×1 IMPLANT
DERMABOND ADVANCED .7 DNX6 (GAUZE/BANDAGES/DRESSINGS) IMPLANT
DEVICE DUBIN SPECIMEN MAMMOGRA (MISCELLANEOUS) ×2 IMPLANT
DRAPE CHEST BREAST 15X10 FENES (DRAPES) ×2 IMPLANT
DRAPE UTILITY XL STRL (DRAPES) ×2 IMPLANT
DRSG PAD ABDOMINAL 8X10 ST (GAUZE/BANDAGES/DRESSINGS) ×2 IMPLANT
ELECT CAUTERY BLADE 6.4 (BLADE) ×2 IMPLANT
ELECT REM PT RETURN 9FT ADLT (ELECTROSURGICAL) ×2
ELECTRODE REM PT RTRN 9FT ADLT (ELECTROSURGICAL) ×1 IMPLANT
GLOVE BIOGEL PI IND STRL 7.0 (GLOVE) IMPLANT
GLOVE BIOGEL PI INDICATOR 7.0 (GLOVE) ×2
GLOVE EUDERMIC 7 POWDERFREE (GLOVE) ×2 IMPLANT
GLOVE SURG SS PI 6.5 STRL IVOR (GLOVE) ×1 IMPLANT
GOWN STRL REUS W/ TWL LRG LVL3 (GOWN DISPOSABLE) ×1 IMPLANT
GOWN STRL REUS W/ TWL XL LVL3 (GOWN DISPOSABLE) ×1 IMPLANT
GOWN STRL REUS W/TWL LRG LVL3 (GOWN DISPOSABLE) ×2
GOWN STRL REUS W/TWL XL LVL3 (GOWN DISPOSABLE) ×2
KIT BASIN OR (CUSTOM PROCEDURE TRAY) ×2 IMPLANT
KIT MARKER MARGIN INK (KITS) ×2 IMPLANT
NDL HYPO 25X1 1.5 SAFETY (NEEDLE) ×1 IMPLANT
NEEDLE HYPO 25X1 1.5 SAFETY (NEEDLE) ×2 IMPLANT
NS IRRIG 1000ML POUR BTL (IV SOLUTION) IMPLANT
PACK SURGICAL SETUP 50X90 (CUSTOM PROCEDURE TRAY) ×2 IMPLANT
PAD ABD 8X10 STRL (GAUZE/BANDAGES/DRESSINGS) ×1 IMPLANT
PENCIL BUTTON HOLSTER BLD 10FT (ELECTRODE) ×2 IMPLANT
SPONGE GAUZE 4X4 12PLY STER LF (GAUZE/BANDAGES/DRESSINGS) ×1 IMPLANT
SPONGE LAP 18X18 X RAY DECT (DISPOSABLE) ×2 IMPLANT
SUT MNCRL AB 4-0 PS2 18 (SUTURE) ×2 IMPLANT
SUT SILK 2 0 SH (SUTURE) ×2 IMPLANT
SUT VIC AB 3-0 SH 18 (SUTURE) ×2 IMPLANT
SYR BULB 3OZ (MISCELLANEOUS) ×2 IMPLANT
SYR CONTROL 10ML LL (SYRINGE) ×2 IMPLANT
TOWEL OR 17X24 6PK STRL BLUE (TOWEL DISPOSABLE) ×2 IMPLANT
TOWEL OR 17X26 10 PK STRL BLUE (TOWEL DISPOSABLE) ×2 IMPLANT
TUBE CONNECTING 12X1/4 (SUCTIONS) ×2 IMPLANT
YANKAUER SUCT BULB TIP NO VENT (SUCTIONS) ×2 IMPLANT

## 2015-03-15 NOTE — Interval H&P Note (Signed)
History and Physical Interval Note:  03/15/2015 8:00 AM  [name]  has presented today for surgery, with the diagnosis of left breast cancer  The various methods of treatment have been discussed with the patient and family. After consideration of risks, benefits and other options for treatment, the patient has consented to  Procedure(s): LEFT BREAST LUMPECTOMY WITH RADIOACTIVE SEED LOCALIZATION (Left) as a surgical intervention .  [name] history has been reviewed, patient examined, no change in status, stable for surgery.  I have reviewed the patient's chart and labs.  Questions were answered to the patient's satisfaction.     Adin Hector

## 2015-03-15 NOTE — Anesthesia Postprocedure Evaluation (Signed)
Anesthesia Post Note  Patient: LONNETTE BONADIO  Procedure(s) Performed: Procedure(s) (LRB): LEFT BREAST LUMPECTOMY WITH RADIOACTIVE SEED LOCALIZATION (Left)  Patient location during evaluation: PACU Anesthesia Type: General Level of consciousness: awake and alert and oriented Pain management: pain level controlled Vital Signs Assessment: post-procedure vital signs reviewed and stable Respiratory status: spontaneous breathing, nonlabored ventilation and respiratory function stable Cardiovascular status: blood pressure returned to baseline and stable Postop Assessment: no signs of nausea or vomiting Anesthetic complications: no    Last Vitals:  Filed Vitals:   03/15/15 1100 03/15/15 1118  BP: 199/88 182/93  Pulse: 71 74  Temp:    Resp: 16 18    Last Pain:  Filed Vitals:   03/15/15 1119  PainSc: 2                  Frederich Montilla A.

## 2015-03-15 NOTE — Transfer of Care (Signed)
Immediate Anesthesia Transfer of Care Note  Patient: [name]  Procedure(s) Performed: Procedure(s): LEFT BREAST LUMPECTOMY WITH RADIOACTIVE SEED LOCALIZATION (Left)  Patient Location: PACU  Anesthesia Type:General  Level of Consciousness: awake, alert  and oriented  Airway & Oxygen Therapy: Patient Spontanous Breathing and Patient connected to face mask oxygen  Post-op Assessment: Report given to RN, Post -op Vital signs reviewed and stable and Patient moving all extremities X 4  Post vital signs: Reviewed and stable  Last Vitals:  Filed Vitals:   03/15/15 0741  BP: 189/73  Pulse: 55  Temp: 36.1 C  Resp: 18    Complications: No apparent anesthesia complications

## 2015-03-15 NOTE — Op Note (Signed)
Patient Name:           [name]   Date of Surgery:        03/15/2015  Pre op Diagnosis:      High-grade ductal carcinoma in situ left breast, 12:00 position  Post op Diagnosis:    Same  Procedure:                 Left breast lumpectomy with radioactive seed localization and margin assessment  Surgeon:                     Edsel Petrin. Dalbert Batman, M.D., FACS  Assistant:                      OR staff  Operative Indications:   . This 82 y.o. patient returns for discussion of management options for her left breast cancer. Dr. Elder Negus is her PCP. Dr. Christene Slates is her imaging specialist. Dr. Burr Medico and Dr. Pablo Ledger have seen her in the breast clinic. Dr. Doylene Canard is her cardiologist.      She was initially seen in the [facility] on October 19, 2014. 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 She has chronic kidney disease with creatinine 2.4, hypertension, chronic diastolic congestive heart failure controlled, GERD. She had a stroke in January 2016 a little bit stable but alert and oriented and ambulates with a walker. Lives in assisted living at a friend's home [place].       Dr. Pablo Ledger states that she is not a candidate for radiation therapy. Dr. Burr Medico states that she is not a candidate for anti-antigen therapy because of her stroke. She knows that her options are lumpectomy or close clinical observation. Patient her daughter are motivated towards lumpectomy.      In the interim, she was hospitalized on September 19 with acute anterior wall myocardial infarction and acute left heart systolic failure. She underwent cardiac catheterization. She stabilized and has gone home and is doing well. Ambulates independently with a walker and takes her meals of the dining hall. I have a letter from Dr. Doylene Canard and he states that she has remained stable and may undergo low risk surgery after 90 days post event. He said she is stable on  current medications with no changes.  Increased risk is understood by all.     I had a long talk with the patient and her daughter. We talked about the risk of observation alone with the possibility of cancer progression, with the uncertainty of rate of progression. We talked about the risk of surgery with extending her stroke or having another myocardial infarction or even death. We talked about this for a long time. The patient stated multiple times she does not want to live with cancer in her body and is willing to take that the risk. I think this is reasonable to since this is what she wants to do and she is not a candidate for antiestrogen therapy.  Operative Findings:       The radioactive seed was identified with the neoprobe preoperatively in the left breast at the 12:00 position.  The specimen mammogram showed the radioactive seed and the marker clip in the center of the specimen, suggesting adequate margin resection.  Procedure in Detail:          The patient was identified in the holding area and the left breast scanned with the neoprobe.  The patient was taken to the  operating room and underwent general anesthesia with LMA device.  The left breast was prepped and draped in a sterile fashion.  Surgical timeout was performed.  Intravenous antibiotics were given.  0.5% Marcaine with epinephrine was used as local infiltration anesthetic.     Using the neoprobe I identified the area of maximum radioactivity in the superior left breast.  Curvilinear incision was made in this area.  Dissection was carried down into the breast tissue and around the radioactive signal using the neoprobe frequently.  The specimen was removed and marked with silk sutures and a 6 color ink kit to orient the pathologist.  Specimen mammogram looked very good as described above and the specimen was sent to the lab.  Hemostasis was excellent and achieved with electrocautery.  Wound  was irrigated with saline.  The breast  tissues were closed with 3-0 Vicryl sutures and the skin closed with a running subcutaneous taken of 4-0 Monocryl and Dermabond.  Dry cushioning bandages and a breast binder were placed.  The patient tolerated the procedure well was taken to PACU in stable condition.  EBL 15-20 mL.  Counts correct.  Complications none.       Edsel Petrin. Dalbert Batman, M.D., FACS General and Minimally Invasive Surgery Breast and Colorectal Surgery  03/15/2015 9:27 AM

## 2015-03-15 NOTE — Discharge Instructions (Signed)
[facility] °Office Phone Number [phone] ° °BREAST BIOPSY/ PARTIAL MASTECTOMY: POST OP INSTRUCTIONS ° °Always review your discharge instruction sheet given to you by the facility where your surgery was performed. ° °IF YOU HAVE DISABILITY OR FAMILY LEAVE FORMS, YOU MUST BRING THEM TO THE OFFICE FOR PROCESSING.  DO NOT GIVE THEM TO YOUR DOCTOR. ° °1. A prescription for pain medication may be given to you upon discharge.  Take your pain medication as prescribed, if needed.  If narcotic pain medicine is not needed, then you may take acetaminophen (Tylenol) or ibuprofen (Advil) as needed. °2. Take your usually prescribed medications unless otherwise directed °3. If you need a refill on your pain medication, please contact your pharmacy.  They will contact our office to request authorization.  Prescriptions will not be filled after 5pm or on week-ends. °4. You should eat very light the first 24 hours after surgery, such as soup, crackers, pudding, etc.  Resume your normal diet the day after surgery. °5. Most patients will experience some swelling and bruising in the breast.  Ice packs and a good support bra will help.  Swelling and bruising can take several days to resolve.  °6. It is common to experience some constipation if taking pain medication after surgery.  Increasing fluid intake and taking a stool softener will usually help or prevent this problem from occurring.  A mild laxative (Milk of Magnesia or Miralax) should be taken according to package directions if there are no bowel movements after 48 hours. °7. Unless discharge instructions indicate otherwise, you may remove your bandages 24-48 hours after surgery, and you may shower at that time.  You may have steri-strips (small skin tapes) in place directly over the incision.  These strips should be left on the skin for 7-10 days.  If your surgeon used skin glue on the incision, you may shower in 24 hours.  The glue will flake off over the  next 2-3 weeks.  Any sutures or staples will be removed at the office during your follow-up visit. °8. ACTIVITIES:  You may resume regular daily activities (gradually increasing) beginning the next day.  Wearing a good support bra or sports bra minimizes pain and swelling.  You may have sexual intercourse when it is comfortable. °a. You may drive when you no longer are taking prescription pain medication, you can comfortably wear a seatbelt, and you can safely maneuver your car and apply brakes. °b. RETURN TO WORK:  ______________________________________________________________________________________ °9. You should see your doctor in the office for a follow-up appointment approximately two weeks after your surgery.  Your doctor’s nurse will typically make your follow-up appointment when she calls you with your pathology report.  Expect your pathology report 2-3 business days after your surgery.  You may call to check if you do not hear from us after three days. °10. OTHER INSTRUCTIONS: _______________________________________________________________________________________________ _____________________________________________________________________________________________________________________________________ °_____________________________________________________________________________________________________________________________________ °_____________________________________________________________________________________________________________________________________ ° °WHEN TO CALL YOUR DOCTOR: °1. Fever over 101.0 °2. Nausea and/or vomiting. °3. Extreme swelling or bruising. °4. Continued bleeding from incision. °5. Increased pain, redness, or drainage from the incision. ° °The clinic staff is available to answer your questions during regular business hours.  Please don’t hesitate to call and ask to speak to one of the nurses for clinical concerns.  If you have a medical emergency, go to the nearest  emergency room or call 911.  A surgeon from [facility] is always on call at the hospital. ° °For further questions, please visit [email or web address]  °

## 2015-03-15 NOTE — Anesthesia Procedure Notes (Signed)
Procedure Name: LMA Insertion Date/Time: 03/15/2015 8:42 AM Performed by: Rush Farmer E Pre-anesthesia Checklist: Patient identified, Emergency Drugs available, Suction available, Patient being monitored and Timeout performed Patient Re-evaluated:Patient Re-evaluated prior to inductionOxygen Delivery Method: Circle system utilized Preoxygenation: Pre-oxygenation with 100% oxygen Intubation Type: IV induction Ventilation: Mask ventilation without difficulty LMA: LMA inserted LMA Size: 4.0 Number of attempts: 1 Placement Confirmation: positive ETCO2 and breath sounds checked- equal and bilateral Tube secured with: Tape Dental Injury: Teeth and Oropharynx as per pre-operative assessment

## 2015-03-15 NOTE — OR Nursing (Addendum)
Dr. Royce Macadamia at bs, states that SBP is patient's baseline before surgery and what she needs for perfusion pressure. No new orders, no tx needed for SBP.

## 2015-03-16 ENCOUNTER — Non-Acute Institutional Stay: Payer: Medicare Other | Admitting: Nurse Practitioner

## 2015-03-16 ENCOUNTER — Encounter: Payer: Self-pay | Admitting: Nurse Practitioner

## 2015-03-16 DIAGNOSIS — C50412 Malignant neoplasm of upper-outer quadrant of left female breast: Secondary | ICD-10-CM

## 2015-03-16 DIAGNOSIS — N393 Stress incontinence (female) (male): Secondary | ICD-10-CM | POA: Diagnosis not present

## 2015-03-16 DIAGNOSIS — K219 Gastro-esophageal reflux disease without esophagitis: Secondary | ICD-10-CM | POA: Diagnosis not present

## 2015-03-16 DIAGNOSIS — D500 Iron deficiency anemia secondary to blood loss (chronic): Secondary | ICD-10-CM

## 2015-03-16 DIAGNOSIS — F411 Generalized anxiety disorder: Secondary | ICD-10-CM

## 2015-03-16 DIAGNOSIS — I10 Essential (primary) hypertension: Secondary | ICD-10-CM | POA: Diagnosis not present

## 2015-03-16 NOTE — Progress Notes (Signed)
Patient ID: [name], female   DOB: [birth year], 82 y.o.   MRN: [id number]  Location:  [facility] Provider:  Marlana Latus NP  Code Status:  Full Code Goals of care: Advanced Directive information Does patient have an advance directive?: Yes, Type of Advance Directive: Healthcare Power of Attorney  Chief Complaint  Patient presents with  . Medical Management of Chronic Issues    Routine Visit     HPI: Patient is a 82 y.o. female seen in the [facility] at [facility] today for evaluation of s/p lumpetomy for breast cancer of upper outer quadrant of left female breast. Hx of CHF compensated clinically while on Furosemide, low K 3.2 12/08/14, CKD worsening creat 2.80 12/08/14.   Review of Systems  Constitutional: Negative for fever, chills and diaphoresis.  HENT: Positive for hearing loss. Negative for congestion and ear pain.   Eyes: Negative.  Negative for discharge and redness.  Respiratory: Negative.  Negative for cough and shortness of breath.   Cardiovascular: Negative for chest pain, palpitations and leg swelling (ankles).       Resolved edema BLE  Gastrointestinal: Negative for heartburn, nausea, vomiting, abdominal pain, diarrhea and constipation.       History of diverticulosis. She denies any abdominal pain.  Genitourinary: Positive for frequency. Negative for dysuria, urgency and flank pain.  Musculoskeletal: Positive for back pain. Negative for myalgias and neck pain.       Flat feet with dropped arches. Pin in the right medial malleolar bones. Chronic back pain. Chronic right shoulder pain. Right knee pain. Uses 4 wheel walker.  Skin: Negative.  Negative for itching and rash.       S/p left upper outer lumpectomy  Neurological: Negative for dizziness, tingling, tremors, sensory change, speech change, focal weakness and weakness (Generalized).  Endo/Heme/Allergies:       History of anemia.  Psychiatric/Behavioral: Negative for depression. The patient is not  nervous/anxious.     Past Medical History  Diagnosis Date  . Senile osteoporosis   . Heart murmur   . Small bowel obstruction ([place])   . GERD (gastroesophageal reflux disease)   . Hypertension   . Glaucoma   . Arthritis     Osteoarthritis  . Personal history of fall   . Edema   . Hyperlipidemia   . Diverticulosis of colon (without mention of hemorrhage)   . Other malaise and fatigue   . Hypertonicity of bladder   . Osteoarthrosis, unspecified whether generalized or localized, unspecified site   . Lumbago   . Edema   . Pain in joint, pelvic region and thigh   . Pain in joint, shoulder region   . Flat feet   . Cerebral ischemia 12/19/2012    Microvascular   . Cerebral atrophy 12/19/2012  . Cerebral atherosclerosis 12/19/2012  . RLQ abdominal pain 01/17/2011  . Urine, incontinence, stress female 09/18/2014  . CKD (chronic kidney disease) stage 3, GFR 30-59 ml/min 11/03/2014  . STEMI (ST elevation myocardial infarction) ([place]) 11/04/2014  . Cancer of left breast [facility]) August 2016  . Ischemic cardiomyopathy 11/04/2014  . Unstable gait 11/04/2014  . Dementia   . Cough     Patient Active Problem List   Diagnosis Date Noted  . UTI (urinary tract infection) 12/15/2014  . Acute systolic congestive heart failure ([place]) 11/17/2014  . CKD (chronic kidney disease) stage 3, GFR 30-59 ml/min 11/03/2014  . Breast cancer of upper-outer quadrant of left female breast ([place]) 10/17/2014  . Urine, incontinence,  stress female 09/18/2014  . Right shoulder pain 03/21/2014  . Mouth sore 03/21/2014  . CVA (cerebral vascular accident) ([place]) 03/14/2014  . Wedge compression fracture of T6 vertebra ([place]) 03/02/2014  . Right knee pain 02/06/2014  . Loss of weight 02/03/2014  . Dyspnea 01/16/2014  . Anxiety state 01/16/2014  . Cerebral ischemia 12/19/2012  . Cerebral atrophy 12/19/2012  . Cerebral atherosclerosis 12/19/2012  . Anemia 12/19/2012  . Chronic diastolic congestive heart failure ([place])  09/10/2012  . Flat feet   . Lumbago   . Hypertension   . Pain in joint, shoulder region   . Hypertonicity of bladder   . Edema   . Hyperlipidemia   . GERD (gastroesophageal reflux disease)   . RLQ abdominal pain 01/17/2011    Allergies  Allergen Reactions  . Sulfa Antibiotics Itching    Medications: Patient's Medications  New Prescriptions   No medications on file  Previous Medications   ACETAMINOPHEN (TYLENOL) 325 MG TABLET    Take 2 tablets (650 mg total) by mouth every 6 (six) hours as needed for mild pain (or Fever >/= 101).   ALUM & MAG HYDROXIDE-SIMETH (MAALOX/MYLANTA) [id number] MG/5ML SUSPENSION    Take by mouth. 63ml every 4 hours as needed for indigestion   ATORVASTATIN (LIPITOR) 20 MG TABLET    Take one tablet by mouth once daily for cholesterol   BETIMOL 0.5 % OPHTHALMIC SOLUTION    Place 1 drop into both eyes every morning.    CARVEDILOL (COREG) 3.125 MG TABLET    Take one tablet by mouth twice daily with a meal   CLONIDINE (CATAPRES) 0.1 MG TABLET    Take 0.1 mg by mouth 2 (two) times daily. 1 tab every 8 hours as needed for blood pressure >180/100   COENZYME Q10 50 MG TABS    Take by mouth. Take one tablet by mouth at bedtime   FERROUS SULFATE 325 (65 FE) MG TABLET    Take 325 mg by mouth daily with breakfast.   FUROSEMIDE (LASIX) 40 MG TABLET    Take 1 tablet (40 mg total) by mouth daily.   HYDROCODONE-ACETAMINOPHEN (NORCO) 5-325 MG TABLET    Take 1-2 tablets by mouth every 6 (six) hours as needed for moderate pain or severe pain.   ISOSORBIDE MONONITRATE (IMDUR) 30 MG 24 HR TABLET    Take 30 mg by mouth at bedtime.   KLOR-CON 10 10 MEQ TABLET    Take 10 mEq by mouth daily.   LATANOPROST (XALATAN) 0.005 % OPHTHALMIC SOLUTION    Place 1 drop into both eyes at bedtime.    MIRTAZAPINE (REMERON) 7.5 MG TABLET    TAKE 1 TABLET (7.5 MG TOTAL) BY MOUTH AT BEDTIME. NIGHTLY FOR APPETITE   OMEPRAZOLE (PRILOSEC) 20 MG CAPSULE    Take 20 mg by mouth daily.   OXYBUTYNIN  (DITROPAN-XL) 5 MG 24 HR TABLET    Take 5 mg by mouth at bedtime.   POLYVINYL ALCOHOL (LIQUIFILM TEARS) 1.4 % OPHTHALMIC SOLUTION    Place 2 drops into both eyes 2 (two) times daily.   VITAMIN B-12 (CYANOCOBALAMIN) 1000 MCG TABLET    Take 1,000 mcg by mouth 2 (two) times daily. On Mondays,Wednesdays and Fridays  Modified Medications   No medications on file  Discontinued Medications   CO-ENZYME Q-10 30 MG CAPSULE    Take 30 mg by mouth at bedtime. Reported on 03/16/2015    Physical Exam: Filed Vitals:   03/16/15 0822  BP: 138/66  Pulse: 70  Temp: 98 F (36.7 C)  TempSrc: Oral  Resp: 20   There is no weight on file to calculate BMI.  Physical Exam  Constitutional: She is oriented to person, place, and time. She appears well-developed and well-nourished. No distress.  HENT:  Head: Normocephalic and atraumatic.  Right Ear: External ear normal.  Left Ear: External ear normal.  Nose: Nose normal.  Mouth/Throat: Oropharynx is clear and moist. No oropharyngeal exudate.  Eyes: Conjunctivae and EOM are normal. Pupils are equal, round, and reactive to light. Right eye exhibits no discharge. Left eye exhibits no discharge. No scleral icterus.  Neck: Normal range of motion. Neck supple. No JVD present. No thyromegaly present.  Cardiovascular: Normal rate and regular rhythm.   Murmur heard.  Systolic murmur is present with a grade of 2/6  Pulmonary/Chest: Effort normal. No respiratory distress. She has no wheezes. She has rales (sparse). She exhibits no tenderness.  Abdominal: Soft. Bowel sounds are normal. She exhibits no distension. There is no tenderness. There is no rebound.  Musculoskeletal: Normal range of motion. She exhibits no edema (1+  bipedal) or tenderness.  Right knee deformity with osteoarthritic changes.  Lymphadenopathy:    She has no cervical adenopathy.  Neurological: She is alert and oriented to person, place, and time. She has normal reflexes. No cranial nerve deficit.  She exhibits normal muscle tone. Coordination normal.  04/21/14 MMSE 28/30. Failed clock drawing.   Skin: Skin is warm and dry. No rash noted. She is not diaphoretic. No erythema.  The left upper outer breast s/p lumpectomy  Psychiatric: She has a normal mood and affect. Her behavior is normal. Judgment and thought content normal.    Labs reviewed: Basic Metabolic Panel:  Recent Labs  11/06/14 0340 11/08/14 0316 11/09/14 0230 11/10/14 0331  11/29/14 12/22/14 03/07/15 1321  NA 142 138 137 137  < > 142 140 143  K 3.4* 3.3* 4.3 3.8  < > 3.5 3.7 4.2  CL 109 108 109 108  --   --   --  106  CO2 24 22 21* 21*  --   --   --  24  GLUCOSE 106* 104* 98 95  --   --   --  105*  BUN 37* 26* 23* 22*  < > 32* 22* 24*  CREATININE 2.62* 2.60* 2.57* 2.69*  < > 2.5* 2.6* 2.66*  CALCIUM 8.2* 8.5* 8.6* 8.6*  --   --   --  9.5  MG 2.3 2.2 2.2  --   --   --   --   --   PHOS 4.3 3.8 4.4 4.3  --   --   --   --   < > = values in this interval not displayed.  Liver Function Tests:  Recent Labs  10/19/14 0824 11/04/14 1545  11/09/14 0230 11/10/14 0331 11/21/14 03/07/15 1321  AST 15 21  --   --   --  13 16  ALT 8 13*  --   --   --  5* 7*  ALKPHOS 48 55  --   --   --  47 44  BILITOT 0.72 0.7  --   --   --   --  0.7  PROT 6.3* 7.5  --   --   --   --  6.7  ALBUMIN 3.5 4.4  < > 2.8* 2.8*  --  3.8  < > = values in this interval not displayed.  CBC:  Recent Labs  11/09/14  0230 11/10/14 0331 11/21/14 03/07/15 1321  WBC 8.3 7.7 8.8 5.6  NEUTROABS 5.8 5.3  --  3.3  HGB 9.8* 9.2* 11.0* 11.7*  HCT 31.1* 29.3* 33* 35.9*  MCV 92.3 92.4  --  92.8  PLT 146* 134* 217 183    Lab Results  Component Value Date   TSH 2.227 08/16/2014   Lab Results  Component Value Date   HGBA1C 5.6 11/06/2014   Lab Results  Component Value Date   CHOL 109 11/06/2014   HDL 35* 11/06/2014   LDLCALC 49 11/06/2014   TRIG 124 11/06/2014   CHOLHDL 3.1 11/06/2014    Significant Diagnostic Results since last  visit:    labs: WBC count was 19.4 K, Hgb and platelets count were normal. BUN was 37 and creatinine was 2.30. BNP was 618.3 Urine culture showed over 100,000 colonies of enterococcus species sensitive to all antibiotics tested. Troponin-I peaked at 12.13  EKG-ST elevation in V1, V2 suggestive of acute anterior wall MI.  Chest x-ray showed pulmonary edema, repeat chest x-ray showed mild improvement.  Echocardiogram showed : - Left ventricle: The cavity size was mildly dilated. There was mild concentric hypertrophy. Systolic function was moderately reduced. The estimated ejection fraction was in the range of 35% to 40%. There is severe hypokinesis of the entire anteroseptal myocardium; consistent with ischemia. Doppler parameters are consistent with abnormal left ventricular relaxation (grade 1 diastolic dysfunction). - Aortic valve: Transvalvular velocity was increased, due to stenosis. There was mild stenosis. - Mitral valve: Calcified annulus. There was moderate regurgitation. - Left atrium: The atrium was moderately to severely dilated. - Right atrium: The atrium was moderately dilated. - Tricuspid valve: There was severe regurgitation. - Pulmonary arteries: Systolic pressure was mildly to moderately increased. - Pericardium, extracardiac: A trivial pericardial effusion was identified.  Cardiac cath showed :  Mid RCA lesion, 20% stenosed. The lesion was not previously treated.  Mid RCA to Dist RCA lesion, 20% stenosed. The lesion was not previously treated.  Ost Cx lesion, 30% stenosed. The lesion was not previously treated.  Ost 1st Diag lesion, 40% stenosed. The lesion was not previously treated.  1st Diag lesion, 100% stenosed. The lesion was not previously treated.  Lat Ramus lesion, 95% stenosed. The lesion was not previously treated.  Prox LAD-2 lesion, 75% stenosed. The lesion was not previously treated.  Prox LAD-1 lesion, 30% stenosed. The lesion was not  previously treated.   Patient Care Team: Estill Dooms, MD as PCP - General (Internal Medicine) Juanita Craver, MD as Consulting Physician (Gastroenterology) Shon Hough, MD as Consulting Physician (Ophthalmology) Thornell Sartorius, MD as Consulting Physician (Otolaryngology) Crystal Man Otho Darner, NP as Nurse Practitioner (Nurse Practitioner) Bo Merino, MD as Consulting Physician (Rheumatology) Suella Broad, MD as Consulting Physician (Physical Medicine and Rehabilitation) Netta Cedars, MD as Consulting Physician (Orthopedic Surgery) Fanny Skates, MD as Consulting Physician (General Surgery) Truitt Merle, MD as Consulting Physician (Hematology) Thea Silversmith, MD as Consulting Physician (Radiation Oncology) Rockwell Germany, RN as Registered Nurse Mauro Kaufmann, RN as Registered Nurse Sylvan Cheese, NP as Nurse Practitioner (Nurse Practitioner) [name], MD as Consulting Physician (Cardiology)  Assessment/Plan Problem List Items Addressed This Visit    Urine, incontinence, stress female - Primary (Chronic)    Manageable, continue Oxybutynin 5mg  daily.       Hypertension (Chronic)    Permissive blood pressure control, continue Carvedilol 3.125mg  bid, Clonidine 0.1mg  bid, Imdur 15mg , Furosemide 40mg   GERD (gastroesophageal reflux disease)    Stable, continue Omeprazole 20mg  daily.       Breast cancer of upper-outer quadrant of left female breast ([place]) (Chronic)    S/p lumpectomy, f/u surgeon, prn Norco available for pain, observe.       Anxiety state    Stable, cont Mirtazapine 7.5mg  nightly      Anemia (Chronic)    12/08/14 Hgb 10.9, continue Vit B12, Fe          Family/ staff Communication: observe the patient.   Labs/tests ordered: none  ManXie Mast NP [facility]iatric[facility] [address], [place] [address] On Call:  [phone] & follow prompts after 5pm & weekends Office  Phone:  [phone] Office Fax:  [phone]

## 2015-03-16 NOTE — Assessment & Plan Note (Signed)
S/p lumpectomy, f/u surgeon, prn Norco available for pain, observe.

## 2015-03-16 NOTE — Assessment & Plan Note (Signed)
Stable, continue Omeprazole 20mg daily.  

## 2015-03-16 NOTE — Assessment & Plan Note (Signed)
Stable, cont Mirtazapine 7.5mg nightly  

## 2015-03-16 NOTE — Progress Notes (Signed)
Patient ID: [name], female   DOB: 05/27/1925, 82 y.o.   MRN: [id number]

## 2015-03-16 NOTE — Assessment & Plan Note (Signed)
Permissive blood pressure control, continue Carvedilol 3.125mg  bid, Clonidine 0.1mg  bid, Imdur 15mg , Furosemide 40mg 

## 2015-03-16 NOTE — Assessment & Plan Note (Signed)
12/08/14 Hgb 10.9, continue Vit B12, Fe

## 2015-03-16 NOTE — Assessment & Plan Note (Signed)
Manageable, continue Oxybutynin 5mg  daily.

## 2015-03-20 ENCOUNTER — Other Ambulatory Visit: Payer: Self-pay | Admitting: *Deleted

## 2015-03-20 DIAGNOSIS — C50412 Malignant neoplasm of upper-outer quadrant of left female breast: Secondary | ICD-10-CM

## 2015-03-20 NOTE — Progress Notes (Signed)
Quick Note:  Inform patient of Pathology report,. 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 Tell her that the margins are close but they are negative and she will not need any further surgery. I will discuss this with her and her family in detail in the next office visit.  hmi ______

## 2015-03-21 ENCOUNTER — Telehealth: Payer: Self-pay | Admitting: Hematology

## 2015-03-21 NOTE — Telephone Encounter (Signed)
Spoke to patient in regards to survivorship program and confirmed date/time 2/9 @ 130 pm with Chestine Spore per 1/30 pof.

## 2015-03-22 DIAGNOSIS — R2681 Unsteadiness on feet: Secondary | ICD-10-CM | POA: Diagnosis not present

## 2015-03-22 DIAGNOSIS — M25561 Pain in right knee: Secondary | ICD-10-CM | POA: Diagnosis not present

## 2015-03-22 DIAGNOSIS — M6281 Muscle weakness (generalized): Secondary | ICD-10-CM | POA: Diagnosis not present

## 2015-03-27 ENCOUNTER — Telehealth: Payer: Self-pay | Admitting: Nurse Practitioner

## 2015-03-27 NOTE — Telephone Encounter (Signed)
Called and spoke with patient.  Doing well post surgery.  Saw Dr. Dalbert Batman last week who felt that she was recovering well; recommended follow up with him in one year's time following mammogram.  Will mail copy of survivorship care plan to her along with contact information in the event she has any questions or problems.  Pt voiced understanding.

## 2015-03-29 ENCOUNTER — Encounter: Payer: Self-pay | Admitting: Nurse Practitioner

## 2015-03-29 DIAGNOSIS — C50412 Malignant neoplasm of upper-outer quadrant of left female breast: Secondary | ICD-10-CM

## 2015-03-29 NOTE — Progress Notes (Signed)
The Survivorship Care Plan was mailed to [name] as she reported not being able to come in to the Survivorship Clinic for an in-person visit at this time. A letter was mailed to her outlining the purpose of the content of the care plan, as well as encouraging her to reach out to me with any questions or concerns.  My business card was included in the correspondence to the patient as well.  A copy of the care plan was also routed/faxed/mailed to GREEN, [name], MD, the patient's PCP.  I will not be placing any follow-up appointments to the Survivorship Clinic for [name], but I am happy to see her at any time in the future for any survivorship concerns that may arise. Thank you for allowing me to participate in her care!  Kenn File, [place] [phone]

## 2015-03-30 ENCOUNTER — Encounter: Payer: Medicare Other | Admitting: Nurse Practitioner

## 2015-04-03 DIAGNOSIS — H5203 Hypermetropia, bilateral: Secondary | ICD-10-CM | POA: Diagnosis not present

## 2015-04-03 DIAGNOSIS — Z961 Presence of intraocular lens: Secondary | ICD-10-CM | POA: Diagnosis not present

## 2015-04-03 DIAGNOSIS — H4010X3 Unspecified open-angle glaucoma, severe stage: Secondary | ICD-10-CM | POA: Diagnosis not present

## 2015-04-03 DIAGNOSIS — H16101 Unspecified superficial keratitis, right eye: Secondary | ICD-10-CM | POA: Diagnosis not present

## 2015-04-05 DIAGNOSIS — M1711 Unilateral primary osteoarthritis, right knee: Secondary | ICD-10-CM | POA: Diagnosis not present

## 2015-04-13 ENCOUNTER — Non-Acute Institutional Stay: Payer: Medicare Other | Admitting: Nurse Practitioner

## 2015-04-13 ENCOUNTER — Encounter: Payer: Self-pay | Admitting: Nurse Practitioner

## 2015-04-13 DIAGNOSIS — I5032 Chronic diastolic (congestive) heart failure: Secondary | ICD-10-CM

## 2015-04-13 DIAGNOSIS — G8929 Other chronic pain: Secondary | ICD-10-CM

## 2015-04-13 DIAGNOSIS — M545 Low back pain, unspecified: Secondary | ICD-10-CM

## 2015-04-13 DIAGNOSIS — D649 Anemia, unspecified: Secondary | ICD-10-CM | POA: Diagnosis not present

## 2015-04-13 DIAGNOSIS — N393 Stress incontinence (female) (male): Secondary | ICD-10-CM | POA: Diagnosis not present

## 2015-04-13 DIAGNOSIS — I10 Essential (primary) hypertension: Secondary | ICD-10-CM | POA: Diagnosis not present

## 2015-04-13 DIAGNOSIS — K219 Gastro-esophageal reflux disease without esophagitis: Secondary | ICD-10-CM | POA: Diagnosis not present

## 2015-04-13 DIAGNOSIS — R609 Edema, unspecified: Secondary | ICD-10-CM | POA: Diagnosis not present

## 2015-04-13 DIAGNOSIS — F411 Generalized anxiety disorder: Secondary | ICD-10-CM

## 2015-04-13 NOTE — Progress Notes (Signed)
Patient ID: [name], female   DOB: 10-19-1925, 82 y.o.   MRN: [id number]  Location:  [facility] Room Number: RCB 901 Place of Service:  AL Provider:  Lennie Odor Mast NP  GREEN, [name], MD  Patient Care Team: Estill Dooms, MD as PCP - General (Internal Medicine) Juanita Craver, MD as Consulting Physician (Gastroenterology) Shon Hough, MD as Consulting Physician (Ophthalmology) Thornell Sartorius, MD as Consulting Physician (Otolaryngology) [place] Otho Darner, NP as Nurse Practitioner (Nurse Practitioner) Bo Merino, MD as Consulting Physician (Rheumatology) Suella Broad, MD as Consulting Physician (Physical Medicine and Rehabilitation) Netta Cedars, MD as Consulting Physician (Orthopedic Surgery) Fanny Skates, MD as Consulting Physician (General Surgery) Truitt Merle, MD as Consulting Physician (Hematology) Thea Silversmith, MD as Consulting Physician (Radiation Oncology) Rockwell Germany, RN as Registered Nurse Mauro Kaufmann, RN as Registered Nurse Sylvan Cheese, NP as Nurse Practitioner (Nurse Practitioner) [name], MD as Consulting Physician (Cardiology)  Extended Emergency Contact Information Primary Emergency Contact: [name] Address: [address]          [place] [address] Johnnette Litter of [place] Phone: [phone] Mobile Phone: [phone] Relation: Daughter Secondary Emergency Contact: [name]  [place] Mobile Phone: [phone] Relation: None  Code Status:  DNR Goals of care: Advanced Directive information Advanced Directives 04/13/2015  Does patient have an advance directive? Yes  Type of Paramedic of [place];Out of facility DNR (pink MOST or yellow form)  Does patient want to make changes to advanced directive? No - Patient declined  Copy of advanced directive(s) in chart? Yes     Chief Complaint  Patient presents with  .  Medical Management of Chronic Issues    Routine Visit    HPI:  Pt is a 82 y.o. female seen today for medical management of chronic diseases.     Past Medical History  Diagnosis Date  . Senile osteoporosis   . Heart murmur   . Small bowel obstruction ([place])   . GERD (gastroesophageal reflux disease)   . Hypertension   . Glaucoma   . Arthritis     Osteoarthritis  . Personal history of fall   . Edema   . Hyperlipidemia   . Diverticulosis of colon (without mention of hemorrhage)   . Other malaise and fatigue   . Hypertonicity of bladder   . Osteoarthrosis, unspecified whether generalized or localized, unspecified site   . Lumbago   . Edema   . Pain in joint, pelvic region and thigh   . Pain in joint, shoulder region   . Flat feet   . Cerebral ischemia 12/19/2012    Microvascular   . Cerebral atrophy 12/19/2012  . Cerebral atherosclerosis 12/19/2012  . RLQ abdominal pain 01/17/2011  . Urine, incontinence, stress female 09/18/2014  . CKD (chronic kidney disease) stage 3, GFR 30-59 ml/min 11/03/2014  . STEMI (ST elevation myocardial infarction) ([place]) 11/04/2014  . Cancer of left breast [facility]) August 2016  . Ischemic cardiomyopathy 11/04/2014  . Unstable gait 11/04/2014  . Dementia   . Cough    Past Surgical History  Procedure Laterality Date  . Tonsillectomy    . Colon surgery  02/1996    Colectomy, partial for diverticular bleed 90%  . Joint replacement Left 12/2000    Left knee; Dr. Wynelle Link  . Breast lumpectomy Bilateral   . Laparoscopic lysis of adhesions  11/15/10    Exploratory  . Cardiac catheterization N/A 11/08/2014  Procedure: Left Heart Cath and Coronary Angiography;  Surgeon: [name], MD;  Location: [facility];  Service: Cardiovascular;  Laterality: N/A;  . Eye surgery    . Breast lumpectomy with radioactive seed localization Left 03/15/2015    Procedure: LEFT BREAST LUMPECTOMY WITH RADIOACTIVE SEED LOCALIZATION;  Surgeon: Fanny Skates, MD;   Location: [facility];  Service: General;  Laterality: Left;    Allergies  Allergen Reactions  . Sulfa Antibiotics Itching      Medication List       This list is accurate as of: 04/13/15 11:59 PM.  Always use your most recent med list.               acetaminophen 325 MG tablet  Commonly known as:  TYLENOL  Take 2 tablets (650 mg total) by mouth every 6 (six) hours as needed for mild pain (or Fever >/= 101).     alum & mag hydroxide-simeth 200-200-20 MG/5ML suspension  Commonly known as:  MAALOX/MYLANTA  Take by mouth. 29ml every 4 hours as needed for indigestion     atorvastatin 20 MG tablet  Commonly known as:  LIPITOR  Take one tablet by mouth once daily for cholesterol     BETIMOL 0.5 % ophthalmic solution  Generic drug:  timolol  Place 1 drop into both eyes every morning.     carvedilol 3.125 MG tablet  Commonly known as:  COREG  Take one tablet by mouth twice daily with a meal     cloNIDine 0.1 MG tablet  Commonly known as:  CATAPRES  Take 0.1 mg by mouth 2 (two) times daily. 1 tab every 8 hours as needed for blood pressure >180/100     Coenzyme Q10 50 MG Tabs  Take by mouth. Take one tablet by mouth at bedtime     ferrous sulfate 325 (65 FE) MG tablet  Take 325 mg by mouth daily with breakfast.     furosemide 40 MG tablet  Commonly known as:  LASIX  Take 1 tablet (40 mg total) by mouth daily.     isosorbide mononitrate 30 MG 24 hr tablet  Commonly known as:  IMDUR  Take 30 mg by mouth at bedtime.     KLOR-CON 10 10 MEQ tablet  Generic drug:  potassium chloride  Take 10 mEq by mouth daily.     latanoprost 0.005 % ophthalmic solution  Commonly known as:  XALATAN  Place 1 drop into both eyes at bedtime.     mirtazapine 7.5 MG tablet  Commonly known as:  REMERON  TAKE 1 TABLET (7.5 MG TOTAL) BY MOUTH AT BEDTIME. NIGHTLY FOR APPETITE     omeprazole 20 MG capsule  Commonly known as:  PRILOSEC  Take 20 mg by mouth daily.     oxybutynin 5 MG 24 hr  tablet  Commonly known as:  DITROPAN-XL  Take 5 mg by mouth at bedtime.     polyvinyl alcohol 1.4 % ophthalmic solution  Commonly known as:  LIQUIFILM TEARS  Place 2 drops into both eyes 2 (two) times daily.     vitamin B-12 1000 MCG tablet  Commonly known as:  CYANOCOBALAMIN  Take 1,000 mcg by mouth 2 (two) times daily. On Mondays,Wednesdays and Fridays        Review of Systems  Constitutional: Negative for fever, chills and diaphoresis.  HENT: Positive for hearing loss. Negative for congestion and ear pain.   Eyes: Negative.  Negative for discharge and redness.  Respiratory: Negative.  Negative for cough and shortness of breath.   Cardiovascular: Negative for chest pain, palpitations and leg swelling (ankles).       Resolved edema BLE  Gastrointestinal: Negative for nausea, vomiting, abdominal pain, diarrhea and constipation.       History of diverticulosis. She denies any abdominal pain.  Genitourinary: Positive for frequency. Negative for dysuria, urgency and flank pain.  Musculoskeletal: Positive for back pain. Negative for myalgias and neck pain.       Flat feet with dropped arches. Pin in the right medial malleolar bones. Chronic back pain. Chronic right shoulder pain. Right knee pain. Uses 4 wheel walker.  Skin: Negative.  Negative for rash.       S/p left upper outer lumpectomy  Neurological: Negative for dizziness, tremors and weakness (Generalized).  Hematological:       History of anemia.  Psychiatric/Behavioral: The patient is not nervous/anxious.     Immunization History  Administered Date(s) Administered  . Influenza Whole 11/19/2011, 12/02/2012  . Influenza-Unspecified 12/06/2013  . PPD Test 11/10/2014  . Pneumococcal Polysaccharide-23 02/18/2009   Pertinent  Health Maintenance Due  Topic Date Due  . PNA vac Low Risk Adult (2 of 2 - PCV13) 02/18/2010  . INFLUENZA VACCINE  09/19/2014  . MAMMOGRAM  10/13/2015  . DEXA SCAN  Completed   Fall Risk   11/17/2014 04/21/2014 12/17/2012  Falls in the past year? Yes Yes Yes  Number falls in past yr: 1 1 1   Injury with Fall? No No Yes  Risk for fall due to : Impaired balance/gait;History of fall(s) - -   Functional Status Survey:    Filed Vitals:   04/14/15 1036  BP: 120/72  Pulse: 82  Temp: 98.3 F (36.8 C)  TempSrc: Oral  Resp: 21   There is no weight on file to calculate BMI. Physical Exam  Constitutional: She is oriented to person, place, and time. She appears well-developed and well-nourished. No distress.  HENT:  Head: Normocephalic and atraumatic.  Right Ear: External ear normal.  Left Ear: External ear normal.  Nose: Nose normal.  Mouth/Throat: Oropharynx is clear and moist. No oropharyngeal exudate.  Eyes: Conjunctivae and EOM are normal. Pupils are equal, round, and reactive to light. Right eye exhibits no discharge. Left eye exhibits no discharge. No scleral icterus.  Neck: Normal range of motion. Neck supple. No JVD present. No thyromegaly present.  Cardiovascular: Normal rate and regular rhythm.   Murmur heard.  Systolic murmur is present with a grade of 2/6  Pulmonary/Chest: Effort normal. No respiratory distress. She has no wheezes. She has rales (sparse). She exhibits no tenderness.  Abdominal: Soft. Bowel sounds are normal. She exhibits no distension. There is no tenderness. There is no rebound.  Musculoskeletal: Normal range of motion. She exhibits no edema (1+  bipedal) or tenderness.  Right knee deformity with osteoarthritic changes.  Lymphadenopathy:    She has no cervical adenopathy.  Neurological: She is alert and oriented to person, place, and time. She has normal reflexes. No cranial nerve deficit. She exhibits normal muscle tone. Coordination normal.  04/21/14 MMSE 28/30. Failed clock drawing.   Skin: Skin is warm and dry. No rash noted. She is not diaphoretic. No erythema.  The left upper outer breast s/p lumpectomy  Psychiatric: She has a normal mood  and affect. Her behavior is normal. Judgment and thought content normal.    Labs reviewed:  Recent Labs  11/06/14 0340 11/08/14 0316 11/09/14 0230 11/10/14 0331  11/29/14 12/22/14 03/07/15 1321  NA 142 138 137 137  < > 142 140 143  K 3.4* 3.3* 4.3 3.8  < > 3.5 3.7 4.2  CL 109 108 109 108  --   --   --  106  CO2 24 22 21* 21*  --   --   --  24  GLUCOSE 106* 104* 98 95  --   --   --  105*  BUN 37* 26* 23* 22*  < > 32* 22* 24*  CREATININE 2.62* 2.60* 2.57* 2.69*  < > 2.5* 2.6* 2.66*  CALCIUM 8.2* 8.5* 8.6* 8.6*  --   --   --  9.5  MG 2.3 2.2 2.2  --   --   --   --   --   PHOS 4.3 3.8 4.4 4.3  --   --   --   --   < > = values in this interval not displayed.  Recent Labs  10/19/14 0824 11/04/14 1545  11/09/14 0230 11/10/14 0331 11/21/14 03/07/15 1321  AST 15 21  --   --   --  13 16  ALT 8 13*  --   --   --  5* 7*  ALKPHOS 48 55  --   --   --  47 44  BILITOT 0.72 0.7  --   --   --   --  0.7  PROT 6.3* 7.5  --   --   --   --  6.7  ALBUMIN 3.5 4.4  < > 2.8* 2.8*  --  3.8  < > = values in this interval not displayed.  Recent Labs  11/09/14 0230 11/10/14 0331 11/21/14 03/07/15 1321  WBC 8.3 7.7 8.8 5.6  NEUTROABS 5.8 5.3  --  3.3  HGB 9.8* 9.2* 11.0* 11.7*  HCT 31.1* 29.3* 33* 35.9*  MCV 92.3 92.4  --  92.8  PLT 146* 134* 217 183   Lab Results  Component Value Date   TSH 2.227 08/16/2014   Lab Results  Component Value Date   HGBA1C 5.6 11/06/2014   Lab Results  Component Value Date   CHOL 109 11/06/2014   HDL 35* 11/06/2014   LDLCALC 49 11/06/2014   TRIG 124 11/06/2014   CHOLHDL 3.1 11/06/2014    Significant Diagnostic Results in last 30 days:  No results found.  Assessment/Plan  Lumbago Tolerable, continue Tylenol prn instead of Norco per patient's request.   Hypertension Permissive blood pressure control, continue Carvedilol 3.125mg  bid, Clonidine 0.1mg  bid, Imdur 15mg , Furosemide 40mg   Edema Not apparent BLE, continue Furosemide 40mg  daily, Kcl  [id number]  Chronic diastolic congestive heart failure Compensated clinically, continue Furosemide 40mg , Kcl 14meq  Anemia 12/08/14 Hgb 10.9, continue Vit B12, Fe  Urine, incontinence, stress female Manageable, continue Oxybutynin 5mg  daily.   GERD (gastroesophageal reflux disease) Stable, continue Omeprazole 20mg  daily.   Anxiety state Stable, cont Mirtazapine 7.5mg  nightly    Family/ staff Communication: continue AL for care needs.   Labs/tests ordered: none

## 2015-04-13 NOTE — Assessment & Plan Note (Signed)
Tolerable, continue Tylenol prn instead of Norco per patient's request.

## 2015-04-14 ENCOUNTER — Encounter: Payer: Self-pay | Admitting: Nurse Practitioner

## 2015-04-14 NOTE — Assessment & Plan Note (Signed)
Manageable, continue Oxybutynin 5mg  daily.

## 2015-04-14 NOTE — Assessment & Plan Note (Signed)
12/08/14 Hgb 10.9, continue Vit B12, Fe

## 2015-04-14 NOTE — Assessment & Plan Note (Signed)
Stable, cont Mirtazapine 7.5mg nightly  

## 2015-04-14 NOTE — Assessment & Plan Note (Signed)
Stable, continue Omeprazole 20mg daily.  

## 2015-04-14 NOTE — Assessment & Plan Note (Signed)
Permissive blood pressure control, continue Carvedilol 3.125mg  bid, Clonidine 0.1mg  bid, Imdur 15mg , Furosemide 40mg 

## 2015-04-14 NOTE — Assessment & Plan Note (Signed)
Compensated clinically, continue Furosemide 40mg , Kcl 59meq

## 2015-04-14 NOTE — Assessment & Plan Note (Signed)
Not apparent BLE, continue Furosemide 40mg  daily, Kcl 35meq

## 2015-04-17 DIAGNOSIS — R509 Fever, unspecified: Secondary | ICD-10-CM | POA: Diagnosis not present

## 2015-04-17 DIAGNOSIS — R350 Frequency of micturition: Secondary | ICD-10-CM | POA: Diagnosis not present

## 2015-05-03 DIAGNOSIS — M1711 Unilateral primary osteoarthritis, right knee: Secondary | ICD-10-CM | POA: Diagnosis not present

## 2015-05-11 ENCOUNTER — Non-Acute Institutional Stay: Payer: Medicare Other | Admitting: Nurse Practitioner

## 2015-05-11 DIAGNOSIS — R609 Edema, unspecified: Secondary | ICD-10-CM | POA: Diagnosis not present

## 2015-05-11 DIAGNOSIS — N183 Chronic kidney disease, stage 3 unspecified: Secondary | ICD-10-CM

## 2015-05-11 DIAGNOSIS — N390 Urinary tract infection, site not specified: Secondary | ICD-10-CM | POA: Diagnosis not present

## 2015-05-11 DIAGNOSIS — M5440 Lumbago with sciatica, unspecified side: Secondary | ICD-10-CM | POA: Diagnosis not present

## 2015-05-11 DIAGNOSIS — G8929 Other chronic pain: Secondary | ICD-10-CM | POA: Diagnosis not present

## 2015-05-11 DIAGNOSIS — D500 Iron deficiency anemia secondary to blood loss (chronic): Secondary | ICD-10-CM | POA: Diagnosis not present

## 2015-05-11 DIAGNOSIS — I5032 Chronic diastolic (congestive) heart failure: Secondary | ICD-10-CM

## 2015-05-11 DIAGNOSIS — R040 Epistaxis: Secondary | ICD-10-CM | POA: Diagnosis not present

## 2015-05-11 DIAGNOSIS — K219 Gastro-esophageal reflux disease without esophagitis: Secondary | ICD-10-CM | POA: Diagnosis not present

## 2015-05-11 DIAGNOSIS — I10 Essential (primary) hypertension: Secondary | ICD-10-CM

## 2015-05-11 DIAGNOSIS — F411 Generalized anxiety disorder: Secondary | ICD-10-CM | POA: Diagnosis not present

## 2015-05-11 NOTE — Assessment & Plan Note (Signed)
Tolerable, continue Tylenol prn instead of Norco per patient's request. W/c to go further

## 2015-05-11 NOTE — Assessment & Plan Note (Signed)
Stable, cont Mirtazapine 7.5mg nightly  

## 2015-05-11 NOTE — Assessment & Plan Note (Signed)
12/08/14 Hgb 10.9, continue Vit B12, Fe 03/07/15 Hgb 11.7 05/11/15 update CBC

## 2015-05-11 NOTE — Assessment & Plan Note (Signed)
Baseline creat 2-3, update CMP 

## 2015-05-11 NOTE — Assessment & Plan Note (Signed)
Stable, continue Omeprazole 20mg daily.  

## 2015-05-11 NOTE — Assessment & Plan Note (Signed)
04/20/15 urine culture K. Pneumoniae >100,000c/ml, treated fully with Cipro 500mg  bid x 7 days.

## 2015-05-11 NOTE — Assessment & Plan Note (Signed)
Permissive blood pressure control, continue Carvedilol 3.125mg  bid, Clonidine 0.1mg  bid, Imdur 15mg , Furosemide 40mg 

## 2015-05-11 NOTE — Assessment & Plan Note (Signed)
Nose bleed on and off, not on anticoagulation therapy. Apply ABT oint R+L nostrils bid x 10 days, update CBC

## 2015-05-11 NOTE — Progress Notes (Signed)
8Patient ID: [name], female   DOB: [birth year], 82 y.o.   MRN: [id number]  Location:      Place of Service:  AL Provider:  Lennie Odor Mikle Sternberg NP  GREEN, [name], MD  Patient Care Team: Estill Dooms, MD as PCP - General (Internal Medicine) Juanita Craver, MD as Consulting Physician (Gastroenterology) Shon Hough, MD as Consulting Physician (Ophthalmology) Thornell Sartorius, MD as Consulting Physician (Otolaryngology) Kimberly Roscoe Witts Otho Darner, NP as Nurse Practitioner (Nurse Practitioner) Bo Merino, MD as Consulting Physician (Rheumatology) Suella Broad, MD as Consulting Physician (Physical Medicine and Rehabilitation) Netta Cedars, MD as Consulting Physician (Orthopedic Surgery) Fanny Skates, MD as Consulting Physician (General Surgery) Truitt Merle, MD as Consulting Physician (Hematology) Thea Silversmith, MD as Consulting Physician (Radiation Oncology) Rockwell Germany, RN as Registered Nurse Mauro Kaufmann, RN as Registered Nurse Sylvan Cheese, NP as Nurse Practitioner (Nurse Practitioner) [name], MD as Consulting Physician (Cardiology)  Extended Emergency Contact Information Primary Emergency Contact: [name],[name] Address: [address]          [place] [address] [place] Phone: [phone] Mobile Phone: [phone] Relation: Daughter Secondary Emergency Contact: [name],[name]  [place] Mobile Phone: [phone] Relation: None  Code Status:  DNR Goals of care: Advanced Directive information Advanced Directives 04/13/2015  Does patient have an advance directive? Yes  Type of Paramedic of [place];Out of facility DNR (pink MOST or yellow form)  Does patient want to make changes to advanced directive? No - Patient declined  Copy of advanced directive(s) in chart? Yes     Chief Complaint  Patient presents with  . Urinary Tract Infection    per labs    HPI:  Pt  is a 82 y.o. female seen today for medical management of chronic diseases.  s/p lumpetomy for breast cancer of upper outer quadrant of left female breast. Hx of CHF compensated clinically while on Furosemide 40mg , Kcl, K 4.2 03/07/15.  CKD worsening creat 2.6 12/22/15. Blood pressure is controlled on Carvedilol 3.125mg  bid, Clonidine 0.1mg  bid, Imdur 30mg  daily. Her mood is stable while on Mirtazapine 7.5mg  daily.    Nose bleed on and off, not on anticoagulation therapy. 04/20/15 urine culture K. Pneumoniae >100,000c/ml, treated fully with Cipro 500mg  bid x 7 days.      Past Medical History  Diagnosis Date  . Senile osteoporosis   . Heart murmur   . Small bowel obstruction ([place])   . GERD (gastroesophageal reflux disease)   . Hypertension   . Glaucoma   . Arthritis     Osteoarthritis  . Personal history of fall   . Edema   . Hyperlipidemia   . Diverticulosis of colon (without mention of hemorrhage)   . Other malaise and fatigue   . Hypertonicity of bladder   . Osteoarthrosis, unspecified whether generalized or localized, unspecified site   . Lumbago   . Edema   . Pain in joint, pelvic region and thigh   . Pain in joint, shoulder region   . Flat feet   . Cerebral ischemia 12/19/2012    Microvascular   . Cerebral atrophy 12/19/2012  . Cerebral atherosclerosis 12/19/2012  . RLQ abdominal pain 01/17/2011  . Urine, incontinence, stress female 09/18/2014  . CKD (chronic kidney disease) stage 3, GFR 30-59 ml/min 11/03/2014  . STEMI (ST elevation myocardial infarction) ([place]) 11/04/2014  . Cancer of left breast [facility]) August 2016  . Ischemic cardiomyopathy 11/04/2014  . Unstable  gait 11/04/2014  . Dementia   . Cough    Past Surgical History  Procedure Laterality Date  . Tonsillectomy    . Colon surgery  02/1996    Colectomy, partial for diverticular bleed 90%  . Joint replacement Left 12/2000    Left knee; Dr. Wynelle Link  . Breast lumpectomy Bilateral   . Laparoscopic lysis of adhesions   11/15/10    Exploratory  . Cardiac catheterization N/A 11/08/2014    Procedure: Left Heart Cath and Coronary Angiography;  Surgeon: [name], MD;  Location: [place] [facility];  Service: Cardiovascular;  Laterality: N/A;  . Eye surgery    . Breast lumpectomy with radioactive seed localization Left 03/15/2015    Procedure: LEFT BREAST LUMPECTOMY WITH RADIOACTIVE SEED LOCALIZATION;  Surgeon: Fanny Skates, MD;  Location: [place];  Service: General;  Laterality: Left;    Allergies  Allergen Reactions  . Sulfa Antibiotics Itching      Medication List       This list is accurate as of: 05/11/15 11:28 AM.  Always use your most recent med list.               acetaminophen 325 MG tablet  Commonly known as:  TYLENOL  Take 2 tablets (650 mg total) by mouth every 6 (six) hours as needed for mild pain (or Fever >/= 101).     alum & mag hydroxide-simeth 200-200-20 MG/5ML suspension  Commonly known as:  MAALOX/MYLANTA  Take by mouth. 75ml every 4 hours as needed for indigestion     atorvastatin 20 MG tablet  Commonly known as:  LIPITOR  Take one tablet by mouth once daily for cholesterol     BETIMOL 0.5 % ophthalmic solution  Generic drug:  timolol  Place 1 drop into both eyes every morning.     carvedilol 3.125 MG tablet  Commonly known as:  COREG  Take one tablet by mouth twice daily with a meal     cloNIDine 0.1 MG tablet  Commonly known as:  CATAPRES  Take 0.1 mg by mouth 2 (two) times daily. 1 tab every 8 hours as needed for blood pressure >180/100     Coenzyme Q10 50 MG Tabs  Take by mouth. Take one tablet by mouth at bedtime     ferrous sulfate 325 (65 FE) MG tablet  Take 325 mg by mouth daily with breakfast.     furosemide 40 MG tablet  Commonly known as:  LASIX  Take 1 tablet (40 mg total) by mouth daily.     isosorbide mononitrate 30 MG 24 hr tablet  Commonly known as:  IMDUR  Take 30 mg by mouth at bedtime.     KLOR-CON 10 10 MEQ tablet  Generic drug:   potassium chloride  Take 10 mEq by mouth daily.     latanoprost 0.005 % ophthalmic solution  Commonly known as:  XALATAN  Place 1 drop into both eyes at bedtime.     mirtazapine 7.5 MG tablet  Commonly known as:  REMERON  TAKE 1 TABLET (7.5 MG TOTAL) BY MOUTH AT BEDTIME. NIGHTLY FOR APPETITE     omeprazole 20 MG capsule  Commonly known as:  PRILOSEC  Take 20 mg by mouth daily.     oxybutynin 5 MG 24 hr tablet  Commonly known as:  DITROPAN-XL  Take 5 mg by mouth at bedtime.     polyvinyl alcohol 1.4 % ophthalmic solution  Commonly known as:  LIQUIFILM TEARS  Place 2 drops  into both eyes 2 (two) times daily.     vitamin B-12 1000 MCG tablet  Commonly known as:  CYANOCOBALAMIN  Take 1,000 mcg by mouth 2 (two) times daily. On Mondays,Wednesdays and Fridays        Review of Systems  Constitutional: Negative for fever, chills and diaphoresis.  HENT: Positive for hearing loss. Negative for congestion and ear pain.   Eyes: Negative.  Negative for discharge and redness.  Respiratory: Negative.  Negative for cough and shortness of breath.   Cardiovascular: Negative for chest pain, palpitations and leg swelling (ankles).       Resolved edema BLE  Gastrointestinal: Negative for nausea, vomiting, abdominal pain, diarrhea and constipation.       History of diverticulosis. She denies any abdominal pain.  Genitourinary: Positive for frequency. Negative for dysuria, urgency and flank pain.  Musculoskeletal: Positive for back pain. Negative for myalgias and neck pain.       Flat feet with dropped arches. Pin in the right medial malleolar bones. Chronic back pain. Chronic right shoulder pain. Right knee pain. Uses 4 wheel walker.  Skin: Negative.  Negative for rash.       S/p left upper outer lumpectomy  Neurological: Negative for dizziness, tremors and weakness (Generalized).  Hematological:       History of anemia.  Psychiatric/Behavioral: The patient is not nervous/anxious.      Immunization History  Administered Date(s) Administered  . Influenza Whole 11/19/2011, 12/02/2012  . Influenza-Unspecified 12/06/2013  . PPD Test 11/10/2014  . Pneumococcal Polysaccharide-23 02/18/2009   Pertinent  Health Maintenance Due  Topic Date Due  . PNA vac Low Risk Adult (2 of 2 - PCV13) 02/18/2010  . INFLUENZA VACCINE  09/19/2014  . MAMMOGRAM  10/13/2015  . DEXA SCAN  Completed   Fall Risk  11/17/2014 04/21/2014 12/17/2012  Falls in the past year? Yes Yes Yes  Number falls in past yr: 1 1 1   Injury with Fall? No No Yes  Risk for fall due to : Impaired balance/gait;History of fall(s) - -   Functional Status Survey:    Filed Vitals:   05/11/15 1121  BP: 140/90  Pulse: 70  Temp: 99.2 F (37.3 C)  TempSrc: Tympanic  Resp: 18   There is no weight on file to calculate BMI. Physical Exam  Constitutional: She is oriented to person, place, and time. She appears well-developed and well-nourished. No distress.  HENT:  Head: Normocephalic and atraumatic.  Right Ear: External ear normal.  Left Ear: External ear normal.  Nose: Nose normal.  Mouth/Throat: Oropharynx is clear and moist. No oropharyngeal exudate.  Left nostril erythematous  Eyes: Conjunctivae and EOM are normal. Pupils are equal, round, and reactive to light. Right eye exhibits no discharge. Left eye exhibits no discharge. No scleral icterus.  Neck: Normal range of motion. Neck supple. No JVD present. No thyromegaly present.  Cardiovascular: Normal rate and regular rhythm.   Murmur heard.  Systolic murmur is present with a grade of 2/6  Pulmonary/Chest: Effort normal. No respiratory distress. She has no wheezes. She has rales (sparse). She exhibits no tenderness.  Abdominal: Soft. Bowel sounds are normal. She exhibits no distension. There is no tenderness. There is no rebound.  Musculoskeletal: Normal range of motion. She exhibits no edema (1+  bipedal) or tenderness.  Right knee deformity with  osteoarthritic changes.  Lymphadenopathy:    She has no cervical adenopathy.  Neurological: She is alert and oriented to person, place, and time. She has normal  reflexes. No cranial nerve deficit. She exhibits normal muscle tone. Coordination normal.  04/21/14 MMSE 28/30. Failed clock drawing.   Skin: Skin is warm and dry. No rash noted. She is not diaphoretic. No erythema.  The left upper outer breast s/p lumpectomy  Psychiatric: She has a normal mood and affect. Her behavior is normal. Judgment and thought content normal.    Labs reviewed:  Recent Labs  11/06/14 0340 11/08/14 0316 11/09/14 0230 11/10/14 0331  11/29/14 12/22/14 03/07/15 1321  NA 142 138 137 137  < > 142 140 143  K 3.4* 3.3* 4.3 3.8  < > 3.5 3.7 4.2  CL 109 108 109 108  --   --   --  106  CO2 24 22 21* 21*  --   --   --  24  GLUCOSE 106* 104* 98 95  --   --   --  105*  BUN 37* 26* 23* 22*  < > 32* 22* 24*  CREATININE 2.62* 2.60* 2.57* 2.69*  < > 2.5* 2.6* 2.66*  CALCIUM 8.2* 8.5* 8.6* 8.6*  --   --   --  9.5  MG 2.3 2.2 2.2  --   --   --   --   --   PHOS 4.3 3.8 4.4 4.3  --   --   --   --   < > = values in this interval not displayed.  Recent Labs  10/19/14 0824 11/04/14 1545  11/09/14 0230 11/10/14 0331 11/21/14 03/07/15 1321  AST 15 21  --   --   --  13 16  ALT 8 13*  --   --   --  5* 7*  ALKPHOS 48 55  --   --   --  47 44  BILITOT 0.72 0.7  --   --   --   --  0.7  PROT 6.3* 7.5  --   --   --   --  6.7  ALBUMIN 3.5 4.4  < > 2.8* 2.8*  --  3.8  < > = values in this interval not displayed.  Recent Labs  11/09/14 0230 11/10/14 0331 11/21/14 03/07/15 1321  WBC 8.3 7.7 8.8 5.6  NEUTROABS 5.8 5.3  --  3.3  HGB 9.8* 9.2* 11.0* 11.7*  HCT 31.1* 29.3* 33* 35.9*  MCV 92.3 92.4  --  92.8  PLT 146* 134* 217 183   Lab Results  Component Value Date   TSH 2.227 08/16/2014   Lab Results  Component Value Date   HGBA1C 5.6 11/06/2014   Lab Results  Component Value Date   CHOL 109 11/06/2014   HDL  35* 11/06/2014   LDLCALC 49 11/06/2014   TRIG 124 11/06/2014   CHOLHDL 3.1 11/06/2014    Significant Diagnostic Results in last 30 days:  No results found.  Assessment/Plan  UTI (urinary tract infection) 04/20/15 urine culture K. Pneumoniae >100,000c/ml, treated fully with Cipro 500mg  bid x 7 days.    Epistaxis Nose bleed on and off, not on anticoagulation therapy. Apply ABT oint R+L nostrils bid x 10 days, update CBC   Anemia 12/08/14 Hgb 10.9, continue Vit B12, Fe 03/07/15 Hgb 11.7 05/11/15 update CBC   Anxiety state Stable, cont Mirtazapine 7.5mg  nightly   Chronic diastolic congestive heart failure Compensated clinically, continue Furosemide 40mg , Kcl 84meq, update CMP  CKD (chronic kidney disease) stage 3, GFR 30-59 ml/min Baseline creat 2-3, update CMP  Edema Not apparent BLE, continue Furosemide 40mg  daily, Kcl 11meq  GERD (gastroesophageal reflux disease) Stable, continue Omeprazole 20mg  daily.   Hypertension Permissive blood pressure control, continue Carvedilol 3.125mg  bid, Clonidine 0.1mg  bid, Imdur 15mg , Furosemide 40mg   Lumbago Tolerable, continue Tylenol prn instead of Norco per patient's request. W/c to go further    Family/ staff Communication: continue AL for care needs.   Labs/tests ordered: CBC, CMP

## 2015-05-11 NOTE — Assessment & Plan Note (Signed)
Compensated clinically, continue Furosemide 40mg , Kcl 22meq, update CMP

## 2015-05-11 NOTE — Assessment & Plan Note (Signed)
Not apparent BLE, continue Furosemide 40mg  daily, Kcl 28meq

## 2015-05-16 DIAGNOSIS — N393 Stress incontinence (female) (male): Secondary | ICD-10-CM | POA: Diagnosis not present

## 2015-05-16 DIAGNOSIS — D649 Anemia, unspecified: Secondary | ICD-10-CM | POA: Diagnosis not present

## 2015-05-16 LAB — BASIC METABOLIC PANEL
BUN: 24 mg/dL — AB (ref 4–21)
CREATININE: 2.2 mg/dL — AB (ref 0.5–1.1)
Glucose: 77 mg/dL
POTASSIUM: 3.6 mmol/L (ref 3.4–5.3)
SODIUM: 141 mmol/L (ref 137–147)

## 2015-05-16 LAB — HEPATIC FUNCTION PANEL
ALK PHOS: 44 U/L (ref 25–125)
ALT: 5 U/L — AB (ref 7–35)
AST: 12 U/L — AB (ref 13–35)
Bilirubin, Total: 0.5 mg/dL

## 2015-05-16 LAB — CBC AND DIFFERENTIAL
HCT: 32 % — AB (ref 36–46)
Hemoglobin: 10.5 g/dL — AB (ref 12.0–16.0)
Platelets: 240 10*3/uL (ref 150–399)
WBC: 5.8 10*3/mL

## 2015-05-23 DIAGNOSIS — I10 Essential (primary) hypertension: Secondary | ICD-10-CM | POA: Diagnosis not present

## 2015-05-23 LAB — CBC AND DIFFERENTIAL
HCT: 35 % — AB (ref 36–46)
Hemoglobin: 11.3 g/dL — AB (ref 12.0–16.0)
PLATELETS: 192 10*3/uL (ref 150–399)
WBC: 4.6 10^3/mL

## 2015-05-23 LAB — HEPATIC FUNCTION PANEL
ALK PHOS: 44 U/L (ref 25–125)
ALT: 6 U/L — AB (ref 7–35)
AST: 13 U/L (ref 13–35)
Bilirubin, Total: 0.5 mg/dL

## 2015-05-23 LAB — BASIC METABOLIC PANEL
BUN: 25 mg/dL — AB (ref 4–21)
CREATININE: 2.3 mg/dL — AB (ref 0.5–1.1)
Glucose: 79 mg/dL
POTASSIUM: 4.1 mmol/L (ref 3.4–5.3)
SODIUM: 142 mmol/L (ref 137–147)

## 2015-05-31 DIAGNOSIS — M1711 Unilateral primary osteoarthritis, right knee: Secondary | ICD-10-CM | POA: Diagnosis not present

## 2015-07-06 ENCOUNTER — Encounter: Payer: Self-pay | Admitting: Nurse Practitioner

## 2015-07-06 ENCOUNTER — Non-Acute Institutional Stay: Payer: Medicare Other | Admitting: Nurse Practitioner

## 2015-07-06 DIAGNOSIS — I5021 Acute systolic (congestive) heart failure: Secondary | ICD-10-CM

## 2015-07-06 DIAGNOSIS — I5032 Chronic diastolic (congestive) heart failure: Secondary | ICD-10-CM | POA: Diagnosis not present

## 2015-07-06 DIAGNOSIS — I10 Essential (primary) hypertension: Secondary | ICD-10-CM | POA: Diagnosis not present

## 2015-07-06 DIAGNOSIS — D500 Iron deficiency anemia secondary to blood loss (chronic): Secondary | ICD-10-CM

## 2015-07-06 DIAGNOSIS — F411 Generalized anxiety disorder: Secondary | ICD-10-CM | POA: Diagnosis not present

## 2015-07-06 DIAGNOSIS — M25561 Pain in right knee: Secondary | ICD-10-CM

## 2015-07-06 DIAGNOSIS — R609 Edema, unspecified: Secondary | ICD-10-CM

## 2015-07-06 DIAGNOSIS — N183 Chronic kidney disease, stage 3 unspecified: Secondary | ICD-10-CM

## 2015-07-06 NOTE — Assessment & Plan Note (Signed)
Not apparent BLE, continue Furosemide 40mg  daily, Kcl 80meq

## 2015-07-06 NOTE — Assessment & Plan Note (Signed)
Compensated clinically, continue Furosemide 40mg , Kcl 19meq, 05/23/15 Na 142, K 4.1, Bun 25, creat 2.33

## 2015-07-06 NOTE — Assessment & Plan Note (Signed)
Stable, cont Mirtazapine 7.5mg nightly  

## 2015-07-06 NOTE — Assessment & Plan Note (Signed)
Permissive blood pressure control, continue Carvedilol 3.125mg  bid, Clonidine 0.1mg  bid, Imdur 15mg , Furosemide 40mg 

## 2015-07-06 NOTE — Progress Notes (Signed)
Patient ID: [name], female   DOB: 12/09/1925, 82 y.o.   MRN: [id number]  Location:  [place] Room Number: [place] of Service:  AL Provider:  Lennie Odor Arif Amendola NP  GREEN, [name], MD  Patient Care Team: Estill Dooms, MD as PCP - General (Internal Medicine) Juanita Craver, MD as Consulting Physician (Gastroenterology) Shon Hough, MD as Consulting Physician (Ophthalmology) Thornell Sartorius, MD as Consulting Physician (Otolaryngology) Pender Kadasia Kassing Otho Darner, NP as Nurse Practitioner (Nurse Practitioner) Bo Merino, MD as Consulting Physician (Rheumatology) Suella Broad, MD as Consulting Physician (Physical Medicine and Rehabilitation) Netta Cedars, MD as Consulting Physician (Orthopedic Surgery) Fanny Skates, MD as Consulting Physician (General Surgery) Truitt Merle, MD as Consulting Physician (Hematology) Thea Silversmith, MD as Consulting Physician (Radiation Oncology) Rockwell Germany, RN as Registered Nurse Mauro Kaufmann, RN as Registered Nurse Sylvan Cheese, NP as Nurse Practitioner (Nurse Practitioner) [name], MD as Consulting Physician (Cardiology)  Extended Emergency Contact Information Primary Emergency Contact: [name] Address: [address]          [place] [address] Johnnette Litter of [place] Phone: [phone] Mobile Phone: [phone] Relation: Daughter Secondary Emergency Contact: [name]  [place] Mobile Phone: [phone] Relation: None  Code Status:  DNR Goals of care: Advanced Directive information Advanced Directives 07/06/2015  Does patient have an advance directive? Yes  Type of Paramedic of [place];Out of facility DNR (pink MOST or yellow form)  Does patient want to make changes to advanced directive? -  Copy of advanced directive(s) in chart? -     Chief Complaint  Patient presents with  . Medical Management of  Chronic Issues    Routine Visit    HPI:  Pt is a 82 y.o. female seen today for medical management of chronic diseases.  s/p lumpetomy for breast cancer of upper outer quadrant of left female breast. Hx of CHF compensated clinically while on Furosemide 40mg , Kcl, K 4.1 4/4//17.  CKD  creat 2.6 12/22/15, 2.3 05/23/15.  Blood pressure is controlled on Carvedilol 3.125mg  bid, Clonidine 0.1mg  bid, Imdur 30mg  daily. Her mood is stable while on Mirtazapine 7.5mg  daily.    Nose bleed on and off, not on anticoagulation therapy. 04/20/15 urine culture K. Pneumoniae >100,000c/ml, treated fully with Cipro 500mg  bid x 7 days.    Past Medical History  Diagnosis Date  . Senile osteoporosis   . Heart murmur   . Small bowel obstruction ([place])   . GERD (gastroesophageal reflux disease)   . Hypertension   . Glaucoma   . Arthritis     Osteoarthritis  . Personal history of fall   . Edema   . Hyperlipidemia   . Diverticulosis of colon (without mention of hemorrhage)   . Other malaise and fatigue   . Hypertonicity of bladder   . Osteoarthrosis, unspecified whether generalized or localized, unspecified site   . Lumbago   . Edema   . Pain in joint, pelvic region and thigh   . Pain in joint, shoulder region   . Flat feet   . Cerebral ischemia 12/19/2012    Microvascular   . Cerebral atrophy 12/19/2012  . Cerebral atherosclerosis 12/19/2012  . RLQ abdominal pain 01/17/2011  . Urine, incontinence, stress female 09/18/2014  . CKD (chronic kidney disease) stage 3, GFR 30-59 ml/min 11/03/2014  . STEMI (ST elevation myocardial infarction) ([place]) 11/04/2014  . Cancer of left breast [facility]) August 2016  . Ischemic  cardiomyopathy 11/04/2014  . Unstable gait 11/04/2014  . Dementia   . Cough    Past Surgical History  Procedure Laterality Date  . Tonsillectomy    . Colon surgery  02/1996    Colectomy, partial for diverticular bleed 90%  . Joint replacement Left 12/2000    Left knee; Dr. Wynelle Link  . Breast lumpectomy  Bilateral   . Laparoscopic lysis of adhesions  11/15/10    Exploratory  . Cardiac catheterization N/A 11/08/2014    Procedure: Left Heart Cath and Coronary Angiography;  Surgeon: [name], MD;  Location: [facility];  Service: Cardiovascular;  Laterality: N/A;  . Eye surgery    . Breast lumpectomy with radioactive seed localization Left 03/15/2015    Procedure: LEFT BREAST LUMPECTOMY WITH RADIOACTIVE SEED LOCALIZATION;  Surgeon: Fanny Skates, MD;  Location: [facility];  Service: General;  Laterality: Left;    Allergies  Allergen Reactions  . Sulfa Antibiotics Itching      Medication List       This list is accurate as of: 07/06/15  1:29 PM.  Always use your most recent med list.               acetaminophen 325 MG tablet  Commonly known as:  TYLENOL  Take 650 mg by mouth 2 (two) times daily. Take two tablets by mouth every 6 hours as needed for mild pain or fever     alum & mag hydroxide-simeth 200-200-20 MG/5ML suspension  Commonly known as:  MAALOX/MYLANTA  Take by mouth. 49ml every 4 hours as needed for indigestion     atorvastatin 20 MG tablet  Commonly known as:  LIPITOR  Take one tablet by mouth once daily for cholesterol     BETIMOL 0.5 % ophthalmic solution  Generic drug:  timolol  Place 1 drop into both eyes every morning.     carvedilol 3.125 MG tablet  Commonly known as:  COREG  Take one tablet by mouth twice daily with a meal     cloNIDine 0.1 MG tablet  Commonly known as:  CATAPRES  Take 0.1 mg by mouth 2 (two) times daily. 1 tab every 8 hours as needed for blood pressure >180/100     Coenzyme Q10 50 MG Tabs  Take one tablet by mouth at bedtime     ferrous sulfate 325 (65 FE) MG tablet  Take 325 mg by mouth daily with breakfast.     furosemide 40 MG tablet  Commonly known as:  LASIX  Take 1 tablet (40 mg total) by mouth daily.     isosorbide mononitrate 30 MG 24 hr tablet  Commonly known as:  IMDUR  Take 30 mg by mouth at bedtime.      KLOR-CON 10 10 MEQ tablet  Generic drug:  potassium chloride  Take 10 mEq by mouth daily.     latanoprost 0.005 % ophthalmic solution  Commonly known as:  XALATAN  Place 1 drop into both eyes at bedtime.     mirtazapine 7.5 MG tablet  Commonly known as:  REMERON  TAKE 1 TABLET (7.5 MG TOTAL) BY MOUTH AT BEDTIME. NIGHTLY FOR APPETITE     omeprazole 20 MG capsule  Commonly known as:  PRILOSEC  Take 20 mg by mouth daily.     oxybutynin 5 MG 24 hr tablet  Commonly known as:  DITROPAN-XL  Take 5 mg by mouth at bedtime.     polyvinyl alcohol 1.4 % ophthalmic solution  Commonly known as:  LIQUIFILM TEARS  Place 2 drops into both eyes 2 (two) times daily.     vitamin B-12 1000 MCG tablet  Commonly known as:  CYANOCOBALAMIN  Take 1 tablet by mouth twice daily On Mondays,Wednesdays and Fridays        Review of Systems  Constitutional: Negative for fever, chills and diaphoresis.  HENT: Positive for hearing loss. Negative for congestion and ear pain.   Eyes: Negative.  Negative for discharge and redness.  Respiratory: Negative.  Negative for cough and shortness of breath.   Cardiovascular: Negative for chest pain, palpitations and leg swelling (ankles).       Resolved edema BLE  Gastrointestinal: Negative for nausea, vomiting, abdominal pain, diarrhea and constipation.       History of diverticulosis. She denies any abdominal pain.  Genitourinary: Positive for frequency. Negative for dysuria, urgency and flank pain.  Musculoskeletal: Positive for back pain. Negative for myalgias and neck pain.       Flat feet with dropped arches. Pin in the right medial malleolar bones. Chronic back pain. Chronic right shoulder pain. Right knee pain. Uses 4 wheel walker.  Skin: Negative.  Negative for rash.       S/p left upper outer lumpectomy  Neurological: Negative for dizziness, tremors and weakness (Generalized).  Hematological:       History of anemia.  Psychiatric/Behavioral: The patient  is not nervous/anxious.     Immunization History  Administered Date(s) Administered  . Influenza Whole 11/19/2011, 12/02/2012  . Influenza-Unspecified 12/06/2013  . PPD Test 11/10/2014  . Pneumococcal Polysaccharide-23 02/18/2009   Pertinent  Health Maintenance Due  Topic Date Due  . PNA vac Low Risk Adult (2 of 2 - PCV13) 02/18/2010  . INFLUENZA VACCINE  09/19/2015  . MAMMOGRAM  10/13/2015  . DEXA SCAN  Completed   Fall Risk  11/17/2014 04/21/2014 12/17/2012  Falls in the past year? Yes Yes Yes  Number falls in past yr: 1 1 1   Injury with Fall? No No Yes  Risk for fall due to : Impaired balance/gait;History of fall(s) - -   Functional Status Survey:    Filed Vitals:   07/06/15 0935  BP: 150/80  Pulse: 70  Temp: 98.6 F (37 C)  TempSrc: Oral  Resp: 18  Height: 5\' 4"  (1.626 m)  Weight: 128 lb 9.6 oz (58.333 kg)   Body mass index is 22.06 kg/(m^2). Physical Exam  Constitutional: She is oriented to person, place, and time. She appears well-developed and well-nourished. No distress.  HENT:  Head: Normocephalic and atraumatic.  Right Ear: External ear normal.  Left Ear: External ear normal.  Nose: Nose normal.  Mouth/Throat: Oropharynx is clear and moist. No oropharyngeal exudate.  Left nostril erythematous  Eyes: Conjunctivae and EOM are normal. Pupils are equal, round, and reactive to light. Right eye exhibits no discharge. Left eye exhibits no discharge. No scleral icterus.  Neck: Normal range of motion. Neck supple. No JVD present. No thyromegaly present.  Cardiovascular: Normal rate and regular rhythm.   Murmur heard.  Systolic murmur is present with a grade of 2/6  3/6  Pulmonary/Chest: Effort normal. No respiratory distress. She has no wheezes. She has rales (sparse). She exhibits no tenderness.  Abdominal: Soft. Bowel sounds are normal. She exhibits no distension. There is no tenderness. There is no rebound.  Musculoskeletal: Normal range of motion. She exhibits  no edema (1+  bipedal) or tenderness.  Right knee deformity with osteoarthritic changes.  Lymphadenopathy:    She has no  cervical adenopathy.  Neurological: She is alert and oriented to person, place, and time. She has normal reflexes. No cranial nerve deficit. She exhibits normal muscle tone. Coordination normal.  04/21/14 MMSE 28/30. Failed clock drawing.   Skin: Skin is warm and dry. No rash noted. She is not diaphoretic. No erythema.  The left upper outer breast s/p lumpectomy  Psychiatric: She has a normal mood and affect. Her behavior is normal. Judgment and thought content normal.    Labs reviewed:  Recent Labs  11/06/14 0340 11/08/14 0316 11/09/14 0230 11/10/14 0331  03/07/15 1321 05/16/15 05/23/15  NA 142 138 137 137  < > 143 141 142  K 3.4* 3.3* 4.3 3.8  < > 4.2 3.6 4.1  CL 109 108 109 108  --  106  --   --   CO2 24 22 21* 21*  --  24  --   --   GLUCOSE 106* 104* 98 95  --  105*  --   --   BUN 37* 26* 23* 22*  < > 24* 24* 25*  CREATININE 2.62* 2.60* 2.57* 2.69*  < > 2.66* 2.2* 2.3*  CALCIUM 8.2* 8.5* 8.6* 8.6*  --  9.5  --   --   MG 2.3 2.2 2.2  --   --   --   --   --   PHOS 4.3 3.8 4.4 4.3  --   --   --   --   < > = values in this interval not displayed.  Recent Labs  10/19/14 0824 11/04/14 1545  11/09/14 0230 11/10/14 0331  03/07/15 1321 05/16/15 05/23/15  AST 15 21  --   --   --   < > 16 12* 13  ALT 8 13*  --   --   --   < > 7* 5* 6*  ALKPHOS 48 55  --   --   --   < > 44 44 44  BILITOT 0.72 0.7  --   --   --   --  0.7  --   --   PROT 6.3* 7.5  --   --   --   --  6.7  --   --   ALBUMIN 3.5 4.4  < > 2.8* 2.8*  --  3.8  --   --   < > = values in this interval not displayed.  Recent Labs  11/09/14 0230 11/10/14 0331  03/07/15 1321 05/16/15 05/23/15  WBC 8.3 7.7  < > 5.6 5.8 4.6  NEUTROABS 5.8 5.3  --  3.3  --   --   HGB 9.8* 9.2*  < > 11.7* 10.5* 11.3*  HCT 31.1* 29.3*  < > 35.9* 32* 35*  MCV 92.3 92.4  --  92.8  --   --   PLT 146* 134*  < > 183 240  192  < > = values in this interval not displayed. Lab Results  Component Value Date   TSH 2.227 08/16/2014   Lab Results  Component Value Date   HGBA1C 5.6 11/06/2014   Lab Results  Component Value Date   CHOL 109 11/06/2014   HDL 35* 11/06/2014   LDLCALC 49 11/06/2014   TRIG 124 11/06/2014   CHOLHDL 3.1 11/06/2014    Significant Diagnostic Results in last 30 days:  No results found.  Assessment/Plan  Acute systolic congestive heart failure ([place]) Compensated clinically, continue Furosemide 40mg , Kcl 39meq, 05/23/15 Na 142, K 4.1, Bun 25, creat 2.33  Anemia 12/08/14 Hgb 10.9, continue Vit B12, Fe 03/07/15 Hgb 11.7 05/23/15 Hgb 11.3        Anxiety state Stable, cont Mirtazapine 7.5mg  nightly    Chronic diastolic congestive heart failure (HCC) Compensated clinically, continue Furosemide 40mg , Kcl 75meq, 05/23/15 Na 142, K 4.1, Bun 25, creat 2.33   CKD (chronic kidney disease) stage 3, GFR 30-59 ml/min Baseline creat 2-3, 05/23/15 Na 142, K 4.1, Bun 25, creat 2.33    Edema Not apparent BLE, continue Furosemide 40mg  daily, Kcl 70meq   Hypertension Permissive blood pressure control, continue Carvedilol 3.125mg  bid, Clonidine 0.1mg  bid, Imdur 15mg , Furosemide 40mg   Right knee pain S/p left knee replacement. R knee pain, f/u Ortho in 07/11/15, s/p inj and fluid removal.     Family/ staff Communication: continue AL for care needs.   Labs/tests ordered: none

## 2015-07-06 NOTE — Assessment & Plan Note (Signed)
Compensated clinically, continue Furosemide 40mg , Kcl 62meq, 05/23/15 Na 142, K 4.1, Bun 25, creat 2.33

## 2015-07-06 NOTE — Assessment & Plan Note (Signed)
Baseline creat 2-3, 05/23/15 Na 142, K 4.1, Bun 25, creat 2.33

## 2015-07-06 NOTE — Assessment & Plan Note (Signed)
12/08/14 Hgb 10.9, continue Vit B12, Fe 03/07/15 Hgb 11.7 05/23/15 Hgb 11.3

## 2015-07-06 NOTE — Assessment & Plan Note (Signed)
S/p left knee replacement. R knee pain, f/u Ortho in 07/11/15, s/p inj and fluid removal.

## 2015-07-11 DIAGNOSIS — M1711 Unilateral primary osteoarthritis, right knee: Secondary | ICD-10-CM | POA: Diagnosis not present

## 2015-07-13 ENCOUNTER — Non-Acute Institutional Stay: Payer: Medicare Other | Admitting: Internal Medicine

## 2015-07-13 ENCOUNTER — Encounter: Payer: Self-pay | Admitting: Internal Medicine

## 2015-07-13 VITALS — BP 144/84 | HR 67 | Temp 97.4°F | Ht 64.0 in | Wt 133.0 lb

## 2015-07-13 DIAGNOSIS — M25561 Pain in right knee: Secondary | ICD-10-CM

## 2015-07-13 DIAGNOSIS — N393 Stress incontinence (female) (male): Secondary | ICD-10-CM | POA: Diagnosis not present

## 2015-07-13 DIAGNOSIS — N183 Chronic kidney disease, stage 3 unspecified: Secondary | ICD-10-CM

## 2015-07-13 DIAGNOSIS — D500 Iron deficiency anemia secondary to blood loss (chronic): Secondary | ICD-10-CM | POA: Diagnosis not present

## 2015-07-13 DIAGNOSIS — E785 Hyperlipidemia, unspecified: Secondary | ICD-10-CM

## 2015-07-13 DIAGNOSIS — I5032 Chronic diastolic (congestive) heart failure: Secondary | ICD-10-CM | POA: Diagnosis not present

## 2015-07-13 DIAGNOSIS — R609 Edema, unspecified: Secondary | ICD-10-CM

## 2015-07-13 DIAGNOSIS — I10 Essential (primary) hypertension: Secondary | ICD-10-CM

## 2015-07-13 NOTE — Progress Notes (Signed)
Patient ID: [name], female   DOB: [birth year], 82 y.o.   MRN: [id number]    [place] Room Number: 037  Place of Service: Clinic (12)     Allergies  Allergen Reactions  . Sulfa Antibiotics Itching    Chief Complaint  Patient presents with  . Medical Management of Chronic Issues    medication management blood pressure, CHF, CKD    HPI:  Iron deficiency anemia due to chronic blood loss - Hemoglobin 11.3 in April 2017. Steadily improving.  Chronic diastolic congestive heart failure (HCC) - appears compensated at present on current medications  Edema, unspecified type - resolved  Essential hypertension - controlled  CKD (chronic kidney disease) stage 3, GFR 30-59 ml/min - stable    Hyperlipidemia - needs recheck of lab  Right knee pain - PA at Dr. Gilberto Better office the joint aspiration and injection of steroid medication 2 days ago. Patient says pains are not much better. She was told that joint replacement would probably be necessary if she wanted something more permanent done. She does not want any surgery on this knee unless "absolutely necessary".  Urine, incontinence, stress female - despite use of Ditropan, patient continues to have urge incontinence. She is on furosemide which was increased in dosage last fall 2016 when she had increased problems with congestive heart failure.    Medications: Patient's Medications  New Prescriptions   No medications on file  Previous Medications   ACETAMINOPHEN (TYLENOL) 325 MG TABLET    Take 650 mg by mouth 2 (two) times daily. Take two tablets by mouth every 6 hours as needed for mild pain or fever   ALUM & MAG HYDROXIDE-SIMETH (MAALOX/MYLANTA) 200-200-20 MG/5ML SUSPENSION    Take by mouth. 9m every 4 hours as needed for indigestion   ATORVASTATIN (LIPITOR) 20 MG TABLET    Take one tablet by mouth once daily for cholesterol   BETIMOL 0.5 % OPHTHALMIC SOLUTION    Place 1 drop into both eyes  every morning.    CARVEDILOL (COREG) 3.125 MG TABLET    Take one tablet by mouth twice daily with a meal   CLONIDINE (CATAPRES) 0.1 MG TABLET    Take 0.1 mg by mouth 2 (two) times daily. 1 tab every 8 hours as needed for blood pressure >180/100   COENZYME Q10 50 MG TABS    Take one tablet by mouth at bedtime   FERROUS SULFATE 325 (65 FE) MG TABLET    Take 325 mg by mouth daily with breakfast.   FUROSEMIDE (LASIX) 40 MG TABLET    Take 1 tablet (40 mg total) by mouth daily.   ISOSORBIDE MONONITRATE (IMDUR) 30 MG 24 HR TABLET    Take 30 mg by mouth at bedtime.   KLOR-CON 10 10 MEQ TABLET    Take 10 mEq by mouth daily.   LATANOPROST (XALATAN) 0.005 % OPHTHALMIC SOLUTION    Place 1 drop into both eyes at bedtime.    MIRTAZAPINE (REMERON) 7.5 MG TABLET    TAKE 1 TABLET (7.5 MG TOTAL) BY MOUTH AT BEDTIME. NIGHTLY FOR APPETITE   OMEPRAZOLE (PRILOSEC) 20 MG CAPSULE    Take 20 mg by mouth daily.   OXYBUTYNIN (DITROPAN-XL) 5 MG 24 HR TABLET    Take 5 mg by mouth at bedtime.   POLYVINYL ALCOHOL (LIQUIFILM TEARS) 1.4 % OPHTHALMIC SOLUTION    Place 2 drops into both eyes 2 (two) times daily.   VITAMIN B-12 (CYANOCOBALAMIN) 1000 MCG TABLET  Take 1 tablet by mouth twice daily On Mondays,Wednesdays and Fridays  Modified Medications   No medications on file  Discontinued Medications   No medications on file     Review of Systems  Constitutional: Negative for fever, chills and diaphoresis.  HENT: Positive for hearing loss. Negative for congestion and ear pain.   Eyes: Negative.  Negative for discharge and redness.  Respiratory: Negative.  Negative for cough and shortness of breath.   Cardiovascular: Negative for chest pain, palpitations and leg swelling (ankles).       Resolved edema BLE. History of systolic heart failure.  Gastrointestinal: Negative for nausea, vomiting, abdominal pain, diarrhea and constipation.       History of diverticulosis. She denies any abdominal pain.  Genitourinary: Positive  for frequency. Negative for dysuria, urgency and flank pain.  Musculoskeletal: Positive for back pain. Negative for myalgias and neck pain.       Flat feet with dropped arches. Pin in the right medial malleolar bones. Chronic back pain. Chronic right shoulder pain. Right knee pain. Uses 4 wheel walker.  Skin: Negative.  Negative for rash.       S/p left upper outer lumpectomy  Neurological: Negative for dizziness, tremors and weakness (Generalized).  Hematological:       History of anemia.  Psychiatric/Behavioral: The patient is not nervous/anxious.     Filed Vitals:   07/13/15 1341  BP: 144/84  Pulse: 67  Temp: 97.4 F (36.3 C)  TempSrc: Oral  Height: '5\' 4"'$  (1.626 m)  Weight: 133 lb (60.328 kg)  SpO2: 92%   Wt Readings from Last 3 Encounters:  07/13/15 133 lb (60.328 kg)  07/06/15 128 lb 9.6 oz (58.333 kg)  03/15/15 129 lb 5 oz (58.656 kg)    Body mass index is 22.82 kg/(m^2).  Physical Exam  Constitutional: She is oriented to person, place, and time. She appears well-developed and well-nourished. No distress.  HENT:  Head: Normocephalic and atraumatic.  Right Ear: External ear normal.  Left Ear: External ear normal.  Nose: Nose normal.  Mouth/Throat: Oropharynx is clear and moist. No oropharyngeal exudate.  Left nostril erythematous  Eyes: Conjunctivae and EOM are normal. Pupils are equal, round, and reactive to light. Right eye exhibits no discharge. Left eye exhibits no discharge. No scleral icterus.  Neck: Normal range of motion. Neck supple. No JVD present. No thyromegaly present.  Cardiovascular: Normal rate and regular rhythm.   Murmur heard.  Systolic murmur is present with a grade of 2/6  Pulmonary/Chest: Effort normal. No respiratory distress. She has no wheezes. She has rales (sparse). She exhibits no tenderness.  Abdominal: Soft. Bowel sounds are normal. She exhibits no distension. There is no tenderness. There is no rebound.  Musculoskeletal: Normal  range of motion. She exhibits no edema (1+  bipedal) or tenderness.  Right knee deformity with osteoarthritic changes.  Lymphadenopathy:    She has no cervical adenopathy.  Neurological: She is alert and oriented to person, place, and time. She has normal reflexes. No cranial nerve deficit. She exhibits normal muscle tone. Coordination normal.  04/21/14 MMSE 28/30. Failed clock drawing.  Skin: Skin is warm and dry. No rash noted. She is not diaphoretic. No erythema.  The left upper outer breast s/p lumpectomy  Psychiatric: She has a normal mood and affect. Her behavior is normal. Judgment and thought content normal.     Labs reviewed: Lab Summary Latest Ref Rng 05/23/2015 05/16/2015 03/07/2015  Hemoglobin 12.0 - 16.0 g/dL 11.3(A) 10.5(A) 11.7(L)  Hematocrit 36 - 46 % 35(A) 32(A) 35.9(L)  White count - 4.6 5.8 5.6  Platelet count 150 - 399 K/L 192 240 183  Sodium 137 - 147 mmol/L 142 141 143  Potassium 3.4 - 5.3 mmol/L 4.1 3.6 4.2  Calcium 8.9 - 10.3 mg/dL (None) (None) 9.5  Phosphorus - (None) (None) (None)  Creatinine 0.5 - 1.1 mg/dL 2.3(A) 2.2(A) 2.66(H)  AST 13 - 35 U/L 13 12(A) 16  Alk Phos 25 - 125 U/L 44 44 44  Bilirubin 0.3 - 1.2 mg/dL (None) (None) 0.7  Glucose - 79 77 105(H)  Cholesterol - (None) (None) (None)  HDL cholesterol - (None) (None) (None)  Triglycerides - (None) (None) (None)  LDL Direct - (None) (None) (None)  LDL Calc - (None) (None) (None)  Total protein 6.5 - 8.1 g/dL (None) (None) 6.7  Albumin 3.5 - 5.0 g/dL (None) (None) 3.8   Lab Results  Component Value Date   TSH 2.227 08/16/2014   Lab Results  Component Value Date   BUN 25* 05/23/2015   BUN 24* 05/16/2015   BUN 24* 03/07/2015   Lab Results  Component Value Date   CREATININE 2.3* 05/23/2015   CREATININE 2.2* 05/16/2015   CREATININE 2.66* 03/07/2015   Lab Results  Component Value Date   HGBA1C 5.6 11/06/2014   HGBA1C 6.1* 08/16/2014       Assessment/Plan  1. Iron deficiency anemia  due to chronic blood loss Discontinued ferrous sulfate  2. Chronic diastolic congestive heart failure (HCC) Reduce furosemide 20 mg daily  3. Edema, unspecified type Resolved  4. Essential hypertension Controlled  5. CKD (chronic kidney disease) stage 3, GFR 30-59 ml/min -BMP in June 2017  6. Hyperlipidemia -Lipid panel in June 2017  7. Right knee pain -Continue see orthopedist, Dr. Veverly Fells  8. Urine, incontinence, stress female Rather than change the oxybutynin, elected to reduce furosemide to 20 mg daily area

## 2015-07-20 DIAGNOSIS — I10 Essential (primary) hypertension: Secondary | ICD-10-CM | POA: Diagnosis not present

## 2015-07-20 DIAGNOSIS — D500 Iron deficiency anemia secondary to blood loss (chronic): Secondary | ICD-10-CM | POA: Diagnosis not present

## 2015-07-20 DIAGNOSIS — R609 Edema, unspecified: Secondary | ICD-10-CM | POA: Diagnosis not present

## 2015-07-20 LAB — LIPID PANEL
Cholesterol: 108 mg/dL (ref 0–200)
HDL: 45 mg/dL (ref 35–70)
LDL CALC: 39 mg/dL
Triglycerides: 121 mg/dL (ref 40–160)

## 2015-07-20 LAB — CBC AND DIFFERENTIAL
HEMATOCRIT: 31 % — AB (ref 36–46)
Hemoglobin: 10.3 g/dL — AB (ref 12.0–16.0)
PLATELETS: 178 10*3/uL (ref 150–399)
WBC: 5.3 10*3/mL

## 2015-07-20 LAB — BASIC METABOLIC PANEL
BUN: 85 mg/dL — AB (ref 4–21)
Creatinine: 2.2 mg/dL — AB (ref <=1.1)
GLUCOSE: 85 mg/dL
Potassium: 4.1 mmol/L (ref 3.4–5.3)
SODIUM: 142 mmol/L (ref 137–147)

## 2015-07-27 ENCOUNTER — Other Ambulatory Visit: Payer: Self-pay | Admitting: *Deleted

## 2015-08-08 DIAGNOSIS — M1711 Unilateral primary osteoarthritis, right knee: Secondary | ICD-10-CM | POA: Diagnosis not present

## 2015-09-11 DIAGNOSIS — M25461 Effusion, right knee: Secondary | ICD-10-CM | POA: Diagnosis not present

## 2015-09-11 DIAGNOSIS — M1711 Unilateral primary osteoarthritis, right knee: Secondary | ICD-10-CM | POA: Diagnosis not present

## 2015-09-11 DIAGNOSIS — M25561 Pain in right knee: Secondary | ICD-10-CM | POA: Diagnosis not present

## 2015-09-20 ENCOUNTER — Encounter: Payer: Self-pay | Admitting: Nurse Practitioner

## 2015-09-20 ENCOUNTER — Non-Acute Institutional Stay: Payer: Medicare Other | Admitting: Nurse Practitioner

## 2015-09-20 DIAGNOSIS — N183 Chronic kidney disease, stage 3 unspecified: Secondary | ICD-10-CM

## 2015-09-20 DIAGNOSIS — F411 Generalized anxiety disorder: Secondary | ICD-10-CM

## 2015-09-20 DIAGNOSIS — I5032 Chronic diastolic (congestive) heart failure: Secondary | ICD-10-CM | POA: Diagnosis not present

## 2015-09-20 DIAGNOSIS — D631 Anemia in chronic kidney disease: Secondary | ICD-10-CM

## 2015-09-20 DIAGNOSIS — R609 Edema, unspecified: Secondary | ICD-10-CM

## 2015-09-20 DIAGNOSIS — N189 Chronic kidney disease, unspecified: Secondary | ICD-10-CM | POA: Diagnosis not present

## 2015-09-20 DIAGNOSIS — K219 Gastro-esophageal reflux disease without esophagitis: Secondary | ICD-10-CM | POA: Diagnosis not present

## 2015-09-20 DIAGNOSIS — I10 Essential (primary) hypertension: Secondary | ICD-10-CM | POA: Diagnosis not present

## 2015-09-20 NOTE — Progress Notes (Signed)
Location:   [place] Room Number:   Place of Service:   [address] Provider:  Anjalee Cope  NP   Patient Care Team: Estill Dooms, MD as PCP - General (Internal Medicine) Juanita Craver, MD as Consulting Physician (Gastroenterology) Shon Hough, MD as Consulting Physician (Ophthalmology) Thornell Sartorius, MD as Consulting Physician (Otolaryngology) [place] Hser [place] Otho Darner, NP as Nurse Practitioner (Nurse Practitioner) Bo Merino, MD as Consulting Physician (Rheumatology) Suella Broad, MD as Consulting Physician (Physical Medicine and Rehabilitation) Netta Cedars, MD as Consulting Physician (Orthopedic Surgery) Fanny Skates, MD as Consulting Physician (General Surgery) Truitt Merle, MD as Consulting Physician (Hematology) Thea Silversmith, MD as Consulting Physician (Radiation Oncology) Rockwell Germany, RN as Registered Nurse Mauro Kaufmann, RN as Registered Nurse Sylvan Cheese, NP as Nurse Practitioner (Nurse Practitioner) [name], MD as Consulting Physician (Cardiology)  Extended Emergency Contact Information Primary Emergency Contact: [name],[name] Address: [address]          [place] [address] Johnnette Litter of [place] Phone: [phone] Mobile Phone: [phone] Relation: Daughter Secondary Emergency Contact: [name],[name]  [place] Mobile Phone: [phone] Relation: None  Code Status:  DNR Goals of care: Advanced Directive information Advanced Directives 09/20/2015  Does patient have an advance directive? Yes  Type of Paramedic of [place];Out of facility DNR (pink MOST or yellow form)  Does patient want to make changes to advanced directive? No - Patient declined  Copy of advanced directive(s) in chart? Yes     Chief Complaint  Patient presents with  . Medical Management of Chronic Issues    HPI:  Pt is a 82 y.o. female seen today for medical  management of chronic diseases.      Hx of CHF compensated clinically while on Furosemide 40mg , Kcl, K 4.1 4/4//17.  CKD  creat 2.6 12/22/15, 2.3 05/23/15.  Blood pressure is controlled on Carvedilol 3.125mg  bid, Clonidine 0.1mg  bid, Imdur 30mg  daily. Her mood is stable while on Mirtazapine 7.5mg  daily.   Past Medical History:  Diagnosis Date  . Arthritis    Osteoarthritis  . Cancer of left breast [facility]) August 2016  . Cerebral atherosclerosis 12/19/2012  . Cerebral atrophy 12/19/2012  . Cerebral ischemia 12/19/2012   Microvascular   . CKD (chronic kidney disease) stage 3, GFR 30-59 ml/min 11/03/2014  . Cough   . Dementia   . Diverticulosis of colon (without mention of hemorrhage)   . Edema   . Edema   . Flat feet   . GERD (gastroesophageal reflux disease)   . Glaucoma   . Heart murmur   . Hyperlipidemia   . Hypertension   . Hypertonicity of bladder   . Ischemic cardiomyopathy 11/04/2014  . Lumbago   . Osteoarthrosis, unspecified whether generalized or localized, unspecified site   . Other malaise and fatigue   . Pain in joint, pelvic region and thigh   . Pain in joint, shoulder region   . Personal history of fall   . RLQ abdominal pain 01/17/2011  . Senile osteoporosis   . Small bowel obstruction ([place])   . STEMI (ST elevation myocardial infarction) ([place]) 11/04/2014  . Unstable gait 11/04/2014  . Urine, incontinence, stress female 09/18/2014   Past Surgical History:  Procedure Laterality Date  . BREAST LUMPECTOMY Bilateral   . BREAST LUMPECTOMY WITH RADIOACTIVE SEED LOCALIZATION Left 03/15/2015   Procedure: LEFT BREAST LUMPECTOMY WITH RADIOACTIVE SEED LOCALIZATION;  Surgeon: Fanny Skates, MD;  Location: [facility]  OR;  Service: General;  Laterality: Left;  . CARDIAC CATHETERIZATION N/A 11/08/2014   Procedure: Left Heart Cath and Coronary Angiography;  Surgeon: [name], MD;  Location: [facility];  Service: Cardiovascular;  Laterality: N/A;  . COLON SURGERY  02/1996    Colectomy, partial for diverticular bleed 90%  . EYE SURGERY    . JOINT REPLACEMENT Left 12/2000   Left knee; Dr. Wynelle Link  . LAPAROSCOPIC LYSIS OF ADHESIONS  11/15/10   Exploratory  . TONSILLECTOMY      Allergies  Allergen Reactions  . Sulfa Antibiotics Itching      Medication List       Accurate as of 09/20/15 11:59 PM. Always use your most recent med list.          acetaminophen 325 MG tablet Commonly known as:  TYLENOL Take 650 mg by mouth 2 (two) times daily. Take two tablets by mouth every 6 hours as needed for mild pain or fever   alum & mag hydroxide-simeth 200-200-20 MG/5ML suspension Commonly known as:  MAALOX/MYLANTA Take by mouth. 72ml every 4 hours as needed for indigestion   atorvastatin 20 MG tablet Commonly known as:  LIPITOR Take one tablet by mouth once daily for cholesterol   BETIMOL 0.5 % ophthalmic solution Generic drug:  timolol Place 1 drop into both eyes every morning.   carvedilol 3.125 MG tablet Commonly known as:  COREG Take one tablet by mouth twice daily with a meal   cloNIDine 0.1 MG tablet Commonly known as:  CATAPRES Take 0.1 mg by mouth 2 (two) times daily. 1 tab every 8 hours as needed for blood pressure >180/100   Coenzyme Q10 50 MG Tabs Take one tablet by mouth at bedtime   ferrous sulfate 325 (65 FE) MG tablet Take 325 mg by mouth daily with breakfast.   furosemide 20 MG tablet Commonly known as:  LASIX Take 20 mg by mouth daily.   isosorbide mononitrate 60 MG 24 hr tablet Commonly known as:  IMDUR Take 60 mg by mouth daily.   KLOR-CON 10 10 MEQ tablet Generic drug:  potassium chloride Take 10 mEq by mouth daily.   latanoprost 0.005 % ophthalmic solution Commonly known as:  XALATAN Place 1 drop into both eyes at bedtime.   mirtazapine 7.5 MG tablet Commonly known as:  REMERON TAKE 1 TABLET (7.5 MG TOTAL) BY MOUTH AT BEDTIME. NIGHTLY FOR APPETITE   omeprazole 20 MG capsule Commonly known as:  PRILOSEC Take 20  mg by mouth daily.   polyvinyl alcohol 1.4 % ophthalmic solution Commonly known as:  LIQUIFILM TEARS Place 2 drops into both eyes 2 (two) times daily.   vitamin B-12 1000 MCG tablet Commonly known as:  CYANOCOBALAMIN Take 1 tablet by mouth twice daily On Mondays,Wednesdays and Fridays       Review of Systems  Constitutional: Negative for chills, diaphoresis and fever.  HENT: Positive for hearing loss. Negative for congestion and ear pain.   Eyes: Negative.  Negative for discharge and redness.  Respiratory: Negative.  Negative for cough and shortness of breath.   Cardiovascular: Negative for chest pain, palpitations and leg swelling (ankles).       Resolved edema BLE. History of systolic heart failure.  Gastrointestinal: Negative for abdominal pain, constipation, diarrhea, nausea and vomiting.       History of diverticulosis. She denies any abdominal pain.  Genitourinary: Positive for frequency. Negative for dysuria, flank pain and urgency.  Musculoskeletal: Positive for back pain. Negative for  myalgias and neck pain.       Flat feet with dropped arches. Pin in the right medial malleolar bones. Chronic back pain. Chronic right shoulder pain. Right knee pain. Uses 4 wheel walker.  Skin: Negative.  Negative for rash.       S/p left upper outer lumpectomy  Neurological: Negative for dizziness, tremors and weakness (Generalized).  Hematological:       History of anemia.  Psychiatric/Behavioral: The patient is not nervous/anxious.     Immunization History  Administered Date(s) Administered  . Influenza Whole 11/19/2011, 12/02/2012  . Influenza-Unspecified 12/06/2013  . PPD Test 11/10/2014  . Pneumococcal Polysaccharide-23 02/18/2009   Pertinent  Health Maintenance Due  Topic Date Due  . PNA vac Low Risk Adult (2 of 2 - PCV13) 02/18/2010  . INFLUENZA VACCINE  09/19/2015  . MAMMOGRAM  10/13/2015  . DEXA SCAN  Completed   Fall Risk  07/13/2015 11/17/2014 04/21/2014 12/17/2012    Falls in the past year? No Yes Yes Yes  Number falls in past yr: - 1 1 1   Injury with Fall? - No No Yes  Risk for fall due to : - Impaired balance/gait;History of fall(s) - -   Functional Status Survey:    Vitals:   09/20/15 1127  BP: 134/78  Pulse: 74  Resp: 18  Temp: 97.9 F (36.6 C)  Weight: 128 lb 3.2 oz (58.2 kg)  Height: 5\' 4"  (1.626 m)   Body mass index is 22.01 kg/m. Physical Exam  Constitutional: She is oriented to person, place, and time. She appears well-developed and well-nourished. No distress.  HENT:  Head: Normocephalic and atraumatic.  Right Ear: External ear normal.  Left Ear: External ear normal.  Nose: Nose normal.  Mouth/Throat: Oropharynx is clear and moist. No oropharyngeal exudate.  Left nostril erythematous  Eyes: Conjunctivae and EOM are normal. Pupils are equal, round, and reactive to light. Right eye exhibits no discharge. Left eye exhibits no discharge. No scleral icterus.  Neck: Normal range of motion. Neck supple. No JVD present. No thyromegaly present.  Cardiovascular: Normal rate and regular rhythm.   Murmur heard.  Systolic murmur is present with a grade of 2/6  Pulmonary/Chest: Effort normal. No respiratory distress. She has no wheezes. She has rales (sparse). She exhibits no tenderness.  Abdominal: Soft. Bowel sounds are normal. She exhibits no distension. There is no tenderness. There is no rebound.  Musculoskeletal: Normal range of motion. She exhibits no edema (1+  bipedal) or tenderness.  Right knee deformity with osteoarthritic changes.  Lymphadenopathy:    She has no cervical adenopathy.  Neurological: She is alert and oriented to person, place, and time. She has normal reflexes. No cranial nerve deficit. She exhibits normal muscle tone. Coordination normal.  04/21/14 MMSE 28/30. Failed clock drawing.  Skin: Skin is warm and dry. No rash noted. She is not diaphoretic. No erythema.  The left upper outer breast s/p lumpectomy   Psychiatric: She has a normal mood and affect. Her behavior is normal. Judgment and thought content normal.    Labs reviewed:  Recent Labs  11/06/14 0340 11/08/14 0316 11/09/14 0230 11/10/14 0331  03/07/15 1321 05/16/15 05/23/15 07/20/15  NA 142 138 137 137  < > 143 141 142 142  K 3.4* 3.3* 4.3 3.8  < > 4.2 3.6 4.1 4.1  CL 109 108 109 108  --  106  --   --   --   CO2 24 22 21* 21*  --  24  --   --   --  GLUCOSE 106* 104* 98 95  --  105*  --   --   --   BUN 37* 26* 23* 22*  < > 24* 24* 25* 85*  CREATININE 2.62* 2.60* 2.57* 2.69*  < > 2.66* 2.2* 2.3* 2.2*  CALCIUM 8.2* 8.5* 8.6* 8.6*  --  9.5  --   --   --   MG 2.3 2.2 2.2  --   --   --   --   --   --   PHOS 4.3 3.8 4.4 4.3  --   --   --   --   --   < > = values in this interval not displayed.  Recent Labs  10/19/14 0824 11/04/14 1545  11/09/14 0230 11/10/14 0331  03/07/15 1321 05/16/15 05/23/15  AST 15 21  --   --   --   < > 16 12* 13  ALT 8 13*  --   --   --   < > 7* 5* 6*  ALKPHOS 48 55  --   --   --   < > 44 44 44  BILITOT 0.72 0.7  --   --   --   --  0.7  --   --   PROT 6.3* 7.5  --   --   --   --  6.7  --   --   ALBUMIN 3.5 4.4  < > 2.8* 2.8*  --  3.8  --   --   < > = values in this interval not displayed.  Recent Labs  11/09/14 0230 11/10/14 0331  03/07/15 1321 05/16/15 05/23/15 07/20/15  WBC 8.3 7.7  < > 5.6 5.8 4.6 5.3  NEUTROABS 5.8 5.3  --  3.3  --   --   --   HGB 9.8* 9.2*  < > 11.7* 10.5* 11.3* 10.3*  HCT 31.1* 29.3*  < > 35.9* 32* 35* 31*  MCV 92.3 92.4  --  92.8  --   --   --   PLT 146* 134*  < > 183 240 192 178  < > = values in this interval not displayed. Lab Results  Component Value Date   TSH 2.227 08/16/2014   Lab Results  Component Value Date   HGBA1C 5.6 11/06/2014   Lab Results  Component Value Date   CHOL 108 07/20/2015   HDL 45 07/20/2015   LDLCALC 39 07/20/2015   TRIG 121 07/20/2015   CHOLHDL 3.1 11/06/2014    Significant Diagnostic Results in last 30 days:  No results  found.  Assessment/Plan There are no diagnoses linked to this encounter. Hypertension Permissive blood pressure control, continue Carvedilol 3.125mg  bid, Clonidine 0.1mg  bid, Imdur 15mg , Furosemide 40mg , 07/20/15 Na 142, K 4.1, Bun 27, creat 2.25    Chronic diastolic congestive heart failure (HCC) Compensated clinically, continue Furosemide and Kcl, 07/20/15 Na 142, K 4.1, Bun 27, creat 2.25   CKD (chronic kidney disease) stage 3, GFR 30-59 ml/min 07/20/15 Na 142, K 4.1, Bun 27, creat 2.25 Baseline creat 2-3  Anxiety state Stable, cont Mirtazapine 7.5mg  nightly   Anemia 12/08/14 Hgb 10.9, continue Vit B12, Fe 03/07/15 Hgb 11.7 05/23/15 Hgb 11.3 07/20/15 Hgb 10.3    Edema Not apparent BLE, continue Furosemide 40mg  daily, Kcl 69meq, 07/20/15 Na 142, K 4.1, Bun 27, creat 2.25      GERD (gastroesophageal reflux disease) Stable, continue Omeprazole 20mg  daily.     Family/ staff Communication: continue AL for care assistance  Labs/tests  ordered:  none

## 2015-09-21 NOTE — Assessment & Plan Note (Signed)
Stable, continue Omeprazole 20mg daily.  

## 2015-09-21 NOTE — Assessment & Plan Note (Signed)
12/08/14 Hgb 10.9, continue Vit B12, Fe 03/07/15 Hgb 11.7 05/23/15 Hgb 11.3 07/20/15 Hgb 10.3

## 2015-09-21 NOTE — Assessment & Plan Note (Addendum)
Not apparent BLE, continue Furosemide 40mg  daily, Kcl 10meq, 07/20/15 Na 142, K 4.1, Bun 27, creat 2.25

## 2015-09-21 NOTE — Assessment & Plan Note (Signed)
07/20/15 Na 142, K 4.1, Bun 27, creat 2.25 Baseline creat 2-3

## 2015-09-21 NOTE — Assessment & Plan Note (Signed)
Compensated clinically, continue Furosemide and Kcl, 07/20/15 Na 142, K 4.1, Bun 27, creat 2.25

## 2015-09-21 NOTE — Assessment & Plan Note (Signed)
Stable, cont Mirtazapine 7.5mg nightly  

## 2015-09-21 NOTE — Assessment & Plan Note (Addendum)
Permissive blood pressure control, continue Carvedilol 3.125mg  bid, Clonidine 0.1mg  bid, Imdur 15mg , Furosemide 40mg , 07/20/15 Na 142, K 4.1, Bun 27, creat 2.25

## 2015-09-29 DIAGNOSIS — M6281 Muscle weakness (generalized): Secondary | ICD-10-CM | POA: Diagnosis not present

## 2015-09-29 DIAGNOSIS — M25561 Pain in right knee: Secondary | ICD-10-CM | POA: Diagnosis not present

## 2015-09-29 DIAGNOSIS — M21061 Valgus deformity, not elsewhere classified, right knee: Secondary | ICD-10-CM | POA: Diagnosis not present

## 2015-09-29 DIAGNOSIS — R2681 Unsteadiness on feet: Secondary | ICD-10-CM | POA: Diagnosis not present

## 2015-10-04 ENCOUNTER — Non-Acute Institutional Stay: Payer: Medicare Other | Admitting: Nurse Practitioner

## 2015-10-04 ENCOUNTER — Encounter: Payer: Self-pay | Admitting: Nurse Practitioner

## 2015-10-04 DIAGNOSIS — N318 Other neuromuscular dysfunction of bladder: Secondary | ICD-10-CM | POA: Diagnosis not present

## 2015-10-04 DIAGNOSIS — N183 Chronic kidney disease, stage 3 unspecified: Secondary | ICD-10-CM

## 2015-10-04 DIAGNOSIS — R609 Edema, unspecified: Secondary | ICD-10-CM

## 2015-10-04 DIAGNOSIS — I10 Essential (primary) hypertension: Secondary | ICD-10-CM

## 2015-10-04 DIAGNOSIS — K219 Gastro-esophageal reflux disease without esophagitis: Secondary | ICD-10-CM

## 2015-10-04 DIAGNOSIS — D649 Anemia, unspecified: Secondary | ICD-10-CM

## 2015-10-04 DIAGNOSIS — F411 Generalized anxiety disorder: Secondary | ICD-10-CM

## 2015-10-04 DIAGNOSIS — I5032 Chronic diastolic (congestive) heart failure: Secondary | ICD-10-CM | POA: Diagnosis not present

## 2015-10-04 NOTE — Assessment & Plan Note (Signed)
07/20/15 Na 142, K 4.1, Bun 27, creat 2.25 Baseline creat 2-3 Monitor CMP

## 2015-10-04 NOTE — Assessment & Plan Note (Signed)
Not apparent BLE, continue Furosemide 40mg  daily, Kcl 29meq, 07/20/15 Na 142, K 4.1, Bun 27, creat 2.25

## 2015-10-04 NOTE — Assessment & Plan Note (Signed)
Stable, continue Omeprazole 20mg daily.  

## 2015-10-04 NOTE — Assessment & Plan Note (Signed)
Urinary leakage.  

## 2015-10-04 NOTE — Assessment & Plan Note (Signed)
12/08/14 Hgb 10.9, continue Vit B12, Fe 03/07/15 Hgb 11.7 05/23/15 Hgb 11.3 07/20/15 Hgb 10.3 Update CBC

## 2015-10-04 NOTE — Assessment & Plan Note (Signed)
Compensated clinically, continue Furosemide and Kcl, 07/20/15 Na 142, K 4.1, Bun 27, creat 2.25, update CMP

## 2015-10-04 NOTE — Progress Notes (Signed)
Location:   [facility] Room Number: [place] of Service:  SNF (31) Provider:  Marlana Latus  NP    Patient Care Team: Estill Dooms, MD as PCP - General (Internal Medicine) Juanita Craver, MD as Consulting Physician (Gastroenterology) Shon Hough, MD as Consulting Physician (Ophthalmology) Thornell Sartorius, MD as Consulting Physician (Otolaryngology) [place] Jakevious Hollister Otho Darner, NP as Nurse Practitioner (Nurse Practitioner) Bo Merino, MD as Consulting Physician (Rheumatology) Suella Broad, MD as Consulting Physician (Physical Medicine and Rehabilitation) Netta Cedars, MD as Consulting Physician (Orthopedic Surgery) Fanny Skates, MD as Consulting Physician (General Surgery) Truitt Merle, MD as Consulting Physician (Hematology) Thea Silversmith, MD as Consulting Physician (Radiation Oncology) Rockwell Germany, RN as Registered Nurse Mauro Kaufmann, RN as Registered Nurse Sylvan Cheese, NP as Nurse Practitioner (Nurse Practitioner) [name], MD as Consulting Physician (Cardiology)  Extended Emergency Contact Information Primary Emergency Contact: [name],[name] Address: [address]          [place] [address] Johnnette Litter of [place] Phone: [phone] Mobile Phone: [phone] Relation: Daughter Secondary Emergency Contact: [name],[name]  [place] Mobile Phone: [phone] Relation: None  Code Status:  DNR Goals of care: Advanced Directive information Advanced Directives 10/04/2015  Does patient have an advance directive? Yes  Type of Paramedic of [place];Out of facility DNR (pink MOST or yellow form)  Does patient want to make changes to advanced directive? No - Patient declined  Copy of advanced directive(s) in chart? Yes     Chief Complaint  Patient presents with  . Acute Visit    Fell(8/14) bruise on(Lt) elbow and knee, some pain when she applys weight.     HPI:  Pt is a 82 y.o. female seen today for an acute visit for fell 10/02/15, small abrasion left elbow and knee, no change of ROM, still able to ambulate with some discomfort.      Hx of CHF compensated clinically while on Furosemide 40mg , Kcl, K 4.1 4/4//17. CKD creat 2.6 12/22/15, 2.3 05/23/15. Blood pressure is controlled on Carvedilol 3.125mg  bid, Clonidine 0.1mg  bid, Imdur 30mg  daily. Her mood is stable while on Mirtazapine 7.5mg  daily.   Past Medical History:  Diagnosis Date  . Arthritis    Osteoarthritis  . Cancer of left breast [facility]) August 2016  . Cerebral atherosclerosis 12/19/2012  . Cerebral atrophy 12/19/2012  . Cerebral ischemia 12/19/2012   Microvascular   . CKD (chronic kidney disease) stage 3, GFR 30-59 ml/min 11/03/2014  . Cough   . Dementia   . Diverticulosis of colon (without mention of hemorrhage)   . Edema   . Edema   . Flat feet   . GERD (gastroesophageal reflux disease)   . Glaucoma   . Heart murmur   . Hyperlipidemia   . Hypertension   . Hypertonicity of bladder   . Ischemic cardiomyopathy 11/04/2014  . Lumbago   . Osteoarthrosis, unspecified whether generalized or localized, unspecified site   . Other malaise and fatigue   . Pain in joint, pelvic region and thigh   . Pain in joint, shoulder region   . Personal history of fall   . RLQ abdominal pain 01/17/2011  . Senile osteoporosis   . Small bowel obstruction ([place])   . STEMI (ST elevation myocardial infarction) ([place]) 11/04/2014  . Unstable gait 11/04/2014  . Urine, incontinence, stress female 09/18/2014   Past Surgical History:  Procedure Laterality Date  . BREAST LUMPECTOMY Bilateral   .  BREAST LUMPECTOMY WITH RADIOACTIVE SEED LOCALIZATION Left 03/15/2015   Procedure: LEFT BREAST LUMPECTOMY WITH RADIOACTIVE SEED LOCALIZATION;  Surgeon: Fanny Skates, MD;  Location: [place];  Service: General;  Laterality: Left;  . CARDIAC CATHETERIZATION N/A 11/08/2014   Procedure: Left Heart Cath and Coronary  Angiography;  Surgeon: [name], MD;  Location: [facility];  Service: Cardiovascular;  Laterality: N/A;  . COLON SURGERY  02/1996   Colectomy, partial for diverticular bleed 90%  . EYE SURGERY    . JOINT REPLACEMENT Left 12/2000   Left knee; Dr. Wynelle Link  . LAPAROSCOPIC LYSIS OF ADHESIONS  11/15/10   Exploratory  . TONSILLECTOMY      Allergies  Allergen Reactions  . Sulfa Antibiotics Itching      Medication List       Accurate as of 10/04/15  6:18 PM. Always use your most recent med list.          acetaminophen 325 MG tablet Commonly known as:  TYLENOL Take 650 mg by mouth 2 (two) times daily. Take two tablets by mouth every 6 hours as needed for mild pain or fever   alum & mag hydroxide-simeth 200-200-20 MG/5ML suspension Commonly known as:  MAALOX/MYLANTA Take by mouth. 48ml every 4 hours as needed for indigestion   atorvastatin 20 MG tablet Commonly known as:  LIPITOR Take one tablet by mouth once daily for cholesterol   BETIMOL 0.5 % ophthalmic solution Generic drug:  timolol Place 1 drop into both eyes every morning.   carvedilol 3.125 MG tablet Commonly known as:  COREG Take one tablet by mouth twice daily with a meal   cloNIDine 0.1 MG tablet Commonly known as:  CATAPRES Take 0.1 mg by mouth 2 (two) times daily. 1 tab every 8 hours as needed for blood pressure >180/100   Coenzyme Q10 50 MG Tabs Take one tablet by mouth at bedtime   furosemide 20 MG tablet Commonly known as:  LASIX Take 20 mg by mouth daily.   isosorbide mononitrate 60 MG 24 hr tablet Commonly known as:  IMDUR Take 60 mg by mouth daily.   KLOR-CON 10 10 MEQ tablet Generic drug:  potassium chloride Take 10 mEq by mouth daily.   latanoprost 0.005 % ophthalmic solution Commonly known as:  XALATAN Place 1 drop into both eyes at bedtime.   mirtazapine 7.5 MG tablet Commonly known as:  REMERON TAKE 1 TABLET (7.5 MG TOTAL) BY MOUTH AT BEDTIME. NIGHTLY FOR APPETITE     omeprazole 20 MG capsule Commonly known as:  PRILOSEC Take 20 mg by mouth daily.   polyvinyl alcohol 1.4 % ophthalmic solution Commonly known as:  LIQUIFILM TEARS Place 2 drops into both eyes 2 (two) times daily.   vitamin B-12 1000 MCG tablet Commonly known as:  CYANOCOBALAMIN Take 1 tablet by mouth twice daily On Mondays,Wednesdays and Fridays       Review of Systems  Constitutional: Negative for chills, diaphoresis and fever.  HENT: Positive for hearing loss. Negative for congestion and ear pain.   Eyes: Negative.  Negative for discharge and redness.  Respiratory: Negative.  Negative for cough and shortness of breath.   Cardiovascular: Negative for chest pain, palpitations and leg swelling (ankles).       Resolved edema BLE. History of systolic heart failure.  Gastrointestinal: Negative for abdominal pain, constipation, diarrhea, nausea and vomiting.       History of diverticulosis. She denies any abdominal pain.  Genitourinary: Positive for frequency. Negative for dysuria, flank  pain and urgency.  Musculoskeletal: Positive for back pain. Negative for myalgias and neck pain.       Flat feet with dropped arches. Pin in the right medial malleolar bones. Chronic back pain. Chronic right shoulder pain. Right knee pain. Uses 4 wheel walker.  Skin: Negative.  Negative for rash.       S/p left upper outer lumpectomy  Neurological: Negative for dizziness, tremors and weakness (Generalized).  Hematological:       History of anemia.  Psychiatric/Behavioral: The patient is not nervous/anxious.     Immunization History  Administered Date(s) Administered  . Influenza Whole 11/19/2011, 12/02/2012  . Influenza-Unspecified 12/06/2013  . PPD Test 11/10/2014  . Pneumococcal Polysaccharide-23 02/18/2009   Pertinent  Health Maintenance Due  Topic Date Due  . PNA vac Low Risk Adult (2 of 2 - PCV13) 02/18/2010  . INFLUENZA VACCINE  09/19/2015  . MAMMOGRAM  10/13/2015  . DEXA SCAN   Completed   Fall Risk  07/13/2015 11/17/2014 04/21/2014 12/17/2012  Falls in the past year? No Yes Yes Yes  Number falls in past yr: - 1 1 1   Injury with Fall? - No No Yes  Risk for fall due to : - Impaired balance/gait;History of fall(s) - -   Functional Status Survey:    Vitals:   10/04/15 1030  BP: 138/72  Pulse: 76  Resp: 20  Temp: 98 F (36.7 C)  Weight: 128 lb 3.2 oz (58.2 kg)  Height: 5\' 4"  (1.626 m)   Body mass index is 22.01 kg/m. Physical Exam  Constitutional: She is oriented to person, place, and time. She appears well-developed and well-nourished. No distress.  HENT:  Head: Normocephalic and atraumatic.  Right Ear: External ear normal.  Left Ear: External ear normal.  Nose: Nose normal.  Mouth/Throat: Oropharynx is clear and moist. No oropharyngeal exudate.  Left nostril erythematous  Eyes: Conjunctivae and EOM are normal. Pupils are equal, round, and reactive to light. Right eye exhibits no discharge. Left eye exhibits no discharge. No scleral icterus.  Neck: Normal range of motion. Neck supple. No JVD present. No thyromegaly present.  Cardiovascular: Normal rate and regular rhythm.   Murmur heard.  Systolic murmur is present with a grade of 2/6  Pulmonary/Chest: Effort normal. No respiratory distress. She has no wheezes. She has rales (sparse). She exhibits no tenderness.  Abdominal: Soft. Bowel sounds are normal. She exhibits no distension. There is no tenderness. There is no rebound.  Musculoskeletal: Normal range of motion. She exhibits no edema (1+  bipedal) or tenderness.  Right knee deformity with osteoarthritic changes.  Lymphadenopathy:    She has no cervical adenopathy.  Neurological: She is alert and oriented to person, place, and time. She has normal reflexes. No cranial nerve deficit. She exhibits normal muscle tone. Coordination normal.  04/21/14 MMSE 28/30. Failed clock drawing.  Skin: Skin is warm and dry. No rash noted. She is not diaphoretic. No  erythema.  The left upper outer breast s/p lumpectomy  Psychiatric: She has a normal mood and affect. Her behavior is normal. Judgment and thought content normal.    Labs reviewed:  Recent Labs  11/06/14 0340 11/08/14 0316 11/09/14 0230 11/10/14 0331  03/07/15 1321 05/16/15 05/23/15 07/20/15  NA 142 138 137 137  < > 143 141 142 142  K 3.4* 3.3* 4.3 3.8  < > 4.2 3.6 4.1 4.1  CL 109 108 109 108  --  106  --   --   --  CO2 24 22 21* 21*  --  24  --   --   --   GLUCOSE 106* 104* 98 95  --  105*  --   --   --   BUN 37* 26* 23* 22*  < > 24* 24* 25* 85*  CREATININE 2.62* 2.60* 2.57* 2.69*  < > 2.66* 2.2* 2.3* 2.2*  CALCIUM 8.2* 8.5* 8.6* 8.6*  --  9.5  --   --   --   MG 2.3 2.2 2.2  --   --   --   --   --   --   PHOS 4.3 3.8 4.4 4.3  --   --   --   --   --   < > = values in this interval not displayed.  Recent Labs  10/19/14 0824 11/04/14 1545  11/09/14 0230 11/10/14 0331  03/07/15 1321 05/16/15 05/23/15  AST 15 21  --   --   --   < > 16 12* 13  ALT 8 13*  --   --   --   < > 7* 5* 6*  ALKPHOS 48 55  --   --   --   < > 44 44 44  BILITOT 0.72 0.7  --   --   --   --  0.7  --   --   PROT 6.3* 7.5  --   --   --   --  6.7  --   --   ALBUMIN 3.5 4.4  < > 2.8* 2.8*  --  3.8  --   --   < > = values in this interval not displayed.  Recent Labs  11/09/14 0230 11/10/14 0331  03/07/15 1321 05/16/15 05/23/15 07/20/15  WBC 8.3 7.7  < > 5.6 5.8 4.6 5.3  NEUTROABS 5.8 5.3  --  3.3  --   --   --   HGB 9.8* 9.2*  < > 11.7* 10.5* 11.3* 10.3*  HCT 31.1* 29.3*  < > 35.9* 32* 35* 31*  MCV 92.3 92.4  --  92.8  --   --   --   PLT 146* 134*  < > 183 240 192 178  < > = values in this interval not displayed. Lab Results  Component Value Date   TSH 2.227 08/16/2014   Lab Results  Component Value Date   HGBA1C 5.6 11/06/2014   Lab Results  Component Value Date   CHOL 108 07/20/2015   HDL 45 07/20/2015   LDLCALC 39 07/20/2015   TRIG 121 07/20/2015   CHOLHDL 3.1 11/06/2014     Significant Diagnostic Results in last 30 days:  No results found.  Assessment/Plan There are no diagnoses linked to this encounter. Hypertension Permissive blood pressure control, continue Carvedilol 3.125mg  bid, Clonidine 0.1mg  bid, Imdur 15mg , Furosemide 40mg , 07/20/15 Na 142, K 4.1, Bun 27, creat 2.25, update CMP    Chronic diastolic congestive heart failure (HCC) Compensated clinically, continue Furosemide and Kcl, 07/20/15 Na 142, K 4.1, Bun 27, creat 2.25, update CMP    GERD (gastroesophageal reflux disease) Stable, continue Omeprazole 20mg  daily.   CKD (chronic kidney disease) stage 3, GFR 30-59 ml/min 07/20/15 Na 142, K 4.1, Bun 27, creat 2.25 Baseline creat 2-3 Monitor CMP  Hypertonicity of bladder Urinary leakage.   Edema Not apparent BLE, continue Furosemide 40mg  daily, Kcl 33meq, 07/20/15 Na 142, K 4.1, Bun 27, creat 2.25     Anemia 12/08/14 Hgb 10.9, continue Vit B12, Fe 03/07/15  Hgb 11.7 05/23/15 Hgb 11.3 07/20/15 Hgb 10.3 Update CBC     Anxiety state Stable, cont Mirtazapine 7.5mg  nightly       Family/ staff Communication: continue AL for care assistance  Labs/tests ordered:  CBC, CMP, TSH

## 2015-10-04 NOTE — Assessment & Plan Note (Signed)
Stable, cont Mirtazapine 7.5mg nightly  

## 2015-10-04 NOTE — Assessment & Plan Note (Signed)
Permissive blood pressure control, continue Carvedilol 3.125mg  bid, Clonidine 0.1mg  bid, Imdur 15mg , Furosemide 40mg , 07/20/15 Na 142, K 4.1, Bun 27, creat 2.25, update CMP

## 2015-10-05 DIAGNOSIS — H401122 Primary open-angle glaucoma, left eye, moderate stage: Secondary | ICD-10-CM | POA: Diagnosis not present

## 2015-10-05 DIAGNOSIS — H04123 Dry eye syndrome of bilateral lacrimal glands: Secondary | ICD-10-CM | POA: Diagnosis not present

## 2015-10-05 DIAGNOSIS — H401113 Primary open-angle glaucoma, right eye, severe stage: Secondary | ICD-10-CM | POA: Diagnosis not present

## 2015-10-05 DIAGNOSIS — I10 Essential (primary) hypertension: Secondary | ICD-10-CM | POA: Diagnosis not present

## 2015-10-05 DIAGNOSIS — Z961 Presence of intraocular lens: Secondary | ICD-10-CM | POA: Diagnosis not present

## 2015-10-05 LAB — BASIC METABOLIC PANEL
BUN: 22 mg/dL — AB (ref 4–21)
CREATININE: 2.2 mg/dL — AB (ref <=1.1)
GLUCOSE: 87 mg/dL
Potassium: 3.6 mmol/L (ref 3.4–5.3)
Sodium: 143 mmol/L (ref 137–147)

## 2015-10-05 LAB — CBC AND DIFFERENTIAL
HCT: 34 % — AB (ref 36–46)
Hemoglobin: 11.5 g/dL — AB (ref 12.0–16.0)
PLATELETS: 151 10*3/uL (ref 150–399)
WBC: 3.9 10^3/mL

## 2015-10-05 LAB — HEPATIC FUNCTION PANEL
ALK PHOS: 36 U/L (ref 25–125)
ALT: 5 U/L — AB (ref 7–35)
AST: 12 U/L — AB (ref 13–35)
Bilirubin, Total: 0.6 mg/dL

## 2015-10-05 LAB — TSH: TSH: 1.55 u[IU]/mL (ref <=5.90)

## 2015-10-11 ENCOUNTER — Other Ambulatory Visit: Payer: Self-pay | Admitting: *Deleted

## 2015-10-20 DIAGNOSIS — M25561 Pain in right knee: Secondary | ICD-10-CM | POA: Diagnosis not present

## 2015-10-20 DIAGNOSIS — M6281 Muscle weakness (generalized): Secondary | ICD-10-CM | POA: Diagnosis not present

## 2015-10-20 DIAGNOSIS — R2681 Unsteadiness on feet: Secondary | ICD-10-CM | POA: Diagnosis not present

## 2015-10-20 DIAGNOSIS — M21061 Valgus deformity, not elsewhere classified, right knee: Secondary | ICD-10-CM | POA: Diagnosis not present

## 2015-10-24 DIAGNOSIS — Z853 Personal history of malignant neoplasm of breast: Secondary | ICD-10-CM | POA: Diagnosis not present

## 2015-10-26 ENCOUNTER — Non-Acute Institutional Stay: Payer: Medicare Other | Admitting: Nurse Practitioner

## 2015-10-26 ENCOUNTER — Encounter: Payer: Self-pay | Admitting: Nurse Practitioner

## 2015-10-26 DIAGNOSIS — N183 Chronic kidney disease, stage 3 unspecified: Secondary | ICD-10-CM

## 2015-10-26 DIAGNOSIS — R609 Edema, unspecified: Secondary | ICD-10-CM

## 2015-10-26 DIAGNOSIS — N318 Other neuromuscular dysfunction of bladder: Secondary | ICD-10-CM

## 2015-10-26 DIAGNOSIS — F411 Generalized anxiety disorder: Secondary | ICD-10-CM

## 2015-10-26 DIAGNOSIS — G319 Degenerative disease of nervous system, unspecified: Secondary | ICD-10-CM | POA: Diagnosis not present

## 2015-10-26 DIAGNOSIS — K219 Gastro-esophageal reflux disease without esophagitis: Secondary | ICD-10-CM

## 2015-10-26 DIAGNOSIS — I10 Essential (primary) hypertension: Secondary | ICD-10-CM

## 2015-10-26 DIAGNOSIS — D649 Anemia, unspecified: Secondary | ICD-10-CM

## 2015-10-26 DIAGNOSIS — I5032 Chronic diastolic (congestive) heart failure: Secondary | ICD-10-CM

## 2015-10-26 NOTE — Assessment & Plan Note (Addendum)
Permissive blood pressure control, continue Carvedilol 3.125mg  bid, Clonidine 0.1mg  bid, Imdur 15mg , Furosemide 40mg , 10/05/15 Hgb 11.5, Na 143, K 3.6, Bun 22, creat 2.16, TSH1.55

## 2015-10-26 NOTE — Assessment & Plan Note (Signed)
Stable, cont Mirtazapine 7.5mg  nightly

## 2015-10-26 NOTE — Assessment & Plan Note (Signed)
Last Hgb 11.5 10/05/15

## 2015-10-26 NOTE — Assessment & Plan Note (Signed)
Not apparent BLE, continue Furosemide 40mg  daily, Kcl 34meq,  10/05/15 Hgb 11.5, Na 143, K 3.6, Bun 22, creat 2.16, TSH1.55

## 2015-10-26 NOTE — Assessment & Plan Note (Signed)
10/05/15 Hgb 11.5, Na 143, K 3.6, Bun 22, creat 2.16, TSH1.55Compensated clinically, continue Furosemide and Kcl

## 2015-10-26 NOTE — Assessment & Plan Note (Signed)
10/05/15 Hgb 11.5, Na 143, K 3.6, Bun 22, creat 2.16, TSH1.55 Baseline creat 2-3

## 2015-10-26 NOTE — Assessment & Plan Note (Signed)
Urinary leakage.  

## 2015-10-26 NOTE — Assessment & Plan Note (Signed)
Stable, continue Omeprazole 20mg daily.  

## 2015-10-26 NOTE — Progress Notes (Signed)
Location:   [facility]  Provider:  Marlana Latus  NP    Patient Care Team: Estill Dooms, MD as PCP - General (Internal Medicine) Juanita Craver, MD as Consulting Physician (Gastroenterology) Shon Hough, MD as Consulting Physician (Ophthalmology) Thornell Sartorius, MD as Consulting Physician (Otolaryngology) [place] Jenisse Vullo Otho Darner, NP as Nurse Practitioner (Nurse Practitioner) Bo Merino, MD as Consulting Physician (Rheumatology) Suella Broad, MD as Consulting Physician (Physical Medicine and Rehabilitation) Netta Cedars, MD as Consulting Physician (Orthopedic Surgery) Fanny Skates, MD as Consulting Physician (General Surgery) Truitt Merle, MD as Consulting Physician (Hematology) Thea Silversmith, MD as Consulting Physician (Radiation Oncology) Rockwell Germany, RN as Registered Nurse Mauro Kaufmann, RN as Registered Nurse Sylvan Cheese, NP as Nurse Practitioner (Nurse Practitioner) [name], MD as Consulting Physician (Cardiology)  Extended Emergency Contact Information Primary Emergency Contact: [name],[name] Address: [address]           [address] Johnnet[place] Litter of [place] Phone: [phone] Mobile Phone: [phone] Relation: Daughter Secondary Emergency Contact: [name],[name]  [place] Mobile Phone: [phone] Relation: None  Code Status:  DNR Goals of care: Advanced Directive information Advanced Directives 10/26/2015  Does patient have an advance directive? Yes  Type of Paramedic of [place];Out of facility DNR (pink MOST or yellow form)  Does patient want to make changes to advanced directive? No - Patient declined  Copy of advanced directive(s) in chart? Yes     Chief Complaint  Patient presents with  . Annual Exam    HPI:  Pt is a 82 y.o. female seen today for evaluation of chronic medical conditions.    Hx of CHF compensated clinically while on Furosemide 40mg , Kcl, K 3.6 10/05/15. CKD creat 2.2 10/05/15. Blood pressure is controlled on Carvedilol 3.125mg  bid, Clonidine 0.1mg  bid, Imdur 30mg  daily. Her mood is stable while on Mirtazapine 7.5mg  daily.   Past Medical History:  Diagnosis Date  . Arthritis    Osteoarthritis  . Cancer of left breast [facility]) August 2016  . Cerebral atherosclerosis 12/19/2012  . Cerebral atrophy 12/19/2012  . Cerebral ischemia 12/19/2012   Microvascular   . CKD (chronic kidney disease) stage 3, GFR 30-59 ml/min 11/03/2014  . Cough   . Dementia   . Diverticulosis of colon (without mention of hemorrhage)   . Edema   . Edema   . Flat feet   . GERD (gastroesophageal reflux disease)   . Glaucoma   . Heart murmur   . Hyperlipidemia   . Hypertension   . Hypertonicity of bladder   . Ischemic cardiomyopathy 11/04/2014  . Lumbago   . Osteoarthrosis, unspecified whether generalized or localized, unspecified site   . Other malaise and fatigue   . Pain in joint, pelvic region and thigh   . Pain in joint, shoulder region   . Personal history of fall   . RLQ abdominal pain 01/17/2011  . Senile osteoporosis   . Small bowel obstruction ([place])   . STEMI (ST elevation myocardial infarction) ([place]) 11/04/2014  . Unstable gait 11/04/2014  . Urine, incontinence, stress female 09/18/2014   Past Surgical History:  Procedure Laterality Date  . BREAST LUMPECTOMY Bilateral   . BREAST LUMPECTOMY WITH RADIOACTIVE SEED LOCALIZATION Left 03/15/2015   Procedure: LEFT BREAST LUMPECTOMY WITH RADIOACTIVE SEED LOCALIZATION;  Surgeon: Fanny Skates, MD;  Location: [place];  Service: General;  Laterality: Left;  . CARDIAC CATHETERIZATION N/A 11/08/2014  Procedure: Left Heart Cath and Coronary Angiography;  Surgeon: [name], MD;  Location: [place] [facility];  Service: Cardiovascular;  Laterality: N/A;  . COLON SURGERY  02/1996   Colectomy, partial for diverticular bleed 90%  . EYE  SURGERY    . JOINT REPLACEMENT Left 12/2000   Left knee; Dr. Wynelle Link  . LAPAROSCOPIC LYSIS OF ADHESIONS  11/15/10   Exploratory  . TONSILLECTOMY      Allergies  Allergen Reactions  . Sulfa Antibiotics Itching      Medication List       Accurate as of 10/26/15  4:22 PM. Always use your most recent med list.          acetaminophen 325 MG tablet Commonly known as:  TYLENOL Take 650 mg by mouth 2 (two) times daily. Take two tablets by mouth every 6 hours as needed for mild pain or fever   alum & mag hydroxide-simeth 200-200-20 MG/5ML suspension Commonly known as:  MAALOX/MYLANTA Take by mouth. 68ml every 4 hours as needed for indigestion   atorvastatin 20 MG tablet Commonly known as:  LIPITOR Take one tablet by mouth once daily for cholesterol   BETIMOL 0.5 % ophthalmic solution Generic drug:  timolol Place 1 drop into both eyes every morning.   carvedilol 3.125 MG tablet Commonly known as:  COREG Take one tablet by mouth twice daily with a meal   cloNIDine 0.1 MG tablet Commonly known as:  CATAPRES Take 0.1 mg by mouth 2 (two) times daily. 1 tab every 8 hours as needed for blood pressure >180/100   Coenzyme Q10 50 MG Tabs Take one tablet by mouth at bedtime   furosemide 20 MG tablet Commonly known as:  LASIX Take 20 mg by mouth daily.   isosorbide mononitrate 60 MG 24 hr tablet Commonly known as:  IMDUR Take 60 mg by mouth daily.   KLOR-CON 10 10 MEQ tablet Generic drug:  potassium chloride Take 10 mEq by mouth daily.   latanoprost 0.005 % ophthalmic solution Commonly known as:  XALATAN Place 1 drop into both eyes at bedtime.   mirtazapine 7.5 MG tablet Commonly known as:  REMERON TAKE 1 TABLET (7.5 MG TOTAL) BY MOUTH AT BEDTIME. NIGHTLY FOR APPETITE   omeprazole 20 MG capsule Commonly known as:  PRILOSEC Take 20 mg by mouth daily.   polyvinyl alcohol 1.4 % ophthalmic solution Commonly known as:  LIQUIFILM TEARS Place 2 drops into both eyes 2 (two)  times daily.   vitamin B-12 1000 MCG tablet Commonly known as:  CYANOCOBALAMIN Take 1 tablet by mouth twice daily On Mondays,Wednesdays and Fridays       Review of Systems  Constitutional: Negative for chills, diaphoresis and fever.  HENT: Positive for hearing loss. Negative for congestion and ear pain.   Eyes: Negative.  Negative for discharge and redness.  Respiratory: Negative.  Negative for cough and shortness of breath.   Cardiovascular: Negative for chest pain, palpitations and leg swelling (ankles).       Resolved edema BLE. History of systolic heart failure.  Gastrointestinal: Negative for abdominal pain, constipation, diarrhea, nausea and vomiting.       History of diverticulosis. She denies any abdominal pain.  Genitourinary: Positive for frequency. Negative for dysuria, flank pain and urgency.  Musculoskeletal: Positive for back pain. Negative for myalgias and neck pain.       Flat feet with dropped arches. Pin in the right medial malleolar bones. Chronic back pain. Chronic right shoulder pain. Right knee  pain. Uses 4 wheel walker.  Skin: Negative.  Negative for rash.       S/p left upper outer lumpectomy  Neurological: Negative for dizziness, tremors and weakness (Generalized).  Hematological:       History of anemia.  Psychiatric/Behavioral: The patient is not nervous/anxious.     Immunization History  Administered Date(s) Administered  . Influenza Whole 11/19/2011, 12/02/2012  . Influenza-Unspecified 12/06/2013  . PPD Test 11/10/2014  . Pneumococcal Polysaccharide-23 02/18/2009   Pertinent  Health Maintenance Due  Topic Date Due  . PNA vac Low Risk Adult (2 of 2 - PCV13) 02/18/2010  . INFLUENZA VACCINE  09/19/2015  . MAMMOGRAM  10/13/2015  . DEXA SCAN  Completed   Fall Risk  07/13/2015 11/17/2014 04/21/2014 12/17/2012  Falls in the past year? No Yes Yes Yes  Number falls in past yr: - 1 1 1   Injury with Fall? - No No Yes  Risk for fall due to : - Impaired  balance/gait;History of fall(s) - -   Functional Status Survey:    Vitals:   10/26/15 1550  BP: (!) 160/90  Pulse: 70  Resp: 20  Temp: 98.3 F (36.8 C)  TempSrc: Oral  SpO2: 94%  Weight: 130 lb 6.4 oz (59.1 kg)  Height: 5\' 4"  (1.626 m)   Body mass index is 22.38 kg/m. Physical Exam  Constitutional: She is oriented to person, place, and time. She appears well-developed and well-nourished. No distress.  HENT:  Head: Normocephalic and atraumatic.  Right Ear: External ear normal.  Left Ear: External ear normal.  Nose: Nose normal.  Mouth/Throat: Oropharynx is clear and moist. No oropharyngeal exudate.  Left nostril erythematous  Eyes: Conjunctivae and EOM are normal. Pupils are equal, round, and reactive to light. Right eye exhibits no discharge. Left eye exhibits no discharge. No scleral icterus.  Neck: Normal range of motion. Neck supple. No JVD present. No thyromegaly present.  Cardiovascular: Normal rate and regular rhythm.   Murmur heard.  Systolic murmur is present with a grade of 2/6  Pulmonary/Chest: Effort normal. No respiratory distress. She has no wheezes. She has rales (sparse). She exhibits no tenderness.  Abdominal: Soft. Bowel sounds are normal. She exhibits no distension. There is no tenderness. There is no rebound.  Musculoskeletal: Normal range of motion. She exhibits no edema (1+  bipedal) or tenderness.  Right knee deformity with osteoarthritic changes.  Lymphadenopathy:    She has no cervical adenopathy.  Neurological: She is alert and oriented to person, place, and time. She has normal reflexes. No cranial nerve deficit. She exhibits normal muscle tone. Coordination normal.  04/21/14 MMSE 28/30. Failed clock drawing.  Skin: Skin is warm and dry. No rash noted. She is not diaphoretic. No erythema.  The left upper outer breast s/p lumpectomy  Psychiatric: She has a normal mood and affect. Her behavior is normal. Judgment and thought content normal.    Labs  reviewed:  Recent Labs  11/06/14 0340 11/08/14 0316 11/09/14 0230 11/10/14 0331  03/07/15 1321  05/23/15 07/20/15 10/05/15  NA 142 138 137 137  < > 143  < > 142 142 143  K 3.4* 3.3* 4.3 3.8  < > 4.2  < > 4.1 4.1 3.6  CL 109 108 109 108  --  106  --   --   --   --   CO2 24 22 21* 21*  --  24  --   --   --   --   GLUCOSE 106* 104*  98 95  --  105*  --   --   --   --   BUN 37* 26* 23* 22*  < > 24*  < > 25* 85* 22*  CREATININE 2.62* 2.60* 2.57* 2.69*  < > 2.66*  < > 2.3* 2.2* 2.2*  CALCIUM 8.2* 8.5* 8.6* 8.6*  --  9.5  --   --   --   --   MG 2.3 2.2 2.2  --   --   --   --   --   --   --   PHOS 4.3 3.8 4.4 4.3  --   --   --   --   --   --   < > = values in this interval not displayed.  Recent Labs  11/04/14 1545  11/09/14 0230 11/10/14 0331  03/07/15 1321 05/16/15 05/23/15 10/05/15  AST 21  --   --   --   < > 16 12* 13 12*  ALT 13*  --   --   --   < > 7* 5* 6* 5*  ALKPHOS 55  --   --   --   < > 44 44 44 36  BILITOT 0.7  --   --   --   --  0.7  --   --   --   PROT 7.5  --   --   --   --  6.7  --   --   --   ALBUMIN 4.4  < > 2.8* 2.8*  --  3.8  --   --   --   < > = values in this interval not displayed.  Recent Labs  11/09/14 0230 11/10/14 0331  03/07/15 1321  05/23/15 07/20/15 10/05/15  WBC 8.3 7.7  < > 5.6  < > 4.6 5.3 3.9  NEUTROABS 5.8 5.3  --  3.3  --   --   --   --   HGB 9.8* 9.2*  < > 11.7*  < > 11.3* 10.3* 11.5*  HCT 31.1* 29.3*  < > 35.9*  < > 35* 31* 34*  MCV 92.3 92.4  --  92.8  --   --   --   --   PLT 146* 134*  < > 183  < > 192 178 151  < > = values in this interval not displayed. Lab Results  Component Value Date   TSH 1.55 10/05/2015   Lab Results  Component Value Date   HGBA1C 5.6 11/06/2014   Lab Results  Component Value Date   CHOL 108 07/20/2015   HDL 45 07/20/2015   LDLCALC 39 07/20/2015   TRIG 121 07/20/2015   CHOLHDL 3.1 11/06/2014    Significant Diagnostic Results in last 30 days:  No results found.  Assessment/Plan There are no  diagnoses linked to this encounter. Hypertension Permissive blood pressure control, continue Carvedilol 3.125mg  bid, Clonidine 0.1mg  bid, Imdur 15mg , Furosemide 40mg , 10/05/15 Hgb 11.5, Na 143, K 3.6, Bun 22, creat 2.16, CMK3.49  Chronic diastolic congestive heart failure (HCC) 10/05/15 Hgb 11.5, Na 143, K 3.6, Bun 22, creat 2.16, TSH1.55Compensated clinically, continue Furosemide and Kcl    GERD (gastroesophageal reflux disease) Stable, continue Omeprazole 20mg  daily.   CKD (chronic kidney disease) stage 3, GFR 30-59 ml/min 10/05/15 Hgb 11.5, Na 143, K 3.6, Bun 22, creat 2.16, TSH1.55 Baseline creat 2-3   Hypertonicity of bladder Urinary leakage.   Edema Not apparent BLE, continue Furosemide 40mg  daily, Kcl 52meq,  10/05/15 Hgb 11.5, Na 143, K 3.6, Bun 22, creat 2.16, TSH1.55    Anemia Last Hgb 11.5 10/05/15  Anxiety state Stable, cont Mirtazapine 7.5mg  nightly    Cerebral atrophy MMSE 29/30 today, she passed clock draw, continue AL for care needs.      Family/ staff Communication: continue AL for care assistance  Labs/tests ordered:  none

## 2015-10-26 NOTE — Assessment & Plan Note (Signed)
MMSE 29/30 today, she passed clock draw, continue AL for care needs.

## 2015-11-09 DIAGNOSIS — K219 Gastro-esophageal reflux disease without esophagitis: Secondary | ICD-10-CM | POA: Diagnosis not present

## 2015-11-22 DIAGNOSIS — M1711 Unilateral primary osteoarthritis, right knee: Secondary | ICD-10-CM | POA: Diagnosis not present

## 2015-11-29 ENCOUNTER — Encounter: Payer: Self-pay | Admitting: Nurse Practitioner

## 2015-11-29 DIAGNOSIS — H401111 Primary open-angle glaucoma, right eye, mild stage: Secondary | ICD-10-CM | POA: Diagnosis not present

## 2015-11-29 NOTE — Progress Notes (Signed)
This encounter was created in error - please disregard.

## 2015-12-04 ENCOUNTER — Encounter: Payer: Self-pay | Admitting: Internal Medicine

## 2015-12-04 ENCOUNTER — Non-Acute Institutional Stay: Payer: Medicare Other | Admitting: Internal Medicine

## 2015-12-04 DIAGNOSIS — J209 Acute bronchitis, unspecified: Secondary | ICD-10-CM | POA: Diagnosis not present

## 2015-12-04 MED ORDER — GUAIFENESIN-DM 100-10 MG/5ML PO SYRP
ORAL_SOLUTION | ORAL | 5 refills | Status: DC
Start: 2015-12-04 — End: 2016-01-03

## 2015-12-04 MED ORDER — DOXYCYCLINE HYCLATE 100 MG PO TABS: 100.0000 mg | ORAL_TABLET | Freq: Two times a day (BID) | ORAL | 0 refills | Status: DC

## 2015-12-04 NOTE — Progress Notes (Signed)
Location:  [place] Room Number: [place] of Service:  [id number] [phone]) Provider:   Jeanmarie Hubert, MD  Patient Care Team: Estill Dooms, MD as PCP - General (Internal Medicine) [place] .Lelon Frohlich, MD as Consulting Physician (Gastroenterology) Shon Hough, MD as Consulting Physician (Ophthalmology) Thornell Sartorius, MD as Consulting Physician (Otolaryngology) [place] Otho Darner, NP as Nurse Practitioner (Nurse Practitioner) Bo Merino, MD as Consulting Physician (Rheumatology) Suella Broad, MD as Consulting Physician (Physical Medicine and Rehabilitation) Netta Cedars, MD as Consulting Physician (Orthopedic Surgery) Fanny Skates, MD as Consulting Physician (General Surgery) Truitt Merle, MD as Consulting Physician (Hematology) Thea Silversmith, MD as Consulting Physician (Radiation Oncology) Rockwell Germany, RN as Registered Nurse Mauro Kaufmann, RN as Registered Nurse Sylvan Cheese, NP as Nurse Practitioner (Nurse Practitioner) [name], MD as Consulting Physician (Cardiology)  Extended Emergency Contact Information Primary Emergency Contact: [name],[name] Address: [address]          [place] [address] Johnnette Litter of [place] Phone: [phone] Mobile Phone: [phone] Relation: Daughter Secondary Emergency Contact: Mort Sawyers [place] Mobile Phone: [phone] Relation: None  Code Status:  Full Code Goals of care: Advanced Directive information Advanced Directives 12/04/2015  Does patient have an advance directive? Yes  Type of Paramedic of [place];Out of facility DNR (pink MOST or yellow form)  Does patient want to make changes to advanced directive? -  Copy of advanced directive(s) in chart? Yes  Pre-existing out of facility DNR order (yellow form or pink MOST form) Pink MOST form placed in chart (order not valid for inpatient use)     Chief  Complaint  Patient presents with  . Acute Visit    chest congestion    HPI:  Pt is a 82 y.o. female seen today for an acute visit for    Past Medical History:  Diagnosis Date  . Arthritis    Osteoarthritis  . Cancer of left breast [facility]) August 2016  . Cerebral atherosclerosis 12/19/2012  . Cerebral atrophy 12/19/2012  . Cerebral ischemia 12/19/2012   Microvascular   . CKD (chronic kidney disease) stage 3, GFR 30-59 ml/min 11/03/2014  . Cough   . Dementia   . Diverticulosis of colon (without mention of hemorrhage)   . Edema   . Edema   . Flat feet   . GERD (gastroesophageal reflux disease)   . Glaucoma   . Heart murmur   . Hyperlipidemia   . Hypertension   . Hypertonicity of bladder   . Ischemic cardiomyopathy 11/04/2014  . Lumbago   . Osteoarthrosis, unspecified whether generalized or localized, unspecified site   . Other malaise and fatigue   . Pain in joint, pelvic region and thigh   . Pain in joint, shoulder region   . Personal history of fall   . RLQ abdominal pain 01/17/2011  . Senile osteoporosis   . Small bowel obstruction   . STEMI (ST elevation myocardial infarction) ([place]) 11/04/2014  . Unstable gait 11/04/2014  . Urine, incontinence, stress female 09/18/2014   Past Surgical History:  Procedure Laterality Date  . BREAST LUMPECTOMY Bilateral   . BREAST LUMPECTOMY WITH RADIOACTIVE SEED LOCALIZATION Left 03/15/2015   Procedure: LEFT BREAST LUMPECTOMY WITH RADIOACTIVE SEED LOCALIZATION;  Surgeon: Fanny Skates, MD;  Location: [place];  Service: General;  Laterality: Left;  . CARDIAC CATHETERIZATION N/A 11/08/2014   Procedure: Left Heart Cath and Coronary Angiography;  Surgeon: [name], MD;  Location: [facility]  INVASIVE CV LAB;  Service: Cardiovascular;  Laterality: N/A;  . COLON SURGERY  02/1996   Colectomy, partial for diverticular bleed 90%  . EYE SURGERY    . JOINT REPLACEMENT Left 12/2000   Left knee; Dr. Wynelle Link  . LAPAROSCOPIC LYSIS OF ADHESIONS  11/15/10    Exploratory  . TONSILLECTOMY      Allergies  Allergen Reactions  . Sulfa Antibiotics Itching      Medication List       Accurate as of 12/04/15  4:09 PM. Always use your most recent med list.          acetaminophen 325 MG tablet Commonly known as:  TYLENOL Take 650 mg by mouth 2 (two) times daily. Take two tablets by mouth every 6 hours as needed for mild pain or fever   alum & mag hydroxide-simeth 200-200-20 MG/5ML suspension Commonly known as:  MAALOX/MYLANTA Take by mouth. 9ml every 4 hours as needed for indigestion   atorvastatin 20 MG tablet Commonly known as:  LIPITOR Take one tablet by mouth once daily for cholesterol   BETIMOL 0.5 % ophthalmic solution Generic drug:  timolol Place 1 drop into both eyes every morning.   carvedilol 3.125 MG tablet Commonly known as:  COREG Take one tablet by mouth twice daily with a meal   cloNIDine 0.1 MG tablet Commonly known as:  CATAPRES Take 0.1 mg by mouth 2 (two) times daily. 1 tab every 8 hours as needed for blood pressure >180/100   Coenzyme Q10 50 MG Tabs Take one tablet by mouth at bedtime   furosemide 20 MG tablet Commonly known as:  LASIX Take 20 mg by mouth daily.   isosorbide mononitrate 60 MG 24 hr tablet Commonly known as:  IMDUR Take 60 mg by mouth daily.   KLOR-CON 10 10 MEQ tablet Generic drug:  potassium chloride Take 10 mEq by mouth daily.   latanoprost 0.005 % ophthalmic solution Commonly known as:  XALATAN Place 1 drop into both eyes at bedtime.   mirtazapine 7.5 MG tablet Commonly known as:  REMERON TAKE 1 TABLET (7.5 MG TOTAL) BY MOUTH AT BEDTIME. NIGHTLY FOR APPETITE   omeprazole 20 MG capsule Commonly known as:  PRILOSEC Take 20 mg by mouth daily.   polyvinyl alcohol 1.4 % ophthalmic solution Commonly known as:  LIQUIFILM TEARS Place 2 drops into both eyes 2 (two) times daily.   prednisoLONE acetate 1 % ophthalmic suspension Commonly known as:  PRED FORTE 1 drop. One drop in  right eye four times daily for 5 days.   vitamin B-12 1000 MCG tablet Commonly known as:  CYANOCOBALAMIN Take 1 tablet by mouth twice daily On Mondays,Wednesdays and Fridays       Review of Systems  Immunization History  Administered Date(s) Administered  . Influenza Whole 11/19/2011, 12/02/2012  . Influenza-Unspecified 12/06/2013  . PPD Test 11/10/2014  . Pneumococcal Polysaccharide-23 02/18/2009   Pertinent  Health Maintenance Due  Topic Date Due  . PNA vac Low Risk Adult (2 of 2 - PCV13) 02/18/2010  . INFLUENZA VACCINE  09/19/2015  . MAMMOGRAM  10/13/2015  . DEXA SCAN  Completed   Fall Risk  07/13/2015 11/17/2014 04/21/2014 12/17/2012  Falls in the past year? No Yes Yes Yes  Number falls in past yr: - 1 1 1   Injury with Fall? - No No Yes  Risk for fall due to : - Impaired balance/gait;History of fall(s) - -   Functional Status Survey:    Vitals:   12/04/15  1558  Weight: 129 lb (58.5 kg)  Height: 5\' 4"  (1.626 m)   Body mass index is 22.14 kg/m. Physical Exam  Labs reviewed:  Recent Labs  03/07/15 1321  05/23/15 07/20/15 10/05/15  NA 143  < > 142 142 143  K 4.2  < > 4.1 4.1 3.6  CL 106  --   --   --   --   CO2 24  --   --   --   --   GLUCOSE 105*  --   --   --   --   BUN 24*  < > 25* 85* 22*  CREATININE 2.66*  < > 2.3* 2.2* 2.2*  CALCIUM 9.5  --   --   --   --   < > = values in this interval not displayed.  Recent Labs  03/07/15 1321 05/16/15 05/23/15 10/05/15  AST 16 12* 13 12*  ALT 7* 5* 6* 5*  ALKPHOS 44 44 44 36  BILITOT 0.7  --   --   --   PROT 6.7  --   --   --   ALBUMIN 3.8  --   --   --     Recent Labs  03/07/15 1321  05/23/15 07/20/15 10/05/15  WBC 5.6  < > 4.6 5.3 3.9  NEUTROABS 3.3  --   --   --   --   HGB 11.7*  < > 11.3* 10.3* 11.5*  HCT 35.9*  < > 35* 31* 34*  MCV 92.8  --   --   --   --   PLT 183  < > 192 178 151  < > = values in this interval not displayed. Lab Results  Component Value Date   TSH 1.55 10/05/2015   Lab  Results  Component Value Date   HGBA1C 5.6 11/06/2014   Lab Results  Component Value Date   CHOL 108 07/20/2015   HDL 45 07/20/2015   LDLCALC 39 07/20/2015   TRIG 121 07/20/2015   CHOLHDL 3.1 11/06/2014    Significant Diagnostic Results in last 30 days:  No results found.  Assessment/Plan There are no diagnoses linked to this encounter.   Family/ staff Communication:  Labs/tests ordered:

## 2015-12-04 NOTE — Progress Notes (Signed)
Progress Note      Facility  [facility] Room Number: [phone]  Place of Service: ALF (13)     Allergies  Allergen Reactions  . Sulfa Antibiotics Itching    Chief Complaint  Patient presents with  . Acute Visit    chest congestion    HPI:  Acute visit to evaluate chest congestion for the last week. Rattling in the chest. Denies pharyngitis. Afebrile.  Medications: Patient's Medications  New Prescriptions   No medications on file  Previous Medications   ACETAMINOPHEN (TYLENOL) 325 MG TABLET    Take 650 mg by mouth 2 (two) times daily. Take two tablets by mouth every 6 hours as needed for mild pain or fever   ALUM & MAG HYDROXIDE-SIMETH (MAALOX/MYLANTA) 200-200-20 MG/5ML SUSPENSION    Take by mouth. 24m every 4 hours as needed for indigestion   ATORVASTATIN (LIPITOR) 20 MG TABLET    Take one tablet by mouth once daily for cholesterol   BETIMOL 0.5 % OPHTHALMIC SOLUTION    Place 1 drop into both eyes every morning.    CARVEDILOL (COREG) 3.125 MG TABLET    Take one tablet by mouth twice daily with a meal   CLONIDINE (CATAPRES) 0.1 MG TABLET    Take 0.1 mg by mouth 2 (two) times daily. 1 tab every 8 hours as needed for blood pressure >180/100   COENZYME Q10 50 MG TABS    Take one tablet by mouth at bedtime   FUROSEMIDE (LASIX) 20 MG TABLET    Take 20 mg by mouth daily.   ISOSORBIDE MONONITRATE (IMDUR) 60 MG 24 HR TABLET    Take 60 mg by mouth daily.   KLOR-CON 10 10 MEQ TABLET    Take 10 mEq by mouth daily.   LATANOPROST (XALATAN) 0.005 % OPHTHALMIC SOLUTION    Place 1 drop into both eyes at bedtime.    MIRTAZAPINE (REMERON) 7.5 MG TABLET    TAKE 1 TABLET (7.5 MG TOTAL) BY MOUTH AT BEDTIME. NIGHTLY FOR APPETITE   OMEPRAZOLE (PRILOSEC) 20 MG CAPSULE    Take 20 mg by mouth daily.   POLYVINYL ALCOHOL (LIQUIFILM TEARS) 1.4 % OPHTHALMIC SOLUTION    Place 2 drops into both eyes 2 (two) times daily.   PREDNISOLONE ACETATE (PRED FORTE) 1 % OPHTHALMIC SUSPENSION    1 drop. One  drop in right eye four times daily for 5 days.   VITAMIN B-12 (CYANOCOBALAMIN) 1000 MCG TABLET    Take 1 tablet by mouth twice daily On Mondays,Wednesdays and Fridays  Modified Medications   No medications on file  Discontinued Medications   No medications on file     Review of Systems  Constitutional: Negative for chills, diaphoresis and fever.  HENT: Positive for hearing loss. Negative for congestion, ear pain, postnasal drip, rhinorrhea, sinus pressure, sore throat and tinnitus.   Eyes: Negative.  Negative for discharge and redness.  Respiratory: Positive for cough and chest tightness. Negative for shortness of breath.   Cardiovascular: Negative for chest pain, palpitations and leg swelling (ankles).       Resolved edema BLE. History of systolic heart failure.  Gastrointestinal: Negative for abdominal pain, constipation, diarrhea, nausea and vomiting.       History of diverticulosis. She denies any abdominal pain.  Genitourinary: Positive for frequency. Negative for dysuria, flank pain and urgency.  Musculoskeletal: Positive for back pain. Negative for myalgias and neck pain.       Flat feet with dropped arches. Pin in  the right medial malleolar bones. Chronic back pain. Chronic right shoulder pain. Right knee pain. Uses 4 wheel walker.  Skin: Negative.  Negative for rash.       S/p left upper outer lumpectomy  Neurological: Negative for dizziness, tremors and weakness (Generalized).  Hematological:       History of anemia.  Psychiatric/Behavioral: The patient is not nervous/anxious.     Vitals:   12/04/15 1558  Weight: 129 lb (58.5 kg)  Height: '5\' 4"'$  (1.626 m)   Wt Readings from Last 3 Encounters:  12/04/15 129 lb (58.5 kg)  10/26/15 130 lb 6.4 oz (59.1 kg)  10/04/15 128 lb 3.2 oz (58.2 kg)    Body mass index is 22.14 kg/m.  Physical Exam  Constitutional: She is oriented to person, place, and time. She appears well-developed and well-nourished. No distress.  HENT:    Head: Normocephalic and atraumatic.  Right Ear: External ear normal.  Left Ear: External ear normal.  Nose: Nose normal.  Mouth/Throat: Oropharynx is clear and moist. No oropharyngeal exudate.  Left nostril erythematous  Eyes: Conjunctivae and EOM are normal. Pupils are equal, round, and reactive to light. Right eye exhibits no discharge. Left eye exhibits no discharge. No scleral icterus.  Neck: Normal range of motion. Neck supple. No JVD present. No thyromegaly present.  Cardiovascular: Normal rate and regular rhythm.   Murmur heard.  Systolic murmur is present with a grade of 2/6  Pulmonary/Chest: Effort normal. No respiratory distress. She has no wheezes. She has rales (deep rattling in chest. rhonchi.). She exhibits no tenderness.  Abdominal: Soft. Bowel sounds are normal. She exhibits no distension. There is no tenderness. There is no rebound.  Musculoskeletal: Normal range of motion. She exhibits no edema (1+  bipedal) or tenderness.  Right knee deformity with osteoarthritic changes.  Lymphadenopathy:    She has no cervical adenopathy.  Neurological: She is alert and oriented to person, place, and time. She has normal reflexes. No cranial nerve deficit. She exhibits normal muscle tone. Coordination normal.  04/21/14 MMSE 28/30. Failed clock drawing.  Skin: Skin is warm and dry. No rash noted. She is not diaphoretic. No erythema.  The left upper outer breast s/p lumpectomy  Psychiatric: She has a normal mood and affect. Her behavior is normal. Judgment and thought content normal.     Labs reviewed: Lab Summary Latest Ref Rng & Units 10/05/2015 07/20/2015 05/23/2015  Hemoglobin 12.0 - 16.0 g/dL 11.5(A) 10.3(A) 11.3(A)  Hematocrit 36 - 46 % 34(A) 31(A) 35(A)  White count 10:3/mL 3.9 5.3 4.6  Platelet count 150 - 399 K/L 151 178 192  Sodium 137 - 147 mmol/L 143 142 142  Potassium 3.4 - 5.3 mmol/L 3.6 4.1 4.1  Calcium - (None) (None) (None)  Phosphorus - (None) (None) (None)   Creatinine 0.5 - 1.1 mg/dL 2.2(A) 2.2(A) 2.3(A)  AST 13 - 35 U/L 12(A) (None) 13  Alk Phos 25 - 125 U/L 36 (None) 44  Bilirubin - (None) (None) (None)  Glucose mg/dL 87 85 79  Cholesterol 0 - 200 mg/dL (None) 108 (None)  HDL cholesterol 35 - 70 mg/dL (None) 45 (None)  Triglycerides 40 - 160 mg/dL (None) 121 (None)  LDL Direct - (None) (None) (None)  LDL Calc mg/dL (None) 39 (None)  Total protein - (None) (None) (None)  Albumin - (None) (None) (None)  Some recent data might be hidden   Lab Results  Component Value Date   TSH 1.55 10/05/2015   Lab Results  Component Value  Date   BUN 22 (A) 10/05/2015   BUN 85 (A) 07/20/2015   BUN 25 (A) 05/23/2015   Lab Results  Component Value Date   CREATININE 2.2 (A) 10/05/2015   CREATININE 2.2 (A) 07/20/2015   CREATININE 2.3 (A) 05/23/2015   Lab Results  Component Value Date   HGBA1C 5.6 11/06/2014   HGBA1C 6.1 (H) 08/16/2014       Assessment/Plan  1. Acute bronchitis, unspecified organism - doxycycline (VIBRA-TABS) 100 MG tablet; Take 1 tablet (100 mg total) by mouth 2 (two) times daily.  Dispense: 20 tablet; Refill: 0 - guaiFENesin-dextromethorphan (ROBITUSSIN DM) 100-10 MG/5ML syrup; Take 15 cc before meals and at bedtime to suppress cough and congestion  Dispense: 240 mL; Refill: 5

## 2015-12-14 ENCOUNTER — Encounter: Payer: Medicare Other | Admitting: Internal Medicine

## 2015-12-15 DIAGNOSIS — M179 Osteoarthritis of knee, unspecified: Secondary | ICD-10-CM | POA: Diagnosis not present

## 2015-12-15 DIAGNOSIS — M25561 Pain in right knee: Secondary | ICD-10-CM | POA: Diagnosis not present

## 2015-12-15 DIAGNOSIS — M21061 Valgus deformity, not elsewhere classified, right knee: Secondary | ICD-10-CM | POA: Diagnosis not present

## 2015-12-15 DIAGNOSIS — R2681 Unsteadiness on feet: Secondary | ICD-10-CM | POA: Diagnosis not present

## 2015-12-15 DIAGNOSIS — M6281 Muscle weakness (generalized): Secondary | ICD-10-CM | POA: Diagnosis not present

## 2015-12-20 DIAGNOSIS — M21061 Valgus deformity, not elsewhere classified, right knee: Secondary | ICD-10-CM | POA: Diagnosis not present

## 2015-12-20 DIAGNOSIS — M179 Osteoarthritis of knee, unspecified: Secondary | ICD-10-CM | POA: Diagnosis not present

## 2015-12-20 DIAGNOSIS — R2681 Unsteadiness on feet: Secondary | ICD-10-CM | POA: Diagnosis not present

## 2015-12-20 DIAGNOSIS — M25561 Pain in right knee: Secondary | ICD-10-CM | POA: Diagnosis not present

## 2015-12-20 DIAGNOSIS — M6281 Muscle weakness (generalized): Secondary | ICD-10-CM | POA: Diagnosis not present

## 2016-01-03 ENCOUNTER — Encounter: Payer: Self-pay | Admitting: Nurse Practitioner

## 2016-01-03 ENCOUNTER — Non-Acute Institutional Stay: Payer: Medicare Other | Admitting: Nurse Practitioner

## 2016-01-03 DIAGNOSIS — I5032 Chronic diastolic (congestive) heart failure: Secondary | ICD-10-CM | POA: Diagnosis not present

## 2016-01-03 DIAGNOSIS — R609 Edema, unspecified: Secondary | ICD-10-CM | POA: Diagnosis not present

## 2016-01-03 DIAGNOSIS — K219 Gastro-esophageal reflux disease without esophagitis: Secondary | ICD-10-CM | POA: Diagnosis not present

## 2016-01-03 DIAGNOSIS — N183 Chronic kidney disease, stage 3 unspecified: Secondary | ICD-10-CM

## 2016-01-03 DIAGNOSIS — I10 Essential (primary) hypertension: Secondary | ICD-10-CM | POA: Diagnosis not present

## 2016-01-03 DIAGNOSIS — D649 Anemia, unspecified: Secondary | ICD-10-CM | POA: Diagnosis not present

## 2016-01-03 DIAGNOSIS — F411 Generalized anxiety disorder: Secondary | ICD-10-CM | POA: Diagnosis not present

## 2016-01-03 DIAGNOSIS — N318 Other neuromuscular dysfunction of bladder: Secondary | ICD-10-CM | POA: Diagnosis not present

## 2016-01-03 NOTE — Assessment & Plan Note (Signed)
Permissive blood pressure control, continue Carvedilol 3.125mg  bid, Clonidine 0.1mg  bid, Imdur 60mg , Furosemide 20mg , 10/05/15 Hgb 11.5, Na 143, K 3.6, Bun 22, creat 2.16, TSH1.55

## 2016-01-03 NOTE — Assessment & Plan Note (Signed)
10/05/15 Hgb 11.5, Na 143, K 3.6, Bun 22, creat 2.16, TSH1.55Compensated clinically, continue Furosemide 20mg  and Kcl 47meq

## 2016-01-03 NOTE — Progress Notes (Signed)
Location:  [place] Room Number: [place] of Service:  ALF [phone]) Provider:  Pinkney Venard, Manxie NP  Jeanmarie Hubert, MD  Patient Care Team: Estill Dooms, MD as PCP - General (Internal Medicine) Juanita Craver, MD as Consulting Physician (Gastroenterology) Shon Hough, MD as Consulting Physician (Ophthalmology) Thornell Sartorius, MD as Consulting Physician (Otolaryngology) Herndon Dimond Crotty Otho Darner, NP as Nurse Practitioner (Nurse Practitioner) Bo Merino, MD as Consulting Physician (Rheumatology) Suella Broad, MD as Consulting Physician (Physical Medicine and Rehabilitation) Netta Cedars, MD as Consulting Physician (Orthopedic Surgery) Fanny Skates, MD as Consulting Physician (General Surgery) Truitt Merle, MD as Consulting Physician (Hematology) Thea Silversmith, MD as Consulting Physician (Radiation Oncology) Rockwell Germany, RN as Registered Nurse Mauro Kaufmann, RN as Registered Nurse Sylvan Cheese, NP as Nurse Practitioner (Nurse Practitioner) [name], MD as Consulting Physician (Cardiology)  Extended Emergency Contact Information Primary Emergency Contact: [name],[name] Address: [address]          [place] [address] [place] Phone: [phone] Mobile Phone: [phone] Relation: Daughter Secondary Emergency Contact: [name],[name]  [place] Mobile Phone: [phone] Relation: None  Code Status:  Full Code Goals of care: Advanced Directive information Advanced Directives 01/03/2016  Does patient have an advance directive? Yes  Type of Paramedic of [place];Out of facility DNR (pink MOST or yellow form)  Does patient want to make changes to advanced directive? No - Patient declined  Copy of advanced directive(s) in chart? Yes  Pre-existing out of facility DNR order (yellow form or pink MOST form) -     Chief Complaint  Patient presents with    . Medical Management of Chronic Issues    HPI:  Pt is a 82 y.o. female seen today for medical management of chronic diseases.    Hx of CHF compensated clinically while on Furosemide 20mg , Kcl 28meq,  K 3.6 10/05/15. CKD creat 2.2 10/05/15. Blood pressure is controlled on Carvedilol 3.125mg  bid, Clonidine 0.1mg  bid, Imdur 60mg  daily. Her mood is stable while on Mirtazapine 7.5mg  daily.   Past Medical History:  Diagnosis Date  . Arthritis    Osteoarthritis  . Cancer of left breast [facility]) August 2016  . Cerebral atherosclerosis 12/19/2012  . Cerebral atrophy 12/19/2012  . Cerebral ischemia 12/19/2012   Microvascular   . CKD (chronic kidney disease) stage 3, GFR 30-59 ml/min 11/03/2014  . Cough   . Dementia   . Diverticulosis of colon (without mention of hemorrhage)   . Edema   . Edema   . Flat feet   . GERD (gastroesophageal reflux disease)   . Glaucoma   . Heart murmur   . Hyperlipidemia   . Hypertension   . Hypertonicity of bladder   . Ischemic cardiomyopathy 11/04/2014  . Lumbago   . Osteoarthrosis, unspecified whether generalized or localized, unspecified site   . Other malaise and fatigue   . Pain in joint, pelvic region and thigh   . Pain in joint, shoulder region   . Personal history of fall   . RLQ abdominal pain 01/17/2011  . Senile osteoporosis   . Small bowel obstruction   . STEMI (ST elevation myocardial infarction) ([place]) 11/04/2014  . Unstable gait 11/04/2014  . Urine, incontinence, stress female 09/18/2014   Past Surgical History:  Procedure Laterality Date  . BREAST LUMPECTOMY Bilateral   . BREAST LUMPECTOMY WITH RADIOACTIVE SEED LOCALIZATION Left 03/15/2015   Procedure: LEFT BREAST LUMPECTOMY WITH RADIOACTIVE SEED  LOCALIZATION;  Surgeon: Fanny Skates, MD;  Location: [facility];  Service: General;  Laterality: Left;  . CARDIAC CATHETERIZATION N/A 11/08/2014   Procedure: Left Heart Cath and Coronary Angiography;  Surgeon: [name], MD;  Location: [facility]  [facility];  Service: Cardiovascular;  Laterality: N/A;  . COLON SURGERY  02/1996   Colectomy, partial for diverticular bleed 90%  . EYE SURGERY    . JOINT REPLACEMENT Left 12/2000   Left knee; Dr. Wynelle Link  . LAPAROSCOPIC LYSIS OF ADHESIONS  11/15/10   Exploratory  . TONSILLECTOMY      Allergies  Allergen Reactions  . Sulfa Antibiotics Itching      Medication List       Accurate as of 01/03/16  1:41 PM. Always use your most recent med list.          acetaminophen 325 MG tablet Commonly known as:  TYLENOL Take 650 mg by mouth 2 (two) times daily. Take two tablets by mouth every 6 hours as needed for mild pain or fever   alum & mag hydroxide-simeth 200-200-20 MG/5ML suspension Commonly known as:  MAALOX/MYLANTA Take by mouth. 53ml every 4 hours as needed for indigestion   atorvastatin 20 MG tablet Commonly known as:  LIPITOR Take one tablet by mouth once daily for cholesterol   BETIMOL 0.5 % ophthalmic solution Generic drug:  timolol Place 1 drop into both eyes every morning.   carvedilol 3.125 MG tablet Commonly known as:  COREG Take one tablet by mouth twice daily with a meal   cloNIDine 0.1 MG tablet Commonly known as:  CATAPRES Take 0.1 mg by mouth 2 (two) times daily. 1 tab every 8 hours as needed for blood pressure >180/100   Coenzyme Q10 50 MG Tabs Take one tablet by mouth at bedtime   furosemide 20 MG tablet Commonly known as:  LASIX Take 20 mg by mouth daily.   isosorbide mononitrate 60 MG 24 hr tablet Commonly known as:  IMDUR Take 60 mg by mouth daily.   KLOR-CON 10 10 MEQ tablet Generic drug:  potassium chloride Take 10 mEq by mouth daily.   latanoprost 0.005 % ophthalmic solution Commonly known as:  XALATAN Place 1 drop into both eyes at bedtime.   mirtazapine 7.5 MG tablet Commonly known as:  REMERON TAKE 1 TABLET (7.5 MG TOTAL) BY MOUTH AT BEDTIME. NIGHTLY FOR APPETITE   omeprazole 20 MG capsule Commonly known as:  PRILOSEC Take 20 mg by  mouth daily.   polyvinyl alcohol 1.4 % ophthalmic solution Commonly known as:  LIQUIFILM TEARS Place 2 drops into both eyes 2 (two) times daily.   vitamin B-12 1000 MCG tablet Commonly known as:  CYANOCOBALAMIN Take 1 tablet by mouth twice daily On Mondays,Wednesdays and Fridays       Review of Systems  Constitutional: Negative for chills, diaphoresis and fever.  HENT: Positive for hearing loss. Negative for congestion, ear pain, postnasal drip, rhinorrhea, sinus pressure, sore throat and tinnitus.   Eyes: Negative.  Negative for discharge and redness.  Respiratory: Positive for cough and chest tightness. Negative for shortness of breath.   Cardiovascular: Negative for chest pain, palpitations and leg swelling (ankles).       Resolved edema BLE. History of systolic heart failure.  Gastrointestinal: Negative for abdominal pain, constipation, diarrhea, nausea and vomiting.       History of diverticulosis. She denies any abdominal pain.  Genitourinary: Positive for frequency. Negative for dysuria, flank pain and urgency.  Musculoskeletal: Positive for back  pain. Negative for myalgias and neck pain.       Flat feet with dropped arches. Pin in the right medial malleolar bones. Chronic back pain. Chronic right shoulder pain. Right knee pain. Uses 4 wheel walker.  Skin: Negative.  Negative for rash.       S/p left upper outer lumpectomy  Neurological: Negative for dizziness, tremors and weakness (Generalized).  Hematological:       History of anemia.  Psychiatric/Behavioral: The patient is not nervous/anxious.     Immunization History  Administered Date(s) Administered  . Influenza Whole 11/19/2011, 12/02/2012  . Influenza-Unspecified 12/06/2013, 12/06/2015  . PPD Test 11/10/2014  . Pneumococcal Polysaccharide-23 02/18/2009   Pertinent  Health Maintenance Due  Topic Date Due  . PNA vac Low Risk Adult (2 of 2 - PCV13) 02/18/2010  . MAMMOGRAM  10/13/2015  . INFLUENZA VACCINE   Completed  . DEXA SCAN  Completed   Fall Risk  07/13/2015 11/17/2014 04/21/2014 12/17/2012  Falls in the past year? No Yes Yes Yes  Number falls in past yr: - 1 1 1   Injury with Fall? - No No Yes  Risk for fall due to : - Impaired balance/gait;History of fall(s) - -   Functional Status Survey:    Vitals:   01/03/16 1211  BP: (!) 170/80  Pulse: 69  Resp: 20  Temp: 98.6 F (37 C)  Weight: 129 lb (58.5 kg)  Height: 5\' 4"  (1.626 m)   Body mass index is 22.14 kg/m. Physical Exam  Constitutional: She is oriented to person, place, and time. She appears well-developed and well-nourished. No distress.  HENT:  Head: Normocephalic and atraumatic.  Right Ear: External ear normal.  Left Ear: External ear normal.  Nose: Nose normal.  Mouth/Throat: Oropharynx is clear and moist. No oropharyngeal exudate.  Left nostril erythematous  Eyes: Conjunctivae and EOM are normal. Pupils are equal, round, and reactive to light. Right eye exhibits no discharge. Left eye exhibits no discharge. No scleral icterus.  Neck: Normal range of motion. Neck supple. No JVD present. No thyromegaly present.  Cardiovascular: Normal rate and regular rhythm.   Murmur heard.  Systolic murmur is present with a grade of 2/6  Pulmonary/Chest: Effort normal. No respiratory distress. She has no wheezes. She has rales (deep rattling in chest. rhonchi.). She exhibits no tenderness.  Abdominal: Soft. Bowel sounds are normal. She exhibits no distension. There is no tenderness. There is no rebound.  Musculoskeletal: Normal range of motion. She exhibits no edema (1+  bipedal) or tenderness.  Right knee deformity with osteoarthritic changes.  Lymphadenopathy:    She has no cervical adenopathy.  Neurological: She is alert and oriented to person, place, and time. She has normal reflexes. No cranial nerve deficit. She exhibits normal muscle tone. Coordination normal.  04/21/14 MMSE 28/30. Failed clock drawing.  Skin: Skin is warm and  dry. No rash noted. She is not diaphoretic. No erythema.  The left upper outer breast s/p lumpectomy  Psychiatric: She has a normal mood and affect. Her behavior is normal. Judgment and thought content normal.    Labs reviewed:  Recent Labs  03/07/15 1321  05/23/15 07/20/15 10/05/15  NA 143  < > 142 142 143  K 4.2  < > 4.1 4.1 3.6  CL 106  --   --   --   --   CO2 24  --   --   --   --   GLUCOSE 105*  --   --   --   --  BUN 24*  < > 25* 85* 22*  CREATININE 2.66*  < > 2.3* 2.2* 2.2*  CALCIUM 9.5  --   --   --   --   < > = values in this interval not displayed.  Recent Labs  03/07/15 1321 05/16/15 05/23/15 10/05/15  AST 16 12* 13 12*  ALT 7* 5* 6* 5*  ALKPHOS 44 44 44 36  BILITOT 0.7  --   --   --   PROT 6.7  --   --   --   ALBUMIN 3.8  --   --   --     Recent Labs  03/07/15 1321  05/23/15 07/20/15 10/05/15  WBC 5.6  < > 4.6 5.3 3.9  NEUTROABS 3.3  --   --   --   --   HGB 11.7*  < > 11.3* 10.3* 11.5*  HCT 35.9*  < > 35* 31* 34*  MCV 92.8  --   --   --   --   PLT 183  < > 192 178 151  < > = values in this interval not displayed. Lab Results  Component Value Date   TSH 1.55 10/05/2015   Lab Results  Component Value Date   HGBA1C 5.6 11/06/2014   Lab Results  Component Value Date   CHOL 108 07/20/2015   HDL 45 07/20/2015   LDLCALC 39 07/20/2015   TRIG 121 07/20/2015   CHOLHDL 3.1 11/06/2014    Significant Diagnostic Results in last 30 days:  No results found.  Assessment/Plan Hypertension Permissive blood pressure control, continue Carvedilol 3.125mg  bid, Clonidine 0.1mg  bid, Imdur 60mg , Furosemide 20mg , 10/05/15 Hgb 11.5, Na 143, K 3.6, Bun 22, creat 2.16, TSH1.55   Chronic diastolic congestive heart failure (HCC) 10/05/15 Hgb 11.5, Na 143, K 3.6, Bun 22, creat 2.16, TSH1.55Compensated clinically, continue Furosemide 20mg  and Kcl 51meq    GERD (gastroesophageal reflux disease) Stable, continue Omeprazole 20mg  daily.    CKD (chronic kidney disease)  stage 3, GFR 30-59 ml/min 10/05/15 Hgb 11.5, Na 143, K 3.6, Bun 22, creat 2.16, TSH1.55 Baseline creat 2-3   Hypertonicity of bladder Urinary leakage.    Edema Not apparent BLE, continue Furosemide 20mg  daily, Kcl 8meq,  10/05/15 Hgb 11.5, Na 143, K 3.6, Bun 22, creat 2.16, TSH1.55   Anemia Last Hgb 11.5 10/05/15   Anxiety state Stable, cont Mirtazapine 7.5mg  nightly        Family/ staff Communication: continue AL   Labs/tests ordered:  none

## 2016-01-03 NOTE — Assessment & Plan Note (Signed)
Last Hgb 11.5 10/05/15

## 2016-01-03 NOTE — Assessment & Plan Note (Signed)
Urinary leakage.  

## 2016-01-03 NOTE — Assessment & Plan Note (Signed)
Stable, cont Mirtazapine 7.5mg  nightly

## 2016-01-03 NOTE — Assessment & Plan Note (Signed)
10/05/15 Hgb 11.5, Na 143, K 3.6, Bun 22, creat 2.16, TSH1.55 Baseline creat 2-3

## 2016-01-03 NOTE — Assessment & Plan Note (Signed)
Not apparent BLE, continue Furosemide 20mg  daily, Kcl 56meq,  10/05/15 Hgb 11.5, Na 143, K 3.6, Bun 22, creat 2.16, TSH1.55

## 2016-01-03 NOTE — Assessment & Plan Note (Signed)
Stable, continue Omeprazole 20mg daily.  

## 2016-01-18 DIAGNOSIS — M1711 Unilateral primary osteoarthritis, right knee: Secondary | ICD-10-CM | POA: Diagnosis not present

## 2016-01-19 DIAGNOSIS — Z961 Presence of intraocular lens: Secondary | ICD-10-CM | POA: Diagnosis not present

## 2016-01-19 DIAGNOSIS — H401122 Primary open-angle glaucoma, left eye, moderate stage: Secondary | ICD-10-CM | POA: Diagnosis not present

## 2016-01-19 DIAGNOSIS — H401113 Primary open-angle glaucoma, right eye, severe stage: Secondary | ICD-10-CM | POA: Diagnosis not present

## 2016-01-23 DIAGNOSIS — M21061 Valgus deformity, not elsewhere classified, right knee: Secondary | ICD-10-CM | POA: Diagnosis not present

## 2016-01-23 DIAGNOSIS — M25561 Pain in right knee: Secondary | ICD-10-CM | POA: Diagnosis not present

## 2016-01-23 DIAGNOSIS — M6281 Muscle weakness (generalized): Secondary | ICD-10-CM | POA: Diagnosis not present

## 2016-01-23 DIAGNOSIS — R2681 Unsteadiness on feet: Secondary | ICD-10-CM | POA: Diagnosis not present

## 2016-01-23 DIAGNOSIS — M179 Osteoarthritis of knee, unspecified: Secondary | ICD-10-CM | POA: Diagnosis not present

## 2016-02-01 ENCOUNTER — Non-Acute Institutional Stay: Payer: Medicare Other | Admitting: Nurse Practitioner

## 2016-02-01 ENCOUNTER — Encounter: Payer: Self-pay | Admitting: Nurse Practitioner

## 2016-02-01 DIAGNOSIS — G8929 Other chronic pain: Secondary | ICD-10-CM

## 2016-02-01 DIAGNOSIS — I5032 Chronic diastolic (congestive) heart failure: Secondary | ICD-10-CM

## 2016-02-01 DIAGNOSIS — M25561 Pain in right knee: Secondary | ICD-10-CM

## 2016-02-01 DIAGNOSIS — N189 Chronic kidney disease, unspecified: Secondary | ICD-10-CM | POA: Diagnosis not present

## 2016-02-01 DIAGNOSIS — R609 Edema, unspecified: Secondary | ICD-10-CM

## 2016-02-01 DIAGNOSIS — F411 Generalized anxiety disorder: Secondary | ICD-10-CM | POA: Diagnosis not present

## 2016-02-01 DIAGNOSIS — I10 Essential (primary) hypertension: Secondary | ICD-10-CM

## 2016-02-01 DIAGNOSIS — K219 Gastro-esophageal reflux disease without esophagitis: Secondary | ICD-10-CM

## 2016-02-01 DIAGNOSIS — N393 Stress incontinence (female) (male): Secondary | ICD-10-CM | POA: Diagnosis not present

## 2016-02-01 DIAGNOSIS — N318 Other neuromuscular dysfunction of bladder: Secondary | ICD-10-CM

## 2016-02-01 DIAGNOSIS — D631 Anemia in chronic kidney disease: Secondary | ICD-10-CM

## 2016-02-01 DIAGNOSIS — N183 Chronic kidney disease, stage 3 unspecified: Secondary | ICD-10-CM

## 2016-02-01 NOTE — Assessment & Plan Note (Signed)
Urinary leakage.  

## 2016-02-01 NOTE — Assessment & Plan Note (Signed)
10/05/15 Hgb 11.5, Na 143, K 3.6, Bun 22, creat 2.16, TSH1.55. Compensated clinically, continue Furosemide 20mg  and Kcl 52meq

## 2016-02-01 NOTE — Progress Notes (Signed)
Location:  [facility] Room Number: [place] of Service:  SNF ([id number]) Provider:  Aleiah Mohammed, [place]xie  NP  Jeanmarie Hubert, MD  Patient Care Team: Estill Dooms, MD as PCP - General (Internal Medicine) Juanita Craver, MD as Consulting Physician (Gastroenterology) Shon Hough, MD as Consulting Physician (Ophthalmology) Thornell Sartorius, MD as Consulting Physician (Otolaryngology) Eddy Jamillia Closson Otho Darner, NP as Nurse Practitioner (Nurse Practitioner) Bo Merino, MD as Consulting Physician (Rheumatology) Suella Broad, MD as Consulting Physician (Physical Medicine and Rehabilitation) Netta Cedars, MD as Consulting Physician (Orthopedic Surgery) Fanny Skates, MD as Consulting Physician (General Surgery) Truitt Merle, MD as Consulting Physician (Hematology) Thea Silversmith, MD as Consulting Physician (Radiation Oncology) Rockwell Germany, RN as Registered Nurse Mauro Kaufmann, RN as Registered Nurse Sylvan Cheese, NP as Nurse Practitioner (Nurse Practitioner) [name], MD as Consulting Physician (Cardiology)  Extended Emergency Contact Information Primary Emergency Contact: [name],[name] Address: [address]          [place] [address] Johnnette Litter of [place] Phone: [phone] Mobile Phone: [phone] Relation: Daughter Secondary Emergency Contact: [name],[name]  [place] Mobile Phone: [phone] Relation: None  Code Status: Full Code Goals of care: Advanced Directive information Advanced Directives 02/01/2016  Does Patient Have a Medical Advance Directive? Yes  Type of Paramedic of [place];Out of facility DNR (pink MOST or yellow form);Living will  Does patient want to make changes to medical advance directive? No - Patient declined  Copy of [place] in Chart? Yes  Pre-existing out of facility DNR order (yellow form or pink MOST form) -     Chief  Complaint  Patient presents with  . Medical Management of Chronic Issues    HPI:  Pt is a 82 y.o. female seen today for medical management of chronic diseases.     Hx of CHF compensated clinically while on Furosemide 20mg , Kcl 54meq,  K 3.6 10/05/15. CKD creat 2.2 10/05/15. Blood pressure is controlled on Carvedilol 3.125mg  bid, Clonidine 0.1mg  bid, Imdur 60mg  daily. Her mood is stable while on Mirtazapine 7.5mg  daily.  Past Medical History:  Diagnosis Date  . Arthritis    Osteoarthritis  . Cancer of left breast [facility]) August 2016  . Cerebral atherosclerosis 12/19/2012  . Cerebral atrophy 12/19/2012  . Cerebral ischemia 12/19/2012   Microvascular   . CKD (chronic kidney disease) stage 3, GFR 30-59 ml/min 11/03/2014  . Cough   . Dementia   . Diverticulosis of colon (without mention of hemorrhage)   . Edema   . Edema   . Flat feet   . GERD (gastroesophageal reflux disease)   . Glaucoma   . Heart murmur   . Hyperlipidemia   . Hypertension   . Hypertonicity of bladder   . Ischemic cardiomyopathy 11/04/2014  . Lumbago   . Osteoarthrosis, unspecified whether generalized or localized, unspecified site   . Other malaise and fatigue   . Pain in joint, pelvic region and thigh   . Pain in joint, shoulder region   . Personal history of fall   . RLQ abdominal pain 01/17/2011  . Senile osteoporosis   . Small bowel obstruction   . STEMI (ST elevation myocardial infarction) ([place]) 11/04/2014  . Unstable gait 11/04/2014  . Urine, incontinence, stress female 09/18/2014   Past Surgical History:  Procedure Laterality Date  . BREAST LUMPECTOMY Bilateral   . BREAST LUMPECTOMY WITH RADIOACTIVE SEED LOCALIZATION Left 03/15/2015   Procedure: LEFT BREAST  LUMPECTOMY WITH RADIOACTIVE SEED LOCALIZATION;  Surgeon: Fanny Skates, MD;  Location: [place];  Service: General;  Laterality: Left;  . CARDIAC CATHETERIZATION N/A 11/08/2014   Procedure: Left Heart Cath and Coronary Angiography;  Surgeon: [name], MD;  Location: [place] CV LAB;  Service: Cardiovascular;  Laterality: N/A;  . COLON SURGERY  02/1996   Colectomy, partial for diverticular bleed 90%  . EYE SURGERY    . JOINT REPLACEMENT Left 12/2000   Left knee; Dr. Wynelle Link  . LAPAROSCOPIC LYSIS OF ADHESIONS  11/15/10   Exploratory  . TONSILLECTOMY      Allergies  Allergen Reactions  . Sulfa Antibiotics Itching      Medication List       Accurate as of 02/01/16  1:34 PM. Always use your most recent med list.          acetaminophen 325 MG tablet Commonly known as:  TYLENOL Take 650 mg by mouth 2 (two) times daily. Take two tablets by mouth every 6 hours as needed for mild pain or fever   alum & mag hydroxide-simeth 200-200-20 MG/5ML suspension Commonly known as:  MAALOX/MYLANTA Take by mouth. 25ml every 4 hours as needed for indigestion   atorvastatin 20 MG tablet Commonly known as:  LIPITOR Take one tablet by mouth once daily for cholesterol   BETIMOL 0.5 % ophthalmic solution Generic drug:  timolol Place 1 drop into both eyes every morning.   carvedilol 3.125 MG tablet Commonly known as:  COREG Take one tablet by mouth twice daily with a meal   cloNIDine 0.1 MG tablet Commonly known as:  CATAPRES Take 0.1 mg by mouth 2 (two) times daily. 1 tab every 8 hours as needed for blood pressure >180/100   Coenzyme Q10 50 MG Tabs Take one tablet by mouth at bedtime   furosemide 20 MG tablet Commonly known as:  LASIX Take 20 mg by mouth daily.   isosorbide mononitrate 60 MG 24 hr tablet Commonly known as:  IMDUR Take 60 mg by mouth daily.   KLOR-CON 10 10 MEQ tablet Generic drug:  potassium chloride Take 10 mEq by mouth daily.   latanoprost 0.005 % ophthalmic solution Commonly known as:  XALATAN Place 1 drop into both eyes at bedtime.   mirtazapine 7.5 MG tablet Commonly known as:  REMERON TAKE 1 TABLET (7.5 MG TOTAL) BY MOUTH AT BEDTIME. NIGHTLY FOR APPETITE   polyvinyl alcohol 1.4 %  ophthalmic solution Commonly known as:  LIQUIFILM TEARS Place 2 drops into both eyes 2 (two) times daily.   vitamin B-12 1000 MCG tablet Commonly known as:  CYANOCOBALAMIN Take 1 tablet by mouth twice daily On Mondays,Wednesdays and Fridays       Review of Systems  Constitutional: Negative for chills, diaphoresis and fever.  HENT: Positive for hearing loss. Negative for congestion, ear pain, postnasal drip, rhinorrhea, sinus pressure, sore throat and tinnitus.   Eyes: Negative.  Negative for discharge and redness.  Respiratory: Positive for cough and chest tightness. Negative for shortness of breath.   Cardiovascular: Negative for chest pain, palpitations and leg swelling (ankles).       Resolved edema BLE. History of systolic heart failure.  Gastrointestinal: Negative for abdominal pain, constipation, diarrhea, nausea and vomiting.       History of diverticulosis. She denies any abdominal pain.  Genitourinary: Positive for frequency. Negative for dysuria, flank pain and urgency.  Musculoskeletal: Positive for back pain. Negative for myalgias and neck pain.  Flat feet with dropped arches. Pin in the right medial malleolar bones. Chronic back pain. Chronic right shoulder pain. Right knee pain. Uses 4 wheel walker.  Skin: Negative.  Negative for rash.       S/p left upper outer lumpectomy  Neurological: Negative for dizziness, tremors and weakness (Generalized).  Hematological:       History of anemia.  Psychiatric/Behavioral: The patient is not nervous/anxious.     Immunization History  Administered Date(s) Administered  . Influenza Whole 11/19/2011, 12/02/2012  . Influenza-Unspecified 12/06/2013, 12/06/2015  . PPD Test 11/10/2014  . Pneumococcal Polysaccharide-23 02/18/2009   Pertinent  Health Maintenance Due  Topic Date Due  . PNA vac Low Risk Adult (2 of 2 - PCV13) 02/18/2010  . MAMMOGRAM  10/13/2015  . INFLUENZA VACCINE  Completed  . DEXA SCAN  Completed   Fall  Risk  07/13/2015 11/17/2014 04/21/2014 12/17/2012  Falls in the past year? No Yes Yes Yes  Number falls in past yr: - 1 1 1   Injury with Fall? - No No Yes  Risk for fall due to : - Impaired balance/gait;History of fall(s) - -   Functional Status Survey:    Vitals:   02/01/16 1229  BP: 134/70  Pulse: 64  Resp: 18  Temp: 98.4 F (36.9 C)  Weight: 131 lb 3.2 oz (59.5 kg)   Body mass index is 22.52 kg/m. Physical Exam  Constitutional: She is oriented to person, place, and time. She appears well-developed and well-nourished. No distress.  HENT:  Head: Normocephalic and atraumatic.  Right Ear: External ear normal.  Left Ear: External ear normal.  Nose: Nose normal.  Mouth/Throat: Oropharynx is clear and moist. No oropharyngeal exudate.  Left nostril erythematous  Eyes: Conjunctivae and EOM are normal. Pupils are equal, round, and reactive to light. Right eye exhibits no discharge. Left eye exhibits no discharge. No scleral icterus.  Neck: Normal range of motion. Neck supple. No JVD present. No thyromegaly present.  Cardiovascular: Normal rate and regular rhythm.   Murmur heard.  Systolic murmur is present with a grade of 2/6  Systolic murmurs 4/6  Pulmonary/Chest: Effort normal. No respiratory distress. She has no wheezes. She has rales (deep rattling in chest. rhonchi.). She exhibits no tenderness.  Abdominal: Soft. Bowel sounds are normal. She exhibits no distension. There is no tenderness. There is no rebound.  Musculoskeletal: Normal range of motion. She exhibits no edema (1+  bipedal) or tenderness.  Right knee deformity with osteoarthritic changes.  Lymphadenopathy:    She has no cervical adenopathy.  Neurological: She is alert and oriented to person, place, and time. She has normal reflexes. No cranial nerve deficit. She exhibits normal muscle tone. Coordination normal.  04/21/14 MMSE 28/30. Failed clock drawing.  Skin: Skin is warm and dry. No rash noted. She is not  diaphoretic. No erythema.  The left upper outer breast s/p lumpectomy  Psychiatric: She has a normal mood and affect. Her behavior is normal. Judgment and thought content normal.    Labs reviewed:  Recent Labs  03/07/15 1321  05/23/15 07/20/15 10/05/15  NA 143  < > 142 142 143  K 4.2  < > 4.1 4.1 3.6  CL 106  --   --   --   --   CO2 24  --   --   --   --   GLUCOSE 105*  --   --   --   --   BUN 24*  < > 25* 85* 22*  CREATININE 2.66*  < > 2.3* 2.2* 2.2*  CALCIUM 9.5  --   --   --   --   < > = values in this interval not displayed.  Recent Labs  03/07/15 1321 05/16/15 05/23/15 10/05/15  AST 16 12* 13 12*  ALT 7* 5* 6* 5*  ALKPHOS 44 44 44 36  BILITOT 0.7  --   --   --   PROT 6.7  --   --   --   ALBUMIN 3.8  --   --   --     Recent Labs  03/07/15 1321  05/23/15 07/20/15 10/05/15  WBC 5.6  < > 4.6 5.3 3.9  NEUTROABS 3.3  --   --   --   --   HGB 11.7*  < > 11.3* 10.3* 11.5*  HCT 35.9*  < > 35* 31* 34*  MCV 92.8  --   --   --   --   PLT 183  < > 192 178 151  < > = values in this interval not displayed. Lab Results  Component Value Date   TSH 1.55 10/05/2015   Lab Results  Component Value Date   HGBA1C 5.6 11/06/2014   Lab Results  Component Value Date   CHOL 108 07/20/2015   HDL 45 07/20/2015   LDLCALC 39 07/20/2015   TRIG 121 07/20/2015   CHOLHDL 3.1 11/06/2014    Significant Diagnostic Results in last 30 days:  No results found.  Assessment/Plan Hypertension Permissive blood pressure control, continue Carvedilol 3.125mg  bid, Clonidine 0.1mg  bid, Imdur 60mg , Furosemide 20mg , 10/05/15 Hgb 11.5, Na 143, K 3.6, Bun 22, creat 2.16, TSH1.55   Chronic diastolic congestive heart failure ([place]) 10/05/15 Hgb 11.5, Na 143, K 3.6, Bun 22, creat 2.16, TSH1.55. Compensated clinically, continue Furosemide 20mg  and Kcl 62meq  GERD (gastroesophageal reflux disease) Stable, continue Omeprazole 20mg  daily.    CKD (chronic kidney disease) stage 3, GFR 30-59  ml/min 10/05/15 Bun 22, creat 2.16  Hypertonicity of bladder Urinary leakage.     Edema Not apparent BLE, continue Furosemide 20mg  daily, Kcl 53meq,  10/05/15 Hgb 11.5, Na 143, K 3.6, Bun 22, creat 2.16, TSH1.55    Anemia Last Hgb 11.5 10/05/15    Right knee pain Brace R knee  Urine, incontinence, stress female Urinary leakage.    Anxiety state Stable, cont Mirtazapine 7.5mg  nightly      Family/ staff Communication: AL  Labs/tests ordered:  none

## 2016-02-01 NOTE — Assessment & Plan Note (Signed)
10/05/15 Bun 22, creat 2.16

## 2016-02-01 NOTE — Assessment & Plan Note (Signed)
Brace R knee

## 2016-02-01 NOTE — Assessment & Plan Note (Signed)
Stable, continue Omeprazole 20mg daily.  

## 2016-02-01 NOTE — Assessment & Plan Note (Signed)
Permissive blood pressure control, continue Carvedilol 3.125mg  bid, Clonidine 0.1mg  bid, Imdur 60mg , Furosemide 20mg , 10/05/15 Hgb 11.5, Na 143, K 3.6, Bun 22, creat 2.16, TSH1.55

## 2016-02-01 NOTE — Assessment & Plan Note (Signed)
Stable, cont Mirtazapine 7.5mg  nightly

## 2016-02-01 NOTE — Assessment & Plan Note (Signed)
Not apparent BLE, continue Furosemide 20mg  daily, Kcl 38meq,  10/05/15 Hgb 11.5, Na 143, K 3.6, Bun 22, creat 2.16, TSH1.55

## 2016-02-01 NOTE — Assessment & Plan Note (Signed)
Last Hgb 11.5 10/05/15

## 2016-03-04 ENCOUNTER — Non-Acute Institutional Stay: Payer: Medicare Other | Admitting: Internal Medicine

## 2016-03-04 ENCOUNTER — Encounter: Payer: Self-pay | Admitting: Internal Medicine

## 2016-03-04 DIAGNOSIS — N183 Chronic kidney disease, stage 3 unspecified: Secondary | ICD-10-CM

## 2016-03-04 DIAGNOSIS — M25561 Pain in right knee: Secondary | ICD-10-CM

## 2016-03-04 DIAGNOSIS — I5032 Chronic diastolic (congestive) heart failure: Secondary | ICD-10-CM

## 2016-03-04 DIAGNOSIS — N393 Stress incontinence (female) (male): Secondary | ICD-10-CM

## 2016-03-04 DIAGNOSIS — I10 Essential (primary) hypertension: Secondary | ICD-10-CM | POA: Diagnosis not present

## 2016-03-04 DIAGNOSIS — G8929 Other chronic pain: Secondary | ICD-10-CM | POA: Diagnosis not present

## 2016-03-04 NOTE — Progress Notes (Signed)
Progress Note    Location:  [place] Room Number: [phone] Place of Service:  ALF [phone]) Provider:  Jeanmarie Hubert, MD  Patient Care Team: Estill Dooms, MD as PCP - General (Internal Medicine) Juanita Craver, MD as Consulting Physician (Gastroenterology) Shon Hough, MD as Consulting Physician (Ophthalmology) Thornell Sartorius, MD as Consulting Physician (Otolaryngology) [place] Otho Darner, NP as Nurse Practitioner (Nurse Practitioner) Bo Merino, MD as Consulting Physician (Rheumatology) Suella Broad, MD as Consulting Physician (Physical Medicine and Rehabilitation) Netta Cedars, MD as Consulting Physician (Orthopedic Surgery) Fanny Skates, MD as Consulting Physician (General Surgery) Truitt Merle, MD as Consulting Physician (Hematology) Thea Silversmith, MD as Consulting Physician (Radiation Oncology) Rockwell Germany, RN as Registered Nurse Mauro Kaufmann, RN as Registered Nurse Sylvan Cheese, NP as Nurse Practitioner (Nurse Practitioner) [name], MD as Consulting Physician (Cardiology)  Extended Emergency Contact Information Primary Emergency Contact: [name],[name] Address: [address]          [place] [address] [place] Litter of [place] Phone: [phone] Mobile Phone: [phone] Relation: Daughter Secondary Emergency Contact: [name],[name]  [place] Mobile Phone: [phone] Relation: None  Code Status:  Full code Goals of care: Advanced Directive information Advanced Directives 03/04/2016  Does Patient Have a Medical Advance Directive? Yes  Type of Advance Directive [place]  Does patient want to make changes to medical advance directive? -  Copy of [place] in Chart? Yes  Pre-existing out of facility DNR order (yellow form or pink MOST form) -     Chief Complaint  Patient presents with  . Medical Management of Chronic Issues    routine visit    HPI:  Pt is a 83 y.o. female seen today for medical management of chronic diseases.    Chronic diastolic congestive heart failure (H[facility]) - compensated  CKD (chronic kidney disease) stage 3, GFR 30-59 ml/min - stable  Essential hypertension - controlled  Chronic pain of right knee - controlled at rest, but painfu when she attempts to get up to walk around  Urine, incontinence, stress female - denies dyspuria. Day and noght probllem. She would like a urology referral     Past Medical History:  Diagnosis Date  . Arthritis    Osteoarthritis  . Cancer of left breast [facility]) August 2016  . Cerebral atherosclerosis 12/19/2012  . Cerebral atrophy 12/19/2012  . Cerebral ischemia 12/19/2012   Microvascular   . CKD (chronic kidney disease) stage 3, GFR 30-59 ml/min 11/03/2014  . Cough   . Dementia   . Diverticulosis of colon (without mention of hemorrhage)   . Edema   . Edema   . Flat feet   . GERD (gastroesophageal reflux disease)   . Glaucoma   . Heart murmur   . Hyperlipidemia   . Hypertension   . Hypertonicity of bladder   . Ischemic cardiomyopathy 11/04/2014  . Lumbago   . Osteoarthrosis, unspecified whether generalized or localized, unspecified site   . Other malaise and fatigue   . Pain in joint, pelvic region and thigh   . Pain in joint, shoulder region   . Personal history of fall   . RLQ abdominal pain 01/17/2011  . Senile osteoporosis   . Small bowel obstruction   . STEMI (ST elevation myocardial infarction) ([place]) 11/04/2014  . Unstable gait 11/04/2014  . Urine, incontinence, stress female 09/18/2014   Past Surgical History:  Procedure Laterality Date  . BREAST  LUMPECTOMY Bilateral   . BREAST LUMPECTOMY WITH RADIOACTIVE SEED LOCALIZATION Left 03/15/2015   Procedure: LEFT BREAST LUMPECTOMY WITH RADIOACTIVE SEED LOCALIZATION;  Surgeon: Fanny Skates, MD;  Location: [place];  Service: General;  Laterality: Left;  . CARDIAC CATHETERIZATION N/A 11/08/2014    Procedure: Left Heart Cath and Coronary Angiography;  Surgeon: [name], MD;  Location: [place] CV LAB;  Service: Cardiovascular;  Laterality: N/A;  . COLON SURGERY  02/1996   Colectomy, partial for diverticular bleed 90%  . EYE SURGERY    . JOINT REPLACEMENT Left 12/2000   Left knee; Dr. Wynelle Link  . LAPAROSCOPIC LYSIS OF ADHESIONS  11/15/10   Exploratory  . TONSILLECTOMY      Allergies  Allergen Reactions  . Sulfa Antibiotics Itching    Allergies as of 03/04/2016      Reactions   Sulfa Antibiotics Itching      Medication List       Accurate as of 03/04/16 11:20 AM. Always use your most recent med list.          acetaminophen 325 MG tablet Commonly known as:  TYLENOL Take 650 mg by mouth 2 (two) times daily. Take two tablets by mouth every 6 hours as needed for mild pain or fever   alum & mag hydroxide-simeth 200-200-20 MG/5ML suspension Commonly known as:  MAALOX/MYLANTA Take by mouth. 67ml every 4 hours as needed for indigestion   ARTIFICIAL TEARS OP Apply to eye. Two drops in both eyes twice daily   atorvastatin 20 MG tablet Commonly known as:  LIPITOR Take one tablet by mouth once daily for cholesterol   BETIMOL 0.5 % ophthalmic solution Generic drug:  timolol Place 1 drop into both eyes every morning.   carvedilol 3.125 MG tablet Commonly known as:  COREG Take one tablet by mouth twice daily with a meal   cloNIDine 0.1 MG tablet Commonly known as:  CATAPRES Take 0.1 mg by mouth 2 (two) times daily. 1 tab every 8 hours as needed for blood pressure >180/100   Coenzyme Q10 50 MG Tabs Take one tablet by mouth at bedtime   furosemide 20 MG tablet Commonly known as:  LASIX Take 20 mg by mouth daily.   isosorbide mononitrate 60 MG 24 hr tablet Commonly known as:  IMDUR Take 60 mg by mouth daily.   KLOR-CON 10 10 MEQ tablet Generic drug:  potassium chloride Take 10 mEq by mouth daily.   latanoprost 0.005 % ophthalmic solution Commonly known  as:  XALATAN Place 1 drop into both eyes at bedtime.   mirtazapine 7.5 MG tablet Commonly known as:  REMERON TAKE 1 TABLET (7.5 MG TOTAL) BY MOUTH AT BEDTIME. NIGHTLY FOR APPETITE   omeprazole 20 MG capsule Commonly known as:  PRILOSEC Take 20 mg by mouth. Take one tablet once daily   ROBITUSSIN COUGH/CHEST DM MAX 10-200 MG/5ML Liqd Generic drug:  Dextromethorphan-Guaifenesin Take by mouth. Take 10 ml every 6 hours x 48 hours then discontinue   vitamin B-12 1000 MCG tablet Commonly known as:  CYANOCOBALAMIN Take 1 tablet by mouth twice daily On Mondays,Wednesdays and Fridays       Review of Systems  Constitutional: Negative for chills, diaphoresis and fever.  HENT: Positive for hearing loss. Negative for congestion, ear pain, postnasal drip, rhinorrhea, sinus pressure, sore throat and tinnitus.   Eyes: Negative.  Negative for discharge and redness.  Respiratory: Positive for cough and chest tightness. Negative for shortness of breath.   Cardiovascular: Negative for chest pain,  palpitations and leg swelling (ankles).       Resolved edema BLE. History of systolic heart failure.  Gastrointestinal: Negative for abdominal pain, constipation, diarrhea, nausea and vomiting.       History of diverticulosis. She denies any abdominal pain.  Genitourinary: Positive for frequency. Negative for dysuria, flank pain and urgency.       Urine incontinence  Musculoskeletal: Positive for back pain. Negative for myalgias and neck pain.       Flat feet with dropped arches. Pin in the right medial malleolar bones. Chronic back pain. Chronic right shoulder pain. Right knee pain. Uses 4 wheel walker.  Skin: Negative.  Negative for rash.       S/p left upper outer lumpectomy  Neurological: Negative for dizziness, tremors and weakness (Generalized).  Hematological:       History of anemia.  Psychiatric/Behavioral: The patient is not nervous/anxious.     Immunization History  Administered Date(s)  Administered  . Influenza Whole 11/19/2011, 12/02/2012  . Influenza-Unspecified 12/06/2013, 12/06/2015  . PPD Test 11/10/2014  . Pneumococcal Polysaccharide-23 02/18/2009   Pertinent  Health Maintenance Due  Topic Date Due  . PNA vac Low Risk Adult (2 of 2 - PCV13) 02/18/2010  . MAMMOGRAM  10/13/2015  . INFLUENZA VACCINE  Completed  . DEXA SCAN  Completed   Fall Risk  07/13/2015 11/17/2014 04/21/2014 12/17/2012  Falls in the past year? No Yes Yes Yes  Number falls in past yr: - 1 1 1   Injury with Fall? - No No Yes  Risk for fall due to : - Impaired balance/gait;History of fall(s) - -   Functional Status Survey:    Vitals:   03/04/16 1102  BP: 140/82  Pulse: 72  Resp: 18  Temp: 98.4 F (36.9 C)  Weight: 133 lb (60.3 kg)  Height: 5\' 4"  (1.626 m)   Body mass index is 22.83 kg/m. Physical Exam  Constitutional: She is oriented to person, place, and time. She appears well-developed and well-nourished. No distress.  HENT:  Head: Normocephalic and atraumatic.  Right Ear: External ear normal.  Left Ear: External ear normal.  Nose: Nose normal.  Mouth/Throat: Oropharynx is clear and moist. No oropharyngeal exudate.  Left nostril erythematous  Eyes: Conjunctivae and EOM are normal. Pupils are equal, round, and reactive to light. Right eye exhibits no discharge. Left eye exhibits no discharge. No scleral icterus.  Neck: Normal range of motion. Neck supple. No JVD present. No thyromegaly present.  Cardiovascular: Normal rate and regular rhythm.   Murmur heard.  Systolic murmur is present with a grade of 2/6  Systolic murmurs 4/6  Pulmonary/Chest: Effort normal. No respiratory distress. She has no wheezes. She has rales (deep rattling in chest. rhonchi.). She exhibits no tenderness.  Abdominal: Soft. Bowel sounds are normal. She exhibits no distension. There is no tenderness. There is no rebound.  Musculoskeletal: Normal range of motion. She exhibits no edema (1+  bipedal) or  tenderness.  Right knee deformity with osteoarthritic changes.  Lymphadenopathy:    She has no cervical adenopathy.  Neurological: She is alert and oriented to person, place, and time. She has normal reflexes. No cranial nerve deficit. She exhibits normal muscle tone. Coordination normal.  04/21/14 MMSE 28/30. Failed clock drawing.  Skin: Skin is warm and dry. No rash noted. She is not diaphoretic. No erythema.  The left upper outer breast s/p lumpectomy  Psychiatric: She has a normal mood and affect. Her behavior is normal. Judgment and thought content normal.  Labs reviewed:  Recent Labs  03/07/15 1321  05/23/15 07/20/15 10/05/15  NA 143  < > 142 142 143  K 4.2  < > 4.1 4.1 3.6  CL 106  --   --   --   --   CO2 24  --   --   --   --   GLUCOSE 105*  --   --   --   --   BUN 24*  < > 25* 85* 22*  CREATININE 2.66*  < > 2.3* 2.2* 2.2*  CALCIUM 9.5  --   --   --   --   < > = values in this interval not displayed.  Recent Labs  03/07/15 1321 05/16/15 05/23/15 10/05/15  AST 16 12* 13 12*  ALT 7* 5* 6* 5*  ALKPHOS 44 44 44 36  BILITOT 0.7  --   --   --   PROT 6.7  --   --   --   ALBUMIN 3.8  --   --   --     Recent Labs  03/07/15 1321  05/23/15 07/20/15 10/05/15  WBC 5.6  < > 4.6 5.3 3.9  NEUTROABS 3.3  --   --   --   --   HGB 11.7*  < > 11.3* 10.3* 11.5*  HCT 35.9*  < > 35* 31* 34*  MCV 92.8  --   --   --   --   PLT 183  < > 192 178 151  < > = values in this interval not displayed. Lab Results  Component Value Date   TSH 1.55 10/05/2015   Lab Results  Component Value Date   HGBA1C 5.6 11/06/2014   Lab Results  Component Value Date   CHOL 108 07/20/2015   HDL 45 07/20/2015   LDLCALC 39 07/20/2015   TRIG 121 07/20/2015   CHOLHDL 3.1 11/06/2014    Significant Diagnostic Results in last 30 days:  No results found.  Assessment/Plan 1. Chronic diastolic congestive heart failure ([place]) Coimpensated. The current medical regimen is effective;  continue present plan  and medications.  2. CKD (chronic kidney disease) stage 3, GFR 30-59 ml/min stable  3. Essential hypertension The current medical regimen is effective;  continue present plan and medications.  4. Chronic pain of right knee unchanged  5. Urine, incontinence, stress female -UA and culture .refer to urology for evaluation.

## 2016-03-07 DIAGNOSIS — R300 Dysuria: Secondary | ICD-10-CM | POA: Diagnosis not present

## 2016-03-28 ENCOUNTER — Non-Acute Institutional Stay (SKILLED_NURSING_FACILITY): Payer: Medicare Other | Admitting: Internal Medicine

## 2016-03-28 ENCOUNTER — Encounter: Payer: Self-pay | Admitting: Internal Medicine

## 2016-03-28 DIAGNOSIS — D631 Anemia in chronic kidney disease: Secondary | ICD-10-CM

## 2016-03-28 DIAGNOSIS — I5032 Chronic diastolic (congestive) heart failure: Secondary | ICD-10-CM | POA: Diagnosis not present

## 2016-03-28 DIAGNOSIS — N183 Chronic kidney disease, stage 3 unspecified: Secondary | ICD-10-CM

## 2016-03-28 DIAGNOSIS — M25561 Pain in right knee: Secondary | ICD-10-CM

## 2016-03-28 DIAGNOSIS — R413 Other amnesia: Secondary | ICD-10-CM | POA: Diagnosis not present

## 2016-03-28 DIAGNOSIS — G8929 Other chronic pain: Secondary | ICD-10-CM

## 2016-03-28 DIAGNOSIS — I10 Essential (primary) hypertension: Secondary | ICD-10-CM

## 2016-03-28 DIAGNOSIS — N189 Chronic kidney disease, unspecified: Secondary | ICD-10-CM

## 2016-03-28 DIAGNOSIS — R609 Edema, unspecified: Secondary | ICD-10-CM

## 2016-03-28 DIAGNOSIS — F0150 Vascular dementia without behavioral disturbance: Secondary | ICD-10-CM | POA: Insufficient documentation

## 2016-03-28 HISTORY — DX: Other amnesia: R41.3

## 2016-03-28 NOTE — Progress Notes (Signed)
HISTORY AND PHYSICAL  Location:     Nursing Home Room Number: 5 Place of Service: SNF (31)   Extended Emergency Contact Information Primary Emergency Contact: [name],[name] Address: [address]          [place] [address] Johnnette Litter of [place] Phone: [phone] Mobile Phone: [phone] Relation: Daughter Secondary Emergency Contact: [name],[name]  [place] Mobile Phone: [phone] Relation: None  Advanced Directive information Does Patient Have a Medical Advance Directive?: Yes, Type of Advance Directive: [place];Out of facility DNR (pink MOST or yellow form), Does patient want to make changes to medical advance directive?: No - Patient declined  Chief Complaint  Patient presents with  . New Admit To SNF    HPI:  Resident moved to [facility] from [facility] on 03/27/16. She is requiring moree assistance with virtually all ADL. She has moderate dementia and is a fall risk.  Dementia is consistent with vacsular dementia, although I cannot rule out a component of Alzheimer's.  She complains of right knee pain and instability that is leading to increased gait instability. She has appt with ortho soon. She wears a brace. She says she will never have surgery, but is willing to go ahead with more knee injectons.  Past Medical History:  Diagnosis Date  . Arthritis    Osteoarthritis  . Cancer of left breast [facility]) August 2016  . Cerebral atherosclerosis 12/19/2012  . Cerebral atrophy 12/19/2012  . Cerebral ischemia 12/19/2012   Microvascular   . CKD (chronic kidney disease) stage 3, GFR 30-59 ml/min 11/03/2014  . Cough   . Dementia   . Diverticulosis of colon (without mention of hemorrhage)   . Edema   . Flat feet   . GERD (gastroesophageal reflux disease)   . Glaucoma   . Heart murmur   . Hyperlipidemia   . Hypertension   . Hypertonicity of bladder   . Ischemic cardiomyopathy 11/04/2014  . Lumbago   . Osteoarthrosis,  unspecified whether generalized or localized, unspecified site   . Other malaise and fatigue   . Pain in joint, pelvic region and thigh   . Pain in joint, shoulder region   . Personal history of fall   . RLQ abdominal pain 01/17/2011  . Senile osteoporosis   . Small bowel obstruction   . STEMI (ST elevation myocardial infarction) ([place]) 11/04/2014  . Unstable gait 11/04/2014  . Urine, incontinence, stress female 09/18/2014    Past Surgical History:  Procedure Laterality Date  . BREAST LUMPECTOMY Bilateral   . BREAST LUMPECTOMY WITH RADIOACTIVE SEED LOCALIZATION Left 03/15/2015   Procedure: LEFT BREAST LUMPECTOMY WITH RADIOACTIVE SEED LOCALIZATION;  Surgeon: Fanny Skates, MD;  Location: [place];  Service: General;  Laterality: Left;  . CARDIAC CATHETERIZATION N/A 11/08/2014   Procedure: Left Heart Cath and Coronary Angiography;  Surgeon: [name], MD;  Location: [place] [facility];  Service: Cardiovascular;  Laterality: N/A;  . COLON SURGERY  02/1996   Colectomy, partial for diverticular bleed 90%  . EYE SURGERY    . JOINT REPLACEMENT Left 12/2000   Left knee; Dr. Wynelle Link  . LAPAROSCOPIC LYSIS OF ADHESIONS  11/15/10   Exploratory  . TONSILLECTOMY      Patient Care Team: Estill Dooms, MD as PCP - General (Internal Medicine) Juanita Craver, MD as Consulting Physician (Gastroenterology) Shon Hough, MD as Consulting Physician (Ophthalmology) Thornell Sartorius, MD as Consulting Physician (Otolaryngology) Gonzalez Man Otho Darner, NP as Nurse Practitioner (  Nurse Practitioner) Bo Merino, MD as Consulting Physician (Rheumatology) Suella Broad, MD as Consulting Physician (Physical Medicine and Rehabilitation) Netta Cedars, MD as Consulting Physician (Orthopedic Surgery) Fanny Skates, MD as Consulting Physician (General Surgery) Truitt Merle, MD as Consulting Physician (Hematology) Thea Silversmith, MD as Consulting Physician (Radiation Oncology) Rockwell Germany, RN  as Registered Nurse Mauro Kaufmann, RN as Registered Nurse Sylvan Cheese, NP as Nurse Practitioner (Nurse Practitioner) [name], MD as Consulting Physician (Cardiology)  Social History   Social History  . Marital status: Widowed    Spouse name: N/A  . Number of children: N/A  . Years of education: N/A   Occupational History  . Not on file.   Social History Main Topics  . Smoking status: Former Smoker    Packs/day: 0.50    Years: 30.00    Types: Cigarettes    Quit date: 12/16/1980  . Smokeless tobacco: Never Used  . Alcohol use 0.6 oz/week    1 Glasses of wine per week     Comment: occasional glass of wine  . Drug use: No  . Sexual activity: No   Other Topics Concern  . Not on file   Social History Narrative   Lives at [facility] 05/2011   Widowed 2003   Living Will   [place] with cane   Never smoked    Exercise none     reports that she quit smoking about 35 years ago. Her smoking use included Cigarettes. She has a 15.00 pack-year smoking history. She has never used smokeless tobacco. She reports that she drinks about 0.6 oz of alcohol per week . She reports that she does not use drugs.  Family History  Problem Relation Age of Onset  . Cancer Father 27    colon  . Cancer Maternal Aunt     breast cancer   . Cancer Cousin     breast cancer    Family Status  Relation Status  . Father Deceased at age 34   Cause of Death: Colon cancer  . Mother Deceased at age 15   Cause of Death: Unknown  . Brother Alive  . Daughter Alive  . Maternal Aunt   . Cousin     Immunization History  Administered Date(s) Administered  . Influenza Whole 11/19/2011, 12/02/2012  . Influenza-Unspecified 12/06/2013, 12/06/2015  . PPD Test 11/10/2014  . Pneumococcal Polysaccharide-23 02/18/2009    Allergies  Allergen Reactions  . Sulfa Antibiotics Itching    Medications: Patient's Medications  New Prescriptions   No medications on file  Previous  Medications   ACETAMINOPHEN (TYLENOL) 325 MG TABLET    Take 650 mg by mouth 2 (two) times daily. Take two tablets by mouth every 6 hours as needed for mild pain or fever   ALUM & MAG HYDROXIDE-SIMETH (MAALOX/MYLANTA) 200-200-20 MG/5ML SUSPENSION    Take by mouth. 68m every 4 hours as needed for indigestion   ATORVASTATIN (LIPITOR) 20 MG TABLET    Take one tablet by mouth once daily for cholesterol   BETIMOL 0.5 % OPHTHALMIC SOLUTION    Place 1 drop into both eyes every morning.    CARVEDILOL (COREG) 3.125 MG TABLET    Take one tablet by mouth twice daily with a meal   CLONIDINE (CATAPRES) 0.1 MG TABLET    Take 0.1 mg by mouth 2 (two) times daily. 1 tab every 8 hours as needed for blood pressure >180/100   COENZYME Q10 50 MG TABS    Take  one tablet by mouth at bedtime   DEXTROMETHORPHAN-GUAIFENESIN (ROBITUSSIN COUGH/CHEST DM MAX) 10-200 MG/5ML LIQD    Take by mouth. Take 10 ml every 6 hours x 48 hours then discontinue   FUROSEMIDE (LASIX) 20 MG TABLET    Take 20 mg by mouth daily.   HYPROMELLOSE (ARTIFICIAL TEARS OP)    Apply to eye. Two drops in both eyes twice daily   ISOSORBIDE MONONITRATE (IMDUR) 60 MG 24 HR TABLET    Take 60 mg by mouth daily.   KLOR-CON 10 10 MEQ TABLET    Take 10 mEq by mouth daily.   LATANOPROST (XALATAN) 0.005 % OPHTHALMIC SOLUTION    Place 1 drop into both eyes at bedtime.    MIRTAZAPINE (REMERON) 7.5 MG TABLET    TAKE 1 TABLET (7.5 MG TOTAL) BY MOUTH AT BEDTIME. NIGHTLY FOR APPETITE   OMEPRAZOLE (PRILOSEC) 20 MG CAPSULE    Take 20 mg by mouth. Take one tablet once daily   VITAMIN B-12 (CYANOCOBALAMIN) 1000 MCG TABLET    Take 1 tablet by mouth twice daily On Mondays,Wednesdays and Fridays  Modified Medications   No medications on file  Discontinued Medications   No medications on file    Review of Systems  Constitutional: Negative for chills, diaphoresis and fever.  HENT: Positive for hearing loss. Negative for congestion, ear pain, postnasal drip, rhinorrhea,  sinus pressure, sore throat and tinnitus.   Eyes: Negative.  Negative for discharge and redness.  Respiratory: Positive for cough and chest tightness. Negative for shortness of breath.   Cardiovascular: Negative for chest pain, palpitations and leg swelling (ankles).       Resolved edema BLE. History of systolic heart failure.  Gastrointestinal: Negative for abdominal pain, constipation, diarrhea, nausea and vomiting.       History of diverticulosis. She denies any abdominal pain.  Genitourinary: Positive for frequency. Negative for dysuria, flank pain and urgency.       Urine incontinence  Musculoskeletal: Positive for back pain. Negative for myalgias and neck pain.       Flat feet with dropped arches. Pin in the right medial malleolar bones. Chronic back pain. Chronic right shoulder pain. Right knee pain. Uses 4 wheel walker.  Skin: Negative.  Negative for rash.       S/p left upper outer lumpectomy  Neurological: Negative for dizziness, tremors and weakness (Generalized).  Hematological:       History of anemia.  Psychiatric/Behavioral: The patient is not nervous/anxious.     Vitals:   03/28/16 1634  BP: 122/86  Pulse: 70  Resp: 14  Temp: 98.1 F (36.7 C)  Weight: 133 lb (60.3 kg)  Height: '5\' 4"'$  (1.626 m)   Body mass index is 22.83 kg/m. Filed Weights   03/28/16 1634  Weight: 133 lb (60.3 kg)     Physical Exam  Constitutional: She is oriented to person, place, and time. She appears well-developed and well-nourished. No distress.  HENT:  Head: Normocephalic and atraumatic.  Right Ear: External ear normal.  Left Ear: External ear normal.  Nose: Nose normal.  Mouth/Throat: Oropharynx is clear and moist. No oropharyngeal exudate.  Left nostril erythematous  Eyes: Conjunctivae and EOM are normal. Pupils are equal, round, and reactive to light. Right eye exhibits no discharge. Left eye exhibits no discharge. No scleral icterus.  Neck: Normal range of motion. Neck  supple. No JVD present. No thyromegaly present.  Cardiovascular: Normal rate and regular rhythm.   Murmur heard.  Systolic murmur is present with  a grade of 2/6  Systolic murmurs 4/6  Pulmonary/Chest: Effort normal. No respiratory distress. She has no wheezes. She has rales (deep rattling in chest. rhonchi.). She exhibits no tenderness.  Abdominal: Soft. Bowel sounds are normal. She exhibits no distension. There is no tenderness. There is no rebound.  Musculoskeletal: Normal range of motion. She exhibits no edema (1+  bipedal) or tenderness.  Right knee deformity with osteoarthritic changes.  Lymphadenopathy:    She has no cervical adenopathy.  Neurological: She is alert and oriented to person, place, and time. She has normal reflexes. No cranial nerve deficit. She exhibits normal muscle tone. Coordination normal.  04/21/14 MMSE 28/30. Failed clock drawing.  Skin: Skin is warm and dry. No rash noted. She is not diaphoretic. No erythema.  The left upper outer breast s/p lumpectomy  Psychiatric: She has a normal mood and affect. Her behavior is normal. Judgment and thought content normal.    Labs reviewed: Lab Summary Latest Ref Rng & Units 10/05/2015 07/20/2015 05/23/2015  Hemoglobin 12.0 - 16.0 g/dL 11.5(A) 10.3(A) 11.3(A)  Hematocrit 36 - 46 % 34(A) 31(A) 35(A)  White count 10:3/mL 3.9 5.3 4.6  Platelet count 150 - 399 K/L 151 178 192  Sodium 137 - 147 mmol/L 143 142 142  Potassium 3.4 - 5.3 mmol/L 3.6 4.1 4.1  Calcium - (None) (None) (None)  Phosphorus - (None) (None) (None)  Creatinine 0.5 - 1.1 mg/dL 2.2(A) 2.2(A) 2.3(A)  AST 13 - 35 U/L 12(A) (None) 13  Alk Phos 25 - 125 U/L 36 (None) 44  Bilirubin - (None) (None) (None)  Glucose mg/dL 87 85 79  Cholesterol 0 - 200 mg/dL (None) 108 (None)  HDL cholesterol 35 - 70 mg/dL (None) 45 (None)  Triglycerides 40 - 160 mg/dL (None) 121 (None)  LDL Direct - (None) (None) (None)  LDL Calc mg/dL (None) 39 (None)  Total protein - (None) (None)  (None)  Albumin - (None) (None) (None)  Some recent data might be hidden   Lab Results  Component Value Date   BUN 22 (A) 10/05/2015   Lab Results  Component Value Date   HGBA1C 5.6 11/06/2014   Lab Results  Component Value Date   TSH 1.55 10/05/2015   Assessment/Plan  1. Memory change Gradually worsening. Conversant. Mild to moderate loss.   2. Anemia in chronic kidney disease, unspecified CKD stage stable  3. Chronic diastolic congestive heart failure (HCC) compensated  4. CKD (chronic kidney disease) stage 3, GFR 30-59 ml/min stable  5. Edema, unspecified type improved  6. Essential hypertension controlled  7. Chronic pain of right knee PT referral. See ortho as planned

## 2016-04-02 ENCOUNTER — Emergency Department (HOSPITAL_COMMUNITY): Payer: Medicare Other

## 2016-04-02 ENCOUNTER — Encounter (HOSPITAL_COMMUNITY): Payer: Self-pay | Admitting: Emergency Medicine

## 2016-04-02 ENCOUNTER — Emergency Department (HOSPITAL_COMMUNITY)
Admission: EM | Admit: 2016-04-02 | Discharge: 2016-04-02 | Disposition: A | Discharge status: Home / Self Care | Payer: Medicare Other | Attending: Emergency Medicine | Admitting: Emergency Medicine

## 2016-04-02 DIAGNOSIS — I252 Old myocardial infarction: Secondary | ICD-10-CM | POA: Insufficient documentation

## 2016-04-02 DIAGNOSIS — S199XXA Unspecified injury of neck, initial encounter: Secondary | ICD-10-CM | POA: Diagnosis not present

## 2016-04-02 DIAGNOSIS — Y999 Unspecified external cause status: Secondary | ICD-10-CM | POA: Insufficient documentation

## 2016-04-02 DIAGNOSIS — W19XXXA Unspecified fall, initial encounter: Secondary | ICD-10-CM

## 2016-04-02 DIAGNOSIS — S0190XA Unspecified open wound of unspecified part of head, initial encounter: Secondary | ICD-10-CM | POA: Diagnosis not present

## 2016-04-02 DIAGNOSIS — Z8673 Personal history of transient ischemic attack (TIA), and cerebral infarction without residual deficits: Secondary | ICD-10-CM | POA: Diagnosis not present

## 2016-04-02 DIAGNOSIS — I129 Hypertensive chronic kidney disease with stage 1 through stage 4 chronic kidney disease, or unspecified chronic kidney disease: Secondary | ICD-10-CM | POA: Diagnosis not present

## 2016-04-02 DIAGNOSIS — S0101XA Laceration without foreign body of scalp, initial encounter: Secondary | ICD-10-CM

## 2016-04-02 DIAGNOSIS — S80211A Abrasion, right knee, initial encounter: Secondary | ICD-10-CM | POA: Diagnosis not present

## 2016-04-02 DIAGNOSIS — S8991XA Unspecified injury of right lower leg, initial encounter: Secondary | ICD-10-CM | POA: Diagnosis not present

## 2016-04-02 DIAGNOSIS — Y939 Activity, unspecified: Secondary | ICD-10-CM | POA: Insufficient documentation

## 2016-04-02 DIAGNOSIS — W1839XA Other fall on same level, initial encounter: Secondary | ICD-10-CM | POA: Diagnosis not present

## 2016-04-02 DIAGNOSIS — Z87891 Personal history of nicotine dependence: Secondary | ICD-10-CM | POA: Diagnosis not present

## 2016-04-02 DIAGNOSIS — Z23 Encounter for immunization: Secondary | ICD-10-CM | POA: Insufficient documentation

## 2016-04-02 DIAGNOSIS — S0990XA Unspecified injury of head, initial encounter: Secondary | ICD-10-CM | POA: Diagnosis not present

## 2016-04-02 DIAGNOSIS — F0280 Dementia in other diseases classified elsewhere without behavioral disturbance: Secondary | ICD-10-CM | POA: Diagnosis not present

## 2016-04-02 DIAGNOSIS — Y929 Unspecified place or not applicable: Secondary | ICD-10-CM | POA: Insufficient documentation

## 2016-04-02 DIAGNOSIS — N183 Chronic kidney disease, stage 3 (moderate): Secondary | ICD-10-CM | POA: Diagnosis not present

## 2016-04-02 DIAGNOSIS — Z96652 Presence of left artificial knee joint: Secondary | ICD-10-CM | POA: Insufficient documentation

## 2016-04-02 MED ORDER — HYDROGEN PEROXIDE 3 % EX SOLN
Freq: Once | CUTANEOUS | Status: AC
  Administered 2016-04-02: 11:00:00 via TOPICAL
  Filled 2016-04-02: qty 473

## 2016-04-02 MED ORDER — CLONIDINE HCL 0.1 MG PO TABS
0.1000 mg | ORAL_TABLET | Freq: Once | ORAL | Status: AC
  Administered 2016-04-02: 0.1 mg via ORAL
  Filled 2016-04-02: qty 1

## 2016-04-02 MED ORDER — TETANUS-DIPHTH-ACELL PERTUSSIS 5-2.5-18.5 LF-MCG/0.5 IM SUSP
0.5000 mL | Freq: Once | INTRAMUSCULAR | Status: AC
  Administered 2016-04-02: 0.5 mL via INTRAMUSCULAR
  Filled 2016-04-02: qty 0.5

## 2016-04-02 MED ORDER — LIDOCAINE-EPINEPHRINE-TETRACAINE (LET) SOLUTION
3.0000 mL | Freq: Once | NASAL | Status: AC
  Administered 2016-04-02: 3 mL via TOPICAL
  Filled 2016-04-02: qty 3

## 2016-04-02 MED ORDER — BACITRACIN ZINC 500 UNIT/GM EX OINT
TOPICAL_OINTMENT | Freq: Two times a day (BID) | CUTANEOUS | Status: DC
  Administered 2016-04-02: 1 via TOPICAL
  Filled 2016-04-02: qty 0.9

## 2016-04-02 MED ORDER — CARVEDILOL 3.125 MG PO TABS
3.1250 mg | ORAL_TABLET | Freq: Once | ORAL | Status: AC
  Administered 2016-04-02: 3.125 mg via ORAL
  Filled 2016-04-02: qty 1

## 2016-04-02 NOTE — ED Notes (Signed)
ED Provider at bedside. 

## 2016-04-02 NOTE — ED Notes (Signed)
EDP aware of patient's vital signs and states patient can be discharged.

## 2016-04-02 NOTE — ED Notes (Signed)
Bed: WA14 Expected date:  Expected time:  Means of arrival:  Comments: ems 

## 2016-04-02 NOTE — ED Provider Notes (Signed)
[facility] DEPT Provider Note   CSN: [id number] Arrival date & time: 04/02/16  0734     History   Chief Complaint Chief Complaint  Patient presents with  . Fall    HPI [name] is a 83 y.o. female.  The history is provided by the patient, medical records, the EMS personnel and the nursing home.  Fall     Level V caveat: Dementia 83 year old female with history of chronic kidney disease, dementia, GERD, hypertension, hyperlipidemia, history of unsteady gait, presenting to the ED after a fall. Patient reports she got out of bed this morning to go use the bathroom when her right knee "gave out". States she has a history of chronic right knee issues and gets injections in her knee for this. She reports she fell backwards and struck her head on the floor. No loss of consciousness. She did sustain a small laceration to the back of her head. She reports a mild headache but denies any neck or back pain. She denies any dizziness or confusion. No visual disturbance.  She is not currently on anticoagulation. Date of last tetanus unknown.  Past Medical History:  Diagnosis Date  . Arthritis    Osteoarthritis  . Cancer of left breast [facility]) August 2016  . Cerebral atherosclerosis 12/19/2012  . Cerebral atrophy 12/19/2012  . Cerebral ischemia 12/19/2012   Microvascular   . CKD (chronic kidney disease) stage 3, GFR 30-59 ml/min 11/03/2014  . Cough   . Dementia   . Diverticulosis of colon (without mention of hemorrhage)   . Edema   . Flat feet   . GERD (gastroesophageal reflux disease)   . Glaucoma   . Heart murmur   . Hyperlipidemia   . Hypertension   . Hypertonicity of bladder   . Ischemic cardiomyopathy 11/04/2014  . Lumbago   . Memory change 03/28/2016  . Osteoarthrosis, unspecified whether generalized or localized, unspecified site   . Other malaise and fatigue   . Pain in joint, pelvic region and thigh   . Pain in joint, shoulder region   . Personal history of fall   .  RLQ abdominal pain 01/17/2011  . Senile osteoporosis   . Small bowel obstruction   . STEMI (ST elevation myocardial infarction) ([place]) 11/04/2014  . Unstable gait 11/04/2014  . Urine, incontinence, stress female 09/18/2014    Patient Active Problem List   Diagnosis Date Noted  . Memory change 03/28/2016  . UTI (urinary tract infection) 12/15/2014  . CKD (chronic kidney disease) stage 3, GFR 30-59 ml/min 11/03/2014  . Breast cancer of upper-outer quadrant of left female breast ([place]) 10/17/2014  . Urine, incontinence, stress female 09/18/2014  . Right shoulder pain 03/21/2014  . CVA (cerebral vascular accident) ([place]) 03/14/2014  . Wedge compression fracture of T6 vertebra ([place]) 03/02/2014  . Right knee pain 02/06/2014  . Dyspnea 01/16/2014  . Anxiety state 01/16/2014  . Cerebral ischemia 12/19/2012  . Cerebral atrophy 12/19/2012  . Cerebral atherosclerosis 12/19/2012  . Anemia 12/19/2012  . Chronic diastolic congestive heart failure ([place]) 09/10/2012  . Flat feet   . Lumbago   . Hypertension   . Pain in joint, shoulder region   . Hypertonicity of bladder   . Edema   . Hyperlipidemia   . GERD (gastroesophageal reflux disease)     Past Surgical History:  Procedure Laterality Date  . BREAST LUMPECTOMY Bilateral   . BREAST LUMPECTOMY WITH RADIOACTIVE SEED LOCALIZATION Left 03/15/2015   Procedure: LEFT BREAST LUMPECTOMY WITH RADIOACTIVE  SEED LOCALIZATION;  Surgeon: Fanny Skates, MD;  Location: [facility];  Service: General;  Laterality: Left;  . CARDIAC CATHETERIZATION N/A 11/08/2014   Procedure: Left Heart Cath and Coronary Angiography;  Surgeon: [name], MD;  Location: [facility];  Service: Cardiovascular;  Laterality: N/A;  . COLON SURGERY  02/1996   Colectomy, partial for diverticular bleed 90%  . EYE SURGERY    . JOINT REPLACEMENT Left 12/2000   Left knee; Dr. Wynelle Link  . LAPAROSCOPIC LYSIS OF ADHESIONS  11/15/10   Exploratory  . TONSILLECTOMY      OB History     No data available       Home Medications    Prior to Admission medications   Medication Sig Start Date End Date Taking? Authorizing Provider  acetaminophen (TYLENOL) 325 MG tablet Take 650 mg by mouth 2 (two) times daily. Take two tablets by mouth every 6 hours as needed for mild pain or fever    Historical Provider, MD  alum & mag hydroxide-simeth (MAALOX/MYLANTA) 200-200-20 MG/5ML suspension Take by mouth. 77ml every 4 hours as needed for indigestion    Historical Provider, MD  atorvastatin (LIPITOR) 20 MG tablet Take one tablet by mouth once daily for cholesterol 04/21/14   Tiffany L Reed, DO  BETIMOL 0.5 % ophthalmic solution Place 1 drop into both eyes every morning.  09/23/10   Historical Provider, MD  carvedilol (COREG) 3.125 MG tablet Take one tablet by mouth twice daily with a meal 05/06/14   Estill Dooms, MD  cloNIDine (CATAPRES) 0.1 MG tablet Take 0.1 mg by mouth 2 (two) times daily. 1 tab every 8 hours as needed for blood pressure >180/100    Historical Provider, MD  Coenzyme Q10 50 MG TABS Take one tablet by mouth at bedtime    Historical Provider, MD  Dextromethorphan-Guaifenesin (ROBITUSSIN COUGH/CHEST DM MAX) 10-200 MG/5ML LIQD Take by mouth. Take 10 ml every 6 hours x 48 hours then discontinue    Historical Provider, MD  furosemide (LASIX) 20 MG tablet Take 20 mg by mouth daily.    Historical Provider, MD  Hypromellose (ARTIFICIAL TEARS OP) Apply to eye. Two drops in both eyes twice daily    Historical Provider, MD  isosorbide mononitrate (IMDUR) 60 MG 24 hr tablet Take 60 mg by mouth daily.    Historical Provider, MD  KLOR-CON 10 10 MEQ tablet Take 10 mEq by mouth daily. 02/06/15   Historical Provider, MD  latanoprost (XALATAN) 0.005 % ophthalmic solution Place 1 drop into both eyes at bedtime.     Historical Provider, MD  mirtazapine (REMERON) 7.5 MG tablet TAKE 1 TABLET (7.5 MG TOTAL) BY MOUTH AT BEDTIME. NIGHTLY FOR APPETITE 03/15/14   Estill Dooms, MD  omeprazole (PRILOSEC)  20 MG capsule Take 20 mg by mouth. Take one tablet once daily    Historical Provider, MD  vitamin B-12 (CYANOCOBALAMIN) 1000 MCG tablet Take 1 tablet by mouth twice daily On Mondays,Wednesdays and Fridays    Historical Provider, MD    Family History Family History  Problem Relation Age of Onset  . Cancer Father 66    colon  . Cancer Maternal Aunt     breast cancer   . Cancer Cousin     breast cancer     Social History Social History  Substance Use Topics  . Smoking status: Former Smoker    Packs/day: 0.50    Years: 30.00    Types: Cigarettes    Quit date: 12/16/1980  .  Smokeless tobacco: Never Used  . Alcohol use 0.6 oz/week    1 Glasses of wine per week     Comment: occasional glass of wine     Allergies   Sulfa antibiotics   Review of Systems Review of Systems  Unable to perform ROS: Dementia     Physical Exam Updated Vital Signs BP (!) 231/95 (BP Location: Left Arm) Comment: EDP made aware  Pulse 73   Temp 97.5 F (36.4 C) (Oral)   Ht 5\' 4"  (1.626 m)   Wt 60.3 kg   SpO2 95%   BMI 22.83 kg/m   Physical Exam  Constitutional: She is oriented to person, place, and time. She appears well-developed and well-nourished.  HENT:  Head: Normocephalic and atraumatic.  Mouth/Throat: Oropharynx is clear and moist.  1 cm laceration noted to posterior scalp, soft tissue hematoma noted, no skull depression, no bruising of the face or behind the ears, midface stable without tenderness  Eyes: Conjunctivae and EOM are normal. Pupils are equal, round, and reactive to light.  Neck: Normal range of motion.  Cardiovascular: Normal rate, regular rhythm and normal heart sounds.   Pulmonary/Chest: Effort normal and breath sounds normal.  Abdominal: Soft. Bowel sounds are normal.  Musculoskeletal: Normal range of motion.  Small abrasion of the right lateral knee, there is no some mild swelling centrally, no bony deformity, no pain with flexion or extension; DP pulse intact, no  leg shortening Pelvis is stable and non-tender  Neurological: She is alert and oriented to person, place, and time.  Awake, alert, oriented, moving arms and legs without difficulty, answering questions and following commands when prompted  Skin: Skin is warm and dry.  Psychiatric: She has a normal mood and affect.  Nursing note and vitals reviewed.    ED Treatments / Results  Labs (all labs ordered are listed, but only abnormal results are displayed) Labs Reviewed - No data to display  EKG  EKG Interpretation None       Radiology Ct Head Wo Contrast  Result Date: 04/02/2016 CLINICAL DATA:  83 year old female status post fall this morning, struck back of head. Initial encounter. EXAM: CT HEAD WITHOUT CONTRAST CT CERVICAL SPINE WITHOUT CONTRAST TECHNIQUE: Multidetector CT imaging of the head and cervical spine was performed following the standard protocol without intravenous contrast. Multiplanar CT image reconstructions of the cervical spine were also generated. COMPARISON:  Head CT without contrast 03/16/2014 and earlier. FINDINGS: CT HEAD FINDINGS Brain: Mild generalized cerebral volume loss since 2014, volume remains within normal limits for age. Chronic lacunar infarcts in the deep gray matter including the left caudate are stable since 2016. Superimposed mild for age patchy bilateral white matter hypodensity. No midline shift, ventriculomegaly, mass effect, evidence of mass lesion, intracranial hemorrhage or evidence of cortically based acute infarction. No cortical encephalomalacia identified. Vascular: Chronic Calcified atherosclerosis at the skull base. No suspicious intracranial vascular hyperdensity. Skull: Intact.  No acute osseous abnormality identified. Sinuses/Orbits: Visualized paranasal sinuses and mastoids are stable and well pneumatized. Other: Right posterosuperior convexity scalp laceration with trace soft tissue gas. Associated broad-based scalp hematoma measuring up to 5  mm in thickness. Underlying calvarium intact. Other visible orbit and scalp soft tissues are negative. CT CERVICAL SPINE FINDINGS Alignment: Reversal of cervical lordosis with multilevel spondylolisthesis. Anterolisthesis of C3 on C4 measuring up to 3 mm. Trace anterolisthesis C4-C5. Mild retrolisthesis C5-C6 and C6-C7. Mild anterolisthesis C7-T1 measuring 1-2 mm. Similar mild anterolisthesis T1-T2. Bilateral posterior element alignment is within normal limits. Skull  base and vertebrae: Visualized skull base is intact. No atlanto-occipital dissociation. Degenerative partially calcified ligamentous hypertrophy about the odontoid. No acute cervical spine fracture. Benign appearing C4 vertebral body sclerotic focus (sagittal image 22). Soft tissues and spinal canal: No prevertebral fluid or swelling. No visible canal hematoma. Retropharyngeal course of both carotid arteries in the neck. Calcified carotid bifurcation atherosclerosis. Otherwise negative for age noncontrast CT appearance of the neck. Disc levels: Severe chronic disc and endplate degeneration V4-Q5 through C6-C7. Widespread bilateral chronic facet arthropathy, up to severe at the bilateral C2-C3 and C3-C4 levels. Mild degenerative spinal stenosis suspected at C5-C6 and C6-C7. Upper chest: Visible upper thoracic levels appear grossly intact. Negative lung apices. IMPRESSION: 1. Scalp soft tissue injury without underlying fracture. 2. No acute intracranial abnormality. Chronic small vessel disease appears stable since 2016. 3.  No acute fracture identified in the cervical spine. 4. Multilevel degenerative appearing cervical and upper thoracic spondylolisthesis with associated advanced disc, endplate, and facet degeneration. Mild spinal stenosis suspected at C5-C6 and C6-C7. Electronically Signed   By: Genevie Ann M.D.   On: 04/02/2016 08:47   Ct Cervical Spine Wo Contrast  Result Date: 04/02/2016 CLINICAL DATA:  83 year old female status post fall this  morning, struck back of head. Initial encounter. EXAM: CT HEAD WITHOUT CONTRAST CT CERVICAL SPINE WITHOUT CONTRAST TECHNIQUE: Multidetector CT imaging of the head and cervical spine was performed following the standard protocol without intravenous contrast. Multiplanar CT image reconstructions of the cervical spine were also generated. COMPARISON:  Head CT without contrast 03/16/2014 and earlier. FINDINGS: CT HEAD FINDINGS Brain: Mild generalized cerebral volume loss since 2014, volume remains within normal limits for age. Chronic lacunar infarcts in the deep gray matter including the left caudate are stable since 2016. Superimposed mild for age patchy bilateral white matter hypodensity. No midline shift, ventriculomegaly, mass effect, evidence of mass lesion, intracranial hemorrhage or evidence of cortically based acute infarction. No cortical encephalomalacia identified. Vascular: Chronic Calcified atherosclerosis at the skull base. No suspicious intracranial vascular hyperdensity. Skull: Intact.  No acute osseous abnormality identified. Sinuses/Orbits: Visualized paranasal sinuses and mastoids are stable and well pneumatized. Other: Right posterosuperior convexity scalp laceration with trace soft tissue gas. Associated broad-based scalp hematoma measuring up to 5 mm in thickness. Underlying calvarium intact. Other visible orbit and scalp soft tissues are negative. CT CERVICAL SPINE FINDINGS Alignment: Reversal of cervical lordosis with multilevel spondylolisthesis. Anterolisthesis of C3 on C4 measuring up to 3 mm. Trace anterolisthesis C4-C5. Mild retrolisthesis C5-C6 and C6-C7. Mild anterolisthesis C7-T1 measuring 1-2 mm. Similar mild anterolisthesis T1-T2. Bilateral posterior element alignment is within normal limits. Skull base and vertebrae: Visualized skull base is intact. No atlanto-occipital dissociation. Degenerative partially calcified ligamentous hypertrophy about the odontoid. No acute cervical spine  fracture. Benign appearing C4 vertebral body sclerotic focus (sagittal image 22). Soft tissues and spinal canal: No prevertebral fluid or swelling. No visible canal hematoma. Retropharyngeal course of both carotid arteries in the neck. Calcified carotid bifurcation atherosclerosis. Otherwise negative for age noncontrast CT appearance of the neck. Disc levels: Severe chronic disc and endplate degeneration Z5-G3 through C6-C7. Widespread bilateral chronic facet arthropathy, up to severe at the bilateral C2-C3 and C3-C4 levels. Mild degenerative spinal stenosis suspected at C5-C6 and C6-C7. Upper chest: Visible upper thoracic levels appear grossly intact. Negative lung apices. IMPRESSION: 1. Scalp soft tissue injury without underlying fracture. 2. No acute intracranial abnormality. Chronic small vessel disease appears stable since 2016. 3.  No acute fracture identified in the cervical spine. 4.  Multilevel degenerative appearing cervical and upper thoracic spondylolisthesis with associated advanced disc, endplate, and facet degeneration. Mild spinal stenosis suspected at C5-C6 and C6-C7. Electronically Signed   By: Genevie Ann M.D.   On: 04/02/2016 08:47   Dg Knee Complete 4 Views Right  Result Date: 04/02/2016 CLINICAL DATA:  Unwitnessed fall at nursing facility today. Knee gave out. EXAM: RIGHT KNEE - COMPLETE 4+ VIEW COMPARISON:  None. FINDINGS: Large knee joint effusion. Advanced arthropathy lateral compartment with loss of joint space, sclerosis and articular surface irregularity. Probable small intra-articular loose bodies. Medial compartment and patellofemoral joint appear unremarkable. Regional arterial calcification is noted. No acute bone finding. IMPRESSION: Advanced lateral compartment arthropathy which could be due to advanced osteoarthritis or previous AVN. Large knee joint effusion. Probable small intra-articular loose bodies. Electronically Signed   By: Nelson Chimes M.D.   On: 04/02/2016 08:47     Procedures Procedures (including critical care time)  LACERATION REPAIR Performed by: Larene Pickett Authorized by: Larene Pickett Consent: Verbal consent obtained. Risks and benefits: risks, benefits and alternatives were discussed Consent given by: patient Patient identity confirmed: provided demographic data Prepped and Draped in normal sterile fashion Wound explored  Laceration Location: posterior scalp  Laceration Length: 1cm  No Foreign Bodies seen or palpated  Anesthesia: topical  Local anesthetic: LET  Anesthetic total: 3 ml  Irrigation method: syringe Amount of cleaning: standard  Skin closure: staple  Number of staples: 1  Technique: staple  Patient tolerance: Patient tolerated the procedure well with no immediate complications.   Medications Ordered in ED Medications  bacitracin ointment (1 application Topical Given 04/02/16 1118)  carvedilol (COREG) tablet 3.125 mg (3.125 mg Oral Given 04/02/16 0804)  Tdap (BOOSTRIX) injection 0.5 mL (0.5 mLs Intramuscular Given 04/02/16 0805)  lidocaine-EPINEPHrine-tetracaine (LET) solution (3 mLs Topical Given 04/02/16 0933)  cloNIDine (CATAPRES) tablet 0.1 mg (0.1 mg Oral Given 04/02/16 1015)  hydrogen peroxide 3 % external solution ( Topical Given 04/02/16 1118)     Initial Impression / Assessment and Plan / ED Course  I have reviewed the triage vital signs and the nursing notes.  Pertinent labs & imaging results that were available during my care of the patient were reviewed by me and considered in my medical decision making (see chart for details).  83 year old female here after mechanical fall this morning. Reports her right knee "buckled" which caused her to fall. She fell backwards and struck her head on the floor. No loss of consciousness. Patient is awake, alert, oriented to her baseline here.   Sustained a small 1 cm laceration of the posterior scalp, does have associated soft tissue hemtatoma. Small  abrasion of right knee, mild swelling.  No leg shortening, pelvis stable.  CT head and c-spine negative.  Knee films with effusion but no acute fracture.  Tetanus updated.  Laceration repaired with staple.  Patient's BP elevated here-- was not given her morning meds yet at facility.  Have given meds here with some improvement.  Suspect will continue to improve over the next hour or so. Patient asymptomatic of this.  Feel patient is stable for discharge home.  Will have her follow-up with PCP for staple removal in 1 week.  Discussed plan with patient and daughter, they both acknowledged understanding and agreed with plan of care.  Return precautions given for new or worsening symptoms.  Case discussed with attending physician, Dr. Ashok Cordia, who evaluated patient and agrees with assessment and plan of care.  Final Clinical Impressions(s) /  ED Diagnoses   Final diagnoses:  Fall, initial encounter  Laceration of scalp without foreign body, initial encounter    New Prescriptions New Prescriptions   No medications on file     Larene Pickett, Hershal Coria 04/02/16 Abanda, MD 04/02/16 [phone]

## 2016-04-02 NOTE — Discharge Instructions (Signed)
Staple will need to be removed in 1 week.  Blood pressure medications for this morning were already given here. Follow-up with your primary care doctor. Return here for new concerns.

## 2016-04-02 NOTE — ED Notes (Addendum)
Discharge instructions and follow up care reviewed with patient and patient's daughter. Patient and daughter verbalized understanding.

## 2016-04-02 NOTE — ED Triage Notes (Signed)
Per EMS, patient had unwitnessed fall. Patient states she was walking and right knee gave out. She fell backwards and hit her head on floor. Denies loss of consciousness or blood thinner use. 1 inch laceration to the back of head. Denies pain. Hx of dementia. Alert and oriented x4. Patient is from [facility].

## 2016-04-03 ENCOUNTER — Encounter: Payer: Self-pay | Admitting: Nurse Practitioner

## 2016-04-03 ENCOUNTER — Non-Acute Institutional Stay (SKILLED_NURSING_FACILITY): Payer: Medicare Other | Admitting: Nurse Practitioner

## 2016-04-03 DIAGNOSIS — W19XXXS Unspecified fall, sequela: Secondary | ICD-10-CM | POA: Diagnosis not present

## 2016-04-03 DIAGNOSIS — K219 Gastro-esophageal reflux disease without esophagitis: Secondary | ICD-10-CM | POA: Diagnosis not present

## 2016-04-03 DIAGNOSIS — I10 Essential (primary) hypertension: Secondary | ICD-10-CM | POA: Diagnosis not present

## 2016-04-03 DIAGNOSIS — F411 Generalized anxiety disorder: Secondary | ICD-10-CM

## 2016-04-03 DIAGNOSIS — D631 Anemia in chronic kidney disease: Secondary | ICD-10-CM | POA: Diagnosis not present

## 2016-04-03 DIAGNOSIS — I5032 Chronic diastolic (congestive) heart failure: Secondary | ICD-10-CM

## 2016-04-03 DIAGNOSIS — N189 Chronic kidney disease, unspecified: Secondary | ICD-10-CM | POA: Diagnosis not present

## 2016-04-03 DIAGNOSIS — R609 Edema, unspecified: Secondary | ICD-10-CM | POA: Diagnosis not present

## 2016-04-03 DIAGNOSIS — R413 Other amnesia: Secondary | ICD-10-CM

## 2016-04-03 DIAGNOSIS — N183 Chronic kidney disease, stage 3 unspecified: Secondary | ICD-10-CM

## 2016-04-03 DIAGNOSIS — W19XXXA Unspecified fall, initial encounter: Secondary | ICD-10-CM | POA: Insufficient documentation

## 2016-04-03 NOTE — Assessment & Plan Note (Signed)
Compensated clinically, continue Furosemide 20mg  and Kcl 35meq

## 2016-04-03 NOTE — Assessment & Plan Note (Signed)
Mechanical fall 04/02/16 at [facility], s/p ED eval, CT head and cervical spine, X-ray R knee showed no acute process. Scalp contusion should heal.  Close supervision needed for safety.

## 2016-04-03 NOTE — Assessment & Plan Note (Signed)
Not apparent BLE, continue Furosemide 20mg  daily, Kcl 62meq,

## 2016-04-03 NOTE — Assessment & Plan Note (Signed)
Permissive blood pressure control, continue Carvedilol 3.125mg  bid, Clonidine 0.1mg  bid, Imdur 60mg , Furosemide 20mg , update UA C/S CBC CMP BNP TSH

## 2016-04-03 NOTE — Assessment & Plan Note (Signed)
Last Hgb 11.5 10/05/15

## 2016-04-03 NOTE — Assessment & Plan Note (Signed)
Will update MMSE °

## 2016-04-03 NOTE — Assessment & Plan Note (Signed)
Stable, cont Mirtazapine 7.5mg  nightly

## 2016-04-03 NOTE — Assessment & Plan Note (Signed)
Last Bun 22, creat 2.16 10/05/15

## 2016-04-03 NOTE — Progress Notes (Signed)
Location:  [place] Room Number: 5 Place of Service:  SNF (31) Provider:  Mast, Manxie  NP  Jeanmarie Hubert, MD  Patient Care Team: Estill Dooms, MD as PCP - General (Internal Medicine) Juanita Craver, MD as Consulting Physician (Gastroenterology) Shon Hough, MD as Consulting Physician (Ophthalmology) Thornell Sartorius, MD as Consulting Physician (Otolaryngology) Adrian Man Otho Darner, NP as Nurse Practitioner (Nurse Practitioner) Bo Merino, MD as Consulting Physician (Rheumatology) Suella Broad, MD as Consulting Physician (Physical Medicine and Rehabilitation) Netta Cedars, MD as Consulting Physician (Orthopedic Surgery) Fanny Skates, MD as Consulting Physician (General Surgery) Truitt Merle, MD as Consulting Physician (Hematology) Thea Silversmith, MD (Inactive) as Consulting Physician (Radiation Oncology) Rockwell Germany, RN as Registered Nurse Mauro Kaufmann, RN as Registered Nurse Sylvan Cheese, NP as Nurse Practitioner (Nurse Practitioner) [name], MD as Consulting Physician (Cardiology)  Extended Emergency Contact Information Primary Emergency Contact: [name],[name] Address: [address]          [place] [address] Johnnette Litter [place] Phone: [phone] Mobile Phone: [phone] Relation: Daughter Secondary Emergency Contact: [name],[name]  [place] Mobile Phone: [phone] Relation: None  Code Status:  DNR Goals of care: Advanced Directive information Advanced Directives 04/03/2016  Does Patient Have a Medical Advance Directive? Yes  Type of Paramedic of [place];Out of facility DNR (pink MOST or yellow form)  Does patient want to make changes to medical advance directive? No - Patient declined  Copy of [place] in Chart? Yes  Pre-existing out of facility DNR order (yellow form or pink MOST form) Yellow form placed in chart  (order not valid for inpatient use)     Chief Complaint  Patient presents with  . Acute Visit    elevated BP    HPI:  Pt is a 83 y.o. female seen today for an acute visit for f/u ED eval 04/02/16 for fall, CT head and cervical spine, showed 1. Scalp soft tissue injury without underlying fracture. 2. No acute intracranial abnormality. Chronic small vessel disease appears stable since 2016. 3.  No acute fracture identified in the cervical spine. 4. Multilevel degenerative appearing cervical and upper thoracic spondylolisthesis with associated advanced disc, endplate, and facet degeneration. Mild spinal stenosis suspected at C5-C6 and C6-C7.  X-ray R knee showed Advanced lateral compartment arthropathy which could be due to advanced osteoarthritis or previous AVN. Large knee joint effusion. Probable small intra-articular loose bodies.     Hx of CHF compensated clinically while on Furosemide 20mg , Kcl 32meq, CKD creat 2.2 10/05/15. Blood pressure is controlled on Carvedilol 3.125mg  bid, Clonidine 0.1mg  bid, Imdur 60mg  daily. Her mood is stable while on Mirtazapine 7.5mg  daily.  Past Medical History:  Diagnosis Date  . Arthritis    Osteoarthritis  . Cancer of left breast [facility]) August 2016  . Cerebral atherosclerosis 12/19/2012  . Cerebral atrophy 12/19/2012  . Cerebral ischemia 12/19/2012   Microvascular   . CKD (chronic kidney disease) stage 3, GFR 30-59 ml/min 11/03/2014  . Cough   . Dementia   . Diverticulosis of colon (without mention of hemorrhage)   . Edema   . Flat feet   . GERD (gastroesophageal reflux disease)   . Glaucoma   . Heart murmur   . Hyperlipidemia   . Hypertension   . Hypertonicity of bladder   . Ischemic cardiomyopathy 11/04/2014  . Lumbago   . Memory change 03/28/2016  . Osteoarthrosis, unspecified whether generalized or  localized, unspecified site   . Other malaise and fatigue   . Pain in joint, pelvic region and thigh   . Pain in joint, shoulder region   .  Personal history of fall   . RLQ abdominal pain 01/17/2011  . Senile osteoporosis   . Small bowel obstruction   . STEMI (ST elevation myocardial infarction) ([place]) 11/04/2014  . Unstable gait 11/04/2014  . Urine, incontinence, stress female 09/18/2014   Past Surgical History:  Procedure Laterality Date  . BREAST LUMPECTOMY Bilateral   . BREAST LUMPECTOMY WITH RADIOACTIVE SEED LOCALIZATION Left 03/15/2015   Procedure: LEFT BREAST LUMPECTOMY WITH RADIOACTIVE SEED LOCALIZATION;  Surgeon: Fanny Skates, MD;  Location: [place];  Service: General;  Laterality: Left;  . CARDIAC CATHETERIZATION N/A 11/08/2014   Procedure: Left Heart Cath and Coronary Angiography;  Surgeon: [name], MD;  Location: [place] CV LAB;  Service: Cardiovascular;  Laterality: N/A;  . COLON SURGERY  02/1996   Colectomy, partial for diverticular bleed 90%  . EYE SURGERY    . JOINT REPLACEMENT Left 12/2000   Left knee; Dr. Wynelle Link  . LAPAROSCOPIC LYSIS OF ADHESIONS  11/15/10   Exploratory  . TONSILLECTOMY      Allergies  Allergen Reactions  . Sulfa Antibiotics Itching    Allergies as of 04/03/2016      Reactions   Sulfa Antibiotics Itching      Medication List       Accurate as of 04/03/16  2:46 PM. Always use your most recent med list.          acetaminophen 325 MG tablet Commonly known as:  TYLENOL Take 650 mg by mouth 2 (two) times daily. Take two tablets by mouth every 6 hours as needed for mild pain or fever   alum & mag hydroxide-simeth 200-200-20 MG/5ML suspension Commonly known as:  MAALOX/MYLANTA Take by mouth. 34ml every 4 hours as needed for indigestion   ARTIFICIAL TEARS OP Apply 1 drop to eye 2 (two) times daily.   atorvastatin 20 MG tablet Commonly known as:  LIPITOR Take one tablet by mouth once daily for cholesterol   BETIMOL 0.5 % ophthalmic solution Generic drug:  timolol Place 1 drop into both eyes every morning.   carvedilol 3.125 MG tablet Commonly known as:   COREG Take one tablet by mouth twice daily with a meal   Coenzyme Q10 50 MG Tabs Take one tablet by mouth at bedtime   furosemide 20 MG tablet Commonly known as:  LASIX Take 20 mg by mouth daily.   isosorbide mononitrate 60 MG 24 hr tablet Commonly known as:  IMDUR Take 60 mg by mouth at bedtime.   KLOR-CON 10 10 MEQ tablet Generic drug:  potassium chloride Take 10 mEq by mouth daily.   latanoprost 0.005 % ophthalmic solution Commonly known as:  XALATAN Place 1 drop into both eyes at bedtime.   mirtazapine 7.5 MG tablet Commonly known as:  REMERON TAKE 1 TABLET (7.5 MG TOTAL) BY MOUTH AT BEDTIME. NIGHTLY FOR APPETITE   omeprazole 20 MG capsule Commonly known as:  PRILOSEC Take 20 mg by mouth. Take one tablet once daily   vitamin B-12 1000 MCG tablet Commonly known as:  CYANOCOBALAMIN Take 1 tablet by mouth twice daily On Mondays,Wednesdays and Fridays       Review of Systems  Constitutional: Negative for chills, diaphoresis and fever.  HENT: Positive for hearing loss. Negative for congestion, ear pain, postnasal drip, rhinorrhea, sinus pressure, sore throat and tinnitus.  Eyes: Negative.  Negative for discharge and redness.  Respiratory: Positive for cough and chest tightness. Negative for shortness of breath.   Cardiovascular: Negative for chest pain, palpitations and leg swelling (ankles).       Resolved edema BLE. History of systolic heart failure.  Gastrointestinal: Negative for abdominal pain, constipation, diarrhea, nausea and vomiting.       History of diverticulosis. She denies any abdominal pain.  Genitourinary: Positive for frequency. Negative for dysuria, flank pain and urgency.       Urine incontinence  Musculoskeletal: Positive for arthralgias and back pain. Negative for myalgias and neck pain.       Flat feet with dropped arches. Pin in the right medial malleolar bones. Chronic back pain. Chronic right shoulder pain. Right knee pain. Uses 4 wheel  walker.  Skin: Negative.  Negative for rash.       S/p left upper outer lumpectomy. Scalp contusion.   Neurological: Negative for dizziness, tremors and weakness (Generalized).  Hematological:       History of anemia.  Psychiatric/Behavioral: The patient is not nervous/anxious.     Immunization History  Administered Date(s) Administered  . Influenza Whole 11/19/2011, 12/02/2012  . Influenza-Unspecified 12/06/2013, 12/06/2015  . PPD Test 11/10/2014  . Pneumococcal Polysaccharide-23 02/18/2009  . Tdap 04/02/2016   Pertinent  Health Maintenance Due  Topic Date Due  . PNA vac Low Risk Adult (2 of 2 - PCV13) 02/18/2010  . MAMMOGRAM  10/13/2015  . INFLUENZA VACCINE  Completed  . DEXA SCAN  Completed   Fall Risk  07/13/2015 11/17/2014 04/21/2014 12/17/2012  Falls in the past year? No Yes Yes Yes  Number falls in past yr: - 1 1 1   Injury with Fall? - No No Yes  Risk for fall due to : - Impaired balance/gait;History of fall(s) - -   Functional Status Survey:    Vitals:   04/03/16 1217  BP: 140/70  Pulse: 74  Resp: 18  Temp: 98.3 F (36.8 C)  Weight: 132 lb 6.4 oz (60.1 kg)  Height: 5\' 4"  (1.626 m)   Body mass index is 22.73 kg/m. Physical Exam  Constitutional: She is oriented to person, place, and time. She appears well-developed and well-nourished. No distress.  HENT:  Head: Normocephalic and atraumatic.  Right Ear: External ear normal.  Left Ear: External ear normal.  Nose: Nose normal.  Mouth/Throat: Oropharynx is clear and moist. No oropharyngeal exudate.  Left nostril erythematous  Eyes: Conjunctivae and EOM are normal. Pupils are equal, round, and reactive to light. Right eye exhibits no discharge. Left eye exhibits no discharge. No scleral icterus.  Neck: Normal range of motion. Neck supple. No JVD present. No thyromegaly present.  Cardiovascular: Normal rate and regular rhythm.   Murmur heard.  Systolic murmur is present with a grade of 2/6  Systolic murmurs 4/6   Pulmonary/Chest: Effort normal. No respiratory distress. She has no wheezes. She has rales (deep rattling in chest. rhonchi.). She exhibits no tenderness.  Abdominal: Soft. Bowel sounds are normal. She exhibits no distension. There is no tenderness. There is no rebound.  Musculoskeletal: Normal range of motion. She exhibits tenderness. She exhibits no edema (1+  bipedal).  Right knee deformity with osteoarthritic changes.  Lymphadenopathy:    She has no cervical adenopathy.  Neurological: She is alert and oriented to person, place, and time. She has normal reflexes. No cranial nerve deficit. She exhibits normal muscle tone. Coordination normal.  04/21/14 MMSE 28/30. Failed clock drawing.  Skin: Skin  is warm and dry. No rash noted. She is not diaphoretic. No erythema.  The left upper outer breast s/p lumpectomy. Scalp contusion occiput.   Psychiatric: She has a normal mood and affect. Her behavior is normal. Judgment and thought content normal.    Labs reviewed:  Recent Labs  05/23/15 07/20/15 10/05/15  NA 142 142 143  K 4.1 4.1 3.6  BUN 25* 85* 22*  CREATININE 2.3* 2.2* 2.2*    Recent Labs  05/16/15 05/23/15 10/05/15  AST 12* 13 12*  ALT 5* 6* 5*  ALKPHOS 44 44 36    Recent Labs  05/23/15 07/20/15 10/05/15  WBC 4.6 5.3 3.9  HGB 11.3* 10.3* 11.5*  HCT 35* 31* 34*  PLT 192 178 151   Lab Results  Component Value Date   TSH 1.55 10/05/2015   Lab Results  Component Value Date   HGBA1C 5.6 11/06/2014   Lab Results  Component Value Date   CHOL 108 07/20/2015   HDL 45 07/20/2015   LDLCALC 39 07/20/2015   TRIG 121 07/20/2015   CHOLHDL 3.1 11/06/2014    Significant Diagnostic Results in last 30 days:  Ct Head Wo Contrast  Result Date: 04/02/2016 CLINICAL DATA:  84 year old female status post fall this morning, struck back of head. Initial encounter. EXAM: CT HEAD WITHOUT CONTRAST CT CERVICAL SPINE WITHOUT CONTRAST TECHNIQUE: Multidetector CT imaging of the head and  cervical spine was performed following the standard protocol without intravenous contrast. Multiplanar CT image reconstructions of the cervical spine were also generated. COMPARISON:  Head CT without contrast 03/16/2014 and earlier. FINDINGS: CT HEAD FINDINGS Brain: Mild generalized cerebral volume loss since 2014, volume remains within normal limits for age. Chronic lacunar infarcts in the deep gray matter including the left caudate are stable since 2016. Superimposed mild for age patchy bilateral white matter hypodensity. No midline shift, ventriculomegaly, mass effect, evidence of mass lesion, intracranial hemorrhage or evidence of cortically based acute infarction. No cortical encephalomalacia identified. Vascular: Chronic Calcified atherosclerosis at the skull base. No suspicious intracranial vascular hyperdensity. Skull: Intact.  No acute osseous abnormality identified. Sinuses/Orbits: Visualized paranasal sinuses and mastoids are stable and well pneumatized. Other: Right posterosuperior convexity scalp laceration with trace soft tissue gas. Associated broad-based scalp hematoma measuring up to 5 mm in thickness. Underlying calvarium intact. Other visible orbit and scalp soft tissues are negative. CT CERVICAL SPINE FINDINGS Alignment: Reversal of cervical lordosis with multilevel spondylolisthesis. Anterolisthesis of C3 on C4 measuring up to 3 mm. Trace anterolisthesis C4-C5. Mild retrolisthesis C5-C6 and C6-C7. Mild anterolisthesis C7-T1 measuring 1-2 mm. Similar mild anterolisthesis T1-T2. Bilateral posterior element alignment is within normal limits. Skull base and vertebrae: Visualized skull base is intact. No atlanto-occipital dissociation. Degenerative partially calcified ligamentous hypertrophy about the odontoid. No acute cervical spine fracture. Benign appearing C4 vertebral body sclerotic focus (sagittal image 22). Soft tissues and spinal canal: No prevertebral fluid or swelling. No visible canal  hematoma. Retropharyngeal course of both carotid arteries in the neck. Calcified carotid bifurcation atherosclerosis. Otherwise negative for age noncontrast CT appearance of the neck. Disc levels: Severe chronic disc and endplate degeneration Q6-P6 through C6-C7. Widespread bilateral chronic facet arthropathy, up to severe at the bilateral C2-C3 and C3-C4 levels. Mild degenerative spinal stenosis suspected at C5-C6 and C6-C7. Upper chest: Visible upper thoracic levels appear grossly intact. Negative lung apices. IMPRESSION: 1. Scalp soft tissue injury without underlying fracture. 2. No acute intracranial abnormality. Chronic small vessel disease appears stable since 2016. 3.  No acute fracture  identified in the cervical spine. 4. Multilevel degenerative appearing cervical and upper thoracic spondylolisthesis with associated advanced disc, endplate, and facet degeneration. Mild spinal stenosis suspected at C5-C6 and C6-C7. Electronically Signed   By: Genevie Ann M.D.   On: 04/02/2016 08:47   Ct Cervical Spine Wo Contrast  Result Date: 04/02/2016 CLINICAL DATA:  83 year old female status post fall this morning, struck back of head. Initial encounter. EXAM: CT HEAD WITHOUT CONTRAST CT CERVICAL SPINE WITHOUT CONTRAST TECHNIQUE: Multidetector CT imaging of the head and cervical spine was performed following the standard protocol without intravenous contrast. Multiplanar CT image reconstructions of the cervical spine were also generated. COMPARISON:  Head CT without contrast 03/16/2014 and earlier. FINDINGS: CT HEAD FINDINGS Brain: Mild generalized cerebral volume loss since 2014, volume remains within normal limits for age. Chronic lacunar infarcts in the deep gray matter including the left caudate are stable since 2016. Superimposed mild for age patchy bilateral white matter hypodensity. No midline shift, ventriculomegaly, mass effect, evidence of mass lesion, intracranial hemorrhage or evidence of cortically based  acute infarction. No cortical encephalomalacia identified. Vascular: Chronic Calcified atherosclerosis at the skull base. No suspicious intracranial vascular hyperdensity. Skull: Intact.  No acute osseous abnormality identified. Sinuses/Orbits: Visualized paranasal sinuses and mastoids are stable and well pneumatized. Other: Right posterosuperior convexity scalp laceration with trace soft tissue gas. Associated broad-based scalp hematoma measuring up to 5 mm in thickness. Underlying calvarium intact. Other visible orbit and scalp soft tissues are negative. CT CERVICAL SPINE FINDINGS Alignment: Reversal of cervical lordosis with multilevel spondylolisthesis. Anterolisthesis of C3 on C4 measuring up to 3 mm. Trace anterolisthesis C4-C5. Mild retrolisthesis C5-C6 and C6-C7. Mild anterolisthesis C7-T1 measuring 1-2 mm. Similar mild anterolisthesis T1-T2. Bilateral posterior element alignment is within normal limits. Skull base and vertebrae: Visualized skull base is intact. No atlanto-occipital dissociation. Degenerative partially calcified ligamentous hypertrophy about the odontoid. No acute cervical spine fracture. Benign appearing C4 vertebral body sclerotic focus (sagittal image 22). Soft tissues and spinal canal: No prevertebral fluid or swelling. No visible canal hematoma. Retropharyngeal course of both carotid arteries in the neck. Calcified carotid bifurcation atherosclerosis. Otherwise negative for age noncontrast CT appearance of the neck. Disc levels: Severe chronic disc and endplate degeneration C1-U3 through C6-C7. Widespread bilateral chronic facet arthropathy, up to severe at the bilateral C2-C3 and C3-C4 levels. Mild degenerative spinal stenosis suspected at C5-C6 and C6-C7. Upper chest: Visible upper thoracic levels appear grossly intact. Negative lung apices. IMPRESSION: 1. Scalp soft tissue injury without underlying fracture. 2. No acute intracranial abnormality. Chronic small vessel disease appears  stable since 2016. 3.  No acute fracture identified in the cervical spine. 4. Multilevel degenerative appearing cervical and upper thoracic spondylolisthesis with associated advanced disc, endplate, and facet degeneration. Mild spinal stenosis suspected at C5-C6 and C6-C7. Electronically Signed   By: Genevie Ann M.D.   On: 04/02/2016 08:47   Dg Knee Complete 4 Views Right  Result Date: 04/02/2016 CLINICAL DATA:  Unwitnessed fall at nursing facility today. Knee gave out. EXAM: RIGHT KNEE - COMPLETE 4+ VIEW COMPARISON:  None. FINDINGS: Large knee joint effusion. Advanced arthropathy lateral compartment with loss of joint space, sclerosis and articular surface irregularity. Probable small intra-articular loose bodies. Medial compartment and patellofemoral joint appear unremarkable. Regional arterial calcification is noted. No acute bone finding. IMPRESSION: Advanced lateral compartment arthropathy which could be due to advanced osteoarthritis or previous AVN. Large knee joint effusion. Probable small intra-articular loose bodies. Electronically Signed   By: Jan Fireman.D.  On: 04/02/2016 08:47    Assessment/Plan Hypertension Permissive blood pressure control, continue Carvedilol 3.125mg  bid, Clonidine 0.1mg  bid, Imdur 60mg , Furosemide 20mg , update UA C/S CBC CMP BNP TSH   Chronic diastolic congestive heart failure (HCC) Compensated clinically, continue Furosemide 20mg  and Kcl 36meq   GERD (gastroesophageal reflux disease) Stable, continue Omeprazole 20mg  daily.  CKD (chronic kidney disease) stage 3, GFR 30-59 ml/min Last Bun 22, creat 2.16 10/05/15  Edema Not apparent BLE, continue Furosemide 20mg  daily, Kcl 65meq,   Anemia Last Hgb 11.5 10/05/15  Right knee pain Chronic, using knee immobilizer, continue f/u Ortho, X-ray 04/02/16 ED   Anxiety state Stable, cont Mirtazapine 7.5mg  nightly  Memory change Will update MMSE  Fall Mechanical fall 04/02/16 [facility], s/p ED eval, CT head and  cervical spine, X-ray R knee showed no acute process. Scalp contusion should heal.  Close supervision needed for safety.      Family/ staff Communication: SNF  Labs/tests ordered:  UA C.S CBC CMP BNP TSH

## 2016-04-03 NOTE — Assessment & Plan Note (Signed)
Chronic, using knee immobilizer, continue f/u Ortho, X-ray 04/02/16 ED

## 2016-04-03 NOTE — Assessment & Plan Note (Signed)
Stable, continue Omeprazole 20mg daily.  

## 2016-04-04 DIAGNOSIS — N3941 Urge incontinence: Secondary | ICD-10-CM | POA: Diagnosis not present

## 2016-04-04 DIAGNOSIS — I10 Essential (primary) hypertension: Secondary | ICD-10-CM | POA: Diagnosis not present

## 2016-04-04 DIAGNOSIS — I5032 Chronic diastolic (congestive) heart failure: Secondary | ICD-10-CM | POA: Diagnosis not present

## 2016-04-04 DIAGNOSIS — R350 Frequency of micturition: Secondary | ICD-10-CM | POA: Diagnosis not present

## 2016-04-04 DIAGNOSIS — N390 Urinary tract infection, site not specified: Secondary | ICD-10-CM | POA: Diagnosis not present

## 2016-04-04 LAB — CBC AND DIFFERENTIAL
HEMATOCRIT: 32 % — AB (ref 36–46)
HEMOGLOBIN: 10.4 g/dL — AB (ref 12.0–16.0)
Platelets: 177 10*3/uL (ref 150–399)
WBC: 5.7 10*3/mL

## 2016-04-04 LAB — BASIC METABOLIC PANEL
BUN: 31 mg/dL — AB (ref 4–21)
Creatinine: 2.4 mg/dL — AB (ref <=1.1)
GLUCOSE: 93 mg/dL
POTASSIUM: 4 mmol/L (ref 3.4–5.3)
SODIUM: 144 mmol/L (ref 137–147)

## 2016-04-04 LAB — HEPATIC FUNCTION PANEL
ALT: 6 U/L — AB (ref 7–35)
AST: 12 U/L — AB (ref 13–35)
Alkaline Phosphatase: 38 U/L (ref 25–125)
Bilirubin, Total: 0.4 mg/dL

## 2016-04-04 LAB — TSH: TSH: 2.21 u[IU]/mL (ref <=5.90)

## 2016-04-05 DIAGNOSIS — M25561 Pain in right knee: Secondary | ICD-10-CM | POA: Diagnosis not present

## 2016-04-05 DIAGNOSIS — R296 Repeated falls: Secondary | ICD-10-CM | POA: Diagnosis not present

## 2016-04-05 DIAGNOSIS — N183 Chronic kidney disease, stage 3 (moderate): Secondary | ICD-10-CM | POA: Diagnosis not present

## 2016-04-05 DIAGNOSIS — I5032 Chronic diastolic (congestive) heart failure: Secondary | ICD-10-CM | POA: Diagnosis not present

## 2016-04-05 DIAGNOSIS — N393 Stress incontinence (female) (male): Secondary | ICD-10-CM | POA: Diagnosis not present

## 2016-04-05 DIAGNOSIS — R609 Edema, unspecified: Secondary | ICD-10-CM | POA: Diagnosis not present

## 2016-04-05 DIAGNOSIS — I10 Essential (primary) hypertension: Secondary | ICD-10-CM | POA: Diagnosis not present

## 2016-04-05 DIAGNOSIS — R2681 Unsteadiness on feet: Secondary | ICD-10-CM | POA: Diagnosis not present

## 2016-04-05 DIAGNOSIS — F0390 Unspecified dementia without behavioral disturbance: Secondary | ICD-10-CM | POA: Diagnosis not present

## 2016-04-05 DIAGNOSIS — M6281 Muscle weakness (generalized): Secondary | ICD-10-CM | POA: Diagnosis not present

## 2016-04-08 ENCOUNTER — Other Ambulatory Visit: Payer: Self-pay | Admitting: *Deleted

## 2016-04-08 DIAGNOSIS — R296 Repeated falls: Secondary | ICD-10-CM | POA: Diagnosis not present

## 2016-04-08 DIAGNOSIS — F0390 Unspecified dementia without behavioral disturbance: Secondary | ICD-10-CM | POA: Diagnosis not present

## 2016-04-08 DIAGNOSIS — R609 Edema, unspecified: Secondary | ICD-10-CM | POA: Diagnosis not present

## 2016-04-08 DIAGNOSIS — R2681 Unsteadiness on feet: Secondary | ICD-10-CM | POA: Diagnosis not present

## 2016-04-08 DIAGNOSIS — M6281 Muscle weakness (generalized): Secondary | ICD-10-CM | POA: Diagnosis not present

## 2016-04-08 DIAGNOSIS — N183 Chronic kidney disease, stage 3 (moderate): Secondary | ICD-10-CM | POA: Diagnosis not present

## 2016-04-08 DIAGNOSIS — M25561 Pain in right knee: Secondary | ICD-10-CM | POA: Diagnosis not present

## 2016-04-08 DIAGNOSIS — I5032 Chronic diastolic (congestive) heart failure: Secondary | ICD-10-CM | POA: Diagnosis not present

## 2016-04-08 DIAGNOSIS — N393 Stress incontinence (female) (male): Secondary | ICD-10-CM | POA: Diagnosis not present

## 2016-04-08 DIAGNOSIS — I10 Essential (primary) hypertension: Secondary | ICD-10-CM | POA: Diagnosis not present

## 2016-04-09 DIAGNOSIS — M25561 Pain in right knee: Secondary | ICD-10-CM | POA: Diagnosis not present

## 2016-04-09 DIAGNOSIS — N183 Chronic kidney disease, stage 3 (moderate): Secondary | ICD-10-CM | POA: Diagnosis not present

## 2016-04-09 DIAGNOSIS — R609 Edema, unspecified: Secondary | ICD-10-CM | POA: Diagnosis not present

## 2016-04-09 DIAGNOSIS — R2681 Unsteadiness on feet: Secondary | ICD-10-CM | POA: Diagnosis not present

## 2016-04-09 DIAGNOSIS — M6281 Muscle weakness (generalized): Secondary | ICD-10-CM | POA: Diagnosis not present

## 2016-04-09 DIAGNOSIS — I10 Essential (primary) hypertension: Secondary | ICD-10-CM | POA: Diagnosis not present

## 2016-04-09 DIAGNOSIS — I5032 Chronic diastolic (congestive) heart failure: Secondary | ICD-10-CM | POA: Diagnosis not present

## 2016-04-09 DIAGNOSIS — N393 Stress incontinence (female) (male): Secondary | ICD-10-CM | POA: Diagnosis not present

## 2016-04-09 DIAGNOSIS — F0390 Unspecified dementia without behavioral disturbance: Secondary | ICD-10-CM | POA: Diagnosis not present

## 2016-04-09 DIAGNOSIS — R296 Repeated falls: Secondary | ICD-10-CM | POA: Diagnosis not present

## 2016-04-10 DIAGNOSIS — I5032 Chronic diastolic (congestive) heart failure: Secondary | ICD-10-CM | POA: Diagnosis not present

## 2016-04-10 DIAGNOSIS — N393 Stress incontinence (female) (male): Secondary | ICD-10-CM | POA: Diagnosis not present

## 2016-04-10 DIAGNOSIS — R2681 Unsteadiness on feet: Secondary | ICD-10-CM | POA: Diagnosis not present

## 2016-04-10 DIAGNOSIS — N183 Chronic kidney disease, stage 3 (moderate): Secondary | ICD-10-CM | POA: Diagnosis not present

## 2016-04-10 DIAGNOSIS — I10 Essential (primary) hypertension: Secondary | ICD-10-CM | POA: Diagnosis not present

## 2016-04-10 DIAGNOSIS — F0390 Unspecified dementia without behavioral disturbance: Secondary | ICD-10-CM | POA: Diagnosis not present

## 2016-04-10 DIAGNOSIS — R296 Repeated falls: Secondary | ICD-10-CM | POA: Diagnosis not present

## 2016-04-10 DIAGNOSIS — R609 Edema, unspecified: Secondary | ICD-10-CM | POA: Diagnosis not present

## 2016-04-10 DIAGNOSIS — M6281 Muscle weakness (generalized): Secondary | ICD-10-CM | POA: Diagnosis not present

## 2016-04-10 DIAGNOSIS — M25561 Pain in right knee: Secondary | ICD-10-CM | POA: Diagnosis not present

## 2016-04-12 DIAGNOSIS — R2681 Unsteadiness on feet: Secondary | ICD-10-CM | POA: Diagnosis not present

## 2016-04-12 DIAGNOSIS — I10 Essential (primary) hypertension: Secondary | ICD-10-CM | POA: Diagnosis not present

## 2016-04-12 DIAGNOSIS — F0390 Unspecified dementia without behavioral disturbance: Secondary | ICD-10-CM | POA: Diagnosis not present

## 2016-04-12 DIAGNOSIS — N183 Chronic kidney disease, stage 3 (moderate): Secondary | ICD-10-CM | POA: Diagnosis not present

## 2016-04-12 DIAGNOSIS — I5032 Chronic diastolic (congestive) heart failure: Secondary | ICD-10-CM | POA: Diagnosis not present

## 2016-04-12 DIAGNOSIS — R609 Edema, unspecified: Secondary | ICD-10-CM | POA: Diagnosis not present

## 2016-04-12 DIAGNOSIS — N393 Stress incontinence (female) (male): Secondary | ICD-10-CM | POA: Diagnosis not present

## 2016-04-12 DIAGNOSIS — M25561 Pain in right knee: Secondary | ICD-10-CM | POA: Diagnosis not present

## 2016-04-12 DIAGNOSIS — M6281 Muscle weakness (generalized): Secondary | ICD-10-CM | POA: Diagnosis not present

## 2016-04-12 DIAGNOSIS — R296 Repeated falls: Secondary | ICD-10-CM | POA: Diagnosis not present

## 2016-04-16 DIAGNOSIS — R296 Repeated falls: Secondary | ICD-10-CM | POA: Diagnosis not present

## 2016-04-16 DIAGNOSIS — R2681 Unsteadiness on feet: Secondary | ICD-10-CM | POA: Diagnosis not present

## 2016-04-16 DIAGNOSIS — R609 Edema, unspecified: Secondary | ICD-10-CM | POA: Diagnosis not present

## 2016-04-16 DIAGNOSIS — N183 Chronic kidney disease, stage 3 (moderate): Secondary | ICD-10-CM | POA: Diagnosis not present

## 2016-04-16 DIAGNOSIS — M6281 Muscle weakness (generalized): Secondary | ICD-10-CM | POA: Diagnosis not present

## 2016-04-16 DIAGNOSIS — F0390 Unspecified dementia without behavioral disturbance: Secondary | ICD-10-CM | POA: Diagnosis not present

## 2016-04-16 DIAGNOSIS — I10 Essential (primary) hypertension: Secondary | ICD-10-CM | POA: Diagnosis not present

## 2016-04-16 DIAGNOSIS — N393 Stress incontinence (female) (male): Secondary | ICD-10-CM | POA: Diagnosis not present

## 2016-04-16 DIAGNOSIS — M25561 Pain in right knee: Secondary | ICD-10-CM | POA: Diagnosis not present

## 2016-04-16 DIAGNOSIS — I5032 Chronic diastolic (congestive) heart failure: Secondary | ICD-10-CM | POA: Diagnosis not present

## 2016-04-17 DIAGNOSIS — M25561 Pain in right knee: Secondary | ICD-10-CM | POA: Diagnosis not present

## 2016-04-17 DIAGNOSIS — R609 Edema, unspecified: Secondary | ICD-10-CM | POA: Diagnosis not present

## 2016-04-17 DIAGNOSIS — R296 Repeated falls: Secondary | ICD-10-CM | POA: Diagnosis not present

## 2016-04-17 DIAGNOSIS — R2681 Unsteadiness on feet: Secondary | ICD-10-CM | POA: Diagnosis not present

## 2016-04-17 DIAGNOSIS — I10 Essential (primary) hypertension: Secondary | ICD-10-CM | POA: Diagnosis not present

## 2016-04-17 DIAGNOSIS — F0390 Unspecified dementia without behavioral disturbance: Secondary | ICD-10-CM | POA: Diagnosis not present

## 2016-04-17 DIAGNOSIS — I5032 Chronic diastolic (congestive) heart failure: Secondary | ICD-10-CM | POA: Diagnosis not present

## 2016-04-17 DIAGNOSIS — N393 Stress incontinence (female) (male): Secondary | ICD-10-CM | POA: Diagnosis not present

## 2016-04-17 DIAGNOSIS — N183 Chronic kidney disease, stage 3 (moderate): Secondary | ICD-10-CM | POA: Diagnosis not present

## 2016-04-17 DIAGNOSIS — M6281 Muscle weakness (generalized): Secondary | ICD-10-CM | POA: Diagnosis not present

## 2016-04-18 ENCOUNTER — Encounter: Payer: Self-pay | Admitting: Nurse Practitioner

## 2016-04-18 ENCOUNTER — Non-Acute Institutional Stay (SKILLED_NURSING_FACILITY): Payer: Medicare Other | Admitting: Nurse Practitioner

## 2016-04-18 DIAGNOSIS — R413 Other amnesia: Secondary | ICD-10-CM

## 2016-04-18 DIAGNOSIS — N183 Chronic kidney disease, stage 3 unspecified: Secondary | ICD-10-CM

## 2016-04-18 DIAGNOSIS — I5032 Chronic diastolic (congestive) heart failure: Secondary | ICD-10-CM

## 2016-04-18 DIAGNOSIS — D631 Anemia in chronic kidney disease: Secondary | ICD-10-CM

## 2016-04-18 DIAGNOSIS — G8929 Other chronic pain: Secondary | ICD-10-CM

## 2016-04-18 DIAGNOSIS — N393 Stress incontinence (female) (male): Secondary | ICD-10-CM

## 2016-04-18 DIAGNOSIS — K219 Gastro-esophageal reflux disease without esophagitis: Secondary | ICD-10-CM | POA: Diagnosis not present

## 2016-04-18 DIAGNOSIS — M25561 Pain in right knee: Secondary | ICD-10-CM

## 2016-04-18 DIAGNOSIS — S0101XS Laceration without foreign body of scalp, sequela: Secondary | ICD-10-CM | POA: Diagnosis not present

## 2016-04-18 DIAGNOSIS — F411 Generalized anxiety disorder: Secondary | ICD-10-CM | POA: Diagnosis not present

## 2016-04-18 DIAGNOSIS — R609 Edema, unspecified: Secondary | ICD-10-CM

## 2016-04-18 DIAGNOSIS — N189 Chronic kidney disease, unspecified: Secondary | ICD-10-CM | POA: Diagnosis not present

## 2016-04-18 DIAGNOSIS — N318 Other neuromuscular dysfunction of bladder: Secondary | ICD-10-CM

## 2016-04-18 DIAGNOSIS — I10 Essential (primary) hypertension: Secondary | ICD-10-CM

## 2016-04-18 NOTE — Progress Notes (Signed)
Location:  [facility] Room Number: 5 Place of Service:  SNF (31) Provider:  Maddux First, Manxie NP  Jeanmarie Hubert, MD  Patient Care Team: Estill Dooms, MD as PCP - General (Internal Medicine) Juanita Craver, MD as Consulting Physician (Gastroenterology) Shon Hough, MD as Consulting Physician (Ophthalmology) Thornell Sartorius, MD as Consulting Physician (Otolaryngology) [place] Erleen Egner Otho Darner, NP as Nurse Practitioner (Nurse Practitioner) Bo Merino, MD as Consulting Physician (Rheumatology) Suella Broad, MD as Consulting Physician (Physical Medicine and Rehabilitation) Netta Cedars, MD as Consulting Physician (Orthopedic Surgery) Fanny Skates, MD as Consulting Physician (General Surgery) Truitt Merle, MD as Consulting Physician (Hematology) Thea Silversmith, MD (Inactive) as Consulting Physician (Radiation Oncology) Rockwell Germany, RN as Registered Nurse Mauro Kaufmann, RN as Registered Nurse Sylvan Cheese, NP as Nurse Practitioner (Nurse Practitioner) [name], MD as Consulting Physician (Cardiology)  Extended Emergency Contact Information Primary Emergency Contact: [name],[name] Address: [address]          [place] [address] Johnnette Litter of [place] Phone: [phone] Mobile Phone: [phone] Relation: Daughter Secondary Emergency Contact: [name],[name]  [place] Mobile Phone: [phone] Relation: None  Code Status:  DNR Goals of care: Advanced Directive information Advanced Directives 04/18/2016  Does Patient Have a Medical Advance Directive? Yes  Type of Paramedic of [place];Out of facility DNR (pink MOST or yellow form)  Does patient want to make changes to medical advance directive? No - Patient declined  Copy of [place] in Chart? Yes  Pre-existing out of facility DNR order (yellow form or pink MOST form) Yellow form placed in chart  (order not valid for inpatient use)     Chief Complaint  Patient presents with  . Acute Visit    staple removal    HPI:  Pt is a 83 y.o. female seen today for an acute visit for f/u occipital contusion, healed, staple removed.                 Hx of CHF compensated clinically while on Furosemide 20mg , Kcl 51meq, urinary frequency, managed with Myrbetriq, CKD creat 2.4 04/04/16 Blood pressure is controlled on Carvedilol 3.125mg  bid, Clonidine 0.1mg  bid, Imdur 60mg  daily. Her mood is stable while on Mirtazapine 7.5mg  daily. X-ray R knee showed Advanced lateral compartment arthropathy which could be due to advanced osteoarthritis or previous AVN. Large knee joint effusion. Probable small intra-articular loose bodies.taking Tylenol 650mg  bid for knee pain.     Past Medical History:  Diagnosis Date  . Arthritis    Osteoarthritis  . Cancer of left breast [facility]) August 2016  . Cerebral atherosclerosis 12/19/2012  . Cerebral atrophy 12/19/2012  . Cerebral ischemia 12/19/2012   Microvascular   . CKD (chronic kidney disease) stage 3, GFR 30-59 ml/min 11/03/2014  . Cough   . Dementia   . Diverticulosis of colon (without mention of hemorrhage)   . Edema   . Flat feet   . GERD (gastroesophageal reflux disease)   . Glaucoma   . Heart murmur   . Hyperlipidemia   . Hypertension   . Hypertonicity of bladder   . Ischemic cardiomyopathy 11/04/2014  . Lumbago   . Memory change 03/28/2016  . Osteoarthrosis, unspecified whether generalized or localized, unspecified site   . Other malaise and fatigue   . Pain in joint, pelvic region and thigh   . Pain in joint, shoulder region   . Personal history of fall   .  RLQ abdominal pain 01/17/2011  . Senile osteoporosis   . Small bowel obstruction   . STEMI (ST elevation myocardial infarction) ([place]) 11/04/2014  . Unstable gait 11/04/2014  . Urine, incontinence, stress female 09/18/2014   Past Surgical History:  Procedure Laterality Date  . BREAST  LUMPECTOMY Bilateral   . BREAST LUMPECTOMY WITH RADIOACTIVE SEED LOCALIZATION Left 03/15/2015   Procedure: LEFT BREAST LUMPECTOMY WITH RADIOACTIVE SEED LOCALIZATION;  Surgeon: Fanny Skates, MD;  Location: [facility];  Service: General;  Laterality: Left;  . CARDIAC CATHETERIZATION N/A 11/08/2014   Procedure: Left Heart Cath and Coronary Angiography;  Surgeon: [name], MD;  Location: [facility];  Service: Cardiovascular;  Laterality: N/A;  . COLON SURGERY  02/1996   Colectomy, partial for diverticular bleed 90%  . EYE SURGERY    . JOINT REPLACEMENT Left 12/2000   Left knee; Dr. Wynelle Link  . LAPAROSCOPIC LYSIS OF ADHESIONS  11/15/10   Exploratory  . TONSILLECTOMY      Allergies  Allergen Reactions  . Sulfa Antibiotics Itching    Allergies as of 04/18/2016      Reactions   Sulfa Antibiotics Itching      Medication List       Accurate as of 04/18/16 11:59 PM. Always use your most recent med list.          acetaminophen 325 MG tablet Commonly known as:  TYLENOL Take 650 mg by mouth 2 (two) times daily. Take two tablets by mouth every 6 hours as needed for mild pain or fever   alum & mag hydroxide-simeth 200-200-20 MG/5ML suspension Commonly known as:  MAALOX/MYLANTA Take by mouth. 91ml every 4 hours as needed for indigestion   ARTIFICIAL TEARS OP Apply 1 drop to eye 2 (two) times daily.   atorvastatin 20 MG tablet Commonly known as:  LIPITOR Take one tablet by mouth once daily for cholesterol   BETIMOL 0.5 % ophthalmic solution Generic drug:  timolol Place 1 drop into both eyes every morning.   carvedilol 3.125 MG tablet Commonly known as:  COREG Take one tablet by mouth twice daily with a meal   cloNIDine 0.1 MG tablet Commonly known as:  CATAPRES Take 0.1 mg by mouth 2 (two) times daily.   Coenzyme Q10 50 MG Tabs Take one tablet by mouth at bedtime   furosemide 20 MG tablet Commonly known as:  LASIX Take 20 mg by mouth daily.   isosorbide mononitrate  60 MG 24 hr tablet Commonly known as:  IMDUR Take 60 mg by mouth at bedtime.   KLOR-CON 10 10 MEQ tablet Generic drug:  potassium chloride Take 10 mEq by mouth daily.   latanoprost 0.005 % ophthalmic solution Commonly known as:  XALATAN Place 1 drop into both eyes at bedtime.   mirtazapine 7.5 MG tablet Commonly known as:  REMERON TAKE 1 TABLET (7.5 MG TOTAL) BY MOUTH AT BEDTIME. NIGHTLY FOR APPETITE   MYRBETRIQ 50 MG Tb24 tablet Generic drug:  mirabegron ER Take 50 mg by mouth daily.   omeprazole 20 MG capsule Commonly known as:  PRILOSEC Take 20 mg by mouth. Take one tablet once daily   vitamin B-12 1000 MCG tablet Commonly known as:  CYANOCOBALAMIN Take 1 tablet by mouth twice daily On Mondays,Wednesdays and Fridays       Review of Systems  Constitutional: Negative for chills, diaphoresis and fever.  HENT: Positive for hearing loss. Negative for congestion, ear pain, postnasal drip, rhinorrhea, sinus pressure, sore throat and tinnitus.  Eyes: Negative.  Negative for discharge and redness.  Respiratory: Positive for cough and chest tightness. Negative for shortness of breath.   Cardiovascular: Negative for chest pain, palpitations and leg swelling (ankles).       Resolved edema BLE. History of systolic heart failure.  Gastrointestinal: Negative for abdominal pain, constipation, diarrhea, nausea and vomiting.       History of diverticulosis. She denies any abdominal pain.  Genitourinary: Positive for frequency. Negative for dysuria, flank pain and urgency.       Urine incontinence  Musculoskeletal: Positive for arthralgias and back pain. Negative for myalgias and neck pain.       Flat feet with dropped arches. Pin in the right medial malleolar bones. Chronic back pain. Chronic right shoulder pain. Right knee pain. Uses 4 wheel walker.  Skin: Negative.  Negative for rash.       S/p left upper outer lumpectomy. Scalp contusion.   Neurological: Negative for dizziness,  tremors and weakness (Generalized).  Hematological:       History of anemia.  Psychiatric/Behavioral: The patient is not nervous/anxious.     Immunization History  Administered Date(s) Administered  . Influenza Whole 11/19/2011, 12/02/2012  . Influenza-Unspecified 12/06/2013, 12/06/2015  . PPD Test 11/10/2014  . Pneumococcal Polysaccharide-23 02/18/2009  . Tdap 04/02/2016   Pertinent  Health Maintenance Due  Topic Date Due  . PNA vac Low Risk Adult (2 of 2 - PCV13) 02/18/2010  . MAMMOGRAM  10/13/2015  . INFLUENZA VACCINE  Completed  . DEXA SCAN  Completed   Fall Risk  07/13/2015 11/17/2014 04/21/2014 12/17/2012  Falls in the past year? No Yes Yes Yes  Number falls in past yr: - 1 1 1   Injury with Fall? - No No Yes  Risk for fall due to : - Impaired balance/gait;History of fall(s) - -   Functional Status Survey:    Vitals:   04/18/16 1513  BP: 120/70  Pulse: 78  Resp: 19  Temp: 97.5 F (36.4 C)  SpO2: 96%  Weight: 133 lb 3.2 oz (60.4 kg)  Height: 5\' 4"  (1.626 m)   Body mass index is 22.86 kg/m. Physical Exam  Constitutional: She is oriented to person, place, and time. She appears well-developed and well-nourished. No distress.  HENT:  Head: Normocephalic and atraumatic.  Right Ear: External ear normal.  Left Ear: External ear normal.  Nose: Nose normal.  Mouth/Throat: Oropharynx is clear and moist. No oropharyngeal exudate.  Left nostril erythematous  Eyes: Conjunctivae and EOM are normal. Pupils are equal, round, and reactive to light. Right eye exhibits no discharge. Left eye exhibits no discharge. No scleral icterus.  Neck: Normal range of motion. Neck supple. No JVD present. No thyromegaly present.  Cardiovascular: Normal rate and regular rhythm.   Murmur heard.  Systolic murmur is present with a grade of 2/6  Systolic murmurs 4/6  Pulmonary/Chest: Effort normal. No respiratory distress. She has no wheezes. She has rales (deep rattling in chest. rhonchi.). She  exhibits no tenderness.  Abdominal: Soft. Bowel sounds are normal. She exhibits no distension. There is no tenderness. There is no rebound.  Musculoskeletal: Normal range of motion. She exhibits tenderness. She exhibits no edema (1+  bipedal).  Right knee deformity with osteoarthritic changes.  Lymphadenopathy:    She has no cervical adenopathy.  Neurological: She is alert and oriented to person, place, and time. She has normal reflexes. No cranial nerve deficit. She exhibits normal muscle tone. Coordination normal.  04/21/14 MMSE 28/30. Failed clock drawing.  Skin: Skin is warm and dry. No rash noted. She is not diaphoretic. No erythema.  The left upper outer breast s/p lumpectomy. Scalp contusion occiput-healed, staple removed.   Psychiatric: She has a normal mood and affect. Her behavior is normal. Judgment and thought content normal.    Labs reviewed:  Recent Labs  07/20/15 10/05/15 04/04/16  NA 142 143 144  K 4.1 3.6 4.0  BUN 85* 22* 31*  CREATININE 2.2* 2.2* 2.4*    Recent Labs  05/23/15 10/05/15 04/04/16  AST 13 12* 12*  ALT 6* 5* 6*  ALKPHOS 44 36 38    Recent Labs  07/20/15 10/05/15 04/04/16  WBC 5.3 3.9 5.7  HGB 10.3* 11.5* 10.4*  HCT 31* 34* 32*  PLT 178 151 177   Lab Results  Component Value Date   TSH 2.21 04/04/2016   Lab Results  Component Value Date   HGBA1C 5.6 11/06/2014   Lab Results  Component Value Date   CHOL 108 07/20/2015   HDL 45 07/20/2015   LDLCALC 39 07/20/2015   TRIG 121 07/20/2015   CHOLHDL 3.1 11/06/2014    Significant Diagnostic Results in last 30 days:  Ct Head Wo Contrast  Result Date: 04/02/2016 CLINICAL DATA:  83 year old female status post fall this morning, struck back of head. Initial encounter. EXAM: CT HEAD WITHOUT CONTRAST CT CERVICAL SPINE WITHOUT CONTRAST TECHNIQUE: Multidetector CT imaging of the head and cervical spine was performed following the standard protocol without intravenous contrast. Multiplanar CT image  reconstructions of the cervical spine were also generated. COMPARISON:  Head CT without contrast 03/16/2014 and earlier. FINDINGS: CT HEAD FINDINGS Brain: Mild generalized cerebral volume loss since 2014, volume remains within normal limits for age. Chronic lacunar infarcts in the deep gray matter including the left caudate are stable since 2016. Superimposed mild for age patchy bilateral white matter hypodensity. No midline shift, ventriculomegaly, mass effect, evidence of mass lesion, intracranial hemorrhage or evidence of cortically based acute infarction. No cortical encephalomalacia identified. Vascular: Chronic Calcified atherosclerosis at the skull base. No suspicious intracranial vascular hyperdensity. Skull: Intact.  No acute osseous abnormality identified. Sinuses/Orbits: Visualized paranasal sinuses and mastoids are stable and well pneumatized. Other: Right posterosuperior convexity scalp laceration with trace soft tissue gas. Associated broad-based scalp hematoma measuring up to 5 mm in thickness. Underlying calvarium intact. Other visible orbit and scalp soft tissues are negative. CT CERVICAL SPINE FINDINGS Alignment: Reversal of cervical lordosis with multilevel spondylolisthesis. Anterolisthesis of C3 on C4 measuring up to 3 mm. Trace anterolisthesis C4-C5. Mild retrolisthesis C5-C6 and C6-C7. Mild anterolisthesis C7-T1 measuring 1-2 mm. Similar mild anterolisthesis T1-T2. Bilateral posterior element alignment is within normal limits. Skull base and vertebrae: Visualized skull base is intact. No atlanto-occipital dissociation. Degenerative partially calcified ligamentous hypertrophy about the odontoid. No acute cervical spine fracture. Benign appearing C4 vertebral body sclerotic focus (sagittal image 22). Soft tissues and spinal canal: No prevertebral fluid or swelling. No visible canal hematoma. Retropharyngeal course of both carotid arteries in the neck. Calcified carotid bifurcation  atherosclerosis. Otherwise negative for age noncontrast CT appearance of the neck. Disc levels: Severe chronic disc and endplate degeneration Y3-K1 through C6-C7. Widespread bilateral chronic facet arthropathy, up to severe at the bilateral C2-C3 and C3-C4 levels. Mild degenerative spinal stenosis suspected at C5-C6 and C6-C7. Upper chest: Visible upper thoracic levels appear grossly intact. Negative lung apices. IMPRESSION: 1. Scalp soft tissue injury without underlying fracture. 2. No acute intracranial abnormality. Chronic small vessel disease appears stable since 2016. 3.  No acute fracture identified in the cervical spine. 4. Multilevel degenerative appearing cervical and upper thoracic spondylolisthesis with associated advanced disc, endplate, and facet degeneration. Mild spinal stenosis suspected at C5-C6 and C6-C7. Electronically Signed   By: Genevie Ann M.D.   On: 04/02/2016 08:47   Ct Cervical Spine Wo Contrast  Result Date: 04/02/2016 CLINICAL DATA:  83 year old female status post fall this morning, struck back of head. Initial encounter. EXAM: CT HEAD WITHOUT CONTRAST CT CERVICAL SPINE WITHOUT CONTRAST TECHNIQUE: Multidetector CT imaging of the head and cervical spine was performed following the standard protocol without intravenous contrast. Multiplanar CT image reconstructions of the cervical spine were also generated. COMPARISON:  Head CT without contrast 03/16/2014 and earlier. FINDINGS: CT HEAD FINDINGS Brain: Mild generalized cerebral volume loss since 2014, volume remains within normal limits for age. Chronic lacunar infarcts in the deep gray matter including the left caudate are stable since 2016. Superimposed mild for age patchy bilateral white matter hypodensity. No midline shift, ventriculomegaly, mass effect, evidence of mass lesion, intracranial hemorrhage or evidence of cortically based acute infarction. No cortical encephalomalacia identified. Vascular: Chronic Calcified atherosclerosis at  the skull base. No suspicious intracranial vascular hyperdensity. Skull: Intact.  No acute osseous abnormality identified. Sinuses/Orbits: Visualized paranasal sinuses and mastoids are stable and well pneumatized. Other: Right posterosuperior convexity scalp laceration with trace soft tissue gas. Associated broad-based scalp hematoma measuring up to 5 mm in thickness. Underlying calvarium intact. Other visible orbit and scalp soft tissues are negative. CT CERVICAL SPINE FINDINGS Alignment: Reversal of cervical lordosis with multilevel spondylolisthesis. Anterolisthesis of C3 on C4 measuring up to 3 mm. Trace anterolisthesis C4-C5. Mild retrolisthesis C5-C6 and C6-C7. Mild anterolisthesis C7-T1 measuring 1-2 mm. Similar mild anterolisthesis T1-T2. Bilateral posterior element alignment is within normal limits. Skull base and vertebrae: Visualized skull base is intact. No atlanto-occipital dissociation. Degenerative partially calcified ligamentous hypertrophy about the odontoid. No acute cervical spine fracture. Benign appearing C4 vertebral body sclerotic focus (sagittal image 22). Soft tissues and spinal canal: No prevertebral fluid or swelling. No visible canal hematoma. Retropharyngeal course of both carotid arteries in the neck. Calcified carotid bifurcation atherosclerosis. Otherwise negative for age noncontrast CT appearance of the neck. Disc levels: Severe chronic disc and endplate degeneration H3-Z1 through C6-C7. Widespread bilateral chronic facet arthropathy, up to severe at the bilateral C2-C3 and C3-C4 levels. Mild degenerative spinal stenosis suspected at C5-C6 and C6-C7. Upper chest: Visible upper thoracic levels appear grossly intact. Negative lung apices. IMPRESSION: 1. Scalp soft tissue injury without underlying fracture. 2. No acute intracranial abnormality. Chronic small vessel disease appears stable since 2016. 3.  No acute fracture identified in the cervical spine. 4. Multilevel degenerative  appearing cervical and upper thoracic spondylolisthesis with associated advanced disc, endplate, and facet degeneration. Mild spinal stenosis suspected at C5-C6 and C6-C7. Electronically Signed   By: Genevie Ann M.D.   On: 04/02/2016 08:47   Dg Knee Complete 4 Views Right  Result Date: 04/02/2016 CLINICAL DATA:  Unwitnessed fall at nursing facility today. Knee gave out. EXAM: RIGHT KNEE - COMPLETE 4+ VIEW COMPARISON:  None. FINDINGS: Large knee joint effusion. Advanced arthropathy lateral compartment with loss of joint space, sclerosis and articular surface irregularity. Probable small intra-articular loose bodies. Medial compartment and patellofemoral joint appear unremarkable. Regional arterial calcification is noted. No acute bone finding. IMPRESSION: Advanced lateral compartment arthropathy which could be due to advanced osteoarthritis or previous AVN. Large knee joint effusion. Probable small intra-articular loose bodies. Electronically Signed   By: Elta [place]  Shogry M.D.   On: 04/02/2016 08:47    Assessment/Plan Scalp laceration, sequela Occipital laceration, healed, staple removed.   Hypertension Permissive blood pressure control, continue Carvedilol 3.125mg  bid, Clonidine 0.1mg  bid, Imdur 60mg , Furosemide 20mg , 04/04/16 wbc 5.7, Hgb 10.4, plt 177, Na 144, K 4.0, Bun 31, creat 2.44, TSH 2.21, BNP 123.2   Anemia 04/04/16 wbc 5.7, Hgb 10.4, plt 177, Na 144, K 4.0, Bun 31, creat 2.44, TSH 2.21, BNP 123.2, continue Vit B12 daily.    Urine, incontinence, stress female 04/04/16 Myrbetriq per urology   Chronic diastolic congestive heart failure ([place]) Compensated clinically, continue Furosemide 20mg  and Kcl 82meq, 04/04/16 BNP 123.2  GERD (gastroesophageal reflux disease) Stable, continue Omeprazole 20mg  daily.  CKD (chronic kidney disease) stage 3, GFR 30-59 ml/min Creat 2s  Hypertonicity of bladder 04/04/16 Myrbetriq per urology  Edema Not apparent BLE, continue Furosemide 20mg  daily, Kcl  42meq,   Right knee pain X-ray R knee showed Advanced lateral compartment arthropathy which could be due to advanced osteoarthritis or previous AVN. Large knee joint effusion. Probable small intra-articular loose bodies.taking Tylenol 650mg  bid for knee pain. Continue the R knee immobilizer.     Anxiety state Stable, cont Mirtazapine 7.5mg  nightly   Memory change Memory lapses, continue SNF for care assistance.      Family/ staff Communication: SNF  Labs/tests ordered:  none

## 2016-04-21 DIAGNOSIS — S0101XS Laceration without foreign body of scalp, sequela: Secondary | ICD-10-CM | POA: Insufficient documentation

## 2016-04-21 NOTE — Assessment & Plan Note (Signed)
04/04/16 wbc 5.7, Hgb 10.4, plt 177, Na 144, K 4.0, Bun 31, creat 2.44, TSH 2.21, BNP 123.2, continue Vit B12 daily.

## 2016-04-21 NOTE — Assessment & Plan Note (Signed)
Stable, continue Omeprazole 20mg daily.  

## 2016-04-21 NOTE — Assessment & Plan Note (Signed)
04/04/16 Myrbetriq per urology

## 2016-04-21 NOTE — Assessment & Plan Note (Signed)
Memory lapses, continue SNF for care assistance.

## 2016-04-21 NOTE — Assessment & Plan Note (Addendum)
Permissive blood pressure control, continue Carvedilol 3.125mg  bid, Clonidine 0.1mg  bid, Imdur 60mg , Furosemide 20mg , 04/04/16 wbc 5.7, Hgb 10.4, plt 177, Na 144, K 4.0, Bun 31, creat 2.44, TSH 2.21, BNP 123.2

## 2016-04-21 NOTE — Assessment & Plan Note (Signed)
Creat 2s

## 2016-04-21 NOTE — Assessment & Plan Note (Signed)
Occipital laceration, healed, staple removed.

## 2016-04-21 NOTE — Assessment & Plan Note (Signed)
Compensated clinically, continue Furosemide 20mg  and Kcl 75meq, 04/04/16 BNP 123.2

## 2016-04-21 NOTE — Assessment & Plan Note (Signed)
Stable, cont Mirtazapine 7.5mg  nightly

## 2016-04-21 NOTE — Assessment & Plan Note (Signed)
X-ray R knee showed Advanced lateral compartment arthropathy which could be due to advanced osteoarthritis or previous AVN. Large knee joint effusion. Probable small intra-articular loose bodies.taking Tylenol 650mg  bid for knee pain. Continue the R knee immobilizer.

## 2016-04-21 NOTE — Assessment & Plan Note (Signed)
Not apparent BLE, continue Furosemide 20mg  daily, Kcl 7meq,

## 2016-05-02 ENCOUNTER — Encounter: Payer: Self-pay | Admitting: Internal Medicine

## 2016-05-02 DIAGNOSIS — M1711 Unilateral primary osteoarthritis, right knee: Secondary | ICD-10-CM | POA: Diagnosis not present

## 2016-05-06 ENCOUNTER — Non-Acute Institutional Stay (SKILLED_NURSING_FACILITY): Payer: Medicare Other | Admitting: Nurse Practitioner

## 2016-05-06 ENCOUNTER — Encounter: Payer: Self-pay | Admitting: *Deleted

## 2016-05-06 DIAGNOSIS — K219 Gastro-esophageal reflux disease without esophagitis: Secondary | ICD-10-CM | POA: Diagnosis not present

## 2016-05-06 DIAGNOSIS — F411 Generalized anxiety disorder: Secondary | ICD-10-CM | POA: Diagnosis not present

## 2016-05-06 DIAGNOSIS — N183 Chronic kidney disease, stage 3 unspecified: Secondary | ICD-10-CM

## 2016-05-06 DIAGNOSIS — I63239 Cerebral infarction due to unspecified occlusion or stenosis of unspecified carotid arteries: Secondary | ICD-10-CM | POA: Diagnosis not present

## 2016-05-06 DIAGNOSIS — N189 Chronic kidney disease, unspecified: Secondary | ICD-10-CM | POA: Diagnosis not present

## 2016-05-06 DIAGNOSIS — R609 Edema, unspecified: Secondary | ICD-10-CM | POA: Diagnosis not present

## 2016-05-06 DIAGNOSIS — R413 Other amnesia: Secondary | ICD-10-CM | POA: Diagnosis not present

## 2016-05-06 DIAGNOSIS — I5032 Chronic diastolic (congestive) heart failure: Secondary | ICD-10-CM | POA: Diagnosis not present

## 2016-05-06 DIAGNOSIS — G8929 Other chronic pain: Secondary | ICD-10-CM

## 2016-05-06 DIAGNOSIS — I10 Essential (primary) hypertension: Secondary | ICD-10-CM

## 2016-05-06 DIAGNOSIS — M5440 Lumbago with sciatica, unspecified side: Secondary | ICD-10-CM

## 2016-05-06 DIAGNOSIS — N318 Other neuromuscular dysfunction of bladder: Secondary | ICD-10-CM

## 2016-05-06 DIAGNOSIS — M25561 Pain in right knee: Secondary | ICD-10-CM | POA: Diagnosis not present

## 2016-05-06 DIAGNOSIS — D631 Anemia in chronic kidney disease: Secondary | ICD-10-CM

## 2016-05-06 NOTE — Assessment & Plan Note (Addendum)
Not apparent BLE, continue Furosemide 20mg  daily, Kcl 31meq, 04/04/16 wbc 5.7, Hgb 10.4, plt 177, Na 144, K 4.0, Bun 31, creat 2.44, TSH 2.21, BNP 123.2

## 2016-05-06 NOTE — Progress Notes (Signed)
Location:  [facility] Room Number: 5 Place of Service:  SNF (31) Provider:  Yailene Badia, Manxie  NP  Jeanmarie Hubert, MD  Patient Care Team: Estill Dooms, MD as PCP - General (Internal Medicine) Juanita Craver, MD as Consulting Physician (Gastroenterology) Shon Hough, MD as Consulting Physician (Ophthalmology) Thornell Sartorius, MD as Consulting Physician (Otolaryngology) [place] Shrihaan Porzio Otho Darner, NP as Nurse Practitioner (Nurse Practitioner) Bo Merino, MD as Consulting Physician (Rheumatology) Suella Broad, MD as Consulting Physician (Physical Medicine and Rehabilitation) Netta Cedars, MD as Consulting Physician (Orthopedic Surgery) Fanny Skates, MD as Consulting Physician (General Surgery) Truitt Merle, MD as Consulting Physician (Hematology) Thea Silversmith, MD (Inactive) as Consulting Physician (Radiation Oncology) Rockwell Germany, RN as Registered Nurse Mauro Kaufmann, RN as Registered Nurse Sylvan Cheese, NP as Nurse Practitioner (Nurse Practitioner) [name], MD as Consulting Physician (Cardiology)  Extended Emergency Contact Information Primary Emergency Contact: [name],[name] Address: [address]          [place] [address] Johnnette Litter [place] Phone: [phone] Mobile Phone: [phone] Relation: Daughter Secondary Emergency Contact: [name],[name]  [place] Mobile Phone: [phone] Relation: None  Code Status:  DNR Goals of care: Advanced Directive information Advanced Directives 05/06/2016  Does Patient Have a Medical Advance Directive? Yes  Type of Paramedic of [place];Out of facility DNR (pink MOST or yellow form)  Does patient want to make changes to medical advance directive? No - Patient declined  Copy of [place] in Chart? Yes  Pre-existing out of facility DNR order (yellow form or pink MOST form) Yellow form placed in chart  (order not valid for inpatient use)     Chief Complaint  Patient presents with  . Acute Visit    Elevated BP     HPI:  Pt is a 83 y.o. female seen today for an acute visit for elevated BP, associated with dizziness, chest pressure, and fatigue, resolved after Bp normalized after am Bp meds.     Hx of CHF compensated clinically while on Furosemide 20mg , Kcl 50meq, urinary frequency, managed with Myrbetriq, CKD creat 2.4 04/04/16 Blood pressure is not controlled on Carvedilol 3.125mg  bid, Clonidine 0.1mg  bid, Imdur 60mg  daily. Her mood is stable while on Mirtazapine 7.5mg  daily. X-ray R knee showed Advanced lateral compartment arthropathy which could be due to advanced osteoarthritis or previous AVN. Large knee joint effusion. Probable small intra-articular loose bodies.taking Tylenol 650mg  bid for knee pain.    Past Medical History:  Diagnosis Date  . Arthritis    Osteoarthritis  . Cancer of left breast [facility]) August 2016  . Cerebral atherosclerosis 12/19/2012  . Cerebral atrophy 12/19/2012  . Cerebral ischemia 12/19/2012   Microvascular   . CKD (chronic kidney disease) stage 3, GFR 30-59 ml/min 11/03/2014  . Cough   . Dementia   . Diverticulosis of colon (without mention of hemorrhage)   . Edema   . Flat feet   . GERD (gastroesophageal reflux disease)   . Glaucoma   . Heart murmur   . Hyperlipidemia   . Hypertension   . Hypertonicity of bladder   . Ischemic cardiomyopathy 11/04/2014  . Lumbago   . Memory change 03/28/2016  . Osteoarthrosis, unspecified whether generalized or localized, unspecified site   . Other malaise and fatigue   . Pain in joint, pelvic region and thigh   . Pain in joint, shoulder region   . Personal history of fall   .  RLQ abdominal pain 01/17/2011  . Senile osteoporosis   . Small bowel obstruction   . STEMI (ST elevation myocardial infarction) ([facility] [place]) 11/04/2014  . Unstable gait 11/04/2014  . Urine, incontinence, stress female 09/18/2014   Past  Surgical History:  Procedure Laterality Date  . BREAST LUMPECTOMY Bilateral   . BREAST LUMPECTOMY WITH RADIOACTIVE SEED LOCALIZATION Left 03/15/2015   Procedure: LEFT BREAST LUMPECTOMY WITH RADIOACTIVE SEED LOCALIZATION;  Surgeon: Fanny Skates, MD;  Location: [facility];  Service: General;  Laterality: Left;  . CARDIAC CATHETERIZATION N/A 11/08/2014   Procedure: Left Heart Cath and Coronary Angiography;  Surgeon: [name], MD;  Location: [facility];  Service: Cardiovascular;  Laterality: N/A;  . COLON SURGERY  02/1996   Colectomy, partial for diverticular bleed 90%  . EYE SURGERY    . JOINT REPLACEMENT Left 12/2000   Left knee; Dr. Wynelle Link  . LAPAROSCOPIC LYSIS OF ADHESIONS  11/15/10   Exploratory  . TONSILLECTOMY      Allergies  Allergen Reactions  . Sulfa Antibiotics Itching    Allergies as of 05/06/2016      Reactions   Sulfa Antibiotics Itching      Medication List       Accurate as of 05/06/16  4:46 PM. Always use your most recent med list.          acetaminophen 325 MG tablet Commonly known as:  TYLENOL Take 650 mg by mouth 2 (two) times daily. Take two tablets by mouth every 6 hours as needed for mild pain or fever   alum & mag hydroxide-simeth 200-200-20 MG/5ML suspension Commonly known as:  MAALOX/MYLANTA Take by mouth. 56ml every 4 hours as needed for indigestion   ARTIFICIAL TEARS OP Apply 1 drop to eye 2 (two) times daily.   atorvastatin 20 MG tablet Commonly known as:  LIPITOR Take one tablet by mouth once daily for cholesterol   BETIMOL 0.5 % ophthalmic solution Generic drug:  timolol Place 1 drop into both eyes every morning.   carvedilol 3.125 MG tablet Commonly known as:  COREG Take one tablet by mouth twice daily with a meal   cloNIDine 0.1 MG tablet Commonly known as:  CATAPRES Take 0.1 mg by mouth 2 (two) times daily.   Coenzyme Q10 50 MG Tabs Take one tablet by mouth at bedtime   furosemide 20 MG tablet Commonly known as:   LASIX Take 20 mg by mouth daily.   isosorbide mononitrate 60 MG 24 hr tablet Commonly known as:  IMDUR Take 60 mg by mouth at bedtime.   KLOR-CON 10 10 MEQ tablet Generic drug:  potassium chloride Take 10 mEq by mouth daily.   latanoprost 0.005 % ophthalmic solution Commonly known as:  XALATAN Place 1 drop into both eyes at bedtime.   mirtazapine 7.5 MG tablet Commonly known as:  REMERON TAKE 1 TABLET (7.5 MG TOTAL) BY MOUTH AT BEDTIME. NIGHTLY FOR APPETITE   MYRBETRIQ 50 MG Tb24 tablet Generic drug:  mirabegron ER Take 50 mg by mouth daily.   omeprazole 20 MG capsule Commonly known as:  PRILOSEC Take 20 mg by mouth. Take one tablet once daily   vitamin B-12 1000 MCG tablet Commonly known as:  CYANOCOBALAMIN Take 1 tablet by mouth twice daily On Mondays,Wednesdays and Fridays       Review of Systems  Constitutional: Negative for chills, diaphoresis and fever.  HENT: Positive for hearing loss. Negative for congestion, ear pain, postnasal drip, rhinorrhea, sinus pressure, sore throat and tinnitus.  Eyes: Negative.  Negative for discharge and redness.  Respiratory: Positive for cough and chest tightness. Negative for shortness of breath.   Cardiovascular: Negative for chest pain, palpitations and leg swelling (ankles).       Resolved edema BLE. History of systolic heart failure.  Gastrointestinal: Negative for abdominal pain, constipation, diarrhea, nausea and vomiting.       History of diverticulosis. She denies any abdominal pain.  Genitourinary: Positive for frequency. Negative for dysuria, flank pain and urgency.       Urine incontinence  Musculoskeletal: Positive for arthralgias and back pain. Negative for myalgias and neck pain.       Flat feet with dropped arches. Pin in the right medial malleolar bones. Chronic back pain. Chronic right shoulder pain. Right knee pain. Uses 4 wheel walker.  Skin: Negative.  Negative for rash.       S/p left upper outer  lumpectomy.   Neurological: Negative for dizziness, tremors and weakness (Generalized).  Hematological:       History of anemia.  Psychiatric/Behavioral: The patient is not nervous/anxious.     Immunization History  Administered Date(s) Administered  . Influenza Whole 11/19/2011, 12/02/2012  . Influenza-Unspecified 12/06/2013, 12/06/2015  . PPD Test 11/10/2014  . Pneumococcal Polysaccharide-23 02/18/2009  . Tdap 04/02/2016   Pertinent  Health Maintenance Due  Topic Date Due  . PNA vac Low Risk Adult (2 of 2 - PCV13) 02/18/2010  . MAMMOGRAM  10/13/2015  . INFLUENZA VACCINE  Completed  . DEXA SCAN  Completed   Fall Risk  07/13/2015 11/17/2014 04/21/2014 12/17/2012  Falls in the past year? No Yes Yes Yes  Number falls in past yr: - 1 1 1   Injury with Fall? - No No Yes  Risk for fall due to : - Impaired balance/gait;History of fall(s) - -   Functional Status Survey:    Vitals:   05/06/16 1528  BP: 138/82  Pulse: 70  Resp: 18  Temp: 98.3 F (36.8 C)  Weight: 132 lb 6.4 oz (60.1 kg)  Height: 5\' 4"  (1.626 m)   Body mass index is 22.73 kg/m. Physical Exam  Constitutional: She is oriented to person, place, and time. She appears well-developed and well-nourished. No distress.  HENT:  Head: Normocephalic and atraumatic.  Right Ear: External ear normal.  Left Ear: External ear normal.  Nose: Nose normal.  Mouth/Throat: Oropharynx is clear and moist. No oropharyngeal exudate.  Left nostril erythematous  Eyes: Conjunctivae and EOM are normal. Pupils are equal, round, and reactive to light. Right eye exhibits no discharge. Left eye exhibits no discharge. No scleral icterus.  Neck: Normal range of motion. Neck supple. No JVD present. No thyromegaly present.  Cardiovascular: Normal rate and regular rhythm.   Murmur heard.  Systolic murmur is present with a grade of 2/6  Systolic murmurs 4/6  Pulmonary/Chest: Effort normal. No respiratory distress. She has no wheezes. She has  rales (deep rattling in chest. rhonchi.). She exhibits no tenderness.  Abdominal: Soft. Bowel sounds are normal. She exhibits no distension. There is no tenderness. There is no rebound.  Musculoskeletal: Normal range of motion. She exhibits tenderness. She exhibits no edema (1+  bipedal).  Right knee deformity with osteoarthritic changes.  Lymphadenopathy:    She has no cervical adenopathy.  Neurological: She is alert and oriented to person, place, and time. She has normal reflexes. No cranial nerve deficit. She exhibits normal muscle tone. Coordination normal.  04/21/14 MMSE 28/30. Failed clock drawing.  Skin: Skin is warm  and dry. No rash noted. She is not diaphoretic. No erythema.  The left upper outer breast s/p lumpectomy.   Psychiatric: She has a normal mood and affect. Her behavior is normal. Judgment and thought content normal.    Labs reviewed:  Recent Labs  07/20/15 10/05/15 04/04/16  NA 142 143 144  K 4.1 3.6 4.0  BUN 85* 22* 31*  CREATININE 2.2* 2.2* 2.4*    Recent Labs  05/23/15 10/05/15 04/04/16  AST 13 12* 12*  ALT 6* 5* 6*  ALKPHOS 44 36 38    Recent Labs  07/20/15 10/05/15 04/04/16  WBC 5.3 3.9 5.7  HGB 10.3* 11.5* 10.4*  HCT 31* 34* 32*  PLT 178 151 177   Lab Results  Component Value Date   TSH 2.21 04/04/2016   Lab Results  Component Value Date   HGBA1C 5.6 11/06/2014   Lab Results  Component Value Date   CHOL 108 07/20/2015   HDL 45 07/20/2015   LDLCALC 39 07/20/2015   TRIG 121 07/20/2015   CHOLHDL 3.1 11/06/2014    Significant Diagnostic Results in last 30 days:  No results found.  Assessment/Plan Hypertension Permissive blood pressure control, increase Carvedilol 6.25mg  bid, continue Clonidine 0.1mg  bid, Imdur 60mg , Furosemide 20mg , 04/04/16 wbc 5.7, Hgb 10.4, plt 177, Na 144, K 4.0, Bun 31, creat 2.44, TSH 2.21, BNP 123.2   Chronic diastolic congestive heart failure (HCC) Compensated clinically, continue Furosemide 20mg  and Kcl 10meq,  04/04/16 BNP 123.2  CVA (cerebral vascular accident) No focal weakness.   GERD (gastroesophageal reflux disease) Stable, continue Omeprazole 20mg  daily.  CKD (chronic kidney disease) stage 3, GFR 30-59 ml/min Creat 2.44 04/04/16  Hypertonicity of bladder 04/04/16 Myrbetriq per urology  Edema Not apparent BLE, continue Furosemide 20mg  daily, Kcl 27meq, 04/04/16 wbc 5.7, Hgb 10.4, plt 177, Na 144, K 4.0, Bun 31, creat 2.44, TSH 2.21, BNP 123.2    Anemia Hgb 10.4 04/04/16  Right knee pain X-ray R knee showed Advanced lateral compartment arthropathy which could be due to advanced osteoarthritis or previous AVN. Large knee joint effusion. Probable small intra-articular loose bodies.taking Tylenol 650mg  bid for knee pain. Continue the R knee immobilizer.     Lumbago Still able to ambulate with walker. Continue Tylenol 650mg  bid.   Anxiety state Stable, cont Mirtazapine 7.5mg  nightly   Memory change Memory lapses, continue SNF for care assistance.      Family/ staff Communication: SNF  Labs/tests ordered:  none

## 2016-05-06 NOTE — Assessment & Plan Note (Signed)
X-ray R knee showed Advanced lateral compartment arthropathy which could be due to advanced osteoarthritis or previous AVN. Large knee joint effusion. Probable small intra-articular loose bodies.taking Tylenol 650mg  bid for knee pain. Continue the R knee immobilizer.

## 2016-05-06 NOTE — Assessment & Plan Note (Signed)
04/04/16 Myrbetriq per urology

## 2016-05-06 NOTE — Assessment & Plan Note (Signed)
Hgb 10.4 04/04/16

## 2016-05-06 NOTE — Assessment & Plan Note (Signed)
Still able to ambulate with walker. Continue Tylenol 650mg  bid.

## 2016-05-06 NOTE — Assessment & Plan Note (Signed)
Permissive blood pressure control, increase Carvedilol 6.25mg  bid, continue Clonidine 0.1mg  bid, Imdur 60mg , Furosemide 20mg , 04/04/16 wbc 5.7, Hgb 10.4, plt 177, Na 144, K 4.0, Bun 31, creat 2.44, TSH 2.21, BNP 123.2

## 2016-05-06 NOTE — Assessment & Plan Note (Signed)
Compensated clinically, continue Furosemide 20mg  and Kcl 74meq, 04/04/16 BNP 123.2

## 2016-05-06 NOTE — Assessment & Plan Note (Signed)
Stable, cont Mirtazapine 7.5mg  nightly

## 2016-05-06 NOTE — Assessment & Plan Note (Signed)
Memory lapses, continue SNF for care assistance.

## 2016-05-06 NOTE — Assessment & Plan Note (Signed)
Stable, continue Omeprazole 20mg daily.  

## 2016-05-06 NOTE — Assessment & Plan Note (Signed)
No focal weakness.

## 2016-05-06 NOTE — Assessment & Plan Note (Signed)
Creat 2.44 04/04/16

## 2016-05-28 DIAGNOSIS — E785 Hyperlipidemia, unspecified: Secondary | ICD-10-CM | POA: Diagnosis not present

## 2016-05-28 DIAGNOSIS — N183 Chronic kidney disease, stage 3 (moderate): Secondary | ICD-10-CM | POA: Diagnosis not present

## 2016-05-28 DIAGNOSIS — I10 Essential (primary) hypertension: Secondary | ICD-10-CM | POA: Diagnosis not present

## 2016-05-28 LAB — LIPID PANEL
CHOLESTEROL: 126 mg/dL (ref 0–200)
HDL: 44 mg/dL (ref 35–70)
LDL Cholesterol: 63 mg/dL
LDl/HDL Ratio: 2.9
TRIGLYCERIDES: 102 mg/dL (ref 40–160)

## 2016-05-30 ENCOUNTER — Other Ambulatory Visit: Payer: Self-pay | Admitting: *Deleted

## 2016-06-05 ENCOUNTER — Non-Acute Institutional Stay (SKILLED_NURSING_FACILITY): Payer: Medicare Other | Admitting: Nurse Practitioner

## 2016-06-05 ENCOUNTER — Encounter: Payer: Self-pay | Admitting: Nurse Practitioner

## 2016-06-05 DIAGNOSIS — D631 Anemia in chronic kidney disease: Secondary | ICD-10-CM

## 2016-06-05 DIAGNOSIS — N318 Other neuromuscular dysfunction of bladder: Secondary | ICD-10-CM

## 2016-06-05 DIAGNOSIS — R609 Edema, unspecified: Secondary | ICD-10-CM

## 2016-06-05 DIAGNOSIS — I10 Essential (primary) hypertension: Secondary | ICD-10-CM | POA: Diagnosis not present

## 2016-06-05 DIAGNOSIS — N189 Chronic kidney disease, unspecified: Secondary | ICD-10-CM

## 2016-06-05 DIAGNOSIS — N183 Chronic kidney disease, stage 3 unspecified: Secondary | ICD-10-CM

## 2016-06-05 DIAGNOSIS — F411 Generalized anxiety disorder: Secondary | ICD-10-CM | POA: Diagnosis not present

## 2016-06-05 DIAGNOSIS — R413 Other amnesia: Secondary | ICD-10-CM

## 2016-06-05 DIAGNOSIS — I5032 Chronic diastolic (congestive) heart failure: Secondary | ICD-10-CM

## 2016-06-05 DIAGNOSIS — K219 Gastro-esophageal reflux disease without esophagitis: Secondary | ICD-10-CM

## 2016-06-05 NOTE — Assessment & Plan Note (Signed)
Stable, continue Omeprazole 20mg daily.  

## 2016-06-05 NOTE — Assessment & Plan Note (Signed)
Compensated clinically, continue Furosemide 20mg  and Kcl 27meq, 04/04/16 BNP 123.2

## 2016-06-05 NOTE — Assessment & Plan Note (Signed)
Creat 2.44 04/04/16

## 2016-06-05 NOTE — Assessment & Plan Note (Signed)
Stable, cont Mirtazapine 7.5mg  nightly

## 2016-06-05 NOTE — Assessment & Plan Note (Signed)
Memory lapses, continue SNF for care assistance.

## 2016-06-05 NOTE — Progress Notes (Signed)
Location:  [facility] Room Number: 5 Place of Service:  SNF (31) Provider:  Jaquavis Felmlee, Manxie  NP  Jeanmarie Hubert, MD  Patient Care Team: Estill Dooms, MD as PCP - General (Internal Medicine) Juanita Craver, MD as Consulting Physician (Gastroenterology) Shon Hough, MD as Consulting Physician (Ophthalmology) Thornell Sartorius, MD as Consulting Physician (Otolaryngology) Abbott Aydia Maj Otho Darner, NP as Nurse Practitioner (Nurse Practitioner) Bo Merino, MD as Consulting Physician (Rheumatology) Suella Broad, MD as Consulting Physician (Physical Medicine and Rehabilitation) Netta Cedars, MD as Consulting Physician (Orthopedic Surgery) Fanny Skates, MD as Consulting Physician (General Surgery) Truitt Merle, MD as Consulting Physician (Hematology) Thea Silversmith, MD (Inactive) as Consulting Physician (Radiation Oncology) Rockwell Germany, RN as Registered Nurse Mauro Kaufmann, RN as Registered Nurse Sylvan Cheese, NP as Nurse Practitioner (Nurse Practitioner) [name], MD as Consulting Physician (Cardiology)  Extended Emergency Contact Information Primary Emergency Contact: [name],[name] Address: [address]           [address] [place] of [place] Phone: [phone] Mobile Phone: [phone] Relation: Daughter Secondary Emergency Contact: [name],[name]  [place] Mobile Phone: [phone] Relation: None  Code Status:  DNR Goals of care: Advanced Directive information Advanced Directives 06/05/2016  Does Patient Have a Medical Advance Directive? Yes  Type of Paramedic of [place];Out of facility DNR (pink MOST or yellow form)  Does patient want to make changes to medical advance directive? No - Patient declined  Copy of [place] in Chart? Yes  Pre-existing out of facility DNR order (yellow form or pink MOST form) Yellow form placed in chart  (order not valid for inpatient use)     Chief Complaint  Patient presents with  . Medical Management of Chronic Issues    HPI:  Pt is a 83 y.o. female seen today for medical management of chronic diseases.      Hx of CHF compensated clinically while on Furosemide 20mg , Kcl 39meq, urinary frequency, managed with Myrbetriq, CKD creat 2.4 2/15/18Blood pressure is not controlled on Carvedilol 3.125mg  bid, Clonidine 0.1mg  bid, Imdur 60mg  daily. Her mood is stable while on Mirtazapine 7.5mg  daily. X-ray R knee showed Advanced lateral compartment arthropathy which could be due to advanced osteoarthritis or previous AVN. Large knee joint effusion. Probable small intra-articular loose bodies.taking Tylenol 650mg  bid for knee pain.   Past Medical History:  Diagnosis Date  . Arthritis    Osteoarthritis  . Cancer of left breast [facility]) August 2016  . Cerebral atherosclerosis 12/19/2012  . Cerebral atrophy 12/19/2012  . Cerebral ischemia 12/19/2012   Microvascular   . CKD (chronic kidney disease) stage 3, GFR 30-59 ml/min 11/03/2014  . Cough   . Dementia   . Diverticulosis of colon (without mention of hemorrhage)   . Edema   . Flat feet   . GERD (gastroesophageal reflux disease)   . Glaucoma   . Heart murmur   . Hyperlipidemia   . Hypertension   . Hypertonicity of bladder   . Ischemic cardiomyopathy 11/04/2014  . Lumbago   . Memory change 03/28/2016  . Osteoarthrosis, unspecified whether generalized or localized, unspecified site   . Other malaise and fatigue   . Pain in joint, pelvic region and thigh   . Pain in joint, shoulder region   . Personal history of fall   . RLQ abdominal pain 01/17/2011  . Senile osteoporosis   . Small bowel obstruction ([place])   . STEMI (  ST elevation myocardial infarction) ([place]) 11/04/2014  . Unstable gait 11/04/2014  . Urine, incontinence, stress female 09/18/2014   Past Surgical History:  Procedure Laterality Date  . BREAST LUMPECTOMY Bilateral   .  BREAST LUMPECTOMY WITH RADIOACTIVE SEED LOCALIZATION Left 03/15/2015   Procedure: LEFT BREAST LUMPECTOMY WITH RADIOACTIVE SEED LOCALIZATION;  Surgeon: Fanny Skates, MD;  Location: [facility];  Service: General;  Laterality: Left;  . CARDIAC CATHETERIZATION N/A 11/08/2014   Procedure: Left Heart Cath and Coronary Angiography;  Surgeon: [name], MD;  Location: [facility] CV LAB;  Service: Cardiovascular;  Laterality: N/A;  . COLON SURGERY  02/1996   Colectomy, partial for diverticular bleed 90%  . EYE SURGERY    . JOINT REPLACEMENT Left 12/2000   Left knee; Dr. Wynelle Link  . LAPAROSCOPIC LYSIS OF ADHESIONS  11/15/10   Exploratory  . TONSILLECTOMY      Allergies  Allergen Reactions  . Sulfa Antibiotics Itching    Outpatient Encounter Prescriptions as of 06/05/2016  Medication Sig  . acetaminophen (TYLENOL) 325 MG tablet Take 650 mg by mouth 2 (two) times daily. Take two tablets by mouth every 6 hours as needed for mild pain or fever  . alum & mag hydroxide-simeth (MAALOX/MYLANTA) 200-200-20 MG/5ML suspension Take by mouth. 22ml every 4 hours as needed for indigestion  . atorvastatin (LIPITOR) 20 MG tablet Take one tablet by mouth once daily for cholesterol  . BETIMOL 0.5 % ophthalmic solution Place 1 drop into both eyes every morning.   . carvedilol (COREG) 3.125 MG tablet Take one tablet by mouth twice daily with a meal  . cloNIDine (CATAPRES) 0.1 MG tablet Take 0.1 mg by mouth 2 (two) times daily.  . Coenzyme Q10 50 MG TABS Take one tablet by mouth at bedtime  . famotidine (PEPCID) 20 MG tablet Take 20 mg by mouth daily.  . furosemide (LASIX) 20 MG tablet Take 20 mg by mouth daily.  . Hypromellose (ARTIFICIAL TEARS OP) Apply 1 drop to eye 2 (two) times daily.   . isosorbide mononitrate (IMDUR) 60 MG 24 hr tablet Take 60 mg by mouth at bedtime.   Marland Kitchen KLOR-CON 10 10 MEQ tablet Take 10 mEq by mouth daily.  Marland Kitchen latanoprost (XALATAN) 0.005 % ophthalmic solution Place 1 drop into both eyes at  bedtime.   . mirabegron ER (MYRBETRIQ) 50 MG TB24 tablet Take 50 mg by mouth daily.  . mirtazapine (REMERON) 7.5 MG tablet TAKE 1 TABLET (7.5 MG TOTAL) BY MOUTH AT BEDTIME. NIGHTLY FOR APPETITE  . vitamin B-12 (CYANOCOBALAMIN) 1000 MCG tablet Take 1 tablet by mouth twice daily On Mondays,Wednesdays and Fridays  . [DISCONTINUED] omeprazole (PRILOSEC) 20 MG capsule Take 20 mg by mouth. Take one tablet once daily   No facility-administered encounter medications on file as of 06/05/2016.     Review of Systems  Constitutional: Negative for chills, diaphoresis and fever.  HENT: Positive for hearing loss. Negative for congestion, ear pain, postnasal drip, rhinorrhea, sinus pressure, sore throat and tinnitus.   Eyes: Negative.  Negative for discharge and redness.  Respiratory: Positive for cough and chest tightness. Negative for shortness of breath.   Cardiovascular: Negative for chest pain, palpitations and leg swelling (ankles).       Resolved edema BLE. History of systolic heart failure.  Gastrointestinal: Negative for abdominal pain, constipation, diarrhea, nausea and vomiting.       History of diverticulosis. She denies any abdominal pain.  Genitourinary: Positive for frequency. Negative for dysuria, flank pain and urgency.  Urine incontinence  Musculoskeletal: Positive for arthralgias and back pain. Negative for myalgias and neck pain.       Flat feet with dropped arches. Pin in the right medial malleolar bones. Chronic back pain. Chronic right shoulder pain. Right knee pain. Uses 4 wheel walker.  Skin: Negative.  Negative for rash.       S/p left upper outer lumpectomy.   Neurological: Negative for dizziness, tremors and weakness (Generalized).  Hematological:       History of anemia.  Psychiatric/Behavioral: The patient is not nervous/anxious.     Immunization History  Administered Date(s) Administered  . Influenza Whole 11/19/2011, 12/02/2012  . Influenza-Unspecified  12/06/2013, 12/06/2015  . PPD Test 11/10/2014  . Pneumococcal Polysaccharide-23 02/18/2009  . Tdap 04/02/2016   Pertinent  Health Maintenance Due  Topic Date Due  . PNA vac Low Risk Adult (2 of 2 - PCV13) 02/18/2010  . MAMMOGRAM  10/13/2015  . INFLUENZA VACCINE  09/18/2016  . DEXA SCAN  Completed   Fall Risk  07/13/2015 11/17/2014 04/21/2014 12/17/2012  Falls in the past year? No Yes Yes Yes  Number falls in past yr: - 1 1 1   Injury with Fall? - No No Yes  Risk for fall due to : - Impaired balance/gait;History of fall(s) - -   Functional Status Survey:    Vitals:   06/05/16 1344  BP: 138/78  Pulse: 74  Resp: 18  Temp: 98.1 F (36.7 C)  Weight: 131 lb 1.6 oz (59.5 kg)  Height: 5\' 4"  (1.626 m)   Body mass index is 22.5 kg/m. Physical Exam  Constitutional: She is oriented to person, place, and time. She appears well-developed and well-nourished. No distress.  HENT:  Head: Normocephalic and atraumatic.  Right Ear: External ear normal.  Left Ear: External ear normal.  Nose: Nose normal.  Mouth/Throat: Oropharynx is clear and moist. No oropharyngeal exudate.  Left nostril erythematous  Eyes: Conjunctivae and EOM are normal. Pupils are equal, round, and reactive to light. Right eye exhibits no discharge. Left eye exhibits no discharge. No scleral icterus.  Neck: Normal range of motion. Neck supple. No JVD present. No thyromegaly present.  Cardiovascular: Normal rate and regular rhythm.   Murmur heard.  Systolic murmur is present with a grade of 2/6  Systolic murmurs 4/6  Pulmonary/Chest: Effort normal. No respiratory distress. She has no wheezes. She has rales (deep rattling in chest. rhonchi.). She exhibits no tenderness.  Abdominal: Soft. Bowel sounds are normal. She exhibits no distension. There is no tenderness. There is no rebound.  Musculoskeletal: Normal range of motion. She exhibits tenderness. She exhibits no edema (1+  bipedal).  Right knee deformity with  osteoarthritic changes.  Lymphadenopathy:    She has no cervical adenopathy.  Neurological: She is alert and oriented to person, place, and time. She has normal reflexes. No cranial nerve deficit. She exhibits normal muscle tone. Coordination normal.  04/21/14 MMSE 28/30. Failed clock drawing.  Skin: Skin is warm and dry. No rash noted. She is not diaphoretic. No erythema.  The left upper outer breast s/p lumpectomy.   Psychiatric: She has a normal mood and affect. Her behavior is normal. Judgment and thought content normal.    Labs reviewed:  Recent Labs  07/20/15 10/05/15 04/04/16  NA 142 143 144  K 4.1 3.6 4.0  BUN 85* 22* 31*  CREATININE 2.2* 2.2* 2.4*    Recent Labs  10/05/15 04/04/16  AST 12* 12*  ALT 5* 6*  ALKPHOS 36 38  Recent Labs  07/20/15 10/05/15 04/04/16  WBC 5.3 3.9 5.7  HGB 10.3* 11.5* 10.4*  HCT 31* 34* 32*  PLT 178 151 177   Lab Results  Component Value Date   TSH 2.21 04/04/2016   Lab Results  Component Value Date   HGBA1C 5.6 11/06/2014   Lab Results  Component Value Date   CHOL 126 05/28/2016   HDL 44 05/28/2016   LDLCALC 63 05/28/2016   TRIG 102 05/28/2016   CHOLHDL 3.1 11/06/2014    Significant Diagnostic Results in last 30 days:  No results found.  Assessment/Plan Hypertension Permissive blood pressure control, continue Carvedilol 6.25mg  bid, Clonidine 0.1mg  bid, Imdur 60mg , Furosemide 20mg , 04/04/16 wbc 5.7, Hgb 10.4, plt 177, Na 144, K 4.0, Bun 31, creat 2.44, TSH 2.21, BNP 123.2   Chronic diastolic congestive heart failure (HCC) Compensated clinically, continue Furosemide 20mg  and Kcl 71meq, 04/04/16 BNP 123.2  GERD (gastroesophageal reflux disease) Stable, continue Omeprazole 20mg  daily.  CKD (chronic kidney disease) stage 3, GFR 30-59 ml/min Creat 2.44 04/04/16  Hypertonicity of bladder 04/04/16 Myrbetriq per urology  Edema Not apparent BLE, continue Furosemide 20mg  daily, Kcl 87meq, 04/04/16 wbc 5.7, Hgb 10.4, plt 177,  Na 144, K 4.0, Bun 31, creat 2.44, TSH 2.21, BNP 123.2    Anemia Hgb 10.4 04/04/16  Anxiety state Stable, cont Mirtazapine 7.5mg  nightly   Memory change Memory lapses, continue SNF for care assistance.      Family/ staff Communication: SNF  Labs/tests ordered:  none

## 2016-06-05 NOTE — Assessment & Plan Note (Signed)
Hgb 10.4 04/04/16

## 2016-06-05 NOTE — Assessment & Plan Note (Signed)
04/04/16 Myrbetriq per urology

## 2016-06-05 NOTE — Assessment & Plan Note (Signed)
Permissive blood pressure control, continue Carvedilol 6.25mg  bid, Clonidine 0.1mg  bid, Imdur 60mg , Furosemide 20mg , 04/04/16 wbc 5.7, Hgb 10.4, plt 177, Na 144, K 4.0, Bun 31, creat 2.44, TSH 2.21, BNP 123.2

## 2016-06-05 NOTE — Assessment & Plan Note (Signed)
Not apparent BLE, continue Furosemide 20mg  daily, Kcl 10meq, 04/04/16 wbc 5.7, Hgb 10.4, plt 177, Na 144, K 4.0, Bun 31, creat 2.44, TSH 2.21, BNP 123.2

## 2016-06-11 DIAGNOSIS — N390 Urinary tract infection, site not specified: Secondary | ICD-10-CM | POA: Diagnosis not present

## 2016-06-24 ENCOUNTER — Encounter: Payer: Self-pay | Admitting: Internal Medicine

## 2016-06-24 ENCOUNTER — Non-Acute Institutional Stay (SKILLED_NURSING_FACILITY): Payer: Medicare Other | Admitting: Internal Medicine

## 2016-06-24 DIAGNOSIS — E785 Hyperlipidemia, unspecified: Secondary | ICD-10-CM | POA: Diagnosis not present

## 2016-06-24 DIAGNOSIS — R609 Edema, unspecified: Secondary | ICD-10-CM

## 2016-06-24 DIAGNOSIS — I5032 Chronic diastolic (congestive) heart failure: Secondary | ICD-10-CM

## 2016-06-24 DIAGNOSIS — N183 Chronic kidney disease, stage 3 unspecified: Secondary | ICD-10-CM

## 2016-06-24 DIAGNOSIS — I10 Essential (primary) hypertension: Secondary | ICD-10-CM | POA: Diagnosis not present

## 2016-06-24 DIAGNOSIS — N189 Chronic kidney disease, unspecified: Secondary | ICD-10-CM | POA: Diagnosis not present

## 2016-06-24 DIAGNOSIS — D631 Anemia in chronic kidney disease: Secondary | ICD-10-CM

## 2016-06-24 NOTE — Progress Notes (Signed)
Progress Note    Location:  [facility] Room Number: [place] of Service:  SNF [phone]) Provider:  Estill Dooms, MD  Patient Care Team: Estill Dooms, MD as PCP - General (Internal Medicine) Juanita Craver, MD as Consulting Physician (Gastroenterology) Shon Hough, MD as Consulting Physician (Ophthalmology) Thornell Sartorius, MD as Consulting Physician (Otolaryngology) [place], Man X, NP as Nurse Practitioner (Nurse Practitioner) Bo Merino, MD as Consulting Physician (Rheumatology) Suella Broad, MD as Consulting Physician (Physical Medicine and Rehabilitation) Netta Cedars, MD as Consulting Physician (Orthopedic Surgery) Fanny Skates, MD as Consulting Physician (General Surgery) Truitt Merle, MD as Consulting Physician (Hematology) Thea Silversmith, MD (Inactive) as Consulting Physician (Radiation Oncology) Rockwell Germany, RN as Registered Nurse Mauro Kaufmann, RN as Registered Nurse Jake Shark, Johny Blamer, NP as Nurse Practitioner (Nurse Practitioner) [name], MD as Consulting Physician (Cardiology)  Extended Emergency Contact Information Primary Emergency Contact: [name],[name] Address: [address]          [place] [address] [place] of [place] Phone: [phone] Mobile Phone: [phone] Relation: Daughter Secondary Emergency Contact: [name],[name]  [place] Mobile Phone: [phone] Relation: None  Code Status:  DNR Goals of care: Advanced Directive information Advanced Directives 06/24/2016  Does Patient Have a Medical Advance Directive? Yes  Type of Paramedic of [place];Out of facility DNR (pink MOST or yellow form)  Does patient want to make changes to medical advance directive? -  Copy of [place] in Chart? Yes  Pre-existing out of facility DNR order (yellow form or pink MOST form) Yellow form placed in  chart (order not valid for inpatient use);Pink MOST form placed in chart (order not valid for inpatient use)     Chief Complaint  Patient presents with  . Medical Management of Chronic Issues    routine visit    HPI:  Pt is a 83 y.o. female seen today for medical management of chronic diseases.    Anemia in chronic kidney disease, unspecified CKD stage - stable  Chronic diastolic congestive heart failure ([place]) - compensated. Mild dyspnea.  CKD (chronic kidney disease) stage 3, GFR 30-59 ml/min - improved BUN and stable creatinine.  Essential hypertension - controllede  Hyperlipidemia, unspecified hyperlipidemia type - controlled  Edema, unspecified type - improved     Past Medical History:  Diagnosis Date  . Arthritis    Osteoarthritis  . Cancer of left breast [facility]) August 2016  . Cerebral atherosclerosis 12/19/2012  . Cerebral atrophy 12/19/2012  . Cerebral ischemia 12/19/2012   Microvascular   . CKD (chronic kidney disease) stage 3, GFR 30-59 ml/min 11/03/2014  . Cough   . Dementia   . Diverticulosis of colon (without mention of hemorrhage)   . Edema   . Flat feet   . GERD (gastroesophageal reflux disease)   . Glaucoma   . Heart murmur   . Hyperlipidemia   . Hypertension   . Hypertonicity of bladder   . Ischemic cardiomyopathy 11/04/2014  . Lumbago   . Memory change 03/28/2016  . Osteoarthrosis, unspecified whether generalized or localized, unspecified site   . Other malaise and fatigue   . Pain in joint, pelvic region and thigh   . Pain in joint, shoulder region   . Personal history of fall   . RLQ abdominal pain 01/17/2011  . Senile osteoporosis   . Small bowel obstruction ([place])   . STEMI (ST elevation myocardial infarction) ([place]) 11/04/2014  .  Unstable gait 11/04/2014  . Urine, incontinence, stress female 09/18/2014   Past Surgical History:  Procedure Laterality Date  . BREAST LUMPECTOMY Bilateral   . BREAST LUMPECTOMY WITH RADIOACTIVE SEED  LOCALIZATION Left 03/15/2015   Procedure: LEFT BREAST LUMPECTOMY WITH RADIOACTIVE SEED LOCALIZATION;  Surgeon: Fanny Skates, MD;  Location: [facility];  Service: General;  Laterality: Left;  . CARDIAC CATHETERIZATION N/A 11/08/2014   Procedure: Left Heart Cath and Coronary Angiography;  Surgeon: [name], MD;  Location: [facility] CV LAB;  Service: Cardiovascular;  Laterality: N/A;  . COLON SURGERY  02/1996   Colectomy, partial for diverticular bleed 90%  . EYE SURGERY    . JOINT REPLACEMENT Left 12/2000   Left knee; Dr. Wynelle Link  . LAPAROSCOPIC LYSIS OF ADHESIONS  11/15/10   Exploratory  . TONSILLECTOMY      Allergies  Allergen Reactions  . Sulfa Antibiotics Itching    Outpatient Encounter Prescriptions as of 06/24/2016  Medication Sig  . acetaminophen (TYLENOL) 325 MG tablet Take 650 mg by mouth 2 (two) times daily. Take 2 tablets every 6 hours as needed for mild pain or fever  . alum & mag hydroxide-simeth (MAALOX/MYLANTA) 200-200-20 MG/5ML suspension Take by mouth. 55ml every 4 hours as needed for indigestion  . atorvastatin (LIPITOR) 20 MG tablet Take one tablet by mouth once daily for cholesterol  . BETIMOL 0.5 % ophthalmic solution Place 1 drop into both eyes every morning.   . carvedilol (COREG) 6.25 MG tablet Take 6.25 mg by mouth. Take one tablet twice daily  . cloNIDine (CATAPRES) 0.1 MG tablet Take 0.1 mg by mouth 2 (two) times daily.  . Coenzyme Q10 50 MG TABS Take one tablet by mouth at bedtime  . famotidine (PEPCID) 20 MG tablet Take 20 mg by mouth daily.  . furosemide (LASIX) 20 MG tablet Take 20 mg by mouth daily.  . Hypromellose (ARTIFICIAL TEARS OP) Apply 2 drops to eye 2 (two) times daily. Both eyes  . isosorbide mononitrate (IMDUR) 60 MG 24 hr tablet Take 60 mg by mouth at bedtime.   Marland Kitchen KLOR-CON 10 10 MEQ tablet Take 10 mEq by mouth daily.  Marland Kitchen latanoprost (XALATAN) 0.005 % ophthalmic solution Place 1 drop into both eyes at bedtime.   . mirabegron ER (MYRBETRIQ) 50 MG  TB24 tablet Take 50 mg by mouth daily.  . mirtazapine (REMERON) 7.5 MG tablet TAKE 1 TABLET (7.5 MG TOTAL) BY MOUTH AT BEDTIME. NIGHTLY FOR APPETITE  . vitamin B-12 (CYANOCOBALAMIN) 1000 MCG tablet Take 1 tablet by mouth twice daily On Mondays,Wednesdays and Fridays  . [DISCONTINUED] carvedilol (COREG) 3.125 MG tablet Take one tablet by mouth twice daily with a meal   No facility-administered encounter medications on file as of 06/24/2016.     Review of Systems  Constitutional: Negative for chills, diaphoresis and fever.  HENT: Positive for hearing loss. Negative for congestion, ear pain, postnasal drip, rhinorrhea, sinus pressure, sore throat and tinnitus.   Eyes: Negative.  Negative for discharge and redness.  Respiratory: Positive for cough and chest tightness. Negative for shortness of breath.   Cardiovascular: Negative for chest pain, palpitations and leg swelling (ankles).       Resolved edema BLE. History of systolic heart failure.  Gastrointestinal: Negative for abdominal pain, constipation, diarrhea, nausea and vomiting.       History of diverticulosis. She denies any abdominal pain.  Genitourinary: Positive for frequency. Negative for dysuria, flank pain and urgency.       Urine incontinence  Musculoskeletal:  Positive for back pain. Negative for myalgias and neck pain.       Flat feet with dropped arches. Pin in the right medial malleolar bones. Chronic back pain. Chronic right shoulder pain. Right knee pain. Uses 4 wheel walker.  Skin: Negative.  Negative for rash.       S/p left upper outer lumpectomy  Neurological: Negative for dizziness, tremors and weakness (Generalized).  Hematological:       History of anemia.  Psychiatric/Behavioral: The patient is not nervous/anxious.     Immunization History  Administered Date(s) Administered  . Influenza Whole 11/19/2011, 12/02/2012  . Influenza-Unspecified 12/06/2013, 12/06/2015  . PPD Test 11/10/2014  . Pneumococcal  Polysaccharide-23 02/18/2009  . Tdap 04/02/2016   Pertinent  Health Maintenance Due  Topic Date Due  . PNA vac Low Risk Adult (2 of 2 - PCV13) 02/18/2010  . MAMMOGRAM  10/13/2015  . INFLUENZA VACCINE  09/18/2016  . DEXA SCAN  Completed   Fall Risk  07/13/2015 11/17/2014 04/21/2014 12/17/2012  Falls in the past year? No Yes Yes Yes  Number falls in past yr: - 1 1 1   Injury with Fall? - No No Yes  Risk for fall due to : - Impaired balance/gait;History of fall(s) - -   Functional Status Survey:    Vitals:   06/24/16 1039  BP: (!) 150/90  Pulse: 72  Resp: 19  Temp: 99 F (37.2 C)  SpO2: 97%  Weight: 132 lb (59.9 kg)  Height: 5\' 4"  (1.626 m)   Body mass index is 22.66 kg/m. Physical Exam  Constitutional: She is oriented to person, place, and time. She appears well-developed and well-nourished. No distress.  HENT:  Head: Normocephalic and atraumatic.  Right Ear: External ear normal.  Left Ear: External ear normal.  Nose: Nose normal.  Mouth/Throat: Oropharynx is clear and moist. No oropharyngeal exudate.  Left nostril erythematous  Eyes: Conjunctivae and EOM are normal. Pupils are equal, round, and reactive to light. Right eye exhibits no discharge. Left eye exhibits no discharge. No scleral icterus.  Neck: Normal range of motion. Neck supple. No JVD present. No thyromegaly present.  Cardiovascular: Normal rate and regular rhythm.   Murmur heard.  Systolic murmur is present with a grade of 2/6  Systolic murmurs 4/6  Pulmonary/Chest: Effort normal. No respiratory distress. She has no wheezes. She has no rales. She exhibits no tenderness.  Abdominal: Soft. Bowel sounds are normal. She exhibits no distension. There is no tenderness. There is no rebound.  Musculoskeletal: Normal range of motion. She exhibits no edema (1+  bipedal) or tenderness.  Right knee deformity with osteoarthritic changes.  Lymphadenopathy:    She has no cervical adenopathy.  Neurological: She is alert  and oriented to person, place, and time. She has normal reflexes. No cranial nerve deficit. She exhibits normal muscle tone. Coordination normal.  04/21/14 MMSE 28/30. Failed clock drawing.  Skin: Skin is warm and dry. No rash noted. She is not diaphoretic. No erythema.  The left upper outer breast s/p lumpectomy  Psychiatric: She has a normal mood and affect. Her behavior is normal. Judgment and thought content normal.    Labs reviewed:  Recent Labs  07/20/15 10/05/15 04/04/16  NA 142 143 144  K 4.1 3.6 4.0  BUN 85* 22* 31*  CREATININE 2.2* 2.2* 2.4*    Recent Labs  10/05/15 04/04/16  AST 12* 12*  ALT 5* 6*  ALKPHOS 36 38    Recent Labs  07/20/15 10/05/15 04/04/16  WBC  5.3 3.9 5.7  HGB 10.3* 11.5* 10.4*  HCT 31* 34* 32*  PLT 178 151 177   Lab Results  Component Value Date   TSH 2.21 04/04/2016   Lab Results  Component Value Date   HGBA1C 5.6 11/06/2014   Lab Results  Component Value Date   CHOL 126 05/28/2016   HDL 44 05/28/2016   LDLCALC 63 05/28/2016   TRIG 102 05/28/2016   CHOLHDL 3.1 11/06/2014   Assessment/Plan  1. Anemia in chronic kidney disease, unspecified CKD stage Continue to monitor  2. Chronic diastolic congestive heart failure ([place]) compensated  3. CKD (chronic kidney disease) stage 3, GFR 30-59 ml/min Stable to improved  4. Essential hypertension Mild elevation in SBP. Considering all heer other problems, I regard this as "controlled".  5. Hyperlipidemia, unspecified hyperlipidemia type controlled  6. Edema, unspecified type improved

## 2016-06-25 DIAGNOSIS — N390 Urinary tract infection, site not specified: Secondary | ICD-10-CM | POA: Diagnosis not present

## 2016-06-28 DIAGNOSIS — N3941 Urge incontinence: Secondary | ICD-10-CM | POA: Diagnosis not present

## 2016-06-28 DIAGNOSIS — R350 Frequency of micturition: Secondary | ICD-10-CM | POA: Diagnosis not present

## 2016-07-26 ENCOUNTER — Encounter: Payer: Self-pay | Admitting: Nurse Practitioner

## 2016-07-29 ENCOUNTER — Non-Acute Institutional Stay (SKILLED_NURSING_FACILITY): Payer: Medicare Other | Admitting: Nurse Practitioner

## 2016-07-29 ENCOUNTER — Encounter: Payer: Self-pay | Admitting: Nurse Practitioner

## 2016-07-29 DIAGNOSIS — I10 Essential (primary) hypertension: Secondary | ICD-10-CM | POA: Diagnosis not present

## 2016-07-29 DIAGNOSIS — N189 Chronic kidney disease, unspecified: Secondary | ICD-10-CM

## 2016-07-29 DIAGNOSIS — R609 Edema, unspecified: Secondary | ICD-10-CM | POA: Diagnosis not present

## 2016-07-29 DIAGNOSIS — N183 Chronic kidney disease, stage 3 unspecified: Secondary | ICD-10-CM

## 2016-07-29 DIAGNOSIS — N318 Other neuromuscular dysfunction of bladder: Secondary | ICD-10-CM

## 2016-07-29 DIAGNOSIS — D631 Anemia in chronic kidney disease: Secondary | ICD-10-CM | POA: Diagnosis not present

## 2016-07-29 NOTE — Assessment & Plan Note (Signed)
Hgb 10.4 04/04/16, update CBC

## 2016-07-29 NOTE — Assessment & Plan Note (Signed)
Permissive blood pressure control, continue Carvedilol 6.25mg  bid, Clonidine 0.1mg  bid, Imdur 60mg , Furosemide 20mg , 04/04/16 wbc 5.7, Hgb 10.4, plt 177, Na 144, K 4.0, Bun 31, creat 2.44, TSH 2.21, BNP 123.2

## 2016-07-29 NOTE — Assessment & Plan Note (Signed)
Not apparent BLE, continue Furosemide 20mg  daily, Kcl 97meq, 04/04/16 wbc 5.7, Hgb 10.4, plt 177, Na 144, K 4.0, Bun 31, creat 2.44, TSH 2.21, BNP 123.2

## 2016-07-29 NOTE — Progress Notes (Signed)
This encounter was created in error - please disregard.

## 2016-07-29 NOTE — Assessment & Plan Note (Signed)
Improved since 04/04/16 Myrbetriq per urology

## 2016-07-29 NOTE — Assessment & Plan Note (Signed)
Creat 2.44 04/04/16, creat 2,83 11/21/14, update CMP

## 2016-07-29 NOTE — Progress Notes (Signed)
[facility] Room Number: 5 Place of Service:  SNF (31) Provider:  Nakema Fake, Manxie  NP  Estill Dooms, MD  Patient Care Team: Estill Dooms, MD as PCP - General (Internal Medicine) Juanita Craver, MD as Consulting Physician (Gastroenterology) Shon Hough, MD as Consulting Physician (Ophthalmology) Thornell Sartorius, MD as Consulting Physician (Otolaryngology) [place] Jamisyn Langer X, NP as Nurse Practitioner (Nurse Practitioner) Bo Merino, MD as Consulting Physician (Rheumatology) Suella Broad, MD as Consulting Physician (Physical Medicine and Rehabilitation) Netta Cedars, MD as Consulting Physician (Orthopedic Surgery) Fanny Skates, MD as Consulting Physician (General Surgery) Truitt Merle, MD as Consulting Physician (Hematology) Thea Silversmith, MD (Inactive) as Consulting Physician (Radiation Oncology) Rockwell Germany, RN as Registered Nurse Mauro Kaufmann, RN as Registered Nurse Jake Shark, Johny Blamer, NP as Nurse Practitioner (Nurse Practitioner) [name], MD as Consulting Physician (Cardiology)  Extended Emergency Contact Information Primary Emergency Contact: [name],[name] Address: [address]          [place] [address] Johnnette Litter of [place] Phone: [phone] Mobile Phone: [phone] Relation: Daughter Secondary Emergency Contact: [name],[name]  [place] Mobile Phone: [phone] Relation: None  Code Status:  DNR Goals of care: Advanced Directive information Advanced Directives 07/29/2016  Does Patient Have a Medical Advance Directive? Yes  Type of Paramedic of [place];Out of facility DNR (pink MOST or yellow form)  Does patient want to make changes to medical advance directive? No - Patient declined  Copy of [place] in Chart? Yes  Pre-existing out of facility DNR order (yellow form or pink MOST form) Yellow form placed in chart  (order not valid for inpatient use)     Chief Complaint  Patient presents with  . Medical Management of Chronic Issues    HPI:  Pt is a 83 y.o. female seen today for medical management of chronic diseases.      Hx of CHF compensated clinically while on Furosemide 20mg , Kcl 62meq, urinary frequency, managed with Myrbetriq, CKD creat 2.4 2/15/18Blood pressure is not controlled on Carvedilol 3.125mg  bid, Clonidine 0.1mg  bid, Imdur 60mg  daily. Her mood is stable while on Mirtazapine 7.5mg  daily. X-ray R knee showed Advanced lateral compartment arthropathy which could be due to advanced osteoarthritis or previous AVN. Large knee joint effusion. Probable small intra-articular loose bodies.taking Tylenol 650mg  bid for knee pain, knee brace when she is out of bed.   Past Medical History:  Diagnosis Date  . Arthritis    Osteoarthritis  . Cancer of left breast [facility]) August 2016  . Cerebral atherosclerosis 12/19/2012  . Cerebral atrophy 12/19/2012  . Cerebral ischemia 12/19/2012   Microvascular   . CKD (chronic kidney disease) stage 3, GFR 30-59 ml/min 11/03/2014  . Cough   . Dementia   . Diverticulosis of colon (without mention of hemorrhage)   . Edema   . Flat feet   . GERD (gastroesophageal reflux disease)   . Glaucoma   . Heart murmur   . Hyperlipidemia   . Hypertension   . Hypertonicity of bladder   . Ischemic cardiomyopathy 11/04/2014  . Lumbago   . Memory change 03/28/2016  . Osteoarthrosis, unspecified whether generalized or localized, unspecified site   . Other malaise and fatigue   . Pain in joint, pelvic region and thigh   . Pain in joint, shoulder region   . Personal history of fall   . RLQ abdominal pain 01/17/2011  . Senile osteoporosis   .  Small bowel obstruction ([place])   . STEMI (ST elevation myocardial infarction) ([place]) 11/04/2014  . Unstable gait 11/04/2014  . Urine, incontinence, stress female 09/18/2014   Past Surgical History:  Procedure Laterality Date  .  BREAST LUMPECTOMY Bilateral   . BREAST LUMPECTOMY WITH RADIOACTIVE SEED LOCALIZATION Left 03/15/2015   Procedure: LEFT BREAST LUMPECTOMY WITH RADIOACTIVE SEED LOCALIZATION;  Surgeon: Fanny Skates, MD;  Location: [place];  Service: General;  Laterality: Left;  . CARDIAC CATHETERIZATION N/A 11/08/2014   Procedure: Left Heart Cath and Coronary Angiography;  Surgeon: [name], MD;  Location: [place] [facility];  Service: Cardiovascular;  Laterality: N/A;  . COLON SURGERY  02/1996   Colectomy, partial for diverticular bleed 90%  . EYE SURGERY    . JOINT REPLACEMENT Left 12/2000   Left knee; Dr. Wynelle Link  . LAPAROSCOPIC LYSIS OF ADHESIONS  11/15/10   Exploratory  . TONSILLECTOMY      Allergies  Allergen Reactions  . Sulfa Antibiotics Itching    Outpatient Encounter Prescriptions as of 07/29/2016  Medication Sig  . acetaminophen (TYLENOL) 325 MG tablet Take 650 mg by mouth 2 (two) times daily. Take 2 tablets every 6 hours as needed for mild pain or fever  . alum & mag hydroxide-simeth (MAALOX/MYLANTA) 200-200-20 MG/5ML suspension Take by mouth. 9ml every 4 hours as needed for indigestion  . atorvastatin (LIPITOR) 20 MG tablet Take one tablet by mouth once daily for cholesterol  . BETIMOL 0.5 % ophthalmic solution Place 1 drop into both eyes every morning.   . carvedilol (COREG) 6.25 MG tablet Take 6.25 mg by mouth. Take one tablet twice daily  . cloNIDine (CATAPRES) 0.1 MG tablet Take 0.1 mg by mouth 2 (two) times daily.  . Coenzyme Q10 50 MG TABS Take one tablet by mouth at bedtime  . famotidine (PEPCID) 20 MG tablet Take 20 mg by mouth daily.  . furosemide (LASIX) 20 MG tablet Take 20 mg by mouth daily.  . Hypromellose (ARTIFICIAL TEARS OP) Apply 2 drops to eye 2 (two) times daily. Both eyes  . isosorbide mononitrate (IMDUR) 60 MG 24 hr tablet Take 60 mg by mouth at bedtime.   Marland Kitchen KLOR-CON 10 10 MEQ tablet Take 10 mEq by mouth daily.  Marland Kitchen latanoprost (XALATAN) 0.005 % ophthalmic solution  Place 1 drop into both eyes at bedtime.   . mirabegron ER (MYRBETRIQ) 50 MG TB24 tablet Take 50 mg by mouth daily.  . mirtazapine (REMERON) 7.5 MG tablet TAKE 1 TABLET (7.5 MG TOTAL) BY MOUTH AT BEDTIME. NIGHTLY FOR APPETITE  . vitamin B-12 (CYANOCOBALAMIN) 1000 MCG tablet Take 1 tablet by mouth twice daily On Mondays,Wednesdays and Fridays   No facility-administered encounter medications on file as of 07/29/2016.     Review of Systems  Constitutional: Negative for chills, diaphoresis and fever.  HENT: Positive for hearing loss. Negative for congestion, ear pain, postnasal drip, rhinorrhea, sinus pressure, sore throat and tinnitus.   Eyes: Negative.  Negative for discharge and redness.  Respiratory: Positive for cough and chest tightness. Negative for shortness of breath.   Cardiovascular: Negative for chest pain, palpitations and leg swelling (ankles).       Resolved edema BLE. History of systolic heart failure.  Gastrointestinal: Negative for abdominal pain, constipation, diarrhea, nausea and vomiting.       History of diverticulosis. She denies any abdominal pain.  Genitourinary: Positive for frequency. Negative for dysuria, flank pain and urgency.       Urine incontinence  Musculoskeletal: Positive for  arthralgias and back pain. Negative for myalgias and neck pain.       Flat feet with dropped arches. Pin in the right medial malleolar bones. Chronic back pain. Chronic right shoulder pain. Right knee pain. Uses 4 wheel walker.  Skin: Negative.  Negative for rash.       S/p left upper outer lumpectomy.   Neurological: Negative for dizziness, tremors and weakness (Generalized).  Hematological:       History of anemia.  Psychiatric/Behavioral: The patient is not nervous/anxious.     Immunization History  Administered Date(s) Administered  . Influenza Whole 11/19/2011, 12/02/2012  . Influenza-Unspecified 12/06/2013, 12/06/2015  . PPD Test 11/10/2014  . Pneumococcal  Polysaccharide-23 02/18/2009  . Tdap 04/02/2016   Pertinent  Health Maintenance Due  Topic Date Due  . PNA vac Low Risk Adult (2 of 2 - PCV13) 02/18/2010  . MAMMOGRAM  10/13/2015  . INFLUENZA VACCINE  09/18/2016  . DEXA SCAN  Completed   Fall Risk  07/13/2015 11/17/2014 04/21/2014 12/17/2012  Falls in the past year? No Yes Yes Yes  Number falls in past yr: - 1 1 1   Injury with Fall? - No No Yes  Risk for fall due to : - Impaired balance/gait;History of fall(s) - -   Functional Status Survey:    Vitals:   07/29/16 1335  BP: 120/62  Pulse: 68  Resp: 20  Temp: 98.1 F (36.7 C)  Weight: 136 lb 4.8 oz (61.8 kg)  Height: 5\' 4"  (1.626 m)   Body mass index is 23.4 kg/m. Physical Exam  Constitutional: She is oriented to person, place, and time. She appears well-developed and well-nourished. No distress.  HENT:  Head: Normocephalic and atraumatic.  Right Ear: External ear normal.  Left Ear: External ear normal.  Nose: Nose normal.  Mouth/Throat: Oropharynx is clear and moist. No oropharyngeal exudate.  Left nostril erythematous  Eyes: Conjunctivae and EOM are normal. Pupils are equal, round, and reactive to light. Right eye exhibits no discharge. Left eye exhibits no discharge. No scleral icterus.  Neck: Normal range of motion. Neck supple. No JVD present. No thyromegaly present.  Cardiovascular: Normal rate and regular rhythm.   Murmur heard.  Systolic murmur is present with a grade of 2/6  Systolic murmurs 4/6  Pulmonary/Chest: Effort normal. No respiratory distress. She has no wheezes. She has rales (deep rattling in chest. rhonchi.). She exhibits no tenderness.  Abdominal: Soft. Bowel sounds are normal. She exhibits no distension. There is no tenderness. There is no rebound.  Musculoskeletal: Normal range of motion. She exhibits tenderness. She exhibits no edema (1+  bipedal).  Right knee deformity with osteoarthritic changes.  Lymphadenopathy:    She has no cervical  adenopathy.  Neurological: She is alert and oriented to person, place, and time. She has normal reflexes. No cranial nerve deficit. She exhibits normal muscle tone. Coordination normal.  04/21/14 MMSE 28/30. Failed clock drawing.  Skin: Skin is warm and dry. No rash noted. She is not diaphoretic. No erythema.  The left upper outer breast s/p lumpectomy.   Psychiatric: She has a normal mood and affect. Her behavior is normal. Judgment and thought content normal.    Labs reviewed:  Recent Labs  10/05/15 04/04/16  NA 143 144  K 3.6 4.0  BUN 22* 31*  CREATININE 2.2* 2.4*    Recent Labs  10/05/15 04/04/16  AST 12* 12*  ALT 5* 6*  ALKPHOS 36 38    Recent Labs  10/05/15 04/04/16  WBC 3.9  5.7  HGB 11.5* 10.4*  HCT 34* 32*  PLT 151 177   Lab Results  Component Value Date   TSH 2.21 04/04/2016   Lab Results  Component Value Date   HGBA1C 5.6 11/06/2014   Lab Results  Component Value Date   CHOL 126 05/28/2016   HDL 44 05/28/2016   LDLCALC 63 05/28/2016   TRIG 102 05/28/2016   CHOLHDL 3.1 11/06/2014    Significant Diagnostic Results in last 30 days:  No results found.  Assessment/Plan CKD (chronic kidney disease) stage 3, GFR 30-59 ml/min Creat 2.44 04/04/16, creat 2,83 11/21/14, update CMP  Edema Not apparent BLE, continue Furosemide 20mg  daily, Kcl 21meq, 04/04/16 wbc 5.7, Hgb 10.4, plt 177, Na 144, K 4.0, Bun 31, creat 2.44, TSH 2.21, BNP 123.2   Anemia Hgb 10.4 04/04/16, update CBC  Hypertension Permissive blood pressure control, continue Carvedilol 6.25mg  bid, Clonidine 0.1mg  bid, Imdur 60mg , Furosemide 20mg , 04/04/16 wbc 5.7, Hgb 10.4, plt 177, Na 144, K 4.0, Bun 31, creat 2.44, TSH 2.21, BNP 123.2  Hypertonicity of bladder Improved since 04/04/16 Myrbetriq per urology     Family/ staff Communication: SNF  Labs/tests ordered: CBC CMP

## 2016-07-29 NOTE — Progress Notes (Signed)
Location:   [place] Room Number: 5 Place of Service:  SNF (31) Provider:  Mast, Manxie  NP  Estill Dooms, MD  Patient Care Team: Estill Dooms, MD as PCP - General (Internal Medicine) Juanita Craver, MD as Consulting Physician (Gastroenterology) Shon Hough, MD as Consulting Physician (Ophthalmology) Thornell Sartorius, MD as Consulting Physician (Otolaryngology) [place] Mast, Man X, NP as Nurse Practitioner (Nurse Practitioner) Bo Merino, MD as Consulting Physician (Rheumatology) Suella Broad, MD as Consulting Physician (Physical Medicine and Rehabilitation) Netta Cedars, MD as Consulting Physician (Orthopedic Surgery) Fanny Skates, MD as Consulting Physician (General Surgery) Truitt Merle, MD as Consulting Physician (Hematology) Thea Silversmith, MD (Inactive) as Consulting Physician (Radiation Oncology) Rockwell Germany, RN as Registered Nurse Mauro Kaufmann, RN as Registered Nurse Jake Shark, Johny Blamer, NP as Nurse Practitioner (Nurse Practitioner) [name], MD as Consulting Physician (Cardiology)  Extended Emergency Contact Information Primary Emergency Contact: [name],[name] Address: [address]          [place] [address] Johnnette Litter of [place] Phone: [phone] Mobile Phone: [phone] Relation: Daughter Secondary Emergency Contact: [name],[name]  [place] Mobile Phone: [phone] Relation: None  Code Status: DNR Goals of care: Advanced Directive information Advanced Directives 07/29/2016  Does Patient Have a Medical Advance Directive? Yes  Type of Paramedic of [place];Out of facility DNR (pink MOST or yellow form)  Does patient want to make changes to medical advance directive? No - Patient declined  Copy of [place] in Chart? Yes  Pre-existing out of facility DNR order (yellow form or pink MOST form) Yellow  form placed in chart (order not valid for inpatient use)     Chief Complaint  Patient presents with  . Medical Management of Chronic Issues    HPI:  Pt is a 83 y.o. female seen today for medical management of chronic diseases.     Past Medical History:  Diagnosis Date  . Arthritis    Osteoarthritis  . Cancer of left breast [facility]) August 2016  . Cerebral atherosclerosis 12/19/2012  . Cerebral atrophy 12/19/2012  . Cerebral ischemia 12/19/2012   Microvascular   . CKD (chronic kidney disease) stage 3, GFR 30-59 ml/min 11/03/2014  . Cough   . Dementia   . Diverticulosis of colon (without mention of hemorrhage)   . Edema   . Flat feet   . GERD (gastroesophageal reflux disease)   . Glaucoma   . Heart murmur   . Hyperlipidemia   . Hypertension   . Hypertonicity of bladder   . Ischemic cardiomyopathy 11/04/2014  . Lumbago   . Memory change 03/28/2016  . Osteoarthrosis, unspecified whether generalized or localized, unspecified site   . Other malaise and fatigue   . Pain in joint, pelvic region and thigh   . Pain in joint, shoulder region   . Personal history of fall   . RLQ abdominal pain 01/17/2011  . Senile osteoporosis   . Small bowel obstruction ([place])   . STEMI (ST elevation myocardial infarction) ([place]) 11/04/2014  . Unstable gait 11/04/2014  . Urine, incontinence, stress female 09/18/2014   Past Surgical History:  Procedure Laterality Date  . BREAST LUMPECTOMY Bilateral   . BREAST LUMPECTOMY WITH RADIOACTIVE SEED LOCALIZATION Left 03/15/2015   Procedure: LEFT BREAST LUMPECTOMY WITH RADIOACTIVE SEED LOCALIZATION;  Surgeon: Fanny Skates, MD;  Location: [place];  Service: General;  Laterality: Left;  . CARDIAC CATHETERIZATION N/A 11/08/2014   Procedure: Left  Heart Cath and Coronary Angiography;  Surgeon: [name], MD;  Location: [place] [facility];  Service: Cardiovascular;  Laterality: N/A;  . COLON SURGERY  02/1996   Colectomy, partial for diverticular bleed 90%  . EYE  SURGERY    . JOINT REPLACEMENT Left 12/2000   Left knee; Dr. Wynelle Link  . LAPAROSCOPIC LYSIS OF ADHESIONS  11/15/10   Exploratory  . TONSILLECTOMY      Allergies  Allergen Reactions  . Sulfa Antibiotics Itching    Outpatient Encounter Prescriptions as of 07/29/2016  Medication Sig  . acetaminophen (TYLENOL) 325 MG tablet Take 650 mg by mouth 2 (two) times daily. Take 2 tablets every 6 hours as needed for mild pain or fever  . alum & mag hydroxide-simeth (MAALOX/MYLANTA) 200-200-20 MG/5ML suspension Take by mouth. 52ml every 4 hours as needed for indigestion  . atorvastatin (LIPITOR) 20 MG tablet Take one tablet by mouth once daily for cholesterol  . BETIMOL 0.5 % ophthalmic solution Place 1 drop into both eyes every morning.   . carvedilol (COREG) 6.25 MG tablet Take 6.25 mg by mouth. Take one tablet twice daily  . cloNIDine (CATAPRES) 0.1 MG tablet Take 0.1 mg by mouth 2 (two) times daily.  . Coenzyme Q10 50 MG TABS Take one tablet by mouth at bedtime  . famotidine (PEPCID) 20 MG tablet Take 20 mg by mouth daily.  . furosemide (LASIX) 20 MG tablet Take 20 mg by mouth daily.  . Hypromellose (ARTIFICIAL TEARS OP) Apply 2 drops to eye 2 (two) times daily. Both eyes  . isosorbide mononitrate (IMDUR) 60 MG 24 hr tablet Take 60 mg by mouth at bedtime.   Marland Kitchen KLOR-CON 10 10 MEQ tablet Take 10 mEq by mouth daily.  Marland Kitchen latanoprost (XALATAN) 0.005 % ophthalmic solution Place 1 drop into both eyes at bedtime.   . mirabegron ER (MYRBETRIQ) 50 MG TB24 tablet Take 50 mg by mouth daily.  . mirtazapine (REMERON) 7.5 MG tablet TAKE 1 TABLET (7.5 MG TOTAL) BY MOUTH AT BEDTIME. NIGHTLY FOR APPETITE  . vitamin B-12 (CYANOCOBALAMIN) 1000 MCG tablet Take 1 tablet by mouth twice daily On Mondays,Wednesdays and Fridays   No facility-administered encounter medications on file as of 07/29/2016.     Review of Systems  Immunization History  Administered Date(s) Administered  . Influenza Whole 11/19/2011,  12/02/2012  . Influenza-Unspecified 12/06/2013, 12/06/2015  . PPD Test 11/10/2014  . Pneumococcal Polysaccharide-23 02/18/2009  . Tdap 04/02/2016   Pertinent  Health Maintenance Due  Topic Date Due  . PNA vac Low Risk Adult (2 of 2 - PCV13) 02/18/2010  . MAMMOGRAM  10/13/2015  . INFLUENZA VACCINE  09/18/2016  . DEXA SCAN  Completed   Fall Risk  07/13/2015 11/17/2014 04/21/2014 12/17/2012  Falls in the past year? No Yes Yes Yes  Number falls in past yr: - 1 1 1   Injury with Fall? - No No Yes  Risk for fall due to : - Impaired balance/gait;History of fall(s) - -   Functional Status Survey:    Vitals:   07/29/16 1335  BP: 120/62  Pulse: 68  Resp: 20  Temp: 98.1 F (36.7 C)  Weight: 136 lb 4.8 oz (61.8 kg)  Height: 5\' 4"  (1.626 m)   Body mass index is 23.4 kg/m. Physical Exam  Labs reviewed:  Recent Labs  10/05/15 04/04/16  NA 143 144  K 3.6 4.0  BUN 22* 31*  CREATININE 2.2* 2.4*    Recent Labs  10/05/15 04/04/16  AST 12*  12*  ALT 5* 6*  ALKPHOS 36 38    Recent Labs  10/05/15 04/04/16  WBC 3.9 5.7  HGB 11.5* 10.4*  HCT 34* 32*  PLT 151 177   Lab Results  Component Value Date   TSH 2.21 04/04/2016   Lab Results  Component Value Date   HGBA1C 5.6 11/06/2014   Lab Results  Component Value Date   CHOL 126 05/28/2016   HDL 44 05/28/2016   LDLCALC 63 05/28/2016   TRIG 102 05/28/2016   CHOLHDL 3.1 11/06/2014    Significant Diagnostic Results in last 30 days:  No results found.  Assessment/Plan 1. CKD (chronic kidney disease) stage 3, GFR 30-59 ml/min   2. Edema, unspecified type   3. Anemia in chronic kidney disease, unspecified CKD stage   4. Essential hypertension   5. Hypertonicity of bladder     Family/ staff Communication:   Labs/tests ordered:

## 2016-07-30 DIAGNOSIS — I5043 Acute on chronic combined systolic (congestive) and diastolic (congestive) heart failure: Secondary | ICD-10-CM | POA: Diagnosis not present

## 2016-07-30 DIAGNOSIS — G9341 Metabolic encephalopathy: Secondary | ICD-10-CM | POA: Diagnosis not present

## 2016-07-30 LAB — HEPATIC FUNCTION PANEL
ALK PHOS: 36 (ref 25–125)
ALT: 19 (ref 7–35)
ALT: 6 — AB (ref 7–35)
AST: 14 (ref 13–35)
AST: 15 (ref 13–35)
Alkaline Phosphatase: 33 (ref 25–125)
BILIRUBIN, TOTAL: 0.5
Bilirubin, Total: 0.4

## 2016-07-30 LAB — CBC AND DIFFERENTIAL
HCT: 28 — AB (ref 36–46)
HEMATOCRIT: 33 — AB (ref 36–46)
HEMOGLOBIN: 11 — AB (ref 12.0–16.0)
Hemoglobin: 9.3 — AB (ref 12.0–16.0)
Platelets: 198 (ref 150–399)
Platelets: 217 (ref 150–399)
WBC: 10.7
WBC: 6

## 2016-07-30 LAB — BASIC METABOLIC PANEL
BUN: 41 — AB (ref 4–21)
BUN: 67 — AB (ref 4–21)
CREATININE: 2.6 — AB (ref 0.5–1.1)
Creatinine: 2.1 — AB (ref 0.5–1.1)
GLUCOSE: 76
GLUCOSE: 74
POTASSIUM: 4.4 (ref 3.4–5.3)
Potassium: 4.2 (ref 3.4–5.3)
SODIUM: 142 (ref 137–147)
SODIUM: 139 (ref 137–147)

## 2016-08-13 ENCOUNTER — Encounter: Payer: Self-pay | Admitting: Internal Medicine

## 2016-08-13 ENCOUNTER — Non-Acute Institutional Stay (SKILLED_NURSING_FACILITY): Payer: Medicare Other | Admitting: Internal Medicine

## 2016-08-13 DIAGNOSIS — F329 Major depressive disorder, single episode, unspecified: Secondary | ICD-10-CM | POA: Diagnosis not present

## 2016-08-13 DIAGNOSIS — F0390 Unspecified dementia without behavioral disturbance: Secondary | ICD-10-CM | POA: Diagnosis not present

## 2016-08-13 DIAGNOSIS — F32A Depression, unspecified: Secondary | ICD-10-CM

## 2016-08-13 DIAGNOSIS — R638 Other symptoms and signs concerning food and fluid intake: Secondary | ICD-10-CM

## 2016-08-13 NOTE — Progress Notes (Signed)
Location:  [place] Room Number: 5 Place of Service:  SNF ([phone]) Provider:    [name], MD  Patient Care Team: [name], MD as PCP - General (Internal Medicine) Juanita Craver, MD as Consulting Physician (Gastroenterology) Shon Hough, MD as Consulting Physician (Ophthalmology) Thornell Sartorius, MD as Consulting Physician (Otolaryngology) [place], Man X, NP as Nurse Practitioner (Nurse Practitioner) Bo Merino, MD as Consulting Physician (Rheumatology) Suella Broad, MD as Consulting Physician (Physical Medicine and Rehabilitation) Netta Cedars, MD as Consulting Physician (Orthopedic Surgery) Fanny Skates, MD as Consulting Physician (General Surgery) Truitt Merle, MD as Consulting Physician (Hematology) Thea Silversmith, MD (Inactive) as Consulting Physician (Radiation Oncology) Rockwell Germany, RN as Registered Nurse Mauro Kaufmann, RN as Registered Nurse Jake Shark, Johny Blamer, NP as Nurse Practitioner (Nurse Practitioner) [name], MD as Consulting Physician (Cardiology)  Extended Emergency Contact Information Primary Emergency Contact: [name],[name] Address: [address]          [place] [address] [place] of [place] Phone: [phone] Mobile Phone: [phone] Relation: Daughter Secondary Emergency Contact: [name],[name]  [place] Mobile Phone: [phone] Relation: None  Code Status:  DNR  Goals of care: Advanced Directive information Advanced Directives 07/29/2016  Does Patient Have a Medical Advance Directive? Yes  Type of Paramedic of [place];Out of facility DNR (pink MOST or yellow form)  Does patient want to make changes to medical advance directive? No - Patient declined  Copy of [place] in Chart? Yes  Pre-existing out of facility DNR order (yellow form or pink MOST form) Yellow form placed in  chart (order not valid for inpatient use)     Chief Complaint  Patient presents with  . Acute Visit    follow up on mood and appetite    HPI:  Pt is a 83 y.o. female seen today for an acute visit for medication review. She has been on remeron for several years at a low dose to help stimulate appetite. Her po intake has been good. She denies any trouble eating. Per nursing, she eats her meals.    Past Medical History:  Diagnosis Date  . Arthritis    Osteoarthritis  . Cancer of left breast [facility]) August 2016  . Cerebral atherosclerosis 12/19/2012  . Cerebral atrophy 12/19/2012  . Cerebral ischemia 12/19/2012   Microvascular   . CKD (chronic kidney disease) stage 3, GFR 30-59 ml/min 11/03/2014  . Cough   . Dementia   . Diverticulosis of colon (without mention of hemorrhage)   . Edema   . Flat feet   . GERD (gastroesophageal reflux disease)   . Glaucoma   . Heart murmur   . Hyperlipidemia   . Hypertension   . Hypertonicity of bladder   . Ischemic cardiomyopathy 11/04/2014  . Lumbago   . Memory change 03/28/2016  . Osteoarthrosis, unspecified whether generalized or localized, unspecified site   . Other malaise and fatigue   . Pain in joint, pelvic region and thigh   . Pain in joint, shoulder region   . Personal history of fall   . RLQ abdominal pain 01/17/2011  . Senile osteoporosis   . Small bowel obstruction ([place])   . STEMI (ST elevation myocardial infarction) ([place]) 11/04/2014  . Unstable gait 11/04/2014  . Urine, incontinence, stress female 09/18/2014   Past Surgical History:  Procedure Laterality Date  . BREAST LUMPECTOMY Bilateral   . BREAST LUMPECTOMY WITH RADIOACTIVE SEED LOCALIZATION Left 03/15/2015  Procedure: LEFT BREAST LUMPECTOMY WITH RADIOACTIVE SEED LOCALIZATION;  Surgeon: Fanny Skates, MD;  Location: [facility];  Service: General;  Laterality: Left;  . CARDIAC CATHETERIZATION N/A 11/08/2014   Procedure: Left Heart Cath and Coronary Angiography;  Surgeon: [name], MD;  Location: [place] CV LAB;  Service: Cardiovascular;  Laterality: N/A;  . COLON SURGERY  02/1996   Colectomy, partial for diverticular bleed 90%  . EYE SURGERY    . JOINT REPLACEMENT Left 12/2000   Left knee; Dr. Wynelle Link  . LAPAROSCOPIC LYSIS OF ADHESIONS  11/15/10   Exploratory  . TONSILLECTOMY      Allergies  Allergen Reactions  . Sulfa Antibiotics Itching    Outpatient Encounter Prescriptions as of 08/13/2016  Medication Sig  . acetaminophen (TYLENOL) 325 MG tablet Take 650 mg by mouth 2 (two) times daily. Take 2 tablets every 6 hours as needed for mild pain or fever  . alum & mag hydroxide-simeth (MAALOX/MYLANTA) 200-200-20 MG/5ML suspension Take by mouth. 89ml every 4 hours as needed for indigestion  . atorvastatin (LIPITOR) 20 MG tablet Take one tablet by mouth once daily for cholesterol  . BETIMOL 0.5 % ophthalmic solution Place 1 drop into both eyes every morning.   . carvedilol (COREG) 6.25 MG tablet Take 6.25 mg by mouth 2 (two) times daily.   . cloNIDine (CATAPRES) 0.1 MG tablet Take 0.1 mg by mouth 2 (two) times daily.  . Coenzyme Q10 50 MG TABS Take one tablet by mouth at bedtime  . famotidine (PEPCID) 20 MG tablet Take 20 mg by mouth daily.  . furosemide (LASIX) 20 MG tablet Take 20 mg by mouth daily.  . Hypromellose (ARTIFICIAL TEARS OP) Apply 2 drops to eye 2 (two) times daily. Both eyes  . isosorbide mononitrate (IMDUR) 60 MG 24 hr tablet Take 60 mg by mouth at bedtime.   Marland Kitchen KLOR-CON 10 10 MEQ tablet Take 10 mEq by mouth daily.  Marland Kitchen latanoprost (XALATAN) 0.005 % ophthalmic solution Place 1 drop into both eyes at bedtime.   . mirtazapine (REMERON) 7.5 MG tablet TAKE 1 TABLET (7.5 MG TOTAL) BY MOUTH AT BEDTIME. NIGHTLY FOR APPETITE  . vitamin B-12 (CYANOCOBALAMIN) 1000 MCG tablet Take 1 tablet by mouth twice daily On Mondays,Wednesdays and Fridays  . [DISCONTINUED] mirabegron ER (MYRBETRIQ) 50 MG TB24 tablet Take 50 mg by mouth daily.   No  facility-administered encounter medications on file as of 08/13/2016.     Review of Systems  Constitutional: Negative for appetite change, chills, fever and unexpected weight change.  HENT: Negative for congestion.   Respiratory: Negative for cough and shortness of breath.   Cardiovascular: Negative for chest pain.  Gastrointestinal: Negative for abdominal pain, diarrhea, nausea and vomiting.  Musculoskeletal:       No fall reported  Neurological: Negative for syncope.  Psychiatric/Behavioral: Negative for behavioral problems, dysphoric mood and hallucinations.    Immunization History  Administered Date(s) Administered  . Influenza Whole 11/19/2011, 12/02/2012  . Influenza-Unspecified 12/06/2013, 12/06/2015  . PPD Test 11/10/2014  . Pneumococcal Polysaccharide-23 02/18/2009  . Tdap 04/02/2016   Pertinent  Health Maintenance Due  Topic Date Due  . PNA vac Low Risk Adult (2 of 2 - PCV13) 02/18/2010  . MAMMOGRAM  10/13/2015  . INFLUENZA VACCINE  09/18/2016  . DEXA SCAN  Completed   Fall Risk  07/13/2015 11/17/2014 04/21/2014 12/17/2012  Falls in the past year? No Yes Yes Yes  Number falls in past yr: - 1 1 1   Injury with Fall? -  No No Yes  Risk for fall due to : - Impaired balance/gait;History of fall(s) - -   Functional Status Survey:    Vitals:   08/13/16 1410  BP: 126/70  Pulse: 74  Resp: 20  Temp: 98.1 F (36.7 C)  TempSrc: Oral  Weight: 136 lb 4.8 oz (61.8 kg)  Height: 5\' 4"  (1.626 m)   Body mass index is 23.4 kg/m. Physical Exam  Constitutional: She appears well-developed and well-nourished. No distress.  HENT:  Head: Normocephalic and atraumatic.  Eyes: Conjunctivae are normal. Pupils are equal, round, and reactive to light.  Neck: Normal range of motion. Neck supple.  Cardiovascular: Normal rate and regular rhythm.   Murmur heard. Pulmonary/Chest: Effort normal and breath sounds normal.  Abdominal: Soft. Bowel sounds are normal.  Musculoskeletal:  Can  move all 4 extremities  Neurological: She is alert.  Oriented to person and place  Skin: Skin is warm and dry.  Psychiatric: She has a normal mood and affect.    Labs reviewed:  Recent Labs  10/05/15 04/04/16 07/30/16  NA 143 144 142  K 3.6 4.0 4.2  BUN 22* 31* 41*  CREATININE 2.2* 2.4* 2.1*    Recent Labs  10/05/15 04/04/16 07/30/16  AST 12* 12* 14  ALT 5* 6* 6*  ALKPHOS 36 38 36    Recent Labs  10/05/15 04/04/16 07/30/16  WBC 3.9 5.7 6.0  HGB 11.5* 10.4* 11.0*  HCT 34* 32* 33*  PLT 151 177 198   Lab Results  Component Value Date   TSH 2.21 04/04/2016   Lab Results  Component Value Date   HGBA1C 5.6 11/06/2014   Lab Results  Component Value Date   CHOL 126 05/28/2016   HDL 44 05/28/2016   LDLCALC 63 05/28/2016   TRIG 102 05/28/2016   CHOLHDL 3.1 11/06/2014    Significant Diagnostic Results in last 30 days:  No results found.  Assessment/Plan  Depression Stable mood. Currently on remeron 7.5 mg daily. Discontinue this in attempt for GDR and monitor mood  Dementia without behavioral disturbance supportive care to be provided, d/c remeron and monitor  Appetite alteration Appetite has been stable at present with stable weight per nursing, on appetite stimulant, d/c this for now and monitor.     Family/ staff Communication: reviewed care plan with patient and charge nurse.    Labs/tests ordered:  None  [name], MD Internal Medicine [facility] [address] [place], [place] [address] Cell Phone (Monday-Friday 8 am - 5 pm): [phone] On Call: [phone] and follow prompts after 5 pm and on weekends Office Phone: [phone] Office Fax: [phone]

## 2016-08-24 ENCOUNTER — Encounter (HOSPITAL_COMMUNITY): Payer: Self-pay | Admitting: *Deleted

## 2016-08-24 ENCOUNTER — Emergency Department (HOSPITAL_COMMUNITY): Payer: Medicare Other

## 2016-08-24 ENCOUNTER — Inpatient Hospital Stay (HOSPITAL_COMMUNITY)
Admission: EM | Admit: 2016-08-24 | Discharge: 2016-08-26 | DRG: 871 | Disposition: A | Discharge status: Skilled Nursing Facility | Payer: Medicare Other | Attending: Internal Medicine | Admitting: Internal Medicine

## 2016-08-24 DIAGNOSIS — J189 Pneumonia, unspecified organism: Secondary | ICD-10-CM | POA: Diagnosis present

## 2016-08-24 DIAGNOSIS — I2510 Atherosclerotic heart disease of native coronary artery without angina pectoris: Secondary | ICD-10-CM | POA: Diagnosis present

## 2016-08-24 DIAGNOSIS — R748 Abnormal levels of other serum enzymes: Secondary | ICD-10-CM | POA: Diagnosis present

## 2016-08-24 DIAGNOSIS — I5043 Acute on chronic combined systolic (congestive) and diastolic (congestive) heart failure: Secondary | ICD-10-CM | POA: Diagnosis present

## 2016-08-24 DIAGNOSIS — Z853 Personal history of malignant neoplasm of breast: Secondary | ICD-10-CM

## 2016-08-24 DIAGNOSIS — R7989 Other specified abnormal findings of blood chemistry: Secondary | ICD-10-CM

## 2016-08-24 DIAGNOSIS — N184 Chronic kidney disease, stage 4 (severe): Secondary | ICD-10-CM | POA: Diagnosis not present

## 2016-08-24 DIAGNOSIS — I672 Cerebral atherosclerosis: Secondary | ICD-10-CM | POA: Diagnosis present

## 2016-08-24 DIAGNOSIS — H409 Unspecified glaucoma: Secondary | ICD-10-CM | POA: Diagnosis present

## 2016-08-24 DIAGNOSIS — G9341 Metabolic encephalopathy: Secondary | ICD-10-CM | POA: Diagnosis present

## 2016-08-24 DIAGNOSIS — A419 Sepsis, unspecified organism: Secondary | ICD-10-CM | POA: Diagnosis not present

## 2016-08-24 DIAGNOSIS — Z66 Do not resuscitate: Secondary | ICD-10-CM | POA: Diagnosis present

## 2016-08-24 DIAGNOSIS — I248 Other forms of acute ischemic heart disease: Secondary | ICD-10-CM | POA: Diagnosis not present

## 2016-08-24 DIAGNOSIS — F0390 Unspecified dementia without behavioral disturbance: Secondary | ICD-10-CM | POA: Diagnosis present

## 2016-08-24 DIAGNOSIS — Z96652 Presence of left artificial knee joint: Secondary | ICD-10-CM | POA: Diagnosis present

## 2016-08-24 DIAGNOSIS — M1990 Unspecified osteoarthritis, unspecified site: Secondary | ICD-10-CM | POA: Diagnosis present

## 2016-08-24 DIAGNOSIS — I6782 Cerebral ischemia: Secondary | ICD-10-CM | POA: Diagnosis present

## 2016-08-24 DIAGNOSIS — Y95 Nosocomial condition: Secondary | ICD-10-CM | POA: Diagnosis present

## 2016-08-24 DIAGNOSIS — Z87891 Personal history of nicotine dependence: Secondary | ICD-10-CM

## 2016-08-24 DIAGNOSIS — I255 Ischemic cardiomyopathy: Secondary | ICD-10-CM | POA: Diagnosis present

## 2016-08-24 DIAGNOSIS — Z993 Dependence on wheelchair: Secondary | ICD-10-CM

## 2016-08-24 DIAGNOSIS — R0682 Tachypnea, not elsewhere classified: Secondary | ICD-10-CM | POA: Diagnosis not present

## 2016-08-24 DIAGNOSIS — K219 Gastro-esophageal reflux disease without esophagitis: Secondary | ICD-10-CM | POA: Diagnosis present

## 2016-08-24 DIAGNOSIS — R6520 Severe sepsis without septic shock: Secondary | ICD-10-CM | POA: Diagnosis not present

## 2016-08-24 DIAGNOSIS — R262 Difficulty in walking, not elsewhere classified: Secondary | ICD-10-CM | POA: Diagnosis present

## 2016-08-24 DIAGNOSIS — Z882 Allergy status to sulfonamides status: Secondary | ICD-10-CM

## 2016-08-24 DIAGNOSIS — Z79899 Other long term (current) drug therapy: Secondary | ICD-10-CM

## 2016-08-24 DIAGNOSIS — Z8673 Personal history of transient ischemic attack (TIA), and cerebral infarction without residual deficits: Secondary | ICD-10-CM

## 2016-08-24 DIAGNOSIS — J9601 Acute respiratory failure with hypoxia: Secondary | ICD-10-CM | POA: Diagnosis not present

## 2016-08-24 DIAGNOSIS — M810 Age-related osteoporosis without current pathological fracture: Secondary | ICD-10-CM | POA: Diagnosis present

## 2016-08-24 DIAGNOSIS — I639 Cerebral infarction, unspecified: Secondary | ICD-10-CM | POA: Diagnosis present

## 2016-08-24 DIAGNOSIS — I509 Heart failure, unspecified: Secondary | ICD-10-CM | POA: Diagnosis not present

## 2016-08-24 DIAGNOSIS — I130 Hypertensive heart and chronic kidney disease with heart failure and stage 1 through stage 4 chronic kidney disease, or unspecified chronic kidney disease: Secondary | ICD-10-CM | POA: Diagnosis not present

## 2016-08-24 DIAGNOSIS — N183 Chronic kidney disease, stage 3 unspecified: Secondary | ICD-10-CM | POA: Diagnosis present

## 2016-08-24 DIAGNOSIS — C50412 Malignant neoplasm of upper-outer quadrant of left female breast: Secondary | ICD-10-CM | POA: Diagnosis present

## 2016-08-24 DIAGNOSIS — I10 Essential (primary) hypertension: Secondary | ICD-10-CM | POA: Diagnosis not present

## 2016-08-24 DIAGNOSIS — Z923 Personal history of irradiation: Secondary | ICD-10-CM

## 2016-08-24 DIAGNOSIS — N318 Other neuromuscular dysfunction of bladder: Secondary | ICD-10-CM | POA: Diagnosis present

## 2016-08-24 DIAGNOSIS — I252 Old myocardial infarction: Secondary | ICD-10-CM

## 2016-08-24 DIAGNOSIS — M545 Low back pain: Secondary | ICD-10-CM | POA: Diagnosis present

## 2016-08-24 DIAGNOSIS — R778 Other specified abnormalities of plasma proteins: Secondary | ICD-10-CM | POA: Diagnosis present

## 2016-08-24 DIAGNOSIS — R0602 Shortness of breath: Secondary | ICD-10-CM | POA: Diagnosis not present

## 2016-08-24 DIAGNOSIS — E785 Hyperlipidemia, unspecified: Secondary | ICD-10-CM | POA: Diagnosis present

## 2016-08-24 LAB — BLOOD GAS, ARTERIAL
Acid-base deficit: 4.5 mmol/L — ABNORMAL HIGH (ref 0.0–2.0)
Bicarbonate: 19.2 mmol/L — ABNORMAL LOW (ref 20.0–28.0)
Delivery systems: POSITIVE
Drawn by: 441261
EXPIRATORY PAP: 6
FIO2: 40
Inspiratory PAP: 14
O2 Saturation: 97 %
PATIENT TEMPERATURE: 101
PCO2 ART: 34.4 mmHg (ref 32.0–48.0)
PO2 ART: 108 mmHg (ref 83.0–108.0)
RATE: 14 resp/min
pH, Arterial: 7.373 (ref 7.350–7.450)

## 2016-08-24 LAB — URINALYSIS, ROUTINE W REFLEX MICROSCOPIC
Bilirubin Urine: NEGATIVE
Glucose, UA: NEGATIVE mg/dL
Ketones, ur: NEGATIVE mg/dL
Nitrite: NEGATIVE
PROTEIN: 100 mg/dL — AB
SPECIFIC GRAVITY, URINE: 1.011 (ref 1.005–1.030)
pH: 5 (ref 5.0–8.0)

## 2016-08-24 LAB — BRAIN NATRIURETIC PEPTIDE: B Natriuretic Peptide: 689.5 pg/mL — ABNORMAL HIGH (ref 0.0–100.0)

## 2016-08-24 LAB — TROPONIN I
Troponin I: 0.46 ng/mL (ref <0.03)
Troponin I: 0.31 ng/mL (ref <0.03)

## 2016-08-24 LAB — COMPREHENSIVE METABOLIC PANEL
ALBUMIN: 3.8 g/dL (ref 3.5–5.0)
ALT: 9 U/L — ABNORMAL LOW (ref 14–54)
AST: 17 U/L (ref 15–41)
Alkaline Phosphatase: 42 U/L (ref 38–126)
Anion gap: 10 (ref 5–15)
BUN: 35 mg/dL — AB (ref 6–20)
CHLORIDE: 109 mmol/L (ref 101–111)
CO2: 21 mmol/L — AB (ref 22–32)
Calcium: 8.8 mg/dL — ABNORMAL LOW (ref 8.9–10.3)
Creatinine, Ser: 2.04 mg/dL — ABNORMAL HIGH (ref 0.44–1.00)
GFR calc Af Amer: 24 mL/min — ABNORMAL LOW (ref >60)
GFR calc non Af Amer: 20 mL/min — ABNORMAL LOW (ref >60)
GLUCOSE: 127 mg/dL — AB (ref 65–99)
POTASSIUM: 3.8 mmol/L (ref 3.5–5.1)
Sodium: 140 mmol/L (ref 135–145)
Total Bilirubin: 0.9 mg/dL (ref 0.3–1.2)
Total Protein: 7.1 g/dL (ref 6.5–8.1)

## 2016-08-24 LAB — CBC WITH DIFFERENTIAL/PLATELET
Basophils Absolute: 0 10*3/uL (ref 0.0–0.1)
Basophils Relative: 0 %
EOS PCT: 0 %
Eosinophils Absolute: 0 10*3/uL (ref 0.0–0.7)
HEMATOCRIT: 36.3 % (ref 36.0–46.0)
Hemoglobin: 12.2 g/dL (ref 12.0–15.0)
LYMPHS ABS: 0.9 10*3/uL (ref 0.7–4.0)
Lymphocytes Relative: 8 %
MCH: 30.2 pg (ref 26.0–34.0)
MCHC: 33.6 g/dL (ref 30.0–36.0)
MCV: 89.9 fL (ref 78.0–100.0)
MONOS PCT: 16 %
Monocytes Absolute: 1.7 10*3/uL — ABNORMAL HIGH (ref 0.1–1.0)
NEUTROS ABS: 8.2 10*3/uL — AB (ref 1.7–7.7)
Neutrophils Relative %: 76 %
Platelets: 186 10*3/uL (ref 150–400)
RBC: 4.04 MIL/uL (ref 3.87–5.11)
RDW: 14.3 % (ref 11.5–15.5)
WBC: 10.8 10*3/uL — ABNORMAL HIGH (ref 4.0–10.5)

## 2016-08-24 LAB — I-STAT TROPONIN, ED: Troponin i, poc: 0.19 ng/mL (ref 0.00–0.08)

## 2016-08-24 LAB — INFLUENZA PANEL BY PCR (TYPE A & B)
INFLBPCR: NEGATIVE
Influenza A By PCR: NEGATIVE

## 2016-08-24 LAB — MRSA PCR SCREENING: MRSA BY PCR: NEGATIVE

## 2016-08-24 LAB — I-STAT CG4 LACTIC ACID, ED: LACTIC ACID, VENOUS: 1.38 mmol/L (ref 0.5–1.9)

## 2016-08-24 MED ORDER — ACETAMINOPHEN 650 MG RE SUPP
650.0000 mg | Freq: Once | RECTAL | Status: AC
Start: 2016-08-24 — End: 2016-08-24
  Administered 2016-08-24: 650 mg via RECTAL
  Filled 2016-08-24: qty 1

## 2016-08-24 MED ORDER — CLONIDINE HCL 0.1 MG/24HR TD PTWK
0.1000 mg | MEDICATED_PATCH | TRANSDERMAL | Status: DC
  Administered 2016-08-24: 0.1 mg via TRANSDERMAL
  Filled 2016-08-24: qty 1

## 2016-08-24 MED ORDER — HEPARIN SODIUM (PORCINE) 5000 UNIT/ML IJ SOLN
5000.0000 [IU] | Freq: Three times a day (TID) | INTRAMUSCULAR | Status: DC
  Administered 2016-08-24 – 2016-08-26 (×7): 5000 [IU] via SUBCUTANEOUS
  Filled 2016-08-24 (×7): qty 1

## 2016-08-24 MED ORDER — METHYLPREDNISOLONE SODIUM SUCC 125 MG IJ SOLR
60.0000 mg | Freq: Four times a day (QID) | INTRAMUSCULAR | Status: AC
  Administered 2016-08-24 – 2016-08-25 (×4): 60 mg via INTRAVENOUS
  Filled 2016-08-24 (×4): qty 2

## 2016-08-24 MED ORDER — FUROSEMIDE 10 MG/ML IJ SOLN
20.0000 mg | Freq: Once | INTRAMUSCULAR | Status: AC
  Administered 2016-08-24: 20 mg via INTRAVENOUS
  Filled 2016-08-24: qty 2

## 2016-08-24 MED ORDER — VANCOMYCIN HCL IN DEXTROSE 1-5 GM/200ML-% IV SOLN
1000.0000 mg | Freq: Once | INTRAVENOUS | Status: AC
  Administered 2016-08-24: 1000 mg via INTRAVENOUS
  Filled 2016-08-24: qty 200

## 2016-08-24 MED ORDER — DEXTROSE 5 % IV SOLN
2.0000 g | Freq: Once | INTRAVENOUS | Status: AC
  Administered 2016-08-24: 2 g via INTRAVENOUS
  Filled 2016-08-24: qty 2

## 2016-08-24 MED ORDER — FUROSEMIDE 10 MG/ML IJ SOLN
40.0000 mg | Freq: Once | INTRAMUSCULAR | Status: AC
  Administered 2016-08-24: 40 mg via INTRAVENOUS
  Filled 2016-08-24: qty 4

## 2016-08-24 MED ORDER — SODIUM CHLORIDE 0.9 % IV SOLN: 250.0000 mL | INTRAVENOUS | Status: DC | PRN

## 2016-08-24 MED ORDER — IPRATROPIUM-ALBUTEROL 0.5-2.5 (3) MG/3ML IN SOLN
3.0000 mL | Freq: Four times a day (QID) | RESPIRATORY_TRACT | Status: DC
  Administered 2016-08-24 – 2016-08-26 (×7): 3 mL via RESPIRATORY_TRACT
  Filled 2016-08-24 (×7): qty 3

## 2016-08-24 MED ORDER — PREDNISONE 20 MG PO TABS
40.0000 mg | ORAL_TABLET | Freq: Every day | ORAL | Status: DC
  Administered 2016-08-25 – 2016-08-26 (×2): 40 mg via ORAL
  Filled 2016-08-24 (×2): qty 2

## 2016-08-24 MED ORDER — DEXTROSE 5 % IV SOLN
500.0000 mg | INTRAVENOUS | Status: DC
  Administered 2016-08-25 – 2016-08-26 (×2): 500 mg via INTRAVENOUS
  Filled 2016-08-24 (×3): qty 0.5

## 2016-08-24 MED ORDER — VANCOMYCIN HCL IN DEXTROSE 1-5 GM/200ML-% IV SOLN: 1000.0000 mg | INTRAVENOUS | Status: DC

## 2016-08-24 MED ORDER — SODIUM CHLORIDE 0.9 % IV BOLUS (SEPSIS)
1000.0000 mL | Freq: Once | INTRAVENOUS | Status: AC
  Administered 2016-08-24: 1000 mL via INTRAVENOUS

## 2016-08-24 MED ORDER — CARVEDILOL 6.25 MG PO TABS
6.2500 mg | ORAL_TABLET | Freq: Two times a day (BID) | ORAL | Status: DC
  Administered 2016-08-24 – 2016-08-26 (×5): 6.25 mg via ORAL
  Filled 2016-08-24 (×5): qty 1

## 2016-08-24 MED ORDER — FUROSEMIDE 10 MG/ML IJ SOLN
40.0000 mg | Freq: Every day | INTRAMUSCULAR | Status: DC
  Administered 2016-08-25: 40 mg via INTRAVENOUS
  Filled 2016-08-24: qty 4

## 2016-08-24 MED ORDER — ISOSORBIDE MONONITRATE ER 60 MG PO TB24
60.0000 mg | ORAL_TABLET | Freq: Every day | ORAL | Status: DC
  Administered 2016-08-24 – 2016-08-25 (×2): 60 mg via ORAL
  Filled 2016-08-24 (×2): qty 1

## 2016-08-24 MED ORDER — LATANOPROST 0.005 % OP SOLN
1.0000 [drp] | Freq: Every day | OPHTHALMIC | Status: DC
  Administered 2016-08-24 – 2016-08-25 (×2): 1 [drp] via OPHTHALMIC
  Filled 2016-08-24: qty 2.5

## 2016-08-24 MED ORDER — SODIUM CHLORIDE 0.9% FLUSH
3.0000 mL | Freq: Two times a day (BID) | INTRAVENOUS | Status: DC
  Administered 2016-08-24 – 2016-08-26 (×5): 3 mL via INTRAVENOUS

## 2016-08-24 MED ORDER — ONDANSETRON HCL 4 MG/2ML IJ SOLN: 4.0000 mg | Freq: Four times a day (QID) | INTRAMUSCULAR | Status: DC | PRN

## 2016-08-24 MED ORDER — SODIUM CHLORIDE 0.9% FLUSH: 3.0000 mL | INTRAVENOUS | Status: DC | PRN

## 2016-08-24 MED ORDER — ACETAMINOPHEN 325 MG PO TABS: 650.0000 mg | ORAL_TABLET | ORAL | Status: DC | PRN

## 2016-08-24 NOTE — Progress Notes (Signed)
Pharmacy Antibiotic Note  [name] is a 83 y.o. female admitted on 08/24/2016 with pneumonia.  Cefepime 2gm and Vancomycin 1gm IV x 1 dose each given in the ED Pharmacy has been consulted for Vancomycin and Cefepime dosing.  Plan:  Cefepime 500mg  IV q24h  Vancomycin 1gm IV q48h  Follow renal function, cultures, clinical course  Check vancomycin trough level when appropriate  Height: 5\' 4"  (162.6 cm) Weight: 145 lb (65.8 kg) IBW/kg (Calculated) : 54.7  Temp (24hrs), Avg:100.7 F (38.2 C), Min:100.7 F (38.2 C), Max:100.7 F (38.2 C)   Recent Labs Lab 08/24/16 1053 08/24/16 1106  CREATININE 2.04*  --   LATICACIDVEN  --  1.38    Estimated Creatinine Clearance: 17.4 mL/min (A) (by C-G formula based on SCr of 2.04 mg/dL (H)).    Allergies  Allergen Reactions  . Sulfa Antibiotics Itching    Antimicrobials this admission: 7/7 vanc >>   7/7 cefepime >>    Dose adjustments this admission:   Microbiology results: 7/7 BCx: sent 7/7 UCx: sent   Thank you for allowing pharmacy to be a part of this patient's care.  Everette Rank, PharmD 08/24/2016 12:01 PM

## 2016-08-24 NOTE — Progress Notes (Signed)
CRITICAL VALUE ALERT  Critical Value:  Troponin 0.46  Date & Time Notied:  -08/24/2016  15:40  Provider Notified: Dr. Posey Pronto  Orders Received/Actions taken: no new orders currently

## 2016-08-24 NOTE — H&P (Signed)
[facility] History and Physical   Patient: [name] OVF:[id number]   PCP: [name], MD DOB: Jun 29, 1925   DOA: 08/24/2016   DOS: 08/24/2016   DOS: the patient was seen and examined on 08/24/2016  Patient coming from: The patient is coming from SNF.  Chief Complaint: Lethargy  HPI: [name] is a 83 y.o. female with Past medical history of coronary artery disease, medically managed, chronic kidney disease stage III, CVA, breast cancer, dementia, dyslipidemia, hypertension, chronic systolic CHF. Patient was brought in from friend's home for lethargy as well as increased shortness of breath. Reportedly she also had elevated blood pressure when the EMS arrived to see her. Patient was given Solu-Medrol IV and was placed on C Pap and brought to the hospital. Daughter was at bedside mentioned that patient did not have any other acute complaint on last Tuesday when she saw her last but there was some increased lethargy. Daughter also thinks that she may have some medication changed at the nursing home recently but unaware of the names or changes. Further history is significantly limited since patient is unable to answered what happened but at the time of my evaluation denies having any complaints of chest pain, abdominal pain, nausea, vomiting, increased shortness of breath.  ED Course: Initially was on BiPAP, BiPAP was later on removed. Antibiotics were started and patient was referred for admission.  At her baseline is wheelchair bound And is ependent for most of her ADL; does not manages her medication on her own.  Review of Systems: as mentioned in the history of present illness.  All other systems reviewed and are negative.  Past Medical History:  Diagnosis Date  . Arthritis    Osteoarthritis  . Cancer of left breast [facility]) August 2016  . Cerebral atherosclerosis 12/19/2012  . Cerebral atrophy 12/19/2012  . Cerebral ischemia 12/19/2012   Microvascular   . CKD (chronic  kidney disease) stage 3, GFR 30-59 ml/min 11/03/2014  . Cough   . Dementia   . Diverticulosis of colon (without mention of hemorrhage)   . Edema   . Flat feet   . GERD (gastroesophageal reflux disease)   . Glaucoma   . Heart murmur   . Hyperlipidemia   . Hypertension   . Hypertonicity of bladder   . Ischemic cardiomyopathy 11/04/2014  . Lumbago   . Memory change 03/28/2016  . Osteoarthrosis, unspecified whether generalized or localized, unspecified site   . Other malaise and fatigue   . Pain in joint, pelvic region and thigh   . Pain in joint, shoulder region   . Personal history of fall   . RLQ abdominal pain 01/17/2011  . Senile osteoporosis   . Small bowel obstruction ([place])   . STEMI (ST elevation myocardial infarction) ([place]) 11/04/2014  . Unstable gait 11/04/2014  . Urine, incontinence, stress female 09/18/2014   Past Surgical History:  Procedure Laterality Date  . BREAST LUMPECTOMY Bilateral   . BREAST LUMPECTOMY WITH RADIOACTIVE SEED LOCALIZATION Left 03/15/2015   Procedure: LEFT BREAST LUMPECTOMY WITH RADIOACTIVE SEED LOCALIZATION;  Surgeon: Fanny Skates, MD;  Location: [place];  Service: General;  Laterality: Left;  . CARDIAC CATHETERIZATION N/A 11/08/2014   Procedure: Left Heart Cath and Coronary Angiography;  Surgeon: [name], MD;  Location: [place] CV LAB;  Service: Cardiovascular;  Laterality: N/A;  . COLON SURGERY  02/1996   Colectomy, partial for diverticular bleed 90%  . EYE SURGERY    . JOINT REPLACEMENT Left 12/2000  Left knee; Dr. Wynelle Link  . LAPAROSCOPIC LYSIS OF ADHESIONS  11/15/10   Exploratory  . TONSILLECTOMY     Social History:  reports that she quit smoking about 35 years ago. Her smoking use included Cigarettes. She has a 15.00 pack-year smoking history. She has never used smokeless tobacco. She reports that she drinks about 0.6 oz of alcohol per week . She reports that she does not use drugs.  Allergies  Allergen Reactions  . Sulfa  Antibiotics Itching     Family History  Problem Relation Age of Onset  . Cancer Father 18       colon  . Cancer Maternal Aunt        breast cancer   . Cancer Cousin        breast cancer      Prior to Admission medications   Medication Sig Start Date End Date Taking? Authorizing Provider  acetaminophen (TYLENOL) 325 MG tablet Take 650 mg by mouth 2 (two) times daily. Take 2 tablets every 6 hours as needed for mild pain or fever   Yes [provider]  alum & mag hydroxide-simeth (MAALOX/MYLANTA) 200-200-20 MG/5ML suspension Take by mouth. 38ml every 4 hours as needed for indigestion   Yes [provider]  atorvastatin (LIPITOR) 20 MG tablet Take one tablet by mouth once daily for cholesterol Patient taking differently: Take 20 mg by mouth every evening. Take one tablet by mouth once daily for cholesterol 04/21/14  Yes Reed, Tiffany L, DO  BETIMOL 0.5 % ophthalmic solution Place 1 drop into both eyes every morning.  09/23/10  Yes [provider]  carvedilol (COREG) 6.25 MG tablet Take 6.25 mg by mouth 2 (two) times daily.    Yes [provider]  cloNIDine (CATAPRES) 0.1 MG tablet Take 0.1 mg by mouth 2 (two) times daily.   Yes [provider]  Coenzyme Q10 50 MG TABS Take one tablet by mouth at bedtime   Yes [provider]  famotidine (PEPCID) 20 MG tablet Take 20 mg by mouth daily.   Yes [provider]  furosemide (LASIX) 20 MG tablet Take 20 mg by mouth daily.   Yes [provider]  Hypromellose (ARTIFICIAL TEARS OP) Apply 2 drops to eye 2 (two) times daily. Both eyes   Yes [provider]  isosorbide mononitrate (IMDUR) 60 MG 24 hr tablet Take 60 mg by mouth at bedtime.    Yes [provider]  KLOR-CON 10 10 MEQ tablet Take 10 mEq by mouth daily. 02/06/15  Yes [provider]  latanoprost (XALATAN) 0.005 % ophthalmic solution Place 1 drop into both eyes at bedtime.    Yes [provider]  vitamin B-12 (CYANOCOBALAMIN) 1000 MCG tablet Take 1 tablet by mouth twice daily On Mondays,Wednesdays and Fridays   Yes [provider]  mirtazapine (REMERON) 7.5 MG tablet TAKE 1 TABLET (7.5 MG TOTAL) BY MOUTH AT BEDTIME. NIGHTLY FOR APPETITE Patient not taking: Reported on 08/24/2016 03/15/14   Estill Dooms, MD    Physical Exam: Vitals:   08/24/16 1330 08/24/16 1430 08/24/16 1500 08/24/16 1532  BP: 139/88 (!) 152/73 (!) 147/67   Pulse: 84  81   Resp: 19 20 20    Temp: 99.7 F (37.6 C)     TempSrc:      SpO2: 95% 95% 97% 97%  Weight:  61.5 kg (135 lb 9.3 oz)    Height:  5\' 6"  (1.676 m)      General: Alert,  Awake and Oriented to Place and Person. Appear in marked distress, affect flat Eyes: PERRL, Conjunctiva normal ENT: Oral Mucosa clear moist. Neck: difficult to assess JVD, no Abnormal Mass Or lumps Cardiovascular: S1 and S2 Present, no Murmur, Peripheral Pulses Present Respiratory: increased respiratory effort, Bilateral Air entry equal and Decreased, positive use of accessory muscle, bilateral rhonchi and basal Crackles, expiratory wheezes Abdomen: Bowel Sound present, Soft and no tenderness, no hernia Skin: no redness, no Rash, no induration Extremities: bilateral Pedal edema, no calf tenderness Neurologic: Grossly no focal neuro deficit. Bilaterally Equal motor strength Generalized weakness.  Labs on Admission:  CBC:  Recent Labs Lab 08/24/16 1053  WBC 10.8*  NEUTROABS 8.2*  HGB 12.2  HCT 36.3  MCV 89.9  PLT 299   Basic Metabolic Panel:  Recent Labs Lab 08/24/16 1053  NA 140  K 3.8  CL 109  CO2 21*  GLUCOSE 127*  BUN 35*  CREATININE 2.04*  CALCIUM 8.8*   GFR: Estimated Creatinine Clearance: 17.5 mL/min (A) (by C-G formula based on SCr of 2.04 mg/dL (H)). Liver Function Tests:  Recent Labs Lab 08/24/16 1053  AST 17  ALT 9*  ALKPHOS 42  BILITOT 0.9  PROT 7.1  ALBUMIN 3.8   No results for input(s): LIPASE, AMYLASE in  the last 168 hours. No results for input(s): AMMONIA in the last 168 hours. Coagulation Profile: No results for input(s): INR, PROTIME in the last 168 hours. Cardiac Enzymes:  Recent Labs Lab 08/24/16 1444  TROPONINI 0.46*   BNP (last 3 results) No results for input(s): PROBNP in the last 8760 hours. HbA1C: No results for input(s): HGBA1C in the last 72 hours. CBG: No results for input(s): GLUCAP in the last 168 hours. Lipid Profile: No results for input(s): CHOL, HDL, LDLCALC, TRIG, CHOLHDL, LDLDIRECT in the last 72 hours. Thyroid Function Tests: No results for input(s): TSH, T4TOTAL, FREET4, T3FREE, THYROIDAB in the last 72 hours. Anemia Panel: No results for input(s): VITAMINB12, FOLATE, FERRITIN, TIBC, IRON, RETICCTPCT in the last 72 hours. Urine analysis:    Component Value Date/Time   COLORURINE YELLOW 08/24/2016 1053   APPEARANCEUR CLOUDY (A) 08/24/2016 1053   LABSPEC 1.011 08/24/2016 1053   PHURINE 5.0 08/24/2016 1053   GLUCOSEU NEGATIVE 08/24/2016 1053   HGBUR SMALL (A) 08/24/2016 1053   BILIRUBINUR NEGATIVE 08/24/2016 1053   KETONESUR NEGATIVE 08/24/2016 1053   PROTEINUR 100 (A) 08/24/2016 1053   UROBILINOGEN 0.2 11/04/2014 1613   NITRITE NEGATIVE 08/24/2016 1053   LEUKOCYTESUR LARGE (A) 08/24/2016 1053    Radiological Exams on Admission: Dg Chest Port 1 View  Result Date: 08/24/2016 CLINICAL DATA:  Patient with worsening shortness of breath. EXAM: PORTABLE CHEST 1 VIEW COMPARISON:  Chest radiograph 03/07/2015. FINDINGS: Monitoring leads overlie the patient. Patient is mildly rotated to the right. Cardiac contours upper limits of normal. Interval development of patchy consolidation within the right mid lung with associated heterogeneous opacities right lower lung. Small right pleural effusion. Left lung is clear. Bilateral shoulder joint degenerative changes. IMPRESSION: Interval development of consolidation predominately within the right mid lung with  heterogeneous right lower lung opacities favored to represent pneumonia in the appropriate clinical setting. Followup PA and lateral chest X-ray is recommended in 3-4 weeks following trial of antibiotic therapy to ensure resolution and exclude underlying malignancy. Small right pleural effusion. Electronically Signed   By: Lovey Newcomer M.D.   On: 08/24/2016 10:53   EKG: Independently reviewed. normal sinus rhythm, nonspecific ST and T waves changes.  Assessment/Plan  1. Acute respiratory failure with hypoxia. Acute on chronic combined diastolic and systolic CHF. Healthcare associated pneumonia. Acute metabolic encephalopathy. Meeting sepsis criteria on admission.  The patient was brought by EMS as she was significantly hypoxic and lethargic. She was placed on BiPAP in ER and was given IV salmeterol. Reportedly her blood pressure was significantly elevated although I do not have numbers. At the time of my evaluation patient has bilateral wheezing and rhonchi, basal crackles. Chest x-ray shows concern for right pneumonia. Patient did have an episode of vomiting. BNP significantly elevated as well as troponin. This findings support a diagnosis of healthcare associated pneumonia with possible aspiration as well as acute on chronic CHF for her presentation. With this I would start the patient on broad-spectrum antibiotics, blood cultures are working been performed. Continue BiPAP as needed continue oxygen as needed. Mucinex and flutter device. Speech therapy consult for swallowing. With bilateral rhonchi I would add IV Solu-Medrol, duo nebs. Monitoring the stepdown unit at present. Patient will be getting IV Lasix 40 mg every 12 hours.  2. Accelerated hypertension. Lethargy. We will be changing the clonidine from tablet 2 patch. Continue IV Lasix. If the blood pressure go up resume. Holding other antihypertensive medication at present in the setting of possible sepsis as well.  3. Elevated  troponin. Likely demand ischemia from hypertension as well as hypoxia. Cardiac cath September 2016 shows severe multivessel disease, EF 35-40% as well. Family has chosen medical management at that time and there is no change in that opinion. Patient remains high risk for any cardiac intervention due to her chronic kidney disease. Conservative management at present. Check echocardiogram. Continue aspirin.  4. Chronic kidney disease stage III-IV. Renal function close to baseline. Monitor while the patient is resuming diuresis.   5. History of CVA. At present close monitor. Continue home regimen.  Nutrition: Nothing by mouth pending speech evaluation. DVT Prophylaxis: subcutaneous Heparin  Advance goals of care discussion: DNR/DNI but the patient/family would like to use BiPAP as needed as well as pressors in case she is hypertensive.  Consults: none  Family Communication: family was present at bedside, at the time of interview.  Opportunity was given to ask question and all questions were answered satisfactorily.  Disposition: Admitted as inpatient, step-down unit. Likely to be discharged to be determined., in 3-4 days.  Author: Berle Mull, MD [facility] Pager: [phone] 08/24/2016  If 7PM-7AM, please contact night-coverage [email or web address] Password [email or web address]

## 2016-08-24 NOTE — ED Notes (Signed)
Approved patient for room 1222. RN is a lunch and will call RN with 20 minute timer when she has returned. Thank you.

## 2016-08-24 NOTE — ED Provider Notes (Signed)
[place] DEPT Provider Note   CSN: [id number] Arrival date & time: 08/24/16  1019     History   Chief Complaint Chief Complaint  Patient presents with  . Shortness of Breath    HPI [name] is a 83 y.o. female history of dementia, reflux, hyperlipidemia, hypertension, previous breast cancer here presenting with altered mental status, fever, hypoxia. Patient is from friend's home nursing home. Family visited her about 4 days ago and she is at her baseline. At baseline, patient has trouble walking and has severe dementia. She was noted to be febrile 102, tachycardic, hypertensive 220/110, and hypoxic 82% on RA in the nursing home today. EMS was called and she was noted to be 88% on 2 L nasal cannula and was put on BiPAP. Per EMS, patient appears fluid overloaded and may have some wheezing so was also given Solu-Medrol en route. Patient demented and unable to give much history. Family also confirmed that she is a DO NOT RESUSCITATE.  The history is provided by the EMS personnel, a relative and medical records.  Level V caveat- dementia, condition of patient   Past Medical History:  Diagnosis Date  . Arthritis    Osteoarthritis  . Cancer of left breast [facility]) August 2016  . Cerebral atherosclerosis 12/19/2012  . Cerebral atrophy 12/19/2012  . Cerebral ischemia 12/19/2012   Microvascular   . CKD (chronic kidney disease) stage 3, GFR 30-59 ml/min 11/03/2014  . Cough   . Dementia   . Diverticulosis of colon (without mention of hemorrhage)   . Edema   . Flat feet   . GERD (gastroesophageal reflux disease)   . Glaucoma   . Heart murmur   . Hyperlipidemia   . Hypertension   . Hypertonicity of bladder   . Ischemic cardiomyopathy 11/04/2014  . Lumbago   . Memory change 03/28/2016  . Osteoarthrosis, unspecified whether generalized or localized, unspecified site   . Other malaise and fatigue   . Pain in joint, pelvic region and thigh   . Pain in joint, shoulder region   .  Personal history of fall   . RLQ abdominal pain 01/17/2011  . Senile osteoporosis   . Small bowel obstruction ([place])   . STEMI (ST elevation myocardial infarction) ([place]) 11/04/2014  . Unstable gait 11/04/2014  . Urine, incontinence, stress female 09/18/2014    Patient Active Problem List   Diagnosis Date Noted  . HCAP (healthcare-associated pneumonia) 08/24/2016  . Fall 04/03/2016  . Memory change 03/28/2016  . UTI (urinary tract infection) 12/15/2014  . CKD (chronic kidney disease) stage 3, GFR 30-59 ml/min 11/03/2014  . Breast cancer of upper-outer quadrant of left female breast ([place]) 10/17/2014  . Urine, incontinence, stress female 09/18/2014  . Right shoulder pain 03/21/2014  . CVA (cerebral vascular accident) ([place]) 03/14/2014  . Wedge compression fracture of T6 vertebra ([place]) 03/02/2014  . Right knee pain 02/06/2014  . Dyspnea 01/16/2014  . Anxiety state 01/16/2014  . Cerebral ischemia 12/19/2012  . Cerebral atrophy 12/19/2012  . Cerebral atherosclerosis 12/19/2012  . Anemia 12/19/2012  . Chronic diastolic congestive heart failure ([place]) 09/10/2012  . Flat feet   . Lumbago   . Hypertension   . Pain in joint, shoulder region   . Hypertonicity of bladder   . Edema   . Hyperlipidemia   . GERD (gastroesophageal reflux disease)     Past Surgical History:  Procedure Laterality Date  . BREAST LUMPECTOMY Bilateral   . BREAST LUMPECTOMY WITH RADIOACTIVE SEED LOCALIZATION  Left 03/15/2015   Procedure: LEFT BREAST LUMPECTOMY WITH RADIOACTIVE SEED LOCALIZATION;  Surgeon: Fanny Skates, MD;  Location: [place];  Service: General;  Laterality: Left;  . CARDIAC CATHETERIZATION N/A 11/08/2014   Procedure: Left Heart Cath and Coronary Angiography;  Surgeon: [name], MD;  Location: [place] [facility];  Service: Cardiovascular;  Laterality: N/A;  . COLON SURGERY  02/1996   Colectomy, partial for diverticular bleed 90%  . EYE SURGERY    . JOINT REPLACEMENT Left 12/2000   Left knee;  Dr. Wynelle Link  . LAPAROSCOPIC LYSIS OF ADHESIONS  11/15/10   Exploratory  . TONSILLECTOMY      OB History    No data available       Home Medications    Prior to Admission medications   Medication Sig Start Date End Date Taking? Authorizing Provider  acetaminophen (TYLENOL) 325 MG tablet Take 650 mg by mouth 2 (two) times daily. Take 2 tablets every 6 hours as needed for mild pain or fever    [provider]  alum & mag hydroxide-simeth (MAALOX/MYLANTA) 200-200-20 MG/5ML suspension Take by mouth. 84ml every 4 hours as needed for indigestion    [provider]  atorvastatin (LIPITOR) 20 MG tablet Take one tablet by mouth once daily for cholesterol 04/21/14   Reed, Tiffany L, DO  BETIMOL 0.5 % ophthalmic solution Place 1 drop into both eyes every morning.  09/23/10   [provider]  carvedilol (COREG) 6.25 MG tablet Take 6.25 mg by mouth 2 (two) times daily.     [provider]  cloNIDine (CATAPRES) 0.1 MG tablet Take 0.1 mg by mouth 2 (two) times daily.    [provider]  Coenzyme Q10 50 MG TABS Take one tablet by mouth at bedtime    [provider]  famotidine (PEPCID) 20 MG tablet Take 20 mg by mouth daily.    [provider]  furosemide (LASIX) 20 MG tablet Take 20 mg by mouth daily.    [provider]  Hypromellose (ARTIFICIAL TEARS OP) Apply 2 drops to eye 2 (two) times daily. Both eyes    [provider]  isosorbide mononitrate (IMDUR) 60 MG 24 hr tablet Take 60 mg by mouth at bedtime.     [provider]  KLOR-CON 10 10 MEQ tablet Take 10 mEq by mouth daily. 02/06/15   [provider]  latanoprost (XALATAN) 0.005 % ophthalmic solution Place 1 drop into both eyes at bedtime.     [provider]  mirtazapine (REMERON) 7.5 MG tablet TAKE 1 TABLET (7.5 MG TOTAL) BY MOUTH AT BEDTIME. NIGHTLY FOR APPETITE 03/15/14   Estill Dooms, MD  vitamin B-12 (CYANOCOBALAMIN) 1000 MCG tablet Take  1 tablet by mouth twice daily On Mondays,Wednesdays and Fridays    [provider]    Family History Family History  Problem Relation Age of Onset  . Cancer Father 25       colon  . Cancer Maternal Aunt        breast cancer   . Cancer Cousin        breast cancer     Social History Social History  Substance Use Topics  . Smoking status: Former Smoker    Packs/day: 0.50    Years: 30.00    Types: Cigarettes    Quit date: 12/16/1980  . Smokeless tobacco: Never Used  . Alcohol use 0.6 oz/week    1 Glasses of wine per week     Comment:  occasional glass of wine     Allergies   Sulfa antibiotics   Review of Systems Review of Systems  Respiratory: Positive for shortness of breath.   All other systems reviewed and are negative.    Physical Exam Updated Vital Signs BP 116/77   Pulse 91   Temp (!) 100.7 F (38.2 C) (Rectal)   Resp 16   Ht 5\' 4"  (1.626 m)   Wt 65.8 kg (145 lb)   SpO2 98%   BMI 24.89 kg/m   Physical Exam  Constitutional:  Ill appearing, tachypneic, altered   HENT:  Head: Normocephalic.  MM dry, bipap mask on face   Eyes: Pupils are equal, round, and reactive to light.  Neck: Normal range of motion.  Cardiovascular:  Tachycardic   Pulmonary/Chest:  Rhonchi bilateral bases   Abdominal: Soft. Bowel sounds are normal.  Musculoskeletal:  1+ edema bilateral legs   Neurological:  Altered, moving all extremities. No obvious facial droop. Unable to follow commands   Skin: Skin is warm.  Psychiatric:  Unable   Nursing note and vitals reviewed.    ED Treatments / Results  Labs (all labs ordered are listed, but only abnormal results are displayed) Labs Reviewed  COMPREHENSIVE METABOLIC PANEL - Abnormal; Notable for the following:       Result Value   CO2 21 (*)    Glucose, Bld 127 (*)    BUN 35 (*)    Creatinine, Ser 2.04 (*)    Calcium 8.8 (*)    ALT 9 (*)    GFR calc non Af Amer 20 (*)    GFR calc Af Amer 24 (*)    All other  components within normal limits  CBC WITH DIFFERENTIAL/PLATELET - Abnormal; Notable for the following:    WBC 10.8 (*)    Neutro Abs 8.2 (*)    Monocytes Absolute 1.7 (*)    All other components within normal limits  URINALYSIS, ROUTINE W REFLEX MICROSCOPIC - Abnormal; Notable for the following:    APPearance CLOUDY (*)    Hgb urine dipstick SMALL (*)    Protein, ur 100 (*)    Leukocytes, UA LARGE (*)    Bacteria, UA MANY (*)    Squamous Epithelial / LPF 0-5 (*)    All other components within normal limits  BRAIN NATRIURETIC PEPTIDE - Abnormal; Notable for the following:    B Natriuretic Peptide 689.5 (*)    All other components within normal limits  BLOOD GAS, ARTERIAL - Abnormal; Notable for the following:    Bicarbonate 19.2 (*)    Acid-base deficit 4.5 (*)    All other components within normal limits  I-STAT TROPOININ, ED - Abnormal; Notable for the following:    Troponin i, poc 0.19 (*)    All other components within normal limits  CULTURE, BLOOD (ROUTINE X 2)  CULTURE, BLOOD (ROUTINE X 2)  URINE CULTURE  I-STAT CG4 LACTIC ACID, ED    EKG  EKG Interpretation  Date/Time:  Saturday August 24 2016 10:27:25 EDT Ventricular Rate:  103 PR Interval:    QRS Duration: 83 QT Interval:  344 QTC Calculation: 451 R Axis:   63 Text Interpretation:  Sinus tachycardia Atrial premature complex Consider left ventricular hypertrophy rate faster than previous. Prolonged QT from previous resolved  Confirmed by Wandra Arthurs [phone]) on 08/24/2016 10:36:21 AM Also confirmed by Wandra Arthurs [phone]), editor Drema Pry [phone])  on 08/24/2016 10:58:55 AM       Radiology Dg  Chest Port 1 View  Result Date: 08/24/2016 CLINICAL DATA:  Patient with worsening shortness of breath. EXAM: PORTABLE CHEST 1 VIEW COMPARISON:  Chest radiograph 03/07/2015. FINDINGS: Monitoring leads overlie the patient. Patient is mildly rotated to the right. Cardiac contours upper limits of normal. Interval  development of patchy consolidation within the right mid lung with associated heterogeneous opacities right lower lung. Small right pleural effusion. Left lung is clear. Bilateral shoulder joint degenerative changes. IMPRESSION: Interval development of consolidation predominately within the right mid lung with heterogeneous right lower lung opacities favored to represent pneumonia in the appropriate clinical setting. Followup PA and lateral chest X-ray is recommended in 3-4 weeks following trial of antibiotic therapy to ensure resolution and exclude underlying malignancy. Small right pleural effusion. Electronically Signed   By: Lovey Newcomer M.D.   On: 08/24/2016 10:53    Procedures Procedures (including critical care time)  CRITICAL CARE Performed by: Wandra Arthurs   Total critical care time: 30 minutes  Critical care time was exclusive of separately billable procedures and treating other patients.  Critical care was necessary to treat or prevent imminent or life-threatening deterioration.  Critical care was time spent personally by me on the following activities: development of treatment plan with patient and/or surrogate as well as nursing, discussions with consultants, evaluation of patient's response to treatment, examination of patient, obtaining history from patient or surrogate, ordering and performing treatments and interventions, ordering and review of laboratory studies, ordering and review of radiographic studies, pulse oximetry and re-evaluation of patient's condition.   Medications Ordered in ED Medications  vancomycin (VANCOCIN) IVPB 1000 mg/200 mL premix (1,000 mg Intravenous New Bag/Given 08/24/16 1124)  vancomycin (VANCOCIN) IVPB 1000 mg/200 mL premix (not administered)  ceFEPIme (MAXIPIME) 500 mg in dextrose 5 % 50 mL IVPB (not administered)  ceFEPIme (MAXIPIME) 2 g in dextrose 5 % 50 mL IVPB (2 g Intravenous New Bag/Given 08/24/16 1124)  acetaminophen (TYLENOL) suppository 650  mg (650 mg Rectal Given 08/24/16 1111)  sodium chloride 0.9 % bolus 1,000 mL (1,000 mLs Intravenous New Bag/Given 08/24/16 1040)     Initial Impression / Assessment and Plan / ED Course  I have reviewed the triage vital signs and the nursing notes.  Pertinent labs & imaging results that were available during my care of the patient were reviewed by me and considered in my medical decision making (see chart for details).     [name] is a 83 y.o. female here with fever, tachycardia, AMS. Concerned for possible pneumonia. Has hx of CHF as well and concern for possible fluid overload. Will do sepsis workup but hold 30 cc/kg bolus due to hypoxia, possible CHF. I confirmed code status with family and she is a DNR. Will continue bipap and get ABG.    12:22 PM ABG on bipap showed pH 7.3. CO2 34. Bicarb 19. CXR showed pneumonia. Trop mildly elevated likely demand ischemia. Given IV abx. Will admit to stepdown for HCAP, elevated trop.   Final Clinical Impressions(s) / ED Diagnoses   Final diagnoses:  None    New Prescriptions New Prescriptions   No medications on file     Drenda Freeze, MD 08/24/16 1222

## 2016-08-24 NOTE — Progress Notes (Signed)
Patient placed on 4 L [place]; O2 sat at this time is 97%. BiPAP on standby. Patient now more alert and no complaints of SOB or increased WOB. MD (Dr. Darl Householder) and bedside RN aware. RT will continue to monitor patient.

## 2016-08-24 NOTE — Progress Notes (Signed)
Pt seen, resting comfortably, no respiratory distress noted or voiced by pt at this time.  HR78, rr19, spo2 96% on 2.5lnc, bipap not indicated at this time.

## 2016-08-24 NOTE — ED Triage Notes (Addendum)
Pt from [facility], EMS reports staff stated she was having labored breathing yesterday, today continues therefore sent to hospital. Pt on C Pap on arrival with tired appearance, head bobbing with resp. # 20 left FA by EMS. DNR with paperwork.

## 2016-08-24 NOTE — ED Notes (Signed)
She is much more alert than when she arrived. She recognizes her visitors (daughter and grandson). She remains comfortably on Bi-pap. After blood/cultures and urine culture obtained; antibiotic given.

## 2016-08-24 NOTE — Evaluation (Signed)
Clinical/Bedside Swallow Evaluation Patient Details  Name: [name] MRN: [id number] Date of Birth: October 15, 1925  Today's Date: 08/24/2016 Time: SLP Start Time (ACUTE ONLY): 1340 SLP Stop Time (ACUTE ONLY): 1355 SLP Time Calculation (min) (ACUTE ONLY): 15 min  Past Medical History:  Past Medical History:  Diagnosis Date  . Arthritis    Osteoarthritis  . Cancer of left breast [facility]) August 2016  . Cerebral atherosclerosis 12/19/2012  . Cerebral atrophy 12/19/2012  . Cerebral ischemia 12/19/2012   Microvascular   . CKD (chronic kidney disease) stage 3, GFR 30-59 ml/min 11/03/2014  . Cough   . Dementia   . Diverticulosis of colon (without mention of hemorrhage)   . Edema   . Flat feet   . GERD (gastroesophageal reflux disease)   . Glaucoma   . Heart murmur   . Hyperlipidemia   . Hypertension   . Hypertonicity of bladder   . Ischemic cardiomyopathy 11/04/2014  . Lumbago   . Memory change 03/28/2016  . Osteoarthrosis, unspecified whether generalized or localized, unspecified site   . Other malaise and fatigue   . Pain in joint, pelvic region and thigh   . Pain in joint, shoulder region   . Personal history of fall   . RLQ abdominal pain 01/17/2011  . Senile osteoporosis   . Small bowel obstruction ([place])   . STEMI (ST elevation myocardial infarction) ([place]) 11/04/2014  . Unstable gait 11/04/2014  . Urine, incontinence, stress female 09/18/2014   Past Surgical History:  Past Surgical History:  Procedure Laterality Date  . BREAST LUMPECTOMY Bilateral   . BREAST LUMPECTOMY WITH RADIOACTIVE SEED LOCALIZATION Left 03/15/2015   Procedure: LEFT BREAST LUMPECTOMY WITH RADIOACTIVE SEED LOCALIZATION;  Surgeon: Fanny Skates, MD;  Location: [place];  Service: General;  Laterality: Left;  . CARDIAC CATHETERIZATION N/A 11/08/2014   Procedure: Left Heart Cath and Coronary Angiography;  Surgeon: [name], MD;  Location: [place] [facility];  Service: Cardiovascular;  Laterality: N/A;  . COLON  SURGERY  02/1996   Colectomy, partial for diverticular bleed 90%  . EYE SURGERY    . JOINT REPLACEMENT Left 12/2000   Left knee; Dr. Wynelle Link  . LAPAROSCOPIC LYSIS OF ADHESIONS  11/15/10   Exploratory  . TONSILLECTOMY     HPI:  [name] is a 83 y.o. female history of dementia, reflux, hyperlipidemia, hypertension, previous breast cancer here presenting with altered mental status, fever, hypoxia.  Found to have right sided PNA.    Assessment / Plan / Recommendation Clinical Impression  Patient presents with a functional oropharyngeal swallow. Patient with appropriate oral transit of bolus, consistent initiation of swallow, and no overt s/s of aspiration post swallow. Appears safe to resume a regular diet however in light of acute right sided PNA, advanced age, and dementia, recommend instrumental testing to r/o silent aspiration/dysphagia.  SLP Visit Diagnosis: Dysphagia, unspecified (R13.10)    Aspiration Risk       Diet Recommendation Regular;Thin liquid   Liquid Administration via: Cup;Straw Medication Administration: Whole meds with liquid Supervision: Patient able to self feed;Full supervision/cueing for compensatory strategies Compensations: Slow rate;Small sips/bites Postural Changes: Seated upright at 90 degrees    Other  Recommendations Oral Care Recommendations: Oral care BID   Follow up Recommendations  (TBD)           Swallow Study   General HPI: [name] is a 83 y.o. female history of dementia, reflux, hyperlipidemia, hypertension, previous breast cancer here presenting with altered mental status, fever,  hypoxia.  Found to have right sided PNA.  Type of Study: Bedside Swallow Evaluation Previous Swallow Assessment: none noted Diet Prior to this Study: NPO Temperature Spikes Noted: No Respiratory Status: Nasal cannula History of Recent Intubation: No Behavior/Cognition: Alert;Cooperative;Pleasant mood;Confused Oral Cavity Assessment: Within Functional  Limits Oral Care Completed by SLP: Recent completion by staff Oral Cavity - Dentition: Adequate natural dentition Vision: Functional for self-feeding Self-Feeding Abilities: Able to feed self Patient Positioning: Upright in bed Baseline Vocal Quality: Breathy;Hoarse Volitional Cough: Strong Volitional Swallow: Able to elicit    Oral/Motor/Sensory Function Overall Oral Motor/Sensory Function: Within functional limits   Ice Chips Ice chips: Not tested   Thin Liquid Thin Liquid: Within functional limits Presentation: Cup;Self Fed;Straw    Nectar Thick Nectar Thick Liquid: Not tested   Honey Thick Honey Thick Liquid: Not tested   Puree Puree: Within functional limits Presentation: Spoon   Solid   Shawntae Lowy MA, CCC-SLP [phone]  Solid: Within functional limits Presentation: Self Fed        Euriah Matlack Meryl 08/24/2016,3:57 PM

## 2016-08-25 ENCOUNTER — Inpatient Hospital Stay (HOSPITAL_COMMUNITY): Payer: Medicare Other

## 2016-08-25 DIAGNOSIS — I10 Essential (primary) hypertension: Secondary | ICD-10-CM

## 2016-08-25 DIAGNOSIS — J9601 Acute respiratory failure with hypoxia: Secondary | ICD-10-CM

## 2016-08-25 DIAGNOSIS — G9341 Metabolic encephalopathy: Secondary | ICD-10-CM

## 2016-08-25 DIAGNOSIS — R748 Abnormal levels of other serum enzymes: Secondary | ICD-10-CM

## 2016-08-25 LAB — CBC
HCT: 33 % — ABNORMAL LOW (ref 36.0–46.0)
Hemoglobin: 11.1 g/dL — ABNORMAL LOW (ref 12.0–15.0)
MCH: 29.7 pg (ref 26.0–34.0)
MCHC: 33.6 g/dL (ref 30.0–36.0)
MCV: 88.2 fL (ref 78.0–100.0)
PLATELETS: 190 10*3/uL (ref 150–400)
RBC: 3.74 MIL/uL — AB (ref 3.87–5.11)
RDW: 14.1 % (ref 11.5–15.5)
WBC: 19.8 10*3/uL — AB (ref 4.0–10.5)

## 2016-08-25 LAB — BASIC METABOLIC PANEL
Anion gap: 10 (ref 5–15)
BUN: 41 mg/dL — ABNORMAL HIGH (ref 6–20)
CHLORIDE: 106 mmol/L (ref 101–111)
CO2: 22 mmol/L (ref 22–32)
Calcium: 8.5 mg/dL — ABNORMAL LOW (ref 8.9–10.3)
Creatinine, Ser: 2.2 mg/dL — ABNORMAL HIGH (ref 0.44–1.00)
GFR, EST AFRICAN AMERICAN: 22 mL/min — AB (ref >60)
GFR, EST NON AFRICAN AMERICAN: 19 mL/min — AB (ref >60)
Glucose, Bld: 161 mg/dL — ABNORMAL HIGH (ref 65–99)
Potassium: 3.4 mmol/L — ABNORMAL LOW (ref 3.5–5.1)
SODIUM: 138 mmol/L (ref 135–145)

## 2016-08-25 LAB — ECHOCARDIOGRAM COMPLETE
HEIGHTINCHES: 66 in
Weight: 2158.74 oz

## 2016-08-25 LAB — MAGNESIUM: MAGNESIUM: 1.9 mg/dL (ref 1.7–2.4)

## 2016-08-25 LAB — TROPONIN I: Troponin I: 0.22 ng/mL (ref <0.03)

## 2016-08-25 MED ORDER — POTASSIUM CHLORIDE CRYS ER 20 MEQ PO TBCR
40.0000 meq | EXTENDED_RELEASE_TABLET | Freq: Once | ORAL | Status: AC
  Administered 2016-08-25: 40 meq via ORAL
  Filled 2016-08-25: qty 2

## 2016-08-25 NOTE — Progress Notes (Signed)
Echocardiogram 2D Echocardiogram has been performed.  [name] 08/25/2016, 11:41 AM

## 2016-08-25 NOTE — Progress Notes (Signed)
Nutrition Brief Note  RD consulted via COPD gold protocol.  Pt with stable weight. Currently consuming 75-100% of meals.  Wt Readings from Last 15 Encounters:  08/25/16 134 lb 14.7 oz (61.2 kg)  08/13/16 136 lb 4.8 oz (61.8 kg)  07/29/16 136 lb 4.8 oz (61.8 kg)  06/24/16 132 lb (59.9 kg)  06/05/16 131 lb 1.6 oz (59.5 kg)  05/06/16 132 lb 6.4 oz (60.1 kg)  04/18/16 133 lb 3.2 oz (60.4 kg)  04/03/16 132 lb 6.4 oz (60.1 kg)  04/02/16 133 lb (60.3 kg)  03/28/16 133 lb (60.3 kg)  03/04/16 133 lb (60.3 kg)  02/01/16 131 lb 3.2 oz (59.5 kg)  01/03/16 129 lb (58.5 kg)  12/04/15 129 lb (58.5 kg)  10/26/15 130 lb 6.4 oz (59.1 kg)    Body mass index is 21.78 kg/m. Patient meets criteria for normal based on current BMI.   Current diet order is Heart Healthy, patient is consuming approximately 75-100% of meals at this time. Labs and medications reviewed.   No nutrition interventions warranted at this time. If nutrition issues arise, please consult RD.   Beth Bibles, MS, RD, LDN Pager: [phone] After Hours Pager: [phone]

## 2016-08-25 NOTE — Progress Notes (Signed)
PROGRESS NOTE    [name]  FWY:[id number] DOB: 1925/05/25 DOA: 08/24/2016 PCP: [name], MD    Brief Narrative:  83 y.o. female with Past medical history of coronary artery disease, medically managed, chronic kidney disease stage III, CVA, breast cancer, dementia, dyslipidemia, hypertension, chronic systolic CHF. Patient was brought in from friend's home for lethargy as well as increased shortness of breath. Reportedly she also had elevated blood pressure when the EMS arrived to see her. Patient was given Solu-Medrol IV and was placed on C Pap and brought to the hospital. Daughter was at bedside mentioned that patient did not have any other acute complaint on last Tuesday when she saw her last but there was some increased lethargy. Daughter also thinks that she may have some medication changed at the nursing home recently but unaware of the names or changes. Further history is significantly limited since patient is unable to answered what happened but at the time of my evaluation denies having any complaints of chest pain, abdominal pain, nausea, vomiting, increased shortness of breath  Assessment & Plan:   Principal Problem:   Acute respiratory failure with hypoxia ([place]) Active Problems:   Hypertension   Hyperlipidemia   GERD (gastroesophageal reflux disease)   Elevated troponin   CVA (cerebral vascular accident) ([place])   Breast cancer of upper-outer quadrant of left female breast ([place])   CKD (chronic kidney disease) stage 3, GFR 30-59 ml/min   HCAP (healthcare-associated pneumonia)   Accelerated hypertension   Acute on chronic combined systolic and diastolic CHF (congestive heart failure) ([place])   Acute metabolic encephalopathy  1. Acute respiratory failure with hypoxia. Acute on chronic combined diastolic and systolic CHF. Healthcare associated pneumonia. Acute metabolic encephalopathy. Meeting sepsis criteria on admission. -Patient initially presented hypoxic and  lethargic. Patient required BiPAP initially -Chest x-ray reviewed, initial concerns for right-sided pneumonia -Patient does have history of underlying systolic heart failure - patient was given neb treatments, IV Solu-Medrol, IV steroids overnight with dramatic improvement by the following morning. -We'll continue patient on scheduled IV Lasix. -Continue patient on IV antibiotics -Patient is now on minimal O2 support.  2. Accelerated hypertension. Lethargy. -Have transitioned clonidine from tablet to patch. -Continue Lasix per above -Continue monitor blood pressure.  3. Elevated troponin. -Likely demand ischemia from hypertension as well as hypoxia. -Cardiac cath September 2016 shows severe multivessel disease, EF 35-40% as well. -Family has chosen medical management at that time and there is no change in that opinion. -2-D echocardiogram ordered. -Troponin trends reviewed, trending down -Patient presently denies chest pain  4. Chronic kidney disease stage III-IV. -Renal function close to baseline at time of presentation. -Renal function has worsened slightly this morning, likely secondary to aggressive diuresis overnight -Repeat basic metabolic panel  5. History of CVA. -Appears be stable at this time -Continue to monitor closely.  DVT prophylaxis: Heparin subcutaneous Code Status: Partial code, DO NOT RESUSCITATE, okay for BiPAP Family Communication: Patient, family at bedside Disposition Plan: Possible discharge in 24-48 hours  Consultants:     Procedures:     Antimicrobials: Anti-infectives    Start     Dose/Rate Route Frequency Ordered Stop   08/26/16 1100  vancomycin (VANCOCIN) IVPB 1000 mg/200 mL premix     1,000 mg 200 mL/hr over 60 Minutes Intravenous Every 48 hours 08/24/16 1201     08/25/16 1100  ceFEPIme (MAXIPIME) 500 mg in dextrose 5 % 50 mL IVPB     500 mg 100 mL/hr over 30 Minutes Intravenous  Every 24 hours 08/24/16 1201     08/24/16 1045   ceFEPIme (MAXIPIME) 2 g in dextrose 5 % 50 mL IVPB     2 g 100 mL/hr over 30 Minutes Intravenous  Once 08/24/16 1033 08/24/16 1240   08/24/16 1045  vancomycin (VANCOCIN) IVPB 1000 mg/200 mL premix     1,000 mg 200 mL/hr over 60 Minutes Intravenous  Once 08/24/16 1033 08/24/16 1240       Subjective: Reports feeling much better today  Objective: Vitals:   08/25/16 0800 08/25/16 0825 08/25/16 1000 08/25/16 1100  BP: (!) 151/72  (!) 131/58 (!) 151/78  Pulse: 91  82 93  Resp: 14  (!) 21 17  Temp: 99.9 F (37.7 C)  100 F (37.8 C) 99.9 F (37.7 C)  TempSrc:      SpO2: 95% 95% 96% 93%  Weight:      Height:        Intake/Output Summary (Last 24 hours) at 08/25/16 1201 Last data filed at 08/25/16 1107  Gross per 24 hour  Intake             1770 ml  Output             2245 ml  Net             -475 ml   Filed Weights   08/24/16 1122 08/24/16 1430 08/25/16 0500  Weight: 65.8 kg (145 lb) 61.5 kg (135 lb 9.3 oz) 61.2 kg (134 lb 14.7 oz)    Examination:  General exam: Appears calm and comfortable  Respiratory system: Clear to auscultation. Respiratory effort normal. Cardiovascular system: S1 & S2 heard, RRR.  Gastrointestinal system: Abdomen is nondistended, soft and nontender. No organomegaly or masses felt. Normal bowel sounds heard. Central nervous system: Alert and oriented. No focal neurological deficits. Extremities: Symmetric 5 x 5 power. Skin: No rashes, lesions Psychiatry: Judgement and insight appear normal. Mood & affect appropriate.   Data Reviewed: I have personally reviewed following labs and imaging studies  CBC:  Recent Labs Lab 08/24/16 1053 08/25/16 0105  WBC 10.8* 19.8*  NEUTROABS 8.2*  --   HGB 12.2 11.1*  HCT 36.3 33.0*  MCV 89.9 88.2  PLT 186 381   Basic Metabolic Panel:  Recent Labs Lab 08/24/16 1053 08/25/16 0105  NA 140 138  K 3.8 3.4*  CL 109 106  CO2 21* 22  GLUCOSE 127* 161*  BUN 35* 41*  CREATININE 2.04* 2.20*  CALCIUM 8.8*  8.5*  MG  --  1.9   GFR: Estimated Creatinine Clearance: 16.2 mL/min (A) (by C-G formula based on SCr of 2.2 mg/dL (H)). Liver Function Tests:  Recent Labs Lab 08/24/16 1053  AST 17  ALT 9*  ALKPHOS 42  BILITOT 0.9  PROT 7.1  ALBUMIN 3.8   No results for input(s): LIPASE, AMYLASE in the last 168 hours. No results for input(s): AMMONIA in the last 168 hours. Coagulation Profile: No results for input(s): INR, PROTIME in the last 168 hours. Cardiac Enzymes:  Recent Labs Lab 08/24/16 1444 08/24/16 2023 08/25/16 0105  TROPONINI 0.46* 0.31* 0.22*   BNP (last 3 results) No results for input(s): PROBNP in the last 8760 hours. HbA1C: No results for input(s): HGBA1C in the last 72 hours. CBG: No results for input(s): GLUCAP in the last 168 hours. Lipid Profile: No results for input(s): CHOL, HDL, LDLCALC, TRIG, CHOLHDL, LDLDIRECT in the last 72 hours. Thyroid Function Tests: No results for input(s): TSH, T4TOTAL, FREET4, T3FREE, THYROIDAB  in the last 72 hours. Anemia Panel: No results for input(s): VITAMINB12, FOLATE, FERRITIN, TIBC, IRON, RETICCTPCT in the last 72 hours. Sepsis Labs:  Recent Labs Lab 08/24/16 1106  LATICACIDVEN 1.38    Recent Results (from the past 240 hour(s))  Blood Culture (routine x 2)     Status: None (Preliminary result)   Collection Time: 08/24/16 10:53 AM  Result Value Ref Range Status   Specimen Description BLOOD RIGHT ANTECUBITAL  Final   Special Requests   Final    BOTTLES DRAWN AEROBIC AND ANAEROBIC Blood Culture adequate volume   Culture   Final    NO GROWTH < 24 HOURS Performed at [facility], [address] [place], [place] [address]    Report Status PENDING  Incomplete  Urine culture     Status: Abnormal (Preliminary result)   Collection Time: 08/24/16 10:53 AM  Result Value Ref Range Status   Specimen Description URINE, RANDOM  Final   Special Requests NONE  Final   Culture >=100,000 COLONIES/mL GRAM NEGATIVE RODS (A)   Final   Report Status PENDING  Incomplete  Blood Culture (routine x 2)     Status: None (Preliminary result)   Collection Time: 08/24/16 11:09 AM  Result Value Ref Range Status   Specimen Description BLOOD BLOOD RIGHT FOREARM  Final   Special Requests   Final    BOTTLES DRAWN AEROBIC AND ANAEROBIC Blood Culture adequate volume   Culture   Final    NO GROWTH < 24 HOURS Performed at [facility], [place] [address], [place], [place] [address]    Report Status PENDING  Incomplete  MRSA PCR Screening     Status: None   Collection Time: 08/24/16  2:15 PM  Result Value Ref Range Status   MRSA by PCR NEGATIVE NEGATIVE Final    Comment:        The GeneXpert MRSA Assay (FDA approved for NASAL specimens only), is one component of a comprehensive MRSA colonization surveillance program. It is not intended to diagnose MRSA infection nor to guide or monitor treatment for MRSA infections.      Radiology Studies: Dg Chest Port 1 View  Result Date: 08/24/2016 CLINICAL DATA:  Patient with worsening shortness of breath. EXAM: PORTABLE CHEST 1 VIEW COMPARISON:  Chest radiograph 03/07/2015. FINDINGS: Monitoring leads overlie the patient. Patient is mildly rotated to the right. Cardiac contours upper limits of normal. Interval development of patchy consolidation within the right mid lung with associated heterogeneous opacities right lower lung. Small right pleural effusion. Left lung is clear. Bilateral shoulder joint degenerative changes. IMPRESSION: Interval development of consolidation predominately within the right mid lung with heterogeneous right lower lung opacities favored to represent pneumonia in the appropriate clinical setting. Followup PA and lateral chest X-ray is recommended in 3-4 weeks following trial of antibiotic therapy to ensure resolution and exclude underlying malignancy. Small right pleural effusion. Electronically Signed   By: Lovey Newcomer M.D.   On: 08/24/2016 10:53     Scheduled Meds: . carvedilol  6.25 mg Oral BID WC  . cloNIDine  0.1 mg Transdermal Weekly  . furosemide  40 mg Intravenous Daily  . heparin  5,000 Units Subcutaneous Q8H  . ipratropium-albuterol  3 mL Nebulization QID  . isosorbide mononitrate  60 mg Oral QHS  . latanoprost  1 drop Both Eyes QHS  . predniSONE  40 mg Oral Q supper  . sodium chloride flush  3 mL Intravenous Q12H   Continuous Infusions: .  sodium chloride    . ceFEPime (MAXIPIME) IV 500 mg (08/25/16 1107)  . [START ON 08/26/2016] vancomycin       LOS: 1 day   Cornie Mccomber, Orpah Melter, MD [facility] Pager [phone]  If 7PM-7AM, please contact night-coverage [email or web address] Password [email or web address] 08/25/2016, 12:01 PM

## 2016-08-25 NOTE — Progress Notes (Signed)
Modified Barium Swallow Progress Note  Patient Details  Name: [name] MRN: [id number] Date of Birth: 1926-01-31  Today's Date: 08/25/2016  Modified Barium Swallow completed.  Full report located under Chart Review in the Imaging Section.  Brief recommendations include the following:  Clinical Impression  Pt demonstrates normal oropharyngeal swallow function for age. Mild premature spillage of solids to the valleculae while masticating. No penetration or aspiration. No SLP f/u needed will sign off.    Swallow Evaluation Recommendations       SLP Diet Recommendations: Regular solids;Thin liquid   Liquid Administration via: Cup;Straw   Medication Administration: Whole meds with liquid   Supervision: Patient able to self feed       Postural Changes: Seated upright at 90 degrees   Oral Care Recommendations: Oral care BID       Herbie [place], MA CCC-SLP [phone]  [name], [name] 08/25/2016,1:59 PM

## 2016-08-25 NOTE — Progress Notes (Signed)
Assumed care of patient. Agree with previous RN assessment. Patient denies pain, in no acute distress

## 2016-08-25 NOTE — Evaluation (Signed)
Physical Therapy Evaluation Patient Details Name: [name] MRN: [id number] DOB: [birth year] Today's Date: 08/25/2016   History of Present Illness  Pt admitted from [facility] with acute respiratory failure with hypoxia, CHF, sepsis and metabolic encephalopathy.  Pt wtih hx of CAD, STEMI, cerebral ischemia, dementia, and ischemic cardiomyopathy.  Clinical Impression  Pt admitted as above and presenting with functional mobility limitations 2* generalized weakness, balance deficits and dementia related cognition.  Pt would benefit from return to previous living arrangement at [facility].    Follow Up Recommendations SNF    Equipment Recommendations  None recommended by PT    Recommendations for Other Services       Precautions / Restrictions Precautions Precautions: Fall Restrictions Weight Bearing Restrictions: No      Mobility  Bed Mobility Overal bed mobility: Modified Independent             General bed mobility comments: Pt to/from EOB with increased time  Transfers Overall transfer level: Needs assistance Equipment used: Rolling walker (2 wheeled) Transfers: Sit to/from Stand Sit to Stand: Min assist         General transfer comment: cues for safe transition position and use of UEs to self assist.  Physical assist to bring wt up and fwd and to balance in initial standing  Ambulation/Gait Ambulation/Gait assistance: Min assist Ambulation Distance (Feet): 50 Feet Assistive device: Rolling walker (2 wheeled) Gait Pattern/deviations: Step-through pattern;Decreased step length - right;Decreased step length - left;Shuffle;Trunk flexed Gait velocity: decr Gait velocity interpretation: Below normal speed for age/gender General Gait Details: cues for posture, position from RW and safety awareness  Stairs            Wheelchair Mobility    Modified Rankin (Stroke Patients Only)       Balance Overall balance assessment: Needs  assistance Sitting-balance support: No upper extremity supported;Feet supported Sitting balance-Leahy Scale: Good     Standing balance support: Bilateral upper extremity supported Standing balance-Leahy Scale: Poor                               Pertinent Vitals/Pain Pain Assessment: Faces Faces Pain Scale: Hurts little more Pain Location: R knee with ambulation Pain Descriptors / Indicators: Sore Pain Intervention(s): Limited activity within patient's tolerance;Monitored during session    Home Living Family/patient expects to be discharged to:: Skilled nursing facility                      Prior Function Level of Independence: Needs assistance   Gait / Transfers Assistance Needed: Per chart pt is w/c bound.  Pt states she walks just fine unassisted     Comments: Pt unable to provide reliable information on preadmission status     Hand Dominance   Dominant Hand: Right    Extremity/Trunk Assessment   Upper Extremity Assessment Upper Extremity Assessment: Generalized weakness    Lower Extremity Assessment Lower Extremity Assessment: Generalized weakness    Cervical / Trunk Assessment Cervical / Trunk Assessment: Kyphotic  Communication   Communication: No difficulties  Cognition Arousal/Alertness: Awake/alert Behavior During Therapy: WFL for tasks assessed/performed Overall Cognitive Status: History of cognitive impairments - at baseline                                        General Comments  Exercises     Assessment/Plan    PT Assessment Patient needs continued PT services  PT Problem List Decreased strength;Decreased activity tolerance;Decreased balance;Decreased mobility;Decreased cognition;Decreased knowledge of use of DME;Decreased safety awareness;Pain       PT Treatment Interventions DME instruction;Gait training;Functional mobility training;Therapeutic activities;Therapeutic exercise;Balance  training;Patient/family education    PT Goals (Current goals can be found in the Care Plan section)  Acute Rehab PT Goals Patient Stated Goal: No goals stated PT Goal Formulation: With patient Time For Goal Achievement: 09/07/16 Potential to Achieve Goals: Fair    Frequency Min 3X/week   Barriers to discharge        Co-evaluation               AM-PAC PT "6 Clicks" Daily Activity  Outcome Measure Difficulty turning over in bed (including adjusting bedclothes, sheets and blankets)?: None Difficulty moving from lying on back to sitting on the side of the bed? : A Little Difficulty sitting down on and standing up from a chair with arms (e.g., wheelchair, bedside commode, etc,.)?: A Little Help needed moving to and from a bed to chair (including a wheelchair)?: A Little Help needed walking in hospital room?: A Little Help needed climbing 3-5 steps with a railing? : A Lot 6 Click Score: 18    End of Session Equipment Utilized During Treatment: Gait belt;Oxygen Activity Tolerance: Patient tolerated treatment well;Patient limited by fatigue;Patient limited by pain Patient left: in bed;with call bell/phone within reach;with nursing/sitter in room;with bed alarm set Nurse Communication: Mobility status PT Visit Diagnosis: Unsteadiness on feet (R26.81);Muscle weakness (generalized) (M62.81)    Time: 1440-1501 PT Time Calculation (min) (ACUTE ONLY): 21 min   Charges:   PT Evaluation $PT Eval Moderate Complexity: 1 Procedure     PT G Codes:        Pg [phone]   Verdon Ferrante 08/25/2016, 3:25 PM

## 2016-08-26 DIAGNOSIS — I5043 Acute on chronic combined systolic (congestive) and diastolic (congestive) heart failure: Secondary | ICD-10-CM

## 2016-08-26 LAB — CBC
HCT: 30.6 % — ABNORMAL LOW (ref 36.0–46.0)
HEMOGLOBIN: 10 g/dL — AB (ref 12.0–15.0)
MCH: 28.2 pg (ref 26.0–34.0)
MCHC: 32.7 g/dL (ref 30.0–36.0)
MCV: 86.4 fL (ref 78.0–100.0)
PLATELETS: 197 10*3/uL (ref 150–400)
RBC: 3.54 MIL/uL — AB (ref 3.87–5.11)
RDW: 14.1 % (ref 11.5–15.5)
WBC: 12.9 10*3/uL — AB (ref 4.0–10.5)

## 2016-08-26 LAB — BASIC METABOLIC PANEL
ANION GAP: 12 (ref 5–15)
BUN: 64 mg/dL — ABNORMAL HIGH (ref 6–20)
CALCIUM: 8.2 mg/dL — AB (ref 8.9–10.3)
CO2: 21 mmol/L — ABNORMAL LOW (ref 22–32)
CREATININE: 2.9 mg/dL — AB (ref 0.44–1.00)
Chloride: 105 mmol/L (ref 101–111)
GFR, EST AFRICAN AMERICAN: 16 mL/min — AB (ref >60)
GFR, EST NON AFRICAN AMERICAN: 13 mL/min — AB (ref >60)
Glucose, Bld: 137 mg/dL — ABNORMAL HIGH (ref 65–99)
Potassium: 4 mmol/L (ref 3.5–5.1)
SODIUM: 138 mmol/L (ref 135–145)

## 2016-08-26 LAB — URINE CULTURE

## 2016-08-26 MED ORDER — FUROSEMIDE 20 MG PO TABS: 20.0000 mg | ORAL_TABLET | Freq: Every day | ORAL | Status: DC

## 2016-08-26 MED ORDER — AZITHROMYCIN 250 MG PO TABS: ORAL_TABLET | ORAL | 0 refills | Status: DC

## 2016-08-26 MED ORDER — IPRATROPIUM-ALBUTEROL 0.5-2.5 (3) MG/3ML IN SOLN: 3.0000 mL | Freq: Two times a day (BID) | RESPIRATORY_TRACT | Status: DC

## 2016-08-26 MED ORDER — CLONIDINE HCL 0.1 MG PO TABS
0.1000 mg | ORAL_TABLET | Freq: Two times a day (BID) | ORAL | Status: DC
  Administered 2016-08-26: 0.1 mg via ORAL
  Filled 2016-08-26: qty 1

## 2016-08-26 MED ORDER — OXYMETAZOLINE HCL 0.05 % NA SOLN
1.0000 | Freq: Two times a day (BID) | NASAL | Status: DC | PRN
  Administered 2016-08-26: 1 via NASAL
  Filled 2016-08-26: qty 15

## 2016-08-26 MED ORDER — CEFUROXIME AXETIL 250 MG PO TABS: 250.0000 mg | ORAL_TABLET | Freq: Two times a day (BID) | ORAL | 0 refills | Status: AC

## 2016-08-26 NOTE — NC FL2 (Signed)
[place] MEDICAID FL2 LEVEL OF CARE SCREENING TOOL     IDENTIFICATION  Patient Name: [name] Birthdate: [birth year] Sex: female Admission Date (Current Location): 08/24/2016  [facility] and [place] Number:  Herbalist and Address:  [facility],  [place], [place]      Provider Number: [id number]  Attending Physician Name and Address:  Donne Hazel, MD  Relative Name and Phone Number:       Current Level of Care: Hospital Recommended Level of Care: [place] Prior Approval Number:    Date Approved/Denied:   PASRR Number: [id number]  Discharge Plan: SNF    Current Diagnoses: Patient Active Problem List   Diagnosis Date Noted  . HCAP (healthcare-associated pneumonia) 08/24/2016  . Accelerated hypertension 08/24/2016  . Acute on chronic combined systolic and diastolic CHF (congestive heart failure) ([place]) 08/24/2016  . Acute metabolic encephalopathy 79/03/4095  . Fall 04/03/2016  . Memory change 03/28/2016  . UTI (urinary tract infection) 12/15/2014  . CKD (chronic kidney disease) stage 3, GFR 30-59 ml/min 11/03/2014  . Breast cancer of upper-outer quadrant of left female breast ([place]) 10/17/2014  . Urine, incontinence, stress female 09/18/2014  . Right shoulder pain 03/21/2014  . CVA (cerebral vascular accident) ([place]) 03/14/2014  . Wedge compression fracture of T6 vertebra ([place]) 03/02/2014  . Right knee pain 02/06/2014  . Dyspnea 01/16/2014  . Anxiety state 01/16/2014  . Cerebral ischemia 12/19/2012  . Cerebral atrophy 12/19/2012  . Cerebral atherosclerosis 12/19/2012  . Anemia 12/19/2012  . Chronic diastolic congestive heart failure ([place]) 09/10/2012  . Acute respiratory failure with hypoxia ([place]) 07/18/2012  . Elevated troponin 07/18/2012  . Flat feet   . Lumbago   . Hypertension   . Pain in joint, shoulder region   . Hypertonicity of bladder   . Edema   . Hyperlipidemia   . GERD (gastroesophageal  reflux disease)     Orientation RESPIRATION BLADDER Height & Weight     Self, Place  Normal Indwelling catheter Weight: 136 lb 3.9 oz (61.8 kg) Height:  5\' 6"  (167.6 cm)  BEHAVIORAL SYMPTOMS/MOOD NEUROLOGICAL BOWEL NUTRITION STATUS      Incontinent Diet (heart healthy, fluid consistency thin)  AMBULATORY STATUS COMMUNICATION OF NEEDS Skin   Extensive Assist Verbally Normal                       Personal Care Assistance Level of Assistance  Bathing, Feeding, Dressing Bathing Assistance: Limited assistance Feeding assistance: Independent Dressing Assistance: Limited assistance     Functional Limitations Info  Sight, Hearing, Speech Sight Info: Adequate Hearing Info: Adequate Speech Info: Adequate    SPECIAL CARE FACTORS FREQUENCY  PT (By licensed PT)     PT Frequency: 5x              Contractures Contractures Info: Not present    Additional Factors Info  Allergies, Code Status Code Status Info: partial Allergies Info: sulfa drugs           Current Medications (08/26/2016):  This is the current hospital active medication list Current Facility-Administered Medications  Medication Dose Route Frequency Provider Last Rate Last Dose  . 0.9 %  sodium chloride infusion  250 mL Intravenous PRN Lavina Hamman, MD      . acetaminophen (TYLENOL) tablet 650 mg  650 mg Oral Q4H PRN Lavina Hamman, MD      . carvedilol (COREG) tablet 6.25 mg  6.25 mg Oral  BID WC Lavina Hamman, MD   6.25 mg at 08/26/16 0842  . ceFEPIme (MAXIPIME) 500 mg in dextrose 5 % 50 mL IVPB  500 mg Intravenous Q24H Poindexter, Leann T, RPH 100 mL/hr at 08/26/16 1308 500 mg at 08/26/16 1308  . cloNIDine (CATAPRES) tablet 0.1 mg  0.1 mg Oral BID Donne Hazel, MD   0.1 mg at 08/26/16 1201  . [START ON 08/27/2016] furosemide (LASIX) tablet 20 mg  20 mg Oral Daily Donne Hazel, MD      . heparin injection 5,000 Units  5,000 Units Subcutaneous Q8H Lavina Hamman, MD   5,000 Units at 08/26/16 [phone]   . ipratropium-albuterol (DUONEB) 0.5-2.5 (3) MG/3ML nebulizer solution 3 mL  3 mL Nebulization QID Lavina Hamman, MD   3 mL at 08/26/16 1150  . isosorbide mononitrate (IMDUR) 24 hr tablet 60 mg  60 mg Oral QHS Lavina Hamman, MD   60 mg at 08/25/16 2101  . latanoprost (XALATAN) 0.005 % ophthalmic solution 1 drop  1 drop Both Eyes QHS Lavina Hamman, MD   1 drop at 08/25/16 2101  . ondansetron (ZOFRAN) injection 4 mg  4 mg Intravenous Q6H PRN Lavina Hamman, MD      . oxymetazoline (AFRIN) 0.05 % nasal spray 1 spray  1 spray Each Nare BID PRN Donne Hazel, MD   1 spray at 08/26/16 1201  . predniSONE (DELTASONE) tablet 40 mg  40 mg Oral Q supper Lavina Hamman, MD   40 mg at 08/25/16 1825  . sodium chloride flush (NS) 0.9 % injection 3 mL  3 mL Intravenous Q12H Lavina Hamman, MD   3 mL at 08/26/16 1000  . sodium chloride flush (NS) 0.9 % injection 3 mL  3 mL Intravenous PRN Lavina Hamman, MD         Discharge Medications: Please see discharge summary for a list of discharge medications.  Relevant Imaging Results:  Relevant Lab Results:   Additional Information SS# [id number]  Nila Nephew, LCSW

## 2016-08-26 NOTE — Discharge Summary (Addendum)
Physician Discharge Summary  [name] OVZ:[id number] DOB: [birth year] DOA: 08/24/2016  PCP: [name], MD  Admit date: 08/24/2016 Discharge date: 08/26/2016  Admitted From: SNF Disposition:  SNF  Recommendations for Outpatient Follow-up:  1. Follow up with PCP in 1-2 weeks 2. Repeat BMET in 1 week, focus on renal function 3. Recommend checking daily weights. Notify covering MD if weight goes up 5 pounds in 7 days. 4. Recommend holding PO lasix x 2 days after discharge, resume home lasix on 08/29/16  Discharge Condition:Improved CODE STATUS:Partial (bipap OK) Diet recommendation: Heart healthy   Brief/Interim Summary: 83 y.o.femalewith Past medical history of coronary artery disease, medically managed, chronic kidney disease stage III, CVA, breast cancer, dementia, dyslipidemia, hypertension, chronic systolic CHF. Patient was brought in from friend's home for lethargy as well as increased shortness of breath. Reportedly she also had elevated blood pressure when the EMS arrived to see her. Patient was given Solu-Medrol IV and was placed on C Pap and brought to the hospital. Daughter was at bedside mentioned that patient did not have any other acute complaint on last Tuesday when she saw her last but there was some increased lethargy. Daughter also thinks that she may have some medication changed at the nursing home recently but unaware of the names or changes. Further history is significantly limited since patient is unable to answered what happened but at the time of my evaluation denies having any complaints of chest pain, abdominal pain, nausea, vomiting, increased shortness of breath  1. Acute respiratory failure with hypoxia. Acute on chronic combined diastolic and systolic CHF. Healthcare associated pneumonia. Acute metabolic encephalopathy. Meeting sepsis criteria on admission secondary to pneumonia with end-organ damage. -Patient initially presented hypoxic and lethargic.  Patient required BiPAP initially -Chest x-ray reviewed, initial concerns for right-sided pneumonia -Patient does have history of underlying systolic heart failure - patient was given neb treatments, IV Solu-Medrol, IV steroids overnight with dramatic improvement by the following morning. -Patient was continued on scheduled IV Lasix. Cr increased from 2.2 to 2.9. Baseline Cr in the mid-2's  -Pt was started on empiric IV antibiotics. Pt to complete 5 more days of ceftin and azithromycin  2.Accelerated hypertension. Lethargy. -Have transitioned clonidine from tablet to patch initially. PO clonidine resumed -Blood pressures improved. Mentation improved markedly  3.Elevated troponin. -Likely demand ischemia from hypertension as well as hypoxia. -Cardiac cath September 2016 shows severe multivessel disease, EF 35-40% as well. -Family has chosen medical management at that time and there is no change in that opinion. -2-D echocardiogram ordered, reviewed with findings of severe LVH with no WMA -Troponin trends reviewed, trending down -Patient presently denies chest pain  4.Chronic kidney disease stage III-IV. -Renal function close to baseline at time of presentation. -Cr has increased to 2.9 with aggressive diuresis. Would hold further lasix x 2 days, then resume home lasix -Recommend repeat basic metabolic panel in 1 week  5.History of CVA. -Appears be stable at this time -Continue to monitor closely.  Discharge Diagnoses:  Principal Problem:   Acute respiratory failure with hypoxia (HCC) Active Problems:   Hypertension   Hyperlipidemia   GERD (gastroesophageal reflux disease)   Elevated troponin   CVA (cerebral vascular accident) ([place])   Breast cancer of upper-outer quadrant of left female breast ([place])   CKD (chronic kidney disease) stage 3, GFR 30-59 ml/min   HCAP (healthcare-associated pneumonia)   Accelerated hypertension   Acute on chronic combined systolic and  diastolic CHF (congestive heart failure) ([place])   Acute  metabolic encephalopathy    Discharge Instructions   Allergies as of 08/26/2016      Reactions   Sulfa Antibiotics Itching      Medication List    TAKE these medications   acetaminophen 325 MG tablet Commonly known as:  TYLENOL Take 650 mg by mouth 2 (two) times daily. Take 2 tablets every 6 hours as needed for mild pain or fever   alum & mag hydroxide-simeth 200-200-20 MG/5ML suspension Commonly known as:  MAALOX/MYLANTA Take by mouth. 73ml every 4 hours as needed for indigestion   ARTIFICIAL TEARS OP Apply 2 drops to eye 2 (two) times daily. Both eyes   atorvastatin 20 MG tablet Commonly known as:  LIPITOR Take one tablet by mouth once daily for cholesterol What changed:  how much to take  how to take this  when to take this  additional instructions   azithromycin 250 MG tablet Commonly known as:  ZITHROMAX 1 tab po daily x 5 more days, zero refills   BETIMOL 0.5 % ophthalmic solution Generic drug:  timolol Place 1 drop into both eyes every morning.   carvedilol 6.25 MG tablet Commonly known as:  COREG Take 6.25 mg by mouth 2 (two) times daily.   cefUROXime 250 MG tablet Commonly known as:  CEFTIN Take 1 tablet (250 mg total) by mouth 2 (two) times daily.   cloNIDine 0.1 MG tablet Commonly known as:  CATAPRES Take 0.1 mg by mouth 2 (two) times daily.   Coenzyme Q10 50 MG Tabs Take one tablet by mouth at bedtime   famotidine 20 MG tablet Commonly known as:  PEPCID Take 20 mg by mouth daily.   furosemide 20 MG tablet Commonly known as:  LASIX Take 20 mg by mouth daily.   isosorbide mononitrate 60 MG 24 hr tablet Commonly known as:  IMDUR Take 60 mg by mouth at bedtime.   KLOR-CON 10 10 MEQ tablet Generic drug:  potassium chloride Take 10 mEq by mouth daily.   latanoprost 0.005 % ophthalmic solution Commonly known as:  XALATAN Place 1 drop into both eyes at bedtime.   mirtazapine 7.5  MG tablet Commonly known as:  REMERON TAKE 1 TABLET (7.5 MG TOTAL) BY MOUTH AT BEDTIME. NIGHTLY FOR APPETITE   vitamin B-12 1000 MCG tablet Commonly known as:  CYANOCOBALAMIN Take 1 tablet by mouth twice daily On Mondays,Wednesdays and Fridays       Allergies  Allergen Reactions  . Sulfa Antibiotics Itching    Procedures/Studies: Dg Chest Port 1 View  Result Date: 08/24/2016 CLINICAL DATA:  Patient with worsening shortness of breath. EXAM: PORTABLE CHEST 1 VIEW COMPARISON:  Chest radiograph 03/07/2015. FINDINGS: Monitoring leads overlie the patient. Patient is mildly rotated to the right. Cardiac contours upper limits of normal. Interval development of patchy consolidation within the right mid lung with associated heterogeneous opacities right lower lung. Small right pleural effusion. Left lung is clear. Bilateral shoulder joint degenerative changes. IMPRESSION: Interval development of consolidation predominately within the right mid lung with heterogeneous right lower lung opacities favored to represent pneumonia in the appropriate clinical setting. Followup PA and lateral chest X-ray is recommended in 3-4 weeks following trial of antibiotic therapy to ensure resolution and exclude underlying malignancy. Small right pleural effusion. Electronically Signed   By: Lovey Newcomer M.D.   On: 08/24/2016 10:53   Dg Swallowing Func-speech Pathology  Result Date: 08/25/2016 Objective Swallowing Evaluation: Type of Study: MBS-Modified Barium Swallow Study Patient Details Name: NELWYN HEBDON MRN: [id number]  Date of Birth: 07/03/1925 Today's Date: 08/25/2016 Time: SLP Start Time (ACUTE ONLY): 1332-SLP Stop Time (ACUTE ONLY): 1348 SLP Time Calculation (min) (ACUTE ONLY): 16 min Past Medical History: Past Medical History: Diagnosis Date . Arthritis   Osteoarthritis . Cancer of left breast [facility]) August 2016 . Cerebral atherosclerosis 12/19/2012 . Cerebral atrophy 12/19/2012 . Cerebral ischemia 12/19/2012   Microvascular  . CKD (chronic kidney disease) stage 3, GFR 30-59 ml/min 11/03/2014 . Cough  . Dementia  . Diverticulosis of colon (without mention of hemorrhage)  . Edema  . Flat feet  . GERD (gastroesophageal reflux disease)  . Glaucoma  . Heart murmur  . Hyperlipidemia  . Hypertension  . Hypertonicity of bladder  . Ischemic cardiomyopathy 11/04/2014 . Lumbago  . Memory change 03/28/2016 . Osteoarthrosis, unspecified whether generalized or localized, unspecified site  . Other malaise and fatigue  . Pain in joint, pelvic region and thigh  . Pain in joint, shoulder region  . Personal history of fall  . RLQ abdominal pain 01/17/2011 . Senile osteoporosis  . Small bowel obstruction ([place])  . STEMI (ST elevation myocardial infarction) ([place]) 11/04/2014 . Unstable gait 11/04/2014 . Urine, incontinence, stress female 09/18/2014 Past Surgical History: Past Surgical History: Procedure Laterality Date . BREAST LUMPECTOMY Bilateral  . BREAST LUMPECTOMY WITH RADIOACTIVE SEED LOCALIZATION Left 03/15/2015  Procedure: LEFT BREAST LUMPECTOMY WITH RADIOACTIVE SEED LOCALIZATION;  Surgeon: Fanny Skates, MD;  Location: [facility];  Service: General;  Laterality: Left; . CARDIAC CATHETERIZATION N/A 11/08/2014  Procedure: Left Heart Cath and Coronary Angiography;  Surgeon: [name], MD;  Location: [facility];  Service: Cardiovascular;  Laterality: N/A; . COLON SURGERY  02/1996  Colectomy, partial for diverticular bleed 90% . EYE SURGERY   . JOINT REPLACEMENT Left 12/2000  Left knee; Dr. Wynelle Link . LAPAROSCOPIC LYSIS OF ADHESIONS  11/15/10  Exploratory . TONSILLECTOMY   HPI: JERIKA WALES is a 83 y.o. female history of dementia, reflux, hyperlipidemia, hypertension, previous breast cancer here presenting with altered mental status, fever, hypoxia.  Found to have right sided PNA.  No Data Recorded Assessment / Plan / Recommendation CHL IP CLINICAL IMPRESSIONS 08/25/2016 Clinical Impression Pt demonstrates normal oropharyngeal swallow  function for age. Mild premature spillage of solids to the valleculae while masticating. No penetration or aspiration. No SLP f/u needed will sign off.  SLP Visit Diagnosis Dysphagia, unspecified (R13.10) Attention and concentration deficit following -- Frontal lobe and executive function deficit following -- Impact on safety and function Mild aspiration risk   CHL IP TREATMENT RECOMMENDATION 08/25/2016 Treatment Recommendations No treatment recommended at this time   No flowsheet data found. CHL IP DIET RECOMMENDATION 08/25/2016 SLP Diet Recommendations Regular solids;Thin liquid Liquid Administration via Cup;Straw Medication Administration Whole meds with liquid Compensations -- Postural Changes Seated upright at 90 degrees   CHL IP OTHER RECOMMENDATIONS 08/25/2016 Recommended Consults -- Oral Care Recommendations Oral care BID Other Recommendations --   CHL IP FOLLOW UP RECOMMENDATIONS 08/25/2016 Follow up Recommendations None   No flowsheet data found.     CHL IP ORAL PHASE 08/25/2016 Oral Phase WFL Oral - Pudding Teaspoon -- Oral - Pudding Cup -- Oral - Honey Teaspoon -- Oral - Honey Cup -- Oral - Nectar Teaspoon -- Oral - Nectar Cup -- Oral - Nectar Straw -- Oral - Thin Teaspoon -- Oral - Thin Cup -- Oral - Thin Straw -- Oral - Puree -- Oral - Mech Soft -- Oral - Regular -- Oral - Multi-Consistency -- Oral - Pill -- Oral  Phase - Comment --  CHL IP PHARYNGEAL PHASE 08/25/2016 Pharyngeal Phase WFL Pharyngeal- Pudding Teaspoon -- Pharyngeal -- Pharyngeal- Pudding Cup -- Pharyngeal -- Pharyngeal- Honey Teaspoon -- Pharyngeal -- Pharyngeal- Honey Cup -- Pharyngeal -- Pharyngeal- Nectar Teaspoon -- Pharyngeal -- Pharyngeal- Nectar Cup -- Pharyngeal -- Pharyngeal- Nectar Straw -- Pharyngeal -- Pharyngeal- Thin Teaspoon -- Pharyngeal -- Pharyngeal- Thin Cup -- Pharyngeal -- Pharyngeal- Thin Straw -- Pharyngeal -- Pharyngeal- Puree -- Pharyngeal -- Pharyngeal- Mechanical Soft -- Pharyngeal -- Pharyngeal- Regular -- Pharyngeal --  Pharyngeal- Multi-consistency -- Pharyngeal -- Pharyngeal- Pill -- Pharyngeal -- Pharyngeal Comment --  No flowsheet data found. No flowsheet data found. DeBlois, Katherene Ponto 08/25/2016, 2:33 PM               Subjective: Feeling better. Eager to return to facility  Discharge Exam: Vitals:   08/26/16 1201 08/26/16 1434  BP: (!) 177/78 (!) 167/70  Pulse: 75 79  Resp:  18  Temp:  98.8 F (37.1 C)   Vitals:   08/26/16 0910 08/26/16 1150 08/26/16 1201 08/26/16 1434  BP: (!) 152/93  (!) 177/78 (!) 167/70  Pulse: 94  75 79  Resp:    18  Temp:    98.8 F (37.1 C)  TempSrc:    Oral  SpO2: 95% 93% 96% 93%  Weight:      Height:        General: Pt is alert, awake, not in acute distress Cardiovascular: RRR, S1/S2 +, no rubs, no gallops Respiratory: CTA bilaterally, no wheezing, no rhonchi Abdominal: Soft, NT, ND, bowel sounds + Extremities: no edema, no cyanosis   The results of significant diagnostics from this hospitalization (including imaging, microbiology, ancillary and laboratory) are listed below for reference.     Microbiology: Recent Results (from the past 240 hour(s))  Blood Culture (routine x 2)     Status: None (Preliminary result)   Collection Time: 08/24/16 10:53 AM  Result Value Ref Range Status   Specimen Description BLOOD RIGHT ANTECUBITAL  Final   Special Requests   Final    BOTTLES DRAWN AEROBIC AND ANAEROBIC Blood Culture adequate volume   Culture   Final    NO GROWTH 2 DAYS Performed at [facility], [address] [place], [place] [address]    Report Status PENDING  Incomplete  Urine culture     Status: Abnormal   Collection Time: 08/24/16 10:53 AM  Result Value Ref Range Status   Specimen Description URINE, RANDOM  Final   Special Requests NONE  Final   Culture >=100,000 COLONIES/mL ESCHERICHIA COLI (A)  Final   Report Status 08/26/2016 FINAL  Final   Organism ID, Bacteria ESCHERICHIA COLI (A)  Final      Susceptibility   Escherichia coli  - MIC*    AMPICILLIN 8 SENSITIVE Sensitive     CEFAZOLIN <=4 SENSITIVE Sensitive     CEFTRIAXONE <=1 SENSITIVE Sensitive     CIPROFLOXACIN <=0.25 SENSITIVE Sensitive     GENTAMICIN <=1 SENSITIVE Sensitive     IMIPENEM <=0.25 SENSITIVE Sensitive     NITROFURANTOIN <=16 SENSITIVE Sensitive     TRIMETH/SULFA <=20 SENSITIVE Sensitive     AMPICILLIN/SULBACTAM 4 SENSITIVE Sensitive     PIP/TAZO <=4 SENSITIVE Sensitive     Extended ESBL NEGATIVE Sensitive     * >=100,000 COLONIES/mL ESCHERICHIA COLI  Blood Culture (routine x 2)     Status: None (Preliminary result)   Collection Time: 08/24/16 11:09 AM  Result Value Ref Range Status  Specimen Description BLOOD BLOOD RIGHT FOREARM  Final   Special Requests   Final    BOTTLES DRAWN AEROBIC AND ANAEROBIC Blood Culture adequate volume   Culture   Final    NO GROWTH 2 DAYS Performed at [facility], [address], [place], [place] [address]    Report Status PENDING  Incomplete  MRSA PCR Screening     Status: None   Collection Time: 08/24/16  2:15 PM  Result Value Ref Range Status   MRSA by PCR NEGATIVE NEGATIVE Final    Comment:        The GeneXpert MRSA Assay (FDA approved for NASAL specimens only), is one component of a comprehensive MRSA colonization surveillance program. It is not intended to diagnose MRSA infection nor to guide or monitor treatment for MRSA infections.      Labs: BNP (last 3 results)  Recent Labs  08/24/16 1053  BNP 604.5*   Basic Metabolic Panel:  Recent Labs Lab 08/24/16 1053 08/25/16 0105 08/26/16 0427  NA 140 138 138  K 3.8 3.4* 4.0  CL 109 106 105  CO2 21* 22 21*  GLUCOSE 127* 161* 137*  BUN 35* 41* 64*  CREATININE 2.04* 2.20* 2.90*  CALCIUM 8.8* 8.5* 8.2*  MG  --  1.9  --    Liver Function Tests:  Recent Labs Lab 08/24/16 1053  AST 17  ALT 9*  ALKPHOS 42  BILITOT 0.9  PROT 7.1  ALBUMIN 3.8   No results for input(s): LIPASE, AMYLASE in the last 168 hours. No results  for input(s): AMMONIA in the last 168 hours. CBC:  Recent Labs Lab 08/24/16 1053 08/25/16 0105 08/26/16 0427  WBC 10.8* 19.8* 12.9*  NEUTROABS 8.2*  --   --   HGB 12.2 11.1* 10.0*  HCT 36.3 33.0* 30.6*  MCV 89.9 88.2 86.4  PLT 186 190 197   Cardiac Enzymes:  Recent Labs Lab 08/24/16 1444 08/24/16 2023 08/25/16 0105  TROPONINI 0.46* 0.31* 0.22*   BNP: Invalid input(s): POCBNP CBG: No results for input(s): GLUCAP in the last 168 hours. D-Dimer No results for input(s): DDIMER in the last 72 hours. Hgb A1c No results for input(s): HGBA1C in the last 72 hours. Lipid Profile No results for input(s): CHOL, HDL, LDLCALC, TRIG, CHOLHDL, LDLDIRECT in the last 72 hours. Thyroid function studies No results for input(s): TSH, T4TOTAL, T3FREE, THYROIDAB in the last 72 hours.  Invalid input(s): FREET3 Anemia work up No results for input(s): VITAMINB12, FOLATE, FERRITIN, TIBC, IRON, RETICCTPCT in the last 72 hours. Urinalysis    Component Value Date/Time   COLORURINE YELLOW 08/24/2016 1053   APPEARANCEUR CLOUDY (A) 08/24/2016 1053   LABSPEC 1.011 08/24/2016 1053   PHURINE 5.0 08/24/2016 1053   GLUCOSEU NEGATIVE 08/24/2016 1053   HGBUR SMALL (A) 08/24/2016 1053   BILIRUBINUR NEGATIVE 08/24/2016 1053   KETONESUR NEGATIVE 08/24/2016 1053   PROTEINUR 100 (A) 08/24/2016 1053   UROBILINOGEN 0.2 11/04/2014 1613   NITRITE NEGATIVE 08/24/2016 1053   LEUKOCYTESUR LARGE (A) 08/24/2016 1053   Sepsis Labs Invalid input(s): PROCALCITONIN,  WBC,  LACTICIDVEN Microbiology Recent Results (from the past 240 hour(s))  Blood Culture (routine x 2)     Status: None (Preliminary result)   Collection Time: 08/24/16 10:53 AM  Result Value Ref Range Status   Specimen Description BLOOD RIGHT ANTECUBITAL  Final   Special Requests   Final    BOTTLES DRAWN AEROBIC AND ANAEROBIC Blood Culture adequate volume   Culture   Final  NO GROWTH 2 DAYS Performed at [facility], [place] [address] [place], [place] [address]    Report Status PENDING  Incomplete  Urine culture     Status: Abnormal   Collection Time: 08/24/16 10:53 AM  Result Value Ref Range Status   Specimen Description URINE, RANDOM  Final   Special Requests NONE  Final   Culture >=100,000 COLONIES/mL ESCHERICHIA COLI (A)  Final   Report Status 08/26/2016 FINAL  Final   Organism ID, Bacteria ESCHERICHIA COLI (A)  Final      Susceptibility   Escherichia coli - MIC*    AMPICILLIN 8 SENSITIVE Sensitive     CEFAZOLIN <=4 SENSITIVE Sensitive     CEFTRIAXONE <=1 SENSITIVE Sensitive     CIPROFLOXACIN <=0.25 SENSITIVE Sensitive     GENTAMICIN <=1 SENSITIVE Sensitive     IMIPENEM <=0.25 SENSITIVE Sensitive     NITROFURANTOIN <=16 SENSITIVE Sensitive     TRIMETH/SULFA <=20 SENSITIVE Sensitive     AMPICILLIN/SULBACTAM 4 SENSITIVE Sensitive     PIP/TAZO <=4 SENSITIVE Sensitive     Extended ESBL NEGATIVE Sensitive     * >=100,000 COLONIES/mL ESCHERICHIA COLI  Blood Culture (routine x 2)     Status: None (Preliminary result)   Collection Time: 08/24/16 11:09 AM  Result Value Ref Range Status   Specimen Description BLOOD BLOOD RIGHT FOREARM  Final   Special Requests   Final    BOTTLES DRAWN AEROBIC AND ANAEROBIC Blood Culture adequate volume   Culture   Final    NO GROWTH 2 DAYS Performed at [facility], [address], [place], [place] [address]    Report Status PENDING  Incomplete  MRSA PCR Screening     Status: None   Collection Time: 08/24/16  2:15 PM  Result Value Ref Range Status   MRSA by PCR NEGATIVE NEGATIVE Final    Comment:        The GeneXpert MRSA Assay (FDA approved for NASAL specimens only), is one component of a comprehensive MRSA colonization surveillance program. It is not intended to diagnose MRSA infection nor to guide or monitor treatment for MRSA infections.      SIGNED:   Donne Hazel, MD  [facility] 08/26/2016, 3:02 PM  If 7PM-7AM, please contact  night-coverage [email or web address] Password [email or web address]

## 2016-08-26 NOTE — Progress Notes (Signed)
Discharge instructions called to [facility] at [facility]. Condition stable. Incontinent of urine and stool. Awaiting [facility] for transport to facility. Eulas Post, RN

## 2016-08-26 NOTE — Progress Notes (Signed)
CSW spoke with facility representative ([facility]) and with pt's daughter [name]- both in agreement with plan to transfer back to [facility] [place]. Facility states no insurance authorization needed as pt "is in private pay bed."  Transmitted all information to facility via the [facility]. Pt will transport via [facility]- completed medical necessity form and will arrange transportation. Family notified- pt's daughter via phone. Report #: [id number]. Plan: DC to [facility] today.    Sharren Bridge, MSW, LCSW Clinical Social Work 08/26/2016 [phone]

## 2016-08-26 NOTE — Evaluation (Signed)
Occupational Therapy Evaluation Patient Details Name: [name] MRN: [id number] DOB: 1925-10-10 Today's Date: 08/26/2016    History of Present Illness Pt admitted from [facility] with acute respiratory failure with hypoxia, CHF, sepsis and metabolic encephalopathy.  Pt wtih hx of CAD, STEMI, cerebral ischemia, dementia, and ischemic cardiomyopathy.   Clinical Impression   Pt admitted with  ARF. Pt currently with functional limitations due to the deficits listed below (see OT Problem List).  Pt will benefit from skilled OT to increase their safety and independence with ADL and functional mobility for ADL to facilitate discharge to venue listed below.      Follow Up Recommendations  SNF    Equipment Recommendations  None recommended by OT    Recommendations for Other Services       Precautions / Restrictions Precautions Precautions: Fall Restrictions Weight Bearing Restrictions: No      Mobility Bed Mobility Overal bed mobility: Needs Assistance Bed Mobility: Supine to Sit;Sit to Supine     Supine to sit: Min assist Sit to supine: Min assist      Transfers Overall transfer level: Needs assistance Equipment used: Rolling walker (2 wheeled) Transfers: Sit to/from [facility] Sit to Stand: Min assist Stand pivot transfers: Min assist            Balance Overall balance assessment: Needs assistance Sitting-balance support: No upper extremity supported;Feet supported Sitting balance-Leahy Scale: Good     Standing balance support: Bilateral upper extremity supported Standing balance-Leahy Scale: Poor                             ADL either performed or assessed with clinical judgement   ADL Overall ADL's : Needs assistance/impaired Eating/Feeding: Set up;Sitting   Grooming: Set up;Sitting   Upper Body Bathing: Minimal assistance;Sitting   Lower Body Bathing: Maximal assistance;Sit to/from stand;Cueing for safety;Cueing  for sequencing   Upper Body Dressing : Minimal assistance;Sitting   Lower Body Dressing: Maximal assistance;Sit to/from stand;Cueing for safety;Cueing for sequencing   Toilet Transfer: Minimal assistance;BSC;Cueing for safety;Cueing for sequencing;RW   Toileting- Clothing Manipulation and Hygiene: Sit to/from stand;Cueing for safety;Cueing for sequencing;Moderate assistance               Vision Patient Visual Report: No change from baseline       Perception     Praxis      Pertinent Vitals/Pain Faces Pain Scale: Hurts a little bit Pain Location:  R knee Pain Descriptors / Indicators: Sore Pain Intervention(s): Monitored during session;Limited activity within patient's tolerance     Hand Dominance Right   Extremity/Trunk Assessment         Cervical / Trunk Assessment Cervical / Trunk Assessment: Kyphotic   Communication Communication Communication: No difficulties   Cognition Arousal/Alertness: Awake/alert Behavior During Therapy: WFL for tasks assessed/performed Overall Cognitive Status: History of cognitive impairments - at baseline                                                Home Living Family/patient expects to be discharged to:: Skilled nursing facility  Prior Functioning/Environment Level of Independence: Needs assistance  Gait / Transfers Assistance Needed: Per chart pt is w/c bound.  Pt states she walks just fine unassisted     Comments: Pt unable to provide reliable information on preadmission status        OT Problem List: Decreased strength;Decreased activity tolerance;Decreased knowledge of use of DME or AE;Decreased safety awareness      OT Treatment/Interventions: Self-care/ADL training;Patient/family education;DME and/or AE instruction    OT Goals(Current goals can be found in the care plan section) Acute Rehab OT Goals Patient Stated Goal: No goals  stated OT Goal Formulation: With patient Time For Goal Achievement: 09/09/16 Potential to Achieve Goals: Good ADL Goals Pt Will Perform Grooming: with set-up;sitting Pt Will Perform Upper Body Dressing: with set-up;sitting Pt Will Perform Lower Body Dressing: with min assist;sit to/from stand Pt Will Transfer to Toilet: with supervision;bedside commode Pt Will Perform Toileting - Clothing Manipulation and hygiene: with supervision;sit to/from stand  OT Frequency: Min 2X/week   Barriers to D/C:               AM-PAC PT "6 Clicks" Daily Activity     Outcome Measure Help from another person eating meals?: A Little Help from another person taking care of personal grooming?: A Little Help from another person toileting, which includes using toliet, bedpan, or urinal?: A Lot Help from another person bathing (including washing, rinsing, drying)?: Total Help from another person to put on and taking off regular upper body clothing?: A Little Help from another person to put on and taking off regular lower body clothing?: Total 6 Click Score: 13   End of Session Nurse Communication: Mobility status  Activity Tolerance: Patient tolerated treatment well Patient left: in bed;with call bell/phone within reach;with family/visitor present;with bed alarm set  OT Visit Diagnosis: Unsteadiness on feet (R26.81);Pain;History of falling (Z91.81);Muscle weakness (generalized) (M62.81)                Time: 1439-1500 OT Time Calculation (min): 21 min Charges:  OT General Charges $OT Visit: 1 Procedure OT Evaluation $OT Eval Moderate Complexity: 1 Procedure G-Codes:     Kari Baars, OT [phone]  Payton Mccallum D 08/26/2016, 3:05 PM

## 2016-08-26 NOTE — Clinical Social Work Note (Signed)
Clinical Social Work Assessment  Patient Details  Name: [name] MRN: [id number] Date of Birth: 08-23-1925  Date of referral:  08/26/16               Reason for consult:   (pt admitted from facility)                Permission sought to share information with:  Facility Sport and exercise psychologist, Family Supports Permission granted to share information::  Yes, Verbal Permission Granted  Name::     daughter [name]::  [facility]  Relationship::     Contact Information:     Housing/Transportation Living arrangements for the past 2 months:  [place] of Information:  Patient, Medical Team Patient Interpreter Needed:  None Criminal Activity/Legal Involvement Pertinent to Current Situation/Hospitalization:  No - Comment as needed Significant Relationships:  Adult Children, Warehouse manager Lives with:  Facility Resident Do you feel safe going back to the place where you live?  Yes Need for family participation in patient care:  Yes (Comment) (pt with dementia)  Care giving concerns:  Pt from [facility]- reports no caregiving concerns.   Social Worker assessment / plan:  CSW consulted as pt is admitted from facility- [facility].  CSW met with pt who was pleasant and receptive but lacked orientation to situation and was poor historian (chart indicates hx of dementia). She reports "she has lived there a while" and "I am going back to [facility]" at [place] left voicemails for both pt's daughter and facility admissions office seeking more information.  Plan: Will return to [facility] at [place]- will follow and assist.   Employment status:  Retired Nurse, adult PT Recommendations:  [place] / Referral to community resources:     Patient/Family's Response to care:  Pt pleasant but minimal response   Patient/Family's Understanding of and Emotional Response to  Diagnosis, Current Treatment, and Prognosis:  Pt demonstrated understanding of location and plan to return to her previous living arrangements but otherwise unable to demonstrate understanding of situation to CSW  Emotional Assessment Appearance:  Appears older than stated age Attitude/Demeanor/Rapport:   (appropriate) Affect (typically observed):  Calm (pleasant) Orientation:  Oriented to Self, Oriented to Place Alcohol / Substance use:  Not Applicable Psych involvement (Current and /or in the community):  No (Comment)  Discharge Needs  Concerns to be addressed:  Discharge Planning Concerns Readmission within the last 30 days:  No Current discharge risk:  None Barriers to Discharge:  Continued Medical Work up   [employer], [place] 08/26/2016, 8:29 AM  [phone]

## 2016-08-27 ENCOUNTER — Non-Acute Institutional Stay (SKILLED_NURSING_FACILITY): Payer: Medicare Other | Admitting: Internal Medicine

## 2016-08-27 ENCOUNTER — Encounter: Payer: Self-pay | Admitting: Internal Medicine

## 2016-08-27 DIAGNOSIS — K219 Gastro-esophageal reflux disease without esophagitis: Secondary | ICD-10-CM

## 2016-08-27 DIAGNOSIS — N184 Chronic kidney disease, stage 4 (severe): Secondary | ICD-10-CM | POA: Diagnosis not present

## 2016-08-27 DIAGNOSIS — D638 Anemia in other chronic diseases classified elsewhere: Secondary | ICD-10-CM | POA: Diagnosis not present

## 2016-08-27 DIAGNOSIS — J189 Pneumonia, unspecified organism: Secondary | ICD-10-CM

## 2016-08-27 DIAGNOSIS — M1711 Unilateral primary osteoarthritis, right knee: Secondary | ICD-10-CM

## 2016-08-27 DIAGNOSIS — I10 Essential (primary) hypertension: Secondary | ICD-10-CM

## 2016-08-27 DIAGNOSIS — R5381 Other malaise: Secondary | ICD-10-CM | POA: Diagnosis not present

## 2016-08-27 DIAGNOSIS — D72829 Elevated white blood cell count, unspecified: Secondary | ICD-10-CM | POA: Diagnosis not present

## 2016-08-27 DIAGNOSIS — E785 Hyperlipidemia, unspecified: Secondary | ICD-10-CM

## 2016-08-27 DIAGNOSIS — I5032 Chronic diastolic (congestive) heart failure: Secondary | ICD-10-CM

## 2016-08-27 NOTE — Progress Notes (Signed)
Provider:  [name] MD  Location:  [facility] Room Number: 5 Place of Service:  SNF (31)  PCP: [name], MD Patient Care Team: [name], MD as PCP - General (Internal Medicine) Beth Craver, MD as Consulting Physician (Gastroenterology) Shon Hough, MD as Consulting Physician (Ophthalmology) Thornell Sartorius, MD as Consulting Physician (Otolaryngology) [place], [place] Mast, Man X, NP as Nurse Practitioner (Nurse Practitioner) Bo Merino, MD as Consulting Physician (Rheumatology) Suella Broad, MD as Consulting Physician (Physical Medicine and Rehabilitation) Netta Cedars, MD as Consulting Physician (Orthopedic Surgery) Fanny Skates, MD as Consulting Physician (General Surgery) Truitt Merle, MD as Consulting Physician (Hematology) Thea Silversmith, MD (Inactive) as Consulting Physician (Radiation Oncology) Rockwell Germany, RN as Registered Nurse Mauro Kaufmann, RN as Registered Nurse Jake Shark, Johny Blamer, NP as Nurse Practitioner (Nurse Practitioner) [name], MD as Consulting Physician (Cardiology)  Extended Emergency Contact Information Primary Emergency Contact: [name],[name] Address: [address]          [place] [address] [place]te [place] of [place] Phone: [phone] Mobile Phone: [phone] Relation: Daughter Secondary Emergency Contact: Mort Sawyers [place] of [place] Phone: [phone] Relation: None  Code Status: DNR Goals of Care: Advanced Directive information Advanced Directives 08/24/2016  Does Patient Have a Medical Advance Directive? Yes  Type of Advance Directive Out of facility DNR (pink MOST or yellow form)  Does patient want to make changes to medical advance directive? No - Patient declined  Copy of [place] in Chart? -  Pre-existing out of facility DNR order (yellow form or pink MOST form) Yellow form placed in chart (order  not valid for inpatient use)      Chief Complaint  Patient presents with  . Readmit To SNF    Readmission Visit     HPI: Patient is a 83 y.o. female seen today for re-admission visit. She is a resident at [facility] and was in the hospital from 08/24/16-08/26/16 with acute respiratory failure from chf exacerbation and pneumonia. She met criteria for sepsis. She required BiPAP and also received iv steroids, iv diuresis and iv antibiotics. Her BP medication had to be adjusted with her elevated BP reading. She had demand ischemia in setting of sepsis and chf exacerbation and medical management was opted. She is seen in her room today with her daughter and grand kids at bedside. She has medical history of dementia, ckd, breast cancer, CVA, CAD, CHF among others.   Past Medical History:  Diagnosis Date  . Arthritis    Osteoarthritis  . Cancer of left breast [facility]) August 2016  . Cerebral atherosclerosis 12/19/2012  . Cerebral atrophy 12/19/2012  . Cerebral ischemia 12/19/2012   Microvascular   . CKD (chronic kidney disease) stage 3, GFR 30-59 ml/min 11/03/2014  . Cough   . Dementia   . Diverticulosis of colon (without mention of hemorrhage)   . Edema   . Flat feet   . GERD (gastroesophageal reflux disease)   . Glaucoma   . Heart murmur   . Hyperlipidemia   . Hypertension   . Hypertonicity of bladder   . Ischemic cardiomyopathy 11/04/2014  . Lumbago   . Memory change 03/28/2016  . Osteoarthrosis, unspecified whether generalized or localized, unspecified site   . Other malaise and fatigue   . Pain in joint, pelvic region and thigh   . Pain in joint, shoulder region   . Personal history of fall   . RLQ abdominal pain 01/17/2011  .  Senile osteoporosis   . Small bowel obstruction ([place])   . STEMI (ST elevation myocardial infarction) ([place]) 11/04/2014  . Unstable gait 11/04/2014  . Urine, incontinence, stress female 09/18/2014   Past Surgical History:  Procedure Laterality Date  . BREAST  LUMPECTOMY Bilateral   . BREAST LUMPECTOMY WITH RADIOACTIVE SEED LOCALIZATION Left 03/15/2015   Procedure: LEFT BREAST LUMPECTOMY WITH RADIOACTIVE SEED LOCALIZATION;  Surgeon: Fanny Skates, MD;  Location: [place];  Service: General;  Laterality: Left;  . CARDIAC CATHETERIZATION N/A 11/08/2014   Procedure: Left Heart Cath and Coronary Angiography;  Surgeon: [name], MD;  Location: [place] CV LAB;  Service: Cardiovascular;  Laterality: N/A;  . COLON SURGERY  02/1996   Colectomy, partial for diverticular bleed 90%  . EYE SURGERY    . JOINT REPLACEMENT Left 12/2000   Left knee; Dr. Wynelle Link  . LAPAROSCOPIC LYSIS OF ADHESIONS  11/15/10   Exploratory  . TONSILLECTOMY      reports that she quit smoking about 35 years ago. Her smoking use included Cigarettes. She has a 15.00 pack-year smoking history. She has never used smokeless tobacco. She reports that she drinks about 0.6 oz of alcohol per week . She reports that she does not use drugs. Social History   Social History  . Marital status: Widowed    Spouse name: N/A  . Number of children: N/A  . Years of education: N/A   Occupational History  . Not on file.   Social History Main Topics  . Smoking status: Former Smoker    Packs/day: 0.50    Years: 30.00    Types: Cigarettes    Quit date: 12/16/1980  . Smokeless tobacco: Never Used  . Alcohol use 0.6 oz/week    1 Glasses of wine per week     Comment: occasional glass of wine  . Drug use: No  . Sexual activity: No   Other Topics Concern  . Not on file   Social History Narrative   Lives at [place] 05/2011   Widowed 2003   Living Will   Walks with cane   Never smoked    Exercise none     Functional Status Survey:    Family History  Problem Relation Age of Onset  . Cancer Father 88       colon  . Cancer Maternal Aunt        breast cancer   . Cancer Cousin        breast cancer     Health Maintenance  Topic Date Due  . MAMMOGRAM  02/18/2017  (Originally 10/13/2015)  . PNA vac Low Risk Adult (2 of 2 - PCV13) 02/18/2017 (Originally 02/18/2010)  . INFLUENZA VACCINE  09/18/2016  . TETANUS/TDAP  04/02/2026  . DEXA SCAN  Completed    Allergies  Allergen Reactions  . Sulfa Antibiotics Itching    Outpatient Encounter Prescriptions as of 08/27/2016  Medication Sig  . acetaminophen (TYLENOL) 325 MG tablet Take 650 mg by mouth 2 (two) times daily. Take 2 tablets every 6 hours as needed for mild pain or fever  . alum & mag hydroxide-simeth (MAALOX/MYLANTA) 200-200-20 MG/5ML suspension Take by mouth. 69m every 4 hours as needed for indigestion  . atorvastatin (LIPITOR) 20 MG tablet Take one tablet by mouth once daily for cholesterol  . azithromycin (ZITHROMAX) 250 MG tablet 1 tab po daily x 5 more days, zero refills  . BETIMOL 0.5 % ophthalmic solution Place 1 drop into both eyes every morning.   .Marland Kitchen  carvedilol (COREG) 6.25 MG tablet Take 6.25 mg by mouth 2 (two) times daily.   . cefUROXime (CEFTIN) 250 MG tablet Take 1 tablet (250 mg total) by mouth 2 (two) times daily.  . cloNIDine (CATAPRES) 0.1 MG tablet Take 0.1 mg by mouth 2 (two) times daily.  . Coenzyme Q10 50 MG TABS Take one tablet by mouth at bedtime  . famotidine (PEPCID) 20 MG tablet Take 20 mg by mouth daily.  . furosemide (LASIX) 20 MG tablet Take 20 mg by mouth daily.  . Hypromellose (ARTIFICIAL TEARS OP) Apply 2 drops to eye 2 (two) times daily. Both eyes  . isosorbide mononitrate (IMDUR) 60 MG 24 hr tablet Take 60 mg by mouth at bedtime.   Marland Kitchen KLOR-CON 10 10 MEQ tablet Take 10 mEq by mouth daily.  Marland Kitchen latanoprost (XALATAN) 0.005 % ophthalmic solution Place 1 drop into both eyes at bedtime.   . mirtazapine (REMERON) 7.5 MG tablet TAKE 1 TABLET (7.5 MG TOTAL) BY MOUTH AT BEDTIME. NIGHTLY FOR APPETITE  . vitamin B-12 (CYANOCOBALAMIN) 1000 MCG tablet Take 1 tablet by mouth twice daily On Mondays,Wednesdays and Fridays   No facility-administered encounter medications on file as of  08/27/2016.     Review of Systems  Constitutional: Negative for appetite change, chills, diaphoresis and fever.       Energy level is slowly coming back.  HENT: Positive for hearing loss. Negative for congestion, mouth sores, rhinorrhea, sore throat and trouble swallowing.   Eyes:       Wears glasses  Respiratory: Negative for cough, shortness of breath and wheezing.   Cardiovascular: Negative for chest pain, palpitations and leg swelling.  Gastrointestinal: Negative for abdominal pain, constipation, diarrhea, nausea and vomiting.       Last bowel movement was Sunday   Genitourinary: Negative for dysuria.  Musculoskeletal: Positive for gait problem. Negative for back pain.       Needs 1 person assist with transfers and uses wheelchair to get around the facility  Skin: Negative for rash and wound.  Neurological: Positive for weakness. Negative for dizziness and headaches.  Psychiatric/Behavioral: Positive for confusion. Negative for agitation.    Vitals:   08/27/16 1153  BP: (!) 148/72  Pulse: 80  Resp: 20  Temp: (!) 97.1 F (36.2 C)  TempSrc: Oral  SpO2: 96%  Weight: 138 lb (62.6 kg)  Height: _0  (1.676 m)   Body mass index is 22.27 kg/m. Physical Exam  Constitutional: She appears well-developed and well-nourished. No distress.  HENT:  Head: Normocephalic and atraumatic.  Mouth/Throat: Oropharynx is clear and moist.  Eyes: Conjunctivae and EOM are normal. Pupils are equal, round, and reactive to light.  Neck: Normal range of motion. Neck supple. No JVD present.  Cardiovascular: Normal rate and regular rhythm.   Murmur heard. Pulmonary/Chest: Effort normal and breath sounds normal. No respiratory distress. She has no wheezes. She has no rales.  Abdominal: Soft. Bowel sounds are normal. She exhibits no distension and no mass. There is no tenderness. There is no rebound and no guarding.  Musculoskeletal: Normal range of motion. She exhibits edema and deformity.  Trace  ankle edema, limited ROM with her shoulder, on wheelchair, arthritis changes with her fingers  Lymphadenopathy:    She has no cervical adenopathy.  Neurological: She is alert.  Oriented to self, her family members and place.  Skin: Skin is warm and dry. No rash noted. She is not diaphoretic.  Psychiatric: She has a normal mood and affect.  Labs reviewed: Basic Metabolic Panel:  Recent Labs  08/24/16 1053 08/25/16 0105 08/26/16 0427  NA 140 138 138  K 3.8 3.4* 4.0  CL 109 106 105  CO2 21* 22 21*  GLUCOSE 127* 161* 137*  BUN 35* 41* 64*  CREATININE 2.04* 2.20* 2.90*  CALCIUM 8.8* 8.5* 8.2*  MG  --  1.9  --    Liver Function Tests:  Recent Labs  04/04/16 07/30/16 08/24/16 1053  AST 12* 14 17  ALT 6* 6* 9*  ALKPHOS 38 36 42  BILITOT  --   --  0.9  PROT  --   --  7.1  ALBUMIN  --   --  3.8   No results for input(s): LIPASE, AMYLASE in the last 8760 hours. No results for input(s): AMMONIA in the last 8760 hours. CBC:  Recent Labs  08/24/16 1053 08/25/16 0105 08/26/16 0427  WBC 10.8* 19.8* 12.9*  NEUTROABS 8.2*  --   --   HGB 12.2 11.1* 10.0*  HCT 36.3 33.0* 30.6*  MCV 89.9 88.2 86.4  PLT 186 190 197   Cardiac Enzymes:  Recent Labs  08/24/16 1444 08/24/16 2023 08/25/16 0105  TROPONINI 0.46* 0.31* 0.22*   BNP: Invalid input(s): POCBNP Lab Results  Component Value Date   HGBA1C 5.6 11/06/2014   Lab Results  Component Value Date   TSH 2.21 04/04/2016   Lab Results  Component Value Date   VITAMINB12 1,080 (H) 08/16/2014   No results found for: FOLATE Lab Results  Component Value Date   IRON 36 11/10/2014   TIBC 249 (L) 11/10/2014   FERRITIN 65 11/10/2014    Imaging and Procedures obtained prior to SNF admission: Dg Chest Port 1 View  Result Date: 08/24/2016 CLINICAL DATA:  Patient with worsening shortness of breath. EXAM: PORTABLE CHEST 1 VIEW COMPARISON:  Chest radiograph 03/07/2015. FINDINGS: Monitoring leads overlie the patient.  Patient is mildly rotated to the right. Cardiac contours upper limits of normal. Interval development of patchy consolidation within the right mid lung with associated heterogeneous opacities right lower lung. Small right pleural effusion. Left lung is clear. Bilateral shoulder joint degenerative changes. IMPRESSION: Interval development of consolidation predominately within the right mid lung with heterogeneous right lower lung opacities favored to represent pneumonia in the appropriate clinical setting. Followup PA and lateral chest X-ray is recommended in 3-4 weeks following trial of antibiotic therapy to ensure resolution and exclude underlying malignancy. Small right pleural effusion. Electronically Signed   By: Lovey Newcomer M.D.   On: 08/24/2016 10:53   Dg Swallowing Func-speech Pathology  Result Date: 08/25/2016 Objective Swallowing Evaluation: Type of Study: MBS-Modified Barium Swallow Study Patient Details Name: ASSYRIA MORREALE MRN: [id number] Date of Birth: January 19, 1926 Today's Date: 08/25/2016 Time: SLP Start Time (ACUTE ONLY): 1332-SLP Stop Time (ACUTE ONLY): 1348 SLP Time Calculation (min) (ACUTE ONLY): 16 min Past Medical History: Past Medical History: Diagnosis Date . Arthritis   Osteoarthritis . Cancer of left breast [facility]) August 2016 . Cerebral atherosclerosis 12/19/2012 . Cerebral atrophy 12/19/2012 . Cerebral ischemia 12/19/2012  Microvascular  . CKD (chronic kidney disease) stage 3, GFR 30-59 ml/min 11/03/2014 . Cough  . Dementia  . Diverticulosis of colon (without mention of hemorrhage)  . Edema  . Flat feet  . GERD (gastroesophageal reflux disease)  . Glaucoma  . Heart murmur  . Hyperlipidemia  . Hypertension  . Hypertonicity of bladder  . Ischemic cardiomyopathy 11/04/2014 . Lumbago  . Memory change 03/28/2016 . Osteoarthrosis, unspecified whether generalized or  localized, unspecified site  . Other malaise and fatigue  . Pain in joint, pelvic region and thigh  . Pain in joint, shoulder region  . Personal  history of fall  . RLQ abdominal pain 01/17/2011 . Senile osteoporosis  . Small bowel obstruction ([place])  . STEMI (ST elevation myocardial infarction) ([place]) 11/04/2014 . Unstable gait 11/04/2014 . Urine, incontinence, stress female 09/18/2014 Past Surgical History: Past Surgical History: Procedure Laterality Date . BREAST LUMPECTOMY Bilateral  . BREAST LUMPECTOMY WITH RADIOACTIVE SEED LOCALIZATION Left 03/15/2015  Procedure: LEFT BREAST LUMPECTOMY WITH RADIOACTIVE SEED LOCALIZATION;  Surgeon: Fanny Skates, MD;  Location: [place];  Service: General;  Laterality: Left; . CARDIAC CATHETERIZATION N/A 11/08/2014  Procedure: Left Heart Cath and Coronary Angiography;  Surgeon: [name], MD;  Location: [place] CV LAB;  Service: Cardiovascular;  Laterality: N/A; . COLON SURGERY  02/1996  Colectomy, partial for diverticular bleed 90% . EYE SURGERY   . JOINT REPLACEMENT Left 12/2000  Left knee; Dr. Wynelle Link . LAPAROSCOPIC LYSIS OF ADHESIONS  11/15/10  Exploratory . TONSILLECTOMY   HPI: MAKENSEY REGO is a 83 y.o. female history of dementia, reflux, hyperlipidemia, hypertension, previous breast cancer here presenting with altered mental status, fever, hypoxia.  Found to have right sided PNA.  No Data Recorded Assessment / Plan / Recommendation CHL IP CLINICAL IMPRESSIONS 08/25/2016 Clinical Impression Pt demonstrates normal oropharyngeal swallow function for age. Mild premature spillage of solids to the valleculae while masticating. No penetration or aspiration. No SLP f/u needed will sign off.  SLP Visit Diagnosis Dysphagia, unspecified (R13.10) Attention and concentration deficit following -- Frontal lobe and executive function deficit following -- Impact on safety and function Mild aspiration risk   CHL IP TREATMENT RECOMMENDATION 08/25/2016 Treatment Recommendations No treatment recommended at this time   No flowsheet data found. CHL IP DIET RECOMMENDATION 08/25/2016 SLP Diet Recommendations Regular solids;Thin liquid Liquid  Administration via Cup;Straw Medication Administration Whole meds with liquid Compensations -- Postural Changes Seated upright at 90 degrees   CHL IP OTHER RECOMMENDATIONS 08/25/2016 Recommended Consults -- Oral Care Recommendations Oral care BID Other Recommendations --   CHL IP FOLLOW UP RECOMMENDATIONS 08/25/2016 Follow up Recommendations None   No flowsheet data found.     CHL IP ORAL PHASE 08/25/2016 Oral Phase WFL Oral - Pudding Teaspoon -- Oral - Pudding Cup -- Oral - Honey Teaspoon -- Oral - Honey Cup -- Oral - Nectar Teaspoon -- Oral - Nectar Cup -- Oral - Nectar Straw -- Oral - Thin Teaspoon -- Oral - Thin Cup -- Oral - Thin Straw -- Oral - Puree -- Oral - Mech Soft -- Oral - Regular -- Oral - Multi-Consistency -- Oral - Pill -- Oral Phase - Comment --  CHL IP PHARYNGEAL PHASE 08/25/2016 Pharyngeal Phase WFL Pharyngeal- Pudding Teaspoon -- Pharyngeal -- Pharyngeal- Pudding Cup -- Pharyngeal -- Pharyngeal- Honey Teaspoon -- Pharyngeal -- Pharyngeal- Honey Cup -- Pharyngeal -- Pharyngeal- Nectar Teaspoon -- Pharyngeal -- Pharyngeal- Nectar Cup -- Pharyngeal -- Pharyngeal- Nectar Straw -- Pharyngeal -- Pharyngeal- Thin Teaspoon -- Pharyngeal -- Pharyngeal- Thin Cup -- Pharyngeal -- Pharyngeal- Thin Straw -- Pharyngeal -- Pharyngeal- Puree -- Pharyngeal -- Pharyngeal- Mechanical Soft -- Pharyngeal -- Pharyngeal- Regular -- Pharyngeal -- Pharyngeal- Multi-consistency -- Pharyngeal -- Pharyngeal- Pill -- Pharyngeal -- Pharyngeal Comment --  No flowsheet data found. No flowsheet data found. DeBlois, Katherene Ponto 08/25/2016, 2:33 PM               Assessment/Plan  Physical deconditioning With weakness  from recent hospitalization. Will have her work with physical therapy and occupational therapy team to help with gait training and muscle strengthening exercises.fall precautions. Skin care. Encourage to be out of bed.   HCAP Continue and complete course of azithromycin and cefuroxime on 08/31/16. Aspiration  precautions. SLP consult  ckd stage 4 Lasix on hold until 08/29/16, check bmp  Leukocytosis With recent HCAP. Continue and complete her antibiotic, monitor wbc curve  CHF Recent exacerbation requiring iv diuresis. Continue carvedilol 6.25 mg bid, isosorbide mononitrate 60 mg daily and to resume lasix from 08/29/16. Continue kcl and check bmp. Monitor daily weight for now and adjust diuretics as needed.   Anemia of chronic disease Monitor cbc periodically  Hyperlipidemia Lipid Panel     Component Value Date/Time   CHOL 126 05/28/2016   TRIG 102 05/28/2016   HDL 44 05/28/2016   CHOLHDL 3.1 11/06/2014 0340   VLDL 25 11/06/2014 0340   LDLCALC 63 05/28/2016   LDL at goal. Continue atorvastatin 20 mg daily for now  Right knee OA Continue tylenol 650 mg bid and q6h prn in between. Assistance with transfer.   HTN Continue imdur 60 mg daily, coreg 6.25 mg bid and clonidine 0.1 mg bid. Monitor BP  gerd Controlled symptom, continue famotidine   Family/ staff Communication: reviewed care plan with patient, her daughter/HCPOA and charge nurse.    Labs/tests ordered: cbc with diff, cmp next lab and in 1 week  [name], MD Internal Medicine [facility] [place], [place] [address] Cell Phone (Monday-Friday 8 am - 5 pm): [phone] On Call: [phone] and follow prompts after 5 pm and on weekends Office Phone: [phone] Office Fax: [phone]

## 2016-08-29 ENCOUNTER — Encounter: Payer: Self-pay | Admitting: Internal Medicine

## 2016-08-29 ENCOUNTER — Telehealth: Payer: Self-pay

## 2016-08-29 DIAGNOSIS — G9341 Metabolic encephalopathy: Secondary | ICD-10-CM | POA: Diagnosis not present

## 2016-08-29 DIAGNOSIS — I5043 Acute on chronic combined systolic (congestive) and diastolic (congestive) heart failure: Secondary | ICD-10-CM | POA: Diagnosis not present

## 2016-08-29 DIAGNOSIS — D631 Anemia in chronic kidney disease: Secondary | ICD-10-CM | POA: Diagnosis not present

## 2016-08-29 DIAGNOSIS — J168 Pneumonia due to other specified infectious organisms: Secondary | ICD-10-CM | POA: Diagnosis not present

## 2016-08-29 LAB — CULTURE, BLOOD (ROUTINE X 2)
CULTURE: NO GROWTH
Culture: NO GROWTH
SPECIAL REQUESTS: ADEQUATE
Special Requests: ADEQUATE

## 2016-08-29 NOTE — Telephone Encounter (Signed)
Possible re-admission to facility. This is a patient you were seeing at [facility]. [facility] F/U is needed if patient was re-admitted to facility upon discharge. Hospital discharge from [facility] on 08/26/16

## 2016-08-29 NOTE — Patient Instructions (Signed)
Opened in error

## 2016-08-29 NOTE — Progress Notes (Signed)
Opened in error       Review of Systems  Constitutional: Negative for appetite change, chills, diaphoresis and fever.       Energy level is slowly coming back.  HENT: Positive for hearing loss. Negative for congestion, mouth sores, rhinorrhea, sore throat and trouble swallowing.   Eyes:       Wears glasses  Respiratory: Negative for cough, shortness of breath and wheezing.   Cardiovascular: Negative for chest pain, palpitations and leg swelling.  Gastrointestinal: Negative for abdominal pain, constipation, diarrhea, nausea and vomiting.       Last bowel movement was Sunday   Genitourinary: Negative for dysuria.  Musculoskeletal: Positive for gait problem. Negative for back pain.       Needs 1 person assist with transfers and uses wheelchair to get around the facility  Skin: Negative for rash and wound.  Neurological: Positive for weakness. Negative for dizziness and headaches.  Psychiatric/Behavioral: Positive for confusion. Negative for agitation.   Physical Exam  Constitutional: She appears well-developed and well-nourished. No distress.  HENT:  Head: Normocephalic and atraumatic.  Mouth/Throat: Oropharynx is clear and moist.  Eyes: Pupils are equal, round, and reactive to light. Conjunctivae and EOM are normal.  Neck: Normal range of motion. Neck supple. No JVD present.  Cardiovascular: Normal rate and regular rhythm.   Murmur heard. Pulmonary/Chest: Effort normal and breath sounds normal. No respiratory distress. She has no wheezes. She has no rales.  Abdominal: Soft. Bowel sounds are normal. She exhibits no distension and no mass. There is no tenderness. There is no rebound and no guarding.  Musculoskeletal: Normal range of motion. She exhibits edema and deformity.  Trace ankle edema, limited ROM with her shoulder, on wheelchair, arthritis changes with her fingers  Lymphadenopathy:    She has no cervical adenopathy.  Neurological: She is alert.  Oriented to self, her  family members and place.  Skin: Skin is warm and dry. No rash noted. She is not diaphoretic.  Psychiatric: She has a normal mood and affect.

## 2016-09-03 ENCOUNTER — Other Ambulatory Visit: Payer: Self-pay | Admitting: *Deleted

## 2016-09-03 DIAGNOSIS — N183 Chronic kidney disease, stage 3 (moderate): Secondary | ICD-10-CM | POA: Diagnosis not present

## 2016-09-03 DIAGNOSIS — I5043 Acute on chronic combined systolic (congestive) and diastolic (congestive) heart failure: Secondary | ICD-10-CM | POA: Diagnosis not present

## 2016-09-03 DIAGNOSIS — D631 Anemia in chronic kidney disease: Secondary | ICD-10-CM | POA: Diagnosis not present

## 2016-09-03 DIAGNOSIS — I10 Essential (primary) hypertension: Secondary | ICD-10-CM | POA: Diagnosis not present

## 2016-09-03 LAB — BASIC METABOLIC PANEL
BUN: 42 — AB (ref 4–21)
Creatinine: 2.4 — AB (ref <=1.1)
Glucose: 80
Potassium: 4.2 (ref 3.4–5.3)
SODIUM: 141 (ref 137–147)

## 2016-09-05 DIAGNOSIS — R609 Edema, unspecified: Secondary | ICD-10-CM | POA: Diagnosis not present

## 2016-09-05 DIAGNOSIS — I10 Essential (primary) hypertension: Secondary | ICD-10-CM | POA: Diagnosis not present

## 2016-09-05 LAB — BASIC METABOLIC PANEL
BUN: 33 — AB (ref 4–21)
Creatinine: 2.5 — AB (ref 0.5–1.1)
GLUCOSE: 79
Potassium: 4.2 (ref 3.4–5.3)
SODIUM: 142 (ref 137–147)

## 2016-09-05 LAB — CBC AND DIFFERENTIAL
HEMATOCRIT: 27 — AB (ref 36–46)
HEMOGLOBIN: 9.1 — AB (ref 12.0–16.0)
Platelets: 195 (ref 150–399)
WBC: 9.3

## 2016-09-06 ENCOUNTER — Encounter: Payer: Self-pay | Admitting: Internal Medicine

## 2016-09-06 ENCOUNTER — Non-Acute Institutional Stay (SKILLED_NURSING_FACILITY): Payer: Medicare Other | Admitting: Internal Medicine

## 2016-09-06 DIAGNOSIS — D72829 Elevated white blood cell count, unspecified: Secondary | ICD-10-CM

## 2016-09-06 DIAGNOSIS — I5032 Chronic diastolic (congestive) heart failure: Secondary | ICD-10-CM

## 2016-09-06 DIAGNOSIS — N184 Chronic kidney disease, stage 4 (severe): Secondary | ICD-10-CM | POA: Diagnosis not present

## 2016-09-06 DIAGNOSIS — D631 Anemia in chronic kidney disease: Secondary | ICD-10-CM | POA: Diagnosis not present

## 2016-09-06 NOTE — Progress Notes (Signed)
Provider:  [name] MD  Location:  [place] Room Number: 5 Place of Service:  SNF (31)  PCP: [name], MD Patient Care Team: [name], MD as PCP - General (Internal Medicine) Juanita Craver, MD as Consulting Physician (Gastroenterology) Shon Hough, MD as Consulting Physician (Ophthalmology) Thornell Sartorius, MD as Consulting Physician (Otolaryngology) [place], [place] Mast, Man X, NP as Nurse Practitioner (Nurse Practitioner) Bo Merino, MD as Consulting Physician (Rheumatology) Suella Broad, MD as Consulting Physician (Physical Medicine and Rehabilitation) Netta Cedars, MD as Consulting Physician (Orthopedic Surgery) Fanny Skates, MD as Consulting Physician (General Surgery) Truitt Merle, MD as Consulting Physician (Hematology) Thea Silversmith, MD (Inactive) as Consulting Physician (Radiation Oncology) Rockwell Germany, RN as Registered Nurse Mauro Kaufmann, RN as Registered Nurse Jake Shark, Johny Blamer, NP as Nurse Practitioner (Nurse Practitioner) [name], MD as Consulting Physician (Cardiology)  Extended Emergency Contact Information Primary Emergency Contact: [name],[name] Address: [address]          [place] [address] John[place]te Litter of [place] Phone: [phone] Mobile Phone: [phone] Relation: Daughter Secondary Emergency Contact: Mort Sawyers [place] of [place] Phone: [phone] Relation: None  Code Status: DNR Goals of Care: Advanced Directive information Advanced Directives 08/24/2016  Does Patient Have a Medical Advance Directive? Yes  Type of Advance Directive Out of facility DNR (pink MOST or yellow form)  Does patient want to make changes to medical advance directive? No - Patient declined  Copy of [place] in Chart? -  Pre-existing out of facility DNR order (yellow form or pink MOST form) Yellow form placed in chart (order  not valid for inpatient use)      Chief Complaint  Patient presents with  . Acute Visit    Abnormal renal function and drop in hemoglobin    HPI:  Patient is a 83 y.o. female seen today for acute visit for abnormal labs. She was recently in the hospital with acute respiratory failure from chf exacerbation and pneumonia. She has completed her antibiotic. She has history of chronic kidney disease.   Past Medical History:  Diagnosis Date  . Arthritis    Osteoarthritis  . Cancer of left breast [facility]) August 2016  . Cerebral atherosclerosis 12/19/2012  . Cerebral atrophy 12/19/2012  . Cerebral ischemia 12/19/2012   Microvascular   . CKD (chronic kidney disease) stage 3, GFR 30-59 ml/min 11/03/2014  . Cough   . Dementia   . Diverticulosis of colon (without mention of hemorrhage)   . Edema   . Flat feet   . GERD (gastroesophageal reflux disease)   . Glaucoma   . Heart murmur   . Hyperlipidemia   . Hypertension   . Hypertonicity of bladder   . Ischemic cardiomyopathy 11/04/2014  . Lumbago   . Memory change 03/28/2016  . Osteoarthrosis, unspecified whether generalized or localized, unspecified site   . Other malaise and fatigue   . Pain in joint, pelvic region and thigh   . Pain in joint, shoulder region   . Personal history of fall   . RLQ abdominal pain 01/17/2011  . Senile osteoporosis   . Small bowel obstruction ([place])   . STEMI (ST elevation myocardial infarction) ([place]) 11/04/2014  . Unstable gait 11/04/2014  . Urine, incontinence, stress female 09/18/2014   Past Surgical History:  Procedure Laterality Date  . BREAST LUMPECTOMY Bilateral   . BREAST LUMPECTOMY WITH RADIOACTIVE SEED LOCALIZATION Left 03/15/2015   Procedure: LEFT BREAST LUMPECTOMY WITH  RADIOACTIVE SEED LOCALIZATION;  Surgeon: Fanny Skates, MD;  Location: [place];  Service: General;  Laterality: Left;  . CARDIAC CATHETERIZATION N/A 11/08/2014   Procedure: Left Heart Cath and Coronary Angiography;  Surgeon: [name], MD;  Location: [place] CV LAB;  Service: Cardiovascular;  Laterality: N/A;  . COLON SURGERY  02/1996   Colectomy, partial for diverticular bleed 90%  . EYE SURGERY    . JOINT REPLACEMENT Left 12/2000   Left knee; Dr. Wynelle Link  . LAPAROSCOPIC LYSIS OF ADHESIONS  11/15/10   Exploratory  . TONSILLECTOMY      reports that she quit smoking about 35 years ago. Her smoking use included Cigarettes. She has a 15.00 pack-year smoking history. She has never used smokeless tobacco. She reports that she drinks about 0.6 oz of alcohol per week . She reports that she does not use drugs. Social History   Social History  . Marital status: Widowed    Spouse name: N/A  . Number of children: N/A  . Years of education: N/A   Occupational History  . Not on file.   Social History Main Topics  . Smoking status: Former Smoker    Packs/day: 0.50    Years: 30.00    Types: Cigarettes    Quit date: 12/16/1980  . Smokeless tobacco: Never Used  . Alcohol use 0.6 oz/week    1 Glasses of wine per week     Comment: occasional glass of wine  . Drug use: No  . Sexual activity: No   Other Topics Concern  . Not on file   Social History Narrative   Lives at [facility] 05/2011   Widowed 2003   Living Will   Walks with cane   Never smoked    Exercise none     Functional Status Survey:    Family History  Problem Relation Age of Onset  . Cancer Father 29       colon  . Cancer Maternal Aunt        breast cancer   . Cancer Cousin        breast cancer     Health Maintenance  Topic Date Due  . MAMMOGRAM  02/18/2017 (Originally 10/13/2015)  . PNA vac Low Risk Adult (2 of 2 - PCV13) 02/18/2017 (Originally 02/18/2010)  . INFLUENZA VACCINE  09/18/2016  . TETANUS/TDAP  04/02/2026  . DEXA SCAN  Completed    Allergies  Allergen Reactions  . Sulfa Antibiotics Itching    Outpatient Encounter Prescriptions as of 09/06/2016  Medication Sig  . acetaminophen (TYLENOL) 325 MG tablet  Take 650 mg by mouth 2 (two) times daily. Take 2 tablets every 6 hours as needed for mild pain or fever  . alum & mag hydroxide-simeth (MAALOX/MYLANTA) 200-200-20 MG/5ML suspension Take by mouth. 72ml every 4 hours as needed for indigestion  . atorvastatin (LIPITOR) 20 MG tablet Take one tablet by mouth once daily for cholesterol  . BETIMOL 0.5 % ophthalmic solution Place 1 drop into both eyes every morning.   . carvedilol (COREG) 6.25 MG tablet Take 6.25 mg by mouth 2 (two) times daily.   . cloNIDine (CATAPRES) 0.1 MG tablet Take 0.1 mg by mouth 2 (two) times daily.  . Coenzyme Q10 50 MG TABS Take one tablet by mouth at bedtime  . famotidine (PEPCID) 20 MG tablet Take 20 mg by mouth daily.  . furosemide (LASIX) 20 MG tablet Take 20 mg by mouth daily.  . Hypromellose (ARTIFICIAL TEARS OP) Apply  2 drops to eye 2 (two) times daily. Both eyes  . isosorbide mononitrate (IMDUR) 60 MG 24 hr tablet Take 60 mg by mouth at bedtime.   Marland Kitchen KLOR-CON 10 10 MEQ tablet Take 10 mEq by mouth daily.  Marland Kitchen latanoprost (XALATAN) 0.005 % ophthalmic solution Place 1 drop into both eyes at bedtime.   . mirtazapine (REMERON) 7.5 MG tablet TAKE 1 TABLET (7.5 MG TOTAL) BY MOUTH AT BEDTIME. NIGHTLY FOR APPETITE  . vitamin B-12 (CYANOCOBALAMIN) 1000 MCG tablet Take 1 tablet by mouth twice daily On Mondays,Wednesdays and Fridays  . [DISCONTINUED] azithromycin (ZITHROMAX) 250 MG tablet 1 tab po daily x 5 more days, zero refills  . [DISCONTINUED] saccharomyces boulardii (FLORASTOR) 250 MG capsule Take 250 mg by mouth 2 (two) times daily. Stop date 09/03/16   No facility-administered encounter medications on file as of 09/06/2016.     Review of Systems  Constitutional: Negative for appetite change, chills, diaphoresis, fatigue and fever.  HENT: Positive for hearing loss. Negative for congestion, mouth sores, rhinorrhea, sore throat and trouble swallowing.   Eyes:       Wears glasses  Respiratory: Negative for cough, shortness of  breath and wheezing.   Cardiovascular: Negative for chest pain, palpitations and leg swelling.  Gastrointestinal: Negative for abdominal pain, blood in stool, constipation, diarrhea, nausea and vomiting.          Genitourinary: Negative for dysuria and hematuria.  Musculoskeletal: Positive for gait problem. Negative for back pain.       Needs 1 person assist with transfers and uses wheelchair to get around the facility  Skin: Negative for rash and wound.  Neurological: Positive for weakness. Negative for dizziness and headaches.  Psychiatric/Behavioral: Positive for confusion. Negative for agitation.    Vitals:   09/06/16 0912  BP: 132/76  Pulse: 78  Resp: 18  Temp: 97.8 F (36.6 C)  TempSrc: Oral  Weight: 140 lb 6.4 oz (63.7 kg)  Height: 5\' 6"  (1.676 m)   Body mass index is 22.66 kg/m. Physical Exam  Constitutional: She appears well-developed and well-nourished. No distress.  HENT:  Head: Normocephalic and atraumatic.  Mouth/Throat: Oropharynx is clear and moist.  Eyes: Pupils are equal, round, and reactive to light. Conjunctivae and EOM are normal.  Neck: Normal range of motion. Neck supple. No JVD present.  Cardiovascular: Normal rate and regular rhythm.   Murmur heard. Pulmonary/Chest: Effort normal and breath sounds normal. No respiratory distress. She has no wheezes. She has no rales.  Abdominal: Soft. Bowel sounds are normal. She exhibits no distension and no mass. There is no tenderness. There is no rebound and no guarding.  Musculoskeletal: Normal range of motion. She exhibits edema and deformity.  Trace ankle edema, limited ROM with her shoulder, on wheelchair, arthritis changes with her fingers  Lymphadenopathy:    She has no cervical adenopathy.  Neurological: She is alert.  Oriented to self, her family members and place.  Skin: Skin is warm and dry. No rash noted. She is not diaphoretic. No pallor.  Psychiatric: She has a normal mood and affect.    Labs  reviewed: Basic Metabolic Panel:  Recent Labs  08/24/16 1053 08/25/16 0105 08/26/16 0427 09/03/16 09/05/16  NA 140 138 138 141 142  K 3.8 3.4* 4.0 4.2 4.2  CL 109 106 105  --   --   CO2 21* 22 21*  --   --   GLUCOSE 127* 161* 137*  --   --   BUN 35* 41*  64* 42* 33*  CREATININE 2.04* 2.20* 2.90* 2.4* 2.5*  CALCIUM 8.8* 8.5* 8.2*  --   --   MG  --  1.9  --   --   --    Liver Function Tests:  Recent Labs  04/04/16 07/30/16 08/24/16 1053  AST 12* 15  14 17   ALT 6* 19  6* 9*  ALKPHOS 38 33  36 42  BILITOT  --   --  0.9  PROT  --   --  7.1  ALBUMIN  --   --  3.8   No results for input(s): LIPASE, AMYLASE in the last 8760 hours. No results for input(s): AMMONIA in the last 8760 hours. CBC:  Recent Labs  08/24/16 1053 08/25/16 0105 08/26/16 0427 09/05/16  WBC 10.8* 19.8* 12.9* 9.3  NEUTROABS 8.2*  --   --   --   HGB 12.2 11.1* 10.0* 9.1*  HCT 36.3 33.0* 30.6* 27*  MCV 89.9 88.2 86.4  --   PLT 186 190 197 195   Cardiac Enzymes:  Recent Labs  08/24/16 1444 08/24/16 2023 08/25/16 0105  TROPONINI 0.46* 0.31* 0.22*   BNP: Invalid input(s): POCBNP Lab Results  Component Value Date   HGBA1C 5.6 11/06/2014   Lab Results  Component Value Date   TSH 2.21 04/04/2016   Lab Results  Component Value Date   VITAMINB12 1,080 (H) 08/16/2014   No results found for: FOLATE Lab Results  Component Value Date   IRON 36 11/10/2014   TIBC 249 (L) 11/10/2014   FERRITIN 65 11/10/2014    Imaging and Procedures obtained prior to SNF admission: Dg Chest Port 1 View  Result Date: 08/24/2016 CLINICAL DATA:  Patient with worsening shortness of breath. EXAM: PORTABLE CHEST 1 VIEW COMPARISON:  Chest radiograph 03/07/2015. FINDINGS: Monitoring leads overlie the patient. Patient is mildly rotated to the right. Cardiac contours upper limits of normal. Interval development of patchy consolidation within the right mid lung with associated heterogeneous opacities right lower lung.  Small right pleural effusion. Left lung is clear. Bilateral shoulder joint degenerative changes. IMPRESSION: Interval development of consolidation predominately within the right mid lung with heterogeneous right lower lung opacities favored to represent pneumonia in the appropriate clinical setting. Followup PA and lateral chest X-ray is recommended in 3-4 weeks following trial of antibiotic therapy to ensure resolution and exclude underlying malignancy. Small right pleural effusion. Electronically Signed   By: Lovey Newcomer M.D.   On: 08/24/2016 10:53   Dg Swallowing Func-speech Pathology  Result Date: 08/25/2016 Objective Swallowing Evaluation: Type of Study: MBS-Modified Barium Swallow Study Patient Details Name: SHIVANGI LUTZ MRN: [id number] Date of Birth: April 21, 1925 Today's Date: 08/25/2016 Time: SLP Start Time (ACUTE ONLY): 1332-SLP Stop Time (ACUTE ONLY): 1348 SLP Time Calculation (min) (ACUTE ONLY): 16 min Past Medical History: Past Medical History: Diagnosis Date . Arthritis   Osteoarthritis . Cancer of left breast [facility]) August 2016 . Cerebral atherosclerosis 12/19/2012 . Cerebral atrophy 12/19/2012 . Cerebral ischemia 12/19/2012  Microvascular  . CKD (chronic kidney disease) stage 3, GFR 30-59 ml/min 11/03/2014 . Cough  . Dementia  . Diverticulosis of colon (without mention of hemorrhage)  . Edema  . Flat feet  . GERD (gastroesophageal reflux disease)  . Glaucoma  . Heart murmur  . Hyperlipidemia  . Hypertension  . Hypertonicity of bladder  . Ischemic cardiomyopathy 11/04/2014 . Lumbago  . Memory change 03/28/2016 . Osteoarthrosis, unspecified whether generalized or localized, unspecified site  . Other malaise and fatigue  .  Pain in joint, pelvic region and thigh  . Pain in joint, shoulder region  . Personal history of fall  . RLQ abdominal pain 01/17/2011 . Senile osteoporosis  . Small bowel obstruction ([place])  . STEMI (ST elevation myocardial infarction) ([place]) 11/04/2014 . Unstable gait 11/04/2014 . Urine,  incontinence, stress female 09/18/2014 Past Surgical History: Past Surgical History: Procedure Laterality Date . BREAST LUMPECTOMY Bilateral  . BREAST LUMPECTOMY WITH RADIOACTIVE SEED LOCALIZATION Left 03/15/2015  Procedure: LEFT BREAST LUMPECTOMY WITH RADIOACTIVE SEED LOCALIZATION;  Surgeon: Fanny Skates, MD;  Location: [place];  Service: General;  Laterality: Left; . CARDIAC CATHETERIZATION N/A 11/08/2014  Procedure: Left Heart Cath and Coronary Angiography;  Surgeon: [name], MD;  Location: [place] CV LAB;  Service: Cardiovascular;  Laterality: N/A; . COLON SURGERY  02/1996  Colectomy, partial for diverticular bleed 90% . EYE SURGERY   . JOINT REPLACEMENT Left 12/2000  Left knee; Dr. Wynelle Link . LAPAROSCOPIC LYSIS OF ADHESIONS  11/15/10  Exploratory . TONSILLECTOMY   HPI: SAFIYYAH VASCONEZ is a 83 y.o. female history of dementia, reflux, hyperlipidemia, hypertension, previous breast cancer here presenting with altered mental status, fever, hypoxia.  Found to have right sided PNA.  No Data Recorded Assessment / Plan / Recommendation CHL IP CLINICAL IMPRESSIONS 08/25/2016 Clinical Impression Pt demonstrates normal oropharyngeal swallow function for age. Mild premature spillage of solids to the valleculae while masticating. No penetration or aspiration. No SLP f/u needed will sign off.  SLP Visit Diagnosis Dysphagia, unspecified (R13.10) Attention and concentration deficit following -- Frontal lobe and executive function deficit following -- Impact on safety and function Mild aspiration risk   CHL IP TREATMENT RECOMMENDATION 08/25/2016 Treatment Recommendations No treatment recommended at this time   No flowsheet data found. CHL IP DIET RECOMMENDATION 08/25/2016 SLP Diet Recommendations Regular solids;Thin liquid Liquid Administration via Cup;Straw Medication Administration Whole meds with liquid Compensations -- Postural Changes Seated upright at 90 degrees   CHL IP OTHER RECOMMENDATIONS 08/25/2016 Recommended Consults --  Oral Care Recommendations Oral care BID Other Recommendations --   CHL IP FOLLOW UP RECOMMENDATIONS 08/25/2016 Follow up Recommendations None   No flowsheet data found.     CHL IP ORAL PHASE 08/25/2016 Oral Phase WFL Oral - Pudding Teaspoon -- Oral - Pudding Cup -- Oral - Honey Teaspoon -- Oral - Honey Cup -- Oral - Nectar Teaspoon -- Oral - Nectar Cup -- Oral - Nectar Straw -- Oral - Thin Teaspoon -- Oral - Thin Cup -- Oral - Thin Straw -- Oral - Puree -- Oral - Mech Soft -- Oral - Regular -- Oral - Multi-Consistency -- Oral - Pill -- Oral Phase - Comment --  CHL IP PHARYNGEAL PHASE 08/25/2016 Pharyngeal Phase WFL Pharyngeal- Pudding Teaspoon -- Pharyngeal -- Pharyngeal- Pudding Cup -- Pharyngeal -- Pharyngeal- Honey Teaspoon -- Pharyngeal -- Pharyngeal- Honey Cup -- Pharyngeal -- Pharyngeal- Nectar Teaspoon -- Pharyngeal -- Pharyngeal- Nectar Cup -- Pharyngeal -- Pharyngeal- Nectar Straw -- Pharyngeal -- Pharyngeal- Thin Teaspoon -- Pharyngeal -- Pharyngeal- Thin Cup -- Pharyngeal -- Pharyngeal- Thin Straw -- Pharyngeal -- Pharyngeal- Puree -- Pharyngeal -- Pharyngeal- Mechanical Soft -- Pharyngeal -- Pharyngeal- Regular -- Pharyngeal -- Pharyngeal- Multi-consistency -- Pharyngeal -- Pharyngeal- Pill -- Pharyngeal -- Pharyngeal Comment --  No flowsheet data found. No flowsheet data found. DeBlois, Katherene Ponto 08/25/2016, 2:33 PM               Assessment/Plan  Anemia Has anemia of chronic disease but has had further drop in Hb. No bleed  reported. She was on cefuroxime until 08/31/16 and this can cause drop in Hct.   ckd stage 4 GFR 17. Currently on lasix 20 mg daily. With her poor renal function, d/c lasix and start torsemide 10 mg daily. Monitor BP and urine output.   Leukocytosis Resolved. Likely in setting of HCAP.   CHF Recent exacerbation requiring iv diuresis and hospitalization. Becoming difficult to continue furosemide with worsening renal function and ckd stage 4. She has gained weight. Will  discontinue furosemide and start torsemide 10 mg daily to help with fluid load. Continue carvedilol 6.25 mg bid, isosorbide mononitrate 60 mg daily. Monitor BP and weight. Wt Readings from Last 3 Encounters:  09/06/16 140 lb 6.4 oz (63.7 kg)  08/27/16 138 lb (62.6 kg)  08/26/16 136 lb 3.9 oz (61.8 kg)      Family/ staff Communication: reviewed care plan with patient and charge nurse.    Labs/tests ordered: cbc, bmp 1 week.   [name], MD Internal Medicine [facility] [address] [place], [place] [address] Cell Phone (Monday-Friday 8 am - 5 pm): [phone] On Call: [phone] and follow prompts after 5 pm and on weekends Office Phone: [phone] Office Fax: [phone]

## 2016-09-10 ENCOUNTER — Non-Acute Institutional Stay (SKILLED_NURSING_FACILITY): Payer: Medicare Other | Admitting: Nurse Practitioner

## 2016-09-10 ENCOUNTER — Other Ambulatory Visit: Payer: Self-pay | Admitting: *Deleted

## 2016-09-10 ENCOUNTER — Encounter: Payer: Self-pay | Admitting: Nurse Practitioner

## 2016-09-10 DIAGNOSIS — K219 Gastro-esophageal reflux disease without esophagitis: Secondary | ICD-10-CM

## 2016-09-10 DIAGNOSIS — M25561 Pain in right knee: Secondary | ICD-10-CM | POA: Diagnosis not present

## 2016-09-10 DIAGNOSIS — N189 Chronic kidney disease, unspecified: Secondary | ICD-10-CM | POA: Diagnosis not present

## 2016-09-10 DIAGNOSIS — D631 Anemia in chronic kidney disease: Secondary | ICD-10-CM | POA: Diagnosis not present

## 2016-09-10 DIAGNOSIS — I5021 Acute systolic (congestive) heart failure: Secondary | ICD-10-CM | POA: Diagnosis not present

## 2016-09-10 DIAGNOSIS — J810 Acute pulmonary edema: Secondary | ICD-10-CM | POA: Diagnosis not present

## 2016-09-10 DIAGNOSIS — G8929 Other chronic pain: Secondary | ICD-10-CM

## 2016-09-10 LAB — BASIC METABOLIC PANEL
BUN: 33 — AB (ref 4–21)
CREATININE: 2.7 — AB (ref <=1.1)
Potassium: 4.1 (ref 3.4–5.3)
Sodium: 140 (ref 137–147)

## 2016-09-10 LAB — CBC AND DIFFERENTIAL
HEMATOCRIT: 27 — AB (ref 36–46)
HEMOGLOBIN: 8.7 — AB (ref 12.0–16.0)
PLATELETS: 178 (ref 150–399)
WBC: 6

## 2016-09-10 NOTE — Progress Notes (Signed)
Location:  Between Room Number: 5 Place of Service:  SNF (31) Provider:  Anakin Varkey, Manxie  NP  [name], MD  Patient Care Team: [name], MD as PCP - General (Internal Medicine) Juanita Craver, MD as Consulting Physician (Gastroenterology) Shon Hough, MD as Consulting Physician (Ophthalmology) Thornell Sartorius, MD as Consulting Physician (Otolaryngology) [place] Chany Woolworth X, NP as Nurse Practitioner (Nurse Practitioner) Bo Merino, MD as Consulting Physician (Rheumatology) Suella Broad, MD as Consulting Physician (Physical Medicine and Rehabilitation) Netta Cedars, MD as Consulting Physician (Orthopedic Surgery) Fanny Skates, MD as Consulting Physician (General Surgery) Truitt Merle, MD as Consulting Physician (Hematology) Thea Silversmith, MD (Inactive) as Consulting Physician (Radiation Oncology) Rockwell Germany, RN as Registered Nurse Mauro Kaufmann, RN as Registered Nurse Jake Shark, Johny Blamer, NP as Nurse Practitioner (Nurse Practitioner) [name], MD as Consulting Physician (Cardiology)  Extended Emergency Contact Information Primary Emergency Contact: [name],[name] Address: [address]          [place] [address] [place] Phone: [phone] Mobile Phone: [phone] Relation: Daughter Secondary Emergency Contact: [name],[name]  [place] Mobile Phone: [phone] Relation: None  Code Status:  DNR Goals of care: Advanced Directive information Advanced Directives 09/10/2016  Does Patient Have a Medical Advance Directive? Yes  Type of Advance Directive Out of facility DNR (pink MOST or yellow form)  Does patient want to make changes to medical advance directive? No - Patient declined  Copy of [place] in Chart? Yes  Pre-existing out of facility DNR order (yellow form or pink MOST form) Yellow form placed in chart (order not  valid for inpatient use)     Chief Complaint  Patient presents with  . Acute Visit    Hematcrit low    HPI:  Pt is a 83 y.o. female seen today for an acute visit for anemia, 04/04/16 Hgb 10.4, plt 177,  7/241/8 Hgb 8.7, no apparent bleed, denied abd pain, nausea, vomiting, or tarry stools.  She takes Vit B12 1047mcg po daily.     Hospitalized 08/24/16 to 08/26/16 for acute on chronic CHF, PNA(CXR showed right sided pneumonia), sepsis, improved on antibiotics, steroids, diuretics.     Hx of CKD, creat 2.7 09/10/16, her baseline creat 2-3. GERD, taking Famotidine 20mg  daily. Chronic right knee, wearing knee brace, taking Tylenol 650mg  bid.      Past Medical History:  Diagnosis Date  . Arthritis    Osteoarthritis  . Cancer of left breast [facility]) August 2016  . Cerebral atherosclerosis 12/19/2012  . Cerebral atrophy 12/19/2012  . Cerebral ischemia 12/19/2012   Microvascular   . CKD (chronic kidney disease) stage 3, GFR 30-59 ml/min 11/03/2014  . Cough   . Dementia   . Diverticulosis of colon (without mention of hemorrhage)   . Edema   . Flat feet   . GERD (gastroesophageal reflux disease)   . Glaucoma   . Heart murmur   . Hyperlipidemia   . Hypertension   . Hypertonicity of bladder   . Ischemic cardiomyopathy 11/04/2014  . Lumbago   . Memory change 03/28/2016  . Osteoarthrosis, unspecified whether generalized or localized, unspecified site   . Other malaise and fatigue   . Pain in joint, pelvic region and thigh   . Pain in joint, shoulder region   . Personal history of fall   . RLQ abdominal pain 01/17/2011  . Senile osteoporosis   . Small bowel obstruction ([place])   .  STEMI (ST elevation myocardial infarction) ([place]) 11/04/2014  . Unstable gait 11/04/2014  . Urine, incontinence, stress female 09/18/2014   Past Surgical History:  Procedure Laterality Date  . BREAST LUMPECTOMY Bilateral   . BREAST LUMPECTOMY WITH RADIOACTIVE SEED LOCALIZATION Left 03/15/2015   Procedure: LEFT  BREAST LUMPECTOMY WITH RADIOACTIVE SEED LOCALIZATION;  Surgeon: Fanny Skates, MD;  Location: [place];  Service: General;  Laterality: Left;  . CARDIAC CATHETERIZATION N/A 11/08/2014   Procedure: Left Heart Cath and Coronary Angiography;  Surgeon: [name], MD;  Location: [place] [facility] LAB;  Service: Cardiovascular;  Laterality: N/A;  . COLON SURGERY  02/1996   Colectomy, partial for diverticular bleed 90%  . EYE SURGERY    . JOINT REPLACEMENT Left 12/2000   Left knee; Dr. Wynelle Link  . LAPAROSCOPIC LYSIS OF ADHESIONS  11/15/10   Exploratory  . TONSILLECTOMY      Allergies  Allergen Reactions  . Sulfa Antibiotics Itching    Outpatient Encounter Prescriptions as of 09/10/2016  Medication Sig  . acetaminophen (TYLENOL) 325 MG tablet Take 650 mg by mouth 2 (two) times daily. Take 2 tablets every 6 hours as needed for mild pain or fever  . alum & mag hydroxide-simeth (MAALOX/MYLANTA) 200-200-20 MG/5ML suspension Take by mouth. 69ml every 4 hours as needed for indigestion  . atorvastatin (LIPITOR) 20 MG tablet Take one tablet by mouth once daily for cholesterol  . BETIMOL 0.5 % ophthalmic solution Place 1 drop into both eyes every morning.   . carvedilol (COREG) 6.25 MG tablet Take 6.25 mg by mouth 2 (two) times daily.   . cloNIDine (CATAPRES) 0.1 MG tablet Take 0.1 mg by mouth 2 (two) times daily.  . Coenzyme Q10 50 MG TABS Take one tablet by mouth at bedtime  . famotidine (PEPCID) 20 MG tablet Take 20 mg by mouth daily.  . Hypromellose (ARTIFICIAL TEARS OP) Apply 2 drops to eye 2 (two) times daily. Both eyes  . isosorbide mononitrate (IMDUR) 60 MG 24 hr tablet Take 60 mg by mouth at bedtime.   Marland Kitchen KLOR-CON 10 10 MEQ tablet Take 10 mEq by mouth daily.  Marland Kitchen latanoprost (XALATAN) 0.005 % ophthalmic solution Place 1 drop into both eyes at bedtime.   . mirtazapine (REMERON) 7.5 MG tablet TAKE 1 TABLET (7.5 MG TOTAL) BY MOUTH AT BEDTIME. NIGHTLY FOR APPETITE  . vitamin B-12 (CYANOCOBALAMIN) 1000  MCG tablet Take 1 tablet by mouth twice daily On Mondays,Wednesdays and Fridays  . [DISCONTINUED] furosemide (LASIX) 20 MG tablet Take 20 mg by mouth daily.   No facility-administered encounter medications on file as of 09/10/2016.     Review of Systems  Constitutional: Negative for activity change, appetite change and fatigue.  HENT: Positive for hearing loss. Negative for congestion, rhinorrhea and trouble swallowing.   Eyes:       Wears glasses  Respiratory: Negative for cough and shortness of breath.   Cardiovascular: Negative for chest pain, palpitations and leg swelling.  Gastrointestinal: Negative for abdominal pain, constipation, diarrhea, nausea and vomiting.          Genitourinary: Negative for dysuria and frequency.  Musculoskeletal: Positive for gait problem. Negative for back pain.       W/c for mobility.   Skin: Negative for rash and wound.  Neurological: Positive for weakness. Negative for dizziness and headaches.  Psychiatric/Behavioral: Positive for confusion. Negative for agitation. The patient is not nervous/anxious.     Immunization History  Administered Date(s) Administered  . Influenza Whole 11/19/2011, 12/02/2012  .  Influenza-Unspecified 12/06/2013, 12/06/2015  . PPD Test 11/10/2014  . Pneumococcal Polysaccharide-23 02/18/2009  . Tdap 04/02/2016   Pertinent  Health Maintenance Due  Topic Date Due  . MAMMOGRAM  02/18/2017 (Originally 10/13/2015)  . PNA vac Low Risk Adult (2 of 2 - PCV13) 02/18/2017 (Originally 02/18/2010)  . INFLUENZA VACCINE  09/18/2016  . DEXA SCAN  Completed   Fall Risk  07/13/2015 11/17/2014 04/21/2014 12/17/2012  Falls in the past year? No Yes Yes Yes  Number falls in past yr: - 1 1 1   Injury with Fall? - No No Yes  Risk for fall due to : - Impaired balance/gait;History of fall(s) - -   Functional Status Survey:    Vitals:   09/10/16 1546  BP: (!) 150/72  Pulse: 74  Resp: 18  Weight: 151 lb (68.5 kg)  Height: 5\' 6"  (1.676 m)    Body mass index is 24.37 kg/m. Physical Exam  Constitutional: She appears well-developed and well-nourished.  HENT:  Head: Normocephalic and atraumatic.  Mouth/Throat: Oropharynx is clear and moist.  Eyes: Pupils are equal, round, and reactive to light. Conjunctivae and EOM are normal.  Neck: Normal range of motion. Neck supple. No JVD present.  Cardiovascular: Normal rate and regular rhythm.   Murmur heard. Pulmonary/Chest: Effort normal and breath sounds normal. No respiratory distress. She has no wheezes. She has no rales.  Abdominal: Soft. Bowel sounds are normal. She exhibits no distension. There is no tenderness.  Musculoskeletal: Normal range of motion. She exhibits edema and deformity.  Trace ankle edema, limited ROM with her shoulder, on wheelchair, the right knee brace  Lymphadenopathy:    She has no cervical adenopathy.  Neurological: She is alert.  Skin: Skin is warm and dry. No rash noted. No pallor.  Psychiatric: She has a normal mood and affect.    Labs reviewed:  Recent Labs  08/24/16 1053 08/25/16 0105 08/26/16 0427 09/03/16 09/05/16 09/10/16  NA 140 138 138 141 142 140  K 3.8 3.4* 4.0 4.2 4.2 4.1  CL 109 106 105  --   --   --   CO2 21* 22 21*  --   --   --   GLUCOSE 127* 161* 137*  --   --   --   BUN 35* 41* 64* 42* 33* 33*  CREATININE 2.04* 2.20* 2.90* 2.4* 2.5* 2.7*  CALCIUM 8.8* 8.5* 8.2*  --   --   --   MG  --  1.9  --   --   --   --     Recent Labs  04/04/16 07/30/16 08/24/16 1053  AST 12* 15  14 17   ALT 6* 19  6* 9*  ALKPHOS 38 33  36 42  BILITOT  --   --  0.9  PROT  --   --  7.1  ALBUMIN  --   --  3.8    Recent Labs  08/24/16 1053 08/25/16 0105 08/26/16 0427 09/05/16 09/10/16  WBC 10.8* 19.8* 12.9* 9.3 6.0  NEUTROABS 8.2*  --   --   --   --   HGB 12.2 11.1* 10.0* 9.1* 8.7*  HCT 36.3 33.0* 30.6* 27* 27*  MCV 89.9 88.2 86.4  --   --   PLT 186 190 197 195 178   Lab Results  Component Value Date   TSH 2.21 04/04/2016   Lab  Results  Component Value Date   HGBA1C 5.6 11/06/2014   Lab Results  Component Value Date   CHOL  126 05/28/2016   HDL 44 05/28/2016   LDLCALC 63 05/28/2016   TRIG 102 05/28/2016   CHOLHDL 3.1 11/06/2014    Significant Diagnostic Results in last 30 days:  Dg Chest Port 1 View  Result Date: 08/24/2016 CLINICAL DATA:  Patient with worsening shortness of breath. EXAM: PORTABLE CHEST 1 VIEW COMPARISON:  Chest radiograph 03/07/2015. FINDINGS: Monitoring leads overlie the patient. Patient is mildly rotated to the right. Cardiac contours upper limits of normal. Interval development of patchy consolidation within the right mid lung with associated heterogeneous opacities right lower lung. Small right pleural effusion. Left lung is clear. Bilateral shoulder joint degenerative changes. IMPRESSION: Interval development of consolidation predominately within the right mid lung with heterogeneous right lower lung opacities favored to represent pneumonia in the appropriate clinical setting. Followup PA and lateral chest X-ray is recommended in 3-4 weeks following trial of antibiotic therapy to ensure resolution and exclude underlying malignancy. Small right pleural effusion. Electronically Signed   By: Lovey Newcomer M.D.   On: 08/24/2016 10:53   Dg Swallowing Func-speech Pathology  Result Date: 08/25/2016 Objective Swallowing Evaluation: Type of Study: MBS-Modified Barium Swallow Study Patient Details Name: LARON ANGELINI MRN: [id number] Date of Birth: Dec 13, 1925 Today's Date: 08/25/2016 Time: SLP Start Time (ACUTE ONLY): 1332-SLP Stop Time (ACUTE ONLY): 1348 SLP Time Calculation (min) (ACUTE ONLY): 16 min Past Medical History: Past Medical History: Diagnosis Date . Arthritis   Osteoarthritis . Cancer of left breast [facility]) August 2016 . Cerebral atherosclerosis 12/19/2012 . Cerebral atrophy 12/19/2012 . Cerebral ischemia 12/19/2012  Microvascular  . CKD (chronic kidney disease) stage 3, GFR 30-59 ml/min 11/03/2014 . Cough  .  Dementia  . Diverticulosis of colon (without mention of hemorrhage)  . Edema  . Flat feet  . GERD (gastroesophageal reflux disease)  . Glaucoma  . Heart murmur  . Hyperlipidemia  . Hypertension  . Hypertonicity of bladder  . Ischemic cardiomyopathy 11/04/2014 . Lumbago  . Memory change 03/28/2016 . Osteoarthrosis, unspecified whether generalized or localized, unspecified site  . Other malaise and fatigue  . Pain in joint, pelvic region and thigh  . Pain in joint, shoulder region  . Personal history of fall  . RLQ abdominal pain 01/17/2011 . Senile osteoporosis  . Small bowel obstruction ([place])  . STEMI (ST elevation myocardial infarction) ([place]) 11/04/2014 . Unstable gait 11/04/2014 . Urine, incontinence, stress female 09/18/2014 Past Surgical History: Past Surgical History: Procedure Laterality Date . BREAST LUMPECTOMY Bilateral  . BREAST LUMPECTOMY WITH RADIOACTIVE SEED LOCALIZATION Left 03/15/2015  Procedure: LEFT BREAST LUMPECTOMY WITH RADIOACTIVE SEED LOCALIZATION;  Surgeon: Fanny Skates, MD;  Location: [facility];  Service: General;  Laterality: Left; . CARDIAC CATHETERIZATION N/A 11/08/2014  Procedure: Left Heart Cath and Coronary Angiography;  Surgeon: [name], MD;  Location: [place] CV LAB;  Service: Cardiovascular;  Laterality: N/A; . COLON SURGERY  02/1996  Colectomy, partial for diverticular bleed 90% . EYE SURGERY   . JOINT REPLACEMENT Left 12/2000  Left knee; Dr. Wynelle Link . LAPAROSCOPIC LYSIS OF ADHESIONS  11/15/10  Exploratory . TONSILLECTOMY   HPI: CAITLYNE INGHAM is a 83 y.o. female history of dementia, reflux, hyperlipidemia, hypertension, previous breast cancer here presenting with altered mental status, fever, hypoxia.  Found to have right sided PNA.  No Data Recorded Assessment / Plan / Recommendation CHL IP CLINICAL IMPRESSIONS 08/25/2016 Clinical Impression Pt demonstrates normal oropharyngeal swallow function for age. Mild premature spillage of solids to the valleculae while masticating. No  penetration or aspiration. No SLP f/u  needed will sign off.  SLP Visit Diagnosis Dysphagia, unspecified (R13.10) Attention and concentration deficit following -- Frontal lobe and executive function deficit following -- Impact on safety and function Mild aspiration risk   CHL IP TREATMENT RECOMMENDATION 08/25/2016 Treatment Recommendations No treatment recommended at this time   No flowsheet data found. CHL IP DIET RECOMMENDATION 08/25/2016 SLP Diet Recommendations Regular solids;Thin liquid Liquid Administration via Cup;Straw Medication Administration Whole meds with liquid Compensations -- Postural Changes Seated upright at 90 degrees   CHL IP OTHER RECOMMENDATIONS 08/25/2016 Recommended Consults -- Oral Care Recommendations Oral care BID Other Recommendations --   CHL IP FOLLOW UP RECOMMENDATIONS 08/25/2016 Follow up Recommendations None   No flowsheet data found.     CHL IP ORAL PHASE 08/25/2016 Oral Phase WFL Oral - Pudding Teaspoon -- Oral - Pudding Cup -- Oral - Honey Teaspoon -- Oral - Honey Cup -- Oral - Nectar Teaspoon -- Oral - Nectar Cup -- Oral - Nectar Straw -- Oral - Thin Teaspoon -- Oral - Thin Cup -- Oral - Thin Straw -- Oral - Puree -- Oral - Mech Soft -- Oral - Regular -- Oral - Multi-Consistency -- Oral - Pill -- Oral Phase - Comment --  CHL IP PHARYNGEAL PHASE 08/25/2016 Pharyngeal Phase WFL Pharyngeal- Pudding Teaspoon -- Pharyngeal -- Pharyngeal- Pudding Cup -- Pharyngeal -- Pharyngeal- Honey Teaspoon -- Pharyngeal -- Pharyngeal- Honey Cup -- Pharyngeal -- Pharyngeal- Nectar Teaspoon -- Pharyngeal -- Pharyngeal- Nectar Cup -- Pharyngeal -- Pharyngeal- Nectar Straw -- Pharyngeal -- Pharyngeal- Thin Teaspoon -- Pharyngeal -- Pharyngeal- Thin Cup -- Pharyngeal -- Pharyngeal- Thin Straw -- Pharyngeal -- Pharyngeal- Puree -- Pharyngeal -- Pharyngeal- Mechanical Soft -- Pharyngeal -- Pharyngeal- Regular -- Pharyngeal -- Pharyngeal- Multi-consistency -- Pharyngeal -- Pharyngeal- Pill -- Pharyngeal -- Pharyngeal  Comment --  No flowsheet data found. No flowsheet data found. DeBlois, Katherene Ponto 08/25/2016, 2:33 PM               Assessment/Plan Anemia 04/04/16 wbc 5.7, Hgb 10.4, plt 177, Na 144, K 4.0, Bun 31, creat 2.44, TSH 2.21, BNP 123.2 7/241/8 Na 140, K 4.1, Bun 33, creat 2.69, wbc 6.0, Hgb 8.7, plt 8.5, CBC Vit B12, Iron, Folate retic count, guaiac stools x3  GERD (gastroesophageal reflux disease) Stable, continue Famotidine  20mg  daily. Change Tylenol to bid prn to reduce possible GI irritation.   Right knee pain The patient denied pain of the right knee, continue knee brace, change Tylenol to bid prn to reduce possible GI irritation.      Family/ staff Communication: SNF  Labs/tests ordered: BC Vit B12, Iron, Folate retic count, guaiac stools x3

## 2016-09-11 NOTE — Assessment & Plan Note (Signed)
The patient denied pain of the right knee, continue knee brace, change Tylenol to bid prn to reduce possible GI irritation.

## 2016-09-11 NOTE — Assessment & Plan Note (Signed)
Stable, continue Famotidine  20mg  daily. Change Tylenol to bid prn to reduce possible GI irritation.

## 2016-09-11 NOTE — Assessment & Plan Note (Signed)
04/04/16 wbc 5.7, Hgb 10.4, plt 177, Na 144, K 4.0, Bun 31, creat 2.44, TSH 2.21, BNP 123.2 7/241/8 Na 140, K 4.1, Bun 33, creat 2.69, wbc 6.0, Hgb 8.7, plt 8.5, CBC Vit B12, Iron, Folate retic count, guaiac stools x3

## 2016-09-17 ENCOUNTER — Other Ambulatory Visit: Payer: Self-pay | Admitting: *Deleted

## 2016-09-17 DIAGNOSIS — J168 Pneumonia due to other specified infectious organisms: Secondary | ICD-10-CM | POA: Diagnosis not present

## 2016-09-17 DIAGNOSIS — I5043 Acute on chronic combined systolic (congestive) and diastolic (congestive) heart failure: Secondary | ICD-10-CM | POA: Diagnosis not present

## 2016-09-17 DIAGNOSIS — I5032 Chronic diastolic (congestive) heart failure: Secondary | ICD-10-CM | POA: Diagnosis not present

## 2016-09-17 DIAGNOSIS — D631 Anemia in chronic kidney disease: Secondary | ICD-10-CM | POA: Diagnosis not present

## 2016-09-17 LAB — CBC AND DIFFERENTIAL
HCT: 26 — AB (ref 36–46)
Hemoglobin: 8.8 — AB (ref 12.0–16.0)
Platelets: 190 (ref 150–399)
WBC: 5.3

## 2016-09-19 ENCOUNTER — Non-Acute Institutional Stay (SKILLED_NURSING_FACILITY): Payer: Medicare Other | Admitting: Nurse Practitioner

## 2016-09-19 DIAGNOSIS — K219 Gastro-esophageal reflux disease without esophagitis: Secondary | ICD-10-CM

## 2016-09-19 DIAGNOSIS — D500 Iron deficiency anemia secondary to blood loss (chronic): Secondary | ICD-10-CM

## 2016-09-19 DIAGNOSIS — M25561 Pain in right knee: Secondary | ICD-10-CM

## 2016-09-19 NOTE — Assessment & Plan Note (Signed)
The patient denied pain of the right knee, continue knee brace, Tylenol bid prn

## 2016-09-19 NOTE — Assessment & Plan Note (Signed)
Stable, continue Famotidine  20mg  daily. Normal serum Iron 09/17/16

## 2016-09-19 NOTE — Assessment & Plan Note (Signed)
09/17/16 wbc 5.3, Hgb 8.8, plt 190, Iron 46, Vit B12 1017, Folate 4.5(3.4-5.4) borderline, adding folic acid 1mg  po daily, CBC one month Continue Vit B12.

## 2016-09-19 NOTE — Progress Notes (Signed)
Location:  [place] Room Number: 5 Place of Service:  SNF (31) Provider:  Skyllar Notarianni, Manxie  NP  [name], MD  Patient Care Team: [name], MD as PCP - General (Internal Medicine) Juanita Craver, MD as Consulting Physician (Gastroenterology) Shon Hough, MD as Consulting Physician (Ophthalmology) Thornell Sartorius, MD as Consulting Physician (Otolaryngology) [place] Lucynda Rosano X, NP as Nurse Practitioner (Nurse Practitioner) Bo Merino, MD as Consulting Physician (Rheumatology) Suella Broad, MD as Consulting Physician (Physical Medicine and Rehabilitation) Netta Cedars, MD as Consulting Physician (Orthopedic Surgery) Fanny Skates, MD as Consulting Physician (General Surgery) Truitt Merle, MD as Consulting Physician (Hematology) Thea Silversmith, MD (Inactive) as Consulting Physician (Radiation Oncology) Rockwell Germany, RN as Registered Nurse Mauro Kaufmann, RN as Registered Nurse Jake Shark, Johny Blamer, NP as Nurse Practitioner (Nurse Practitioner) [name], MD as Consulting Physician (Cardiology)  Extended Emergency Contact Information Primary Emergency Contact: [name],[name] Address: [address]          [place] [address] [place] of [place] Phone: [phone] Mobile Phone: [phone] Relation: Daughter Secondary Emergency Contact: [name],[name]  [place] Mobile Phone: [phone] Relation: None  Code Status:  DNR Goals of care: Advanced Directive information Advanced Directives 09/10/2016  Does Patient Have a Medical Advance Directive? Yes  Type of Advance Directive Out of facility DNR (pink MOST or yellow form)  Does patient want to make changes to medical advance directive? No - Patient declined  Copy of [place] in Chart? Yes  Pre-existing out of facility DNR order (yellow form or pink MOST form) Yellow form placed in chart (order not  valid for inpatient use)     Chief Complaint  Patient presents with  . Acute Visit    low folate, anemia    HPI:  Pt is a 83 y.o. female seen today for an acute visit for anemia, 04/04/16 Hgb 10.4, plt 177,  7/241/8 Hgb 8.7, then Hgb 8.8 09/17/16, she was found lower end of Folate 4.5(3.4-5.4), normal Iron and Vit B12 09/17/16, she has no apparent bleed, denied abd pain, nausea, vomiting, or tarry stools.  She takes Vit B12 109mcg po daily.      Hx of CKD, creat 2.7 09/10/16, her baseline creat 2-3. GERD, taking Famotidine 20mg  daily. Chronic right knee, wearing knee brace, taking Tylenol 650mg  bid prn     Past Medical History:  Diagnosis Date  . Arthritis    Osteoarthritis  . Cancer of left breast [facility]) August 2016  . Cerebral atherosclerosis 12/19/2012  . Cerebral atrophy 12/19/2012  . Cerebral ischemia 12/19/2012   Microvascular   . CKD (chronic kidney disease) stage 3, GFR 30-59 ml/min 11/03/2014  . Cough   . Dementia   . Diverticulosis of colon (without mention of hemorrhage)   . Edema   . Flat feet   . GERD (gastroesophageal reflux disease)   . Glaucoma   . Heart murmur   . Hyperlipidemia   . Hypertension   . Hypertonicity of bladder   . Ischemic cardiomyopathy 11/04/2014  . Lumbago   . Memory change 03/28/2016  . Osteoarthrosis, unspecified whether generalized or localized, unspecified site   . Other malaise and fatigue   . Pain in joint, pelvic region and thigh   . Pain in joint, shoulder region   . Personal history of fall   . RLQ abdominal pain 01/17/2011  . Senile osteoporosis   . Small bowel obstruction ([place])   . STEMI (ST  elevation myocardial infarction) ([place]) 11/04/2014  . Unstable gait 11/04/2014  . Urine, incontinence, stress female 09/18/2014   Past Surgical History:  Procedure Laterality Date  . BREAST LUMPECTOMY Bilateral   . BREAST LUMPECTOMY WITH RADIOACTIVE SEED LOCALIZATION Left 03/15/2015   Procedure: LEFT BREAST LUMPECTOMY WITH RADIOACTIVE SEED  LOCALIZATION;  Surgeon: Fanny Skates, MD;  Location: [place];  Service: General;  Laterality: Left;  . CARDIAC CATHETERIZATION N/A 11/08/2014   Procedure: Left Heart Cath and Coronary Angiography;  Surgeon: [name], MD;  Location: [place] CV LAB;  Service: Cardiovascular;  Laterality: N/A;  . COLON SURGERY  02/1996   Colectomy, partial for diverticular bleed 90%  . EYE SURGERY    . JOINT REPLACEMENT Left 12/2000   Left knee; Dr. Wynelle Link  . LAPAROSCOPIC LYSIS OF ADHESIONS  11/15/10   Exploratory  . TONSILLECTOMY      Allergies  Allergen Reactions  . Sulfa Antibiotics Itching    Outpatient Encounter Prescriptions as of 09/19/2016  Medication Sig  . acetaminophen (TYLENOL) 325 MG tablet Take 650 mg by mouth 2 (two) times daily. Take 2 tablets every 6 hours as needed for mild pain or fever  . alum & mag hydroxide-simeth (MAALOX/MYLANTA) 200-200-20 MG/5ML suspension Take by mouth. 49ml every 4 hours as needed for indigestion  . atorvastatin (LIPITOR) 20 MG tablet Take one tablet by mouth once daily for cholesterol  . BETIMOL 0.5 % ophthalmic solution Place 1 drop into both eyes every morning.   . carvedilol (COREG) 6.25 MG tablet Take 6.25 mg by mouth 2 (two) times daily.   . cloNIDine (CATAPRES) 0.1 MG tablet Take 0.1 mg by mouth 2 (two) times daily.  . Coenzyme Q10 50 MG TABS Take one tablet by mouth at bedtime  . famotidine (PEPCID) 20 MG tablet Take 20 mg by mouth daily.  . Hypromellose (ARTIFICIAL TEARS OP) Apply 2 drops to eye 2 (two) times daily. Both eyes  . isosorbide mononitrate (IMDUR) 60 MG 24 hr tablet Take 60 mg by mouth at bedtime.   Marland Kitchen KLOR-CON 10 10 MEQ tablet Take 10 mEq by mouth daily.  Marland Kitchen latanoprost (XALATAN) 0.005 % ophthalmic solution Place 1 drop into both eyes at bedtime.   . mirtazapine (REMERON) 7.5 MG tablet TAKE 1 TABLET (7.5 MG TOTAL) BY MOUTH AT BEDTIME. NIGHTLY FOR APPETITE  . vitamin B-12 (CYANOCOBALAMIN) 1000 MCG tablet Take 1 tablet by mouth twice  daily On Mondays,Wednesdays and Fridays   No facility-administered encounter medications on file as of 09/19/2016.     Review of Systems  Constitutional: Negative for activity change and appetite change.  HENT: Positive for hearing loss. Negative for congestion and trouble swallowing.   Eyes: Negative for visual disturbance.       Wears glasses  Respiratory: Negative for cough.   Cardiovascular: Positive for leg swelling. Negative for palpitations.       Chronic trace ankle edema.   Gastrointestinal: Negative for abdominal pain.          Genitourinary: Negative for difficulty urinating and frequency.  Musculoskeletal: Positive for gait problem. Negative for back pain.       W/c for mobility.   Skin: Positive for pallor. Negative for rash.  Neurological: Negative for dizziness, speech difficulty, weakness and headaches.  Psychiatric/Behavioral: Positive for confusion. Negative for agitation and behavioral problems.    Immunization History  Administered Date(s) Administered  . Influenza Whole 11/19/2011, 12/02/2012  . Influenza-Unspecified 12/06/2013, 12/06/2015  . PPD Test 11/10/2014  . Pneumococcal Polysaccharide-23 02/18/2009  .  Tdap 04/02/2016   Pertinent  Health Maintenance Due  Topic Date Due  . INFLUENZA VACCINE  09/18/2016  . MAMMOGRAM  02/18/2017 (Originally 10/13/2015)  . PNA vac Low Risk Adult (2 of 2 - PCV13) 02/18/2017 (Originally 02/18/2010)  . DEXA SCAN  Completed   Fall Risk  07/13/2015 11/17/2014 04/21/2014 12/17/2012  Falls in the past year? No Yes Yes Yes  Number falls in past yr: - 1 1 1   Injury with Fall? - No No Yes  Comment - - - nose lacteration  Risk for fall due to : - Impaired balance/gait;History of fall(s) - -   Functional Status Survey:    There were no vitals filed for this visit. There is no height or weight on file to calculate BMI. Physical Exam  Constitutional: She appears well-developed and well-nourished.  HENT:  Head: Normocephalic and  atraumatic.  Eyes: Pupils are equal, round, and reactive to light. Conjunctivae and EOM are normal. No scleral icterus.  Neck: Normal range of motion. Neck supple.  Cardiovascular: Normal rate and regular rhythm.   Murmur heard. Pulmonary/Chest: Effort normal and breath sounds normal. No respiratory distress. She has no rales.  Abdominal: Soft. Bowel sounds are normal. She exhibits no distension. There is no tenderness.  Musculoskeletal: Normal range of motion. She exhibits edema and deformity.  Trace ankle edema, limited ROM with her shoulder, on wheelchair, the right knee brace  Neurological: She is alert.  Oriented to people and place.   Skin: Skin is warm and dry. No rash noted. There is pallor.  Psychiatric: She has a normal mood and affect.    Labs reviewed:  Recent Labs  08/24/16 1053 08/25/16 0105 08/26/16 0427 09/03/16 09/05/16 09/10/16  NA 140 138 138 141 142 140  K 3.8 3.4* 4.0 4.2 4.2 4.1  CL 109 106 105  --   --   --   CO2 21* 22 21*  --   --   --   GLUCOSE 127* 161* 137*  --   --   --   BUN 35* 41* 64* 42* 33* 33*  CREATININE 2.04* 2.20* 2.90* 2.4* 2.5* 2.7*  CALCIUM 8.8* 8.5* 8.2*  --   --   --   MG  --  1.9  --   --   --   --     Recent Labs  04/04/16 07/30/16 08/24/16 1053  AST 12* 15  14 17   ALT 6* 19  6* 9*  ALKPHOS 38 33  36 42  BILITOT  --   --  0.9  PROT  --   --  7.1  ALBUMIN  --   --  3.8    Recent Labs  08/24/16 1053 08/25/16 0105 08/26/16 0427 09/05/16 09/10/16 09/17/16  WBC 10.8* 19.8* 12.9* 9.3 6.0 5.3  NEUTROABS 8.2*  --   --   --   --   --   HGB 12.2 11.1* 10.0* 9.1* 8.7* 8.8*  HCT 36.3 33.0* 30.6* 27* 27* 26*  MCV 89.9 88.2 86.4  --   --   --   PLT 186 190 197 195 178 190   Lab Results  Component Value Date   TSH 2.21 04/04/2016   Lab Results  Component Value Date   HGBA1C 5.6 11/06/2014   Lab Results  Component Value Date   CHOL 126 05/28/2016   HDL 44 05/28/2016   LDLCALC 63 05/28/2016   TRIG 102 05/28/2016    CHOLHDL 3.1 11/06/2014    Significant  Diagnostic Results in last 30 days:  Dg Chest Port 1 View  Result Date: 08/24/2016 CLINICAL DATA:  Patient with worsening shortness of breath. EXAM: PORTABLE CHEST 1 VIEW COMPARISON:  Chest radiograph 03/07/2015. FINDINGS: Monitoring leads overlie the patient. Patient is mildly rotated to the right. Cardiac contours upper limits of normal. Interval development of patchy consolidation within the right mid lung with associated heterogeneous opacities right lower lung. Small right pleural effusion. Left lung is clear. Bilateral shoulder joint degenerative changes. IMPRESSION: Interval development of consolidation predominately within the right mid lung with heterogeneous right lower lung opacities favored to represent pneumonia in the appropriate clinical setting. Followup PA and lateral chest X-ray is recommended in 3-4 weeks following trial of antibiotic therapy to ensure resolution and exclude underlying malignancy. Small right pleural effusion. Electronically Signed   By: Lovey Newcomer M.D.   On: 08/24/2016 10:53   Dg Swallowing Func-speech Pathology  Result Date: 08/25/2016 Objective Swallowing Evaluation: Type of Study: MBS-Modified Barium Swallow Study Patient Details Name: [name] MRN: [id number] Date of Birth: Jun 02, 1925 Today's Date: 08/25/2016 Time: SLP Start Time (ACUTE ONLY): 1332-SLP Stop Time (ACUTE ONLY): 1348 SLP Time Calculation (min) (ACUTE ONLY): 16 min Past Medical History: Past Medical History: Diagnosis Date . Arthritis   Osteoarthritis . Cancer of left breast [facility]) August 2016 . Cerebral atherosclerosis 12/19/2012 . Cerebral atrophy 12/19/2012 . Cerebral ischemia 12/19/2012  Microvascular  . CKD (chronic kidney disease) stage 3, GFR 30-59 ml/min 11/03/2014 . Cough  . Dementia  . Diverticulosis of colon (without mention of hemorrhage)  . Edema  . Flat feet  . GERD (gastroesophageal reflux disease)  . Glaucoma  . Heart murmur  . Hyperlipidemia  .  Hypertension  . Hypertonicity of bladder  . Ischemic cardiomyopathy 11/04/2014 . Lumbago  . Memory change 03/28/2016 . Osteoarthrosis, unspecified whether generalized or localized, unspecified site  . Other malaise and fatigue  . Pain in joint, pelvic region and thigh  . Pain in joint, shoulder region  . Personal history of fall  . RLQ abdominal pain 01/17/2011 . Senile osteoporosis  . Small bowel obstruction ([place])  . STEMI (ST elevation myocardial infarction) ([place]) 11/04/2014 . Unstable gait 11/04/2014 . Urine, incontinence, stress female 09/18/2014 Past Surgical History: Past Surgical History: Procedure Laterality Date . BREAST LUMPECTOMY Bilateral  . BREAST LUMPECTOMY WITH RADIOACTIVE SEED LOCALIZATION Left 03/15/2015  Procedure: LEFT BREAST LUMPECTOMY WITH RADIOACTIVE SEED LOCALIZATION;  Surgeon: Fanny Skates, MD;  Location: [place];  Service: General;  Laterality: Left; . CARDIAC CATHETERIZATION N/A 11/08/2014  Procedure: Left Heart Cath and Coronary Angiography;  Surgeon: [name], MD;  Location: [place] CV LAB;  Service: Cardiovascular;  Laterality: N/A; . COLON SURGERY  02/1996  Colectomy, partial for diverticular bleed 90% . EYE SURGERY   . JOINT REPLACEMENT Left 12/2000  Left knee; Dr. Wynelle Link . LAPAROSCOPIC LYSIS OF ADHESIONS  11/15/10  Exploratory . TONSILLECTOMY   HPI: [name] is a 83 y.o. female history of dementia, reflux, hyperlipidemia, hypertension, previous breast cancer here presenting with altered mental status, fever, hypoxia.  Found to have right sided PNA.  No Data Recorded Assessment / Plan / Recommendation CHL IP CLINICAL IMPRESSIONS 08/25/2016 Clinical Impression Pt demonstrates normal oropharyngeal swallow function for age. Mild premature spillage of solids to the valleculae while masticating. No penetration or aspiration. No SLP f/u needed will sign off.  SLP Visit Diagnosis Dysphagia, unspecified (R13.10) Attention and concentration deficit following -- Frontal lobe and executive  function deficit following -- Impact  on safety and function Mild aspiration risk   CHL IP TREATMENT RECOMMENDATION 08/25/2016 Treatment Recommendations No treatment recommended at this time   No flowsheet data found. CHL IP DIET RECOMMENDATION 08/25/2016 SLP Diet Recommendations Regular solids;Thin liquid Liquid Administration via Cup;Straw Medication Administration Whole meds with liquid Compensations -- Postural Changes Seated upright at 90 degrees   CHL IP OTHER RECOMMENDATIONS 08/25/2016 Recommended Consults -- Oral Care Recommendations Oral care BID Other Recommendations --   CHL IP FOLLOW UP RECOMMENDATIONS 08/25/2016 Follow up Recommendations None   No flowsheet data found.     CHL IP ORAL PHASE 08/25/2016 Oral Phase WFL Oral - Pudding Teaspoon -- Oral - Pudding Cup -- Oral - Honey Teaspoon -- Oral - Honey Cup -- Oral - Nectar Teaspoon -- Oral - Nectar Cup -- Oral - Nectar Straw -- Oral - Thin Teaspoon -- Oral - Thin Cup -- Oral - Thin Straw -- Oral - Puree -- Oral - Mech Soft -- Oral - Regular -- Oral - Multi-Consistency -- Oral - Pill -- Oral Phase - Comment --  CHL IP PHARYNGEAL PHASE 08/25/2016 Pharyngeal Phase WFL Pharyngeal- Pudding Teaspoon -- Pharyngeal -- Pharyngeal- Pudding Cup -- Pharyngeal -- Pharyngeal- Honey Teaspoon -- Pharyngeal -- Pharyngeal- Honey Cup -- Pharyngeal -- Pharyngeal- Nectar Teaspoon -- Pharyngeal -- Pharyngeal- Nectar Cup -- Pharyngeal -- Pharyngeal- Nectar Straw -- Pharyngeal -- Pharyngeal- Thin Teaspoon -- Pharyngeal -- Pharyngeal- Thin Cup -- Pharyngeal -- Pharyngeal- Thin Straw -- Pharyngeal -- Pharyngeal- Puree -- Pharyngeal -- Pharyngeal- Mechanical Soft -- Pharyngeal -- Pharyngeal- Regular -- Pharyngeal -- Pharyngeal- Multi-consistency -- Pharyngeal -- Pharyngeal- Pill -- Pharyngeal -- Pharyngeal Comment --  No flowsheet data found. No flowsheet data found. DeBlois, Katherene Ponto 08/25/2016, 2:33 PM               Assessment/Plan Anemia 09/17/16 wbc 5.3, Hgb 8.8, plt 190, Iron 46,  Vit B12 1017, Folate 4.5(3.4-5.4) borderline, adding folic acid 1mg  po daily, CBC one month Continue Vit B12.     GERD (gastroesophageal reflux disease) Stable, continue Famotidine  20mg  daily. Normal serum Iron 09/17/16  Right knee pain The patient denied pain of the right knee, continue knee brace, Tylenol bid prn      Family/ staff Communication: SNF  Labs/tests ordered: CBC one month

## 2016-09-25 ENCOUNTER — Non-Acute Institutional Stay (SKILLED_NURSING_FACILITY): Payer: Medicare Other | Admitting: Nurse Practitioner

## 2016-09-25 ENCOUNTER — Encounter: Payer: Self-pay | Admitting: Nurse Practitioner

## 2016-09-25 DIAGNOSIS — N183 Chronic kidney disease, stage 3 unspecified: Secondary | ICD-10-CM

## 2016-09-25 DIAGNOSIS — D519 Vitamin B12 deficiency anemia, unspecified: Secondary | ICD-10-CM | POA: Diagnosis not present

## 2016-09-25 DIAGNOSIS — I10 Essential (primary) hypertension: Secondary | ICD-10-CM

## 2016-09-25 DIAGNOSIS — K219 Gastro-esophageal reflux disease without esophagitis: Secondary | ICD-10-CM | POA: Diagnosis not present

## 2016-09-25 DIAGNOSIS — I5032 Chronic diastolic (congestive) heart failure: Secondary | ICD-10-CM

## 2016-09-25 NOTE — Progress Notes (Deleted)
Location:  [place] Room Number: 5 Place of Service:  SNF (31) Provider:  Mast, Rudie Meyer, MD  Patient Care Team: [name], MD as PCP - General (Internal Medicine) Juanita Craver, MD as Consulting Physician (Gastroenterology) Shon Hough, MD as Consulting Physician (Ophthalmology) Thornell Sartorius, MD as Consulting Physician (Otolaryngology) [place] Mast, Man X, NP as Nurse Practitioner (Nurse Practitioner) Bo Merino, MD as Consulting Physician (Rheumatology) Suella Broad, MD as Consulting Physician (Physical Medicine and Rehabilitation) Netta Cedars, MD as Consulting Physician (Orthopedic Surgery) Fanny Skates, MD as Consulting Physician (General Surgery) Truitt Merle, MD as Consulting Physician (Hematology) Thea Silversmith, MD (Inactive) as Consulting Physician (Radiation Oncology) Rockwell Germany, RN as Registered Nurse Mauro Kaufmann, RN as Registered Nurse Jake Shark, Johny Blamer, NP as Nurse Practitioner (Nurse Practitioner) [name], MD as Consulting Physician (Cardiology)  Extended Emergency Contact Information Primary Emergency Contact: [name],[name] Address: [address]          [place] [address] [place] of [place] Phone: [phone] Mobile Phone: [phone] Relation: Daughter Secondary Emergency Contact: [name],[name]  [place] Mobile Phone: [phone] Relation: None  Code Status:  DNR Goals of care: Advanced Directive information Advanced Directives 09/10/2016  Does Patient Have a Medical Advance Directive? Yes  Type of Advance Directive Out of facility DNR (pink MOST or yellow form)  Does patient want to make changes to medical advance directive? No - Patient declined  Copy of [place] in Chart? Yes  Pre-existing out of facility DNR order (yellow form or pink MOST form) Yellow form placed in chart (order not valid  for inpatient use)     Chief Complaint  Patient presents with  . Medical Management of Chronic Issues    HPI:  Pt is a 83 y.o. female seen today for medical management of chronic diseases.     Past Medical History:  Diagnosis Date  . Arthritis    Osteoarthritis  . Cancer of left breast [facility]) August 2016  . Cerebral atherosclerosis 12/19/2012  . Cerebral atrophy 12/19/2012  . Cerebral ischemia 12/19/2012   Microvascular   . CKD (chronic kidney disease) stage 3, GFR 30-59 ml/min 11/03/2014  . Cough   . Dementia   . Diverticulosis of colon (without mention of hemorrhage)   . Edema   . Flat feet   . GERD (gastroesophageal reflux disease)   . Glaucoma   . Heart murmur   . Hyperlipidemia   . Hypertension   . Hypertonicity of bladder   . Ischemic cardiomyopathy 11/04/2014  . Lumbago   . Memory change 03/28/2016  . Osteoarthrosis, unspecified whether generalized or localized, unspecified site   . Other malaise and fatigue   . Pain in joint, pelvic region and thigh   . Pain in joint, shoulder region   . Personal history of fall   . RLQ abdominal pain 01/17/2011  . Senile osteoporosis   . Small bowel obstruction ([place])   . STEMI (ST elevation myocardial infarction) ([place]) 11/04/2014  . Unstable gait 11/04/2014  . Urine, incontinence, stress female 09/18/2014   Past Surgical History:  Procedure Laterality Date  . BREAST LUMPECTOMY Bilateral   . BREAST LUMPECTOMY WITH RADIOACTIVE SEED LOCALIZATION Left 03/15/2015   Procedure: LEFT BREAST LUMPECTOMY WITH RADIOACTIVE SEED LOCALIZATION;  Surgeon: Fanny Skates, MD;  Location: [place];  Service: General;  Laterality: Left;  . CARDIAC CATHETERIZATION N/A 11/08/2014   Procedure: Left Heart Cath and Coronary Angiography;  Surgeon:  [name], MD;  Location: [place] CV LAB;  Service: Cardiovascular;  Laterality: N/A;  . COLON SURGERY  02/1996   Colectomy, partial for diverticular bleed 90%  . EYE SURGERY    . JOINT REPLACEMENT Left  12/2000   Left knee; Dr. Wynelle Link  . LAPAROSCOPIC LYSIS OF ADHESIONS  11/15/10   Exploratory  . TONSILLECTOMY      Allergies  Allergen Reactions  . Sulfa Antibiotics Itching    Outpatient Encounter Prescriptions as of 09/25/2016  Medication Sig  . acetaminophen (TYLENOL) 325 MG tablet Take 650 mg by mouth 2 (two) times daily. Take 2 tablets every 6 hours as needed for mild pain or fever  . alum & mag hydroxide-simeth (MAALOX/MYLANTA) 200-200-20 MG/5ML suspension Take by mouth. 30ml every 4 hours as needed for indigestion  . atorvastatin (LIPITOR) 20 MG tablet Take one tablet by mouth once daily for cholesterol  . BETIMOL 0.5 % ophthalmic solution Place 1 drop into both eyes every morning.   . carvedilol (COREG) 6.25 MG tablet Take 6.25 mg by mouth 2 (two) times daily.   . cloNIDine (CATAPRES) 0.1 MG tablet Take 0.1 mg by mouth 2 (two) times daily.  . Coenzyme Q10 50 MG TABS Take one tablet by mouth at bedtime  . famotidine (PEPCID) 20 MG tablet Take 20 mg by mouth daily.  . folic acid (FOLVITE) 1 MG tablet Take 1 mg by mouth daily.  . Hypromellose (ARTIFICIAL TEARS OP) Apply 2 drops to eye 2 (two) times daily. Both eyes  . isosorbide mononitrate (IMDUR) 60 MG 24 hr tablet Take 60 mg by mouth at bedtime.   Marland Kitchen KLOR-CON 10 10 MEQ tablet Take 10 mEq by mouth daily.  Marland Kitchen latanoprost (XALATAN) 0.005 % ophthalmic solution Place 1 drop into both eyes at bedtime.   . mirtazapine (REMERON) 7.5 MG tablet TAKE 1 TABLET (7.5 MG TOTAL) BY MOUTH AT BEDTIME. NIGHTLY FOR APPETITE  . torsemide (DEMADEX) 10 MG tablet Take 10 mg by mouth daily.  . vitamin B-12 (CYANOCOBALAMIN) 1000 MCG tablet Take 1 tablet by mouth twice daily On Mondays,Wednesdays and Fridays   No facility-administered encounter medications on file as of 09/25/2016.     Review of Systems  Immunization History  Administered Date(s) Administered  . Influenza Whole 11/19/2011, 12/02/2012  . Influenza-Unspecified 12/06/2013, 12/06/2015  .  PPD Test 11/10/2014  . Pneumococcal Polysaccharide-23 02/18/2009  . Tdap 04/02/2016   Pertinent  Health Maintenance Due  Topic Date Due  . INFLUENZA VACCINE  09/18/2016  . MAMMOGRAM  02/18/2017 (Originally 10/13/2015)  . PNA vac Low Risk Adult (2 of 2 - PCV13) 02/18/2017 (Originally 02/18/2010)  . DEXA SCAN  Completed   Fall Risk  07/13/2015 11/17/2014 04/21/2014 12/17/2012  Falls in the past year? No Yes Yes Yes  Number falls in past yr: - 1 1 1   Injury with Fall? - No No Yes  Comment - - - nose lacteration  Risk for fall due to : - Impaired balance/gait;History of fall(s) - -   Functional Status Survey:    There were no vitals filed for this visit. There is no height or weight on file to calculate BMI. Physical Exam  Labs reviewed:  Recent Labs  08/24/16 1053 08/25/16 0105 08/26/16 0427 09/03/16 09/05/16 09/10/16  NA 140 138 138 141 142 140  K 3.8 3.4* 4.0 4.2 4.2 4.1  CL 109 106 105  --   --   --   CO2 21* 22 21*  --   --   --  GLUCOSE 127* 161* 137*  --   --   --   BUN 35* 41* 64* 42* 33* 33*  CREATININE 2.04* 2.20* 2.90* 2.4* 2.5* 2.7*  CALCIUM 8.8* 8.5* 8.2*  --   --   --   MG  --  1.9  --   --   --   --     Recent Labs  04/04/16 07/30/16 08/24/16 1053  AST 12* 15  14 17   ALT 6* 19  6* 9*  ALKPHOS 38 33  36 42  BILITOT  --   --  0.9  PROT  --   --  7.1  ALBUMIN  --   --  3.8    Recent Labs  08/24/16 1053 08/25/16 0105 08/26/16 0427 09/05/16 09/10/16 09/17/16  WBC 10.8* 19.8* 12.9* 9.3 6.0 5.3  NEUTROABS 8.2*  --   --   --   --   --   HGB 12.2 11.1* 10.0* 9.1* 8.7* 8.8*  HCT 36.3 33.0* 30.6* 27* 27* 26*  MCV 89.9 88.2 86.4  --   --   --   PLT 186 190 197 195 178 190   Lab Results  Component Value Date   TSH 2.21 04/04/2016   Lab Results  Component Value Date   HGBA1C 5.6 11/06/2014   Lab Results  Component Value Date   CHOL 126 05/28/2016   HDL 44 05/28/2016   LDLCALC 63 05/28/2016   TRIG 102 05/28/2016   CHOLHDL 3.1 11/06/2014     Significant Diagnostic Results in last 30 days:  No results found.  Assessment/Plan There are no diagnoses linked to this encounter  Family/ staff Communication:   Labs/tests ordered:

## 2016-09-26 ENCOUNTER — Encounter: Payer: Self-pay | Admitting: Nurse Practitioner

## 2016-09-27 NOTE — Patient Instructions (Signed)
Opened in error

## 2016-09-27 NOTE — Progress Notes (Deleted)
This encounter was created in error - please disregard. This encounter was created in error - please disregard. This encounter was created in error - please disregard. 

## 2016-09-27 NOTE — Progress Notes (Deleted)
This encounter was created in error - please disregard.  This encounter was created in error - please disregard.

## 2016-09-30 NOTE — Assessment & Plan Note (Signed)
Stable, continue Famotidine 20mg daily.   

## 2016-09-30 NOTE — Assessment & Plan Note (Signed)
09/17/16 wbc 5.3, Hgb 8.8, plt 190, Iron 46, Vit B12 1017, Folate 4.5(3.4-5.4) borderline, added folic acid 1mg  po daily, CBC one month Continue Vit B12.

## 2016-09-30 NOTE — Assessment & Plan Note (Signed)
Permissive blood pressure control, continue Carvedilol 6.25mg  bid, Clonidine 0.1mg  bid, Imdur 60mg , Torsemide 10mg .

## 2016-09-30 NOTE — Progress Notes (Signed)
Location:  [place] Room Number: 5 Place of Service:  SNF (31) Provider:  Novaleigh Fernandez, [place]  NP  [name], MD  Patient Care Team: [name], MD as PCP - General (Internal Medicine) Juanita Craver, MD as Consulting Physician (Gastroenterology) Shon Hough, MD as Consulting Physician (Ophthalmology) Thornell Sartorius, MD as Consulting Physician (Otolaryngology) [place] Jorene Kaylor X, NP as Nurse Practitioner (Nurse Practitioner) Bo Merino, MD as Consulting Physician (Rheumatology) Suella Broad, MD as Consulting Physician (Physical Medicine and Rehabilitation) Netta Cedars, MD as Consulting Physician (Orthopedic Surgery) Fanny Skates, MD as Consulting Physician (General Surgery) Truitt Merle, MD as Consulting Physician (Hematology) Thea Silversmith, MD (Inactive) as Consulting Physician (Radiation Oncology) Rockwell Germany, RN as Registered Nurse Mauro Kaufmann, RN as Registered Nurse Jake Shark, Johny Blamer, NP as Nurse Practitioner (Nurse Practitioner) [name], MD as Consulting Physician (Cardiology)  Extended Emergency Contact Information Primary Emergency Contact: [name],[name] Address: [address]          [place] [address] [place] of [place] Phone: [phone] Mobile Phone: [phone] Relation: Daughter Secondary Emergency Contact: [name],[name]  [place] Mobile Phone: [phone] Relation: None  Code Status:  DNR Goals of care: Advanced Directive information Advanced Directives 09/25/2016  Does Patient Have a Medical Advance Directive? Yes  Type of Advance Directive Out of facility DNR (pink MOST or yellow form);Healthcare Power of Attorney  Does patient want to make changes to medical advance directive? No - Patient declined  Copy of [place] in Chart? Yes  Pre-existing out of facility DNR order (yellow form or pink MOST form) Yellow form  placed in chart (order not valid for inpatient use);Pink MOST form placed in chart (order not valid for inpatient use)     Chief Complaint  Patient presents with  . Medical Management of Chronic Issues    HPI:  Pt is a 83 y.o. female seen today for medical management of chronic diseases.      Hx of CHF compensated clinically while on Torsemide 10mg , Kcl 13meq daily, urinary incontinency, using adult brief for management,  CKD creat 2.7 09/05/16. Blood pressure is not controlled on Carvedilol 6.25 mg bid, Clonidine 0.1mg  bid, Imdur 60mg  daily. Her mood is stable while on Mirtazapine 7.5mg  daily. X-ray R knee showed Advanced lateral compartment arthropathy which could be due to advanced osteoarthritis or previous AVN. Large knee joint effusion. Probable small intra-articular loose bodies.taking Tylenol 650mg  bid prn for knee pain. Anemia, last Hgb 8.8 09/17/16, she takes Vit B12, Folate  Past Medical History:  Diagnosis Date  . Arthritis    Osteoarthritis  . Cancer of left breast [facility]) August 2016  . Cerebral atherosclerosis 12/19/2012  . Cerebral atrophy 12/19/2012  . Cerebral ischemia 12/19/2012   Microvascular   . CKD (chronic kidney disease) stage 3, GFR 30-59 ml/min 11/03/2014  . Cough   . Dementia   . Diverticulosis of colon (without mention of hemorrhage)   . Edema   . Flat feet   . GERD (gastroesophageal reflux disease)   . Glaucoma   . Heart murmur   . Hyperlipidemia   . Hypertension   . Hypertonicity of bladder   . Ischemic cardiomyopathy 11/04/2014  . Lumbago   . Memory change 03/28/2016  . Osteoarthrosis, unspecified whether generalized or localized, unspecified site   . Other malaise and fatigue   . Pain in joint, pelvic region and thigh   . Pain in joint, shoulder region   .  Personal history of fall   . RLQ abdominal pain 01/17/2011  . Senile osteoporosis   . Small bowel obstruction ([place])   . STEMI (ST elevation myocardial infarction) ([place]) 11/04/2014  . Unstable  gait 11/04/2014  . Urine, incontinence, stress female 09/18/2014   Past Surgical History:  Procedure Laterality Date  . BREAST LUMPECTOMY Bilateral   . BREAST LUMPECTOMY WITH RADIOACTIVE SEED LOCALIZATION Left 03/15/2015   Procedure: LEFT BREAST LUMPECTOMY WITH RADIOACTIVE SEED LOCALIZATION;  Surgeon: Fanny Skates, MD;  Location: [place];  Service: General;  Laterality: Left;  . CARDIAC CATHETERIZATION N/A 11/08/2014   Procedure: Left Heart Cath and Coronary Angiography;  Surgeon: [name], MD;  Location: [place] CV LAB;  Service: Cardiovascular;  Laterality: N/A;  . COLON SURGERY  02/1996   Colectomy, partial for diverticular bleed 90%  . EYE SURGERY    . JOINT REPLACEMENT Left 12/2000   Left knee; Dr. Wynelle Link  . LAPAROSCOPIC LYSIS OF ADHESIONS  11/15/10   Exploratory  . TONSILLECTOMY      Allergies  Allergen Reactions  . Sulfa Antibiotics Itching    Outpatient Encounter Prescriptions as of 09/25/2016  Medication Sig  . acetaminophen (TYLENOL) 325 MG tablet Take 650 mg by mouth 2 (two) times daily. Take 2 tablets every 6 hours as needed for mild pain or fever  . alum & mag hydroxide-simeth (MAALOX/MYLANTA) 200-200-20 MG/5ML suspension Take by mouth. 37ml every 4 hours as needed for indigestion  . atorvastatin (LIPITOR) 20 MG tablet Take one tablet by mouth once daily for cholesterol  . BETIMOL 0.5 % ophthalmic solution Place 1 drop into both eyes every morning.   . carvedilol (COREG) 6.25 MG tablet Take 6.25 mg by mouth 2 (two) times daily.   . cloNIDine (CATAPRES) 0.1 MG tablet Take 0.1 mg by mouth 2 (two) times daily.  . Coenzyme Q10 50 MG TABS Take one tablet by mouth at bedtime  . famotidine (PEPCID) 20 MG tablet Take 20 mg by mouth daily.  . folic acid (FOLVITE) 1 MG tablet Take 1 mg by mouth daily.  . Hypromellose (ARTIFICIAL TEARS OP) Apply 2 drops to eye 2 (two) times daily. Both eyes  . isosorbide mononitrate (IMDUR) 60 MG 24 hr tablet Take 60 mg by mouth at bedtime.    Marland Kitchen KLOR-CON 10 10 MEQ tablet Take 10 mEq by mouth daily.  Marland Kitchen latanoprost (XALATAN) 0.005 % ophthalmic solution Place 1 drop into both eyes at bedtime.   . mirtazapine (REMERON) 7.5 MG tablet TAKE 1 TABLET (7.5 MG TOTAL) BY MOUTH AT BEDTIME. NIGHTLY FOR APPETITE  . torsemide (DEMADEX) 10 MG tablet Take 10 mg by mouth daily.  . vitamin B-12 (CYANOCOBALAMIN) 1000 MCG tablet Take 1 tablet by mouth twice daily On Mondays,Wednesdays and Fridays   No facility-administered encounter medications on file as of 09/25/2016.     Review of Systems  Constitutional: Negative.  Negative for activity change and appetite change.  HENT: Positive for hearing loss. Negative for voice change.   Eyes: Negative.  Negative for redness and visual disturbance.  Respiratory: Negative for cough, choking, chest tightness and shortness of breath.   Cardiovascular: Negative for chest pain, palpitations and leg swelling (ankles).  Gastrointestinal: Negative for abdominal distention and abdominal pain.  Genitourinary: Negative for difficulty urinating and dysuria.       Urine incontinence  Musculoskeletal: Positive for arthralgias, back pain and gait problem.       Chronic back pain. Chronic right shoulder pain. Right knee pain. Uses 4  wheel walker.  Skin: Negative.  Negative for rash and wound.       S/p left upper outer lumpectomy.   Neurological: Negative for dizziness and headaches. Weakness: Generalized.  Psychiatric/Behavioral: Negative for agitation, behavioral problems and sleep disturbance. The patient is not nervous/anxious.     Immunization History  Administered Date(s) Administered  . Influenza Whole 11/19/2011, 12/02/2012  . Influenza-Unspecified 12/06/2013, 12/06/2015  . PPD Test 11/10/2014  . Pneumococcal Polysaccharide-23 02/18/2009  . Tdap 04/02/2016   Pertinent  Health Maintenance Due  Topic Date Due  . INFLUENZA VACCINE  09/18/2016  . MAMMOGRAM  02/18/2017 (Originally 10/13/2015)  . PNA vac Low  Risk Adult (2 of 2 - PCV13) 02/18/2017 (Originally 02/18/2010)  . DEXA SCAN  Completed   Fall Risk  07/13/2015 11/17/2014 04/21/2014 12/17/2012  Falls in the past year? No Yes Yes Yes  Number falls in past yr: - 1 1 1   Injury with Fall? - No No Yes  Comment - - - nose lacteration  Risk for fall due to : - Impaired balance/gait;History of fall(s) - -   Functional Status Survey:    Vitals:   09/25/16 0955  BP: 130/82  Pulse: 68  Resp: 16  Temp: 98.6 F (37 C)  Weight: 137 lb (62.1 kg)  Height: 5\' 6"  (1.676 m)   Body mass index is 22.11 kg/m. Physical Exam  Constitutional: She is oriented to person, place, and time. She appears well-developed and well-nourished.  HENT:  Head: Normocephalic and atraumatic.  Eyes: Pupils are equal, round, and reactive to light. Conjunctivae and EOM are normal.  Neck: Normal range of motion. Neck supple.  Cardiovascular: Normal rate and regular rhythm.   Murmur heard.  Systolic murmur is present with a grade of 2/6  Systolic murmurs 4/6  Pulmonary/Chest: Effort normal. She has no wheezes. She has rales (deep rattling in chest. rhonchi.).  Abdominal: Soft. Bowel sounds are normal. She exhibits no distension. There is no tenderness.  Musculoskeletal: Normal range of motion. She exhibits tenderness. She exhibits no edema (1+  bipedal).  Right knee deformity, knee immobilizer when she is out of bed  Neurological: She is alert and oriented to person, place, and time. She has normal reflexes. No cranial nerve deficit.  04/21/14 MMSE 28/30. Failed clock drawing.  Skin: Skin is warm and dry. No rash noted. No erythema.  The left upper outer breast s/p lumpectomy.   Psychiatric: She has a normal mood and affect. Her behavior is normal.    Labs reviewed:  Recent Labs  08/24/16 1053 08/25/16 0105 08/26/16 0427 09/03/16 09/05/16 09/10/16  NA 140 138 138 141 142 140  K 3.8 3.4* 4.0 4.2 4.2 4.1  CL 109 106 105  --   --   --   CO2 21* 22 21*  --   --   --    GLUCOSE 127* 161* 137*  --   --   --   BUN 35* 41* 64* 42* 33* 33*  CREATININE 2.04* 2.20* 2.90* 2.4* 2.5* 2.7*  CALCIUM 8.8* 8.5* 8.2*  --   --   --   MG  --  1.9  --   --   --   --     Recent Labs  04/04/16 07/30/16 08/24/16 1053  AST 12* 15  14 17   ALT 6* 19  6* 9*  ALKPHOS 38 33  36 42  BILITOT  --   --  0.9  PROT  --   --  7.1  ALBUMIN  --   --  3.8    Recent Labs  08/24/16 1053 08/25/16 0105 08/26/16 0427 09/05/16 09/10/16 09/17/16  WBC 10.8* 19.8* 12.9* 9.3 6.0 5.3  NEUTROABS 8.2*  --   --   --   --   --   HGB 12.2 11.1* 10.0* 9.1* 8.7* 8.8*  HCT 36.3 33.0* 30.6* 27* 27* 26*  MCV 89.9 88.2 86.4  --   --   --   PLT 186 190 197 195 178 190   Lab Results  Component Value Date   TSH 2.21 04/04/2016   Lab Results  Component Value Date   HGBA1C 5.6 11/06/2014   Lab Results  Component Value Date   CHOL 126 05/28/2016   HDL 44 05/28/2016   LDLCALC 63 05/28/2016   TRIG 102 05/28/2016   CHOLHDL 3.1 11/06/2014    Significant Diagnostic Results in last 30 days:  No results found.  Assessment/Plan Hypertension Permissive blood pressure control, continue Carvedilol 6.25mg  bid, Clonidine 0.1mg  bid, Imdur 60mg , Torsemide 10mg .   Chronic diastolic congestive heart failure (HCC) Compensated clinically, continue Torsemide 10mg  and Kcl 27meq, 04/04/16 BNP 123.2  GERD (gastroesophageal reflux disease) Stable, continue Famotidine  20mg  daily  CKD (chronic kidney disease) stage 3, GFR 30-59 ml/min Baseline creat 2-3  Anemia 09/17/16 wbc 5.3, Hgb 8.8, plt 190, Iron 46, Vit B12 1017, Folate 4.5(3.4-5.4) borderline, added folic acid 1mg  po daily, CBC one month Continue Vit B12.     Family/ staff Communication: plan of care reviewed with the patient and nurse nurse.   Labs/tests ordered:  None  Time spend: 25 minutes

## 2016-09-30 NOTE — Assessment & Plan Note (Signed)
Baseline creat 2-3

## 2016-09-30 NOTE — Assessment & Plan Note (Signed)
Compensated clinically, continue Torsemide 10mg  and Kcl 35meq, 04/04/16 BNP 123.2

## 2016-10-01 NOTE — Progress Notes (Signed)
Error/disregard

## 2016-10-03 ENCOUNTER — Encounter: Payer: Self-pay | Admitting: Internal Medicine

## 2016-10-03 NOTE — Patient Instructions (Signed)
Opened in error

## 2016-10-03 NOTE — Progress Notes (Signed)
Opened in error

## 2016-10-11 ENCOUNTER — Encounter: Payer: Self-pay | Admitting: Nurse Practitioner

## 2016-10-11 ENCOUNTER — Non-Acute Institutional Stay (SKILLED_NURSING_FACILITY): Payer: Medicare Other | Admitting: Nurse Practitioner

## 2016-10-11 DIAGNOSIS — I10 Essential (primary) hypertension: Secondary | ICD-10-CM

## 2016-10-11 DIAGNOSIS — I5032 Chronic diastolic (congestive) heart failure: Secondary | ICD-10-CM | POA: Diagnosis not present

## 2016-10-11 DIAGNOSIS — R05 Cough: Secondary | ICD-10-CM

## 2016-10-11 DIAGNOSIS — R059 Cough, unspecified: Secondary | ICD-10-CM | POA: Insufficient documentation

## 2016-10-11 DIAGNOSIS — I63239 Cerebral infarction due to unspecified occlusion or stenosis of unspecified carotid arteries: Secondary | ICD-10-CM

## 2016-10-11 NOTE — Progress Notes (Signed)
Location:   [place] Room Number: 5 Place of Service:  SNF (31) Provider: [facility] Mast NP  [name], MD  Patient Care Team: [name], MD as PCP - General (Internal Medicine) Juanita Craver, MD as Consulting Physician (Gastroenterology) Shon Hough, MD as Consulting Physician (Ophthalmology) Thornell Sartorius, MD as Consulting Physician (Otolaryngology) [place] Mast, Man X, NP as Nurse Practitioner (Nurse Practitioner) Bo Merino, MD as Consulting Physician (Rheumatology) Suella Broad, MD as Consulting Physician (Physical Medicine and Rehabilitation) Netta Cedars, MD as Consulting Physician (Orthopedic Surgery) Fanny Skates, MD as Consulting Physician (General Surgery) Truitt Merle, MD as Consulting Physician (Hematology) Thea Silversmith, MD (Inactive) as Consulting Physician (Radiation Oncology) Rockwell Germany, RN as Registered Nurse Mauro Kaufmann, RN as Registered Nurse Jake Shark, Johny Blamer, NP as Nurse Practitioner (Nurse Practitioner) [name], MD as Consulting Physician (Cardiology)  Extended Emergency Contact Information Primary Emergency Contact: [name],[name] Address: [address]          [place] [address] [place] Phone: [phone] Mobile Phone: [phone] Relation: Daughter Secondary Emergency Contact: [name],[name]  [place] Mobile Phone: [phone] Relation: None  Code Status: DNR Goals of care: Advanced Directive information Advanced Directives 10/11/2016  Does Patient Have a Medical Advance Directive? Yes  Type of Paramedic of [place];Out of facility DNR (pink MOST or yellow form)  Does patient want to make changes to medical advance directive? No - Patient declined  Copy of [place] in Chart? Yes  Pre-existing out of facility DNR order (yellow form or pink MOST form) Yellow form placed in chart  (order not valid for inpatient use)     Chief Complaint  Patient presents with  . Acute Visit    Elevated BP 184/90    HPI:  Pt is a 83 y.o. female seen today for an acute visit for elevated blood pressure, 184/90, elevated frequently in this range,  the patient has Hx of HTN, taking Torsemide 10mg  daily, Isosorbide 60mg  daily, Clonidine 0.1mg  bid, Carvedilol 6.25mg  bid. HR in 80s. The patient denied headache, dizziness, change of vision, chest pain/pressure or discomfort, denied palpitation, nausea, vomiting, or constipation.   The patient reported cough at night, woke her up, better when the patient changes her positions,  small amount of clear phlegm produced for a few weeks, she is afebrile, no O2 desaturation.   Hx of CHF, compensated clinically, taking Torsemide 10mg  daily. Hx of CVA with no apparent residual of focal weakness.   Past Medical History:  Diagnosis Date  . Arthritis    Osteoarthritis  . Cancer of left breast [facility]) August 2016  . Cerebral atherosclerosis 12/19/2012  . Cerebral atrophy 12/19/2012  . Cerebral ischemia 12/19/2012   Microvascular   . CKD (chronic kidney disease) stage 3, GFR 30-59 ml/min 11/03/2014  . Cough   . Dementia   . Diverticulosis of colon (without mention of hemorrhage)   . Edema   . Flat feet   . GERD (gastroesophageal reflux disease)   . Glaucoma   . Heart murmur   . Hyperlipidemia   . Hypertension   . Hypertonicity of bladder   . Ischemic cardiomyopathy 11/04/2014  . Lumbago   . Memory change 03/28/2016  . Osteoarthrosis, unspecified whether generalized or localized, unspecified site   . Other malaise and fatigue   . Pain in joint, pelvic region and thigh   . Pain in joint, shoulder region   . Personal history of fall   .  RLQ abdominal pain 01/17/2011  . Senile osteoporosis   . Small bowel obstruction ([place])   . STEMI (ST elevation myocardial infarction) ([place]) 11/04/2014  . Unstable gait 11/04/2014  . Urine, incontinence, stress  female 09/18/2014   Past Surgical History:  Procedure Laterality Date  . BREAST LUMPECTOMY Bilateral   . BREAST LUMPECTOMY WITH RADIOACTIVE SEED LOCALIZATION Left 03/15/2015   Procedure: LEFT BREAST LUMPECTOMY WITH RADIOACTIVE SEED LOCALIZATION;  Surgeon: Fanny Skates, MD;  Location: [place];  Service: General;  Laterality: Left;  . CARDIAC CATHETERIZATION N/A 11/08/2014   Procedure: Left Heart Cath and Coronary Angiography;  Surgeon: [name], MD;  Location: [place] CV LAB;  Service: Cardiovascular;  Laterality: N/A;  . COLON SURGERY  02/1996   Colectomy, partial for diverticular bleed 90%  . EYE SURGERY    . JOINT REPLACEMENT Left 12/2000   Left knee; Dr. Wynelle Link  . LAPAROSCOPIC LYSIS OF ADHESIONS  11/15/10   Exploratory  . TONSILLECTOMY      Allergies  Allergen Reactions  . Sulfa Antibiotics Itching    Allergies as of 10/11/2016      Reactions   Sulfa Antibiotics Itching      Medication List       Accurate as of 10/11/16  3:11 PM. Always use your most recent med list.          acetaminophen 325 MG tablet Commonly known as:  TYLENOL Take 650 mg by mouth 2 (two) times daily. Take 2 tablets every 6 hours as needed for mild pain or fever   alum & mag hydroxide-simeth 200-200-20 MG/5ML suspension Commonly known as:  MAALOX/MYLANTA Take by mouth. 60ml every 4 hours as needed for indigestion   ARTIFICIAL TEARS OP Apply 2 drops to eye 2 (two) times daily. Both eyes   atorvastatin 20 MG tablet Commonly known as:  LIPITOR Take one tablet by mouth once daily for cholesterol   BETIMOL 0.5 % ophthalmic solution Generic drug:  timolol Place 1 drop into both eyes every morning.   carvedilol 6.25 MG tablet Commonly known as:  COREG Take 6.25 mg by mouth 2 (two) times daily.   cloNIDine 0.1 MG tablet Commonly known as:  CATAPRES Take 0.1 mg by mouth 2 (two) times daily.   Coenzyme Q10 50 MG Tabs Take one tablet by mouth at bedtime   famotidine 20 MG  tablet Commonly known as:  PEPCID Take 20 mg by mouth daily.   folic acid 1 MG tablet Commonly known as:  FOLVITE Take 1 mg by mouth daily.   isosorbide mononitrate 60 MG 24 hr tablet Commonly known as:  IMDUR Take 60 mg by mouth at bedtime.   KLOR-CON 10 10 MEQ tablet Generic drug:  potassium chloride Take 10 mEq by mouth daily.   latanoprost 0.005 % ophthalmic solution Commonly known as:  XALATAN Place 1 drop into both eyes at bedtime.   mirtazapine 7.5 MG tablet Commonly known as:  REMERON TAKE 1 TABLET (7.5 MG TOTAL) BY MOUTH AT BEDTIME. NIGHTLY FOR APPETITE   torsemide 10 MG tablet Commonly known as:  DEMADEX Take 10 mg by mouth daily.   vitamin B-12 1000 MCG tablet Commonly known as:  CYANOCOBALAMIN Take 1 tablet by mouth twice daily On Mondays,Wednesdays and Fridays       Review of Systems  Constitutional: Negative for activity change, appetite change, chills, diaphoresis, fatigue and fever.  HENT: Negative for congestion.   Eyes: Negative for pain and visual disturbance.  Respiratory: Positive for cough. Negative  for chest tightness and shortness of breath.        Cough at night for a few weeks, reported small amount of phlegm production.   Cardiovascular: Negative for chest pain, palpitations and leg swelling.  Neurological: Negative for dizziness, facial asymmetry, speech difficulty, weakness, numbness and headaches.  Psychiatric/Behavioral: Negative for agitation, behavioral problems and confusion.    Immunization History  Administered Date(s) Administered  . Influenza Whole 11/19/2011, 12/02/2012  . Influenza-Unspecified 12/06/2013, 12/06/2015  . PPD Test 11/10/2014  . Pneumococcal Polysaccharide-23 02/18/2009  . Tdap 04/02/2016   Pertinent  Health Maintenance Due  Topic Date Due  . INFLUENZA VACCINE  09/18/2016  . MAMMOGRAM  02/18/2017 (Originally 10/13/2015)  . PNA vac Low Risk Adult (2 of 2 - PCV13) 02/18/2017 (Originally 02/18/2010)  . DEXA SCAN   Completed   Fall Risk  07/13/2015 11/17/2014 04/21/2014 12/17/2012  Falls in the past year? No Yes Yes Yes  Number falls in past yr: - 1 1 1   Injury with Fall? - No No Yes  Comment - - - nose lacteration  Risk for fall due to : - Impaired balance/gait;History of fall(s) - -   Functional Status Survey:    Vitals:   10/11/16 1405  BP: (!) 184/90  Pulse: 84  Resp: 18  Temp: 98.5 F (36.9 C)  Weight: 135 lb 11.2 oz (61.6 kg)  Height: 5\' 6"  (1.676 m)   Body mass index is 21.9 kg/m. Physical Exam  Constitutional: She appears well-developed and well-nourished. No distress.  HENT:  Head: Normocephalic and atraumatic.  Eyes: Conjunctivae and EOM are normal.  Neck: Normal range of motion. Neck supple. No JVD present.  Cardiovascular: Normal rate.   Murmur heard. Systolic murmurs 4/6  Pulmonary/Chest: Effort normal. No respiratory distress. She has no wheezes. She has rales.  Right mid posterior lung field  Neurological: She is alert. She displays normal reflexes. No cranial nerve deficit. She exhibits normal muscle tone. Coordination normal.  Oriented to people and place  Skin: She is not diaphoretic.  Psychiatric: She has a normal mood and affect. Her behavior is normal.    Labs reviewed:  Recent Labs  08/24/16 1053 08/25/16 0105 08/26/16 0427 09/03/16 09/05/16 09/10/16  NA 140 138 138 141 142 140  K 3.8 3.4* 4.0 4.2 4.2 4.1  CL 109 106 105  --   --   --   CO2 21* 22 21*  --   --   --   GLUCOSE 127* 161* 137*  --   --   --   BUN 35* 41* 64* 42* 33* 33*  CREATININE 2.04* 2.20* 2.90* 2.4* 2.5* 2.7*  CALCIUM 8.8* 8.5* 8.2*  --   --   --   MG  --  1.9  --   --   --   --     Recent Labs  04/04/16 07/30/16 08/24/16 1053  AST 12* 15  14 17   ALT 6* 19  6* 9*  ALKPHOS 38 33  36 42  BILITOT  --   --  0.9  PROT  --   --  7.1  ALBUMIN  --   --  3.8    Recent Labs  08/24/16 1053 08/25/16 0105 08/26/16 0427 09/05/16 09/10/16 09/17/16  WBC 10.8* 19.8* 12.9* 9.3 6.0  5.3  NEUTROABS 8.2*  --   --   --   --   --   HGB 12.2 11.1* 10.0* 9.1* 8.7* 8.8*  HCT 36.3 33.0* 30.6* 27* 27*  26*  MCV 89.9 88.2 86.4  --   --   --   PLT 186 190 197 195 178 190   Lab Results  Component Value Date   TSH 2.21 04/04/2016   Lab Results  Component Value Date   HGBA1C 5.6 11/06/2014   Lab Results  Component Value Date   CHOL 126 05/28/2016   HDL 44 05/28/2016   LDLCALC 63 05/28/2016   TRIG 102 05/28/2016   CHOLHDL 3.1 11/06/2014    Significant Diagnostic Results in last 30 days:  No results found.  Assessment/Plan: Hypertension Not controlled, 184/90 mmHg, will increase Carvedilol 12.5mg /6.25mg  bid po, monitor Bp+P q shift, re-evaluate in one week.   Chronic diastolic congestive heart failure (HCC) Compensated, continue Torsemide 10mg  po daily.   CVA (cerebral vascular accident) ([place]) Hx of CVA, resolved left facial and limb weakness, 03/16/14 CT head w/o CM showed multiple lacunar infarcts, some of which are new since 10/20/12, but not felt to be acute.   Cough Obtain CXR in setting of elevated blood pressure and history of CHF. Observe the patient, better control blood pressure.     Family/ staff Communication: plan of [place] reviewed with the patient and charge nurse.   Labs/tests ordered:  CXR AP view  Time spend 25 minutes.

## 2016-10-11 NOTE — Assessment & Plan Note (Signed)
Obtain CXR in setting of elevated blood pressure and history of CHF. Observe the patient, better control blood pressure.

## 2016-10-11 NOTE — Assessment & Plan Note (Signed)
Not controlled, 184/90 mmHg, will increase Carvedilol 12.5mg /6.25mg  bid po, monitor Bp+P q shift, re-evaluate in one week.

## 2016-10-11 NOTE — Assessment & Plan Note (Signed)
Compensated, continue Torsemide 10mg  po daily.

## 2016-10-11 NOTE — Progress Notes (Signed)
Location:  [place] Room Number: 5 Place of Service:  SNF (31) Provider:  Mast, Manxie  NP  [name], MD  Patient Care Team: [name], MD as PCP - General (Internal Medicine) Juanita Craver, MD as Consulting Physician (Gastroenterology) Shon Hough, MD as Consulting Physician (Ophthalmology) Thornell Sartorius, MD as Consulting Physician (Otolaryngology) [place] Mast, Man X, NP as Nurse Practitioner (Nurse Practitioner) Bo Merino, MD as Consulting Physician (Rheumatology) Suella Broad, MD as Consulting Physician (Physical Medicine and Rehabilitation) Netta Cedars, MD as Consulting Physician (Orthopedic Surgery) Fanny Skates, MD as Consulting Physician (General Surgery) Truitt Merle, MD as Consulting Physician (Hematology) Thea Silversmith, MD (Inactive) as Consulting Physician (Radiation Oncology) Rockwell Germany, RN as Registered Nurse Mauro Kaufmann, RN as Registered Nurse Jake Shark, Johny Blamer, NP as Nurse Practitioner (Nurse Practitioner) [name], MD as Consulting Physician (Cardiology)  Extended Emergency Contact Information Primary Emergency Contact: [name],[name] Address: [address]          [place] [address] Johnnette Litter of [place] Phone: [phone] Mobile Phone: [phone] Relation: Daughter Secondary Emergency Contact: [name],[name]  [place] Mobile Phone: [phone] Relation: None  Code Status:  DNR Goals of care: Advanced Directive information Advanced Directives 10/11/2016  Does Patient Have a Medical Advance Directive? Yes  Type of Paramedic of [place];Out of facility DNR (pink MOST or yellow form)  Does patient want to make changes to medical advance directive? No - Patient declined  Copy of [place] in Chart? Yes  Pre-existing out of facility DNR order (yellow form or pink MOST form) Yellow form  placed in chart (order not valid for inpatient use)     Chief Complaint  Patient presents with  . Acute Visit    Elevated BP 184/90    HPI:  Pt is a 83 y.o. female seen today for an acute visit for    Past Medical History:  Diagnosis Date  . Arthritis    Osteoarthritis  . Cancer of left breast [facility]) August 2016  . Cerebral atherosclerosis 12/19/2012  . Cerebral atrophy 12/19/2012  . Cerebral ischemia 12/19/2012   Microvascular   . CKD (chronic kidney disease) stage 3, GFR 30-59 ml/min 11/03/2014  . Cough   . Dementia   . Diverticulosis of colon (without mention of hemorrhage)   . Edema   . Flat feet   . GERD (gastroesophageal reflux disease)   . Glaucoma   . Heart murmur   . Hyperlipidemia   . Hypertension   . Hypertonicity of bladder   . Ischemic cardiomyopathy 11/04/2014  . Lumbago   . Memory change 03/28/2016  . Osteoarthrosis, unspecified whether generalized or localized, unspecified site   . Other malaise and fatigue   . Pain in joint, pelvic region and thigh   . Pain in joint, shoulder region   . Personal history of fall   . RLQ abdominal pain 01/17/2011  . Senile osteoporosis   . Small bowel obstruction ([place])   . STEMI (ST elevation myocardial infarction) ([place]) 11/04/2014  . Unstable gait 11/04/2014  . Urine, incontinence, stress female 09/18/2014   Past Surgical History:  Procedure Laterality Date  . BREAST LUMPECTOMY Bilateral   . BREAST LUMPECTOMY WITH RADIOACTIVE SEED LOCALIZATION Left 03/15/2015   Procedure: LEFT BREAST LUMPECTOMY WITH RADIOACTIVE SEED LOCALIZATION;  Surgeon: Fanny Skates, MD;  Location: [place];  Service: General;  Laterality: Left;  . CARDIAC CATHETERIZATION N/A 11/08/2014   Procedure: Left Heart  Cath and Coronary Angiography;  Surgeon: [name], MD;  Location: [place] CV LAB;  Service: Cardiovascular;  Laterality: N/A;  . COLON SURGERY  02/1996   Colectomy, partial for diverticular bleed 90%  . EYE SURGERY    . JOINT  REPLACEMENT Left 12/2000   Left knee; Dr. Wynelle Link  . LAPAROSCOPIC LYSIS OF ADHESIONS  11/15/10   Exploratory  . TONSILLECTOMY      Allergies  Allergen Reactions  . Sulfa Antibiotics Itching    Outpatient Encounter Prescriptions as of 10/11/2016  Medication Sig  . acetaminophen (TYLENOL) 325 MG tablet Take 650 mg by mouth 2 (two) times daily. Take 2 tablets every 6 hours as needed for mild pain or fever  . alum & mag hydroxide-simeth (MAALOX/MYLANTA) 200-200-20 MG/5ML suspension Take by mouth. 43ml every 4 hours as needed for indigestion  . atorvastatin (LIPITOR) 20 MG tablet Take one tablet by mouth once daily for cholesterol  . BETIMOL 0.5 % ophthalmic solution Place 1 drop into both eyes every morning.   . carvedilol (COREG) 6.25 MG tablet Take 6.25 mg by mouth 2 (two) times daily.   . cloNIDine (CATAPRES) 0.1 MG tablet Take 0.1 mg by mouth 2 (two) times daily.  . Coenzyme Q10 50 MG TABS Take one tablet by mouth at bedtime  . famotidine (PEPCID) 20 MG tablet Take 20 mg by mouth daily.  . folic acid (FOLVITE) 1 MG tablet Take 1 mg by mouth daily.  . Hypromellose (ARTIFICIAL TEARS OP) Apply 2 drops to eye 2 (two) times daily. Both eyes  . isosorbide mononitrate (IMDUR) 60 MG 24 hr tablet Take 60 mg by mouth at bedtime.   Marland Kitchen KLOR-CON 10 10 MEQ tablet Take 10 mEq by mouth daily.  Marland Kitchen latanoprost (XALATAN) 0.005 % ophthalmic solution Place 1 drop into both eyes at bedtime.   . mirtazapine (REMERON) 7.5 MG tablet TAKE 1 TABLET (7.5 MG TOTAL) BY MOUTH AT BEDTIME. NIGHTLY FOR APPETITE  . torsemide (DEMADEX) 10 MG tablet Take 10 mg by mouth daily.  . vitamin B-12 (CYANOCOBALAMIN) 1000 MCG tablet Take 1 tablet by mouth twice daily On Mondays,Wednesdays and Fridays   No facility-administered encounter medications on file as of 10/11/2016.     Review of Systems  Immunization History  Administered Date(s) Administered  . Influenza Whole 11/19/2011, 12/02/2012  . Influenza-Unspecified  12/06/2013, 12/06/2015  . PPD Test 11/10/2014  . Pneumococcal Polysaccharide-23 02/18/2009  . Tdap 04/02/2016   Pertinent  Health Maintenance Due  Topic Date Due  . INFLUENZA VACCINE  09/18/2016  . MAMMOGRAM  02/18/2017 (Originally 10/13/2015)  . PNA vac Low Risk Adult (2 of 2 - PCV13) 02/18/2017 (Originally 02/18/2010)  . DEXA SCAN  Completed   Fall Risk  07/13/2015 11/17/2014 04/21/2014 12/17/2012  Falls in the past year? No Yes Yes Yes  Number falls in past yr: - 1 1 1   Injury with Fall? - No No Yes  Comment - - - nose lacteration  Risk for fall due to : - Impaired balance/gait;History of fall(s) - -   Functional Status Survey:    Vitals:   10/11/16 1405  BP: (!) 184/90  Pulse: 84  Resp: 18  Temp: 98.5 F (36.9 C)  Weight: 135 lb 11.2 oz (61.6 kg)  Height: 5\' 6"  (1.676 m)   Body mass index is 21.9 kg/m. Physical Exam  Labs reviewed:  Recent Labs  08/24/16 1053 08/25/16 0105 08/26/16 0427 09/03/16 09/05/16 09/10/16  NA 140 138 138 141 142 140  K  3.8 3.4* 4.0 4.2 4.2 4.1  CL 109 106 105  --   --   --   CO2 21* 22 21*  --   --   --   GLUCOSE 127* 161* 137*  --   --   --   BUN 35* 41* 64* 42* 33* 33*  CREATININE 2.04* 2.20* 2.90* 2.4* 2.5* 2.7*  CALCIUM 8.8* 8.5* 8.2*  --   --   --   MG  --  1.9  --   --   --   --     Recent Labs  04/04/16 07/30/16 08/24/16 1053  AST 12* 15  14 17   ALT 6* 19  6* 9*  ALKPHOS 38 33  36 42  BILITOT  --   --  0.9  PROT  --   --  7.1  ALBUMIN  --   --  3.8    Recent Labs  08/24/16 1053 08/25/16 0105 08/26/16 0427 09/05/16 09/10/16 09/17/16  WBC 10.8* 19.8* 12.9* 9.3 6.0 5.3  NEUTROABS 8.2*  --   --   --   --   --   HGB 12.2 11.1* 10.0* 9.1* 8.7* 8.8*  HCT 36.3 33.0* 30.6* 27* 27* 26*  MCV 89.9 88.2 86.4  --   --   --   PLT 186 190 197 195 178 190   Lab Results  Component Value Date   TSH 2.21 04/04/2016   Lab Results  Component Value Date   HGBA1C 5.6 11/06/2014   Lab Results  Component Value Date   CHOL  126 05/28/2016   HDL 44 05/28/2016   LDLCALC 63 05/28/2016   TRIG 102 05/28/2016   CHOLHDL 3.1 11/06/2014    Significant Diagnostic Results in last 30 days:  No results found.  Assessment/Plan There are no diagnoses linked to this encounter.   Family/ staff Communication:   Labs/tests ordered:

## 2016-10-11 NOTE — Assessment & Plan Note (Signed)
Hx of CVA, resolved left facial and limb weakness, 03/16/14 CT head w/o CM showed multiple lacunar infarcts, some of which are new since 10/20/12, but not felt to be acute.

## 2016-10-17 ENCOUNTER — Non-Acute Institutional Stay (SKILLED_NURSING_FACILITY): Payer: Medicare Other | Admitting: Nurse Practitioner

## 2016-10-17 ENCOUNTER — Encounter: Payer: Self-pay | Admitting: *Deleted

## 2016-10-17 DIAGNOSIS — I5032 Chronic diastolic (congestive) heart failure: Secondary | ICD-10-CM | POA: Diagnosis not present

## 2016-10-17 DIAGNOSIS — D519 Vitamin B12 deficiency anemia, unspecified: Secondary | ICD-10-CM

## 2016-10-17 DIAGNOSIS — I10 Essential (primary) hypertension: Secondary | ICD-10-CM

## 2016-10-17 DIAGNOSIS — F411 Generalized anxiety disorder: Secondary | ICD-10-CM | POA: Diagnosis not present

## 2016-10-17 DIAGNOSIS — D631 Anemia in chronic kidney disease: Secondary | ICD-10-CM | POA: Diagnosis not present

## 2016-10-17 LAB — CBC AND DIFFERENTIAL
HCT: 27 — AB (ref 36–46)
Hemoglobin: 8.8 — AB (ref 12.0–16.0)
Platelets: 186 (ref 150–399)
WBC: 5.1

## 2016-10-17 NOTE — Progress Notes (Signed)
Location:  [place] Room Number: 5 Place of Service:  SNF (31) Provider:  Cincere Zorn, Manxie  NP  [name], MD  Patient Care Team: [name], MD as PCP - General (Internal Medicine) Juanita Craver, MD as Consulting Physician (Gastroenterology) Shon Hough, MD as Consulting Physician (Ophthalmology) Thornell Sartorius, MD as Consulting Physician (Otolaryngology) [place] Kalise Fickett X, NP as Nurse Practitioner (Nurse Practitioner) Bo Merino, MD as Consulting Physician (Rheumatology) Suella Broad, MD as Consulting Physician (Physical Medicine and Rehabilitation) Netta Cedars, MD as Consulting Physician (Orthopedic Surgery) Fanny Skates, MD as Consulting Physician (General Surgery) Truitt Merle, MD as Consulting Physician (Hematology) Thea Silversmith, MD (Inactive) as Consulting Physician (Radiation Oncology) Rockwell Germany, RN as Registered Nurse Mauro Kaufmann, RN as Registered Nurse Jake Shark, Johny Blamer, NP as Nurse Practitioner (Nurse Practitioner) [name], MD as Consulting Physician (Cardiology)  Extended Emergency Contact Information Primary Emergency Contact: [name],[name] Address: [address]          [place] [address] Johnnette Litter of [place] Phone: [phone] Mobile Phone: [phone] Relation: Daughter Secondary Emergency Contact: [name],[name]  [place] Mobile Phone: [phone] Relation: None  Code Status:  DNR Goals of care: Advanced Directive information Advanced Directives 10/17/2016  Does Patient Have a Medical Advance Directive? Yes  Type of Advance Directive Out of facility DNR (pink MOST or yellow form);Living will  Does patient want to make changes to medical advance directive? No - Patient declined  Copy of [place] in Chart? -  Pre-existing out of facility DNR order (yellow form or pink MOST form) Yellow form placed in chart  (order not valid for inpatient use)     Chief Complaint  Patient presents with  . Acute Visit    F/u BP    HPI:  Pt is a 83 y.o. female seen today for an acute visit for elevated blood pressure 10/11/16 184/90 mmHg, 170/86 after meds. She takes Torsemide 10mg  qd, Isosorbide 60mg  qd, Clonidine 0.15mg  bid, Carvedilol 6.25mg  bid. Blood pressure: 128/64, 150/80, 144/78, 130/70 since then. The patient denied chest pain/pressure, palpitation, headache, vision change, nausea, vomiting, SOB. She is afebrile, no O2 desaturation. CXR 10/11/16 showed no significant acute pathology. Hx of CHF, compensated clinically. Hx of anxiety, mood is stable, taking Mirtazapine 7.5mg  daily.    Past Medical History:  Diagnosis Date  . Arthritis    Osteoarthritis  . Cancer of left breast [facility]) August 2016  . Cerebral atherosclerosis 12/19/2012  . Cerebral atrophy 12/19/2012  . Cerebral ischemia 12/19/2012   Microvascular   . CKD (chronic kidney disease) stage 3, GFR 30-59 ml/min 11/03/2014  . Cough   . Dementia   . Diverticulosis of colon (without mention of hemorrhage)   . Edema   . Flat feet   . GERD (gastroesophageal reflux disease)   . Glaucoma   . Heart murmur   . Hyperlipidemia   . Hypertension   . Hypertonicity of bladder   . Ischemic cardiomyopathy 11/04/2014  . Lumbago   . Memory change 03/28/2016  . Osteoarthrosis, unspecified whether generalized or localized, unspecified site   . Other malaise and fatigue   . Pain in joint, pelvic region and thigh   . Pain in joint, shoulder region   . Personal history of fall   . RLQ abdominal pain 01/17/2011  . Senile osteoporosis   . Small bowel obstruction ([place])   . STEMI (ST elevation myocardial infarction) ([place]) 11/04/2014  . Unstable gait 11/04/2014  .  Urine, incontinence, stress female 09/18/2014   Past Surgical History:  Procedure Laterality Date  . BREAST LUMPECTOMY Bilateral   . BREAST LUMPECTOMY WITH RADIOACTIVE SEED LOCALIZATION Left  03/15/2015   Procedure: LEFT BREAST LUMPECTOMY WITH RADIOACTIVE SEED LOCALIZATION;  Surgeon: Fanny Skates, MD;  Location: [place];  Service: General;  Laterality: Left;  . CARDIAC CATHETERIZATION N/A 11/08/2014   Procedure: Left Heart Cath and Coronary Angiography;  Surgeon: [name], MD;  Location: [place] CV LAB;  Service: Cardiovascular;  Laterality: N/A;  . COLON SURGERY  02/1996   Colectomy, partial for diverticular bleed 90%  . EYE SURGERY    . JOINT REPLACEMENT Left 12/2000   Left knee; Dr. Wynelle Link  . LAPAROSCOPIC LYSIS OF ADHESIONS  11/15/10   Exploratory  . TONSILLECTOMY      Allergies  Allergen Reactions  . Sulfa Antibiotics Itching    Outpatient Encounter Prescriptions as of 10/17/2016  Medication Sig  . acetaminophen (TYLENOL) 325 MG tablet Take 650 mg by mouth 2 (two) times daily. Take 2 tablets every 6 hours as needed for mild pain or fever  . alum & mag hydroxide-simeth (MAALOX/MYLANTA) 200-200-20 MG/5ML suspension Take by mouth. 82ml every 4 hours as needed for indigestion  . atorvastatin (LIPITOR) 20 MG tablet Take one tablet by mouth once daily for cholesterol  . BETIMOL 0.5 % ophthalmic solution Place 1 drop into both eyes every morning.   . carvedilol (COREG) 6.25 MG tablet Take 6.25 mg by mouth 2 (two) times daily.   . cloNIDine (CATAPRES) 0.1 MG tablet Take 0.1 mg by mouth 2 (two) times daily.  . Coenzyme Q10 50 MG TABS Take one tablet by mouth at bedtime  . famotidine (PEPCID) 20 MG tablet Take 20 mg by mouth daily.  . folic acid (FOLVITE) 1 MG tablet Take 1 mg by mouth daily.  . Hypromellose (ARTIFICIAL TEARS OP) Apply 2 drops to eye 2 (two) times daily. Both eyes  . isosorbide mononitrate (IMDUR) 60 MG 24 hr tablet Take 60 mg by mouth at bedtime.   Marland Kitchen KLOR-CON 10 10 MEQ tablet Take 10 mEq by mouth daily.  Marland Kitchen latanoprost (XALATAN) 0.005 % ophthalmic solution Place 1 drop into both eyes at bedtime.   . mirtazapine (REMERON) 7.5 MG tablet TAKE 1 TABLET (7.5  MG TOTAL) BY MOUTH AT BEDTIME. NIGHTLY FOR APPETITE  . torsemide (DEMADEX) 10 MG tablet Take 10 mg by mouth daily.  . vitamin B-12 (CYANOCOBALAMIN) 1000 MCG tablet Take 1 tablet by mouth twice daily On Mondays,Wednesdays and Fridays   No facility-administered encounter medications on file as of 10/17/2016.    ROS is provided with assistance of staff Review of Systems  Constitutional: Negative for activity change, appetite change, chills, diaphoresis, fatigue and fever.  HENT: Negative for congestion.   Eyes: Negative for visual disturbance.  Respiratory: Negative for cough, choking, chest tightness and shortness of breath.   Cardiovascular: Positive for leg swelling. Negative for chest pain and palpitations.  Psychiatric/Behavioral: Positive for confusion. Negative for agitation, behavioral problems, hallucinations and sleep disturbance. The patient is not nervous/anxious.        Difficulty memory recalls, repetitive    Immunization History  Administered Date(s) Administered  . Influenza Whole 11/19/2011, 12/02/2012  . Influenza-Unspecified 12/06/2013, 12/06/2015  . PPD Test 11/10/2014  . Pneumococcal Polysaccharide-23 02/18/2009  . Tdap 04/02/2016   Pertinent  Health Maintenance Due  Topic Date Due  . INFLUENZA VACCINE  09/18/2016  . MAMMOGRAM  02/18/2017 (Originally 10/13/2015)  . PNA vac  Low Risk Adult (2 of 2 - PCV13) 02/18/2017 (Originally 02/18/2010)  . DEXA SCAN  Completed   Fall Risk  07/13/2015 11/17/2014 04/21/2014 12/17/2012  Falls in the past year? No Yes Yes Yes  Number falls in past yr: - 1 1 1   Injury with Fall? - No No Yes  Comment - - - nose lacteration  Risk for fall due to : - Impaired balance/gait;History of fall(s) - -   Functional Status Survey:    Vitals:   10/17/16 1027  BP: 130/70  Pulse: 70  Resp: 18  Temp: 98.5 F (36.9 C)  Weight: 141 lb 11.2 oz (64.3 kg)  Height: 5\' 6"  (1.676 m)   Body mass index is 22.87 kg/m. Physical Exam  Constitutional:  She is oriented to person, place, and time. She appears well-developed and well-nourished.  HENT:  Head: Normocephalic and atraumatic.  Eyes: Pupils are equal, round, and reactive to light. Conjunctivae and EOM are normal.  Neck: No JVD present.  Cardiovascular: Normal rate and regular rhythm.   Murmur heard. Pulmonary/Chest: Effort normal. She has no wheezes. She has rales.  Posterior left base moist rales.   Musculoskeletal: She exhibits edema.  Trace edema in ankles.   Neurological: She is alert and oriented to person, place, and time.  Psychiatric: She has a normal mood and affect. Her behavior is normal.    Labs reviewed:  Recent Labs  08/24/16 1053 08/25/16 0105 08/26/16 0427 09/03/16 09/05/16 09/10/16  NA 140 138 138 141 142 140  K 3.8 3.4* 4.0 4.2 4.2 4.1  CL 109 106 105  --   --   --   CO2 21* 22 21*  --   --   --   GLUCOSE 127* 161* 137*  --   --   --   BUN 35* 41* 64* 42* 33* 33*  CREATININE 2.04* 2.20* 2.90* 2.4* 2.5* 2.7*  CALCIUM 8.8* 8.5* 8.2*  --   --   --   MG  --  1.9  --   --   --   --     Recent Labs  04/04/16 07/30/16 08/24/16 1053  AST 12* 15  14 17   ALT 6* 19  6* 9*  ALKPHOS 38 33  36 42  BILITOT  --   --  0.9  PROT  --   --  7.1  ALBUMIN  --   --  3.8    Recent Labs  08/24/16 1053 08/25/16 0105 08/26/16 0427  09/10/16 09/17/16 10/17/16  WBC 10.8* 19.8* 12.9*  < > 6.0 5.3 5.1  NEUTROABS 8.2*  --   --   --   --   --   --   HGB 12.2 11.1* 10.0*  < > 8.7* 8.8* 8.8*  HCT 36.3 33.0* 30.6*  < > 27* 26* 27*  MCV 89.9 88.2 86.4  --   --   --   --   PLT 186 190 197  < > 178 190 186  < > = values in this interval not displayed. Lab Results  Component Value Date   TSH 2.21 04/04/2016   Lab Results  Component Value Date   HGBA1C 5.6 11/06/2014   Lab Results  Component Value Date   CHOL 126 05/28/2016   HDL 44 05/28/2016   LDLCALC 63 05/28/2016   TRIG 102 05/28/2016   CHOLHDL 3.1 11/06/2014    Significant Diagnostic Results in last  30 days:  No results found.  Assessment/Plan Hypertension elevated  blood pressure 10/11/16 184/90 mmHg, 170/86 after meds. She takes Torsemide 10mg  qd, Isosorbide 60mg  qd, Clonidine 0.15mg  bid, Carvedilol 6.25mg  bid. Blood pressure: 128/64, 150/80, 144/78, 130/70 since then. The patient denied chest pain/pressure, palpitation, headache, vision change, nausea, vomiting, SOB. She is afebrile, no O2 desaturation. CXR 10/11/16 showed no significant acute pathology.  Will continue to monitor the patient's blood pressure.   Chronic diastolic congestive heart failure ([place]) Compensated clinically, continue Torsemide 10mg  qd, observe.   Anxiety state Mood is stable, continue Mirtazapine 7.5mg  qd     Family/ staff Communication: plan of care reviewed with the patient and charge nurse.   Labs/tests ordered:  none  Time spend 25 minutes

## 2016-10-18 ENCOUNTER — Other Ambulatory Visit: Payer: Self-pay | Admitting: *Deleted

## 2016-10-18 NOTE — Assessment & Plan Note (Signed)
Mood is stable, continue Mirtazapine 7.5mg  qd

## 2016-10-18 NOTE — Assessment & Plan Note (Signed)
elevated blood pressure 10/11/16 184/90 mmHg, 170/86 after meds. She takes Torsemide 10mg  qd, Isosorbide 60mg  qd, Clonidine 0.15mg  bid, Carvedilol 6.25mg  bid. Blood pressure: 128/64, 150/80, 144/78, 130/70 since then. The patient denied chest pain/pressure, palpitation, headache, vision change, nausea, vomiting, SOB. She is afebrile, no O2 desaturation. CXR 10/11/16 showed no significant acute pathology.  Will continue to monitor the patient's blood pressure.

## 2016-10-18 NOTE — Assessment & Plan Note (Signed)
Compensated clinically, continue Torsemide 10mg  qd, observe.

## 2016-10-29 DIAGNOSIS — Z79899 Other long term (current) drug therapy: Secondary | ICD-10-CM | POA: Diagnosis not present

## 2016-10-29 DIAGNOSIS — E784 Other hyperlipidemia: Secondary | ICD-10-CM | POA: Diagnosis not present

## 2016-10-29 DIAGNOSIS — N184 Chronic kidney disease, stage 4 (severe): Secondary | ICD-10-CM | POA: Diagnosis not present

## 2016-10-29 LAB — LIPID PANEL
Cholesterol: 102 (ref 0–200)
HDL: 47 (ref 35–70)
LDL CALC: 63
LDl/HDL Ratio: 2.2
Triglycerides: 102 (ref 40–160)

## 2016-10-30 ENCOUNTER — Other Ambulatory Visit: Payer: Self-pay | Admitting: *Deleted

## 2016-11-08 ENCOUNTER — Encounter: Payer: Self-pay | Admitting: Internal Medicine

## 2016-11-08 ENCOUNTER — Non-Acute Institutional Stay (SKILLED_NURSING_FACILITY): Payer: Medicare Other | Admitting: Internal Medicine

## 2016-11-08 DIAGNOSIS — E782 Mixed hyperlipidemia: Secondary | ICD-10-CM | POA: Diagnosis not present

## 2016-11-08 DIAGNOSIS — F329 Major depressive disorder, single episode, unspecified: Secondary | ICD-10-CM | POA: Diagnosis not present

## 2016-11-08 DIAGNOSIS — N184 Chronic kidney disease, stage 4 (severe): Secondary | ICD-10-CM | POA: Insufficient documentation

## 2016-11-08 DIAGNOSIS — D638 Anemia in other chronic diseases classified elsewhere: Secondary | ICD-10-CM | POA: Diagnosis not present

## 2016-11-08 DIAGNOSIS — I5032 Chronic diastolic (congestive) heart failure: Secondary | ICD-10-CM | POA: Diagnosis not present

## 2016-11-08 DIAGNOSIS — F32A Depression, unspecified: Secondary | ICD-10-CM

## 2016-11-08 NOTE — Progress Notes (Signed)
Provider:  [name] MD  Location:  [facility] Room Number: 5 Place of Service:  SNF (31)  PCP: [name], MD Patient Care Team: [name], MD as PCP - General (Internal Medicine) Juanita Craver, MD as Consulting Physician (Gastroenterology) Shon Hough, MD as Consulting Physician (Ophthalmology) Thornell Sartorius, MD as Consulting Physician (Otolaryngology) [place], Foley Mast, Man X, NP as Nurse Practitioner (Nurse Practitioner) Bo Merino, MD as Consulting Physician (Rheumatology) Suella Broad, MD as Consulting Physician (Physical Medicine and Rehabilitation) Netta Cedars, MD as Consulting Physician (Orthopedic Surgery) Fanny Skates, MD as Consulting Physician (General Surgery) Truitt Merle, MD as Consulting Physician (Hematology) Thea Silversmith, MD (Inactive) as Consulting Physician (Radiation Oncology) Rockwell Germany, RN as Registered Nurse Mauro Kaufmann, RN as Registered Nurse Jake Shark, Johny Blamer, NP as Nurse Practitioner (Nurse Practitioner) [name], MD as Consulting Physician (Cardiology)  Extended Emergency Contact Information Primary Emergency Contact: [name],[name] Address: [address]          [place] [address] [place] of [place] Phone: [phone] Mobile Phone: [phone] Relation: Daughter Secondary Emergency Contact: Mort Sawyers [place] of [place] Phone: [phone] Relation: None  Code Status: DNR Goals of Care: Advanced Directive information Advanced Directives 11/08/2016  Does Patient Have a Medical Advance Directive? Yes  Type of Advance Directive Out of facility DNR (pink MOST or yellow form)  Does patient want to make changes to medical advance directive? No - Patient declined  Copy of [place] in Chart? -  Pre-existing out of facility DNR order (yellow form or pink MOST form) Yellow form placed in chart (order  not valid for inpatient use)      Chief Complaint  Patient presents with  . Medical Management of Chronic Issues    Routine Visit    HPI: Patient is a 83 y.o. female seen today for routine visit. She is sitting on her wheelchair. She complaints of increased frequency and incontinence with her urine. This has been ongoing for some time per patient and bothers her. Denies any other concerns. She has dementia. No concern from nursing.    Past Medical History:  Diagnosis Date  . Arthritis    Osteoarthritis  . Cancer of left breast [facility]) August 2016  . Cerebral atherosclerosis 12/19/2012  . Cerebral atrophy 12/19/2012  . Cerebral ischemia 12/19/2012   Microvascular   . CKD (chronic kidney disease) stage 3, GFR 30-59 ml/min 11/03/2014  . Cough   . Dementia   . Diverticulosis of colon (without mention of hemorrhage)   . Edema   . Flat feet   . GERD (gastroesophageal reflux disease)   . Glaucoma   . Heart murmur   . Hyperlipidemia   . Hypertension   . Hypertonicity of bladder   . Ischemic cardiomyopathy 11/04/2014  . Lumbago   . Memory change 03/28/2016  . Osteoarthrosis, unspecified whether generalized or localized, unspecified site   . Other malaise and fatigue   . Pain in joint, pelvic region and thigh   . Pain in joint, shoulder region   . Personal history of fall   . RLQ abdominal pain 01/17/2011  . Senile osteoporosis   . Small bowel obstruction ([place])   . STEMI (ST elevation myocardial infarction) ([place]) 11/04/2014  . Unstable gait 11/04/2014  . Urine, incontinence, stress female 09/18/2014   Past Surgical History:  Procedure Laterality Date  . BREAST LUMPECTOMY Bilateral   . BREAST LUMPECTOMY WITH RADIOACTIVE SEED LOCALIZATION Left 03/15/2015  Procedure: LEFT BREAST LUMPECTOMY WITH RADIOACTIVE SEED LOCALIZATION;  Surgeon: Fanny Skates, MD;  Location: [facility];  Service: General;  Laterality: Left;  . CARDIAC CATHETERIZATION N/A 11/08/2014   Procedure: Left Heart Cath and  Coronary Angiography;  Surgeon: [name], MD;  Location: [place] CV LAB;  Service: Cardiovascular;  Laterality: N/A;  . COLON SURGERY  02/1996   Colectomy, partial for diverticular bleed 90%  . EYE SURGERY    . JOINT REPLACEMENT Left 12/2000   Left knee; Dr. Wynelle Link  . LAPAROSCOPIC LYSIS OF ADHESIONS  11/15/10   Exploratory  . TONSILLECTOMY      reports that she quit smoking about 35 years ago. Her smoking use included Cigarettes. She has a 15.00 pack-year smoking history. She has never used smokeless tobacco. She reports that she drinks about 0.6 oz of alcohol per week . She reports that she does not use drugs. Social History   Social History  . Marital status: Widowed    Spouse name: N/A  . Number of children: N/A  . Years of education: N/A   Occupational History  . Not on file.   Social History Main Topics  . Smoking status: Former Smoker    Packs/day: 0.50    Years: 30.00    Types: Cigarettes    Quit date: 12/16/1980  . Smokeless tobacco: Never Used  . Alcohol use 0.6 oz/week    1 Glasses of wine per week     Comment: occasional glass of wine  . Drug use: No  . Sexual activity: No   Other Topics Concern  . Not on file   Social History Narrative   Lives at [facility] 05/2011   Widowed 2003   Living Will   Walks with cane   Never smoked    Exercise none     Functional Status Survey:    Family History  Problem Relation Age of Onset  . Cancer Father 105       colon  . Cancer Maternal Aunt        breast cancer   . Cancer Cousin        breast cancer     Health Maintenance  Topic Date Due  . INFLUENZA VACCINE  11/18/2016 (Originally 09/18/2016)  . MAMMOGRAM  02/18/2017 (Originally 10/13/2015)  . PNA vac Low Risk Adult (2 of 2 - PCV13) 02/18/2017 (Originally 02/18/2010)  . TETANUS/TDAP  04/02/2026  . DEXA SCAN  Completed    Allergies  Allergen Reactions  . Sulfa Antibiotics Itching    Outpatient Encounter Prescriptions as of 11/08/2016    Medication Sig  . acetaminophen (TYLENOL) 325 MG tablet Take 650 mg by mouth 2 (two) times daily. Take 2 tablets every 6 hours as needed for mild pain or fever  . alum & mag hydroxide-simeth (MAALOX/MYLANTA) 200-200-20 MG/5ML suspension Take by mouth. 3ml every 4 hours as needed for indigestion  . atorvastatin (LIPITOR) 20 MG tablet Take one tablet by mouth once daily for cholesterol  . BETIMOL 0.5 % ophthalmic solution Place 1 drop into both eyes every morning.   . carvedilol (COREG) 12.5 MG tablet Take 12.5 mg by mouth 2 (two) times daily with a meal.  . cloNIDine (CATAPRES) 0.1 MG tablet Take 0.1 mg by mouth 2 (two) times daily.  . Coenzyme Q10 50 MG TABS Take one tablet by mouth at bedtime  . famotidine (PEPCID) 20 MG tablet Take 20 mg by mouth daily.  . folic acid (FOLVITE) 1 MG tablet Take 1  mg by mouth daily.  . Hypromellose (ARTIFICIAL TEARS OP) Apply 2 drops to eye 2 (two) times daily. Both eyes  . isosorbide mononitrate (IMDUR) 60 MG 24 hr tablet Take 60 mg by mouth at bedtime.   Marland Kitchen KLOR-CON 10 10 MEQ tablet Take 10 mEq by mouth daily.  Marland Kitchen latanoprost (XALATAN) 0.005 % ophthalmic solution Place 1 drop into both eyes at bedtime.   . mirtazapine (REMERON) 7.5 MG tablet TAKE 1 TABLET (7.5 MG TOTAL) BY MOUTH AT BEDTIME. NIGHTLY FOR APPETITE  . torsemide (DEMADEX) 10 MG tablet Take 10 mg by mouth daily.  . vitamin B-12 (CYANOCOBALAMIN) 1000 MCG tablet Take 1 tablet by mouth twice daily On Mondays,Wednesdays and Fridays  . [DISCONTINUED] carvedilol (COREG) 6.25 MG tablet Take 6.25 mg by mouth 2 (two) times daily.    No facility-administered encounter medications on file as of 11/08/2016.     Review of Systems  Constitutional: Negative for appetite change, chills, diaphoresis, fatigue and fever.  HENT: Positive for hearing loss. Negative for congestion, mouth sores, rhinorrhea, sore throat and trouble swallowing.   Eyes:       Wears glasses  Respiratory: Negative for cough, shortness of  breath and wheezing.   Cardiovascular: Negative for chest pain, palpitations and leg swelling.  Gastrointestinal: Negative for abdominal pain, blood in stool, constipation, diarrhea, nausea and vomiting.       Had bowel movement this am  Genitourinary: Positive for frequency. Negative for dysuria.  Musculoskeletal: Positive for gait problem. Negative for back pain.       Needs 1 person assist with transfers and uses wheelchair to get around the facility. No fall reported.   Skin: Negative for rash and wound.       Bruises easily.   Neurological: Negative for dizziness, syncope and headaches.  Psychiatric/Behavioral: Positive for confusion. Negative for agitation.    Vitals:   11/08/16 1223  BP: 140/80  Pulse: 74  Resp: 16  Temp: 97.7 F (36.5 C)  TempSrc: Oral  Weight: 142 lb 8 oz (64.6 kg)  Height: 5\' 6"  (1.676 m)   Body mass index is 23 kg/m.   Wt Readings from Last 3 Encounters:  11/08/16 142 lb 8 oz (64.6 kg)  10/17/16 141 lb 11.2 oz (64.3 kg)  10/11/16 135 lb 11.2 oz (61.6 kg)   Physical Exam  Constitutional: She appears well-developed and well-nourished. No distress.  HENT:  Head: Normocephalic and atraumatic.  Mouth/Throat: Oropharynx is clear and moist.  Eyes: Pupils are equal, round, and reactive to light. Conjunctivae and EOM are normal.  Neck: Normal range of motion. Neck supple. No JVD present.  Cardiovascular: Normal rate and regular rhythm.   Murmur heard. Pulmonary/Chest: Effort normal and breath sounds normal. No respiratory distress. She has no wheezes. She has no rales.  Abdominal: Soft. Bowel sounds are normal. She exhibits no distension and no mass. There is no tenderness. There is no rebound and no guarding.  Musculoskeletal: Normal range of motion. She exhibits edema and deformity.  Trace ankle edema, limited ROM with her shoulder, on wheelchair, arthritis changes with her fingers  Lymphadenopathy:    She has no cervical adenopathy.  Neurological:  She is alert.  Oriented to self, her family members and place.  Skin: Skin is warm and dry. No rash noted. She is not diaphoretic.  Psychiatric: She has a normal mood and affect.    Labs reviewed: Basic Metabolic Panel:  Recent Labs  08/24/16 1053 08/25/16 0105 08/26/16 0427 09/03/16 09/05/16 09/10/16  NA 140 138 138 141 142 140  K 3.8 3.4* 4.0 4.2 4.2 4.1  CL 109 106 105  --   --   --   CO2 21* 22 21*  --   --   --   GLUCOSE 127* 161* 137*  --   --   --   BUN 35* 41* 64* 42* 33* 33*  CREATININE 2.04* 2.20* 2.90* 2.4* 2.5* 2.7*  CALCIUM 8.8* 8.5* 8.2*  --   --   --   MG  --  1.9  --   --   --   --    Liver Function Tests:  Recent Labs  04/04/16 07/30/16 08/24/16 1053  AST 12* 15  14 17   ALT 6* 19  6* 9*  ALKPHOS 38 33  36 42  BILITOT  --   --  0.9  PROT  --   --  7.1  ALBUMIN  --   --  3.8   No results for input(s): LIPASE, AMYLASE in the last 8760 hours. No results for input(s): AMMONIA in the last 8760 hours. CBC:  Recent Labs  08/24/16 1053 08/25/16 0105 08/26/16 0427  09/10/16 09/17/16 10/17/16  WBC 10.8* 19.8* 12.9*  < > 6.0 5.3 5.1  NEUTROABS 8.2*  --   --   --   --   --   --   HGB 12.2 11.1* 10.0*  < > 8.7* 8.8* 8.8*  HCT 36.3 33.0* 30.6*  < > 27* 26* 27*  MCV 89.9 88.2 86.4  --   --   --   --   PLT 186 190 197  < > 178 190 186  < > = values in this interval not displayed. Cardiac Enzymes:  Recent Labs  08/24/16 1444 08/24/16 2023 08/25/16 0105  TROPONINI 0.46* 0.31* 0.22*   BNP: Invalid input(s): POCBNP Lab Results  Component Value Date   HGBA1C 5.6 11/06/2014   Lab Results  Component Value Date   TSH 2.21 04/04/2016   Lab Results  Component Value Date   VITAMINB12 1,080 (H) 08/16/2014   No results found for: FOLATE Lab Results  Component Value Date   IRON 36 11/10/2014   TIBC 249 (L) 11/10/2014   FERRITIN 65 11/10/2014   Lipid Panel     Component Value Date/Time   CHOL 102 10/29/2016   TRIG 102 10/29/2016   HDL 47  10/29/2016   CHOLHDL 3.1 11/06/2014 0340   VLDL 25 11/06/2014 0340   LDLCALC 63 10/29/2016    Imaging and Procedures obtained prior to SNF admission: Dg Chest Port 1 View  Result Date: 08/24/2016 CLINICAL DATA:  Patient with worsening shortness of breath. EXAM: PORTABLE CHEST 1 VIEW COMPARISON:  Chest radiograph 03/07/2015. FINDINGS: Monitoring leads overlie the patient. Patient is mildly rotated to the right. Cardiac contours upper limits of normal. Interval development of patchy consolidation within the right mid lung with associated heterogeneous opacities right lower lung. Small right pleural effusion. Left lung is clear. Bilateral shoulder joint degenerative changes. IMPRESSION: Interval development of consolidation predominately within the right mid lung with heterogeneous right lower lung opacities favored to represent pneumonia in the appropriate clinical setting. Followup PA and lateral chest X-ray is recommended in 3-4 weeks following trial of antibiotic therapy to ensure resolution and exclude underlying malignancy. Small right pleural effusion. Electronically Signed   By: Lovey Newcomer M.D.   On: 08/24/2016 10:53   Dg Swallowing Func-speech Pathology  Result Date: 08/25/2016 Objective Swallowing Evaluation: Type of  Study: MBS-Modified Barium Swallow Study Patient Details Name: CIANNI MANNY MRN: [id number] Date of Birth: January 29, 1926 Today's Date: 08/25/2016 Time: SLP Start Time (ACUTE ONLY): 1332-SLP Stop Time (ACUTE ONLY): 1348 SLP Time Calculation (min) (ACUTE ONLY): 16 min Past Medical History: Past Medical History: Diagnosis Date . Arthritis   Osteoarthritis . Cancer of left breast [facility]) August 2016 . Cerebral atherosclerosis 12/19/2012 . Cerebral atrophy 12/19/2012 . Cerebral ischemia 12/19/2012  Microvascular  . CKD (chronic kidney disease) stage 3, GFR 30-59 ml/min 11/03/2014 . Cough  . Dementia  . Diverticulosis of colon (without mention of hemorrhage)  . Edema  . Flat feet  . GERD  (gastroesophageal reflux disease)  . Glaucoma  . Heart murmur  . Hyperlipidemia  . Hypertension  . Hypertonicity of bladder  . Ischemic cardiomyopathy 11/04/2014 . Lumbago  . Memory change 03/28/2016 . Osteoarthrosis, unspecified whether generalized or localized, unspecified site  . Other malaise and fatigue  . Pain in joint, pelvic region and thigh  . Pain in joint, shoulder region  . Personal history of fall  . RLQ abdominal pain 01/17/2011 . Senile osteoporosis  . Small bowel obstruction ([place])  . STEMI (ST elevation myocardial infarction) ([place]) 11/04/2014 . Unstable gait 11/04/2014 . Urine, incontinence, stress female 09/18/2014 Past Surgical History: Past Surgical History: Procedure Laterality Date . BREAST LUMPECTOMY Bilateral  . BREAST LUMPECTOMY WITH RADIOACTIVE SEED LOCALIZATION Left 03/15/2015  Procedure: LEFT BREAST LUMPECTOMY WITH RADIOACTIVE SEED LOCALIZATION;  Surgeon: Fanny Skates, MD;  Location: [place];  Service: General;  Laterality: Left; . CARDIAC CATHETERIZATION N/A 11/08/2014  Procedure: Left Heart Cath and Coronary Angiography;  Surgeon: [name], MD;  Location: [place] CV LAB;  Service: Cardiovascular;  Laterality: N/A; . COLON SURGERY  02/1996  Colectomy, partial for diverticular bleed 90% . EYE SURGERY   . JOINT REPLACEMENT Left 12/2000  Left knee; Dr. Wynelle Link . LAPAROSCOPIC LYSIS OF ADHESIONS  11/15/10  Exploratory . TONSILLECTOMY   HPI: ZILDA NO is a 83 y.o. female history of dementia, reflux, hyperlipidemia, hypertension, previous breast cancer here presenting with altered mental status, fever, hypoxia.  Found to have right sided PNA.  No Data Recorded Assessment / Plan / Recommendation CHL IP CLINICAL IMPRESSIONS 08/25/2016 Clinical Impression Pt demonstrates normal oropharyngeal swallow function for age. Mild premature spillage of solids to the valleculae while masticating. No penetration or aspiration. No SLP f/u needed will sign off.  SLP Visit Diagnosis Dysphagia, unspecified  (R13.10) Attention and concentration deficit following -- Frontal lobe and executive function deficit following -- Impact on safety and function Mild aspiration risk   CHL IP TREATMENT RECOMMENDATION 08/25/2016 Treatment Recommendations No treatment recommended at this time   No flowsheet data found. CHL IP DIET RECOMMENDATION 08/25/2016 SLP Diet Recommendations Regular solids;Thin liquid Liquid Administration via Cup;Straw Medication Administration Whole meds with liquid Compensations -- Postural Changes Seated upright at 90 degrees   CHL IP OTHER RECOMMENDATIONS 08/25/2016 Recommended Consults -- Oral Care Recommendations Oral care BID Other Recommendations --   CHL IP FOLLOW UP RECOMMENDATIONS 08/25/2016 Follow up Recommendations None   No flowsheet data found.     CHL IP ORAL PHASE 08/25/2016 Oral Phase WFL Oral - Pudding Teaspoon -- Oral - Pudding Cup -- Oral - Honey Teaspoon -- Oral - Honey Cup -- Oral - Nectar Teaspoon -- Oral - Nectar Cup -- Oral - Nectar Straw -- Oral - Thin Teaspoon -- Oral - Thin Cup -- Oral - Thin Straw -- Oral - Puree -- Oral - Mech Soft --  Oral - Regular -- Oral - Multi-Consistency -- Oral - Pill -- Oral Phase - Comment --  CHL IP PHARYNGEAL PHASE 08/25/2016 Pharyngeal Phase WFL Pharyngeal- Pudding Teaspoon -- Pharyngeal -- Pharyngeal- Pudding Cup -- Pharyngeal -- Pharyngeal- Honey Teaspoon -- Pharyngeal -- Pharyngeal- Honey Cup -- Pharyngeal -- Pharyngeal- Nectar Teaspoon -- Pharyngeal -- Pharyngeal- Nectar Cup -- Pharyngeal -- Pharyngeal- Nectar Straw -- Pharyngeal -- Pharyngeal- Thin Teaspoon -- Pharyngeal -- Pharyngeal- Thin Cup -- Pharyngeal -- Pharyngeal- Thin Straw -- Pharyngeal -- Pharyngeal- Puree -- Pharyngeal -- Pharyngeal- Mechanical Soft -- Pharyngeal -- Pharyngeal- Regular -- Pharyngeal -- Pharyngeal- Multi-consistency -- Pharyngeal -- Pharyngeal- Pill -- Pharyngeal -- Pharyngeal Comment --  No flowsheet data found. No flowsheet data found. DeBlois, Katherene Ponto 08/25/2016, 2:33 PM                Assessment/Plan  ckd stage 4 Monitor renal function  Anemia of chronic disease Low h&h. On review of iron panel form 2016, normal ferritin and iron store with low TIBC suggestive for anemia of chronic disease. Normal MCV.   Hyperlipidemia LDL at goal. Currently on atorvastatin 20 mg daily. Decrease this to 10 mg daily and monitor. Continue coenzyme supplement  CHF Stable. Continue carvedilol 12.5 mg bid, isosorbide mononitrate 60 mg daily and torsemide 10 mg daily. Weight has uptrended but no signs of fluid overload on exam.    Depression Mood appears stable. Appetite good. Change remeron to every other day x 1 week and stop. Monitor mood.   Family/ staff Communication: reviewed care plan with patient and charge nurse.    Labs/tests ordered: none   [name], MD Internal Medicine [facility] [address] [place], [place] [address] Cell Phone (Monday-Friday 8 am - 5 pm): [phone] On Call: [phone] and follow prompts after 5 pm and on weekends Office Phone: [phone] Office Fax: [phone]

## 2016-11-21 ENCOUNTER — Encounter: Payer: Self-pay | Admitting: Adult Health

## 2016-11-21 ENCOUNTER — Non-Acute Institutional Stay (SKILLED_NURSING_FACILITY): Payer: Medicare Other | Admitting: Adult Health

## 2016-11-21 DIAGNOSIS — N6310 Unspecified lump in the right breast, unspecified quadrant: Secondary | ICD-10-CM | POA: Diagnosis not present

## 2016-11-21 NOTE — Progress Notes (Signed)
DATE:  11/21/2016   MRN:  [id number]  BIRTHDAY: Apr 27, 1925  Facility:  Nursing Home Location:  [facility] Room Number: 5  LEVEL OF CARE:  SNF (31)  Contact Information    Name Relation Home Work Mobile   Baldwin Daughter [phone]  [phone]   [name],[name]    [phone]       Code Status History    Date Active Date Inactive Code Status Order ID Comments User Context   08/24/2016  1:38 PM 08/26/2016 10:06 PM Partial Code [id number]  Lavina Hamman, MD ED   11/08/2014 10:45 AM 11/10/2014 10:20 PM Full Code [id number]  [name], MD Inpatient   11/04/2014  7:40 PM 11/06/2014  9:24 PM Full Code [id number]  Luz Brazen, MD ED   08/16/2014  8:07 PM 08/20/2014  1:46 PM Full Code [id number]  Allyne Gee, MD Inpatient   07/18/2012  1:16 PM 07/20/2012  5:53 PM Full Code [id number]  Velvet Bathe, MD Inpatient    Questions for Most Recent Historical Code Status (Order [id number])    Question Answer Comment   In the event of cardiac or respiratory ARREST: Initiate Code Blue, Call Rapid Response No    In the event of cardiac or respiratory ARREST: Perform CPR No    In the event of cardiac or respiratory ARREST: Perform Intubation/Mechanical Ventilation No    In the event of cardiac or respiratory ARREST: Use NIPPV/BiPAp only if indicated Yes    In the event of cardiac or respiratory ARREST: Administer ACLS medications if indicated No    In the event of cardiac or respiratory ARREST: Perform Defibrillation or Cardioversion if indicated No         Advance Directive Documentation     Most Recent Value  Type of Advance Directive  Out of facility DNR (pink MOST or yellow form)  Pre-existing out of facility DNR order (yellow form or pink MOST form)  Yellow form placed in chart (order not valid for inpatient use), Pink MOST form placed in chart (order not valid for inpatient use)  "MOST" Form in Place?  -       Chief Complaint  Patient presents with   . Acute Visit    hard lump on (R) nipple    HISTORY OF PRESENT ILLNESS:  This is an 83 year old female who is being seen for an acute visit. She is a resident of [facility]. She was seen in the room today. She was reported to have lump on right breast. Right inner upper quadrant breast noted to have a hard mass, non-tender. No drainage on her nipple. She has PMH of dementia, combined systolic and diastolic CHF, HTN, edema, NSTEMI and malignant neoplasm of upper outer quadrant of left breast.    PAST MEDICAL HISTORY:  Past Medical History:  Diagnosis Date  . Arthritis    Osteoarthritis  . Cancer of left breast [facility]) August 2016  . Cerebral atherosclerosis 12/19/2012  . Cerebral atrophy 12/19/2012  . Cerebral ischemia 12/19/2012   Microvascular   . CKD (chronic kidney disease) stage 3, GFR 30-59 ml/min (HCC) 11/03/2014  . Cough   . Dementia   . Diverticulosis of colon (without mention of hemorrhage)   . Edema   . Flat feet   . GERD (gastroesophageal reflux disease)   . Glaucoma   . Heart murmur   . Hyperlipidemia   . Hypertension   . Hypertonicity of bladder   . Ischemic cardiomyopathy 11/04/2014  .  Lumbago   . Memory change 03/28/2016  . Osteoarthrosis, unspecified whether generalized or localized, unspecified site   . Other malaise and fatigue   . Pain in joint, pelvic region and thigh   . Pain in joint, shoulder region   . Personal history of fall   . RLQ abdominal pain 01/17/2011  . Senile osteoporosis   . Small bowel obstruction ([place])   . STEMI (ST elevation myocardial infarction) ([place]) 11/04/2014  . Unstable gait 11/04/2014  . Urine, incontinence, stress female 09/18/2014     CURRENT MEDICATIONS: Reviewed  Patient's Medications  New Prescriptions   No medications on file  Previous Medications   ACETAMINOPHEN (TYLENOL) 325 MG TABLET    Take 650 mg by mouth 2 (two) times daily. Take 2 tablets every 6 hours as needed for mild pain or fever   ALUM & MAG  HYDROXIDE-SIMETH (MAALOX/MYLANTA) 200-200-20 MG/5ML SUSPENSION    Take by mouth. 47ml every 4 hours as needed for indigestion   ATORVASTATIN (LIPITOR) 10 MG TABLET    Take 10 mg by mouth daily.   BETIMOL 0.5 % OPHTHALMIC SOLUTION    Place 1 drop into both eyes every morning.    CARVEDILOL (COREG) 12.5 MG TABLET    Take 12.5 mg by mouth 2 (two) times daily with a meal.   CLONIDINE (CATAPRES) 0.1 MG TABLET    Take 0.1 mg by mouth 2 (two) times daily.   COENZYME Q10 50 MG TABS    Take one tablet by mouth at bedtime   FAMOTIDINE (PEPCID) 20 MG TABLET    Take 20 mg by mouth daily.   FOLIC ACID (FOLVITE) 1 MG TABLET    Take 1 mg by mouth daily.   HYPROMELLOSE (ARTIFICIAL TEARS OP)    Apply 2 drops to eye 2 (two) times daily. Both eyes   ISOSORBIDE MONONITRATE (IMDUR) 60 MG 24 HR TABLET    Take 60 mg by mouth at bedtime.    KLOR-CON 10 10 MEQ TABLET    Take 10 mEq by mouth daily.   LATANOPROST (XALATAN) 0.005 % OPHTHALMIC SOLUTION    Place 1 drop into both eyes at bedtime.    TORSEMIDE (DEMADEX) 10 MG TABLET    Take 10 mg by mouth daily.   VITAMIN B-12 (CYANOCOBALAMIN) 1000 MCG TABLET    Take 1 tablet by mouth twice daily On Mondays,Wednesdays and Fridays  Modified Medications   No medications on file  Discontinued Medications   ATORVASTATIN (LIPITOR) 20 MG TABLET    Take one tablet by mouth once daily for cholesterol   MIRTAZAPINE (REMERON) 7.5 MG TABLET    TAKE 1 TABLET (7.5 MG TOTAL) BY MOUTH AT BEDTIME. NIGHTLY FOR APPETITE     Allergies  Allergen Reactions  . Sulfa Antibiotics Itching     REVIEW OF SYSTEMS:  GENERAL: no change in appetite, no fatigue, no weight changes, no fever, chills or weakness MOUTH and THROAT: Denies oral discomfort  RESPIRATORY: no cough, SOB, DOE, wheezing, hemoptysis CARDIAC: no chest pain, edema or palpitations GI: no abdominal pain, diarrhea, constipation, heart burn, nausea or vomiting GU: Denies dysuria, frequency, hematuria, incontinence, or  discharge PSYCHIATRIC: Denies feeling of depression or anxiety. No report of hallucinations, insomnia, paranoia, or agitation   PHYSICAL EXAMINATION  GENERAL APPEARANCE: Well nourished. In no acute distress. Normal body habitus SKIN: Right breast noted to have hard non-tender mass on inner upper quadrant, no nipple drainage MOUTH and THROAT: Lips are without lesions. Oral mucosa is moist and without  lesions. Tongue is normal in shape, size, and color and without lesions RESPIRATORY: breathing is even & unlabored, BS CTAB CARDIAC: RRR, +murmur,no extra heart sounds, no edema GI: abdomen soft, normal BS, no masses, no tenderness, no hepatomegaly, no splenomegaly EXTREMITIES:  Able to move X 4 extremities PSYCHIATRIC: Alert to self, disoriented to time and place. Affect and behavior are appropriate   LABS/RADIOLOGY: Labs reviewed: Basic Metabolic Panel:  Recent Labs  08/24/16 1053 08/25/16 0105 08/26/16 0427 09/03/16 09/05/16 09/10/16  NA 140 138 138 141 142 140  K 3.8 3.4* 4.0 4.2 4.2 4.1  CL 109 106 105  --   --   --   CO2 21* 22 21*  --   --   --   GLUCOSE 127* 161* 137*  --   --   --   BUN 35* 41* 64* 42* 33* 33*  CREATININE 2.04* 2.20* 2.90* 2.4* 2.5* 2.7*  CALCIUM 8.8* 8.5* 8.2*  --   --   --   MG  --  1.9  --   --   --   --    Liver Function Tests:  Recent Labs  04/04/16 07/30/16 08/24/16 1053  AST 12* 15  14 17   ALT 6* 19  6* 9*  ALKPHOS 38 33  36 42  BILITOT  --   --  0.9  PROT  --   --  7.1  ALBUMIN  --   --  3.8   No results for input(s): LIPASE, AMYLASE in the last 8760 hours. No results for input(s): AMMONIA in the last 8760 hours. CBC:  Recent Labs  08/24/16 1053 08/25/16 0105 08/26/16 0427  09/10/16 09/17/16 10/17/16  WBC 10.8* 19.8* 12.9*  < > 6.0 5.3 5.1  NEUTROABS 8.2*  --   --   --   --   --   --   HGB 12.2 11.1* 10.0*  < > 8.7* 8.8* 8.8*  HCT 36.3 33.0* 30.6*  < > 27* 26* 27*  MCV 89.9 88.2 86.4  --   --   --   --   PLT 186 190 197  < >  178 190 186  < > = values in this interval not displayed.  Lipid Panel:  Recent Labs  05/28/16 10/29/16  HDL 44 47   Cardiac Enzymes:  Recent Labs  08/24/16 1444 08/24/16 2023 08/25/16 0105  TROPONINI 0.46* 0.31* 0.22*     ASSESSMENT/PLAN:  1. Breast mass, right - schedule for mammogram bilateral breast, monitor for drainage    Durenda Age - NP  [facility] [phone]

## 2016-11-26 ENCOUNTER — Encounter: Payer: Self-pay | Admitting: *Deleted

## 2016-11-26 ENCOUNTER — Other Ambulatory Visit: Payer: Self-pay | Admitting: Radiology

## 2016-11-26 DIAGNOSIS — C50811 Malignant neoplasm of overlapping sites of right female breast: Secondary | ICD-10-CM | POA: Diagnosis not present

## 2016-11-26 DIAGNOSIS — Z853 Personal history of malignant neoplasm of breast: Secondary | ICD-10-CM | POA: Diagnosis not present

## 2016-11-26 DIAGNOSIS — C773 Secondary and unspecified malignant neoplasm of axilla and upper limb lymph nodes: Secondary | ICD-10-CM | POA: Diagnosis not present

## 2016-11-26 DIAGNOSIS — C50911 Malignant neoplasm of unspecified site of right female breast: Secondary | ICD-10-CM | POA: Diagnosis not present

## 2016-11-26 DIAGNOSIS — R922 Inconclusive mammogram: Secondary | ICD-10-CM | POA: Diagnosis not present

## 2016-11-26 LAB — HM MAMMOGRAPHY

## 2016-11-27 ENCOUNTER — Other Ambulatory Visit: Payer: Self-pay | Admitting: Hematology

## 2016-11-27 ENCOUNTER — Telehealth: Payer: Self-pay | Admitting: *Deleted

## 2016-11-27 NOTE — Telephone Encounter (Signed)
Noted  

## 2016-11-27 NOTE — Telephone Encounter (Signed)
Received fax from [facility]:  Breast Ultrasound-Limited Unilateral-Right Impression: Highly suggestive of malignancy The 4.2cm lobulated mass in the right breast central to the nipple anterior depth is highly suggestive of malignancy. An ultrasound guided biopsy is recommended. The lymph node in the right axilla is at a moderate concern but not classic for malignancy. An ultrasound guided biopsy is recommended.   Biopsies will be performed today 11/26/16--Rick Cornella MD  Findings were given to the patient verbally and in writing at the time of examination. BI-RADS Cat 5   Ultrasound Results faxed to [facility] and placed in Dr. Jackolyn Confer box.

## 2016-11-28 DIAGNOSIS — N183 Chronic kidney disease, stage 3 (moderate): Secondary | ICD-10-CM | POA: Diagnosis not present

## 2016-11-28 LAB — LIPID PANEL
CHOLESTEROL: 108 (ref 0–200)
HDL: 50 (ref 35–70)
LDL Cholesterol: 41
Triglycerides: 85 (ref 40–160)

## 2016-11-29 ENCOUNTER — Non-Acute Institutional Stay (SKILLED_NURSING_FACILITY): Payer: Medicare Other | Admitting: Internal Medicine

## 2016-11-29 ENCOUNTER — Encounter: Payer: Self-pay | Admitting: Internal Medicine

## 2016-11-29 DIAGNOSIS — C50111 Malignant neoplasm of central portion of right female breast: Secondary | ICD-10-CM | POA: Insufficient documentation

## 2016-11-29 DIAGNOSIS — C50911 Malignant neoplasm of unspecified site of right female breast: Secondary | ICD-10-CM

## 2016-11-29 NOTE — Progress Notes (Signed)
Location:  [place] Room Number: 5 Place of Service:  SNF [id number]) Provider:  [name], MD  [name], MD  Patient Care Team: [name], MD as PCP - General (Internal Medicine) Juanita Craver, MD as Consulting Physician (Gastroenterology) Shon Hough, MD as Consulting Physician (Ophthalmology) Thornell Sartorius, MD as Consulting Physician (Otolaryngology) [place], Man X, NP as Nurse Practitioner (Nurse Practitioner) Bo Merino, MD as Consulting Physician (Rheumatology) Suella Broad, MD as Consulting Physician (Physical Medicine and Rehabilitation) Netta Cedars, MD as Consulting Physician (Orthopedic Surgery) Fanny Skates, MD as Consulting Physician (General Surgery) Truitt Merle, MD as Consulting Physician (Hematology) Thea Silversmith, MD (Inactive) as Consulting Physician (Radiation Oncology) Rockwell Germany, RN as Registered Nurse Mauro Kaufmann, RN as Registered Nurse Jake Shark, Johny Blamer, NP as Nurse Practitioner (Nurse Practitioner) [name], MD as Consulting Physician (Cardiology)  Extended Emergency Contact Information Primary Emergency Contact: [name],[name] Address: [address]          [place] [address] Johnnette Litter of [place] Phone: [phone] Mobile Phone: [phone] Relation: Daughter Secondary Emergency Contact: [name],[name]  [place] Mobile Phone: [phone] Relation: None  Code Status: DNR Goals of care: Advanced Directive information Advanced Directives 11/29/2016  Does Patient Have a Medical Advance Directive? Yes  Type of Advance Directive Out of facility DNR (pink MOST or yellow form)  Does patient want to make changes to medical advance directive? No - Patient declined  Copy of [place] in Chart? -  Pre-existing out of facility DNR order (yellow form or pink MOST form) Yellow form placed in chart (order not  valid for inpatient use);Pink MOST form placed in chart (order not valid for inpatient use)     Chief Complaint  Patient presents with  . Acute Visit    Right breast mass    HPI:  Pt is a 83 y.o. female seen today for an acute visit for follow up on right breast mass. She underwent mammogram showing indeterminate mass to right breast. She then had ultrasound followed by biopsy of the breast mass showing invasive ductal carcinoma. She is seen in her room today. She denies any chest wall or breast pain this visit. No drainage from biopsy site reported. She has dementia and this limits her history taking. She has known history of breast cancer to left breast s/p lumpectomy.    Past Medical History:  Diagnosis Date  . Arthritis    Osteoarthritis  . Cancer of left breast [facility]) August 2016  . Cerebral atherosclerosis 12/19/2012  . Cerebral atrophy 12/19/2012  . Cerebral ischemia 12/19/2012   Microvascular   . CKD (chronic kidney disease) stage 3, GFR 30-59 ml/min (HCC) 11/03/2014  . Cough   . Dementia   . Diverticulosis of colon (without mention of hemorrhage)   . Edema   . Flat feet   . GERD (gastroesophageal reflux disease)   . Glaucoma   . Heart murmur   . Hyperlipidemia   . Hypertension   . Hypertonicity of bladder   . Ischemic cardiomyopathy 11/04/2014  . Lumbago   . Memory change 03/28/2016  . Osteoarthrosis, unspecified whether generalized or localized, unspecified site   . Other malaise and fatigue   . Pain in joint, pelvic region and thigh   . Pain in joint, shoulder region   . Personal history of fall   . RLQ abdominal pain 01/17/2011  . Senile osteoporosis   . Small bowel obstruction ([place])   .  STEMI (ST elevation myocardial infarction) ([place]) 11/04/2014  . Unstable gait 11/04/2014  . Urine, incontinence, stress female 09/18/2014   Past Surgical History:  Procedure Laterality Date  . BREAST LUMPECTOMY Bilateral   . BREAST LUMPECTOMY WITH RADIOACTIVE SEED LOCALIZATION  Left 03/15/2015   Procedure: LEFT BREAST LUMPECTOMY WITH RADIOACTIVE SEED LOCALIZATION;  Surgeon: Fanny Skates, MD;  Location: [place];  Service: General;  Laterality: Left;  . CARDIAC CATHETERIZATION N/A 11/08/2014   Procedure: Left Heart Cath and Coronary Angiography;  Surgeon: [name], MD;  Location: [facility];  Service: Cardiovascular;  Laterality: N/A;  . COLON SURGERY  02/1996   Colectomy, partial for diverticular bleed 90%  . EYE SURGERY    . JOINT REPLACEMENT Left 12/2000   Left knee; Dr. Wynelle Link  . LAPAROSCOPIC LYSIS OF ADHESIONS  11/15/10   Exploratory  . TONSILLECTOMY      Allergies  Allergen Reactions  . Sulfa Antibiotics Itching    Outpatient Encounter Prescriptions as of 11/29/2016  Medication Sig  . acetaminophen (TYLENOL) 325 MG tablet Take 650 mg by mouth 2 (two) times daily. Take 2 tablets every 6 hours as needed for mild pain or fever  . alum & mag hydroxide-simeth (MAALOX/MYLANTA) 200-200-20 MG/5ML suspension Take 30 mLs by mouth every 4 (four) hours as needed for indigestion.   Marland Kitchen atorvastatin (LIPITOR) 10 MG tablet Take 10 mg by mouth daily.  Marland Kitchen BETIMOL 0.5 % ophthalmic solution Place 1 drop into both eyes every morning.   . carvedilol (COREG) 12.5 MG tablet Take 12.5 mg by mouth 2 (two) times daily with a meal.  . cloNIDine (CATAPRES) 0.1 MG tablet Take 0.1 mg by mouth 2 (two) times daily.  . Coenzyme Q10 50 MG TABS Take one tablet by mouth at bedtime  . famotidine (PEPCID) 20 MG tablet Take 20 mg by mouth at bedtime.   . folic acid (FOLVITE) 1 MG tablet Take 1 mg by mouth daily.  . Hypromellose (ARTIFICIAL TEARS OP) Apply 2 drops to eye 2 (two) times daily. Both eyes  . isosorbide mononitrate (IMDUR) 60 MG 24 hr tablet Take 60 mg by mouth at bedtime.   Marland Kitchen KLOR-CON 10 10 MEQ tablet Take 10 mEq by mouth daily.  Marland Kitchen latanoprost (XALATAN) 0.005 % ophthalmic solution Place 1 drop into both eyes at bedtime.   . torsemide (DEMADEX) 10 MG tablet Take 10 mg by  mouth daily.  . vitamin B-12 (CYANOCOBALAMIN) 1000 MCG tablet Take 1 tablet by mouth twice daily On Mondays,Wednesdays and Fridays   No facility-administered encounter medications on file as of 11/29/2016.     Review of Systems  Constitutional: Negative for appetite change and fever.  Respiratory: Negative for chest tightness and shortness of breath.   Cardiovascular: Negative for chest pain and palpitations.  Musculoskeletal:       Denies aches and pain  Psychiatric/Behavioral: Positive for confusion.    Immunization History  Administered Date(s) Administered  . Influenza Whole 11/19/2011, 12/02/2012  . Influenza-Unspecified 12/06/2013, 12/06/2015  . PPD Test 11/10/2014  . Pneumococcal Polysaccharide-23 02/18/2009  . Tdap 04/02/2016   Pertinent  Health Maintenance Due  Topic Date Due  . INFLUENZA VACCINE  09/18/2016  . PNA vac Low Risk Adult (2 of 2 - PCV13) 02/18/2017 (Originally 02/18/2010)  . MAMMOGRAM  11/26/2017  . DEXA SCAN  Completed   Fall Risk  07/13/2015 11/17/2014 04/21/2014 12/17/2012  Falls in the past year? No Yes Yes Yes  Number falls in past yr: - 1 1 1  Injury with Fall? - No No Yes  Comment - - - nose lacteration  Risk for fall due to : - Impaired balance/gait;History of fall(s) - -   Functional Status Survey:    There were no vitals filed for this visit. There is no height or weight on file to calculate BMI. Physical Exam  Constitutional: She appears well-developed and well-nourished. No distress.  HENT:  Head: Normocephalic and atraumatic.  Mouth/Throat: Oropharynx is clear and moist.  Eyes: Pupils are equal, round, and reactive to light. Conjunctivae are normal.  Neck: Normal range of motion. Neck supple.  Cardiovascular: Normal rate and regular rhythm.   Pulmonary/Chest: Effort normal and breath sounds normal. She exhibits no tenderness.  Right breast palpable mass, mild erythema but non tender, normal temperature, no discharge from nipple, biopsy  site with dressing, some bruising.   Abdominal: Soft. Bowel sounds are normal. She exhibits no distension.  Musculoskeletal:  Uses a wheelchair  Lymphadenopathy:    She has no cervical adenopathy.  Neurological: She is alert.  Oriented to self only  Skin: Skin is warm and dry. She is not diaphoretic.  Psychiatric: She has a normal mood and affect.    Labs reviewed:  Recent Labs  08/24/16 1053 08/25/16 0105 08/26/16 0427 09/03/16 09/05/16 09/10/16  NA 140 138 138 141 142 140  K 3.8 3.4* 4.0 4.2 4.2 4.1  CL 109 106 105  --   --   --   CO2 21* 22 21*  --   --   --   GLUCOSE 127* 161* 137*  --   --   --   BUN 35* 41* 64* 42* 33* 33*  CREATININE 2.04* 2.20* 2.90* 2.4* 2.5* 2.7*  CALCIUM 8.8* 8.5* 8.2*  --   --   --   MG  --  1.9  --   --   --   --     Recent Labs  04/04/16 07/30/16 08/24/16 1053  AST 12* 15  14 17   ALT 6* 19  6* 9*  ALKPHOS 38 33  36 42  BILITOT  --   --  0.9  PROT  --   --  7.1  ALBUMIN  --   --  3.8    Recent Labs  08/24/16 1053 08/25/16 0105 08/26/16 0427  09/10/16 09/17/16 10/17/16  WBC 10.8* 19.8* 12.9*  < > 6.0 5.3 5.1  NEUTROABS 8.2*  --   --   --   --   --   --   HGB 12.2 11.1* 10.0*  < > 8.7* 8.8* 8.8*  HCT 36.3 33.0* 30.6*  < > 27* 26* 27*  MCV 89.9 88.2 86.4  --   --   --   --   PLT 186 190 197  < > 178 190 186  < > = values in this interval not displayed. Lab Results  Component Value Date   TSH 2.21 04/04/2016   Lab Results  Component Value Date   HGBA1C 5.6 11/06/2014   Lab Results  Component Value Date   CHOL 108 11/28/2016   HDL 50 11/28/2016   LDLCALC 41 11/28/2016   TRIG 85 11/28/2016   CHOLHDL 3.1 11/06/2014    Significant Diagnostic Results in last 30 days:  Ultrasound Guided Biopsy- right breast mass 11/26/16 Impression. Ultrasound guided biopsy of the mass in the right breast central to the nipple anterior depth was successful with no apparent post procedure complications. Pathology indicates malignant  invasive ductal carcinoma. Pathology  results are concordant with imaging findings. A follow up mammogram in 12 months is recommended.   Assessment/Plan  Invasive ductal carcinoma Of right breast. Has history of left breast cancer. Currently denies any symptom. Pt appears in no discomfort. Pending oncology follow up with Dr Dalbert Batman on 12/03/16 for treatment plan. Will follow on recommendation. Monitor biopsy site for bleed.    Family/ staff Communication: reviewed care plan with patient and charge nurse.    Labs/tests ordered:  none  [name], MD Internal Medicine [facility] [address] [place], [place] [address] Cell Phone (Monday-Friday 8 am - 5 pm): [phone] On Call: [phone] and follow prompts after 5 pm and on weekends Office Phone: [phone] Office Fax: [phone]

## 2016-12-03 DIAGNOSIS — Z853 Personal history of malignant neoplasm of breast: Secondary | ICD-10-CM | POA: Diagnosis not present

## 2016-12-03 DIAGNOSIS — C50911 Malignant neoplasm of unspecified site of right female breast: Secondary | ICD-10-CM | POA: Diagnosis not present

## 2016-12-03 DIAGNOSIS — I5030 Unspecified diastolic (congestive) heart failure: Secondary | ICD-10-CM | POA: Diagnosis not present

## 2016-12-03 DIAGNOSIS — C779 Secondary and unspecified malignant neoplasm of lymph node, unspecified: Secondary | ICD-10-CM | POA: Diagnosis not present

## 2016-12-06 ENCOUNTER — Non-Acute Institutional Stay (SKILLED_NURSING_FACILITY): Payer: Medicare Other | Admitting: Nurse Practitioner

## 2016-12-06 ENCOUNTER — Encounter: Payer: Self-pay | Admitting: Nurse Practitioner

## 2016-12-06 DIAGNOSIS — I10 Essential (primary) hypertension: Secondary | ICD-10-CM | POA: Diagnosis not present

## 2016-12-06 DIAGNOSIS — F411 Generalized anxiety disorder: Secondary | ICD-10-CM

## 2016-12-06 DIAGNOSIS — N184 Chronic kidney disease, stage 4 (severe): Secondary | ICD-10-CM

## 2016-12-06 DIAGNOSIS — D519 Vitamin B12 deficiency anemia, unspecified: Secondary | ICD-10-CM

## 2016-12-06 DIAGNOSIS — I5043 Acute on chronic combined systolic (congestive) and diastolic (congestive) heart failure: Secondary | ICD-10-CM

## 2016-12-06 NOTE — Assessment & Plan Note (Signed)
Baseline creat 2-3, update CMP

## 2016-12-06 NOTE — Assessment & Plan Note (Signed)
compensated, taking Torsemide 10mg  qd,

## 2016-12-06 NOTE — Progress Notes (Signed)
Location:  [facility] Room Number: 5 Place of Service:  SNF (31) Provider: Haeley Fordham, [place]  NP  [name], MD  Patient Care Team: [name], MD as PCP - General (Internal Medicine) Juanita Craver, MD as Consulting Physician (Gastroenterology) Shon Hough, MD as Consulting Physician (Ophthalmology) Thornell Sartorius, MD as Consulting Physician (Otolaryngology) [place] Meli Faley X, NP as Nurse Practitioner (Nurse Practitioner) Bo Merino, MD as Consulting Physician (Rheumatology) Suella Broad, MD as Consulting Physician (Physical Medicine and Rehabilitation) Netta Cedars, MD as Consulting Physician (Orthopedic Surgery) Fanny Skates, MD as Consulting Physician (General Surgery) Truitt Merle, MD as Consulting Physician (Hematology) Thea Silversmith, MD (Inactive) as Consulting Physician (Radiation Oncology) Rockwell Germany, RN as Registered Nurse Mauro Kaufmann, RN as Registered Nurse Jake Shark, Johny Blamer, NP as Nurse Practitioner (Nurse Practitioner) [name], MD as Consulting Physician (Cardiology)  Extended Emergency Contact Information Primary Emergency Contact: [name],[name] Address: [address]          [place] [address] Johnnette Litter of [place] Phone: [phone] Mobile Phone: [phone] Relation: Daughter Secondary Emergency Contact: [name],[name]  [place] Mobile Phone: [phone] Relation: None  Code Status:  DNR Goals of care: Advanced Directive information Advanced Directives 12/06/2016  Does Patient Have a Medical Advance Directive? Yes  Type of Advance Directive Out of facility DNR (pink MOST or yellow form)  Does patient want to make changes to medical advance directive? No - Patient declined  Copy of [place] in Chart? Yes  Pre-existing out of facility DNR order (yellow form or pink MOST form) Yellow form placed in chart (order not  valid for inpatient use);Pink MOST form placed in chart (order not valid for inpatient use)     Chief Complaint  Patient presents with  . Medical Management of Chronic Issues    HPI:  Pt is a 83 y.o. female seen today for medical management of chronic diseases.     The patient has history of HTN, blood pressure is controlled, taking Clonidine 0.1mg  bid, Carvedilol 12.5mg  bid, her mood is stable, off Mirtazapine since 11/08/16. CHF, compensated, taking Torsemide 10mg  qd, last Hgb 8.8 10/17/16, taking Vit B12, CKD baseline creat 2-3   Past Medical History:  Diagnosis Date  . Arthritis    Osteoarthritis  . Cancer of left breast [facility]) August 2016  . Cerebral atherosclerosis 12/19/2012  . Cerebral atrophy 12/19/2012  . Cerebral ischemia 12/19/2012   Microvascular   . CKD (chronic kidney disease) stage 3, GFR 30-59 ml/min (HCC) 11/03/2014  . Cough   . Dementia   . Diverticulosis of colon (without mention of hemorrhage)   . Edema   . Flat feet   . GERD (gastroesophageal reflux disease)   . Glaucoma   . Heart murmur   . Hyperlipidemia   . Hypertension   . Hypertonicity of bladder   . Ischemic cardiomyopathy 11/04/2014  . Lumbago   . Memory change 03/28/2016  . Osteoarthrosis, unspecified whether generalized or localized, unspecified site   . Other malaise and fatigue   . Pain in joint, pelvic region and thigh   . Pain in joint, shoulder region   . Personal history of fall   . RLQ abdominal pain 01/17/2011  . Senile osteoporosis   . Small bowel obstruction ([place])   . STEMI (ST elevation myocardial infarction) ([place]) 11/04/2014  . Unstable gait 11/04/2014  . Urine, incontinence, stress female 09/18/2014   Past Surgical History:  Procedure Laterality Date  .  BREAST LUMPECTOMY Bilateral   . BREAST LUMPECTOMY WITH RADIOACTIVE SEED LOCALIZATION Left 03/15/2015   Procedure: LEFT BREAST LUMPECTOMY WITH RADIOACTIVE SEED LOCALIZATION;  Surgeon: Fanny Skates, MD;  Location: [facility];  Service:  General;  Laterality: Left;  . CARDIAC CATHETERIZATION N/A 11/08/2014   Procedure: Left Heart Cath and Coronary Angiography;  Surgeon: [name], MD;  Location: [facility];  Service: Cardiovascular;  Laterality: N/A;  . COLON SURGERY  02/1996   Colectomy, partial for diverticular bleed 90%  . EYE SURGERY    . JOINT REPLACEMENT Left 12/2000   Left knee; Dr. Wynelle Link  . LAPAROSCOPIC LYSIS OF ADHESIONS  11/15/10   Exploratory  . TONSILLECTOMY      Allergies  Allergen Reactions  . Sulfa Antibiotics Itching    Outpatient Encounter Prescriptions as of 12/06/2016  Medication Sig  . acetaminophen (TYLENOL) 325 MG tablet Take 650 mg by mouth 2 (two) times daily. Take 2 tablets every 6 hours as needed for mild pain or fever  . alum & mag hydroxide-simeth (MAALOX/MYLANTA) 200-200-20 MG/5ML suspension Take 30 mLs by mouth every 4 (four) hours as needed for indigestion.   Marland Kitchen atorvastatin (LIPITOR) 10 MG tablet Take 10 mg by mouth daily.  Marland Kitchen BETIMOL 0.5 % ophthalmic solution Place 1 drop into both eyes every morning.   . carvedilol (COREG) 12.5 MG tablet Take 12.5 mg by mouth 2 (two) times daily with a meal.  . cloNIDine (CATAPRES) 0.1 MG tablet Take 0.1 mg by mouth 2 (two) times daily.  . Coenzyme Q10 50 MG TABS Take one tablet by mouth at bedtime  . famotidine (PEPCID) 20 MG tablet Take 20 mg by mouth at bedtime.   . folic acid (FOLVITE) 1 MG tablet Take 1 mg by mouth daily.  . Hypromellose (ARTIFICIAL TEARS OP) Apply 2 drops to eye 2 (two) times daily. Both eyes  . isosorbide mononitrate (IMDUR) 60 MG 24 hr tablet Take 60 mg by mouth at bedtime.   Marland Kitchen KLOR-CON 10 10 MEQ tablet Take 10 mEq by mouth daily.  Marland Kitchen latanoprost (XALATAN) 0.005 % ophthalmic solution Place 1 drop into both eyes at bedtime.   . torsemide (DEMADEX) 10 MG tablet Take 10 mg by mouth daily.  . vitamin B-12 (CYANOCOBALAMIN) 1000 MCG tablet Take 1 tablet by mouth twice daily On Mondays,Wednesdays and Fridays   No  facility-administered encounter medications on file as of 12/06/2016.     Review of Systems  Constitutional: Negative for activity change, appetite change, chills, diaphoresis, fatigue and fever.  HENT: Negative for congestion.   Eyes: Negative for visual disturbance.  Respiratory: Negative for apnea, cough, choking, chest tightness, shortness of breath and wheezing.   Cardiovascular: Negative for chest pain, palpitations and leg swelling.  Gastrointestinal: Negative for abdominal distention, abdominal pain and constipation.  Genitourinary: Negative for difficulty urinating and dysuria.       Urinary leakage.   Musculoskeletal: Positive for arthralgias and gait problem. Negative for back pain.       Knee pain, ambulates with walker  Skin: Negative for color change, pallor, rash and wound.  Neurological: Negative for speech difficulty, weakness and headaches.  Psychiatric/Behavioral: Positive for confusion. Negative for agitation, behavioral problems, hallucinations and sleep disturbance.    Immunization History  Administered Date(s) Administered  . Influenza Whole 11/19/2011, 12/02/2012  . Influenza-Unspecified 12/06/2013, 12/06/2015  . PPD Test 11/10/2014  . Pneumococcal Polysaccharide-23 02/18/2009  . Tdap 04/02/2016   Pertinent  Health Maintenance Due  Topic Date Due  .  INFLUENZA VACCINE  09/18/2016  . PNA vac Low Risk Adult (2 of 2 - PCV13) 02/18/2017 (Originally 02/18/2010)  . MAMMOGRAM  11/26/2017  . DEXA SCAN  Completed   Fall Risk  07/13/2015 11/17/2014 04/21/2014 12/17/2012  Falls in the past year? No Yes Yes Yes  Number falls in past yr: - 1 1 1   Injury with Fall? - No No Yes  Comment - - - nose lacteration  Risk for fall due to : - Impaired balance/gait;History of fall(s) - -   Functional Status Survey:    Vitals:   12/06/16 1004  BP: (!) 158/80  Pulse: 70  Resp: 20  Temp: (!) 97.1 F (36.2 C)  Weight: 144 lb 9.6 oz (65.6 kg)  Height: 5\' 6"  (1.676 m)   Body  mass index is 23.34 kg/m. Physical Exam  Constitutional: She appears well-developed and well-nourished.  HENT:  Head: Normocephalic and atraumatic.  Eyes: Pupils are equal, round, and reactive to light. Conjunctivae and EOM are normal. No scleral icterus.  Neck: Normal range of motion. Neck supple. No thyromegaly present.  Cardiovascular: Normal rate and regular rhythm.   Murmur heard. Pulmonary/Chest: Effort normal and breath sounds normal. She has no wheezes. She has no rales.  Abdominal: Soft. Bowel sounds are normal. She exhibits no distension and no mass. There is no guarding.  Musculoskeletal: Normal range of motion. She exhibits tenderness. She exhibits no edema.  Knee pain R+L  Skin: Skin is warm and dry. No rash noted. No erythema. No pallor.  Psychiatric: Her behavior is normal. Thought content normal.    Labs reviewed:  Recent Labs  08/24/16 1053 08/25/16 0105 08/26/16 0427 09/03/16 09/05/16 09/10/16  NA 140 138 138 141 142 140  K 3.8 3.4* 4.0 4.2 4.2 4.1  CL 109 106 105  --   --   --   CO2 21* 22 21*  --   --   --   GLUCOSE 127* 161* 137*  --   --   --   BUN 35* 41* 64* 42* 33* 33*  CREATININE 2.04* 2.20* 2.90* 2.4* 2.5* 2.7*  CALCIUM 8.8* 8.5* 8.2*  --   --   --   MG  --  1.9  --   --   --   --     Recent Labs  04/04/16 07/30/16 08/24/16 1053  AST 12* 15  14 17   ALT 6* 19  6* 9*  ALKPHOS 38 33  36 42  BILITOT  --   --  0.9  PROT  --   --  7.1  ALBUMIN  --   --  3.8    Recent Labs  08/24/16 1053 08/25/16 0105 08/26/16 0427  09/10/16 09/17/16 10/17/16  WBC 10.8* 19.8* 12.9*  < > 6.0 5.3 5.1  NEUTROABS 8.2*  --   --   --   --   --   --   HGB 12.2 11.1* 10.0*  < > 8.7* 8.8* 8.8*  HCT 36.3 33.0* 30.6*  < > 27* 26* 27*  MCV 89.9 88.2 86.4  --   --   --   --   PLT 186 190 197  < > 178 190 186  < > = values in this interval not displayed. Lab Results  Component Value Date   TSH 2.21 04/04/2016   Lab Results  Component Value Date   HGBA1C 5.6  11/06/2014   Lab Results  Component Value Date   CHOL 108 11/28/2016  HDL 50 11/28/2016   LDLCALC 41 11/28/2016   TRIG 85 11/28/2016   CHOLHDL 3.1 11/06/2014    Significant Diagnostic Results in last 30 days:  No results found.  Assessment/Plan CKD (chronic kidney disease) stage 4, GFR 15-29 ml/min (HCC) Baseline creat 2-3, update CMP  Anemia Last Hgb 8.8 10/17/16, taking Vit B12 daily, will update CBC Vit B12 Iron  Hypertension blood pressure is controlled, taking Clonidine 0.1mg  bid, Carvedilol 12.5mg  bid  Anxiety state er mood is stable, off Mirtazapine since 11/08/16.  Acute on chronic combined systolic and diastolic CHF (congestive heart failure) ([place]) compensated, taking Torsemide 10mg  qd,       Family/ staff Communication: plan of care reviewed with the patient and charge nurse.   Labs/tests ordered:  CBC Vit B12 Iron, CMP  Time spend 25 minutes

## 2016-12-06 NOTE — Assessment & Plan Note (Signed)
Last Hgb 8.8 10/17/16, taking Vit B12 daily, will update CBC Vit B12 Iron

## 2016-12-06 NOTE — Assessment & Plan Note (Signed)
er mood is stable, off Mirtazapine since 11/08/16.

## 2016-12-06 NOTE — Assessment & Plan Note (Signed)
blood pressure is controlled, taking Clonidine 0.1mg  bid, Carvedilol 12.5mg  bid

## 2016-12-10 ENCOUNTER — Telehealth: Payer: Self-pay | Admitting: Hematology

## 2016-12-10 DIAGNOSIS — I10 Essential (primary) hypertension: Secondary | ICD-10-CM | POA: Diagnosis not present

## 2016-12-10 DIAGNOSIS — D631 Anemia in chronic kidney disease: Secondary | ICD-10-CM | POA: Diagnosis not present

## 2016-12-10 DIAGNOSIS — D519 Vitamin B12 deficiency anemia, unspecified: Secondary | ICD-10-CM | POA: Diagnosis not present

## 2016-12-10 DIAGNOSIS — I5043 Acute on chronic combined systolic (congestive) and diastolic (congestive) heart failure: Secondary | ICD-10-CM | POA: Diagnosis not present

## 2016-12-10 LAB — BASIC METABOLIC PANEL
BUN: 37 — AB (ref 4–21)
CREATININE: 2.4 — AB (ref <=1.1)
Glucose: 83
POTASSIUM: 4.2 (ref 3.4–5.3)
Sodium: 138 (ref 137–147)

## 2016-12-10 LAB — CBC AND DIFFERENTIAL
HCT: 29 — AB (ref 36–46)
HEMOGLOBIN: 9.7 — AB (ref 12.0–16.0)
Platelets: 207 (ref 150–399)
WBC: 6.1

## 2016-12-10 LAB — HEPATIC FUNCTION PANEL
ALK PHOS: 37 (ref 25–125)
ALT: 5 — AB (ref 7–35)
AST: 12 — AB (ref 13–35)
Bilirubin, Total: 0.4

## 2016-12-10 LAB — VITAMIN B12: VITAMIN B 12: 1623

## 2016-12-10 NOTE — Telephone Encounter (Signed)
Scheduled appt per 10/23 sch message - patient is aware of apt time and date.

## 2016-12-11 ENCOUNTER — Other Ambulatory Visit: Payer: Self-pay | Admitting: *Deleted

## 2016-12-13 ENCOUNTER — Encounter: Payer: Medicare Other | Admitting: Hematology

## 2016-12-15 NOTE — Progress Notes (Signed)
No show, left a message for pt to call back and reschedule   This encounter was created in error - please disregard.

## 2016-12-18 ENCOUNTER — Telehealth: Payer: Self-pay | Admitting: Hematology

## 2016-12-18 NOTE — Telephone Encounter (Signed)
Mailed calendar to patient. Patient scheduled per 10/28 sch message.

## 2016-12-24 ENCOUNTER — Other Ambulatory Visit: Payer: Self-pay | Admitting: General Surgery

## 2016-12-24 DIAGNOSIS — Z853 Personal history of malignant neoplasm of breast: Secondary | ICD-10-CM | POA: Diagnosis not present

## 2016-12-24 DIAGNOSIS — C779 Secondary and unspecified malignant neoplasm of lymph node, unspecified: Secondary | ICD-10-CM | POA: Diagnosis not present

## 2016-12-24 DIAGNOSIS — C50911 Malignant neoplasm of unspecified site of right female breast: Secondary | ICD-10-CM | POA: Diagnosis not present

## 2016-12-24 DIAGNOSIS — I5030 Unspecified diastolic (congestive) heart failure: Secondary | ICD-10-CM | POA: Diagnosis not present

## 2016-12-24 NOTE — Progress Notes (Signed)
Preop instructions for:    [name]                      Date of Birth   02-Jan-1926                          Date of Procedure:   12/30/2016     Doctor: Fanny Skates  Time to arrive at [facility]:  0700 Report to: Admitting  Procedure:   Any procedure time changes, MD office will notify you!   Do not eat or drink past midnight the night before your procedure.(To include any tube feedings-must be discontinued) Reminder:Follow bowel prep instructions per MD office!   Take these morning medications only with sips of water.(or give through gastrostomy or feeding tube). Am eye drops as usual, Clonidine, Carvedilol    Facility contact:   [facility]                Phone:  [phone]                Health Care POA: daughter- Gwenyth Bouillon   Transportation contact phone#:[phone]  Please send day of procedure:current med list and meds last taken that day, confirm nothing by mouth status from what time, Patient Demographic info( to include DNR status, problem list, allergies)   RN contact name/phone#:   Gillian Shields RN                           and Fax #: [phone]  Bring Insurance card and picture ID Leave all jewelry and other valuables at place where living( no metal or rings to be worn) No contact lens Women-no make-up, no lotions,perfumes,powders   Any questions day of Surgery please call [phone]    Sent from :[facility]                   [place]                   Fax:[phone]  Sent by : Gillian Shields RN

## 2016-12-26 ENCOUNTER — Other Ambulatory Visit: Payer: Self-pay | Admitting: *Deleted

## 2016-12-26 DIAGNOSIS — N184 Chronic kidney disease, stage 4 (severe): Secondary | ICD-10-CM | POA: Diagnosis not present

## 2016-12-26 DIAGNOSIS — I350 Nonrheumatic aortic (valve) stenosis: Secondary | ICD-10-CM | POA: Diagnosis not present

## 2016-12-26 DIAGNOSIS — C50911 Malignant neoplasm of unspecified site of right female breast: Secondary | ICD-10-CM | POA: Diagnosis not present

## 2016-12-26 DIAGNOSIS — I10 Essential (primary) hypertension: Secondary | ICD-10-CM | POA: Diagnosis not present

## 2016-12-26 DIAGNOSIS — C50211 Malignant neoplasm of upper-inner quadrant of right female breast: Secondary | ICD-10-CM | POA: Diagnosis not present

## 2016-12-26 DIAGNOSIS — D631 Anemia in chronic kidney disease: Secondary | ICD-10-CM | POA: Diagnosis not present

## 2016-12-26 DIAGNOSIS — I2510 Atherosclerotic heart disease of native coronary artery without angina pectoris: Secondary | ICD-10-CM | POA: Diagnosis not present

## 2016-12-26 LAB — BASIC METABOLIC PANEL
BUN: 34 — AB (ref 4–21)
CREATININE: 2.6 — AB (ref <=1.1)
Glucose: 93
POTASSIUM: 4.2 (ref 3.4–5.3)
SODIUM: 139 (ref 137–147)

## 2016-12-26 LAB — CBC AND DIFFERENTIAL
HCT: 33 — AB (ref 36–46)
Hemoglobin: 11.1 — AB (ref 12.0–16.0)
Platelets: 228 (ref 150–399)
WBC: 7.8

## 2016-12-26 NOTE — Progress Notes (Signed)
[place]  Telephone:([phone]) [phone] Fax:([phone]  Clinic Follow Up Note   Patient Care Team: [name], MD as PCP - General (Internal Medicine) Juanita Craver, MD as Consulting Physician (Gastroenterology) Shon Hough, MD as Consulting Physician (Ophthalmology) Thornell Sartorius, MD as Consulting Physician (Otolaryngology) [place], Man X, NP as Nurse Practitioner (Nurse Practitioner) Bo Merino, MD as Consulting Physician (Rheumatology) Suella Broad, MD as Consulting Physician (Physical Medicine and Rehabilitation) Netta Cedars, MD as Consulting Physician (Orthopedic Surgery) Fanny Skates, MD as Consulting Physician (General Surgery) Truitt Merle, MD as Consulting Physician (Hematology) Thea Silversmith, MD (Inactive) as Consulting Physician (Radiation Oncology) Rockwell Germany, RN as Registered Nurse Mauro Kaufmann, RN as Registered Nurse Jake Shark, Johny Blamer, NP as Nurse Practitioner (Nurse Practitioner) [name], MD as Consulting Physician (Cardiology) 12/27/2016  CHIEF COMPLAINTS/PURPOSE OF CONSULTATION:  Newly diagnosed right breast cancer, triple negative   Oncology History   Breast cancer of upper-outer quadrant of left female breast   Staging form: Breast, AJCC 7th Edition     Clinical: Stage 0 (Tis (DCIS), N0, M0) - Unsigned       Breast cancer of upper-outer quadrant of left female breast ([place])   09/23/2014 Mammogram    Left breast: new cluster of calcifications in the left breast is indeterminate. Spot magnification views are recommended      10/13/2014 Initial Biopsy    Left breast biopsy: DCIS, high-grade, with calcification and necrosis. ER- (0%), PR- (0%)      10/13/2014 Clinical Stage    Stage 0: Tis N0      03/15/2015 Definitive Surgery    Left lumpectomy: DCIS, high grade, with comedonecrosis and calcifications, spanning 1.5 cm, ALH, ER- (0%), PR- (0%).      03/15/2015 Pathologic  Stage    Stage 0: Tis N0  Diagnosis 03/14/16 Breast, lumpectomy, Left - HIGH GRADE DUCTAL CARCINOMA IN SITU WITH COMEDONECROSIS AND CALCIFICATIONS, SPANNING APPROXIMATELY 1.5 CM IN GREATEST DIMENSION. - ATYPICAL LOBULAR HYPERPLASIA. - DUCTAL CARCINOMA IN SITU IS LESS THAN 0.2 CM IN SEVERAL AREAS TO THE ANTERIOR MARGIN. 1 of 3      03/29/2015 Survivorship    Survivorship care plan completed and mailed to patient in lieu of in person visit       Cancer of central portion of right female breast ([place])   11/26/2016 Mammogram    Diagnostic Mammogram and [place] 11/26/16  IMPRESSION:  The is a 4.2 cm lobulated mass in the right breast central to the nipple anterior depth is highly suggestive of malignancy. The lymph node in the right axilla is at a moderate concern but not classic for malignancy. An ultrasound guided biopsy is recommended.         11/26/2016 Initial Biopsy    Diagnosis 11/26/16 1. Breast, right, needle core biopsy, 12:00 o'clock - INVASIVE DUCTAL CARCINOMA - SEE COMMENT 2. Lymph node, needle/core biopsy, right axilla - METASTATIC CARCINOMA INVOLVING ONE LYMPH NODE       11/26/2016 Receptors her2    Estrogen Receptor: 0%, NEGATIVE Progesterone Receptor: 0%, NEGATIVE Proliferation Marker Ki67: 20% HER2 - NEGATIVE      11/29/2016 Initial Diagnosis    Infiltrating ductal carcinoma of right breast (HCC)       HISTORY OF PRESENTING ILLNESS: 03/14/16 [name] 83 y.o. female with multiple medical comorbidities, as listed below, is here because of recently diagnosed left breast DCIS. She presents to our multidisciplinary breast clinic today with her daughter.  This was discovered by  screening mammogram. She did not have palpable breast mass, she denies any new symptoms lately. She had a stroke earlier this year, which resulted mild left-sided weakness. She is a resident in an assisted living, she is able to walk with a walker, and do some limited self care, but she is not  active, spends most time on sitting during the day. Her daughter states she skips meals sometime, and does not go out often.   CURRENT THERAPY: PENDING Right Mastectomy     INTERVAL HISTORY:  [name] is here for her recent diagnosis of triple negative right breast cancer. She was last seen by me on 10/17/16 for her then left breast DCIS. She presents to the clinic today accompanied by CNA from [facility] living home. I called her daughter Benjamine Mola on the phone and left message to further discuss the patient's condition.   Today she notes she has no pain and she feels fine. She is not the best recorder due to her dementia. According to her assistant she can not walk much but she can transfer herself from different sitting positions. She eats well and gets excellent care from her living center. She has a daughter who lives in [place] was not aware of this appointment.  She notes to having sore arms and sore legs.  She noticed a mass in her right breast 1 months ago and has not had any pain from it.     MEDICAL HISTORY:  Past Medical History:  Diagnosis Date  . Arthritis    Osteoarthritis  . Cancer of left breast [facility]) August 2016  . Cerebral atherosclerosis 12/19/2012  . Cerebral atrophy 12/19/2012  . Cerebral ischemia 12/19/2012   Microvascular   . CKD (chronic kidney disease) stage 3, GFR 30-59 ml/min (HCC) 11/03/2014  . Cough   . Dementia   . Diverticulosis of colon (without mention of hemorrhage)   . Edema   . Flat feet   . GERD (gastroesophageal reflux disease)   . Glaucoma   . Heart murmur   . Hyperlipidemia   . Hypertension   . Hypertonicity of bladder   . Ischemic cardiomyopathy 11/04/2014  . Lumbago   . Memory change 03/28/2016  . Osteoarthrosis, unspecified whether generalized or localized, unspecified site   . Other malaise and fatigue   . Pain in joint, pelvic region and thigh   . Pain in joint, shoulder region   . Personal history of fall   . RLQ  abdominal pain 01/17/2011  . Senile osteoporosis   . Small bowel obstruction ([place])   . STEMI (ST elevation myocardial infarction) ([place]) 11/04/2014  . Unstable gait 11/04/2014  . Urine, incontinence, stress female 09/18/2014    SURGICAL HISTORY: Past Surgical History:  Procedure Laterality Date  . BREAST LUMPECTOMY Bilateral   . COLON SURGERY  02/1996   Colectomy, partial for diverticular bleed 90%  . EYE SURGERY    . JOINT REPLACEMENT Left 12/2000   Left knee; Dr. Wynelle Link  . LAPAROSCOPIC LYSIS OF ADHESIONS  11/15/10   Exploratory  . TONSILLECTOMY     GYN HISTORY  Menarchal: 14 LMP: 52 Contraceptive: HRT: no  G0P0:   SOCIAL HISTORY: Social History   Socioeconomic History  . Marital status: Widowed    Spouse name: Not on file  . Number of children: Not on file  . Years of education: Not on file  . Highest education level: Not on file  Social Needs  . Financial resource strain: Not on file  .  Food insecurity - worry: Not on file  . Food insecurity - inability: Not on file  . Transportation needs - medical: Not on file  . Transportation needs - non-medical: Not on file  Occupational History  . Not on file  Tobacco Use  . Smoking status: Former Smoker    Packs/day: 0.50    Years: 30.00    Pack years: 15.00    Types: Cigarettes    Last attempt to quit: 12/16/1980    Years since quitting: 36.0  . Smokeless tobacco: Never Used  Substance and Sexual Activity  . Alcohol use: Yes    Alcohol/week: 0.6 oz    Types: 1 Glasses of wine per week    Comment: occasional glass of wine  . Drug use: No  . Sexual activity: No  Other Topics Concern  . Not on file  Social History Narrative   Lives at [facility] 05/2011   Widowed 2003   Living Will   [place] with cane   Never smoked    Exercise none     FAMILY HISTORY: Family History  Problem Relation Age of Onset  . Cancer Father 39       colon  . Cancer Maternal Aunt        breast cancer   . Cancer Cousin         breast cancer     ALLERGIES:  is allergic to sulfa antibiotics.  MEDICATIONS:  Current Outpatient Medications  Medication Sig Dispense Refill  . acetaminophen (TYLENOL) 325 MG tablet Take 650 mg by mouth 2 (two) times daily. Take 2 tablets every 6 hours as needed for mild pain or fever    . alum & mag hydroxide-simeth (MAALOX/MYLANTA) 200-200-20 MG/5ML suspension Take 30 mLs by mouth every 4 (four) hours as needed for indigestion.     Marland Kitchen BETIMOL 0.5 % ophthalmic solution Place 1 drop daily into both eyes.     . carvedilol (COREG) 12.5 MG tablet Take 12.5 mg by mouth 2 (two) times daily with a meal.    . cloNIDine (CATAPRES) 0.1 MG tablet Take 0.1 mg by mouth 2 (two) times daily.    . Coenzyme Q10 50 MG TABS Take one tablet by mouth at bedtime    . famotidine (PEPCID) 20 MG tablet Take 20 mg by mouth at bedtime.     . folic acid (FOLVITE) 1 MG tablet Take 1 mg by mouth daily.    . Hypromellose (ARTIFICIAL TEARS OP) Place 2 drops 2 (two) times daily into both eyes.     . isosorbide mononitrate (IMDUR) 60 MG 24 hr tablet Take 60 mg by mouth at bedtime.     Marland Kitchen KLOR-CON 10 10 MEQ tablet Take 10 mEq by mouth daily.    Marland Kitchen latanoprost (XALATAN) 0.005 % ophthalmic solution Place 1 drop into both eyes at bedtime.     . torsemide (DEMADEX) 10 MG tablet Take 10 mg daily by mouth. Hold for SBP < 110    . vitamin B-12 (CYANOCOBALAMIN) 1000 MCG tablet Take 1,000 mcg See admin instructions by mouth. Take 1000 mcg by mouth daily On Mondays,Wednesdays and Fridays     No current facility-administered medications for this visit.     REVIEW OF SYSTEMS:   Constitutional: Denies fevers, chills or abnormal night sweats Eyes: Denies blurriness of vision, double vision or watery eyes Ears, nose, mouth, throat, and face: Denies mucositis or sore throat Respiratory: Denies cough, dyspnea or wheezes Cardiovascular: Denies palpitation, chest discomfort or lower  extremity swelling Gastrointestinal:  Denies  nausea, heartburn or change in bowel habits Skin: Denies abnormal skin rashes Lymphatics: Denies new lymphadenopathy or easy bruising Neurological:Denies numbness, tingling or new weaknesses MSK: (+) sore arms and sore legs (+) wheelchair bound  Breast: (+) palpable mass in right breast  Behavioral/Psych: Mood is stable, no new changes  All other systems were reviewed with the patient and are negative.   PHYSICAL EXAMINATION: ECOG PERFORMANCE STATUS: 3 - Symptomatic, >50% confined to bed  Vitals:   12/27/16 0818  BP: (!) 171/64  Pulse: 67  Resp: 18  Temp: 97.8 F (36.6 C)  SpO2: 96%   Filed Weights   12/27/16 0818  Weight: 144 lb 4.8 oz (65.5 kg)    GENERAL:alert, no distress and comfortable  (+) she is wheelchair bound  SKIN: skin color, texture, turgor are normal, no rashes or significant lesions EYES: normal, conjunctiva are pink and non-injected, sclera clear OROPHARYNX:no exudate, no erythema and lips, buccal mucosa, and tongue normal  NECK: supple, thyroid normal size, non-tender, without nodularity LYMPH:  no palpable lymphadenopathy in the cervical, axillary or inguinal LUNGS: clear to auscultation and percussion with normal breathing effort HEART: regular rate & rhythm and no murmurs and no lower extremity edema ABDOMEN:abdomen soft, non-tender and normal bowel sounds Musculoskeletal:no cyanosis of digits and no clubbing  PSYCH: alert & oriented x 3 with fluent speech NEURO: no focal motor/sensory deficits Breasts: Breast inspection showed them to be symmetrical with no nipple discharge. Small ecchymosis at the biopsy site in left breast. Palpation of the breasts and axilla revealed no obvious mass that I could appreciate. Breast: Exam done in wheelchair: She has a 5x6 cm mass in the central portion of right breast with skin erythema, no open ulcers. She has a 3-4cm palpable lymph node in right axilla.  Left breast and axilla has no palpable mass or  adenopathy.   LABORATORY DATA:  I have reviewed the data as listed Lab Results  Component Value Date   WBC 7.8 12/26/2016   HGB 11.1 (A) 12/26/2016   HCT 33 (A) 12/26/2016   MCV 86.4 08/26/2016   PLT 228 12/26/2016   Recent Labs    07/30/16  08/24/16 1053 08/25/16 0105 08/26/16 0427  09/10/16 12/10/16 12/26/16  NA 139  142   < > 140 138 138   < > 140 138 139  K 4.4  4.2  --  3.8 3.4* 4.0   < > 4.1 4.2 4.2  CL  --   --  109 106 105  --   --   --   --   CO2  --   --  21* 22 21*  --   --   --   --   GLUCOSE  --   --  127* 161* 137*  --   --   --   --   BUN 67*  41*   < > 35* 41* 64*   < > 33* 37* 34*  CREATININE 2.6*  2.1*   < > 2.04* 2.20* 2.90*   < > 2.7* 2.4* 2.6*  CALCIUM  --   --  8.8* 8.5* 8.2*  --   --   --   --   GFRNONAA  --   --  20* 19* 13*  --   --   --   --   GFRAA  --   --  24* 22* 16*  --   --   --   --  PROT  --   --  7.1  --   --   --   --   --   --   ALBUMIN  --   --  3.8  --   --   --   --   --   --   AST 15  14  --  17  --   --   --   --  12*  --   ALT 19  6*  --  9*  --   --   --   --  5*  --   ALKPHOS 33  36  --  42  --   --   --   --  37  --   BILITOT  --   --  0.9  --   --   --   --   --   --    < > = values in this interval not displayed.    PATHOLOGY REPORT:  Diagnosis 11/26/16 1. Breast, right, needle core biopsy, 12:00 o'clock - INVASIVE DUCTAL CARCINOMA - SEE COMMENT 2. Lymph node, needle/core biopsy, right axilla - METASTATIC CARCINOMA INVOLVING ONE LYMPH NODE Microscopic Comment 1. Based on the biopsy, the carcinoma appears Nottingham grade 2 of 3 and measures 0.9 cm in greatest linear extent. Dr. Saralyn Pilar reviewed the case. Prognostic markers (ER/PR/ki-67/HER2-FISH) are pending and will be reported in an addendum. This case was called to Dr. Marcelo Baldy on November 27, 2016. 2. FLUORESCENCE IN-SITU HYBRIDIZATION Results: HER2 - NEGATIVE RATIO OF HER2/CEP17 SIGNALS 1.38 AVERAGE HER2 COPY NUMBER PER CELL 2.00 Reference  Range: NEGATIVE HER2/CEP17 Ratio <2.0 and average HER2 copy number <4.0 EQUIVOCAL HER2/CEP17 Ratio <2.0 and average HER2 copy number >=4.0 and <6.0 POSITIVE HER2/CEP17 Ratio >=2.0 or <2.0 and average HER2 copy number >=6.0 Vicente Males MD Pathologist, Electronic Signature ( Signed 11/29/2016) 2. PROGNOSTIC INDICATORS Results: IMMUNOHISTOCHEMICAL AND MORPHOMETRIC ANALYSIS PERFORMED MANUALLY Estrogen Receptor: 0%, NEGATIVE Progesterone Receptor: 0%, NEGATIVE Proliferation Marker Ki67: 20% COMMENT: The negative hormone receptor study(ies) in this case has no internal positive control.   Diagnosis 03/14/16 Breast, lumpectomy, Left - HIGH GRADE DUCTAL CARCINOMA IN SITU WITH COMEDONECROSIS AND CALCIFICATIONS, SPANNING APPROXIMATELY 1.5 CM IN GREATEST DIMENSION. - ATYPICAL LOBULAR HYPERPLASIA. - DUCTAL CARCINOMA IN SITU IS LESS THAN 0.2 CM IN SEVERAL AREAS TO THE ANTERIOR MARGIN. 1 of 3 FINAL for [place], Sherlyn M ([id number]) Diagnosis(continued) - DUCTAL CARCINOMA IN SITU IS FOCALLY LESS THAN 0.2 CM TO THE SUPERIOR AND POSTERIOR MARGINS. - OTHER MARGINS ARE NEGATIVE. - SEE ONCOLOGY TEMPLATE. Microscopic Comment BREAST, IN SITU CARCINOMA Specimen, including laterality: Left partial breast. Procedure (include lymph node sampling sentinel-non-sentinel): Left breast lumpectomy. Grade of carcinoma: High grade. Necrosis: Yes, comedonecrosis is present. Estimated tumor size: (gross measurement): 1.5 cm. Treatment effect: Not applicable. Distance to closest margin: Ductal carcinoma in situ is less than 0.2 cm from anterior, superior and posterior margins. Breast prognostic profile: Performed on previous case [id number]. Estrogen receptor: 0%, negative. Progesterone receptor: 0%, negative. Lymph nodes: No lymph nodes received. TNM: pTis, pNX. Comments: A p63, smooth muscle myosin and calponin immunohistochemical stain is performed on three blocks (nine stains total) to rule out the  possibility of invasive carcinoma. The staining pattern fails to demonstrate evidence of invasive carcinoma. As estrogen receptor and progesterone receptor were previously negative, these markers will be repeated on the tumor and reported in an addendum to follow. (RH:ds 03/16/15) PROGNOSTIC INDICATORS Results: IMMUNOHISTOCHEMICAL AND MORPHOMETRIC ANALYSIS PERFORMED MANUALLY Estrogen Receptor: 0%, NEGATIVE  Progesterone Receptor: 0%, NEGATIVE COMMENT: The negative hormone receptor study(ies) in this case has an internal positive control.    Diagnosis 10/13/2014 Breast, left, needle core biopsy, calcs - HIGH GRADE DUCTAL CARCINOMA IN SITU WITH CALCIFICATIONS AND NECROSIS. - SEE MICROSCOPIC DESCRIPTION. - FOCAL ATYPICAL LOBULAR HYPERPLASIA. Microscopic Comment Immunohistochemistry for basal cell markers show positivity with Calponin, p63, and smooth muscle myosin supporting the diagnosis of high grade ductal carcinoma in situ. Estrogen and progesterone receptors will be performed. Results: IMMUNOHISTOCHEMICAL AND MORPHOMETRIC ANALYSIS PERFORMED MANUALLY Estrogen Receptor: 0%, NEGATIVE Progesterone Receptor: 0%, NEGATIVE   RADIOGRAPHIC STUDIES: I have personally reviewed the radiological images as listed and agreed with the findings in the report.  Diagnostic Mammogram and [place] 11/26/16  IMPRESSION:  The is a 4.2 cm lobulated mass in the right breast central to the nipple anterior depth is highly suggestive of malignancy. The lymph node in the right axilla is at a moderate concern but not classic for malignancy. An ultrasound guided biopsy is recommended.     Mammogram 09/28/2014 There is a new cluster of pleomorphic calcifications in the left breast at 12:00 middle depth.   ASSESSMENT & PLAN: 83 y.o. Caucasian female, postmenopausal, with multiple comorbidities, including a stroke in January 2016, wheelchair bound, dementia, myocardial infarction, CHF, a resident at assisted living  and h/o of left breast DCIS in 2016.    1. Infiltrating ductal carcinoma of right breast, invasive ductal carcinoma, cT 3N1M0, stage IIIb, G2, triple Negative -I reviewed her image findings and her biopsy results. She has aggressive triple negative breast cancer.  -On physical exam, she has a large central breast mass, with bulky right adenopathy.  This is at least locally advanced, I would like to get a PET scan to rule out metastasis.  Due to the aggressiveness of triple negative breast cancer, risk of systemic recurrence is very high.  -Due to her rapidly growing disease she will undergo right mastectomy with Dr. Dalbert Batman, which is scheduled for next Monday for 12/30/16.  If she does not have surgery, I anticipate she will have skin involvement from the tumor, pain and wound issue which will significantly impact her quality of life. -Given her advanced age and comorbidities, she is not a candidate for chemotherapy.  -Due to her triple negative disease she is not a candidate for antiestrogen therapy.  -I called her daughter Benjamine Mola and discussed the patient's condition and the plan to treat her cancer. She voiced good understanding, and agrees with the plan. -I will follow her after surgery to closely monitor for cancer recurrence.  -F/u in 5 weeks  2. H/o Left breast DCIS, high-grade, ER negative/PR negative, 2016  -Reviewed her scan and breast biopsy results. -The patient had early stage disease. She is considered cured of disease after completing surgical resection.  -She was seen by Dr. Dalbert Batman, lumpectomy without sentinel lymph nodes biopsy was discussed with patient and her daughter. Given her advanced age and medical comorbidities, she does have some risk with anesthesia. Patient and her daughter are in favor of having surgery. Any form of adjuvant treatment is for prevention of disease recurrence.  Given her negative ER and PR status of her tumor, I do not recommend any antiestrogen  therapy.  If she has wide negative surgical margin, she may not benefit much from adjuvant radiation and also, given her advanced age. She was seen by Dr. Pablo Ledger  -She underwent Left Breast Lumpectomy on 03/15/15 by Dr. Dalbert Batman    Plan --She is scheduled to  have a right mastectomy and lymph node dissection by Dr. Dalbert Batman on Monday, December 30, 2016.   -PET scan in 4 weeks  -F/u in 5 weeks    All questions were answered. The patient knows to call the clinic with any problems, questions or concerns. I spent 30 minutes counseling the patient face to face. The total time spent in the appointment was 35 minutes and more than 50% was on counseling.     Truitt Merle, MD 12/27/2016  1:46 PM   This document serves as a record of services personally performed by Truitt Merle, MD. It was created on her behalf by Joslyn Devon, a trained medical scribe. The creation of this record is based on the scribe's personal observations and the provider's statements to them.    I have reviewed the above documentation for accuracy and completeness, and I agree with the above.

## 2016-12-27 ENCOUNTER — Ambulatory Visit (HOSPITAL_BASED_OUTPATIENT_CLINIC_OR_DEPARTMENT_OTHER): Payer: Medicare Other | Admitting: Hematology

## 2016-12-27 ENCOUNTER — Encounter: Payer: Self-pay | Admitting: Hematology

## 2016-12-27 ENCOUNTER — Telehealth: Payer: Self-pay | Admitting: Hematology

## 2016-12-27 VITALS — BP 171/64 | HR 67 | Temp 97.8°F | Resp 18 | Ht 66.0 in | Wt 144.3 lb

## 2016-12-27 DIAGNOSIS — Z171 Estrogen receptor negative status [ER-]: Secondary | ICD-10-CM

## 2016-12-27 DIAGNOSIS — C50111 Malignant neoplasm of central portion of right female breast: Secondary | ICD-10-CM | POA: Diagnosis not present

## 2016-12-27 DIAGNOSIS — C773 Secondary and unspecified malignant neoplasm of axilla and upper limb lymph nodes: Secondary | ICD-10-CM | POA: Diagnosis not present

## 2016-12-27 DIAGNOSIS — I340 Nonrheumatic mitral (valve) insufficiency: Secondary | ICD-10-CM | POA: Diagnosis not present

## 2016-12-27 DIAGNOSIS — I361 Nonrheumatic tricuspid (valve) insufficiency: Secondary | ICD-10-CM | POA: Diagnosis not present

## 2016-12-27 DIAGNOSIS — I350 Nonrheumatic aortic (valve) stenosis: Secondary | ICD-10-CM | POA: Diagnosis not present

## 2016-12-27 DIAGNOSIS — D0512 Intraductal carcinoma in situ of left breast: Secondary | ICD-10-CM

## 2016-12-27 DIAGNOSIS — C50412 Malignant neoplasm of upper-outer quadrant of left female breast: Secondary | ICD-10-CM

## 2016-12-27 NOTE — Telephone Encounter (Signed)
Gave avs and calendar for December  °

## 2016-12-30 ENCOUNTER — Ambulatory Visit (HOSPITAL_COMMUNITY): Admission: RE | Admit: 2016-12-30 | Payer: Medicare Other | Source: Ambulatory Visit | Admitting: General Surgery

## 2016-12-30 SURGERY — MASTECTOMY, MODIFIED RADICAL
Anesthesia: General | Site: Breast | Laterality: Right

## 2017-01-10 ENCOUNTER — Ambulatory Visit (HOSPITAL_COMMUNITY)
Admission: RE | Admit: 2017-01-10 | Discharge: 2017-01-10 | Disposition: A | Discharge status: Home / Self Care | Payer: Medicare Other | Source: Ambulatory Visit | Attending: Hematology | Admitting: Hematology

## 2017-01-10 DIAGNOSIS — I2510 Atherosclerotic heart disease of native coronary artery without angina pectoris: Secondary | ICD-10-CM | POA: Insufficient documentation

## 2017-01-10 DIAGNOSIS — R590 Localized enlarged lymph nodes: Secondary | ICD-10-CM | POA: Insufficient documentation

## 2017-01-10 DIAGNOSIS — N6001 Solitary cyst of right breast: Secondary | ICD-10-CM | POA: Diagnosis not present

## 2017-01-10 DIAGNOSIS — C50111 Malignant neoplasm of central portion of right female breast: Secondary | ICD-10-CM | POA: Insufficient documentation

## 2017-01-10 DIAGNOSIS — J90 Pleural effusion, not elsewhere classified: Secondary | ICD-10-CM | POA: Insufficient documentation

## 2017-01-10 DIAGNOSIS — I517 Cardiomegaly: Secondary | ICD-10-CM | POA: Diagnosis not present

## 2017-01-10 DIAGNOSIS — Z171 Estrogen receptor negative status [ER-]: Secondary | ICD-10-CM | POA: Diagnosis present

## 2017-01-10 DIAGNOSIS — I700 Atherosclerosis of aorta: Secondary | ICD-10-CM | POA: Diagnosis not present

## 2017-01-10 LAB — GLUCOSE, CAPILLARY: Glucose-Capillary: 92 mg/dL (ref 65–99)

## 2017-01-10 IMAGING — PT NM PET TUM IMG INITIAL (PI) SKULL BASE T - THIGH
1 series · 4 of 4 positions shown · non-contrast
Comparison: CT abdomen 11/15/2010

CLINICAL DATA: Initial treatment strategy for right breast cancer.
History of left breast cancer in 6326..

EXAM:
NUCLEAR MEDICINE PET SKULL BASE TO THIGH
TECHNIQUE: 7.1 mCi F-18 FDG was injected intravenously. Full-ring PET imaging
was performed from the skull base to thigh after the radiotracer. CT
data was obtained and used for attenuation correction and anatomic
localization.
FASTING BLOOD GLUCOSE:  Value: 92 mg/dl

[Series 1032: results mm oncology reading · 5.0mm · 0.69mm/px · 4 of 4 slices shown]
[im 1/4]
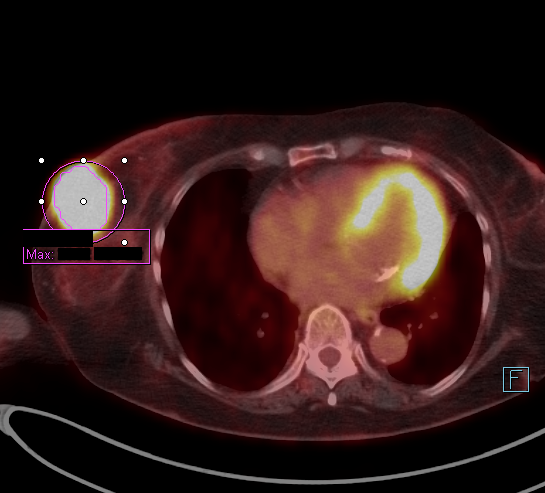
[im 2/4]
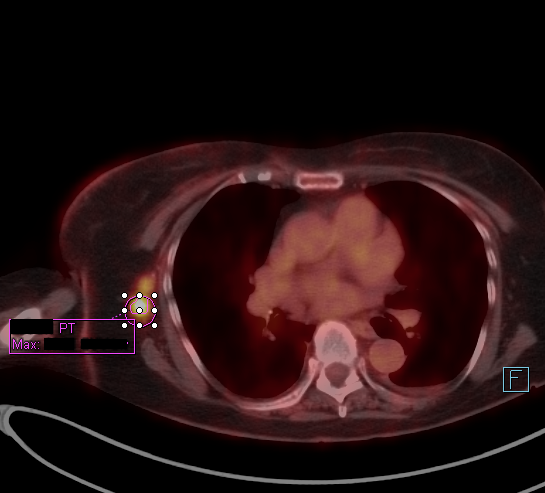
[im 3/4]
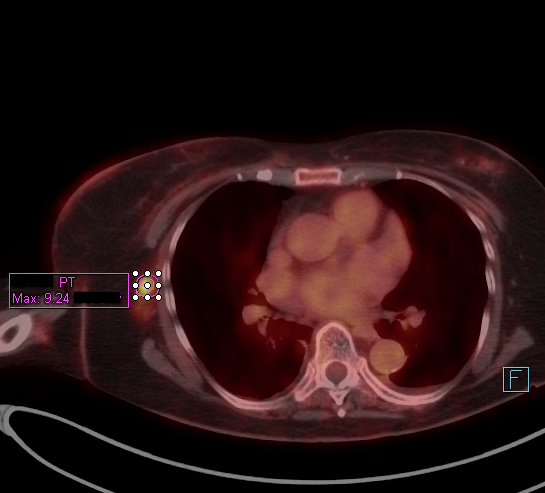
[im 4/4]
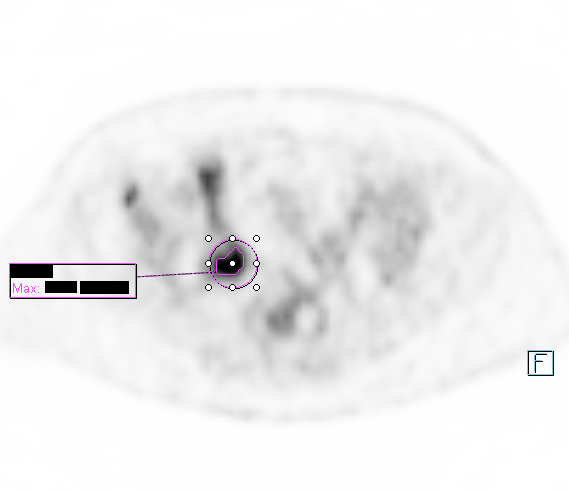

[4 of 4 positions shown; findings below may reference images not displayed]

FINDINGS: NECK

No hypermetabolic lymph nodes in the neck.

Intracranial chronic ischemic microvascular white matter disease.
Chronic left maxillary sinusitis.

CHEST

[DATE] by 4.5 cm right subareolar mass with maximum SUV 23.6.

There are 2 hypermetabolic right axillary lymph nodes. The more
posterior node measures 1.2 cm in short axis on image 59/4 with a
maximum SUV of 11.5. The more anterior lymph node measures 1.2 cm in
short axis on image 61/4 with maximum SUV 9.2.

Trace left pleural effusion. Coronary, aortic arch, and branch
vessel atherosclerotic vascular disease. Mild cardiomegaly. Mild
biapical pleuroparenchymal scarring.

ABDOMEN/PELVIS

No abnormal hypermetabolic activity within the liver, pancreas,
adrenal glands, or spleen. No hypermetabolic lymph nodes in the
abdomen or pelvis.

Physiologic activity in bowel, with focally accentuated activity
near the sigmoid anastomotic site where the maximum SUV is 13.1. I
suspect that this is likely physiologic. Trace amount of gas in the
urinary bladder, query recent catheterization.

Aortoiliac atherosclerotic vascular disease.

SKELETON

Degenerative activity along the glenohumeral joints. No current
findings of active osseous malignancy.
IMPRESSION: 1. Right subareolar mass measures 4.7 cm in long axis with maximum
SUV 23.6 compatible with malignancy. There are 2 hypermetabolic
mildly enlarged right axillary lymph nodes but no other metastatic
lesions are identified.
2. Other imaging findings of potential clinical significance: Aortic
Atherosclerosis ([id number]). Coronary atherosclerosis. Mild
cardiomegaly. Trace left pleural effusion. Intracranial chronic
ischemic microvascular white matter disease.

## 2017-01-10 MED ORDER — FLUDEOXYGLUCOSE F - 18 (FDG) INJECTION
7.1000 | Freq: Once | INTRAVENOUS | Status: AC | PRN
  Administered 2017-01-10: 7.1 via INTRAVENOUS

## 2017-01-14 ENCOUNTER — Non-Acute Institutional Stay (SKILLED_NURSING_FACILITY): Payer: Medicare Other | Admitting: Internal Medicine

## 2017-01-14 ENCOUNTER — Telehealth: Payer: Self-pay | Admitting: *Deleted

## 2017-01-14 ENCOUNTER — Encounter: Payer: Self-pay | Admitting: Internal Medicine

## 2017-01-14 DIAGNOSIS — R2681 Unsteadiness on feet: Secondary | ICD-10-CM

## 2017-01-14 DIAGNOSIS — M159 Polyosteoarthritis, unspecified: Secondary | ICD-10-CM | POA: Diagnosis not present

## 2017-01-14 DIAGNOSIS — K219 Gastro-esophageal reflux disease without esophagitis: Secondary | ICD-10-CM

## 2017-01-14 DIAGNOSIS — D519 Vitamin B12 deficiency anemia, unspecified: Secondary | ICD-10-CM | POA: Diagnosis not present

## 2017-01-14 DIAGNOSIS — I110 Hypertensive heart disease with heart failure: Secondary | ICD-10-CM

## 2017-01-14 DIAGNOSIS — I5043 Acute on chronic combined systolic (congestive) and diastolic (congestive) heart failure: Secondary | ICD-10-CM | POA: Diagnosis not present

## 2017-01-14 DIAGNOSIS — I10 Essential (primary) hypertension: Secondary | ICD-10-CM | POA: Diagnosis not present

## 2017-01-14 DIAGNOSIS — D631 Anemia in chronic kidney disease: Secondary | ICD-10-CM | POA: Diagnosis not present

## 2017-01-14 DIAGNOSIS — I5022 Chronic systolic (congestive) heart failure: Secondary | ICD-10-CM

## 2017-01-14 LAB — CBC AND DIFFERENTIAL
HEMATOCRIT: 28 — AB (ref 36–46)
HEMOGLOBIN: 9.3 — AB (ref 12.0–16.0)
Platelets: 197 (ref 150–399)
WBC: 6.5

## 2017-01-14 NOTE — Telephone Encounter (Signed)
Left message on daughter's vm to call back tomorrow for good report on her mom's PET scan.

## 2017-01-14 NOTE — Progress Notes (Signed)
Provider: [name] MD   Location:  [facility] Room Number: 6 Place of Service:  SNF (31)  PCP: [name], MD Patient Care Team: [name], MD as PCP - General (Internal Medicine) Juanita Craver, MD as Consulting Physician (Gastroenterology) Shon Hough, MD as Consulting Physician (Ophthalmology) Thornell Sartorius, MD as Consulting Physician (Otolaryngology) [place], Tulia Mast, Man X, NP as Nurse Practitioner (Nurse Practitioner) Bo Merino, MD as Consulting Physician (Rheumatology) Suella Broad, MD as Consulting Physician (Physical Medicine and Rehabilitation) Netta Cedars, MD as Consulting Physician (Orthopedic Surgery) Fanny Skates, MD as Consulting Physician (General Surgery) Truitt Merle, MD as Consulting Physician (Hematology) Thea Silversmith, MD (Inactive) as Consulting Physician (Radiation Oncology) Rockwell Germany, RN as Registered Nurse Mauro Kaufmann, RN as Registered Nurse Jake Shark, Johny Blamer, NP as Nurse Practitioner (Nurse Practitioner) [name], MD as Consulting Physician (Cardiology)  Extended Emergency Contact Information Primary Emergency Contact: [name],[name] Address: [address]          [place] [address] [place] Phone: [phone] Mobile Phone: [phone] Relation: Daughter Secondary Emergency Contact: Mort Sawyers [place] of [place] Phone: [phone] Relation: None  Code Status: DNR  Goals of Care: Advanced Directive information Advanced Directives 01/14/2017  Does Patient Have a Medical Advance Directive? Yes  Type of Advance Directive Out of facility DNR (pink MOST or yellow form)  Does patient want to make changes to medical advance directive? No - Patient declined  Copy of [place] in Chart? -  Pre-existing out of facility DNR order (yellow form or pink MOST form) Yellow form placed in chart  (order not valid for inpatient use);Pink MOST form placed in chart (order not valid for inpatient use)      Chief Complaint  Patient presents with  . Medical Management of Chronic Issues    Routine Visit     HPI: Patient is a 83 y.o. female seen today for routine visit. She is pleasantly confused and denies any concern this am. No health concern from nursing. She takes her medications. Ambulates with her wheelchair. No fall reported. Reviewed BP readings with several SBP 152-178. Reviewed OV note from oncology from 12/27/16 for malignant neoplasm of right breast. Plan is for mastectomy after cardiology clearance.   Past Medical History:  Diagnosis Date  . Arthritis    Osteoarthritis  . Cancer of left breast [facility]) August 2016  . Cerebral atherosclerosis 12/19/2012  . Cerebral atrophy 12/19/2012  . Cerebral ischemia 12/19/2012   Microvascular   . CKD (chronic kidney disease) stage 3, GFR 30-59 ml/min (HCC) 11/03/2014  . Cough   . Dementia   . Diverticulosis of colon (without mention of hemorrhage)   . Edema   . Flat feet   . GERD (gastroesophageal reflux disease)   . Glaucoma   . Heart murmur   . Hyperlipidemia   . Hypertension   . Hypertonicity of bladder   . Ischemic cardiomyopathy 11/04/2014  . Lumbago   . Memory change 03/28/2016  . Osteoarthrosis, unspecified whether generalized or localized, unspecified site   . Other malaise and fatigue   . Pain in joint, pelvic region and thigh   . Pain in joint, shoulder region   . Personal history of fall   . RLQ abdominal pain 01/17/2011  . Senile osteoporosis   . Small bowel obstruction ([facility])   . STEMI (ST elevation myocardial infarction) ([facility]) 11/04/2014  . Unstable gait 11/04/2014  . Urine, incontinence,  stress female 09/18/2014   Past Surgical History:  Procedure Laterality Date  . BREAST LUMPECTOMY Bilateral   . BREAST LUMPECTOMY WITH RADIOACTIVE SEED LOCALIZATION Left 03/15/2015   Procedure: LEFT BREAST LUMPECTOMY WITH  RADIOACTIVE SEED LOCALIZATION;  Surgeon: Fanny Skates, MD;  Location: [facility];  Service: General;  Laterality: Left;  . CARDIAC CATHETERIZATION N/A 11/08/2014   Procedure: Left Heart Cath and Coronary Angiography;  Surgeon: [name], MD;  Location: [facility] CV [facility];  Service: Cardiovascular;  Laterality: N/A;  . COLON SURGERY  02/1996   Colectomy, partial for diverticular bleed 90%  . EYE SURGERY    . JOINT REPLACEMENT Left 12/2000   Left knee; Dr. Wynelle Link  . LAPAROSCOPIC LYSIS OF ADHESIONS  11/15/10   Exploratory  . TONSILLECTOMY      reports that she quit smoking about 36 years ago. Her smoking use included cigarettes. She has a 15.00 pack-year smoking history. she has never used smokeless tobacco. She reports that she drinks about 0.6 oz of alcohol per week. She reports that she does not use drugs. Social History   Socioeconomic History  . Marital status: Widowed    Spouse name: Not on file  . Number of children: Not on file  . Years of education: Not on file  . Highest education level: Not on file  Social Needs  . Financial resource strain: Not on file  . Food insecurity - worry: Not on file  . Food insecurity - inability: Not on file  . Transportation needs - medical: Not on file  . Transportation needs - non-medical: Not on file  Occupational History  . Not on file  Tobacco Use  . Smoking status: Former Smoker    Packs/day: 0.50    Years: 30.00    Pack years: 15.00    Types: Cigarettes    Last attempt to quit: 12/16/1980    Years since quitting: 36.1  . Smokeless tobacco: Never Used  Substance and Sexual Activity  . Alcohol use: Yes    Alcohol/week: 0.6 oz    Types: 1 Glasses of wine per week    Comment: occasional glass of wine  . Drug use: No  . Sexual activity: No  Other Topics Concern  . Not on file  Social History Narrative   Lives at [facility] 05/2011   Widowed 2003   Living Will   Walks with cane   Never smoked    Exercise none      Functional Status Survey:    Family History  Problem Relation Age of Onset  . Cancer Father 10       colon  . Cancer Maternal Aunt        breast cancer   . Cancer Cousin        breast cancer     Health Maintenance  Topic Date Due  . MAMMOGRAM  11/26/2017  . TETANUS/TDAP  04/02/2026  . INFLUENZA VACCINE  Completed  . DEXA SCAN  Completed  . PNA vac Low Risk Adult  Completed    Allergies  Allergen Reactions  . Sulfa Antibiotics Itching    Outpatient Encounter Medications as of 01/14/2017  Medication Sig  . acetaminophen (TYLENOL) 325 MG tablet Take 650 mg by mouth 2 (two) times daily. Take 2 tablets every 6 hours as needed for mild pain or fever  . alum & mag hydroxide-simeth (MAALOX/MYLANTA) 200-200-20 MG/5ML suspension Take 30 mLs by mouth every 4 (four) hours as needed for indigestion.   Marland Kitchen atorvastatin (  LIPITOR) 10 MG tablet Take 10 mg by mouth daily.  Marland Kitchen BETIMOL 0.5 % ophthalmic solution Place 1 drop daily into both eyes.   . carvedilol (COREG) 12.5 MG tablet Take 12.5 mg by mouth 2 (two) times daily with a meal.  . cloNIDine (CATAPRES) 0.1 MG tablet Take 0.1 mg by mouth 2 (two) times daily.  . Coenzyme Q10 50 MG TABS Take one tablet by mouth at bedtime  . famotidine (PEPCID) 20 MG tablet Take 20 mg by mouth at bedtime.   . folic acid (FOLVITE) 1 MG tablet Take 1 mg by mouth daily.  . Hypromellose (ARTIFICIAL TEARS OP) Place 2 drops 2 (two) times daily into both eyes.   . isosorbide mononitrate (IMDUR) 60 MG 24 hr tablet Take 60 mg by mouth at bedtime.   Marland Kitchen KLOR-CON 10 10 MEQ tablet Take 10 mEq by mouth daily.  Marland Kitchen latanoprost (XALATAN) 0.005 % ophthalmic solution Place 1 drop into both eyes at bedtime.   . torsemide (DEMADEX) 10 MG tablet Take 10 mg daily by mouth. Hold for SBP < 110  . vitamin B-12 (CYANOCOBALAMIN) 1000 MCG tablet Take 1,000 mcg See admin instructions by mouth. Take 1000 mcg by mouth daily On Mondays,Wednesdays and Fridays   No facility-administered  encounter medications on file as of 01/14/2017.     Review of Systems  Constitutional: Negative for appetite change, fatigue and fever.  HENT: Positive for hearing loss. Negative for congestion, mouth sores, rhinorrhea and trouble swallowing.   Eyes: Positive for visual disturbance. Negative for pain and itching.  Respiratory: Negative for cough and shortness of breath.   Cardiovascular: Negative for chest pain and palpitations.  Gastrointestinal: Negative for abdominal pain, constipation, diarrhea, nausea and vomiting.  Genitourinary: Negative for dysuria.       Has urinary incontinence  Musculoskeletal: Positive for arthralgias and gait problem. Negative for back pain.  Skin: Negative for rash.  Neurological: Negative for dizziness and headaches.  Psychiatric/Behavioral: Positive for confusion.    Vitals:   01/14/17 1205  BP: 136/76  Pulse: 78  Resp: 18  Temp: 97.6 F (36.4 C)  TempSrc: Oral  Weight: 146 lb 6.4 oz (66.4 kg)  Height: 5\' 6"  (1.676 m)   Body mass index is 23.63 kg/m.   Wt Readings from Last 3 Encounters:  01/14/17 146 lb 6.4 oz (66.4 kg)  12/27/16 144 lb 4.8 oz (65.5 kg)  12/06/16 144 lb 9.6 oz (65.6 kg)   Physical Exam  Constitutional: She appears well-developed and well-nourished. No distress.  HENT:  Head: Normocephalic and atraumatic.  Mouth/Throat: Oropharynx is clear and moist.  Eyes: Conjunctivae and EOM are normal. Right eye exhibits no discharge. Left eye exhibits no discharge.  Neck: Neck supple.  Cardiovascular: Normal rate and regular rhythm.  Murmur heard. Pulmonary/Chest: Effort normal and breath sounds normal.  Palpable lump to right breast  Abdominal: Bowel sounds are normal. There is no tenderness.  Musculoskeletal: She exhibits no edema or tenderness.  Lymphadenopathy:    She has no cervical adenopathy.  Neurological: She is alert.  Oriented to place and time but not to person this visit.   Skin: Skin is warm and dry. She is not  diaphoretic.  Psychiatric: She has a normal mood and affect.    Labs reviewed: Basic Metabolic Panel: Recent Labs    08/24/16 1053 08/25/16 0105 08/26/16 0427  09/10/16 12/10/16 12/26/16  NA 140 138 138   < > 140 138 139  K 3.8 3.4* 4.0   < >  4.1 4.2 4.2  CL 109 106 105  --   --   --   --   CO2 21* 22 21*  --   --   --   --   GLUCOSE 127* 161* 137*  --   --   --   --   BUN 35* 41* 64*   < > 33* 37* 34*  CREATININE 2.04* 2.20* 2.90*   < > 2.7* 2.4* 2.6*  CALCIUM 8.8* 8.5* 8.2*  --   --   --   --   MG  --  1.9  --   --   --   --   --    < > = values in this interval not displayed.   Liver Function Tests: Recent Labs    07/30/16 08/24/16 1053 12/10/16  AST 15  14 17  12*  ALT 19  6* 9* 5*  ALKPHOS 33  36 42 37  BILITOT  --  0.9  --   PROT  --  7.1  --   ALBUMIN  --  3.8  --    No results for input(s): LIPASE, AMYLASE in the last 8760 hours. No results for input(s): AMMONIA in the last 8760 hours. CBC: Recent Labs    08/24/16 1053 08/25/16 0105 08/26/16 0427  10/17/16 12/10/16 12/26/16  WBC 10.8* 19.8* 12.9*   < > 5.1 6.1 7.8  NEUTROABS 8.2*  --   --   --   --   --   --   HGB 12.2 11.1* 10.0*   < > 8.8* 9.7* 11.1*  HCT 36.3 33.0* 30.6*   < > 27* 29* 33*  MCV 89.9 88.2 86.4  --   --   --   --   PLT 186 190 197   < > 186 207 228   < > = values in this interval not displayed.   Cardiac Enzymes: Recent Labs    08/24/16 1444 08/24/16 2023 08/25/16 0105  TROPONINI 0.46* 0.31* 0.22*   BNP: Invalid input(s): POCBNP Lab Results  Component Value Date   HGBA1C 5.6 11/06/2014   Lab Results  Component Value Date   TSH 2.21 04/04/2016   Lab Results  Component Value Date   VITAMINB12 1,623 12/10/2016   No results found for: FOLATE Lab Results  Component Value Date   IRON 36 11/10/2014   TIBC 249 (L) 11/10/2014   FERRITIN 65 11/10/2014    Lipid Panel: Recent Labs    05/28/16 10/29/16 11/28/16  CHOL 126 102 108  HDL 44 47 50  LDLCALC 63 63 41  TRIG  102 102 85   Lab Results  Component Value Date   HGBA1C 5.6 11/06/2014    Procedures since last visit: Nm Pet Image Initial (pi) Skull Base To Thigh  Result Date: 01/10/2017 CLINICAL DATA:  Initial treatment strategy for right breast cancer. History of left breast cancer in 2012. EXAM: NUCLEAR MEDICINE PET SKULL BASE TO THIGH TECHNIQUE: 7.1 mCi F-18 FDG was injected intravenously. Full-ring PET imaging was performed from the skull base to thigh after the radiotracer. CT data was obtained and used for attenuation correction and anatomic localization. FASTING BLOOD GLUCOSE:  Value: 92 mg/dl COMPARISON:  CT abdomen 11/15/2010 FINDINGS: NECK No hypermetabolic lymph nodes in the neck. Intracranial chronic ischemic microvascular white matter disease. Chronic left maxillary sinusitis. CHEST 4.7 by 4.5 cm right subareolar mass with maximum SUV 23.6. There are 2 hypermetabolic right axillary lymph nodes. The more posterior node measures  1.2 cm in short axis on image 59/4 with a maximum SUV of 11.5. The more anterior lymph node measures 1.2 cm in short axis on image 61/4 with maximum SUV 9.2. Trace left pleural effusion. Coronary, aortic arch, and branch vessel atherosclerotic vascular disease. Mild cardiomegaly. Mild biapical pleuroparenchymal scarring. ABDOMEN/PELVIS No abnormal hypermetabolic activity within the liver, pancreas, adrenal glands, or spleen. No hypermetabolic lymph nodes in the abdomen or pelvis. Physiologic activity in bowel, with focally accentuated activity near the sigmoid anastomotic site where the maximum SUV is 13.1. I suspect that this is likely physiologic. Trace amount of gas in the urinary bladder, query recent catheterization. Aortoiliac atherosclerotic vascular disease. SKELETON Degenerative activity along the glenohumeral joints. No current findings of active osseous malignancy. IMPRESSION: 1. Right subareolar mass measures 4.7 cm in long axis with maximum SUV 23.6 compatible with  malignancy. There are 2 hypermetabolic mildly enlarged right axillary lymph nodes but no other metastatic lesions are identified. 2. Other imaging findings of potential clinical significance: Aortic Atherosclerosis (ICD10-I70.0). Coronary atherosclerosis. Mild cardiomegaly. Trace left pleural effusion. Intracranial chronic ischemic microvascular white matter disease. Electronically Signed   By: Van Clines M.D.   On: 01/10/2017 12:02    Assessment/Plan  OA Continue tylenol 650 mg bid, assistance with transfers. Wheelchair for ambulation.  Hypertensive heart disease with heart failure and ckd Few elevated SBP readings. Currently on imdur 60 mg daily and coreg 12.5 mg bid with torsemide 10 mg daily. Also on clonidine 0.1 mg bid. Change imdur to 90 mg daily and monitor.   gerd Denies any symptom, continue famotidine 20 mg daily and monitor.   Unsteady gait Wheelchair for ambulation, fall precautions.   Labs/tests ordered:  None  Communication: reviewed care plan with patient and charge nurse.     [name], MD Internal Medicine [facility] [address] [place], [place] [address] Cell Phone (Monday-Friday 8 am - 5 pm): [phone] On Call: [phone] and follow prompts after 5 pm and on weekends Office Phone: [phone] Office Fax: [phone]

## 2017-01-14 NOTE — Telephone Encounter (Signed)
-----   Message from Truitt Merle, MD sent at 01/12/2017  8:36 PM EST ----- Janifer Adie or Thu, please call pt's daughter and let her know the PET scan result, no distant metastasis.   I am copying Dr. Dalbert Batman here.   Thanks   Truitt Merle  01/12/2017

## 2017-01-15 ENCOUNTER — Other Ambulatory Visit: Payer: Self-pay | Admitting: *Deleted

## 2017-01-17 NOTE — Telephone Encounter (Signed)
Pt's daughter called back & was given information regarding no distant metastasis per Dr Ernestina Penna message. Daughter states that she is pleased & understands message.

## 2017-01-17 NOTE — Telephone Encounter (Signed)
-----   Message from Truitt Merle, MD sent at 01/12/2017  8:36 PM EST ----- Janifer Adie or Thu, please call pt's daughter and let her know the PET scan result, no distant metastasis.   I am copying Dr. Dalbert Batman here.   Thanks   Truitt Merle  01/12/2017

## 2017-01-30 ENCOUNTER — Telehealth: Payer: Self-pay | Admitting: Hematology

## 2017-01-30 NOTE — Telephone Encounter (Signed)
Patient doesn't drive so rescheduled

## 2017-01-31 ENCOUNTER — Non-Acute Institutional Stay (SKILLED_NURSING_FACILITY): Payer: Medicare Other | Admitting: Internal Medicine

## 2017-01-31 ENCOUNTER — Ambulatory Visit: Payer: Medicare Other | Admitting: Hematology

## 2017-01-31 ENCOUNTER — Encounter: Payer: Self-pay | Admitting: Internal Medicine

## 2017-01-31 DIAGNOSIS — Z0000 Encounter for general adult medical examination without abnormal findings: Secondary | ICD-10-CM | POA: Diagnosis not present

## 2017-01-31 NOTE — Progress Notes (Addendum)
Subjective:   [name] is a 83 y.o. female who presents for an Initial Medicare Annual Wellness Visit. She resides in SNF at [facility]      Cardiac Risk Factors include: dyslipidemia;advanced age (>65men, >21 women);hypertension;sedentary lifestyle     Objective:    Today's Vitals   01/31/17 0940  BP: (!) 146/72  Pulse: 72  Resp: 18  Temp: (!) 96.6 F (35.9 C)  TempSrc: Oral  Weight: 148 lb 8 oz (67.4 kg)  Height: 5\' 6"  (1.676 m)   Body mass index is 23.97 kg/m.  Advanced Directives 01/31/2017 01/14/2017 12/06/2016 11/29/2016 11/21/2016 11/08/2016 10/17/2016  Does Patient Have a Medical Advance Directive? Yes Yes Yes Yes Yes Yes Yes  Type of Advance Directive Out of facility DNR (pink MOST or yellow form) Out of facility DNR (pink MOST or yellow form) Out of facility DNR (pink MOST or yellow form) Out of facility DNR (pink MOST or yellow form) Out of facility DNR (pink MOST or yellow form) Out of facility DNR (pink MOST or yellow form) Out of facility DNR (pink MOST or yellow form);Living will  Does patient want to make changes to medical advance directive? No - Patient declined No - Patient declined No - Patient declined No - Patient declined No - Patient declined No - Patient declined No - Patient declined  Copy of [place] in Chart? - - Yes - - - -  Pre-existing out of facility DNR order (yellow form or pink MOST form) Yellow form placed in chart (order not valid for inpatient use);Pink MOST form placed in chart (order not valid for inpatient use) Yellow form placed in chart (order not valid for inpatient use);Pink MOST form placed in chart (order not valid for inpatient use) Yellow form placed in chart (order not valid for inpatient use);Pink MOST form placed in chart (order not valid for inpatient use) Yellow form placed in chart (order not valid for inpatient use);Pink MOST form placed in chart (order not valid for inpatient use) Yellow form placed in chart  (order not valid for inpatient use);Pink MOST form placed in chart (order not valid for inpatient use) Yellow form placed in chart (order not valid for inpatient use) Yellow form placed in chart (order not valid for inpatient use)    Current Medications (verified) Outpatient Encounter Medications as of 01/31/2017  Medication Sig  . acetaminophen (TYLENOL) 325 MG tablet Take 650 mg by mouth 2 (two) times daily. Take 2 tablets every 6 hours as needed for mild pain or fever  . alum & mag hydroxide-simeth (MAALOX/MYLANTA) 200-200-20 MG/5ML suspension Take 30 mLs by mouth every 4 (four) hours as needed for indigestion.   Marland Kitchen atorvastatin (LIPITOR) 10 MG tablet Take 10 mg by mouth daily.  Marland Kitchen BETIMOL 0.5 % ophthalmic solution Place 1 drop daily into both eyes.   . carvedilol (COREG) 12.5 MG tablet Take 12.5 mg by mouth 2 (two) times daily with a meal.  . cloNIDine (CATAPRES) 0.1 MG tablet Take 0.1 mg by mouth 2 (two) times daily.  . Coenzyme Q10 50 MG TABS Take one tablet by mouth at bedtime  . famotidine (PEPCID) 20 MG tablet Take 20 mg by mouth at bedtime.   . folic acid (FOLVITE) 1 MG tablet Take 1 mg by mouth daily.  . Hypromellose (ARTIFICIAL TEARS OP) Place 2 drops 2 (two) times daily into both eyes.   . isosorbide mononitrate (IMDUR) 60 MG 24 hr tablet Take 90 mg by mouth at  bedtime.   Marland Kitchen KLOR-CON 10 10 MEQ tablet Take 10 mEq by mouth daily.  Marland Kitchen latanoprost (XALATAN) 0.005 % ophthalmic solution Place 1 drop into both eyes at bedtime.   . torsemide (DEMADEX) 10 MG tablet Take 10 mg daily by mouth. Hold for SBP < 110  . vitamin B-12 (CYANOCOBALAMIN) 1000 MCG tablet Take 1,000 mcg See admin instructions by mouth. Take 1000 mcg by mouth daily On Mondays,Wednesdays and Fridays   No facility-administered encounter medications on file as of 01/31/2017.     Allergies (verified) Sulfa antibiotics   History: Past Medical History:  Diagnosis Date  . Arthritis    Osteoarthritis  . Cancer of left  breast [facility]) August 2016  . Cerebral atherosclerosis 12/19/2012  . Cerebral atrophy 12/19/2012  . Cerebral ischemia 12/19/2012   Microvascular   . CKD (chronic kidney disease) stage 3, GFR 30-59 ml/min (HCC) 11/03/2014  . Cough   . Dementia   . Diverticulosis of colon (without mention of hemorrhage)   . Edema   . Flat feet   . GERD (gastroesophageal reflux disease)   . Glaucoma   . Heart murmur   . Hyperlipidemia   . Hypertension   . Hypertonicity of bladder   . Ischemic cardiomyopathy 11/04/2014  . Lumbago   . Memory change 03/28/2016  . Osteoarthrosis, unspecified whether generalized or localized, unspecified site   . Other malaise and fatigue   . Pain in joint, pelvic region and thigh   . Pain in joint, shoulder region   . Personal history of fall   . RLQ abdominal pain 01/17/2011  . Senile osteoporosis   . Small bowel obstruction ([place])   . STEMI (ST elevation myocardial infarction) ([place]) 11/04/2014  . Unstable gait 11/04/2014  . Urine, incontinence, stress female 09/18/2014   Past Surgical History:  Procedure Laterality Date  . BREAST LUMPECTOMY Bilateral   . BREAST LUMPECTOMY WITH RADIOACTIVE SEED LOCALIZATION Left 03/15/2015   Procedure: LEFT BREAST LUMPECTOMY WITH RADIOACTIVE SEED LOCALIZATION;  Surgeon: Fanny Skates, MD;  Location: [place];  Service: General;  Laterality: Left;  . CARDIAC CATHETERIZATION N/A 11/08/2014   Procedure: Left Heart Cath and Coronary Angiography;  Surgeon: [name], MD;  Location: [place] [facility];  Service: Cardiovascular;  Laterality: N/A;  . COLON SURGERY  02/1996   Colectomy, partial for diverticular bleed 90%  . EYE SURGERY    . JOINT REPLACEMENT Left 12/2000   Left knee; Dr. Wynelle Link  . LAPAROSCOPIC LYSIS OF ADHESIONS  11/15/10   Exploratory  . TONSILLECTOMY     Family History  Problem Relation Age of Onset  . Cancer Father 62       colon  . Cancer Maternal Aunt        breast cancer   . Cancer Cousin        breast cancer     Social History   Socioeconomic History  . Marital status: Widowed    Spouse name: None  . Number of children: None  . Years of education: None  . Highest education level: None  Social Needs  . Financial resource strain: Patient refused  . Food insecurity - worry: Patient refused  . Food insecurity - inability: Patient refused  . Transportation needs - medical: Patient refused  . Transportation needs - non-medical: Patient refused  Occupational History  . None  Tobacco Use  . Smoking status: Former Smoker    Packs/day: 0.50    Years: 30.00    Pack years: 15.00  Types: Cigarettes    Last attempt to quit: 12/16/1980    Years since quitting: 36.1  . Smokeless tobacco: Never Used  Substance and Sexual Activity  . Alcohol use: Yes    Alcohol/week: 0.6 oz    Types: 1 Glasses of wine per week    Comment: occasional glass of wine  . Drug use: No  . Sexual activity: No  Other Topics Concern  . None  Social History Narrative   Lives at [facility] 05/2011   Widowed 2003   Living Will   Walks with cane   Never smoked    Exercise none     Tobacco Counseling Counseling given: Not Answered   Clinical Intake:  Pre-visit preparation completed: No  Pain : No/denies pain     BMI - recorded: 23.97 Nutritional Status: BMI of 19-24  Normal Nutritional Risks: None Diabetes: No  How often do you need to have someone help you when you read instructions, pamphlets, or other written materials from your doctor or pharmacy?: 1 - Never  Interpreter Needed?: No  Information entered by :: Shahida Schnackenberg   Activities of Daily Living In your present state of health, do you have any difficulty performing the following activities: 01/31/2017 08/24/2016  Hearing? Y N  Vision? Y N  Difficulty concentrating or making decisions? Tempie Donning  Walking or climbing stairs? Y Y  Dressing or bathing? Y N  Doing errands, shopping? Tempie Donning  Preparing Food and eating ? Y -  Using the  Toilet? Y -  In the past six months, have you accidently leaked urine? Y -  Do you have problems with loss of bowel control? Y -  Managing your Medications? Y -  Managing your Finances? Y -  Housekeeping or managing your Housekeeping? Y -  Some recent data might be hidden     Immunizations and Health Maintenance Immunization History  Administered Date(s) Administered  . Influenza Whole 11/19/2011, 12/02/2012  . Influenza-Unspecified 12/06/2013, 12/06/2015, 12/02/2016  . PPD Test 11/10/2014  . Pneumococcal Conjugate-13 03/09/2009  . Pneumococcal Polysaccharide-23 02/18/2009  . Tdap 04/02/2016   There are no preventive care reminders to display for this patient.  Patient Care Team: [name], MD as PCP - General (Internal Medicine) Juanita Craver, MD as Consulting Physician (Gastroenterology) Shon Hough, MD as Consulting Physician (Ophthalmology) Thornell Sartorius, MD as Consulting Physician (Otolaryngology) [place], Man X, NP as Nurse Practitioner (Nurse Practitioner) Bo Merino, MD as Consulting Physician (Rheumatology) Suella Broad, MD as Consulting Physician (Physical Medicine and Rehabilitation) Netta Cedars, MD as Consulting Physician (Orthopedic Surgery) Fanny Skates, MD as Consulting Physician (General Surgery) Truitt Merle, MD as Consulting Physician (Hematology) Thea Silversmith, MD (Inactive) as Consulting Physician (Radiation Oncology) Rockwell Germany, RN as Registered Nurse Mauro Kaufmann, RN as Registered Nurse Jake Shark, Johny Blamer, NP as Nurse Practitioner (Nurse Practitioner) [name], MD as Consulting Physician (Cardiology)  Indicate any recent [place] you may have received from other than [facility] providers in the past year (date may be approximate).     Assessment:   This is a routine wellness examination for [place].  Hearing/Vision screen Hearing Screening Comments: Hearing loss present  Vision  Screening Comments: Wears corrective glasses  Dietary issues and exercise activities discussed: Exercise limited by: neurologic condition(s);orthopedic condition(s)  Goals    None     Depression Screen PHQ 2/9 Scores 01/31/2017 11/17/2014 04/21/2014 12/17/2012  PHQ - 2 Score 0 0 0 0    Fall  Risk Fall Risk  01/31/2017 07/13/2015 11/17/2014 04/21/2014 12/17/2012  Falls in the past year? No No Yes Yes Yes  Number falls in past yr: - - 1 1 1   Injury with Fall? - - No No Yes  Comment - - - - nose lacteration  Risk for fall due to : History of fall(s);Impaired balance/gait;Impaired vision - Impaired balance/gait;History of fall(s) - -    Is the patient's home free of loose throw rugs in walkways, pet beds, electrical cords, etc?   yes      Grab bars in the bathroom? yes      Handrails on the stairs?   lives in skilled nursing facility, no stairs      Adequate lighting?   yes  Timed Get Up and Go Performed needs assistance with transfers  Cognitive Function: MMSE - Mini Mental State Exam 10/26/2015 04/21/2014  Orientation to time 4 4  Orientation to Place 5 5  Registration 3 3  Attention/ Calculation 5 5  Recall 3 2  Language- name 2 objects 2 2  Language- repeat 1 1  Language- follow 3 step command 3 3  Language- read & follow direction 1 1  Write a sentence 1 1  Copy design 1 1  Total score 29 28     6CIT Screen 01/31/2017  What Year? 0 points  What month? 0 points  What time? 0 points  Count back from 20 4 points  Months in reverse 4 points  Repeat phrase 10 points  Total Score 18    Screening Tests Health Maintenance  Topic Date Due  . MAMMOGRAM  11/26/2017  . TETANUS/TDAP  04/02/2026  . INFLUENZA VACCINE  Completed  . DEXA SCAN  Completed  . PNA vac Low Risk Adult  Completed    Qualifies for Shingles Vaccine? Yes, facility to provide this in 2019  Cancer Screenings: Lung: Low Dose CT Chest recommended if Age 22-80 years, 30 pack-year currently smoking OR have  quit w/in 15years. Patient does not qualify. Breast: Up to date on Mammogram? Yes   Up to date of Bone Density/Dexa? Yes Colorectal: not applicable  Additional Screenings: Hepatitis B/HIV/Syphillis: N/A Hepatitis C Screening: N/A     Plan:     I have personally reviewed and noted the following in the patient's chart:   . Medical and social history . Use of alcohol, tobacco or illicit drugs  . Current medications and supplements . Functional ability and status . Nutritional status . Physical activity . Advanced directives . List of other physicians . Hospitalizations, surgeries, and ER visits in previous 12 months . Vitals . Screenings to include cognitive, depression, and falls . Referrals and appointments  In addition, I have reviewed and discussed with patient certain preventive protocols, quality metrics, and best practice recommendations. A written personalized care plan for preventive services as well as general preventive health recommendations were provided to patient.     [name], MD   01/31/2017

## 2017-01-31 NOTE — Progress Notes (Signed)
[facility]  Telephone:([phone]) [phone] [phone]) [phone]  Clinic Follow Up Note   Patient Care Team: [name], MD as PCP - General (Internal Medicine) Juanita Craver, MD as Consulting Physician (Gastroenterology) Shon Hough, MD as Consulting Physician (Ophthalmology) Thornell Sartorius, MD as Consulting Physician (Otolaryngology) [place], Man X, NP as Nurse Practitioner (Nurse Practitioner) Bo Merino, MD as Consulting Physician (Rheumatology) Suella Broad, MD as Consulting Physician (Physical Medicine and Rehabilitation) Netta Cedars, MD as Consulting Physician (Orthopedic Surgery) Fanny Skates, MD as Consulting Physician (General Surgery) Truitt Merle, MD as Consulting Physician (Hematology) Thea Silversmith, MD (Inactive) as Consulting Physician (Radiation Oncology) Rockwell Germany, RN as Registered Nurse Mauro Kaufmann, RN as Registered Nurse Jake Shark, Johny Blamer, NP as Nurse Practitioner (Nurse Practitioner) [name], MD as Consulting Physician (Cardiology)    Date of Service:  02/03/2017  CHIEF COMPLAINTS/PURPOSE OF CONSULTATION:  Follow up untreated right breast cancer, triple negative   Oncology History   Breast cancer of upper-outer quadrant of left female breast   Staging form: Breast, AJCC 7th Edition     Clinical: Stage 0 (Tis (DCIS), N0, M0) - Unsigned       Breast cancer of upper-outer quadrant of left female breast ([place])   09/23/2014 Mammogram    Left breast: new cluster of calcifications in the left breast is indeterminate. Spot magnification views are recommended      10/13/2014 Initial Biopsy    Left breast biopsy: DCIS, high-grade, with calcification and necrosis. ER- (0%), PR- (0%)      10/13/2014 Clinical Stage    Stage 0: Tis N0      03/15/2015 Definitive Surgery    Left lumpectomy: DCIS, high grade, with comedonecrosis and calcifications, spanning 1.5 cm, ALH, ER- (0%), PR- (0%).      03/15/2015 Pathologic Stage    Stage 0: Tis N0  Diagnosis 03/14/16 Breast, lumpectomy, Left - HIGH GRADE DUCTAL CARCINOMA IN SITU WITH COMEDONECROSIS AND CALCIFICATIONS, SPANNING APPROXIMATELY 1.5 CM IN GREATEST DIMENSION. - ATYPICAL LOBULAR HYPERPLASIA. - DUCTAL CARCINOMA IN SITU IS LESS THAN 0.2 CM IN SEVERAL AREAS TO THE ANTERIOR MARGIN. 1 of 3      03/29/2015 Survivorship    Survivorship care plan completed and mailed to patient in lieu of in person visit       Cancer of central portion of right female breast ([place])   11/26/2016 Mammogram    Diagnostic Mammogram and [place] 11/26/16  IMPRESSION:  The is a 4.2 cm lobulated mass in the right breast central to the nipple anterior depth is highly suggestive of malignancy. The lymph node in the right axilla is at a moderate concern but not classic for malignancy. An ultrasound guided biopsy is recommended.         11/26/2016 Initial Biopsy    Diagnosis 11/26/16 1. Breast, right, needle core biopsy, 12:00 o'clock - INVASIVE DUCTAL CARCINOMA - SEE COMMENT 2. Lymph node, needle/core biopsy, right axilla - METASTATIC CARCINOMA INVOLVING ONE LYMPH NODE       11/26/2016 Receptors her2    Estrogen Receptor: 0%, NEGATIVE Progesterone Receptor: 0%, NEGATIVE Proliferation Marker Ki67: 20% HER2 - NEGATIVE      11/29/2016 Initial Diagnosis    Infiltrating ductal carcinoma of right breast ([place])      01/10/2017 PET scan    PET 01/10/17  IMPRESSION: 1. Right subareolar mass measures 4.7 cm in long axis with maximum SUV 23.6 compatible with malignancy. There are 2 hypermetabolic mildly enlarged right axillary lymph nodes  but no other metastatic lesions are identified. 2. Other imaging findings of potential clinical significance: Aortic Atherosclerosis (ICD10-I70.0). Coronary atherosclerosis. Mild cardiomegaly. Trace left pleural effusion. Intracranial chronic ischemic microvascular white matter disease.       HISTORY OF PRESENTING  ILLNESS: 03/14/16 [name] 83 y.o. female with multiple medical comorbidities, as listed below, is here because of recently diagnosed left breast DCIS. She presents to our multidisciplinary breast clinic today with her daughter.  This was discovered by screening mammogram. She did not have palpable breast mass, she denies any new symptoms lately. She had a stroke earlier this year, which resulted mild left-sided weakness. She is a resident in an assisted living, she is able to walk with a walker, and do some limited self care, but she is not active, spends most time on sitting during the day. Her daughter states she skips meals sometime, and does not go out often.   CURRENT THERAPY: PENDING Radiation  INTERVAL HISTORY:  [name] is here for a follow up of her triple negative right breast cancer. She presents to the clinic today accompanied by her daughter. She denies having any pain. Her daughter notes she does not think she is cleared for surgery based on her cardiologist. She has not seen Dr. Dalbert Batman yet. Her daughter is worried about her dementia. She can feed herself fine but she is full scale care otherwise. She lives at [facility] in a room alone on the nursing floor according to her daughter. Her daughter notes the patient's maternal aunt had breast cancer. She has not biological children, her daughter is adopted.       MEDICAL HISTORY:  Past Medical History:  Diagnosis Date  . Arthritis    Osteoarthritis  . Cancer of left breast [facility]) August 2016  . Cerebral atherosclerosis 12/19/2012  . Cerebral atrophy 12/19/2012  . Cerebral ischemia 12/19/2012   Microvascular   . CKD (chronic kidney disease) stage 3, GFR 30-59 ml/min (HCC) 11/03/2014  . Cough   . Dementia   . Diverticulosis of colon (without mention of hemorrhage)   . Edema   . Flat feet   . GERD (gastroesophageal reflux disease)   . Glaucoma   . Heart murmur   . Hyperlipidemia   . Hypertension   .  Hypertonicity of bladder   . Ischemic cardiomyopathy 11/04/2014  . Lumbago   . Memory change 03/28/2016  . Osteoarthrosis, unspecified whether generalized or localized, unspecified site   . Other malaise and fatigue   . Pain in joint, pelvic region and thigh   . Pain in joint, shoulder region   . Personal history of fall   . RLQ abdominal pain 01/17/2011  . Senile osteoporosis   . Small bowel obstruction ([place])   . STEMI (ST elevation myocardial infarction) ([place]) 11/04/2014  . Unstable gait 11/04/2014  . Urine, incontinence, stress female 09/18/2014    SURGICAL HISTORY: Past Surgical History:  Procedure Laterality Date  . BREAST LUMPECTOMY Bilateral   . BREAST LUMPECTOMY WITH RADIOACTIVE SEED LOCALIZATION Left 03/15/2015   Procedure: LEFT BREAST LUMPECTOMY WITH RADIOACTIVE SEED LOCALIZATION;  Surgeon: Fanny Skates, MD;  Location: [facility];  Service: General;  Laterality: Left;  . CARDIAC CATHETERIZATION N/A 11/08/2014   Procedure: Left Heart Cath and Coronary Angiography;  Surgeon: [name], MD;  Location: [facility];  Service: Cardiovascular;  Laterality: N/A;  . COLON SURGERY  02/1996   Colectomy, partial for diverticular bleed 90%  . EYE SURGERY    .  JOINT REPLACEMENT Left 12/2000   Left knee; Dr. Wynelle Link  . LAPAROSCOPIC LYSIS OF ADHESIONS  11/15/10   Exploratory  . TONSILLECTOMY     GYN HISTORY  Menarchal: 14 LMP: 52 Contraceptive: HRT: no  G0P0: adopted daughter   SOCIAL HISTORY: Social History   Socioeconomic History  . Marital status: Widowed    Spouse name: Not on file  . Number of children: Not on file  . Years of education: Not on file  . Highest education level: Not on file  Social Needs  . Financial resource strain: Patient refused  . Food insecurity - worry: Patient refused  . Food insecurity - inability: Patient refused  . Transportation needs - medical: Patient refused  . Transportation needs - non-medical: Patient refused  Occupational  History  . Not on file  Tobacco Use  . Smoking status: Former Smoker    Packs/day: 0.50    Years: 30.00    Pack years: 15.00    Types: Cigarettes    Last attempt to quit: 12/16/1980    Years since quitting: 36.1  . Smokeless tobacco: Never Used  Substance and Sexual Activity  . Alcohol use: Yes    Alcohol/week: 0.6 oz    Types: 1 Glasses of wine per week    Comment: occasional glass of wine  . Drug use: No  . Sexual activity: No  Other Topics Concern  . Not on file  Social History Narrative   Lives at [facility] 05/2011   Widowed 2003   Living Will   [place] with cane   Never smoked    Exercise none     FAMILY HISTORY: Family History  Problem Relation Age of Onset  . Cancer Father 62       colon  . Cancer Maternal Aunt        breast cancer   . Cancer Cousin        breast cancer     ALLERGIES:  is allergic to sulfa antibiotics.  MEDICATIONS:  Current Outpatient Medications  Medication Sig Dispense Refill  . acetaminophen (TYLENOL) 325 MG tablet Take 650 mg by mouth 2 (two) times daily. Take 2 tablets every 6 hours as needed for mild pain or fever    . alum & mag hydroxide-simeth (MAALOX/MYLANTA) 200-200-20 MG/5ML suspension Take 30 mLs by mouth every 4 (four) hours as needed for indigestion.     Marland Kitchen atorvastatin (LIPITOR) 10 MG tablet Take 10 mg by mouth daily.    Marland Kitchen BETIMOL 0.5 % ophthalmic solution Place 1 drop daily into both eyes.     . carvedilol (COREG) 12.5 MG tablet Take 12.5 mg by mouth 2 (two) times daily with a meal.    . cloNIDine (CATAPRES) 0.1 MG tablet Take 0.1 mg by mouth 2 (two) times daily.    . Coenzyme Q10 50 MG TABS Take one tablet by mouth at bedtime    . famotidine (PEPCID) 20 MG tablet Take 20 mg by mouth at bedtime.     . folic acid (FOLVITE) 1 MG tablet Take 1 mg by mouth daily.    . Hypromellose (ARTIFICIAL TEARS OP) Place 2 drops 2 (two) times daily into both eyes.     . isosorbide mononitrate (IMDUR) 60 MG 24 hr tablet Take 90 mg  by mouth at bedtime.     Marland Kitchen KLOR-CON 10 10 MEQ tablet Take 10 mEq by mouth daily.    Marland Kitchen latanoprost (XALATAN) 0.005 % ophthalmic solution Place 1 drop into both eyes  at bedtime.     . torsemide (DEMADEX) 10 MG tablet Take 10 mg daily by mouth. Hold for SBP < 110    . vitamin B-12 (CYANOCOBALAMIN) 1000 MCG tablet Take 1,000 mcg See admin instructions by mouth. Take 1000 mcg by mouth daily On Mondays,Wednesdays and Fridays     No current facility-administered medications for this visit.     REVIEW OF SYSTEMS:   Constitutional: Denies fevers, chills or abnormal night sweats Eyes: Denies blurriness of vision, double vision or watery eyes Ears, nose, mouth, throat, and face: Denies mucositis or sore throat Respiratory: Denies cough, dyspnea or wheezes Cardiovascular: Denies palpitation, chest discomfort or lower extremity swelling Gastrointestinal:  Denies nausea, heartburn or change in bowel habits Skin: Denies abnormal skin rashes Lymphatics: Denies new lymphadenopathy or easy bruising Neurological:Denies numbness, tingling or new weaknesses MSK: (+) sore arms and sore legs (+) wheelchair bound  Breast: (+) palpable mass in right breast  Behavioral/Psych: Mood is stable, no new changes  All other systems were reviewed with the patient and are negative.   PHYSICAL EXAMINATION: ECOG PERFORMANCE STATUS: 3 - Symptomatic, >50% confined to bed  Vitals:   02/03/17 1137  BP: (!) 158/83  Pulse: 65  Resp: (!) 24  Temp: 98.5 F (36.9 C)  SpO2: 98%   Filed Weights   02/03/17 1137  Weight: 147 lb 8 oz (66.9 kg)    GENERAL:alert, no distress and comfortable  (+) she is wheelchair bound  SKIN: skin color, texture, turgor are normal, no rashes or significant lesions EYES: normal, conjunctiva are pink and non-injected, sclera clear OROPHARYNX:no exudate, no erythema and lips, buccal mucosa, and tongue normal  NECK: supple, thyroid normal size, non-tender, without nodularity LYMPH:  no  palpable lymphadenopathy in the cervical, axillary or inguinal LUNGS: clear to auscultation and percussion with normal breathing effort HEART: regular rate & rhythm and no murmurs and no lower extremity edema ABDOMEN:abdomen soft, non-tender and normal bowel sounds Musculoskeletal:no cyanosis of digits and no clubbing  PSYCH: alert & oriented x 3 with fluent speech NEURO: no focal motor/sensory deficits Breasts: Breast inspection showed them to be symmetrical with no nipple discharge. Small ecchymosis at the biopsy site in left breast. Palpation of the breasts and axilla revealed no obvious mass that I could appreciate. Breast: Exam done in wheelchair: She has a (previously 5x6 cm mass) now 7x6cm mass in the central portion of right breast with skin erythema, no open ulcers. She has a 3-4cm palpable lymph node in right axilla.  Left breast and axilla has no palpable mass or adenopathy.        LABORATORY DATA:  I have reviewed the data as listed CBC Latest Ref Rng & Units 01/14/2017 12/26/2016 12/10/2016  WBC - 6.5 7.8 6.1  Hemoglobin 12.0 - 16.0 9.3(A) 11.1(A) 9.7(A)  Hematocrit 36 - 46 28(A) 33(A) 29(A)  Platelets 150 - 399 197 228 207   CMP Latest Ref Rng & Units 12/26/2016 12/10/2016 09/10/2016  Glucose 65 - 99 mg/dL - - -  BUN 4 - 21 34(A) 37(A) 33(A)  Creatinine 0.5 - 1.1 2.6(A) 2.4(A) 2.7(A)  Sodium 137 - 147 139 138 140  Potassium 3.4 - 5.3 4.2 4.2 4.1  Chloride 101 - 111 mmol/L - - -  CO2 22 - 32 mmol/L - - -  Calcium 8.9 - 10.3 mg/dL - - -  Total Protein 6.5 - 8.1 g/dL - - -  Total Bilirubin 0.3 - 1.2 mg/dL - - -  Alkaline Phos 25 -  125 - 37 -  AST 13 - 35 - 12(A) -  ALT 7 - 35 - 5(A) -     PATHOLOGY REPORT:  Diagnosis 11/26/16 1. Breast, right, needle core biopsy, 12:00 o'clock - INVASIVE DUCTAL CARCINOMA - SEE COMMENT 2. Lymph node, needle/core biopsy, right axilla - METASTATIC CARCINOMA INVOLVING ONE LYMPH NODE Microscopic Comment 1. Based on the biopsy, the  carcinoma appears Nottingham grade 2 of 3 and measures 0.9 cm in greatest linear extent. Dr. Saralyn Pilar reviewed the case. Prognostic markers (ER/PR/ki-67/HER2-FISH) are pending and will be reported in an addendum. This case was called to Dr. Marcelo Baldy on November 27, 2016. 2. FLUORESCENCE IN-SITU HYBRIDIZATION Results: HER2 - NEGATIVE RATIO OF HER2/CEP17 SIGNALS 1.38 AVERAGE HER2 COPY NUMBER PER CELL 2.00 Reference Range: NEGATIVE HER2/CEP17 Ratio <2.0 and average HER2 copy number <4.0 EQUIVOCAL HER2/CEP17 Ratio <2.0 and average HER2 copy number >=4.0 and <6.0 POSITIVE HER2/CEP17 Ratio >=2.0 or <2.0 and average HER2 copy number >=6.0 Vicente Males MD Pathologist, Electronic Signature ( Signed 11/29/2016) 2. PROGNOSTIC INDICATORS Results: IMMUNOHISTOCHEMICAL AND MORPHOMETRIC ANALYSIS PERFORMED MANUALLY Estrogen Receptor: 0%, NEGATIVE Progesterone Receptor: 0%, NEGATIVE Proliferation Marker Ki67: 20% COMMENT: The negative hormone receptor study(ies) in this case has no internal positive control.   Diagnosis 03/14/16 Breast, lumpectomy, Left - HIGH GRADE DUCTAL CARCINOMA IN SITU WITH COMEDONECROSIS AND CALCIFICATIONS, SPANNING APPROXIMATELY 1.5 CM IN GREATEST DIMENSION. - ATYPICAL LOBULAR HYPERPLASIA. - DUCTAL CARCINOMA IN SITU IS LESS THAN 0.2 CM IN SEVERAL AREAS TO THE ANTERIOR MARGIN. 1 of 3 FINAL for Leoni, Kennley M ([id number]) Diagnosis(continued) - DUCTAL CARCINOMA IN SITU IS FOCALLY LESS THAN 0.2 CM TO THE SUPERIOR AND POSTERIOR MARGINS. - OTHER MARGINS ARE NEGATIVE. - SEE ONCOLOGY TEMPLATE. Microscopic Comment BREAST, IN SITU CARCINOMA Specimen, including laterality: Left partial breast. Procedure (include lymph node sampling sentinel-non-sentinel): Left breast lumpectomy. Grade of carcinoma: High grade. Necrosis: Yes, comedonecrosis is present. Estimated tumor size: (gross measurement): 1.5 cm. Treatment effect: Not applicable. Distance to closest margin: Ductal carcinoma  in situ is less than 0.2 cm from anterior, superior and posterior margins. Breast prognostic profile: Performed on previous case [id number]. Estrogen receptor: 0%, negative. Progesterone receptor: 0%, negative. Lymph nodes: No lymph nodes received. TNM: pTis, pNX. Comments: A p63, smooth muscle myosin and calponin immunohistochemical stain is performed on three blocks (nine stains total) to rule out the possibility of invasive carcinoma. The staining pattern fails to demonstrate evidence of invasive carcinoma. As estrogen receptor and progesterone receptor were previously negative, these markers will be repeated on the tumor and reported in an addendum to follow. (RH:ds 03/16/15) PROGNOSTIC INDICATORS Results: IMMUNOHISTOCHEMICAL AND MORPHOMETRIC ANALYSIS PERFORMED MANUALLY Estrogen Receptor: 0%, NEGATIVE Progesterone Receptor: 0%, NEGATIVE COMMENT: The negative hormone receptor study(ies) in this case has an internal positive control.    Diagnosis 10/13/2014 Breast, left, needle core biopsy, calcs - HIGH GRADE DUCTAL CARCINOMA IN SITU WITH CALCIFICATIONS AND NECROSIS. - SEE MICROSCOPIC DESCRIPTION. - FOCAL ATYPICAL LOBULAR HYPERPLASIA. Microscopic Comment Immunohistochemistry for basal cell markers show positivity with Calponin, p63, and smooth muscle myosin supporting the diagnosis of high grade ductal carcinoma in situ. Estrogen and progesterone receptors will be performed. Results: IMMUNOHISTOCHEMICAL AND MORPHOMETRIC ANALYSIS PERFORMED MANUALLY Estrogen Receptor: 0%, NEGATIVE Progesterone Receptor: 0%, NEGATIVE   RADIOGRAPHIC STUDIES: I have personally reviewed the radiological images as listed and agreed with the findings in the report.  PET 01/10/17  IMPRESSION: 1. Right subareolar mass measures 4.7 cm in long axis with maximum SUV 23.6 compatible with malignancy. There are 2 hypermetabolic mildly  enlarged right axillary lymph nodes but no other metastatic lesions  are identified. 2. Other imaging findings of potential clinical significance: Aortic Atherosclerosis (ICD10-I70.0). Coronary atherosclerosis. Mild cardiomegaly. Trace left pleural effusion. Intracranial chronic ischemic microvascular white matter disease.  Diagnostic Mammogram and [place] 11/26/16  IMPRESSION:  The is a 4.2 cm lobulated mass in the right breast central to the nipple anterior depth is highly suggestive of malignancy. The lymph node in the right axilla is at a moderate concern but not classic for malignancy. An ultrasound guided biopsy is recommended.     Mammogram 09/28/2014 There is a new cluster of pleomorphic calcifications in the left breast at 12:00 middle depth.   ASSESSMENT & PLAN: 83 y.o. Caucasian female, postmenopausal, with multiple comorbidities, including a stroke in January 2016, wheelchair bound, dementia, myocardial infarction, CHF, a resident at assisted living and h/o of left breast DCIS in 2016.    1. Infiltrating ductal carcinoma of right breast, invasive ductal carcinoma, cT 3N1M0, stage IIIb, G2, triple Negative -I reviewed her image findings and her biopsy results. She has aggressive triple negative breast cancer.  -On physical exam, she has a large central breast mass, with bulky right adenopathy.   -I reviewed her staging PET scan, which showed no distant metastasis -Due to her rapidly growing disease she was evaluated for right mastectomy with Dr. Dalbert Batman, but due to her high risk of surgery and anesthesia secondary to her heart condition, patient's daughter has decided not to pursue mastectomy.   -Given her advanced age and comorbidities, she is not a candidate for chemotherapy.  -Due to her triple negative disease she is not a candidate for antiestrogen therapy.  -Upon breast exam today, there are signs that her tumor is starting to invade her skin more and increasing in size. I recommend palliative radiation to try to shrink her tumor. Her daughter  agreed. I will send rad onc referral.  -Given her recurrent breast cancer and family history she is eligible for genetic testing. If her results show she has BRCA gene mutation she can consider PARP inhibitor  -F/u in 8 weeks  2. H/o Left breast DCIS, high-grade, ER negative/PR negative, 2016  -Reviewed her scan and breast biopsy results. -The patient had early stage disease. She is considered cured of disease after completing surgical resection.  -She was seen by Dr. Dalbert Batman, lumpectomy without sentinel lymph nodes biopsy was discussed with patient and her daughter. Given her advanced age and medical comorbidities, she does have some risk with anesthesia. Patient and her daughter are in favor of having surgery. Any form of adjuvant treatment is for prevention of disease recurrence.  -Given her negative ER and PR status of her tumor, I do not recommend any antiestrogen therapy.  -If she has wide negative surgical margin, she may not benefit much from adjuvant radiation and also, given her advanced age. She was seen by Dr. Pablo Ledger  -She underwent Left Breast Lumpectomy on 03/15/15 by Dr. Dalbert Batman    3. Dementia -Wheelchair bound, She can feed herself, otherwise she is under full scale care at [facility].  -Her daughter is her Legal guardian    Plan  -Rad/onc referral for palliative right breast radiation -Genetic referral for BRCA1/2 mutations to see if she is a candidate for PARP inhibitor. She has no biological children, may not need extensive genetic testing -Lab and f/u in 8 weeks   All questions were answered. The patient knows to call the clinic with any problems, questions or concerns. I  spent 25 minutes counseling the patient face to face. The total time spent in the appointment was 30 minutes and more than 50% was on counseling.     Truitt Merle, MD 02/03/2017  5:52 PM   This document serves as a record of services personally performed by Truitt Merle, MD. It was created on her  behalf by Joslyn Devon, a trained medical scribe. The creation of this record is based on the scribe's personal observations and the provider's statements to them.    I have reviewed the above documentation for accuracy and completeness, and I agree with the above.

## 2017-02-03 ENCOUNTER — Telehealth: Payer: Self-pay | Admitting: Hematology

## 2017-02-03 ENCOUNTER — Ambulatory Visit (HOSPITAL_BASED_OUTPATIENT_CLINIC_OR_DEPARTMENT_OTHER): Payer: Medicare Other | Admitting: Hematology

## 2017-02-03 ENCOUNTER — Encounter: Payer: Self-pay | Admitting: Hematology

## 2017-02-03 VITALS — BP 158/83 | HR 65 | Temp 98.5°F | Resp 24 | Ht 66.0 in | Wt 147.5 lb

## 2017-02-03 DIAGNOSIS — C50111 Malignant neoplasm of central portion of right female breast: Secondary | ICD-10-CM | POA: Diagnosis not present

## 2017-02-03 DIAGNOSIS — Z86000 Personal history of in-situ neoplasm of breast: Secondary | ICD-10-CM | POA: Diagnosis not present

## 2017-02-03 DIAGNOSIS — C50412 Malignant neoplasm of upper-outer quadrant of left female breast: Secondary | ICD-10-CM

## 2017-02-03 DIAGNOSIS — Z171 Estrogen receptor negative status [ER-]: Secondary | ICD-10-CM

## 2017-02-03 DIAGNOSIS — F0390 Unspecified dementia without behavioral disturbance: Secondary | ICD-10-CM | POA: Diagnosis not present

## 2017-02-03 NOTE — Telephone Encounter (Signed)
Gave avs and calendar for january °

## 2017-02-04 ENCOUNTER — Non-Acute Institutional Stay (SKILLED_NURSING_FACILITY): Payer: Medicare Other | Admitting: Nurse Practitioner

## 2017-02-04 ENCOUNTER — Encounter: Payer: Self-pay | Admitting: Nurse Practitioner

## 2017-02-04 DIAGNOSIS — R413 Other amnesia: Secondary | ICD-10-CM

## 2017-02-04 DIAGNOSIS — I5022 Chronic systolic (congestive) heart failure: Secondary | ICD-10-CM

## 2017-02-04 DIAGNOSIS — I5032 Chronic diastolic (congestive) heart failure: Secondary | ICD-10-CM

## 2017-02-04 DIAGNOSIS — D518 Other vitamin B12 deficiency anemias: Secondary | ICD-10-CM | POA: Diagnosis not present

## 2017-02-04 DIAGNOSIS — I10 Essential (primary) hypertension: Secondary | ICD-10-CM | POA: Diagnosis not present

## 2017-02-04 DIAGNOSIS — I110 Hypertensive heart disease with heart failure: Secondary | ICD-10-CM | POA: Diagnosis not present

## 2017-02-04 DIAGNOSIS — C50211 Malignant neoplasm of upper-inner quadrant of right female breast: Secondary | ICD-10-CM | POA: Diagnosis not present

## 2017-02-04 DIAGNOSIS — I350 Nonrheumatic aortic (valve) stenosis: Secondary | ICD-10-CM | POA: Diagnosis not present

## 2017-02-04 DIAGNOSIS — Z01810 Encounter for preprocedural cardiovascular examination: Secondary | ICD-10-CM | POA: Diagnosis not present

## 2017-02-04 NOTE — Assessment & Plan Note (Addendum)
The patient has history of HTN, continue  Isosorbide 90mg  qd, Clonidine 0.1mg  bid, Carvedilol 12.5mg  bid.

## 2017-02-04 NOTE — Progress Notes (Signed)
Location of Breast Cancer: Right Breast  Histology per Pathology Report:  11/26/16 Diagnosis 1. Breast, right, needle core biopsy, 12:00 o'clock - INVASIVE DUCTAL CARCINOMA - SEE COMMENT Receptor Status: ER (NEG), PR (NEG), Her2 (NEG), Ki- (20%) 2. Lymph node, needle/core biopsy, right axilla - METASTATIC CARCINOMA INVOLVING ONE LYMPH NODE  Receptor Status: ER(NEG), PR (NEG), Her2-neu (NEG), Ki-(20%)  Did patient present with symptoms or was this found on screening mammography?: It was found on a screening mammogram.   Past/Anticipated interventions by surgeon, if any: None. Surgery not anticipated because of poor health issues.   Past/Anticipated interventions by medical oncology, if any:  02/03/17 Dr. Burr Medico Plan  -Rad/onc referral for palliative right breast radiation -Genetic referral for BRCA1/2 mutations to see if she is a candidate for PARP inhibitor. She has no biological children, may not need extensive genetic testing -Lab and f/u in 8 weeks   Lymphedema issues, if any: N/A   Pain issues, if any:  She denies  SAFETY ISSUES:  Prior radiation? No  Pacemaker/ICD? No  Possible current pregnancy? No  Is the patient on methotrexate? No  Current Complaints / other details:   H/o Left breast DCIS, high-grade, ER negative/PR negative, 2016  -Reviewed her scan and breast biopsy results. -The patient had early stage disease. She is considered cured of disease after completing surgical resection.  -She was seen by Dr. Dalbert Batman, lumpectomy without sentinel lymph nodes biopsy was discussed with patient and her daughter. Given her advanced age and medical comorbidities, she does have some risk with anesthesia. Patient and her daughter are in favor of having surgery. Any form of adjuvant treatment is for prevention of disease recurrence.   BP (!) 155/77   Pulse 68   Temp 98.9 F (37.2 C)   Ht '5\' 6"'$  (1.676 m)   Wt 145 lb 9.6 oz (66 kg) Comment: unable to stand  SpO2 96%  Comment: room air  BMI 23.50 kg/m    Wt Readings from Last 3 Encounters:  02/05/17 145 lb 9.6 oz (66 kg)  02/04/17 146 lb 11.2 oz (66.5 kg)  02/03/17 147 lb 8 oz (66.9 kg)        Terryl Molinelli, Stephani Police, RN 02/04/2017,4:29 PM

## 2017-02-04 NOTE — Assessment & Plan Note (Signed)
CHF compensated clinically, continue Tosemide 10mg  qd.

## 2017-02-04 NOTE — Progress Notes (Signed)
Location:   [facility] Room Number: 5 Place of Service:  SNF (31) Provider: [facility] Selma Mink NP  [name], MD  Patient Care Team: [name], MD as PCP - General (Internal Medicine) Juanita Craver, MD as Consulting Physician (Gastroenterology) Shon Hough, MD as Consulting Physician (Ophthalmology) Thornell Sartorius, MD as Consulting Physician (Otolaryngology) [place] Cristiano Capri X, NP as Nurse Practitioner (Nurse Practitioner) Bo Merino, MD as Consulting Physician (Rheumatology) Suella Broad, MD as Consulting Physician (Physical Medicine and Rehabilitation) Netta Cedars, MD as Consulting Physician (Orthopedic Surgery) Fanny Skates, MD as Consulting Physician (General Surgery) Truitt Merle, MD as Consulting Physician (Hematology) Thea Silversmith, MD (Inactive) as Consulting Physician (Radiation Oncology) Rockwell Germany, RN as Registered Nurse Mauro Kaufmann, RN as Registered Nurse Jake Shark, Johny Blamer, NP as Nurse Practitioner (Nurse Practitioner) [name], MD as Consulting Physician (Cardiology)  Extended Emergency Contact Information Primary Emergency Contact: [name],[name] Address: [address]          [place] [address] [place] Phone: [phone] Mobile Phone: [phone] Relation: Daughter Secondary Emergency Contact: [name],[name]  [place] Mobile Phone: [phone] Relation: None  Code Status:  DNR Goals of care: Advanced Directive information Advanced Directives 01/31/2017  Does Patient Have a Medical Advance Directive? Yes  Type of Advance Directive Out of facility DNR (pink MOST or yellow form)  Does patient want to make changes to medical advance directive? No - Patient declined  Copy of [place] in Chart? -  Pre-existing out of facility DNR order (yellow form or pink MOST form) Yellow form placed in chart (order not valid for inpatient  use);Pink MOST form placed in chart (order not valid for inpatient use)     Chief Complaint  Patient presents with  . Medical Management of Chronic Issues    HPI:  Pt is a 83 y.o. female seen today for medical management of chronic diseases.      The patient has history of HTN, controlled on Isosorbide 90mg  qd, Clonidine 0.1mg  bid, Carvedilol 12.5mg  bid. Her anemia is managed with Vit B12 9622WLN 3x/week Folic acid 1mg  qd, last Hgb 9.3 01/14/17. CHF compensated clinically, osemide 10mg  qd.    Past Medical History:  Diagnosis Date  . Arthritis    Osteoarthritis  . Cancer of left breast [facility]) August 2016  . Cerebral atherosclerosis 12/19/2012  . Cerebral atrophy 12/19/2012  . Cerebral ischemia 12/19/2012   Microvascular   . CKD (chronic kidney disease) stage 3, GFR 30-59 ml/min (HCC) 11/03/2014  . Cough   . Dementia   . Diverticulosis of colon (without mention of hemorrhage)   . Edema   . Flat feet   . GERD (gastroesophageal reflux disease)   . Glaucoma   . Heart murmur   . Hyperlipidemia   . Hypertension   . Hypertonicity of bladder   . Ischemic cardiomyopathy 11/04/2014  . Lumbago   . Memory change 03/28/2016  . Osteoarthrosis, unspecified whether generalized or localized, unspecified site   . Other malaise and fatigue   . Pain in joint, pelvic region and thigh   . Pain in joint, shoulder region   . Personal history of fall   . RLQ abdominal pain 01/17/2011  . Senile osteoporosis   . Small bowel obstruction ([place])   . STEMI (ST elevation myocardial infarction) ([place]) 11/04/2014  . Unstable gait 11/04/2014  . Urine, incontinence, stress female 09/18/2014   Past Surgical History:  Procedure Laterality Date  . BREAST  LUMPECTOMY Bilateral   . BREAST LUMPECTOMY WITH RADIOACTIVE SEED LOCALIZATION Left 03/15/2015   Procedure: LEFT BREAST LUMPECTOMY WITH RADIOACTIVE SEED LOCALIZATION;  Surgeon: Fanny Skates, MD;  Location: [facility];  Service: General;  Laterality: Left;  .  CARDIAC CATHETERIZATION N/A 11/08/2014   Procedure: Left Heart Cath and Coronary Angiography;  Surgeon: [name], MD;  Location: [facility] CV LAB;  Service: Cardiovascular;  Laterality: N/A;  . COLON SURGERY  02/1996   Colectomy, partial for diverticular bleed 90%  . EYE SURGERY    . JOINT REPLACEMENT Left 12/2000   Left knee; Dr. Wynelle Link  . LAPAROSCOPIC LYSIS OF ADHESIONS  11/15/10   Exploratory  . TONSILLECTOMY      Allergies  Allergen Reactions  . Sulfa Antibiotics Itching    Allergies as of 02/04/2017      Reactions   Sulfa Antibiotics Itching      Medication List        Accurate as of 02/04/17  1:54 PM. Always use your most recent med list.          acetaminophen 325 MG tablet Commonly known as:  TYLENOL Take 650 mg by mouth 2 (two) times daily. Take 2 tablets every 6 hours as needed for mild pain or fever   alum & mag hydroxide-simeth 200-200-20 MG/5ML suspension Commonly known as:  MAALOX/MYLANTA Take 30 mLs by mouth every 4 (four) hours as needed for indigestion.   ARTIFICIAL TEARS OP Place 2 drops 2 (two) times daily into both eyes.   atorvastatin 10 MG tablet Commonly known as:  LIPITOR Take 10 mg by mouth daily.   BETIMOL 0.5 % ophthalmic solution Generic drug:  timolol Place 1 drop daily into both eyes.   carvedilol 12.5 MG tablet Commonly known as:  COREG Take 12.5 mg by mouth 2 (two) times daily with a meal.   cloNIDine 0.1 MG tablet Commonly known as:  CATAPRES Take 0.1 mg by mouth 2 (two) times daily.   Coenzyme Q10 50 MG Tabs Take one tablet by mouth at bedtime   famotidine 20 MG tablet Commonly known as:  PEPCID Take 20 mg by mouth at bedtime.   folic acid 1 MG tablet Commonly known as:  FOLVITE Take 1 mg by mouth daily.   isosorbide mononitrate 60 MG 24 hr tablet Commonly known as:  IMDUR Take 90 mg by mouth at bedtime.   KLOR-CON 10 10 MEQ tablet Generic drug:  potassium chloride Take 10 mEq by mouth daily.     latanoprost 0.005 % ophthalmic solution Commonly known as:  XALATAN Place 1 drop into both eyes at bedtime.   torsemide 10 MG tablet Commonly known as:  DEMADEX Take 10 mg daily by mouth. Hold for SBP < 110   vitamin B-12 1000 MCG tablet Commonly known as:  CYANOCOBALAMIN Take 1,000 mcg See admin instructions by mouth. Take 1000 mcg by mouth daily On Mondays,Wednesdays and Fridays      ROS was provided with assistance of staff Review of Systems  Constitutional: Negative for activity change, appetite change, chills, diaphoresis, fatigue and fever.  HENT: Positive for hearing loss. Negative for congestion, trouble swallowing and voice change.   Eyes: Negative for visual disturbance.  Respiratory: Negative for cough, choking, chest tightness, shortness of breath and wheezing.   Cardiovascular: Negative for chest pain, palpitations and leg swelling.  Gastrointestinal: Negative for abdominal distention, abdominal pain, constipation, diarrhea, nausea and vomiting.  Endocrine: Negative for cold intolerance.  Genitourinary: Negative for difficulty urinating, dysuria, frequency  and urgency.       Occasionally incontinent urine at night.   Musculoskeletal: Positive for arthralgias and gait problem.       Wheelchair dependent.   Skin: Negative for color change, pallor, rash and wound.  Neurological: Negative for dizziness, tremors, speech difficulty and weakness.  Psychiatric/Behavioral: Positive for confusion. Negative for agitation, behavioral problems, hallucinations and sleep disturbance. The patient is not nervous/anxious.     Immunization History  Administered Date(s) Administered  . Influenza Whole 11/19/2011, 12/02/2012  . Influenza-Unspecified 12/06/2013, 12/06/2015, 12/02/2016  . PPD Test 11/10/2014  . Pneumococcal Conjugate-13 03/09/2009  . Pneumococcal Polysaccharide-23 02/18/2009  . Tdap 04/02/2016   Pertinent  Health Maintenance Due  Topic Date Due  . MAMMOGRAM   11/26/2017  . INFLUENZA VACCINE  Completed  . DEXA SCAN  Completed  . PNA vac Low Risk Adult  Completed   Fall Risk  01/31/2017 07/13/2015 11/17/2014 04/21/2014 12/17/2012  Falls in the past year? No No Yes Yes Yes  Number falls in past yr: - - 1 1 1   Injury with Fall? - - No No Yes  Comment - - - - nose lacteration  Risk for fall due to : History of fall(s);Impaired balance/gait;Impaired vision - Impaired balance/gait;History of fall(s) - -   Functional Status Survey:    Vitals:   02/04/17 1248  BP: 120/72  Pulse: 75  Resp: 18  Temp: 98.6 F (37 C)  Weight: 146 lb 11.2 oz (66.5 kg)  Height: 5\' 6"  (1.676 m)   Body mass index is 23.68 kg/m. Physical Exam  Constitutional: She appears well-developed and well-nourished. No distress.  HENT:  Head: Normocephalic and atraumatic.  Eyes: Conjunctivae and EOM are normal. Pupils are equal, round, and reactive to light.  Neck: Normal range of motion. Neck supple. No JVD present. No thyromegaly present.  Cardiovascular: Normal rate and regular rhythm.  Murmur heard. Pulmonary/Chest: Effort normal and breath sounds normal. She has no wheezes. She has no rales.  Abdominal: Soft. Bowel sounds are normal. She exhibits no distension. There is no tenderness.  Musculoskeletal: Normal range of motion. She exhibits tenderness. She exhibits no edema.  One person transfer, wheelchair to get around. Knee pain, worse with wight bearing.   Neurological: She is alert. Coordination normal.  Oriented to person and place.   Skin: Skin is warm and dry. No rash noted. No erythema. No pallor.  Psychiatric: She has a normal mood and affect. Her behavior is normal. Judgment and thought content normal.    Labs reviewed: Recent Labs    08/24/16 1053 08/25/16 0105 08/26/16 0427  09/10/16 12/10/16 12/26/16  NA 140 138 138   < > 140 138 139  K 3.8 3.4* 4.0   < > 4.1 4.2 4.2  CL 109 106 105  --   --   --   --   CO2 21* 22 21*  --   --   --   --   GLUCOSE  127* 161* 137*  --   --   --   --   BUN 35* 41* 64*   < > 33* 37* 34*  CREATININE 2.04* 2.20* 2.90*   < > 2.7* 2.4* 2.6*  CALCIUM 8.8* 8.5* 8.2*  --   --   --   --   MG  --  1.9  --   --   --   --   --    < > = values in this interval not displayed.   Recent  Labs    07/30/16 08/24/16 1053 12/10/16  AST 15  14 17  12*  ALT 19  6* 9* 5*  ALKPHOS 33  36 42 37  BILITOT  --  0.9  --   PROT  --  7.1  --   ALBUMIN  --  3.8  --    Recent Labs    08/24/16 1053 08/25/16 0105 08/26/16 0427  12/10/16 12/26/16 01/14/17  WBC 10.8* 19.8* 12.9*   < > 6.1 7.8 6.5  NEUTROABS 8.2*  --   --   --   --   --   --   HGB 12.2 11.1* 10.0*   < > 9.7* 11.1* 9.3*  HCT 36.3 33.0* 30.6*   < > 29* 33* 28*  MCV 89.9 88.2 86.4  --   --   --   --   PLT 186 190 197   < > 207 228 197   < > = values in this interval not displayed.   Lab Results  Component Value Date   TSH 2.21 04/04/2016   Lab Results  Component Value Date   HGBA1C 5.6 11/06/2014   Lab Results  Component Value Date   CHOL 108 11/28/2016   HDL 50 11/28/2016   LDLCALC 41 11/28/2016   TRIG 85 11/28/2016   CHOLHDL 3.1 11/06/2014    Significant Diagnostic Results in last 30 days:  Nm Pet Image Initial (pi) Skull Base To Thigh  Result Date: 01/10/2017 CLINICAL DATA:  Initial treatment strategy for right breast cancer. History of left breast cancer in 2012. EXAM: NUCLEAR MEDICINE PET SKULL BASE TO THIGH TECHNIQUE: 7.1 mCi F-18 FDG was injected intravenously. Full-ring PET imaging was performed from the skull base to thigh after the radiotracer. CT data was obtained and used for attenuation correction and anatomic localization. FASTING BLOOD GLUCOSE:  Value: 92 mg/dl COMPARISON:  CT abdomen 11/15/2010 FINDINGS: NECK No hypermetabolic lymph nodes in the neck. Intracranial chronic ischemic microvascular white matter disease. Chronic left maxillary sinusitis. CHEST 4.7 by 4.5 cm right subareolar mass with maximum SUV 23.6. There are 2  hypermetabolic right axillary lymph nodes. The more posterior node measures 1.2 cm in short axis on image 59/4 with a maximum SUV of 11.5. The more anterior lymph node measures 1.2 cm in short axis on image 61/4 with maximum SUV 9.2. Trace left pleural effusion. Coronary, aortic arch, and branch vessel atherosclerotic vascular disease. Mild cardiomegaly. Mild biapical pleuroparenchymal scarring. ABDOMEN/PELVIS No abnormal hypermetabolic activity within the liver, pancreas, adrenal glands, or spleen. No hypermetabolic lymph nodes in the abdomen or pelvis. Physiologic activity in bowel, with focally accentuated activity near the sigmoid anastomotic site where the maximum SUV is 13.1. I suspect that this is likely physiologic. Trace amount of gas in the urinary bladder, query recent catheterization. Aortoiliac atherosclerotic vascular disease. SKELETON Degenerative activity along the glenohumeral joints. No current findings of active osseous malignancy. IMPRESSION: 1. Right subareolar mass measures 4.7 cm in long axis with maximum SUV 23.6 compatible with malignancy. There are 2 hypermetabolic mildly enlarged right axillary lymph nodes but no other metastatic lesions are identified. 2. Other imaging findings of potential clinical significance: Aortic Atherosclerosis (ICD10-I70.0). Coronary atherosclerosis. Mild cardiomegaly. Trace left pleural effusion. Intracranial chronic ischemic microvascular white matter disease. Electronically Signed   By: Van Clines M.D.   On: 01/10/2017 12:02    Assessment/Plan  Hypertensive heart disease with chronic systolic congestive heart failure ([place]) The patient has history of HTN, continue  Isosorbide 90mg  qd, Clonidine  0.1mg  bid, Carvedilol 12.5mg  bid.    Anemia Her anemia is managed, continue Vit B12 1517OHY 3x/week,  Folic acid 1mg  qd, last Hgb 9.3  Vit B12 952 01/14/17.  Chronic diastolic congestive heart failure (HCC) CHF compensated clinically, continue  Tosemide 10mg  qd.    Hypertension Controlled, continue Isosorbide 90mg  qd, Clonidine 0.1mg  bid, Carvedilol 12.5mg  bid.  Memory change Forgetful, resides in SNF is adequate. Pending MMSE   Family/ staff Communication: plan of care reviewed with the patient and charge nurse.   Labs/tests ordered: none  Time spend 25 minutes

## 2017-02-04 NOTE — Assessment & Plan Note (Addendum)
Her anemia is managed, continue Vit B12 1225YTM 3x/week,  Folic acid 1mg  qd, last Hgb 9.3  Vit B12 952 01/14/17.

## 2017-02-04 NOTE — Assessment & Plan Note (Addendum)
Forgetful, resides in SNF is adequate. Pending MMSE

## 2017-02-04 NOTE — Assessment & Plan Note (Signed)
Controlled, continue Isosorbide 90mg  qd, Clonidine 0.1mg  bid, Carvedilol 12.5mg  bid.

## 2017-02-05 ENCOUNTER — Ambulatory Visit
Admission: RE | Admit: 2017-02-05 | Discharge: 2017-02-05 | Disposition: A | Discharge status: Home / Self Care | Payer: Medicare Other | Source: Ambulatory Visit | Attending: Radiation Oncology | Admitting: Radiation Oncology

## 2017-02-05 ENCOUNTER — Encounter: Payer: Self-pay | Admitting: Radiation Oncology

## 2017-02-05 ENCOUNTER — Encounter: Payer: Self-pay | Admitting: *Deleted

## 2017-02-05 VITALS — BP 155/77 | HR 68 | Temp 98.9°F | Ht 66.0 in | Wt 145.6 lb

## 2017-02-05 DIAGNOSIS — Z79899 Other long term (current) drug therapy: Secondary | ICD-10-CM | POA: Diagnosis not present

## 2017-02-05 DIAGNOSIS — F0390 Unspecified dementia without behavioral disturbance: Secondary | ICD-10-CM | POA: Insufficient documentation

## 2017-02-05 DIAGNOSIS — Z9049 Acquired absence of other specified parts of digestive tract: Secondary | ICD-10-CM | POA: Diagnosis not present

## 2017-02-05 DIAGNOSIS — M810 Age-related osteoporosis without current pathological fracture: Secondary | ICD-10-CM | POA: Insufficient documentation

## 2017-02-05 DIAGNOSIS — I129 Hypertensive chronic kidney disease with stage 1 through stage 4 chronic kidney disease, or unspecified chronic kidney disease: Secondary | ICD-10-CM | POA: Insufficient documentation

## 2017-02-05 DIAGNOSIS — Z170 Estrogen receptor positive status [ER+]: Secondary | ICD-10-CM

## 2017-02-05 DIAGNOSIS — E785 Hyperlipidemia, unspecified: Secondary | ICD-10-CM | POA: Diagnosis not present

## 2017-02-05 DIAGNOSIS — I252 Old myocardial infarction: Secondary | ICD-10-CM | POA: Insufficient documentation

## 2017-02-05 DIAGNOSIS — Z87891 Personal history of nicotine dependence: Secondary | ICD-10-CM | POA: Diagnosis not present

## 2017-02-05 DIAGNOSIS — D0511 Intraductal carcinoma in situ of right breast: Secondary | ICD-10-CM | POA: Diagnosis not present

## 2017-02-05 DIAGNOSIS — M1990 Unspecified osteoarthritis, unspecified site: Secondary | ICD-10-CM | POA: Insufficient documentation

## 2017-02-05 DIAGNOSIS — Z86000 Personal history of in-situ neoplasm of breast: Secondary | ICD-10-CM | POA: Insufficient documentation

## 2017-02-05 DIAGNOSIS — C773 Secondary and unspecified malignant neoplasm of axilla and upper limb lymph nodes: Secondary | ICD-10-CM | POA: Diagnosis not present

## 2017-02-05 DIAGNOSIS — Z171 Estrogen receptor negative status [ER-]: Principal | ICD-10-CM

## 2017-02-05 DIAGNOSIS — C50411 Malignant neoplasm of upper-outer quadrant of right female breast: Secondary | ICD-10-CM

## 2017-02-05 DIAGNOSIS — Z510 Encounter for antineoplastic radiation therapy: Secondary | ICD-10-CM | POA: Diagnosis not present

## 2017-02-05 DIAGNOSIS — Z8673 Personal history of transient ischemic attack (TIA), and cerebral infarction without residual deficits: Secondary | ICD-10-CM | POA: Insufficient documentation

## 2017-02-05 DIAGNOSIS — K219 Gastro-esophageal reflux disease without esophagitis: Secondary | ICD-10-CM | POA: Diagnosis not present

## 2017-02-05 DIAGNOSIS — C50111 Malignant neoplasm of central portion of right female breast: Secondary | ICD-10-CM

## 2017-02-05 DIAGNOSIS — N183 Chronic kidney disease, stage 3 (moderate): Secondary | ICD-10-CM | POA: Insufficient documentation

## 2017-02-05 DIAGNOSIS — Z96652 Presence of left artificial knee joint: Secondary | ICD-10-CM | POA: Insufficient documentation

## 2017-02-05 DIAGNOSIS — Z9181 History of falling: Secondary | ICD-10-CM | POA: Diagnosis not present

## 2017-02-05 NOTE — Progress Notes (Signed)
  Radiation Oncology         [phone] ________________________________  Name: SYANN CUPPLES MRN: [id number]  Date: 02/05/2017  DOB: [birth year]  SIMULATION AND TREATMENT PLANNING NOTE    Outpatient  DIAGNOSIS:     ICD-10-CM   1. Malignant neoplasm of central portion of right breast in female, estrogen receptor negative ([place]) C50.111    Z17.1    NARRATIVE:  The patient was brought to the [facility].  Identity was confirmed.  All relevant records and images related to the planned course of therapy were reviewed.  The patient freely provided informed written consent to proceed with treatment after reviewing the details related to the planned course of therapy. The consent form was witnessed and verified by the simulation staff.    Then, the patient was set-up in a stable reproducible supine position for radiation therapy with her ipsilateral arm over her head, and her upper body secured in a custom-made Vac-lok device.  CT images were obtained.  Surface markings were placed.  The CT images were loaded into the planning software.    TREATMENT PLANNING NOTE: Treatment planning then occurred.  The radiation prescription was entered and confirmed.     A total of 3 medically necessary complex treatment devices were fabricated and supervised by me: 2 fields with MLCs for custom blocks to protect heart, and lungs;  and, a Vac-lok. MORE COMPLEX DEVICES MAY BE MADE IN DOSIMETRY FOR FIELD IN FIELD BEAMS FOR DOSE HOMOGENEITY.  I have requested : 3D Simulation which is medically necessary to give adequate dose to at risk tissues while sparing lungs and heart.  I have requested a DVH of the following structures: lungs, heart, right breast tumor.    The patient will receive 42.56 Gy in 16 fractions to the right breast /axilla with 2 high  tangential fields.  This will be followed by a boost.  Optical Surface Tracking Plan:  Since intensity modulated radiotherapy (IMRT) and 3D conformal  radiation treatment methods are predicated on accurate and precise positioning for treatment, intrafraction motion monitoring is medically necessary to ensure accurate and safe treatment delivery. The ability to quantify intrafraction motion without excessive ionizing radiation dose can only be performed with optical surface tracking. Accordingly, surface imaging offers the opportunity to obtain 3D measurements of patient position throughout IMRT and 3D treatments without excessive radiation exposure. I am ordering optical surface tracking for this patient's upcoming course of radiotherapy.  ________________________________   Reference:  Ursula Alert, J, et al. Surface imaging-based analysis of intrafraction motion for breast radiotherapy patients.Journal of [place], n. 6, nov. 2014. ISSN [id number].  Available at: <http://www.jacmp.org/index.php/jacmp/article/view/4957>.    -----------------------------------  Eppie Gibson, MD

## 2017-02-05 NOTE — Progress Notes (Signed)
Radiation Oncology         [phone] ________________________________  Initial outpatient Consultation  Name: [name] MRN: [id number]  Date: 02/05/2017  DOB: May 14, 1925  PZ:WCHENI, Mahima, MD  Fanny Skates, MD   REFERRING PHYSICIAN: Fanny Skates, MD  DIAGNOSIS:    ICD-10-CM   1. Malignant neoplasm of central portion of right breast in female, estrogen receptor negative ([facility]) C50.111    Z17.1    Stage T3N1M0, Right Breast, 12 o'clock, Invasive Ductal Carcinoma, ER (NEG)  / PR (NEG) / Her2 (NEG), Grade 2   Cancer Staging Breast cancer of upper-outer quadrant of left female breast ([place]) Staging form: Breast, AJCC 7th Edition - Clinical: Stage 0 (Tis (DCIS), N0, M0) - Unsigned - Pathologic stage from 03/15/2015: Stage Unknown (Tis (DCIS), NX, cM0) - Unsigned  Cancer of central portion of right female breast ([place]) Staging form: Breast, AJCC 8th Edition - Clinical stage from 11/26/2016: Stage IIIB (cT3, cN1, cM0, G2, ER: Negative, PR: Negative, HER2: Negative) - Signed by Truitt Merle, MD on 12/26/2016    CHIEF COMPLAINT: Here to discuss management of right breast cancer  HISTORY OF PRESENT ILLNESS::[name] DEA is a 83 y.o. 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 It was ER and PR negative with negative margins but close margins, she did not receive adjuvant radiation.  She presents today with right breast DCIS, triple negative and she was previously seen by Dr. Burr Medico in breast clinic on 02/03/2017. The tumor was discovered during a routine mammogram screening. She also presents with multiple comorbidities and a Hx of a stroke. Biopsy on 11/26/2016 revealed invasive ductal carcinoma of the right breast in the 12:00 o'clock region and the receptor status are: ER (NEG), PR (NEG), Her2 (NEG), Ki-(20%) and metastatic carcinoma involving one lymph node in the right axilla that is triple  negative. Due to the patient's poor health issues, surgery is not anticipated.The patient has significant dementia and she lives in a nursing home. The patient presents today with a CNA from the nursing home, [facility]. Pt reports that she chokes easily and she sometimes has burning during urination. Patient has chronic pain from the knee down especially when she walks, which is nonambulatory. Pt denies any pain from the tumor, headaches, chest pain, SOB,constipation and diarrhea.  PREVIOUS RADIATION THERAPY: No  PAST MEDICAL HISTORY:  has a past medical history of Arthritis, Cancer of left breast [facility]) (August 2016), Cerebral atherosclerosis (12/19/2012), Cerebral atrophy (12/19/2012), Cerebral ischemia (12/19/2012), CKD (chronic kidney disease) stage 3, GFR 30-59 ml/min (HCC) (11/03/2014), Cough, Dementia, Diverticulosis of colon (without mention of hemorrhage), Edema, Flat feet, GERD (gastroesophageal reflux disease), Glaucoma, Heart murmur, Hyperlipidemia, Hypertension, Hypertonicity of bladder, Ischemic cardiomyopathy (11/04/2014), Lumbago, Memory change (03/28/2016), Osteoarthrosis, unspecified whether generalized or localized, unspecified site, Other malaise and fatigue, Pain in joint, pelvic region and thigh, Pain in joint, shoulder region, Personal history of fall, RLQ abdominal pain (01/17/2011), Senile osteoporosis, Small bowel obstruction ([place]), STEMI (ST elevation myocardial infarction) ([place]) (11/04/2014), Unstable gait (11/04/2014), and Urine, incontinence, stress female (09/18/2014).    PAST SURGICAL HISTORY: Past Surgical History:  Procedure Laterality Date  . BREAST LUMPECTOMY Bilateral   . BREAST LUMPECTOMY WITH RADIOACTIVE SEED LOCALIZATION Left 03/15/2015   Procedure: LEFT BREAST LUMPECTOMY WITH RADIOACTIVE SEED LOCALIZATION;  Surgeon: Fanny Skates, MD;  Location: [facility];  Service: General;  Laterality: Left;  . CARDIAC CATHETERIZATION N/A 11/08/2014   Procedure:  Left Heart Cath  and Coronary Angiography;  Surgeon: [name], MD;  Location: [facility];  Service: Cardiovascular;  Laterality: N/A;  . COLON SURGERY  02/1996   Colectomy, partial for diverticular bleed 90%  . EYE SURGERY    . JOINT REPLACEMENT Left 12/2000   Left knee; Dr. Wynelle Link  . LAPAROSCOPIC LYSIS OF ADHESIONS  11/15/10   Exploratory  . TONSILLECTOMY      FAMILY HISTORY: family history includes Cancer in her cousin and maternal aunt; Cancer (age of onset: 10) in her father.  SOCIAL HISTORY:  reports that she quit smoking about 36 years ago. Her smoking use included cigarettes. She has a 15.00 pack-year smoking history. she has never used smokeless tobacco. She reports that she drinks about 0.6 oz of alcohol per week. She reports that she does not use drugs.  ALLERGIES: Sulfa antibiotics  MEDICATIONS:  Current Outpatient Medications  Medication Sig Dispense Refill  . acetaminophen (TYLENOL) 325 MG tablet Take 650 mg by mouth 2 (two) times daily. Take 2 tablets every 6 hours as needed for mild pain or fever    . alum & mag hydroxide-simeth (MAALOX/MYLANTA) 200-200-20 MG/5ML suspension Take 30 mLs by mouth every 4 (four) hours as needed for indigestion.     Marland Kitchen amLODipine (NORVASC) 2.5 MG tablet Take 2.5 mg by mouth daily.    Marland Kitchen atorvastatin (LIPITOR) 10 MG tablet Take 10 mg by mouth daily.    Marland Kitchen BETIMOL 0.5 % ophthalmic solution Place 1 drop daily into both eyes.     . carvedilol (COREG) 12.5 MG tablet Take 12.5 mg by mouth 2 (two) times daily with a meal.    . cloNIDine (CATAPRES) 0.1 MG tablet Take 0.1 mg by mouth 2 (two) times daily.    . Coenzyme Q10 50 MG TABS Take one tablet by mouth at bedtime    . famotidine (PEPCID) 20 MG tablet Take 20 mg by mouth at bedtime.     . folic acid (FOLVITE) 1 MG tablet Take 1 mg by mouth daily.    . Hypromellose (ARTIFICIAL TEARS OP) Place 2 drops 2 (two) times daily into both eyes.     . isosorbide mononitrate (IMDUR) 60 MG 24 hr tablet Take 90 mg by  mouth at bedtime.     Marland Kitchen KLOR-CON 10 10 MEQ tablet Take 10 mEq by mouth daily.    Marland Kitchen latanoprost (XALATAN) 0.005 % ophthalmic solution Place 1 drop into both eyes at bedtime.     . torsemide (DEMADEX) 10 MG tablet Take 10 mg daily by mouth. Hold for SBP < 110    . vitamin B-12 (CYANOCOBALAMIN) 1000 MCG tablet Take 1,000 mcg See admin instructions by mouth. Take 1000 mcg by mouth daily On Mondays,Wednesdays and Fridays     No current facility-administered medications for this encounter.     REVIEW OF SYSTEMS: A 10+ POINT REVIEW OF SYSTEMS WAS OBTAINED including neurology, dermatology, psychiatry, cardiac, respiratory, lymph, extremities, GI, GU, Musculoskeletal, constitutional, breasts, reproductive, HEENT.  All pertinent positives are noted in the HPI.  All others are negative.   PHYSICAL EXAM:  height is _0  (1.676 m) and weight is 145 lb 9.6 oz (66 kg). Her temperature is 98.9 F (37.2 C). Her blood pressure is 155/77 (abnormal) and her pulse is 68. Her oxygen saturation is 96%.   General: Alert  , in no acute distress, in WC HEENT: Head is normocephalic. Extraocular movements are intact. Oropharynx is clear. Neck: Neck is supple, no palpable  cervical or supraclavicular lymphadenopathy. Heart: Regular in rate and rhythm with no rubs or gallops. Systolic murmur that is loudest in the tricuspid and mitral valve.   Chest: Clear to auscultation bilaterally, with no rhonchi, wheezes, or rales. Abdomen: Soft, nontender, nondistended, with no rigidity or guarding. Extremities: No cyanosis or edema. No calf tenderness. Strength is grossly intact through her extremities.  Lymphatics: see Neck Exam Skin: No concerning lesions. Musculoskeletal: symmetric strength and muscle tone throughout. Neurologic: Cranial nerves II through XII are grossly intact.  When the patient was prompted to state the current year, day and month, she was only able to state the year. She was also able to state the city she  currently lives in. Psychiatric: Pleasant affect Right Breast: Tumor at 12 o'clock region involving the areola which is erythematous involving the dermis with impending skin alteration. A lot of surrounding firmness with a palpable mass in the axilla on the right that is at least 2 cm in dimension. Left Breast: 1 o'clock  lumpectomy scar in the left breast with no nodularly underneath. No palpable masses in the left breast or the left axilla.  ECOG = 3  0 - Asymptomatic (Fully active, able to carry on all predisease activities without restriction)  1 - Symptomatic but completely ambulatory (Restricted in physically strenuous activity but ambulatory and able to carry out work of a light or sedentary nature. For example, light housework, office work)  2 - Symptomatic, <50% in bed during the day (Ambulatory and capable of all self care but unable to carry out any work activities. Up and about more than 50% of waking hours)  3 - Symptomatic, >50% in bed, but not bedbound (Capable of only limited self-care, confined to bed or chair 50% or more of waking hours)  4 - Bedbound (Completely disabled. Cannot carry on any self-care. Totally confined to bed or chair)  5 - Death   Eustace Pen MM, Creech RH, Tormey DC, et al. [phone]). "Toxicity and response criteria of the [facility]". [place] Oncol. 5 (6): 649-55   LABORATORY DATA:  Lab Results  Component Value Date   WBC 6.5 01/14/2017   HGB 9.3 (A) 01/14/2017   HCT 28 (A) 01/14/2017   MCV 86.4 08/26/2016   PLT 197 01/14/2017   CMP     Component Value Date/Time   NA 139 12/26/2016   NA 144 10/19/2014 0824   K 4.2 12/26/2016   K 3.9 10/19/2014 0824   CL 105 08/26/2016 0427   CO2 21 (L) 08/26/2016 0427   CO2 23 10/19/2014 0824   GLUCOSE 137 (H) 08/26/2016 0427   GLUCOSE 124 10/19/2014 0824   BUN 34 (A) 12/26/2016   BUN 33.2 (H) 10/19/2014 0824   CREATININE 2.6 (A) 12/26/2016   CREATININE 2.90 (H) 08/26/2016 0427    CREATININE 2.7 (H) 10/19/2014 0824   CALCIUM 8.2 (L) 08/26/2016 0427   CALCIUM 9.2 10/19/2014 0824   PROT 7.1 08/24/2016 1053   PROT 6.3 (L) 10/19/2014 0824   ALBUMIN 3.8 08/24/2016 1053   ALBUMIN 3.5 10/19/2014 0824   AST 12 (A) 12/10/2016   AST 15 10/19/2014 0824   ALT 5 (A) 12/10/2016   ALT 8 10/19/2014 0824   ALKPHOS 37 12/10/2016   ALKPHOS 48 10/19/2014 0824   BILITOT 0.9 08/24/2016 1053   BILITOT 0.72 10/19/2014 0824   GFRNONAA 13 (L) 08/26/2016 0427   GFRAA 16 (L) 08/26/2016 0427         RADIOGRAPHY images  reviewed by me: Nm Pet Image Initial (pi) Skull Base To Thigh  Result Date: 01/10/2017 CLINICAL DATA:  Initial treatment strategy for right breast cancer. History of left breast cancer in 2012. EXAM: NUCLEAR MEDICINE PET SKULL BASE TO THIGH TECHNIQUE: 7.1 mCi F-18 FDG was injected intravenously. Full-ring PET imaging was performed from the skull base to thigh after the radiotracer. CT data was obtained and used for attenuation correction and anatomic localization. FASTING BLOOD GLUCOSE:  Value: 92 mg/dl COMPARISON:  CT abdomen 11/15/2010 FINDINGS: NECK No hypermetabolic lymph nodes in the neck. Intracranial chronic ischemic microvascular white matter disease. Chronic left maxillary sinusitis. CHEST 4.7 by 4.5 cm right subareolar mass with maximum SUV 23.6. There are 2 hypermetabolic right axillary lymph nodes. The more posterior node measures 1.2 cm in short axis on image 59/4 with a maximum SUV of 11.5. The more anterior lymph node measures 1.2 cm in short axis on image 61/4 with maximum SUV 9.2. Trace left pleural effusion. Coronary, aortic arch, and branch vessel atherosclerotic vascular disease. Mild cardiomegaly. Mild biapical pleuroparenchymal scarring. ABDOMEN/PELVIS No abnormal hypermetabolic activity within the liver, pancreas, adrenal glands, or spleen. No hypermetabolic lymph nodes in the abdomen or pelvis. Physiologic activity in bowel, with focally accentuated activity  near the sigmoid anastomotic site where the maximum SUV is 13.1. I suspect that this is likely physiologic. Trace amount of gas in the urinary bladder, query recent catheterization. Aortoiliac atherosclerotic vascular disease. SKELETON Degenerative activity along the glenohumeral joints. No current findings of active osseous malignancy. IMPRESSION: 1. Right subareolar mass measures 4.7 cm in long axis with maximum SUV 23.6 compatible with malignancy. There are 2 hypermetabolic mildly enlarged right axillary lymph nodes but no other metastatic lesions are identified. 2. Other imaging findings of potential clinical significance: Aortic Atherosclerosis (ICD10-I70.0). Coronary atherosclerosis. Mild cardiomegaly. Trace left pleural effusion. Intracranial chronic ischemic microvascular white matter disease. Electronically Signed   By: Van Clines M.D.   On: 01/10/2017 12:02      IMPRESSION/PLAN: Stage T3N1M0, Right Breast, 12 o'clock, Invasive Ductal Carcinoma, ER (NEG)  / PR (NEG) / Her2 (NEG), Grade 2   It was a pleasure meeting the patient today. We discussed the risks, benefits, and side effects of radiotherapy. I recommend radiotherapy to the right breast and axilla with palliative intent.  We discussed that radiation would take approximately 1 mo to complete and that I would give the patient a few weeks to heal following surgery before starting treatment planning. If chemotherapy were to be given, this would precede radiotherapy. We spoke about acute effects including skin irritation and fatigue as well as much less common late effects including internal organ injury or irritation. We spoke about the latest technology that is used to minimize the risk of late effects for patients undergoing radiotherapy to the breast or chest wall. No guarantees of treatment were given. The patient is enthusiastic about proceeding with treatment. I look forward to participating in the patient's care. The patient is  scheduled for CT simulation/treatment planning today and will begin treatment therapy in about a week. We anticipate approximately 21 treatments.  I spoke with her daughter by phone who was preoccupied at work.  She verbally said she was agreeable to RT for her mother but did not have time to hear the complete consent session. I encouraged her to call me later to discuss her mother's care.  Caregiver from [facility] was present. Patient signed consent today.  __________________________________________   Eppie Gibson, MD  This document  serves as a record of services personally performed by Eppie Gibson, MD. It was created on her behalf by Valeta Harms, a trained medical scribe. The creation of this record is based on the scribe's personal observations and the provider's statements to them. This document has been checked and approved by the attending provider.

## 2017-02-06 ENCOUNTER — Ambulatory Visit: Payer: Medicare Other | Admitting: Radiation Oncology

## 2017-02-06 ENCOUNTER — Telehealth: Payer: Self-pay | Admitting: Genetic Counselor

## 2017-02-06 NOTE — Telephone Encounter (Signed)
02/06/17 called and left message with patient informing them of their appointment with [name] for Genetic counseling on 03/10/17 @ 11:00 am.  Patient is aware to arrive 30 minutes prior to appointment to register.

## 2017-02-07 ENCOUNTER — Encounter: Payer: Self-pay | Admitting: Radiation Oncology

## 2017-02-10 DIAGNOSIS — D0511 Intraductal carcinoma in situ of right breast: Secondary | ICD-10-CM | POA: Diagnosis not present

## 2017-02-10 DIAGNOSIS — Z510 Encounter for antineoplastic radiation therapy: Secondary | ICD-10-CM | POA: Diagnosis not present

## 2017-02-13 ENCOUNTER — Ambulatory Visit
Admission: RE | Admit: 2017-02-13 | Discharge: 2017-02-13 | Disposition: A | Discharge status: Home / Self Care | Payer: Medicare Other | Source: Ambulatory Visit | Attending: Radiation Oncology | Admitting: Radiation Oncology

## 2017-02-13 DIAGNOSIS — Z510 Encounter for antineoplastic radiation therapy: Secondary | ICD-10-CM | POA: Diagnosis not present

## 2017-02-13 DIAGNOSIS — D0511 Intraductal carcinoma in situ of right breast: Secondary | ICD-10-CM | POA: Diagnosis not present

## 2017-02-14 ENCOUNTER — Ambulatory Visit
Admission: RE | Admit: 2017-02-14 | Discharge: 2017-02-14 | Disposition: A | Discharge status: Home / Self Care | Payer: Medicare Other | Source: Ambulatory Visit | Attending: Radiation Oncology | Admitting: Radiation Oncology

## 2017-02-14 DIAGNOSIS — D0511 Intraductal carcinoma in situ of right breast: Secondary | ICD-10-CM | POA: Diagnosis not present

## 2017-02-14 DIAGNOSIS — Z510 Encounter for antineoplastic radiation therapy: Secondary | ICD-10-CM | POA: Diagnosis not present

## 2017-02-17 ENCOUNTER — Ambulatory Visit
Admission: RE | Admit: 2017-02-17 | Discharge: 2017-02-17 | Disposition: A | Discharge status: Home / Self Care | Payer: Medicare Other | Source: Ambulatory Visit | Attending: Radiation Oncology | Admitting: Radiation Oncology

## 2017-02-17 DIAGNOSIS — Z510 Encounter for antineoplastic radiation therapy: Secondary | ICD-10-CM | POA: Diagnosis not present

## 2017-02-17 DIAGNOSIS — C50412 Malignant neoplasm of upper-outer quadrant of left female breast: Secondary | ICD-10-CM

## 2017-02-17 DIAGNOSIS — Z171 Estrogen receptor negative status [ER-]: Principal | ICD-10-CM

## 2017-02-17 DIAGNOSIS — D0511 Intraductal carcinoma in situ of right breast: Secondary | ICD-10-CM | POA: Diagnosis not present

## 2017-02-17 MED ORDER — ALRA NON-METALLIC DEODORANT (RAD-ONC)
1.0000 "application " | Freq: Once | TOPICAL | Status: AC
  Administered 2017-02-17: 1 via TOPICAL

## 2017-02-17 MED ORDER — RADIAPLEXRX EX GEL
Freq: Once | CUTANEOUS | Status: AC
  Administered 2017-02-17: 13:00:00 via TOPICAL

## 2017-02-17 NOTE — Progress Notes (Signed)

## 2017-02-19 ENCOUNTER — Ambulatory Visit
Admission: RE | Admit: 2017-02-19 | Discharge: 2017-02-19 | Disposition: A | Discharge status: Home / Self Care | Payer: Medicare Other | Source: Ambulatory Visit | Attending: Radiation Oncology | Admitting: Radiation Oncology

## 2017-02-19 DIAGNOSIS — Z510 Encounter for antineoplastic radiation therapy: Secondary | ICD-10-CM | POA: Diagnosis not present

## 2017-02-19 DIAGNOSIS — D0511 Intraductal carcinoma in situ of right breast: Secondary | ICD-10-CM | POA: Diagnosis not present

## 2017-02-20 ENCOUNTER — Ambulatory Visit
Admission: RE | Admit: 2017-02-20 | Discharge: 2017-02-20 | Disposition: A | Discharge status: Home / Self Care | Payer: Medicare Other | Source: Ambulatory Visit | Attending: Radiation Oncology | Admitting: Radiation Oncology

## 2017-02-20 DIAGNOSIS — D0511 Intraductal carcinoma in situ of right breast: Secondary | ICD-10-CM | POA: Diagnosis not present

## 2017-02-20 DIAGNOSIS — Z510 Encounter for antineoplastic radiation therapy: Secondary | ICD-10-CM | POA: Diagnosis not present

## 2017-02-21 ENCOUNTER — Ambulatory Visit
Admission: RE | Admit: 2017-02-21 | Discharge: 2017-02-21 | Disposition: A | Discharge status: Home / Self Care | Payer: Medicare Other | Source: Ambulatory Visit | Attending: Radiation Oncology | Admitting: Radiation Oncology

## 2017-02-21 DIAGNOSIS — D0511 Intraductal carcinoma in situ of right breast: Secondary | ICD-10-CM | POA: Diagnosis not present

## 2017-02-21 DIAGNOSIS — Z510 Encounter for antineoplastic radiation therapy: Secondary | ICD-10-CM | POA: Diagnosis not present

## 2017-02-24 ENCOUNTER — Ambulatory Visit
Admission: RE | Admit: 2017-02-24 | Discharge: 2017-02-24 | Disposition: A | Discharge status: Home / Self Care | Payer: Medicare Other | Source: Ambulatory Visit | Attending: Radiation Oncology | Admitting: Radiation Oncology

## 2017-02-24 DIAGNOSIS — Z510 Encounter for antineoplastic radiation therapy: Secondary | ICD-10-CM | POA: Diagnosis not present

## 2017-02-24 DIAGNOSIS — D0511 Intraductal carcinoma in situ of right breast: Secondary | ICD-10-CM | POA: Diagnosis not present

## 2017-02-25 ENCOUNTER — Ambulatory Visit: Payer: Medicare Other

## 2017-02-25 ENCOUNTER — Ambulatory Visit
Admission: RE | Admit: 2017-02-25 | Discharge: 2017-02-25 | Disposition: A | Discharge status: Home / Self Care | Payer: Medicare Other | Source: Ambulatory Visit | Attending: Radiation Oncology | Admitting: Radiation Oncology

## 2017-02-25 DIAGNOSIS — Z510 Encounter for antineoplastic radiation therapy: Secondary | ICD-10-CM | POA: Diagnosis not present

## 2017-02-25 DIAGNOSIS — D0511 Intraductal carcinoma in situ of right breast: Secondary | ICD-10-CM | POA: Diagnosis not present

## 2017-02-26 ENCOUNTER — Ambulatory Visit
Admission: RE | Admit: 2017-02-26 | Discharge: 2017-02-26 | Disposition: A | Discharge status: Home / Self Care | Payer: Medicare Other | Source: Ambulatory Visit | Attending: Radiation Oncology | Admitting: Radiation Oncology

## 2017-02-26 ENCOUNTER — Ambulatory Visit: Payer: Medicare Other

## 2017-02-26 DIAGNOSIS — D0511 Intraductal carcinoma in situ of right breast: Secondary | ICD-10-CM | POA: Diagnosis not present

## 2017-02-26 DIAGNOSIS — Z510 Encounter for antineoplastic radiation therapy: Secondary | ICD-10-CM | POA: Diagnosis not present

## 2017-02-27 ENCOUNTER — Ambulatory Visit
Admission: RE | Admit: 2017-02-27 | Discharge: 2017-02-27 | Disposition: A | Discharge status: Home / Self Care | Payer: Medicare Other | Source: Ambulatory Visit | Attending: Radiation Oncology | Admitting: Radiation Oncology

## 2017-02-27 DIAGNOSIS — Z510 Encounter for antineoplastic radiation therapy: Secondary | ICD-10-CM | POA: Diagnosis not present

## 2017-02-27 DIAGNOSIS — D0511 Intraductal carcinoma in situ of right breast: Secondary | ICD-10-CM | POA: Diagnosis not present

## 2017-02-28 ENCOUNTER — Ambulatory Visit
Admission: RE | Admit: 2017-02-28 | Discharge: 2017-02-28 | Disposition: A | Discharge status: Home / Self Care | Payer: Medicare Other | Source: Ambulatory Visit | Attending: Radiation Oncology | Admitting: Radiation Oncology

## 2017-02-28 DIAGNOSIS — D0511 Intraductal carcinoma in situ of right breast: Secondary | ICD-10-CM | POA: Diagnosis not present

## 2017-02-28 DIAGNOSIS — Z510 Encounter for antineoplastic radiation therapy: Secondary | ICD-10-CM | POA: Diagnosis not present

## 2017-03-03 ENCOUNTER — Ambulatory Visit
Admission: RE | Admit: 2017-03-03 | Discharge: 2017-03-03 | Disposition: A | Discharge status: Home / Self Care | Payer: Medicare Other | Source: Ambulatory Visit | Attending: Radiation Oncology | Admitting: Radiation Oncology

## 2017-03-03 DIAGNOSIS — Z510 Encounter for antineoplastic radiation therapy: Secondary | ICD-10-CM | POA: Diagnosis not present

## 2017-03-03 DIAGNOSIS — D0511 Intraductal carcinoma in situ of right breast: Secondary | ICD-10-CM | POA: Diagnosis not present

## 2017-03-04 ENCOUNTER — Ambulatory Visit
Admission: RE | Admit: 2017-03-04 | Discharge: 2017-03-04 | Disposition: A | Discharge status: Home / Self Care | Payer: Medicare Other | Source: Ambulatory Visit | Attending: Radiation Oncology | Admitting: Radiation Oncology

## 2017-03-04 DIAGNOSIS — D0511 Intraductal carcinoma in situ of right breast: Secondary | ICD-10-CM | POA: Diagnosis not present

## 2017-03-04 DIAGNOSIS — Z510 Encounter for antineoplastic radiation therapy: Secondary | ICD-10-CM | POA: Diagnosis not present

## 2017-03-05 ENCOUNTER — Ambulatory Visit
Admission: RE | Admit: 2017-03-05 | Discharge: 2017-03-05 | Disposition: A | Discharge status: Home / Self Care | Payer: Medicare Other | Source: Ambulatory Visit | Attending: Radiation Oncology | Admitting: Radiation Oncology

## 2017-03-05 DIAGNOSIS — Z510 Encounter for antineoplastic radiation therapy: Secondary | ICD-10-CM | POA: Diagnosis not present

## 2017-03-05 DIAGNOSIS — D0511 Intraductal carcinoma in situ of right breast: Secondary | ICD-10-CM | POA: Diagnosis not present

## 2017-03-06 ENCOUNTER — Ambulatory Visit
Admission: RE | Admit: 2017-03-06 | Discharge: 2017-03-06 | Disposition: A | Discharge status: Home / Self Care | Payer: Medicare Other | Source: Ambulatory Visit | Attending: Radiation Oncology | Admitting: Radiation Oncology

## 2017-03-06 DIAGNOSIS — D0511 Intraductal carcinoma in situ of right breast: Secondary | ICD-10-CM | POA: Diagnosis not present

## 2017-03-06 DIAGNOSIS — Z510 Encounter for antineoplastic radiation therapy: Secondary | ICD-10-CM | POA: Diagnosis not present

## 2017-03-07 ENCOUNTER — Non-Acute Institutional Stay (SKILLED_NURSING_FACILITY): Payer: Medicare Other | Admitting: Nurse Practitioner

## 2017-03-07 ENCOUNTER — Encounter: Payer: Self-pay | Admitting: Nurse Practitioner

## 2017-03-07 ENCOUNTER — Ambulatory Visit
Admission: RE | Admit: 2017-03-07 | Discharge: 2017-03-07 | Disposition: A | Discharge status: Home / Self Care | Payer: Medicare Other | Source: Ambulatory Visit | Attending: Radiation Oncology | Admitting: Radiation Oncology

## 2017-03-07 DIAGNOSIS — G8929 Other chronic pain: Secondary | ICD-10-CM

## 2017-03-07 DIAGNOSIS — Z510 Encounter for antineoplastic radiation therapy: Secondary | ICD-10-CM | POA: Diagnosis not present

## 2017-03-07 DIAGNOSIS — C50111 Malignant neoplasm of central portion of right female breast: Secondary | ICD-10-CM | POA: Diagnosis not present

## 2017-03-07 DIAGNOSIS — I5032 Chronic diastolic (congestive) heart failure: Secondary | ICD-10-CM | POA: Diagnosis not present

## 2017-03-07 DIAGNOSIS — D519 Vitamin B12 deficiency anemia, unspecified: Secondary | ICD-10-CM | POA: Diagnosis not present

## 2017-03-07 DIAGNOSIS — M25561 Pain in right knee: Secondary | ICD-10-CM | POA: Diagnosis not present

## 2017-03-07 DIAGNOSIS — D0511 Intraductal carcinoma in situ of right breast: Secondary | ICD-10-CM | POA: Diagnosis not present

## 2017-03-07 DIAGNOSIS — I10 Essential (primary) hypertension: Secondary | ICD-10-CM | POA: Diagnosis not present

## 2017-03-07 NOTE — Assessment & Plan Note (Signed)
Knee pain is managed, continue Tylneol 650mg  bid and wheelchair.

## 2017-03-07 NOTE — Assessment & Plan Note (Addendum)
The patient is under daily radiation treatment for breast cancer. 02/05/17 Radiotherapy M-F, 22 days.

## 2017-03-07 NOTE — Progress Notes (Signed)
Location:  [facility] Room Number: 5 Place of Service:  SNF (31) Provider:  Damesha Lawler, [place]  NP  [name], MD  Patient Care Team: [name], MD as PCP - General (Internal Medicine) Juanita Craver, MD as Consulting Physician (Gastroenterology) Shon Hough, MD as Consulting Physician (Ophthalmology) Thornell Sartorius, MD as Consulting Physician (Otolaryngology) [place] Lyn Deemer X, NP as Nurse Practitioner (Nurse Practitioner) Bo Merino, MD as Consulting Physician (Rheumatology) Suella Broad, MD as Consulting Physician (Physical Medicine and Rehabilitation) Netta Cedars, MD as Consulting Physician (Orthopedic Surgery) Fanny Skates, MD as Consulting Physician (General Surgery) Truitt Merle, MD as Consulting Physician (Hematology) Thea Silversmith, MD (Inactive) as Consulting Physician (Radiation Oncology) Rockwell Germany, RN as Registered Nurse Mauro Kaufmann, RN as Registered Nurse Jake Shark, Johny Blamer, NP as Nurse Practitioner (Nurse Practitioner) [name], MD as Consulting Physician (Cardiology)  Extended Emergency Contact Information Primary Emergency Contact: [name],[name] Address: [address]          [place] [address] [place] Phone: [phone] Mobile Phone: [phone] Relation: Daughter Secondary Emergency Contact: [name],[name]  [place] Mobile Phone: [phone] Relation: None  Code Status:  Full Code Goals of care: Advanced Directive information Advanced Directives 03/07/2017  Does Patient Have a Medical Advance Directive? Yes  Type of Advance Directive Out of facility DNR (pink MOST or yellow form)  Does patient want to make changes to medical advance directive? No - Patient declined  Copy of [place] in Chart? -  Pre-existing out of facility DNR order (yellow form or pink MOST form) Yellow form placed in chart (order  not valid for inpatient use);Pink MOST form placed in chart (order not valid for inpatient use)     Chief Complaint  Patient presents with  . Medical Management of Chronic Issues    F/u- hypertensive heart    HPI:  Pt is a 84 y.o. female seen today for medical management of chronic diseases.     The patient has history of anemia, taking Vit B12 [id number] dailiy, Folic acid 1mg  daily, CHF, compensated on Torsemide 10mg  daily, CAD/HTN, stable, on Isosorbide 90mg , Clonidine 0.1mg  bid, Carvedilol 12.5mg  bid, Amlodipine 2.5mg  adily. Knee pain is managed with Tylneol 650mg  bid and wheelchair.   The patient is under daily radiation treatment for breast cancer.   Past Medical History:  Diagnosis Date  . Arthritis    Osteoarthritis  . Cancer of left breast [facility]) August 2016  . Cerebral atherosclerosis 12/19/2012  . Cerebral atrophy 12/19/2012  . Cerebral ischemia 12/19/2012   Microvascular   . CKD (chronic kidney disease) stage 3, GFR 30-59 ml/min (HCC) 11/03/2014  . Cough   . Dementia   . Diverticulosis of colon (without mention of hemorrhage)   . Edema   . Family history of breast cancer   . Flat feet   . GERD (gastroesophageal reflux disease)   . Glaucoma   . Heart murmur   . Hyperlipidemia   . Hypertension   . Hypertonicity of bladder   . Ischemic cardiomyopathy 11/04/2014  . Lumbago   . Memory change 03/28/2016  . Osteoarthrosis, unspecified whether generalized or localized, unspecified site   . Other malaise and fatigue   . Pain in joint, pelvic region and thigh   . Pain in joint, shoulder region   . Personal history of fall   . RLQ abdominal pain 01/17/2011  . Senile osteoporosis   . Small bowel obstruction ([place])   .  STEMI (ST elevation myocardial infarction) ([place]) 11/04/2014  . Unstable gait 11/04/2014  . Urine, incontinence, stress female 09/18/2014   Past Surgical History:  Procedure Laterality Date  . BREAST LUMPECTOMY Bilateral   . BREAST LUMPECTOMY WITH RADIOACTIVE  SEED LOCALIZATION Left 03/15/2015   Procedure: LEFT BREAST LUMPECTOMY WITH RADIOACTIVE SEED LOCALIZATION;  Surgeon: Fanny Skates, MD;  Location: [facility];  Service: General;  Laterality: Left;  . CARDIAC CATHETERIZATION N/A 11/08/2014   Procedure: Left Heart Cath and Coronary Angiography;  Surgeon: [name], MD;  Location: [facility] CV LAB;  Service: Cardiovascular;  Laterality: N/A;  . COLON SURGERY  02/1996   Colectomy, partial for diverticular bleed 90%  . EYE SURGERY    . JOINT REPLACEMENT Left 12/2000   Left knee; Dr. Wynelle Link  . LAPAROSCOPIC LYSIS OF ADHESIONS  11/15/10   Exploratory  . TONSILLECTOMY      Allergies  Allergen Reactions  . Sulfa Antibiotics Itching    Outpatient Encounter Medications as of 03/07/2017  Medication Sig  . acetaminophen (TYLENOL) 325 MG tablet Take 650 mg by mouth 2 (two) times daily. Take 2 tablets every 6 hours as needed for mild pain or fever  . alum & mag hydroxide-simeth (MAALOX/MYLANTA) 200-200-20 MG/5ML suspension Take 30 mLs by mouth every 4 (four) hours as needed for indigestion.   Marland Kitchen amLODipine (NORVASC) 2.5 MG tablet Take 2.5 mg by mouth daily.  Marland Kitchen atorvastatin (LIPITOR) 10 MG tablet Take 10 mg by mouth daily.  Marland Kitchen BETIMOL 0.5 % ophthalmic solution Place 1 drop daily into both eyes.   . carvedilol (COREG) 12.5 MG tablet Take 12.5 mg by mouth 2 (two) times daily with a meal.  . cloNIDine (CATAPRES) 0.1 MG tablet Take 0.1 mg by mouth 2 (two) times daily.  . Coenzyme Q10 50 MG TABS Take one tablet by mouth at bedtime  . famotidine (PEPCID) 20 MG tablet Take 20 mg by mouth at bedtime.   . folic acid (FOLVITE) 1 MG tablet Take 1 mg by mouth daily.  . Hypromellose (ARTIFICIAL TEARS OP) Place 2 drops 2 (two) times daily into both eyes.   . isosorbide mononitrate (IMDUR) 60 MG 24 hr tablet Take 90 mg by mouth at bedtime.   Marland Kitchen KLOR-CON 10 10 MEQ tablet Take 10 mEq by mouth daily.  Marland Kitchen latanoprost (XALATAN) 0.005 % ophthalmic solution Place 1 drop into  both eyes at bedtime.   . torsemide (DEMADEX) 10 MG tablet Take 10 mg daily by mouth. Hold for SBP < 110  . vitamin B-12 (CYANOCOBALAMIN) 1000 MCG tablet Take 1,000 mcg See admin instructions by mouth. Take 1000 mcg by mouth daily On Mondays,Wednesdays and Fridays   No facility-administered encounter medications on file as of 03/07/2017.     Review of Systems  Constitutional: Positive for appetite change and fatigue. Negative for activity change, chills, diaphoresis and fever.  HENT: Positive for hearing loss. Negative for congestion, trouble swallowing and voice change.   Eyes: Negative for visual disturbance.  Respiratory: Negative for cough, chest tightness, shortness of breath and wheezing.   Cardiovascular: Negative for chest pain, palpitations and leg swelling.  Gastrointestinal: Negative for abdominal distention, abdominal pain, constipation, diarrhea, nausea and vomiting.  Genitourinary: Negative for difficulty urinating, dysuria, frequency and urgency.       Urinary leakage sometimes  Musculoskeletal: Positive for arthralgias and gait problem. Negative for joint swelling.  Skin: Positive for pallor. Negative for color change.  Neurological: Negative for tremors, speech difficulty, weakness and headaches.  Psychiatric/Behavioral: Positive  for confusion. Negative for agitation, behavioral problems, hallucinations and sleep disturbance. The patient is not nervous/anxious.     Immunization History  Administered Date(s) Administered  . Influenza Whole 11/19/2011, 12/02/2012  . Influenza-Unspecified 12/06/2013, 12/06/2015, 12/02/2016  . PPD Test 11/10/2014  . Pneumococcal Conjugate-13 03/09/2009  . Pneumococcal Polysaccharide-23 02/18/2009  . Tdap 04/02/2016   Pertinent  Health Maintenance Due  Topic Date Due  . MAMMOGRAM  11/26/2017  . INFLUENZA VACCINE  Completed  . DEXA SCAN  Completed  . PNA vac Low Risk Adult  Completed   Fall Risk  02/05/2017 01/31/2017 07/13/2015  11/17/2014 04/21/2014  Falls in the past year? No No No Yes Yes  Number falls in past yr: - - - 1 1  Injury with Fall? - - - No No  Comment - - - - -  Risk for fall due to : - History of fall(s);Impaired balance/gait;Impaired vision - Impaired balance/gait;History of fall(s) -   Functional Status Survey:    Vitals:   03/07/17 1116  BP: 120/68  Pulse: 64  Resp: 18  Temp: (!) 97.4 F (36.3 C)  Weight: 146 lb 11.2 oz (66.5 kg)  Height: 5\' 6"  (1.676 m)   Body mass index is 23.68 kg/m. Physical Exam  Constitutional: She appears well-developed and well-nourished. No distress.  HENT:  Head: Normocephalic and atraumatic.  Eyes: Conjunctivae and EOM are normal. Pupils are equal, round, and reactive to light.  Neck: Normal range of motion. Neck supple.  Cardiovascular: Normal rate and regular rhythm.  Murmur heard. Pulmonary/Chest: Effort normal and breath sounds normal. She has no wheezes. She has no rales.  Abdominal: Soft. Bowel sounds are normal. She exhibits no distension. There is no tenderness.  Musculoskeletal: Normal range of motion. She exhibits tenderness. She exhibits no edema.  Right knee pain when standing up  Neurological: She is alert. She exhibits normal muscle tone. Coordination normal.  Oriented to person and place.   Skin: Skin is warm and dry. No rash noted. She is not diaphoretic. No erythema.  Psychiatric: She has a normal mood and affect. Her behavior is normal.    Labs reviewed: Recent Labs    08/24/16 1053 08/25/16 0105 08/26/16 0427  12/10/16 12/26/16 03/11/17  NA 140 138 138   < > 138 139 138  K 3.8 3.4* 4.0   < > 4.2 4.2 4.0  CL 109 106 105  --   --   --   --   CO2 21* 22 21*  --   --   --   --   GLUCOSE 127* 161* 137*  --   --   --   --   BUN 35* 41* 64*   < > 37* 34* 33*  CREATININE 2.04* 2.20* 2.90*   < > 2.4* 2.6* 2.3*  CALCIUM 8.8* 8.5* 8.2*  --   --   --   --   MG  --  1.9  --   --   --   --   --    < > = values in this interval not  displayed.   Recent Labs    08/24/16 1053 12/10/16 03/11/17  AST 17 12* 12*  ALT 9* 5* 4*  ALKPHOS 42 37 39  BILITOT 0.9  --   --   PROT 7.1  --   --   ALBUMIN 3.8  --   --    Recent Labs    08/24/16 1053 08/25/16 0105 08/26/16 0427  12/26/16 01/14/17  03/11/17  WBC 10.8* 19.8* 12.9*   < > 7.8 6.5 6.4  NEUTROABS 8.2*  --   --   --   --   --   --   HGB 12.2 11.1* 10.0*   < > 11.1* 9.3* 9.8*  HCT 36.3 33.0* 30.6*   < > 33* 28* 30*  MCV 89.9 88.2 86.4  --   --   --   --   PLT 186 190 197   < > 228 197 214   < > = values in this interval not displayed.   Lab Results  Component Value Date   TSH 2.21 04/04/2016   Lab Results  Component Value Date   HGBA1C 5.6 11/06/2014   Lab Results  Component Value Date   CHOL 108 11/28/2016   HDL 50 11/28/2016   LDLCALC 41 11/28/2016   TRIG 85 11/28/2016   CHOLHDL 3.1 11/06/2014    Significant Diagnostic Results in last 30 days:  No results found.  Assessment/Plan Anemia The patient has history of anemia, Hgb 9.3 01/14/17, continue Vit B12 1062IRS dailiy, Folic acid 1mg  daily, update CBC.   Chronic diastolic congestive heart failure (HCC) CHF, compensated, continue Torsemide 10mg  daily, update CMP  Accelerated hypertension CAD/HTN, stable, continue Isosorbide 90mg , Clonidine 0.1mg  bid, Carvedilol 12.5mg  bid, Amlodipine 2.5mg  adily.   Right knee pain Knee pain is managed, continue Tylneol 650mg  bid and wheelchair.     Cancer of central portion of right female breast [facility]) The patient is under daily radiation treatment for breast cancer. 02/05/17 Radiotherapy M-F, 22 days.     Family/ staff Communication: plan of care reviewed with the patient and charge nurse.   Labs/tests ordered: none  Time spend 25 minutes.

## 2017-03-07 NOTE — Assessment & Plan Note (Addendum)
The patient has history of anemia, Hgb 9.3 01/14/17, continue Vit B12 [id number] dailiy, Folic acid 1mg  daily, update CBC.

## 2017-03-07 NOTE — Assessment & Plan Note (Signed)
CHF, compensated, continue Torsemide 10mg  daily, update CMP

## 2017-03-07 NOTE — Assessment & Plan Note (Signed)
CAD/HTN, stable, continue Isosorbide 90mg , Clonidine 0.1mg  bid, Carvedilol 12.5mg  bid, Amlodipine 2.5mg  adily.

## 2017-03-10 ENCOUNTER — Inpatient Hospital Stay: Payer: Medicare Other

## 2017-03-10 ENCOUNTER — Inpatient Hospital Stay: Payer: Medicare Other | Attending: Genetic Counselor | Admitting: Genetic Counselor

## 2017-03-10 ENCOUNTER — Ambulatory Visit
Admission: RE | Admit: 2017-03-10 | Discharge: 2017-03-10 | Disposition: A | Discharge status: Home / Self Care | Payer: Medicare Other | Source: Ambulatory Visit | Attending: Radiation Oncology | Admitting: Radiation Oncology

## 2017-03-10 ENCOUNTER — Encounter: Payer: Self-pay | Admitting: Genetic Counselor

## 2017-03-10 DIAGNOSIS — C50111 Malignant neoplasm of central portion of right female breast: Secondary | ICD-10-CM | POA: Diagnosis not present

## 2017-03-10 DIAGNOSIS — D0511 Intraductal carcinoma in situ of right breast: Secondary | ICD-10-CM | POA: Diagnosis not present

## 2017-03-10 DIAGNOSIS — Z1509 Genetic susceptibility to other malignant neoplasm: Secondary | ICD-10-CM | POA: Diagnosis not present

## 2017-03-10 DIAGNOSIS — Z510 Encounter for antineoplastic radiation therapy: Secondary | ICD-10-CM | POA: Diagnosis not present

## 2017-03-10 DIAGNOSIS — C50412 Malignant neoplasm of upper-outer quadrant of left female breast: Secondary | ICD-10-CM

## 2017-03-10 DIAGNOSIS — Z803 Family history of malignant neoplasm of breast: Secondary | ICD-10-CM

## 2017-03-10 DIAGNOSIS — Z171 Estrogen receptor negative status [ER-]: Secondary | ICD-10-CM

## 2017-03-10 NOTE — Progress Notes (Signed)
REFERRING PROVIDER: Truitt Merle, MD [place], [place] [address]  PRIMARY PROVIDER:  [name], MD  PRIMARY REASON FOR VISIT:  1. Malignant neoplasm of central portion of right female breast, unspecified estrogen receptor status ([place])   2. Family history of breast cancer      HISTORY OF PRESENT ILLNESS:   [name], a 84 y.o. female, was seen for a [place] cancer genetics consultation at the request of Dr. Burr Medico due to a personal and family history of cancer.  [name] presents to clinic today to discuss the possibility of a hereditary predisposition to cancer, genetic testing, and to further clarify her future cancer risks, as well as potential cancer risks for family members.   In 2017, at the age of 78, Ms. [name] was diagnosed with invasive ductal carcinoma of the left breast. She was again diagnosed with breast cancer in 11/2016 at 78, in her right breast.  She is currently undergoing radiation treatment.Marland Kitchen    CANCER HISTORY:  Oncology History   Breast cancer of upper-outer quadrant of left female breast   Staging form: Breast, AJCC 7th Edition     Clinical: Stage 0 (Tis (DCIS), N0, M0) - Unsigned       Breast cancer of upper-outer quadrant of left female breast ([place])   09/23/2014 Mammogram    Left breast: new cluster of calcifications in the left breast is indeterminate. Spot magnification views are recommended      10/13/2014 Initial Biopsy    Left breast biopsy: DCIS, high-grade, with calcification and necrosis. ER- (0%), PR- (0%)      10/13/2014 Clinical Stage    Stage 0: Tis N0      03/15/2015 Definitive Surgery    Left lumpectomy: DCIS, high grade, with comedonecrosis and calcifications, spanning 1.5 cm, ALH, ER- (0%), PR- (0%).      03/15/2015 Pathologic Stage    Stage 0: Tis N0  Diagnosis 03/14/16 Breast, lumpectomy, Left - HIGH GRADE DUCTAL CARCINOMA IN SITU WITH COMEDONECROSIS AND CALCIFICATIONS, SPANNING APPROXIMATELY 1.5 CM IN  GREATEST DIMENSION. - ATYPICAL LOBULAR HYPERPLASIA. - DUCTAL CARCINOMA IN SITU IS LESS THAN 0.2 CM IN SEVERAL AREAS TO THE ANTERIOR MARGIN. 1 of 3      03/29/2015 Survivorship    Survivorship care plan completed and mailed to patient in lieu of in person visit       Cancer of central portion of right female breast ([place])   11/26/2016 Mammogram    Diagnostic Mammogram and [place] 11/26/16  IMPRESSION:  The is a 4.2 cm lobulated mass in the right breast central to the nipple anterior depth is highly suggestive of malignancy. The lymph node in the right axilla is at a moderate concern but not classic for malignancy. An ultrasound guided biopsy is recommended.         11/26/2016 Initial Biopsy    Diagnosis 11/26/16 1. Breast, right, needle core biopsy, 12:00 o'clock - INVASIVE DUCTAL CARCINOMA - SEE COMMENT 2. Lymph node, needle/core biopsy, right axilla - METASTATIC CARCINOMA INVOLVING ONE LYMPH NODE       11/26/2016 Receptors her2    Estrogen Receptor: 0%, NEGATIVE Progesterone Receptor: 0%, NEGATIVE Proliferation Marker Ki67: 20% HER2 - NEGATIVE      11/29/2016 Initial Diagnosis    Infiltrating ductal carcinoma of right breast ([place])      01/10/2017 PET scan    PET 01/10/17  IMPRESSION: 1. Right subareolar mass measures 4.7 cm in long axis with maximum SUV 23.6 compatible with malignancy.  There are 2 hypermetabolic mildly enlarged right axillary lymph nodes but no other metastatic lesions are identified. 2. Other imaging findings of potential clinical significance: Aortic Atherosclerosis (ICD10-I70.0). Coronary atherosclerosis. Mild cardiomegaly. Trace left pleural effusion. Intracranial chronic ischemic microvascular white matter disease.        Past Medical History:  Diagnosis Date  . Arthritis    Osteoarthritis  . Cancer of left breast [facility]) August 2016  . Cerebral atherosclerosis 12/19/2012  . Cerebral atrophy 12/19/2012  . Cerebral ischemia 12/19/2012    Microvascular   . CKD (chronic kidney disease) stage 3, GFR 30-59 ml/min (HCC) 11/03/2014  . Cough   . Dementia   . Diverticulosis of colon (without mention of hemorrhage)   . Edema   . Family history of breast cancer   . Flat feet   . GERD (gastroesophageal reflux disease)   . Glaucoma   . Heart murmur   . Hyperlipidemia   . Hypertension   . Hypertonicity of bladder   . Ischemic cardiomyopathy 11/04/2014  . Lumbago   . Memory change 03/28/2016  . Osteoarthrosis, unspecified whether generalized or localized, unspecified site   . Other malaise and fatigue   . Pain in joint, pelvic region and thigh   . Pain in joint, shoulder region   . Personal history of fall   . RLQ abdominal pain 01/17/2011  . Senile osteoporosis   . Small bowel obstruction ([place])   . STEMI (ST elevation myocardial infarction) ([place]) 11/04/2014  . Unstable gait 11/04/2014  . Urine, incontinence, stress female 09/18/2014    Past Surgical History:  Procedure Laterality Date  . BREAST LUMPECTOMY Bilateral   . BREAST LUMPECTOMY WITH RADIOACTIVE SEED LOCALIZATION Left 03/15/2015   Procedure: LEFT BREAST LUMPECTOMY WITH RADIOACTIVE SEED LOCALIZATION;  Surgeon: Fanny Skates, MD;  Location: [facility];  Service: General;  Laterality: Left;  . CARDIAC CATHETERIZATION N/A 11/08/2014   Procedure: Left Heart Cath and Coronary Angiography;  Surgeon: [name], MD;  Location: [facility] CV LAB;  Service: Cardiovascular;  Laterality: N/A;  . COLON SURGERY  02/1996   Colectomy, partial for diverticular bleed 90%  . EYE SURGERY    . JOINT REPLACEMENT Left 12/2000   Left knee; Dr. Wynelle Link  . LAPAROSCOPIC LYSIS OF ADHESIONS  11/15/10   Exploratory  . TONSILLECTOMY      Social History   Socioeconomic History  . Marital status: Widowed    Spouse name: Not on file  . Number of children: Not on file  . Years of education: Not on file  . Highest education level: Not on file  Social Needs  . Financial resource strain:  Patient refused  . Food insecurity - worry: Patient refused  . Food insecurity - inability: Patient refused  . Transportation needs - medical: Patient refused  . Transportation needs - non-medical: Patient refused  Occupational History  . Not on file  Tobacco Use  . Smoking status: Former Smoker    Packs/day: 0.50    Years: 30.00    Pack years: 15.00    Types: Cigarettes    Last attempt to quit: 12/16/1980    Years since quitting: 36.2  . Smokeless tobacco: Never Used  Substance and Sexual Activity  . Alcohol use: Yes    Alcohol/week: 0.6 oz    Types: 1 Glasses of wine per week    Comment: occasional glass of wine  . Drug use: No  . Sexual activity: No  Other Topics Concern  . Not on file  Social  History Narrative   Lives at [facility] 05/2011   Widowed 2003   Living Will   Walks with cane   Never smoked    Exercise none      FAMILY HISTORY:  We obtained a detailed, 4-generation family history.  Significant diagnoses are listed below: Family History  Problem Relation Age of Onset  . Cancer Father 58       colon  . Cancer Maternal Aunt        breast cancer   . Cancer Cousin        breast cancer     The patient has one adopted daughter who is cancer free.  She has one brother who is cancer free.  Her father died of colon cancer at 51 and her mother died in her 3's.  [name] father had 14 siblings.  She is not aware of specific individuals with cancer.  The grandparents are both deceased.  [name] mother 3-4 sisters and 2 brothers.  One sister had breast cancer.  There may have been others in the family with breast cancer in more extended relatives.  Ms. Milsap is unaware of previous family history of genetic testing for hereditary cancer risks. Patient's maternal ancestors are of Caucasian descent, and paternal ancestors are of [place] descent. There is no reported Ashkenazi Jewish ancestry. There is no known consanguinity.  GENETIC COUNSELING  ASSESSMENT: JOSEFINE FUHR is a 84 y.o. female with a personal and family history  which is somewhat suggestive of a familial cancer and predisposition to cancer. We, therefore, discussed and recommended the following at today's visit.   DISCUSSION: We discussed that about 5-10% of breast cancer is hereditary with most cases due to BRCA mutations.  Other genes associated with hereditary breast cancer syndromes include ATM, CHEK2 and PALB2.  Based on the patient's diagnosis of breast cancer and one other relative that she could tell us about, we discussed with Ms. Crager that the family history is not highly consistent with a familial hereditary cancer syndrome, and we feel she is at low risk to harbor a gene mutation associated with such a condition. This could change if she learned that there are other family members affected.  I am concerned that if there are other family members affected and that Ms. Riles did meet criteria, that she could not provide informed consent for genetic testing.  We would need to have her daughter or other guardian there to consent for testing.  Thus, we did not recommend any genetic testing, at this time, and recommended Ms. Pena continue to follow the cancer screening guidelines given by her primary healthcare provider.   PLAN: Ms. Tsao did not pursue genetic testing at today's visit. She seemed to struggle hearing [place], and/or understanding the information.  I am concerned that she would not be able to provide informed consent for testing.  Therefore, we understand this decision, and remain available to coordinate genetic testing at any time in the future. We; therefore, recommend Ms. Dearman continue to follow the cancer screening guidelines given by her primary healthcare provider.  Lastly, we encouraged Ms. Gleaves to remain in contact with cancer genetics annually so that we can continuously update the family history and inform her of any changes in cancer genetics and  testing that may be of benefit for this family.   Ms.  Dail questions were answered to her satisfaction today. Our contact information was provided should additional questions or concerns arise. Thank you for  the referral and allowing [place] to share in the care of your patient.   Avanell Banwart P. Florene Glen, [place], [facility] Certified Genetic Counselor [email or web address] phone: [phone]  The patient was seen for a total of 60 minutes in face-to-face genetic counseling.  This patient was discussed with Drs. Magrinat, Lindi Adie and/or Burr Medico who agrees with the above.    _______________________________________________________________________ For Office Staff:  Number of people involved in session: 1 Was an Intern/ student involved with case: no

## 2017-03-11 ENCOUNTER — Ambulatory Visit
Admission: RE | Admit: 2017-03-11 | Discharge: 2017-03-11 | Disposition: A | Discharge status: Home / Self Care | Payer: Medicare Other | Source: Ambulatory Visit | Attending: Radiation Oncology | Admitting: Radiation Oncology

## 2017-03-11 DIAGNOSIS — D649 Anemia, unspecified: Secondary | ICD-10-CM | POA: Diagnosis not present

## 2017-03-11 DIAGNOSIS — D0511 Intraductal carcinoma in situ of right breast: Secondary | ICD-10-CM | POA: Diagnosis not present

## 2017-03-11 DIAGNOSIS — Z510 Encounter for antineoplastic radiation therapy: Secondary | ICD-10-CM | POA: Diagnosis not present

## 2017-03-11 LAB — BASIC METABOLIC PANEL
BUN: 33 — AB (ref 4–21)
CREATININE: 2.3 — AB (ref <=1.1)
Glucose: 95
Potassium: 4 (ref 3.4–5.3)
SODIUM: 138 (ref 137–147)

## 2017-03-11 LAB — CBC AND DIFFERENTIAL
HCT: 30 — AB (ref 36–46)
Hemoglobin: 9.8 — AB (ref 12.0–16.0)
Platelets: 214 (ref 150–399)
WBC: 6.4

## 2017-03-11 LAB — HEPATIC FUNCTION PANEL
ALK PHOS: 39 (ref 25–125)
ALT: 4 — AB (ref 7–35)
AST: 12 — AB (ref 13–35)
BILIRUBIN, TOTAL: 0.5

## 2017-03-12 ENCOUNTER — Other Ambulatory Visit: Payer: Self-pay | Admitting: *Deleted

## 2017-03-12 ENCOUNTER — Ambulatory Visit
Admission: RE | Admit: 2017-03-12 | Discharge: 2017-03-12 | Disposition: A | Discharge status: Home / Self Care | Payer: Medicare Other | Source: Ambulatory Visit | Attending: Radiation Oncology | Admitting: Radiation Oncology

## 2017-03-12 DIAGNOSIS — Z510 Encounter for antineoplastic radiation therapy: Secondary | ICD-10-CM | POA: Diagnosis not present

## 2017-03-12 DIAGNOSIS — D0511 Intraductal carcinoma in situ of right breast: Secondary | ICD-10-CM | POA: Diagnosis not present

## 2017-03-13 ENCOUNTER — Ambulatory Visit
Admission: RE | Admit: 2017-03-13 | Discharge: 2017-03-13 | Disposition: A | Discharge status: Home / Self Care | Payer: Medicare Other | Source: Ambulatory Visit | Attending: Radiation Oncology | Admitting: Radiation Oncology

## 2017-03-13 DIAGNOSIS — Z510 Encounter for antineoplastic radiation therapy: Secondary | ICD-10-CM | POA: Diagnosis not present

## 2017-03-13 DIAGNOSIS — D0511 Intraductal carcinoma in situ of right breast: Secondary | ICD-10-CM | POA: Diagnosis not present

## 2017-03-14 ENCOUNTER — Ambulatory Visit: Payer: Medicare Other

## 2017-03-14 ENCOUNTER — Ambulatory Visit
Admission: RE | Admit: 2017-03-14 | Discharge: 2017-03-14 | Disposition: A | Discharge status: Home / Self Care | Payer: Medicare Other | Source: Ambulatory Visit | Attending: Radiation Oncology | Admitting: Radiation Oncology

## 2017-03-14 DIAGNOSIS — Z510 Encounter for antineoplastic radiation therapy: Secondary | ICD-10-CM | POA: Diagnosis not present

## 2017-03-14 DIAGNOSIS — D0511 Intraductal carcinoma in situ of right breast: Secondary | ICD-10-CM | POA: Diagnosis not present

## 2017-03-17 ENCOUNTER — Ambulatory Visit: Payer: Medicare Other

## 2017-03-18 ENCOUNTER — Encounter: Payer: Self-pay | Admitting: Radiation Oncology

## 2017-03-18 NOTE — Progress Notes (Signed)
  Radiation Oncology         [phone] ________________________________  Name: [name] MRN: [id number]  Date: 03/18/2017  DOB: [birth year]  End of Treatment Note  Diagnosis:   Stage T3N1M0, Right Breast, 12 o'clock, Invasive Ductal Carcinoma, ER (NEG)  / PR (NEG) / Her2 (NEG), Grade 2      Indication for treatment:  palliative       Radiation treatment dates:   02/13/17 - 03/14/17  Site/dose:   1) Right Breast and axilla / 42.56 Gy in 16 fractions 2) Right Breast Boost / 12.5 Gy in 5 fractions  Beams/energy:  1) high tangents, 3D conformal  / 6MV 2)   photons /  6MV  Narrative: The patient tolerated radiation treatment relatively well.   She denied fatigue throughout treatment but did endorse some pain at the right breast. She endorsed use of Radiaplex.  Plan: The patient has completed radiation treatment. The patient will return to radiation oncology clinic for routine followup in one month. I advised them to call or return sooner if they have any questions or concerns related to their recovery or treatment.  -----------------------------------  Eppie Gibson, MD  This document serves as a record of services personally performed by Eppie Gibson, MD. It was created on his behalf by Linward Natal, a trained medical scribe. The creation of this record is based on the scribe's personal observations and the provider's statements to them. This document has been checked and approved by the attending provider.

## 2017-03-27 NOTE — Progress Notes (Addendum)
[place]  [phone] [phone]  Clinic Follow Up Note   Patient Care Team: [name], MD as PCP - General (Internal Medicine) Juanita Craver, MD as Consulting Physician (Gastroenterology) Shon Hough, MD as Consulting Physician (Ophthalmology) Thornell Sartorius, MD as Consulting Physician (Otolaryngology) [place], Man X, NP as Nurse Practitioner (Nurse Practitioner) Bo Merino, MD as Consulting Physician (Rheumatology) Suella Broad, MD as Consulting Physician (Physical Medicine and Rehabilitation) Netta Cedars, MD as Consulting Physician (Orthopedic Surgery) Fanny Skates, MD as Consulting Physician (General Surgery) Truitt Merle, MD as Consulting Physician (Hematology) Thea Silversmith, MD (Inactive) as Consulting Physician (Radiation Oncology) Rockwell Germany, RN as Registered Nurse Mauro Kaufmann, RN as Registered Nurse Jake Shark, Johny Blamer, NP as Nurse Practitioner (Nurse Practitioner) [name], MD as Consulting Physician (Cardiology)    Date of Service:  03/31/2017  CHIEF COMPLAINTS:  Follow up right breast cancer, triple negative   Oncology History   Breast cancer of upper-outer quadrant of left female breast   Staging form: Breast, AJCC 7th Edition     Clinical: Stage 0 (Tis (DCIS), N0, M0) - Unsigned       Breast cancer of upper-outer quadrant of left female breast ([place])   09/23/2014 Mammogram    Left breast: new cluster of calcifications in the left breast is indeterminate. Spot magnification views are recommended      10/13/2014 Initial Biopsy    Left breast biopsy: DCIS, high-grade, with calcification and necrosis. ER- (0%), PR- (0%)      10/13/2014 Clinical Stage    Stage 0: Tis N0      03/15/2015 Definitive Surgery    Left lumpectomy: DCIS, high grade, with comedonecrosis and calcifications, spanning 1.5 cm, ALH, ER- (0%), PR- (0%).      03/15/2015 Pathologic Stage      Stage 0: Tis N0  Diagnosis 03/14/16 Breast, lumpectomy, Left - HIGH GRADE DUCTAL CARCINOMA IN SITU WITH COMEDONECROSIS AND CALCIFICATIONS, SPANNING APPROXIMATELY 1.5 CM IN GREATEST DIMENSION. - ATYPICAL LOBULAR HYPERPLASIA. - DUCTAL CARCINOMA IN SITU IS LESS THAN 0.2 CM IN SEVERAL AREAS TO THE ANTERIOR MARGIN. 1 of 3      03/29/2015 Survivorship    Survivorship care plan completed and mailed to patient in lieu of in person visit       Cancer of central portion of right female breast ([place])   11/26/2016 Mammogram    Diagnostic Mammogram and [place] 11/26/16  IMPRESSION:  The is a 4.2 cm lobulated mass in the right breast central to the nipple anterior depth is highly suggestive of malignancy. The lymph node in the right axilla is at a moderate concern but not classic for malignancy. An ultrasound guided biopsy is recommended.         11/26/2016 Initial Biopsy    Diagnosis 11/26/16 1. Breast, right, needle core biopsy, 12:00 o'clock - INVASIVE DUCTAL CARCINOMA - SEE COMMENT 2. Lymph node, needle/core biopsy, right axilla - METASTATIC CARCINOMA INVOLVING ONE LYMPH NODE       11/26/2016 Receptors her2    Estrogen Receptor: 0%, NEGATIVE Progesterone Receptor: 0%, NEGATIVE Proliferation Marker Ki67: 20% HER2 - NEGATIVE      11/29/2016 Initial Diagnosis    Infiltrating ductal carcinoma of right breast ([place])      01/10/2017 PET scan    PET 01/10/17  IMPRESSION: 1. Right subareolar mass measures 4.7 cm in long axis with maximum SUV 23.6 compatible with malignancy. There are 2 hypermetabolic mildly enlarged right axillary lymph nodes but  no other metastatic lesions are identified. 2. Other imaging findings of potential clinical significance: Aortic Atherosclerosis (ICD10-I70.0). Coronary atherosclerosis. Mild cardiomegaly. Trace left pleural effusion. Intracranial chronic ischemic microvascular white matter disease.      02/14/2017 - 03/14/2017 Radiation Therapy    Radiation  therapy with Dr. Isidore Moos       HISTORY OF PRESENTING ILLNESS: 03/14/16 [name] 84 y.o. female with multiple medical comorbidities, as listed below, is here because of recently diagnosed left breast DCIS. She presents to our multidisciplinary breast clinic today with her daughter.  This was discovered by screening mammogram. She did not have palpable breast mass, she denies any new symptoms lately. She had a stroke earlier this year, which resulted mild left-sided weakness. She is a resident in an assisted living, she is able to walk with a walker, and do some limited self care, but she is not active, spends most time on sitting during the day. Her daughter states she skips meals sometime, and does not go out often.   CURRENT THERAPY: Surveillance  INTERVAL HISTORY:  [name] is here for a follow up of her triple negative right breast cancer. She presents to the clinic today accompanied by her caregiver.  She has completed radiation to her right breast tumor about 2 weeks ago, she tolerated the radiation very well overall.  She denies any significant pain, or other new symptoms.   MEDICAL HISTORY:  Past Medical History:  Diagnosis Date  . Arthritis    Osteoarthritis  . Cancer of left breast [facility]) August 2016  . Cerebral atherosclerosis 12/19/2012  . Cerebral atrophy 12/19/2012  . Cerebral ischemia 12/19/2012   Microvascular   . CKD (chronic kidney disease) stage 3, GFR 30-59 ml/min (HCC) 11/03/2014  . Cough   . Dementia   . Diverticulosis of colon (without mention of hemorrhage)   . Edema   . Family history of breast cancer   . Flat feet   . GERD (gastroesophageal reflux disease)   . Glaucoma   . Heart murmur   . Hyperlipidemia   . Hypertension   . Hypertonicity of bladder   . Ischemic cardiomyopathy 11/04/2014  . Lumbago   . Memory change 03/28/2016  . Osteoarthrosis, unspecified whether generalized or localized, unspecified site   . Other malaise and fatigue   .  Pain in joint, pelvic region and thigh   . Pain in joint, shoulder region   . Personal history of fall   . RLQ abdominal pain 01/17/2011  . Senile osteoporosis   . Small bowel obstruction ([place])   . STEMI (ST elevation myocardial infarction) ([place]) 11/04/2014  . Unstable gait 11/04/2014  . Urine, incontinence, stress female 09/18/2014    SURGICAL HISTORY: Past Surgical History:  Procedure Laterality Date  . BREAST LUMPECTOMY Bilateral   . BREAST LUMPECTOMY WITH RADIOACTIVE SEED LOCALIZATION Left 03/15/2015   Procedure: LEFT BREAST LUMPECTOMY WITH RADIOACTIVE SEED LOCALIZATION;  Surgeon: Fanny Skates, MD;  Location: [place];  Service: General;  Laterality: Left;  . CARDIAC CATHETERIZATION N/A 11/08/2014   Procedure: Left Heart Cath and Coronary Angiography;  Surgeon: [name], MD;  Location: [facility];  Service: Cardiovascular;  Laterality: N/A;  . COLON SURGERY  02/1996   Colectomy, partial for diverticular bleed 90%  . EYE SURGERY    . JOINT REPLACEMENT Left 12/2000   Left knee; Dr. Wynelle Link  . LAPAROSCOPIC LYSIS OF ADHESIONS  11/15/10   Exploratory  . TONSILLECTOMY     GYN HISTORY  Menarchal: 14 LMP: 52 Contraceptive: HRT: no  G0P0: adopted daughter   SOCIAL HISTORY: Social History   Socioeconomic History  . Marital status: Widowed    Spouse name: Not on file  . Number of children: Not on file  . Years of education: Not on file  . Highest education level: Not on file  Social Needs  . Financial resource strain: Patient refused  . Food insecurity - worry: Patient refused  . Food insecurity - inability: Patient refused  . Transportation needs - medical: Patient refused  . Transportation needs - non-medical: Patient refused  Occupational History  . Not on file  Tobacco Use  . Smoking status: Former Smoker    Packs/day: 0.50    Years: 30.00    Pack years: 15.00    Types: Cigarettes    Last attempt to quit: 12/16/1980    Years since quitting: 36.3  .  Smokeless tobacco: Never Used  Substance and Sexual Activity  . Alcohol use: Yes    Alcohol/week: 0.6 oz    Types: 1 Glasses of wine per week    Comment: occasional glass of wine  . Drug use: No  . Sexual activity: No  Other Topics Concern  . Not on file  Social History Narrative   Lives at [facility] 05/2011   Widowed 2003   Living Will   [place] with cane   Never smoked    Exercise none     FAMILY HISTORY: Family History  Problem Relation Age of Onset  . Cancer Father 22       colon  . Cancer Maternal Aunt        breast cancer   . Cancer Cousin        breast cancer     ALLERGIES:  is allergic to sulfa antibiotics.  MEDICATIONS:  Current Outpatient Medications  Medication Sig Dispense Refill  . acetaminophen (TYLENOL) 325 MG tablet Take 650 mg by mouth 2 (two) times daily. Take 2 tablets every 6 hours as needed for mild pain or fever    . amLODipine (NORVASC) 2.5 MG tablet Take 2.5 mg by mouth daily.    Marland Kitchen atorvastatin (LIPITOR) 10 MG tablet Take 10 mg by mouth daily.    . carvedilol (COREG) 12.5 MG tablet Take 12.5 mg by mouth 2 (two) times daily with a meal.    . cloNIDine (CATAPRES) 0.1 MG tablet Take 0.1 mg by mouth 2 (two) times daily.    . Coenzyme Q10 50 MG TABS Take one tablet by mouth at bedtime    . famotidine (PEPCID) 20 MG tablet Take 20 mg by mouth at bedtime.     . folic acid (FOLVITE) 1 MG tablet Take 1 mg by mouth daily.    . isosorbide mononitrate (IMDUR) 60 MG 24 hr tablet Take 90 mg by mouth at bedtime.     Marland Kitchen KLOR-CON 10 10 MEQ tablet Take 10 mEq by mouth daily.    Marland Kitchen latanoprost (XALATAN) 0.005 % ophthalmic solution Place 1 drop into both eyes at bedtime.     . torsemide (DEMADEX) 10 MG tablet Take 10 mg daily by mouth. Hold for SBP < 110    . vitamin B-12 (CYANOCOBALAMIN) 1000 MCG tablet Take 1,000 mcg See admin instructions by mouth. Take 1000 mcg by mouth daily On Mondays,Wednesdays and Fridays    . alum & mag hydroxide-simeth  (MAALOX/MYLANTA) 200-200-20 MG/5ML suspension Take 30 mLs by mouth every 4 (four) hours as needed for indigestion.     Marland Kitchen  BETIMOL 0.5 % ophthalmic solution Place 1 drop daily into both eyes.     . Hypromellose (ARTIFICIAL TEARS OP) Place 2 drops 2 (two) times daily into both eyes.      No current facility-administered medications for this visit.     REVIEW OF SYSTEMS:   Constitutional: Denies fevers, chills or abnormal night sweats Eyes: Denies blurriness of vision, double vision or watery eyes Ears, nose, mouth, throat, and face: Denies mucositis or sore throat Respiratory: Denies cough, dyspnea or wheezes Cardiovascular: Denies palpitation, chest discomfort or lower extremity swelling Gastrointestinal:  Denies nausea, heartburn or change in bowel habits Skin: Denies abnormal skin rashes Lymphatics: Denies new lymphadenopathy or easy bruising Neurological:Denies numbness, tingling or new weaknesses MSK: (+) sore arms and sore legs (+) wheelchair bound  Breast: (+) palpable mass in right breast  Behavioral/Psych: Mood is stable, no new changes  All other systems were reviewed with the patient and are negative.   PHYSICAL EXAMINATION: ECOG PERFORMANCE STATUS: 3 - Symptomatic, >50% confined to bed  Vitals:   03/31/17 0955  BP: (!) 157/73  Pulse: (!) 59  Resp: 18  Temp: 98.4 F (36.9 C)  SpO2: 99%   Filed Weights    GENERAL:alert, no distress and comfortable  (+) she is wheelchair bound  SKIN: skin color, texture, turgor are normal, no rashes or significant lesions EYES: normal, conjunctiva are pink and non-injected, sclera clear OROPHARYNX:no exudate, no erythema and lips, buccal mucosa, and tongue normal  NECK: supple, thyroid normal size, non-tender, without nodularity LYMPH:  no palpable lymphadenopathy in the cervical, axillary or inguinal LUNGS: clear to auscultation and percussion with normal breathing effort HEART: regular rate & rhythm and no murmurs and no lower  extremity edema ABDOMEN:abdomen soft, non-tender and normal bowel sounds Musculoskeletal:no cyanosis of digits and no clubbing  PSYCH: alert & oriented x 3 with fluent speech NEURO: no focal motor/sensory deficits Breasts: Breast: Exam done in wheelchair: She has a 4X5cm (previously 7X6cm mass) in the central portion of right breast with skin hyperpigmentation, no open ulcers (see picture below). She has a 3-4cm palpable lymph node in right axilla.  Left breast and axilla has no palpable mass or adenopathy.          LABORATORY DATA:  I have reviewed the data as listed CBC Latest Ref Rng & Units 03/31/2017 03/11/2017 01/14/2017  WBC 3.9 - 10.3 K/uL 4.9 6.4 6.5  Hemoglobin 11.6 - 15.9 g/dL 10.9(L) 9.8(A) 9.3(A)  Hematocrit 34.8 - 46.6 % 33.7(L) 30(A) 28(A)  Platelets 145 - 400 K/uL 190 214 197   CMP Latest Ref Rng & Units 03/31/2017 03/11/2017 12/26/2016  Glucose 70 - 140 mg/dL 97 - -  BUN 7 - 26 mg/dL 25 33(A) 34(A)  Creatinine 0.60 - 1.10 mg/dL 2.22(H) 2.3(A) 2.6(A)  Sodium 136 - 145 mmol/L 139 138 139  Potassium 3.5 - 5.1 mmol/L 4.0 4.0 4.2  Chloride 98 - 109 mmol/L 106 - -  CO2 22 - 29 mmol/L 23 - -  Calcium 8.4 - 10.4 mg/dL 9.3 - -  Total Protein 6.4 - 8.3 g/dL 7.1 - -  Total Bilirubin 0.2 - 1.2 mg/dL 0.5 - -  Alkaline Phos 40 - 150 U/L 53 39 -  AST 5 - 34 U/L 15 12(A) -  ALT 0 - 55 U/L <6 4(A) -     PATHOLOGY REPORT:  Diagnosis 11/26/16 1. Breast, right, needle core biopsy, 12:00 o'clock - INVASIVE DUCTAL CARCINOMA - SEE COMMENT 2. Lymph node, needle/core biopsy,  right axilla - METASTATIC CARCINOMA INVOLVING ONE LYMPH NODE Microscopic Comment 1. Based on the biopsy, the carcinoma appears Nottingham grade 2 of 3 and measures 0.9 cm in greatest linear extent. Dr. Saralyn Pilar reviewed the case. Prognostic markers (ER/PR/ki-67/HER2-FISH) are pending and will be reported in an addendum. This case was called to Dr. Marcelo Baldy on November 27, 2016. 2. FLUORESCENCE IN-SITU  HYBRIDIZATION Results: HER2 - NEGATIVE RATIO OF HER2/CEP17 SIGNALS 1.38 AVERAGE HER2 COPY NUMBER PER CELL 2.00 Reference Range: NEGATIVE HER2/CEP17 Ratio <2.0 and average HER2 copy number <4.0 EQUIVOCAL HER2/CEP17 Ratio <2.0 and average HER2 copy number >=4.0 and <6.0 POSITIVE HER2/CEP17 Ratio >=2.0 or <2.0 and average HER2 copy number >=6.0 Vicente Males MD Pathologist, Electronic Signature ( Signed 11/29/2016) 2. PROGNOSTIC INDICATORS Results: IMMUNOHISTOCHEMICAL AND MORPHOMETRIC ANALYSIS PERFORMED MANUALLY Estrogen Receptor: 0%, NEGATIVE Progesterone Receptor: 0%, NEGATIVE Proliferation Marker Ki67: 20% COMMENT: The negative hormone receptor study(ies) in this case has no internal positive control.   Diagnosis 03/14/16 Breast, lumpectomy, Left - HIGH GRADE DUCTAL CARCINOMA IN SITU WITH COMEDONECROSIS AND CALCIFICATIONS, SPANNING APPROXIMATELY 1.5 CM IN GREATEST DIMENSION. - ATYPICAL LOBULAR HYPERPLASIA. - DUCTAL CARCINOMA IN SITU IS LESS THAN 0.2 CM IN SEVERAL AREAS TO THE ANTERIOR MARGIN. 1 of 3 FINAL for [place], Javana M ([id number]) Diagnosis(continued) - DUCTAL CARCINOMA IN SITU IS FOCALLY LESS THAN 0.2 CM TO THE SUPERIOR AND POSTERIOR MARGINS. - OTHER MARGINS ARE NEGATIVE. - SEE ONCOLOGY TEMPLATE. Microscopic Comment BREAST, IN SITU CARCINOMA Specimen, including laterality: Left partial breast. Procedure (include lymph node sampling sentinel-non-sentinel): Left breast lumpectomy. Grade of carcinoma: High grade. Necrosis: Yes, comedonecrosis is present. Estimated tumor size: (gross measurement): 1.5 cm. Treatment effect: Not applicable. Distance to closest margin: Ductal carcinoma in situ is less than 0.2 cm from anterior, superior and posterior margins. Breast prognostic profile: Performed on previous case [id number]. Estrogen receptor: 0%, negative. Progesterone receptor: 0%, negative. Lymph nodes: No lymph nodes received. TNM: pTis, pNX. Comments: A p63, smooth  muscle myosin and calponin immunohistochemical stain is performed on three blocks (nine stains total) to rule out the possibility of invasive carcinoma. The staining pattern fails to demonstrate evidence of invasive carcinoma. As estrogen receptor and progesterone receptor were previously negative, these markers will be repeated on the tumor and reported in an addendum to follow. (RH:ds 03/16/15) PROGNOSTIC INDICATORS Results: IMMUNOHISTOCHEMICAL AND MORPHOMETRIC ANALYSIS PERFORMED MANUALLY Estrogen Receptor: 0%, NEGATIVE Progesterone Receptor: 0%, NEGATIVE COMMENT: The negative hormone receptor study(ies) in this case has an internal positive control.    Diagnosis 10/13/2014 Breast, left, needle core biopsy, calcs - HIGH GRADE DUCTAL CARCINOMA IN SITU WITH CALCIFICATIONS AND NECROSIS. - SEE MICROSCOPIC DESCRIPTION. - FOCAL ATYPICAL LOBULAR HYPERPLASIA. Microscopic Comment Immunohistochemistry for basal cell markers show positivity with Calponin, p63, and smooth muscle myosin supporting the diagnosis of high grade ductal carcinoma in situ. Estrogen and progesterone receptors will be performed. Results: IMMUNOHISTOCHEMICAL AND MORPHOMETRIC ANALYSIS PERFORMED MANUALLY Estrogen Receptor: 0%, NEGATIVE Progesterone Receptor: 0%, NEGATIVE   RADIOGRAPHIC STUDIES: I have personally reviewed the radiological images as listed and agreed with the findings in the report.  PET 01/10/17  IMPRESSION: 1. Right subareolar mass measures 4.7 cm in long axis with maximum SUV 23.6 compatible with malignancy. There are 2 hypermetabolic mildly enlarged right axillary lymph nodes but no other metastatic lesions are identified. 2. Other imaging findings of potential clinical significance: Aortic Atherosclerosis (ICD10-I70.0). Coronary atherosclerosis. Mild cardiomegaly. Trace left pleural effusion. Intracranial chronic ischemic microvascular white matter disease.  Diagnostic Mammogram and [place] 11/26/16    IMPRESSION:  The is a 4.2 cm lobulated mass in the right breast central to the nipple anterior depth is highly suggestive of malignancy. The lymph node in the right axilla is at a moderate concern but not classic for malignancy. An ultrasound guided biopsy is recommended.     Mammogram 09/28/2014 There is a new cluster of pleomorphic calcifications in the left breast at 12:00 middle depth.   ASSESSMENT & PLAN: 84 y.o. Caucasian female, postmenopausal, with multiple comorbidities, including a stroke in January 2016, wheelchair bound, dementia, myocardial infarction, CHF, a resident at assisted living and h/o of left breast DCIS in 2016.    1. Infiltrating ductal carcinoma of right breast, invasive ductal carcinoma, cT 3N1M0, stage IIIb, G2, triple Negative -I reviewed her image findings and her biopsy results. She has aggressive triple negative breast cancer.  -On physical exam, she has a large central breast mass, with bulky right adenopathy.   -I reviewed her staging PET scan, which showed no distant metastasis -Due to her rapidly growing disease she was evaluated for right mastectomy with Dr. Dalbert Batman, but due to her high risk of surgery and anesthesia secondary to her heart condition, patient's daughter has decided not to pursue mastectomy.   -Given her advanced age and comorbidities, she is not a candidate for chemotherapy.  -Due to her triple negative disease she is not a candidate for antiestrogen therapy.  -Now has completed palliative radiation, and her right breast tumor has shrunk some -Given her recurrent breast cancer and family history she is eligible for genetic testing. If her results show she has BRCA gene mutation she can consider PARP inhibitor. She declined during her genetic counseling consult.  -Continue supportive care and observation -if she becomes symptomatic from her breast cancer, I would recommend hospice care  2. H/o Left breast DCIS, high-grade, ER negative/PR  negative, 2016  -Reviewed her scan and breast biopsy results. -The patient had early stage disease. She is considered cured of disease after completing surgical resection.  -She was seen by Dr. Dalbert Batman, lumpectomy without sentinel lymph nodes biopsy was discussed with patient and her daughter. Given her advanced age and medical comorbidities, she does have some risk with anesthesia. Patient and her daughter are in favor of having surgery. Any form of adjuvant treatment is for prevention of disease recurrence.  -Given her negative ER and PR status of her tumor, I do not recommend any antiestrogen therapy.  -If she has wide negative surgical margin, she may not benefit much from adjuvant radiation and also, given her advanced age. She was seen by Dr. Pablo Ledger  -She underwent Left Breast Lumpectomy on 03/15/15 by Dr. Dalbert Batman    3. Dementia -Wheelchair bound, She can feed herself, otherwise she is under full scale care at [facility].  -Her daughter is her Legal guardian    Plan  -She has had a good response to radiation, continue observation -Called her daughter and left a message -Follow-up in 3 months  All questions were answered. The patient knows to call the clinic with any problems, questions or concerns. I spent 15 minutes counseling the patient face to face. The total time spent in the appointment was 20 minutes and more than 50% was on counseling.     Truitt Merle, MD 03/31/2017

## 2017-03-31 ENCOUNTER — Inpatient Hospital Stay: Payer: Medicare Other

## 2017-03-31 ENCOUNTER — Inpatient Hospital Stay: Payer: Medicare Other | Attending: Genetic Counselor | Admitting: Hematology

## 2017-03-31 ENCOUNTER — Telehealth: Payer: Self-pay | Admitting: Hematology

## 2017-03-31 VITALS — BP 157/73 | HR 59 | Temp 98.4°F | Resp 18 | Ht 66.0 in

## 2017-03-31 DIAGNOSIS — I2510 Atherosclerotic heart disease of native coronary artery without angina pectoris: Secondary | ICD-10-CM | POA: Insufficient documentation

## 2017-03-31 DIAGNOSIS — I252 Old myocardial infarction: Secondary | ICD-10-CM | POA: Diagnosis not present

## 2017-03-31 DIAGNOSIS — Z923 Personal history of irradiation: Secondary | ICD-10-CM | POA: Diagnosis not present

## 2017-03-31 DIAGNOSIS — Z79899 Other long term (current) drug therapy: Secondary | ICD-10-CM | POA: Diagnosis not present

## 2017-03-31 DIAGNOSIS — Z171 Estrogen receptor negative status [ER-]: Secondary | ICD-10-CM | POA: Insufficient documentation

## 2017-03-31 DIAGNOSIS — E785 Hyperlipidemia, unspecified: Secondary | ICD-10-CM | POA: Diagnosis not present

## 2017-03-31 DIAGNOSIS — F0390 Unspecified dementia without behavioral disturbance: Secondary | ICD-10-CM | POA: Diagnosis not present

## 2017-03-31 DIAGNOSIS — D0512 Intraductal carcinoma in situ of left breast: Secondary | ICD-10-CM | POA: Diagnosis not present

## 2017-03-31 DIAGNOSIS — Z993 Dependence on wheelchair: Secondary | ICD-10-CM | POA: Diagnosis not present

## 2017-03-31 DIAGNOSIS — Z8673 Personal history of transient ischemic attack (TIA), and cerebral infarction without residual deficits: Secondary | ICD-10-CM | POA: Insufficient documentation

## 2017-03-31 DIAGNOSIS — K219 Gastro-esophageal reflux disease without esophagitis: Secondary | ICD-10-CM | POA: Insufficient documentation

## 2017-03-31 DIAGNOSIS — C50412 Malignant neoplasm of upper-outer quadrant of left female breast: Secondary | ICD-10-CM

## 2017-03-31 DIAGNOSIS — C50111 Malignant neoplasm of central portion of right female breast: Secondary | ICD-10-CM | POA: Insufficient documentation

## 2017-03-31 DIAGNOSIS — I129 Hypertensive chronic kidney disease with stage 1 through stage 4 chronic kidney disease, or unspecified chronic kidney disease: Secondary | ICD-10-CM | POA: Diagnosis not present

## 2017-03-31 DIAGNOSIS — N183 Chronic kidney disease, stage 3 (moderate): Secondary | ICD-10-CM | POA: Diagnosis not present

## 2017-03-31 DIAGNOSIS — M1990 Unspecified osteoarthritis, unspecified site: Secondary | ICD-10-CM | POA: Diagnosis not present

## 2017-03-31 DIAGNOSIS — Z87891 Personal history of nicotine dependence: Secondary | ICD-10-CM | POA: Insufficient documentation

## 2017-03-31 DIAGNOSIS — I509 Heart failure, unspecified: Secondary | ICD-10-CM | POA: Diagnosis not present

## 2017-03-31 DIAGNOSIS — Z9181 History of falling: Secondary | ICD-10-CM | POA: Insufficient documentation

## 2017-03-31 LAB — COMPREHENSIVE METABOLIC PANEL
ALBUMIN: 3.8 g/dL (ref 3.5–5.0)
AST: 15 U/L (ref 5–34)
Alkaline Phosphatase: 53 U/L (ref 40–150)
Anion gap: 10 (ref 3–11)
BUN: 25 mg/dL (ref 7–26)
CHLORIDE: 106 mmol/L (ref 98–109)
CO2: 23 mmol/L (ref 22–29)
CREATININE: 2.22 mg/dL — AB (ref 0.60–1.10)
Calcium: 9.3 mg/dL (ref 8.4–10.4)
GFR calc Af Amer: 21 mL/min — ABNORMAL LOW (ref >60)
GFR, EST NON AFRICAN AMERICAN: 18 mL/min — AB (ref >60)
GLUCOSE: 97 mg/dL (ref 70–140)
POTASSIUM: 4 mmol/L (ref 3.5–5.1)
Sodium: 139 mmol/L (ref 136–145)
Total Bilirubin: 0.5 mg/dL (ref 0.2–1.2)
Total Protein: 7.1 g/dL (ref 6.4–8.3)

## 2017-03-31 LAB — CBC WITH DIFFERENTIAL/PLATELET
Basophils Absolute: 0 10*3/uL (ref 0.0–0.1)
Basophils Relative: 0 %
Eosinophils Absolute: 0.3 10*3/uL (ref 0.0–0.5)
Eosinophils Relative: 6 %
HCT: 33.7 % — ABNORMAL LOW (ref 34.8–46.6)
Hemoglobin: 10.9 g/dL — ABNORMAL LOW (ref 11.6–15.9)
Lymphocytes Relative: 12 %
Lymphs Abs: 0.6 10*3/uL — ABNORMAL LOW (ref 0.9–3.3)
MCH: 29.2 pg (ref 25.1–34.0)
MCHC: 32.3 g/dL (ref 31.5–36.0)
MCV: 90.3 fL (ref 79.5–101.0)
Monocytes Absolute: 0.7 10*3/uL (ref 0.1–0.9)
Monocytes Relative: 15 %
Neutro Abs: 3.3 10*3/uL (ref 1.5–6.5)
Neutrophils Relative %: 67 %
Platelets: 190 10*3/uL (ref 145–400)
RBC: 3.73 MIL/uL (ref 3.70–5.45)
RDW: 14.8 % — ABNORMAL HIGH (ref 11.2–14.5)
WBC: 4.9 10*3/uL (ref 3.9–10.3)

## 2017-03-31 NOTE — Telephone Encounter (Signed)
Gave patient avs and calendar with appts per 2/11 los.  °

## 2017-04-05 ENCOUNTER — Encounter: Payer: Self-pay | Admitting: Hematology

## 2017-04-10 ENCOUNTER — Encounter: Payer: Self-pay | Admitting: Internal Medicine

## 2017-04-10 NOTE — Patient Instructions (Signed)
Opened in error

## 2017-04-10 NOTE — Progress Notes (Signed)
Opened in error

## 2017-04-15 ENCOUNTER — Encounter: Payer: Self-pay | Admitting: Internal Medicine

## 2017-04-15 NOTE — Patient Instructions (Signed)
Opened in error

## 2017-04-15 NOTE — Progress Notes (Signed)
Opened in error

## 2017-04-17 ENCOUNTER — Non-Acute Institutional Stay (SKILLED_NURSING_FACILITY): Payer: Medicare Other | Admitting: Internal Medicine

## 2017-04-17 ENCOUNTER — Encounter: Payer: Self-pay | Admitting: Internal Medicine

## 2017-04-17 DIAGNOSIS — N184 Chronic kidney disease, stage 4 (severe): Secondary | ICD-10-CM | POA: Diagnosis not present

## 2017-04-17 DIAGNOSIS — I10 Essential (primary) hypertension: Secondary | ICD-10-CM | POA: Diagnosis not present

## 2017-04-17 DIAGNOSIS — M159 Polyosteoarthritis, unspecified: Secondary | ICD-10-CM | POA: Diagnosis not present

## 2017-04-17 DIAGNOSIS — I5032 Chronic diastolic (congestive) heart failure: Secondary | ICD-10-CM | POA: Diagnosis not present

## 2017-04-17 DIAGNOSIS — E782 Mixed hyperlipidemia: Secondary | ICD-10-CM

## 2017-04-17 NOTE — Progress Notes (Signed)
Location:  [place] Room Number: 5 Place of Service:  SNF [phone]) Provider:  [name] MD  [name], MD  Patient Care Team: [name], MD as PCP - General (Internal Medicine) Juanita Craver, MD as Consulting Physician (Gastroenterology) Shon Hough, MD as Consulting Physician (Ophthalmology) Thornell Sartorius, MD as Consulting Physician (Otolaryngology) Lorraine, [place] Mast, Man X, NP as Nurse Practitioner (Nurse Practitioner) Bo Merino, MD as Consulting Physician (Rheumatology) Suella Broad, MD as Consulting Physician (Physical Medicine and Rehabilitation) Netta Cedars, MD as Consulting Physician (Orthopedic Surgery) Fanny Skates, MD as Consulting Physician (General Surgery) Truitt Merle, MD as Consulting Physician (Hematology) Thea Silversmith, MD (Inactive) as Consulting Physician (Radiation Oncology) Rockwell Germany, RN as Registered Nurse Mauro Kaufmann, RN as Registered Nurse Jake Shark, Johny Blamer, NP as Nurse Practitioner (Nurse Practitioner) [name], MD as Consulting Physician (Cardiology)  Extended Emergency Contact Information Primary Emergency Contact: [name],[name] Address: [address]          [place] [address] [place] Phone: [phone] Mobile Phone: [phone] Relation: Daughter Secondary Emergency Contact: [name],[name]  [place] Mobile Phone: [phone] Relation: None  Code Status:  DNR  Goals of care: Advanced Directive information Advanced Directives 04/17/2017  Does Patient Have a Medical Advance Directive? Yes  Type of Advance Directive Out of facility DNR (pink MOST or yellow form)  Does patient want to make changes to medical advance directive? No - Patient declined  Copy of [place] in Chart? -  Pre-existing out of facility DNR order (yellow form or pink MOST form) Yellow form placed in chart (order not  valid for inpatient use);Pink MOST form placed in chart (order not valid for inpatient use)     Chief Complaint  Patient presents with  . Medical Management of Chronic Issues    Routine Visit     HPI:  Pt is a 84 y.o. female seen today for medical management of chronic diseases. She is pleasantly confused. She denies any health concern this visit. She is followed by Cancer center for malignant neoplasm of both breast. Reviewed note form 03/31/17 from Dr Ernestina Penna office. She has had a good response to radiation and has follow up in 3 months. No acute concern from nursing.    Past Medical History:  Diagnosis Date  . Arthritis    Osteoarthritis  . Cancer of left breast [facility]) August 2016  . Cerebral atherosclerosis 12/19/2012  . Cerebral atrophy 12/19/2012  . Cerebral ischemia 12/19/2012   Microvascular   . CKD (chronic kidney disease) stage 3, GFR 30-59 ml/min (HCC) 11/03/2014  . Cough   . Dementia   . Diverticulosis of colon (without mention of hemorrhage)   . Edema   . Family history of breast cancer   . Flat feet   . GERD (gastroesophageal reflux disease)   . Glaucoma   . Heart murmur   . Hyperlipidemia   . Hypertension   . Hypertonicity of bladder   . Ischemic cardiomyopathy 11/04/2014  . Lumbago   . Memory change 03/28/2016  . Osteoarthrosis, unspecified whether generalized or localized, unspecified site   . Other malaise and fatigue   . Pain in joint, pelvic region and thigh   . Pain in joint, shoulder region   . Personal history of fall   . RLQ abdominal pain 01/17/2011  . Senile osteoporosis   . Small bowel obstruction ([place])   . STEMI (ST elevation myocardial infarction) ([place]) 11/04/2014  .  Unstable gait 11/04/2014  . Urine, incontinence, stress female 09/18/2014   Past Surgical History:  Procedure Laterality Date  . BREAST LUMPECTOMY Bilateral   . BREAST LUMPECTOMY WITH RADIOACTIVE SEED LOCALIZATION Left 03/15/2015   Procedure: LEFT BREAST LUMPECTOMY WITH RADIOACTIVE  SEED LOCALIZATION;  Surgeon: Fanny Skates, MD;  Location: [place];  Service: General;  Laterality: Left;  . CARDIAC CATHETERIZATION N/A 11/08/2014   Procedure: Left Heart Cath and Coronary Angiography;  Surgeon: [name], MD;  Location: [place] CV LAB;  Service: Cardiovascular;  Laterality: N/A;  . COLON SURGERY  02/1996   Colectomy, partial for diverticular bleed 90%  . EYE SURGERY    . JOINT REPLACEMENT Left 12/2000   Left knee; Dr. Wynelle Link  . LAPAROSCOPIC LYSIS OF ADHESIONS  11/15/10   Exploratory  . TONSILLECTOMY      Allergies  Allergen Reactions  . Sulfa Antibiotics Itching    Outpatient Encounter Medications as of 04/17/2017  Medication Sig  . acetaminophen (TYLENOL) 325 MG tablet Take 650 mg by mouth 2 (two) times daily. Take 2 tablets every 6 hours as needed for mild pain or fever  . amLODipine (NORVASC) 2.5 MG tablet Take 2.5 mg by mouth daily.  Marland Kitchen atorvastatin (LIPITOR) 10 MG tablet Take 10 mg by mouth daily.  Marland Kitchen BETIMOL 0.5 % ophthalmic solution Place 1 drop daily into both eyes.   . carvedilol (COREG) 12.5 MG tablet Take 12.5 mg by mouth 2 (two) times daily with a meal.  . cloNIDine (CATAPRES) 0.1 MG tablet Take 0.1 mg by mouth 2 (two) times daily.  . Coenzyme Q10 50 MG TABS Take one tablet by mouth at bedtime  . famotidine (PEPCID) 20 MG tablet Take 20 mg by mouth at bedtime.   . folic acid (FOLVITE) 1 MG tablet Take 1 mg by mouth daily.  . Hypromellose (ARTIFICIAL TEARS OP) Place 2 drops 2 (two) times daily into both eyes.   . isosorbide mononitrate (IMDUR) 60 MG 24 hr tablet Take 90 mg by mouth at bedtime.   Marland Kitchen KLOR-CON 10 10 MEQ tablet Take 10 mEq by mouth daily.  Marland Kitchen latanoprost (XALATAN) 0.005 % ophthalmic solution Place 1 drop into both eyes at bedtime.   . torsemide (DEMADEX) 10 MG tablet Take 10 mg daily by mouth. Hold for SBP < 110  . vitamin B-12 (CYANOCOBALAMIN) 1000 MCG tablet Take 1,000 mcg See admin instructions by mouth. Take 1000 mcg by mouth daily On  Mondays,Wednesdays and Fridays   No facility-administered encounter medications on file as of 04/17/2017.     Review of Systems  Constitutional: Negative for appetite change, chills and fever.  HENT: Positive for hearing loss. Negative for congestion and rhinorrhea.   Respiratory: Negative for cough and shortness of breath.   Cardiovascular: Negative for chest pain, palpitations and leg swelling.  Gastrointestinal: Negative for abdominal pain, constipation, diarrhea and nausea.       Moved her bowels yesterday  Genitourinary: Negative for dysuria and hematuria.  Musculoskeletal: Positive for gait problem. Negative for back pain.  Skin: Negative for rash.  Neurological: Negative for light-headedness, numbness and headaches.  Psychiatric/Behavioral: Positive for confusion. Negative for behavioral problems.    Immunization History  Administered Date(s) Administered  . Influenza Whole 11/19/2011, 12/02/2012  . Influenza-Unspecified 12/06/2013, 12/06/2015, 12/02/2016  . PPD Test 11/10/2014  . Pneumococcal Conjugate-13 03/09/2009  . Pneumococcal Polysaccharide-23 02/18/2009  . Tdap 04/02/2016   Pertinent  Health Maintenance Due  Topic Date Due  . MAMMOGRAM  11/26/2017  . INFLUENZA  VACCINE  Completed  . DEXA SCAN  Completed  . PNA vac Low Risk Adult  Completed   Fall Risk  02/05/2017 01/31/2017 07/13/2015 11/17/2014 04/21/2014  Falls in the past year? No No No Yes Yes  Number falls in past yr: - - - 1 1  Injury with Fall? - - - No No  Comment - - - - -  Risk for fall due to : - History of fall(s);Impaired balance/gait;Impaired vision - Impaired balance/gait;History of fall(s) -   Functional Status Survey:    Vitals:   04/17/17 1035  BP: 126/80  Pulse: 80  Resp: 18  Temp: (!) 97.4 F (36.3 C)  TempSrc: Oral  Weight: 147 lb 9.6 oz (67 kg)  Height: 5\' 5"  (1.651 m)   Body mass index is 24.56 kg/m.   Wt Readings from Last 3 Encounters:  04/17/17 147 lb 9.6 oz (67 kg)    04/15/17 147 lb 9.6 oz (67 kg)  03/07/17 146 lb 11.2 oz (66.5 kg)    Physical Exam  Constitutional: She appears well-developed and well-nourished. No distress.  HENT:  Head: Normocephalic and atraumatic.  Mouth/Throat: Oropharynx is clear and moist. No oropharyngeal exudate.  Has hearing aids  Eyes: Conjunctivae and EOM are normal. Pupils are equal, round, and reactive to light. Right eye exhibits no discharge. Left eye exhibits no discharge.  Has corrective glasses  Neck: Normal range of motion. Neck supple.  Cardiovascular: Normal rate and regular rhythm.  Murmur heard. Pulmonary/Chest: Effort normal and breath sounds normal. She has no wheezes. She has no rales.  Abdominal: Soft. Bowel sounds are normal. There is no tenderness. There is no rebound.  Musculoskeletal: She exhibits deformity. She exhibits no edema.  Able to move all 4 extremities, pain to right knee on extension, crepitus present, no joint swelling or redness noted, limited ROM with both shoulders right > left, unsteady gait, uses wheelchair for ambulation  Lymphadenopathy:    She has no cervical adenopathy.  Neurological: She is alert.  Oriented to place and self only  Skin: Skin is warm and dry. No rash noted. She is not diaphoretic.  Psychiatric: She has a normal mood and affect.    Labs reviewed: Recent Labs    08/25/16 0105 08/26/16 0427  12/26/16 03/11/17 03/31/17 0836  NA 138 138   < > 139 138 139  K 3.4* 4.0   < > 4.2 4.0 4.0  CL 106 105  --   --   --  106  CO2 22 21*  --   --   --  23  GLUCOSE 161* 137*  --   --   --  97  BUN 41* 64*   < > 34* 33* 25  CREATININE 2.20* 2.90*   < > 2.6* 2.3* 2.22*  CALCIUM 8.5* 8.2*  --   --   --  9.3  MG 1.9  --   --   --   --   --    < > = values in this interval not displayed.   Recent Labs    08/24/16 1053 12/10/16 03/11/17 03/31/17 0836  AST 17 12* 12* 15  ALT 9* 5* 4* <6  ALKPHOS 42 37 39 53  BILITOT 0.9  --   --  0.5  PROT 7.1  --   --  7.1   ALBUMIN 3.8  --   --  3.8   Recent Labs    08/24/16 1053 08/25/16 0105 08/26/16 0427  01/14/17 03/11/17  03/31/17 0836  WBC 10.8* 19.8* 12.9*   < > 6.5 6.4 4.9  NEUTROABS 8.2*  --   --   --   --   --  3.3  HGB 12.2 11.1* 10.0*   < > 9.3* 9.8* 10.9*  HCT 36.3 33.0* 30.6*   < > 28* 30* 33.7*  MCV 89.9 88.2 86.4  --   --   --  90.3  PLT 186 190 197   < > 197 214 190   < > = values in this interval not displayed.   Lab Results  Component Value Date   TSH 2.21 04/04/2016   Lab Results  Component Value Date   HGBA1C 5.6 11/06/2014   Lab Results  Component Value Date   CHOL 108 11/28/2016   HDL 50 11/28/2016   LDLCALC 41 11/28/2016   TRIG 85 11/28/2016   CHOLHDL 3.1 11/06/2014    Significant Diagnostic Results in last 30 days:  No results found.  Assessment/Plan  CHF Appears euvolemic. Continue imdur, carvedilol, torsemide with kcl and monitor. Weight stable.   Hypertension Few elevated SBP in 150160 range. Denies any symptom. Continue imdur 90 mg daily with clonidine bid, coreg and amlodipine current regimen. Reviewed BMP  ckd stage 4 Monitor BMP. Few elevated BP readings. Maintain hydration  Knee OA Currently on tylenol 650 mg bid with prn dosing in between. Change this to 650 mg tid with prn dosing.   Hyperlipidemia LDL at goal. Continue atorvastatin 10 mg daily.    Family/ staff Communication: reviewed care plan with patient and charge nurse.    Labs/tests ordered:  none   [name], MD Internal Medicine [facility] [address] [place], [place] [address] Cell Phone (Monday-Friday 8 am - 5 pm): [phone] On Call: [phone] and follow prompts after 5 pm and on weekends Office Phone: [phone] Office Fax: [phone]

## 2017-04-18 ENCOUNTER — Ambulatory Visit
Admission: RE | Admit: 2017-04-18 | Discharge: 2017-04-18 | Disposition: A | Discharge status: Home / Self Care | Payer: Medicare Other | Source: Ambulatory Visit | Attending: Radiation Oncology | Admitting: Radiation Oncology

## 2017-04-18 ENCOUNTER — Encounter: Payer: Self-pay | Admitting: Radiation Oncology

## 2017-04-18 VITALS — BP 151/59 | HR 70 | Temp 98.4°F

## 2017-04-18 DIAGNOSIS — Z79899 Other long term (current) drug therapy: Secondary | ICD-10-CM | POA: Diagnosis not present

## 2017-04-18 DIAGNOSIS — C50111 Malignant neoplasm of central portion of right female breast: Secondary | ICD-10-CM | POA: Insufficient documentation

## 2017-04-18 DIAGNOSIS — Z882 Allergy status to sulfonamides status: Secondary | ICD-10-CM | POA: Diagnosis not present

## 2017-04-18 DIAGNOSIS — Z171 Estrogen receptor negative status [ER-]: Secondary | ICD-10-CM | POA: Diagnosis not present

## 2017-04-18 NOTE — Progress Notes (Signed)
[name] presents for follow up of radiation completed 03/14/17 to her Right Breast. She denies pain or fatigue. Her Right Breast is red. She tells me that the facility where she lives is not applying cream to her Right Breast at this time. She last saw Dr. Burr Medico on 03/31/17 and her next appointment is 06/30/17  BP (!) 151/59   Pulse 70   Temp 98.4 F (36.9 C)   SpO2 98% Comment: room air

## 2017-04-18 NOTE — Progress Notes (Signed)
Radiation Oncology         [phone] ________________________________  Name: [name] MRN: [id number]  Date: 04/18/2017  DOB: 1925/04/07  Follow-Up Visit Note  Outpatient  CC: [name], MD  Fanny Skates, MD  Diagnosis and Prior Radiotherapy:    ICD-10-CM   1. Malignant neoplasm of central portion of right breast in female, estrogen receptor negative ([place]) C50.111    Z17.1     StageT3N1M0, RightBreast, 12 o'clock,Invasive Ductal Carcinoma, ER(NEG)/ PR (NEG)/ Her2 (NEG), Grade2   Previous Radiation:   Radiation treatment dates:   02/13/17 - 03/14/17 Site/dose:   1) Right Breast / 42.56 Gy in 16 fractions 2) Right Breast Boost / 12.5 Gy in 5 fractions  CHIEF COMPLAINT: Here for follow-up and surveillance of right breast cancer  Narrative:  The patient returns today for routine follow-up.  She denies fatigue. She does report occasional pain in her breast. Her right breast is red. She states that the facility where she lives is not applying cream to her right breast at this time. She last saw Dr. Burr Medico on 03/31/17 and her next appointment is 06/30/17.                               ALLERGIES:  is allergic to sulfa antibiotics.  Meds: Current Outpatient Medications  Medication Sig Dispense Refill  . acetaminophen (TYLENOL) 325 MG tablet Take 650 mg by mouth 2 (two) times daily. Take 2 tablets every 6 hours as needed for mild pain or fever    . amLODipine (NORVASC) 2.5 MG tablet Take 2.5 mg by mouth daily.    Marland Kitchen atorvastatin (LIPITOR) 10 MG tablet Take 10 mg by mouth daily.    Marland Kitchen BETIMOL 0.5 % ophthalmic solution Place 1 drop daily into both eyes.     . carvedilol (COREG) 12.5 MG tablet Take 12.5 mg by mouth 2 (two) times daily with a meal.    . cloNIDine (CATAPRES) 0.1 MG tablet Take 0.1 mg by mouth 2 (two) times daily.    . Coenzyme Q10 50 MG TABS Take one tablet by mouth at bedtime    . famotidine (PEPCID) 20 MG tablet Take 20 mg by mouth at bedtime.     .  folic acid (FOLVITE) 1 MG tablet Take 1 mg by mouth daily.    . Hypromellose (ARTIFICIAL TEARS OP) Place 2 drops 2 (two) times daily into both eyes.     . isosorbide mononitrate (IMDUR) 60 MG 24 hr tablet Take 90 mg by mouth at bedtime.     Marland Kitchen KLOR-CON 10 10 MEQ tablet Take 10 mEq by mouth daily.    Marland Kitchen latanoprost (XALATAN) 0.005 % ophthalmic solution Place 1 drop into both eyes at bedtime.     . torsemide (DEMADEX) 10 MG tablet Take 10 mg daily by mouth. Hold for SBP < 110    . vitamin B-12 (CYANOCOBALAMIN) 1000 MCG tablet Take 1,000 mcg See admin instructions by mouth. Take 1000 mcg by mouth daily On Mondays,Wednesdays and Fridays     No current facility-administered medications for this encounter.     Physical Findings: The patient is in no acute distress. Patient is alert and oriented.  temperature is 98.4 F (36.9 C). Her blood pressure is 151/59 (abnormal) and her pulse is 70. Her oxygen saturation is 98%. .    Presents in wheelchair. Accompanied by home health aid. Satisfactory skin healing in radiotherapy fields. Right breast  is slightly erythematous with some crusting at the nipple. In the central 12 to 1 o'clock position of the right breast, she still has a palpable mass that is about 3.5 cm in greatest dimension, but the breast has markedly improved and there is no longer a bulging mass as she had before starting radiation.    Lab Findings: Lab Results  Component Value Date   WBC 4.9 03/31/2017   HGB 10.9 (L) 03/31/2017   HCT 33.7 (L) 03/31/2017   MCV 90.3 03/31/2017   PLT 190 03/31/2017    Radiographic Findings: No results found.  Impression/Plan: Healing well from radiotherapy to the breast tissue. Good palliative result  Continue skin care with topical Vitamin E  lotion /vaseline for at least 2 more months for further healing. These instructions were written on the patient's paperwork to take back to her skilled-living facility.  I encouraged her to continuefollowup  with medical oncology. I will see her back on an as-needed basis. I have encouraged her to call if she has any issues or concerns in the future. I wished her the very best. _____________________________________   Eppie Gibson, MD   This document serves as a record of services personally performed by Eppie Gibson, MD. It was created on her behalf by Arlyce Harman, a trained medical scribe. The creation of this record is based on the scribe's personal observations and the provider's statements to them. This document has been checked and approved by the attending provider.

## 2017-04-29 DIAGNOSIS — E785 Hyperlipidemia, unspecified: Secondary | ICD-10-CM | POA: Diagnosis not present

## 2017-04-30 LAB — LIPID PANEL
Cholesterol: 110 (ref 0–200)
HDL: 41 (ref 35–70)
LDL CALC: 50
LDl/HDL Ratio: 2.7
TRIGLYCERIDES: 111 (ref 40–160)

## 2017-05-01 ENCOUNTER — Other Ambulatory Visit: Payer: Self-pay | Admitting: *Deleted

## 2017-05-06 DIAGNOSIS — I350 Nonrheumatic aortic (valve) stenosis: Secondary | ICD-10-CM | POA: Diagnosis not present

## 2017-05-06 DIAGNOSIS — Z01810 Encounter for preprocedural cardiovascular examination: Secondary | ICD-10-CM | POA: Diagnosis not present

## 2017-05-06 DIAGNOSIS — C50211 Malignant neoplasm of upper-inner quadrant of right female breast: Secondary | ICD-10-CM | POA: Diagnosis not present

## 2017-05-06 DIAGNOSIS — I10 Essential (primary) hypertension: Secondary | ICD-10-CM | POA: Diagnosis not present

## 2017-05-14 ENCOUNTER — Non-Acute Institutional Stay (SKILLED_NURSING_FACILITY): Payer: Medicare Other | Admitting: Nurse Practitioner

## 2017-05-14 ENCOUNTER — Encounter: Payer: Self-pay | Admitting: *Deleted

## 2017-05-14 DIAGNOSIS — R413 Other amnesia: Secondary | ICD-10-CM | POA: Diagnosis not present

## 2017-05-14 DIAGNOSIS — I5032 Chronic diastolic (congestive) heart failure: Secondary | ICD-10-CM

## 2017-05-14 DIAGNOSIS — K219 Gastro-esophageal reflux disease without esophagitis: Secondary | ICD-10-CM

## 2017-05-14 DIAGNOSIS — D519 Vitamin B12 deficiency anemia, unspecified: Secondary | ICD-10-CM

## 2017-05-14 DIAGNOSIS — I10 Essential (primary) hypertension: Secondary | ICD-10-CM

## 2017-05-14 NOTE — Assessment & Plan Note (Signed)
CHF compensated clinically, continue Torsemide 10mg  daily.

## 2017-05-14 NOTE — Assessment & Plan Note (Signed)
history of mild memory difficulty, last MMSE28/30 02/04/17, resides in [facility] one person transfer, wheelchair to get around.

## 2017-05-14 NOTE — Progress Notes (Signed)
Location:  [place] Room Number: 5 Place of Service:  SNF (31) Provider:  Mast, Manxie  NP  [name], MD  Patient Care Team: [name], MD as PCP - General (Internal Medicine) Juanita Craver, MD as Consulting Physician (Gastroenterology) Shon Hough, MD as Consulting Physician (Ophthalmology) Thornell Sartorius, MD as Consulting Physician (Otolaryngology) [place] Mast, Man X, NP as Nurse Practitioner (Nurse Practitioner) Bo Merino, MD as Consulting Physician (Rheumatology) Suella Broad, MD as Consulting Physician (Physical Medicine and Rehabilitation) Netta Cedars, MD as Consulting Physician (Orthopedic Surgery) Fanny Skates, MD as Consulting Physician (General Surgery) Truitt Merle, MD as Consulting Physician (Hematology) Thea Silversmith, MD (Inactive) as Consulting Physician (Radiation Oncology) Rockwell Germany, RN as Registered Nurse Mauro Kaufmann, RN as Registered Nurse Jake Shark, Johny Blamer, NP as Nurse Practitioner (Nurse Practitioner) [name], MD as Consulting Physician (Cardiology)  Extended Emergency Contact Information Primary Emergency Contact: [name],[name] Address: [address]          [place] [address] Johnnette Litter of [place] Phone: [phone] Mobile Phone: [phone] Relation: Daughter Secondary Emergency Contact: [name],[name]  [place] Mobile Phone: [phone] Relation: None  Code Status:  DNR Goals of care: Advanced Directive information Advanced Directives 05/14/2017  Does Patient Have a Medical Advance Directive? Yes  Type of Advance Directive Out of facility DNR (pink MOST or yellow form)  Does patient want to make changes to medical advance directive? No - Patient declined  Copy of [place] in Chart? -  Pre-existing out of facility DNR order (yellow form or pink MOST form) Yellow form placed in chart (order not  valid for inpatient use);Pink MOST form placed in chart (order not valid for inpatient use)     Chief Complaint  Patient presents with  . Medical Management of Chronic Issues    F/U chronic CHF    HPI:  Pt is a 84 y.o. female seen today for medical management of chronic diseases.    The patient has history of mild memory difficulty, last MMSE28/30 02/04/17, resides in [facility], one person transfer, wheelchair to get around. Hx of HTN, blood pressures is controlled on Isosorbide 90mg  qd, Clonidine 0.1mg  bid, Carvedilol 12.5mg  bid, Amlodipine 2.5mg  qd. GERD stable on Famotidine 20mg  qd. CHF compensated clinically on Torsemide 10mg  daily. Anemia, stable on Vit B12, Folate 1mg  qd, last Hgb 10.9 03/31/17  Past Medical History:  Diagnosis Date  . Arthritis    Osteoarthritis  . Cancer of left breast [facility]) August 2016  . Cerebral atherosclerosis 12/19/2012  . Cerebral atrophy 12/19/2012  . Cerebral ischemia 12/19/2012   Microvascular   . CKD (chronic kidney disease) stage 3, GFR 30-59 ml/min (HCC) 11/03/2014  . Cough   . Dementia   . Diverticulosis of colon (without mention of hemorrhage)   . Edema   . Family history of breast cancer   . Flat feet   . GERD (gastroesophageal reflux disease)   . Glaucoma   . Heart murmur   . Hyperlipidemia   . Hypertension   . Hypertonicity of bladder   . Ischemic cardiomyopathy 11/04/2014  . Lumbago   . Memory change 03/28/2016  . Osteoarthrosis, unspecified whether generalized or localized, unspecified site   . Other malaise and fatigue   . Pain in joint, pelvic region and thigh   . Pain in joint, shoulder region   . Personal history of fall   . RLQ abdominal pain 01/17/2011  . Senile osteoporosis   .  Small bowel obstruction ([place])   . STEMI (ST elevation myocardial infarction) ([place]) 11/04/2014  . Unstable gait 11/04/2014  . Urine, incontinence, stress female 09/18/2014   Past Surgical History:  Procedure Laterality Date  . BREAST LUMPECTOMY  Bilateral   . BREAST LUMPECTOMY WITH RADIOACTIVE SEED LOCALIZATION Left 03/15/2015   Procedure: LEFT BREAST LUMPECTOMY WITH RADIOACTIVE SEED LOCALIZATION;  Surgeon: Fanny Skates, MD;  Location: [place];  Service: General;  Laterality: Left;  . CARDIAC CATHETERIZATION N/A 11/08/2014   Procedure: Left Heart Cath and Coronary Angiography;  Surgeon: [name], MD;  Location: [place] CV LAB;  Service: Cardiovascular;  Laterality: N/A;  . COLON SURGERY  02/1996   Colectomy, partial for diverticular bleed 90%  . EYE SURGERY    . JOINT REPLACEMENT Left 12/2000   Left knee; Dr. Wynelle Link  . LAPAROSCOPIC LYSIS OF ADHESIONS  11/15/10   Exploratory  . TONSILLECTOMY      Allergies  Allergen Reactions  . Sulfa Antibiotics Itching    Outpatient Encounter Medications as of 05/14/2017  Medication Sig  . acetaminophen (TYLENOL) 325 MG tablet Take 650 mg by mouth 2 (two) times daily. Take 2 tablets every 6 hours as needed for mild pain or fever  . amLODipine (NORVASC) 2.5 MG tablet Take 2.5 mg by mouth daily.  Marland Kitchen atorvastatin (LIPITOR) 10 MG tablet Take 10 mg by mouth daily.  . carvedilol (COREG) 12.5 MG tablet Take 12.5 mg by mouth 2 (two) times daily with a meal.  . cloNIDine (CATAPRES) 0.1 MG tablet Take 0.1 mg by mouth 2 (two) times daily.  . Coenzyme Q10 50 MG TABS Take one tablet by mouth at bedtime  . famotidine (PEPCID) 20 MG tablet Take 20 mg by mouth at bedtime.   . folic acid (FOLVITE) 1 MG tablet Take 1 mg by mouth daily.  . Hypromellose (ARTIFICIAL TEARS OP) Place 2 drops 2 (two) times daily into both eyes.   . isosorbide mononitrate (IMDUR) 60 MG 24 hr tablet Take 90 mg by mouth at bedtime.   Marland Kitchen KLOR-CON 10 10 MEQ tablet Take 10 mEq by mouth daily.  Marland Kitchen latanoprost (XALATAN) 0.005 % ophthalmic solution Place 1 drop into both eyes at bedtime.   . timolol (BETIMOL) 0.5 % ophthalmic solution Place 1 drop into both eyes daily.  Marland Kitchen torsemide (DEMADEX) 10 MG tablet Take 10 mg daily by mouth. Hold  for SBP < 110  . vitamin B-12 (CYANOCOBALAMIN) 1000 MCG tablet Take 1,000 mcg See admin instructions by mouth. Take 1000 mcg by mouth daily On Mondays,Wednesdays and Fridays  . [DISCONTINUED] BETIMOL 0.5 % ophthalmic solution Place 1 drop daily into both eyes.    No facility-administered encounter medications on file as of 05/14/2017.    ROS was provided with assistance of staff Review of Systems  Constitutional: Negative for activity change, appetite change, chills, diaphoresis and fatigue.  HENT: Positive for hearing loss. Negative for congestion, trouble swallowing and voice change.   Respiratory: Negative for cough, chest tightness, shortness of breath and wheezing.   Cardiovascular: Negative for chest pain, palpitations and leg swelling.  Gastrointestinal: Negative for abdominal distention, abdominal pain, constipation, diarrhea, nausea and vomiting.  Genitourinary: Negative for difficulty urinating, dysuria and urgency.       Incontinent of urine sometimes.   Musculoskeletal: Positive for arthralgias and gait problem.  Skin: Negative for color change and pallor.  Neurological: Negative for speech difficulty, weakness and headaches.       Memory lapses.   Psychiatric/Behavioral: Negative for  agitation, behavioral problems, hallucinations and sleep disturbance. The patient is not nervous/anxious.        Memory recall lapses.     Immunization History  Administered Date(s) Administered  . Influenza Whole 11/19/2011, 12/02/2012  . Influenza-Unspecified 12/06/2013, 12/06/2015, 12/02/2016  . PPD Test 11/10/2014  . Pneumococcal Conjugate-13 03/09/2009  . Pneumococcal Polysaccharide-23 02/18/2009  . Tdap 04/02/2016   Pertinent  Health Maintenance Due  Topic Date Due  . MAMMOGRAM  11/26/2017  . INFLUENZA VACCINE  Completed  . DEXA SCAN  Completed  . PNA vac Low Risk Adult  Completed   Fall Risk  02/05/2017 01/31/2017 07/13/2015 11/17/2014 04/21/2014  Falls in the past year? No No No  Yes Yes  Number falls in past yr: - - - 1 1  Injury with Fall? - - - No No  Comment - - - - -  Risk for fall due to : - History of fall(s);Impaired balance/gait;Impaired vision - Impaired balance/gait;History of fall(s) -   Functional Status Survey:    Vitals:   05/14/17 1200  BP: 136/80  Pulse: 70  Resp: 18  Temp: (!) 96.4 F (35.8 C)  SpO2: 97%  Weight: 149 lb (67.6 kg)  Height: 5\' 5"  (1.651 m)   Body mass index is 24.79 kg/m. Physical Exam  Constitutional: She appears well-developed and well-nourished.  HENT:  Head: Normocephalic and atraumatic.  Eyes: Pupils are equal, round, and reactive to light. Conjunctivae and EOM are normal.  Neck: Normal range of motion. Neck supple. No JVD present. No thyromegaly present.  Cardiovascular: Regular rhythm.  Murmur heard. Pulmonary/Chest: Effort normal and breath sounds normal. She has no wheezes. She has no rales.  Abdominal: Soft. Bowel sounds are normal. She exhibits no distension. There is no tenderness.  Musculoskeletal: She exhibits tenderness. She exhibits no edema.  One person transfer, wheelchair to get around. Right knee pain, limited over head ROM of R+L shoulders.   Neurological: She is alert. She exhibits normal muscle tone. Coordination normal.  Oriented to person and place.   Skin: Skin is warm and dry.  Right breast cancer.     Labs reviewed: Recent Labs    08/25/16 0105 08/26/16 0427  12/26/16 03/11/17 03/31/17 0836  NA 138 138   < > 139 138 139  K 3.4* 4.0   < > 4.2 4.0 4.0  CL 106 105  --   --   --  106  CO2 22 21*  --   --   --  23  GLUCOSE 161* 137*  --   --   --  97  BUN 41* 64*   < > 34* 33* 25  CREATININE 2.20* 2.90*   < > 2.6* 2.3* 2.22*  CALCIUM 8.5* 8.2*  --   --   --  9.3  MG 1.9  --   --   --   --   --    < > = values in this interval not displayed.   Recent Labs    08/24/16 1053 12/10/16 03/11/17 03/31/17 0836  AST 17 12* 12* 15  ALT 9* 5* 4* <6  ALKPHOS 42 37 39 53  BILITOT 0.9   --   --  0.5  PROT 7.1  --   --  7.1  ALBUMIN 3.8  --   --  3.8   Recent Labs    08/24/16 1053 08/25/16 0105 08/26/16 0427  01/14/17 03/11/17 03/31/17 0836  WBC 10.8* 19.8* 12.9*   < > 6.5 6.4  4.9  NEUTROABS 8.2*  --   --   --   --   --  3.3  HGB 12.2 11.1* 10.0*   < > 9.3* 9.8* 10.9*  HCT 36.3 33.0* 30.6*   < > 28* 30* 33.7*  MCV 89.9 88.2 86.4  --   --   --  90.3  PLT 186 190 197   < > 197 214 190   < > = values in this interval not displayed.   Lab Results  Component Value Date   TSH 2.21 04/04/2016   Lab Results  Component Value Date   HGBA1C 5.6 11/06/2014   Lab Results  Component Value Date   CHOL 110 04/30/2017   HDL 41 04/30/2017   LDLCALC 50 04/30/2017   TRIG 111 04/30/2017   CHOLHDL 3.1 11/06/2014    Significant Diagnostic Results in last 30 days:  No results found.  Assessment/Plan Memory change history of mild memory difficulty, last MMSE28/30 02/04/17, resides in [facility], one person transfer, wheelchair to get around.    Anemia Anemia, stable, continue Vit B12, Folate 1mg  qd, last Hgb 10.9 03/31/17   Accelerated hypertension Hx of HTN, blood pressures is controlled, continue Isosorbide 90mg  qd, Clonidine 0.1mg  bid, Carvedilol 12.5mg  bid, Amlodipine 2.5mg  qd.  Chronic diastolic congestive heart failure ([place]) CHF compensated clinically, continue Torsemide 10mg  daily.  GERD (gastroesophageal reflux disease) GERD stable, continue Famotidine 20mg  qd.     Family/ staff Communication: plan of care reviewed with the patient and charge nurse.   Labs/tests ordered:  none  Time spend 25 minutes.

## 2017-05-14 NOTE — Assessment & Plan Note (Signed)
GERD stable, continue Famotidine 20mg  qd.

## 2017-05-14 NOTE — Assessment & Plan Note (Signed)
Hx of HTN, blood pressures is controlled, continue Isosorbide 90mg  qd, Clonidine 0.1mg  bid, Carvedilol 12.5mg  bid, Amlodipine 2.5mg  qd.

## 2017-05-14 NOTE — Assessment & Plan Note (Signed)
Anemia, stable, continue Vit B12, Folate 1mg  qd, last Hgb 10.9 03/31/17

## 2017-05-21 ENCOUNTER — Non-Acute Institutional Stay (SKILLED_NURSING_FACILITY): Payer: Medicare Other | Admitting: Nurse Practitioner

## 2017-05-21 ENCOUNTER — Encounter: Payer: Self-pay | Admitting: Nurse Practitioner

## 2017-05-21 DIAGNOSIS — R410 Disorientation, unspecified: Secondary | ICD-10-CM

## 2017-05-21 DIAGNOSIS — I10 Essential (primary) hypertension: Secondary | ICD-10-CM

## 2017-05-21 NOTE — Progress Notes (Signed)
Location:  [place] Room Number: 5 Place of Service:  SNF (31) Provider:  Jeraldean Wechter, Manxie  NP  [name], MD  Patient Care Team: [name], MD as PCP - General (Internal Medicine) Juanita Craver, MD as Consulting Physician (Gastroenterology) Shon Hough, MD as Consulting Physician (Ophthalmology) Thornell Sartorius, MD as Consulting Physician (Otolaryngology) [place] Dondra Rhett X, NP as Nurse Practitioner (Nurse Practitioner) Bo Merino, MD as Consulting Physician (Rheumatology) Suella Broad, MD as Consulting Physician (Physical Medicine and Rehabilitation) Netta Cedars, MD as Consulting Physician (Orthopedic Surgery) Fanny Skates, MD as Consulting Physician (General Surgery) Truitt Merle, MD as Consulting Physician (Hematology) Thea Silversmith, MD (Inactive) as Consulting Physician (Radiation Oncology) Rockwell Germany, RN as Registered Nurse Mauro Kaufmann, RN as Registered Nurse Jake Shark, Johny Blamer, NP as Nurse Practitioner (Nurse Practitioner) [name], MD as Consulting Physician (Cardiology)  Extended Emergency Contact Information Primary Emergency Contact: [name],[name] Address: [address]          [place] [address] [place] Phone: [phone] Mobile Phone: [phone] Relation: Daughter Secondary Emergency Contact: [name],[name]  [place] Mobile Phone: [phone] Relation: None  Code Status:  DNR Goals of care: Advanced Directive information Advanced Directives 05/14/2017  Does Patient Have a Medical Advance Directive? Yes  Type of Advance Directive Out of facility DNR (pink MOST or yellow form)  Does patient want to make changes to medical advance directive? No - Patient declined  Copy of [place] in Chart? -  Pre-existing out of facility DNR order (yellow form or pink MOST form) Yellow form placed in chart (order not  valid for inpatient use);Pink MOST form placed in chart (order not valid for inpatient use)     Chief Complaint  Patient presents with  . Acute Visit    Increased confusion    HPI:  Pt is a 84 y.o. female seen today for an acute visit for increased confusion, exit seeking, attempting to exit building from dinning room doors, able to be redirected.  History of lapsed memory, she resides in [facility], one person transfer, wheelchair to get around. Hx of HTN, blood pressure is controlled on Amlodipine, Carvedilol, Clonidine, Isosorbide, Torsemide. Hx of cerebral atrophy and acute metabolic encephalopathy.    Past Medical History:  Diagnosis Date  . Arthritis    Osteoarthritis  . Cancer of left breast [facility]) August 2016  . Cerebral atherosclerosis 12/19/2012  . Cerebral atrophy 12/19/2012  . Cerebral ischemia 12/19/2012   Microvascular   . CKD (chronic kidney disease) stage 3, GFR 30-59 ml/min (HCC) 11/03/2014  . Cough   . Dementia   . Diverticulosis of colon (without mention of hemorrhage)   . Edema   . Family history of breast cancer   . Flat feet   . GERD (gastroesophageal reflux disease)   . Glaucoma   . Heart murmur   . Hyperlipidemia   . Hypertension   . Hypertonicity of bladder   . Ischemic cardiomyopathy 11/04/2014  . Lumbago   . Memory change 03/28/2016  . Osteoarthrosis, unspecified whether generalized or localized, unspecified site   . Other malaise and fatigue   . Pain in joint, pelvic region and thigh   . Pain in joint, shoulder region   . Personal history of fall   . RLQ abdominal pain 01/17/2011  . Senile osteoporosis   . Small bowel obstruction ([place])   . STEMI (ST elevation myocardial infarction) ([place]) 11/04/2014  . Unstable  gait 11/04/2014  . Urine, incontinence, stress female 09/18/2014   Past Surgical History:  Procedure Laterality Date  . BREAST LUMPECTOMY Bilateral   . BREAST LUMPECTOMY WITH RADIOACTIVE SEED LOCALIZATION Left 03/15/2015   Procedure: LEFT  BREAST LUMPECTOMY WITH RADIOACTIVE SEED LOCALIZATION;  Surgeon: Fanny Skates, MD;  Location: [place];  Service: General;  Laterality: Left;  . CARDIAC CATHETERIZATION N/A 11/08/2014   Procedure: Left Heart Cath and Coronary Angiography;  Surgeon: [name], MD;  Location: [place] CV LAB;  Service: Cardiovascular;  Laterality: N/A;  . COLON SURGERY  02/1996   Colectomy, partial for diverticular bleed 90%  . EYE SURGERY    . JOINT REPLACEMENT Left 12/2000   Left knee; Dr. Wynelle Link  . LAPAROSCOPIC LYSIS OF ADHESIONS  11/15/10   Exploratory  . TONSILLECTOMY      Allergies  Allergen Reactions  . Sulfa Antibiotics Itching    Outpatient Encounter Medications as of 05/21/2017  Medication Sig  . acetaminophen (TYLENOL) 325 MG tablet Take 650 mg by mouth 2 (two) times daily. Take 2 tablets every 6 hours as needed for mild pain or fever  . amLODipine (NORVASC) 2.5 MG tablet Take 2.5 mg by mouth daily.  Marland Kitchen atorvastatin (LIPITOR) 10 MG tablet Take 10 mg by mouth daily.  . carvedilol (COREG) 12.5 MG tablet Take 12.5 mg by mouth 2 (two) times daily with a meal.  . cloNIDine (CATAPRES) 0.1 MG tablet Take 0.1 mg by mouth 2 (two) times daily.  . Coenzyme Q10 50 MG TABS Take one tablet by mouth at bedtime  . famotidine (PEPCID) 20 MG tablet Take 20 mg by mouth at bedtime.   . folic acid (FOLVITE) 1 MG tablet Take 1 mg by mouth daily.  . hydrocortisone cream 1 % Apply 1 application topically 2 (two) times daily.  . Hypromellose (ARTIFICIAL TEARS OP) Place 2 drops 2 (two) times daily into both eyes.   . isosorbide mononitrate (IMDUR) 60 MG 24 hr tablet Take 90 mg by mouth at bedtime.   Marland Kitchen KLOR-CON 10 10 MEQ tablet Take 10 mEq by mouth daily.  Marland Kitchen latanoprost (XALATAN) 0.005 % ophthalmic solution Place 1 drop into both eyes at bedtime.   . timolol (BETIMOL) 0.5 % ophthalmic solution Place 1 drop into both eyes daily.  Marland Kitchen torsemide (DEMADEX) 10 MG tablet Take 10 mg daily by mouth. Hold for SBP < 110  .  vitamin B-12 (CYANOCOBALAMIN) 1000 MCG tablet Take 1,000 mcg See admin instructions by mouth. Take 1000 mcg by mouth daily On Mondays,Wednesdays and Fridays   No facility-administered encounter medications on file as of 05/21/2017.    ROS was provided with assistance of staff Review of Systems  Constitutional: Negative for activity change, appetite change, chills, diaphoresis, fatigue and fever.  HENT: Positive for hearing loss. Negative for trouble swallowing and voice change.   Respiratory: Negative for cough, shortness of breath and wheezing.   Cardiovascular: Positive for leg swelling. Negative for chest pain and palpitations.  Gastrointestinal: Negative for abdominal distention and abdominal pain.  Genitourinary: Negative for difficulty urinating, dysuria and urgency.  Musculoskeletal: Positive for arthralgias and gait problem.  Skin: Negative for color change and pallor.  Neurological: Negative for dizziness, speech difficulty, weakness and headaches.       Increased confusion.   Psychiatric/Behavioral: Positive for confusion. Negative for agitation, behavioral problems, hallucinations and sleep disturbance. The patient is not nervous/anxious.     Immunization History  Administered Date(s) Administered  . Influenza Whole 11/19/2011, 12/02/2012  .  Influenza-Unspecified 12/06/2013, 12/06/2015, 12/02/2016  . PPD Test 11/10/2014  . Pneumococcal Conjugate-13 03/09/2009  . Pneumococcal Polysaccharide-23 02/18/2009  . Tdap 04/02/2016   Pertinent  Health Maintenance Due  Topic Date Due  . INFLUENZA VACCINE  09/18/2017  . MAMMOGRAM  11/26/2017  . DEXA SCAN  Completed  . PNA vac Low Risk Adult  Completed   Fall Risk  02/05/2017 01/31/2017 07/13/2015 11/17/2014 04/21/2014  Falls in the past year? No No No Yes Yes  Number falls in past yr: - - - 1 1  Injury with Fall? - - - No No  Comment - - - - -  Risk for fall due to : - History of fall(s);Impaired balance/gait;Impaired vision -  Impaired balance/gait;History of fall(s) -   Functional Status Survey:    Vitals:   05/21/17 1324  BP: 126/72  Pulse: 80  Resp: 20  Temp: 98.8 F (37.1 C)  Weight: 152 lb 1.6 oz (69 kg)  Height: 5\' 5"  (1.651 m)   Body mass index is 25.31 kg/m. Physical Exam  Constitutional: She appears well-developed and well-nourished.  HENT:  Head: Normocephalic and atraumatic.  Eyes: Pupils are equal, round, and reactive to light. Conjunctivae and EOM are normal.  Neck: Normal range of motion. Neck supple. No JVD present. No thyromegaly present.  Cardiovascular: Normal rate and regular rhythm.  Murmur heard. Pulmonary/Chest: Effort normal and breath sounds normal. She has no wheezes. She has no rales.  Abdominal: Soft. Bowel sounds are normal.  Musculoskeletal: She exhibits edema and tenderness.  Chronic right knee pain. Wheelchair for mobility. Trace edema BLE  Neurological: She is alert. She exhibits normal muscle tone. Coordination normal.  Oriented to person and her room on unit.   Skin: Skin is warm and dry.  Psychiatric: She has a normal mood and affect. Her behavior is normal.    Labs reviewed: Recent Labs    08/25/16 0105 08/26/16 0427  12/26/16 03/11/17 03/31/17 0836  NA 138 138   < > 139 138 139  K 3.4* 4.0   < > 4.2 4.0 4.0  CL 106 105  --   --   --  106  CO2 22 21*  --   --   --  23  GLUCOSE 161* 137*  --   --   --  97  BUN 41* 64*   < > 34* 33* 25  CREATININE 2.20* 2.90*   < > 2.6* 2.3* 2.22*  CALCIUM 8.5* 8.2*  --   --   --  9.3  MG 1.9  --   --   --   --   --    < > = values in this interval not displayed.   Recent Labs    08/24/16 1053 12/10/16 03/11/17 03/31/17 0836  AST 17 12* 12* 15  ALT 9* 5* 4* <6  ALKPHOS 42 37 39 53  BILITOT 0.9  --   --  0.5  PROT 7.1  --   --  7.1  ALBUMIN 3.8  --   --  3.8   Recent Labs    08/24/16 1053 08/25/16 0105 08/26/16 0427  01/14/17 03/11/17 03/31/17 0836  WBC 10.8* 19.8* 12.9*   < > 6.5 6.4 4.9  NEUTROABS 8.2*   --   --   --   --   --  3.3  HGB 12.2 11.1* 10.0*   < > 9.3* 9.8* 10.9*  HCT 36.3 33.0* 30.6*   < > 28* 30* 33.7*  MCV  89.9 88.2 86.4  --   --   --  90.3  PLT 186 190 197   < > 197 214 190   < > = values in this interval not displayed.   Lab Results  Component Value Date   TSH 2.21 04/04/2016   Lab Results  Component Value Date   HGBA1C 5.6 11/06/2014   Lab Results  Component Value Date   CHOL 110 04/30/2017   HDL 41 04/30/2017   LDLCALC 50 04/30/2017   TRIG 111 04/30/2017   CHOLHDL 3.1 11/06/2014    Significant Diagnostic Results in last 30 days:  No results found.  Assessment/Plan Accelerated hypertension Hx of HTN, blood pressure is controlled, continue Amlodipine, Carvedilol, Clonidine, Isosorbide, Torsemide.     Confusion increased confusion. History of lapsed memory, she resides in [facility], one person transfer, wheelchair to get around.  Hx of cerebral atrophy and acute metabolic encephalopathy. Update CBC/diff, CMP, MMSE      Family/ staff Communication: plan of care reviewed with the patient and charge nurse.   Labs/tests ordered:  CBC/diff, CMP, MMSE  Time spend 25 minutes.

## 2017-05-21 NOTE — Assessment & Plan Note (Signed)
Hx of HTN, blood pressure is controlled, continue Amlodipine, Carvedilol, Clonidine, Isosorbide, Torsemide.

## 2017-05-21 NOTE — Assessment & Plan Note (Signed)
increased confusion. History of lapsed memory, she resides in [facility], one person transfer, wheelchair to get around.  Hx of cerebral atrophy and acute metabolic encephalopathy. Update CBC/diff, CMP, MMSE

## 2017-05-22 ENCOUNTER — Other Ambulatory Visit: Payer: Self-pay | Admitting: *Deleted

## 2017-05-22 DIAGNOSIS — I5032 Chronic diastolic (congestive) heart failure: Secondary | ICD-10-CM | POA: Diagnosis not present

## 2017-05-22 DIAGNOSIS — F329 Major depressive disorder, single episode, unspecified: Secondary | ICD-10-CM | POA: Diagnosis not present

## 2017-05-22 DIAGNOSIS — F0390 Unspecified dementia without behavioral disturbance: Secondary | ICD-10-CM | POA: Diagnosis not present

## 2017-05-22 DIAGNOSIS — I5043 Acute on chronic combined systolic (congestive) and diastolic (congestive) heart failure: Secondary | ICD-10-CM | POA: Diagnosis not present

## 2017-05-22 LAB — CBC AND DIFFERENTIAL
HEMATOCRIT: 27 — AB (ref 36–46)
HEMOGLOBIN: 9 — AB (ref 12.0–16.0)
Platelets: 217 (ref 150–399)
WBC: 5.9

## 2017-05-22 LAB — HEPATIC FUNCTION PANEL
ALK PHOS: 39 (ref 25–125)
ALT: 4 — AB (ref 7–35)
AST: 11 — AB (ref 13–35)
BILIRUBIN, TOTAL: 0.4

## 2017-05-22 LAB — BASIC METABOLIC PANEL
BUN: 31 — AB (ref 4–21)
Creatinine: 2.3 — AB (ref <=1.1)
Glucose: 87
Potassium: 4.3 (ref 3.4–5.3)
SODIUM: 140 (ref 137–147)

## 2017-05-27 ENCOUNTER — Other Ambulatory Visit: Payer: Self-pay | Admitting: *Deleted

## 2017-05-27 ENCOUNTER — Encounter: Payer: Self-pay | Admitting: *Deleted

## 2017-05-27 DIAGNOSIS — D649 Anemia, unspecified: Secondary | ICD-10-CM | POA: Diagnosis not present

## 2017-05-27 LAB — IRON,TIBC AND FERRITIN PANEL: IRON: 34

## 2017-05-27 LAB — CBC AND DIFFERENTIAL
HCT: 29 — AB (ref 36–46)
Hemoglobin: 9.3 — AB (ref 12.0–16.0)
Platelets: 228 (ref 150–399)
WBC: 7.1

## 2017-05-27 NOTE — Progress Notes (Signed)
Unable to do the clock test.

## 2017-06-06 ENCOUNTER — Encounter: Payer: Self-pay | Admitting: Nurse Practitioner

## 2017-06-06 ENCOUNTER — Non-Acute Institutional Stay (SKILLED_NURSING_FACILITY): Payer: Medicare Other | Admitting: Nurse Practitioner

## 2017-06-06 DIAGNOSIS — I10 Essential (primary) hypertension: Secondary | ICD-10-CM

## 2017-06-06 DIAGNOSIS — I110 Hypertensive heart disease with heart failure: Secondary | ICD-10-CM

## 2017-06-06 DIAGNOSIS — I5022 Chronic systolic (congestive) heart failure: Secondary | ICD-10-CM | POA: Diagnosis not present

## 2017-06-06 NOTE — Progress Notes (Signed)
Location:  SNF [place] Room Number: 5 Place of Service:  SNF (31)SNF [facility] Provider: [facility] Julieta Rogalski NP  [name], MD  Patient Care Team: [name], MD as PCP - General (Internal Medicine) Juanita Craver, MD as Consulting Physician (Gastroenterology) Shon Hough, MD as Consulting Physician (Ophthalmology) Thornell Sartorius, MD as Consulting Physician (Otolaryngology) [place] Frances X, NP as Nurse Practitioner (Nurse Practitioner) Bo Merino, MD as Consulting Physician (Rheumatology) Suella Broad, MD as Consulting Physician (Physical Medicine and Rehabilitation) Netta Cedars, MD as Consulting Physician (Orthopedic Surgery) Fanny Skates, MD as Consulting Physician (General Surgery) Truitt Merle, MD as Consulting Physician (Hematology) Thea Silversmith, MD (Inactive) as Consulting Physician (Radiation Oncology) Rockwell Germany, RN as Registered Nurse Mauro Kaufmann, RN as Registered Nurse Jake Shark, Johny Blamer, NP as Nurse Practitioner (Nurse Practitioner) [name], MD as Consulting Physician (Cardiology)  Extended Emergency Contact Information Primary Emergency Contact: [name],[name] Address: [address]          [place] [address] Johnnette Litter of [place] Phone: [phone] Mobile Phone: [phone] Relation: Daughter Secondary Emergency Contact: [name],[name]  [place] Mobile Phone: [phone] Relation: None  Code Status: DNR Goals of care: Advanced Directive information Advanced Directives 05/14/2017  Does Patient Have a Medical Advance Directive? Yes  Type of Advance Directive Out of facility DNR (pink MOST or yellow form)  Does patient want to make changes to medical advance directive? No - Patient declined  Copy of [place] in Chart? -  Pre-existing out of facility DNR order (yellow form or pink MOST form) Yellow form placed in chart (order not valid for  inpatient use);Pink MOST form placed in chart (order not valid for inpatient use)     Chief Complaint  Patient presents with  . Acute Visit    elevated blood pressure    HPI:  Pt is a 84 y.o. female seen today for an acute visit for blood pressure SBP in 180, DBP in 80-90 mmHg. The patient denied headache, dizziness, change of vision, chest pain/pressure, palpitation, nausea, vomiting, or abd pain. Hx of HTN, on Furosemide 10mg , Carvedilol 12.5mg  bid, Amlodipine 2.5mg  qd, Imdur 90mg  qd. Hx of CHF, compensated clinically.    Past Medical History:  Diagnosis Date  . Arthritis    Osteoarthritis  . Cancer of left breast [facility]) August 2016  . Cerebral atherosclerosis 12/19/2012  . Cerebral atrophy 12/19/2012  . Cerebral ischemia 12/19/2012   Microvascular   . CKD (chronic kidney disease) stage 3, GFR 30-59 ml/min (HCC) 11/03/2014  . Cough   . Dementia   . Diverticulosis of colon (without mention of hemorrhage)   . Edema   . Family history of breast cancer   . Flat feet   . GERD (gastroesophageal reflux disease)   . Glaucoma   . Heart murmur   . Hyperlipidemia   . Hypertension   . Hypertonicity of bladder   . Ischemic cardiomyopathy 11/04/2014  . Lumbago   . Memory change 03/28/2016  . Osteoarthrosis, unspecified whether generalized or localized, unspecified site   . Other malaise and fatigue   . Pain in joint, pelvic region and thigh   . Pain in joint, shoulder region   . Personal history of fall   . RLQ abdominal pain 01/17/2011  . Senile osteoporosis   . Small bowel obstruction ([place])   . STEMI (ST elevation myocardial infarction) ([place]) 11/04/2014  . Unstable gait 11/04/2014  . Urine, incontinence, stress female 09/18/2014  Past Surgical History:  Procedure Laterality Date  . BREAST LUMPECTOMY Bilateral   . BREAST LUMPECTOMY WITH RADIOACTIVE SEED LOCALIZATION Left 03/15/2015   Procedure: LEFT BREAST LUMPECTOMY WITH RADIOACTIVE SEED LOCALIZATION;  Surgeon: Fanny Skates,  MD;  Location: [place];  Service: General;  Laterality: Left;  . CARDIAC CATHETERIZATION N/A 11/08/2014   Procedure: Left Heart Cath and Coronary Angiography;  Surgeon: [name], MD;  Location: [place] CV LAB;  Service: Cardiovascular;  Laterality: N/A;  . COLON SURGERY  02/1996   Colectomy, partial for diverticular bleed 90%  . EYE SURGERY    . JOINT REPLACEMENT Left 12/2000   Left knee; Dr. Wynelle Link  . LAPAROSCOPIC LYSIS OF ADHESIONS  11/15/10   Exploratory  . TONSILLECTOMY      Allergies  Allergen Reactions  . Sulfa Antibiotics Itching    Allergies as of 06/06/2017      Reactions   Sulfa Antibiotics Itching      Medication List        Accurate as of 06/06/17 11:58 AM. Always use your most recent med list.          acetaminophen 325 MG tablet Commonly known as:  TYLENOL Take 650 mg by mouth 2 (two) times daily. Take 2 tablets every 6 hours as needed for mild pain or fever   amLODipine 2.5 MG tablet Commonly known as:  NORVASC Take 2.5 mg by mouth daily.   ARTIFICIAL TEARS OP Place 2 drops 2 (two) times daily into both eyes.   atorvastatin 10 MG tablet Commonly known as:  LIPITOR Take 10 mg by mouth daily.   carvedilol 12.5 MG tablet Commonly known as:  COREG Take 12.5 mg by mouth 2 (two) times daily with a meal.   cloNIDine 0.1 MG tablet Commonly known as:  CATAPRES Take 0.1 mg by mouth 2 (two) times daily.   Coenzyme Q10 50 MG Tabs Take one tablet by mouth at bedtime   famotidine 20 MG tablet Commonly known as:  PEPCID Take 20 mg by mouth at bedtime.   folic acid 1 MG tablet Commonly known as:  FOLVITE Take 1 mg by mouth daily.   hydrocortisone cream 1 % Apply 1 application topically 2 (two) times daily.   isosorbide mononitrate 60 MG 24 hr tablet Commonly known as:  IMDUR Take 90 mg by mouth at bedtime.   KLOR-CON 10 10 MEQ tablet Generic drug:  potassium chloride Take 10 mEq by mouth daily.   latanoprost 0.005 % ophthalmic  solution Commonly known as:  XALATAN Place 1 drop into both eyes at bedtime.   timolol 0.5 % ophthalmic solution Commonly known as:  BETIMOL Place 1 drop into both eyes daily.   torsemide 10 MG tablet Commonly known as:  DEMADEX Take 10 mg daily by mouth. Hold for SBP < 110   vitamin B-12 1000 MCG tablet Commonly known as:  CYANOCOBALAMIN Take 1,000 mcg See admin instructions by mouth. Take 1000 mcg by mouth daily On Mondays,Wednesdays and Fridays      ROS was provided with assistance of staff Review of Systems  Constitutional: Negative for activity change, appetite change, chills, diaphoresis, fatigue and fever.  HENT: Positive for hearing loss. Negative for congestion, trouble swallowing and voice change.   Respiratory: Negative for cough, chest tightness, shortness of breath and wheezing.   Cardiovascular: Positive for leg swelling. Negative for chest pain and palpitations.  Gastrointestinal: Negative for abdominal distention, abdominal pain, constipation, diarrhea, nausea and vomiting.  Skin: Negative for color change  and pallor.  Neurological: Negative for dizziness, facial asymmetry, speech difficulty, weakness, light-headedness and headaches.       Memory recall difficulties.   Psychiatric/Behavioral: Negative for agitation, behavioral problems, confusion, hallucinations and sleep disturbance. The patient is not nervous/anxious.     Immunization History  Administered Date(s) Administered  . Influenza Whole 11/19/2011, 12/02/2012  . Influenza-Unspecified 12/06/2013, 12/06/2015, 12/02/2016  . PPD Test 11/10/2014  . Pneumococcal Conjugate-13 03/09/2009  . Pneumococcal Polysaccharide-23 02/18/2009  . Tdap 04/02/2016   Pertinent  Health Maintenance Due  Topic Date Due  . INFLUENZA VACCINE  09/18/2017  . MAMMOGRAM  11/26/2017  . DEXA SCAN  Completed  . PNA vac Low Risk Adult  Completed   Fall Risk  02/05/2017 01/31/2017 07/13/2015 11/17/2014 04/21/2014  Falls in the past  year? No No No Yes Yes  Number falls in past yr: - - - 1 1  Injury with Fall? - - - No No  Comment - - - - -  Risk for fall due to : - History of fall(s);Impaired balance/gait;Impaired vision - Impaired balance/gait;History of fall(s) -   Functional Status Survey:    There were no vitals filed for this visit. There is no height or weight on file to calculate BMI. Physical Exam  Constitutional: She appears well-developed and well-nourished.  HENT:  Head: Normocephalic and atraumatic.  Eyes: Pupils are equal, round, and reactive to light. EOM are normal.  Neck: Normal range of motion. Neck supple. No JVD present. No thyromegaly present.  Cardiovascular: Normal rate and regular rhythm.  Murmur heard. Pulmonary/Chest: Effort normal and breath sounds normal. She has no wheezes. She has no rales.  Abdominal: Soft. Bowel sounds are normal.  Musculoskeletal: She exhibits edema.  Trace edema BLE  Neurological: She is alert. No cranial nerve deficit. She exhibits normal muscle tone. Coordination normal.  Oriented to person and place.   Skin: Skin is warm and dry.  Psychiatric: She has a normal mood and affect. Her behavior is normal.    Labs reviewed: Recent Labs    08/25/16 0105 08/26/16 0427  03/11/17 03/31/17 0836 05/22/17  NA 138 138   < > 138 139 140  K 3.4* 4.0   < > 4.0 4.0 4.3  CL 106 105  --   --  106  --   CO2 22 21*  --   --  23  --   GLUCOSE 161* 137*  --   --  97  --   BUN 41* 64*   < > 33* 25 31*  CREATININE 2.20* 2.90*   < > 2.3* 2.22* 2.3*  CALCIUM 8.5* 8.2*  --   --  9.3  --   MG 1.9  --   --   --   --   --    < > = values in this interval not displayed.   Recent Labs    08/24/16 1053  03/11/17 03/31/17 0836 05/22/17  AST 17   < > 12* 15 11*  ALT 9*   < > 4* <6 4*  ALKPHOS 42   < > 39 53 39  BILITOT 0.9  --   --  0.5  --   PROT 7.1  --   --  7.1  --   ALBUMIN 3.8  --   --  3.8  --    < > = values in this interval not displayed.   Recent Labs     08/24/16 1053 08/25/16 0105 08/26/16 0427  03/31/17  3794 05/22/17 05/27/17  WBC 10.8* 19.8* 12.9*   < > 4.9 5.9 7.1  NEUTROABS 8.2*  --   --   --  3.3  --   --   HGB 12.2 11.1* 10.0*   < > 10.9* 9.0* 9.3*  HCT 36.3 33.0* 30.6*   < > 33.7* 27* 29*  MCV 89.9 88.2 86.4  --  90.3  --   --   PLT 186 190 197   < > 190 217 228   < > = values in this interval not displayed.   Lab Results  Component Value Date   TSH 2.21 04/04/2016   Lab Results  Component Value Date   HGBA1C 5.6 11/06/2014   Lab Results  Component Value Date   CHOL 110 04/30/2017   HDL 41 04/30/2017   LDLCALC 50 04/30/2017   TRIG 111 04/30/2017   CHOLHDL 3.1 11/06/2014    Significant Diagnostic Results in last 30 days:  No results found.  Assessment/Plan: Accelerated hypertension Elevated blood pressure, 182/80, 154//78, 182/80, 150/74, 182/86, 180/80, 184/90, 170/72, 190/100, 183/83. Increased Amlodipine 5mg /2.5mg  po qd. Prn Clonidine 0.1mg  daily prn for Sbp>180, Ddp>90 x 72hrs.  Continue Imdur 90mg  qd, Clonidine 0.1mg  bid, Carvedilol 12.5mg  bid, Furosemide 10mg . Monitor Bp/P q shift x 72hours.   Hypertensive heart disease with chronic systolic congestive heart failure (HCC) Clinically compensated, continue Furosemide 10mg  qd.     Family/ staff Communication: plan of care reviewed with the patient and charge nurse.   Labs/tests ordered: none  Time spend 25 minutes

## 2017-06-06 NOTE — Assessment & Plan Note (Addendum)
Elevated blood pressure, 182/80, 154//78, 182/80, 150/74, 182/86, 180/80, 184/90, 170/72, 190/100, 183/83. Increased Amlodipine 5mg /2.5mg  po qd. Prn Clonidine 0.1mg  daily prn for Sbp>180, Ddp>90 x 72hrs.  Continue Imdur 90mg  qd, Clonidine 0.1mg  bid, Carvedilol 12.5mg  bid, Furosemide 10mg . Monitor Bp/P q shift x 72hours.

## 2017-06-06 NOTE — Assessment & Plan Note (Signed)
Clinically compensated, continue Furosemide 10mg  qd.

## 2017-06-12 ENCOUNTER — Encounter: Payer: Self-pay | Admitting: Nurse Practitioner

## 2017-06-12 ENCOUNTER — Non-Acute Institutional Stay (SKILLED_NURSING_FACILITY): Payer: Medicare Other | Admitting: Nurse Practitioner

## 2017-06-12 DIAGNOSIS — I110 Hypertensive heart disease with heart failure: Secondary | ICD-10-CM

## 2017-06-12 DIAGNOSIS — D509 Iron deficiency anemia, unspecified: Secondary | ICD-10-CM | POA: Diagnosis not present

## 2017-06-12 DIAGNOSIS — M25561 Pain in right knee: Secondary | ICD-10-CM | POA: Diagnosis not present

## 2017-06-12 DIAGNOSIS — F0150 Vascular dementia without behavioral disturbance: Secondary | ICD-10-CM

## 2017-06-12 DIAGNOSIS — I10 Essential (primary) hypertension: Secondary | ICD-10-CM

## 2017-06-12 DIAGNOSIS — I5022 Chronic systolic (congestive) heart failure: Secondary | ICD-10-CM | POA: Diagnosis not present

## 2017-06-12 NOTE — Progress Notes (Signed)
Location:  [place] Room Number: 5 Place of Service:  SNF (31) Provider:  Eloni Darius, ManX[place]  NP   [name], MD  Patient Care Team: [name], MD as PCP - General (Internal Medicine) Juanita Craver, MD as Consulting Physician (Gastroenterology) Shon Hough, MD as Consulting Physician (Ophthalmology) Thornell Sartorius, MD as Consulting Physician (Otolaryngology) [place] Kedra Mcglade X, NP as Nurse Practitioner (Nurse Practitioner) Bo Merino, MD as Consulting Physician (Rheumatology) Suella Broad, MD as Consulting Physician (Physical Medicine and Rehabilitation) Netta Cedars, MD as Consulting Physician (Orthopedic Surgery) Fanny Skates, MD as Consulting Physician (General Surgery) Truitt Merle, MD as Consulting Physician (Hematology) Thea Silversmith, MD (Inactive) as Consulting Physician (Radiation Oncology) Rockwell Germany, RN as Registered Nurse Mauro Kaufmann, RN as Registered Nurse Jake Shark, Johny Blamer, NP as Nurse Practitioner (Nurse Practitioner) [name], MD as Consulting Physician (Cardiology)  Extended Emergency Contact Information Primary Emergency Contact: [name],[name] Address: [address]          [place] [address] Johnnette Litter of [place] Phone: [phone] Mobile Phone: [phone] Relation: Daughter Secondary Emergency Contact: [name],[name]  [place] Mobile Phone: [phone] Relation: None  Code Status:  DNR Goals of care: Advanced Directive information Advanced Directives 06/12/2017  Does Patient Have a Medical Advance Directive? Yes  Type of Advance Directive Out of facility DNR (pink MOST or yellow form)  Does patient want to make changes to medical advance directive? No - Patient declined  Copy of [place] in Chart? -  Pre-existing out of facility DNR order (yellow form or pink MOST form) Yellow form placed in chart (order not  valid for inpatient use)     Chief Complaint  Patient presents with  . Medical Management of Chronic Issues    F/U- HTN, CHF,    HPI:  Pt is a 84 y.o. female seen today for medical management of chronic diseases.     The patient has history of CHF, compensated clinically, trance edema BLE, on Torsemide 10mg  qd. Hypertensive heart disease, no angina since last visited, on Isosorbide 90mg  qd. HTN, blood pressure is controlled on Clonidine 0.1mg  bid, Carvedilol 12.5mg  bid, Amlodipine 5mg  qd. Knee pain is managed on Tylenol 650mg  bid. Anemia, on Fe 325mg  1d since 05/28/17, pending CBC    Past Medical History:  Diagnosis Date  . Arthritis    Osteoarthritis  . Cancer of left breast [facility]) August 2016  . Cerebral atherosclerosis 12/19/2012  . Cerebral atrophy 12/19/2012  . Cerebral ischemia 12/19/2012   Microvascular   . CKD (chronic kidney disease) stage 3, GFR 30-59 ml/min (HCC) 11/03/2014  . Cough   . Dementia   . Diverticulosis of colon (without mention of hemorrhage)   . Edema   . Family history of breast cancer   . Flat feet   . GERD (gastroesophageal reflux disease)   . Glaucoma   . Heart murmur   . Hyperlipidemia   . Hypertension   . Hypertonicity of bladder   . Ischemic cardiomyopathy 11/04/2014  . Lumbago   . Memory change 03/28/2016  . Osteoarthrosis, unspecified whether generalized or localized, unspecified site   . Other malaise and fatigue   . Pain in joint, pelvic region and thigh   . Pain in joint, shoulder region   . Personal history of fall   . RLQ abdominal pain 01/17/2011  . Senile osteoporosis   . Small bowel obstruction ([place])   . STEMI (ST elevation myocardial infarction) ([place])  11/04/2014  . Unstable gait 11/04/2014  . Urine, incontinence, stress female 09/18/2014   Past Surgical History:  Procedure Laterality Date  . BREAST LUMPECTOMY Bilateral   . BREAST LUMPECTOMY WITH RADIOACTIVE SEED LOCALIZATION Left 03/15/2015   Procedure: LEFT BREAST LUMPECTOMY  WITH RADIOACTIVE SEED LOCALIZATION;  Surgeon: Fanny Skates, MD;  Location: [place];  Service: General;  Laterality: Left;  . CARDIAC CATHETERIZATION N/A 11/08/2014   Procedure: Left Heart Cath and Coronary Angiography;  Surgeon: [name], MD;  Location: [place] CV LAB;  Service: Cardiovascular;  Laterality: N/A;  . COLON SURGERY  02/1996   Colectomy, partial for diverticular bleed 90%  . EYE SURGERY    . JOINT REPLACEMENT Left 12/2000   Left knee; Dr. Wynelle Link  . LAPAROSCOPIC LYSIS OF ADHESIONS  11/15/10   Exploratory  . TONSILLECTOMY      Allergies  Allergen Reactions  . Sulfa Antibiotics Itching    Outpatient Encounter Medications as of 06/12/2017  Medication Sig  . acetaminophen (TYLENOL) 325 MG tablet Take 650 mg by mouth 2 (two) times daily. Take 2 tablets every 6 hours as needed for mild pain or fever  . amLODipine (NORVASC) 2.5 MG tablet Take 5 mg by mouth daily.  Marland Kitchen atorvastatin (LIPITOR) 10 MG tablet Take 10 mg by mouth daily.  . carvedilol (COREG) 12.5 MG tablet Take 12.5 mg by mouth 2 (two) times daily with a meal.  . cloNIDine (CATAPRES) 0.1 MG tablet Take 0.1 mg by mouth 2 (two) times daily.  . Coenzyme Q10 50 MG TABS Take one tablet by mouth at bedtime  . famotidine (PEPCID) 20 MG tablet Take 20 mg by mouth at bedtime.   . ferrous sulfate 325 (65 FE) MG tablet Take 325 mg by mouth daily with breakfast.  . folic acid (FOLVITE) 1 MG tablet Take 1 mg by mouth daily.  . Hypromellose (ARTIFICIAL TEARS OP) Place 2 drops 2 (two) times daily into both eyes.   . isosorbide mononitrate (IMDUR) 60 MG 24 hr tablet Take 90 mg by mouth at bedtime.   Marland Kitchen KLOR-CON 10 10 MEQ tablet Take 10 mEq by mouth daily.  Marland Kitchen latanoprost (XALATAN) 0.005 % ophthalmic solution Place 1 drop into both eyes at bedtime.   . timolol (BETIMOL) 0.5 % ophthalmic solution Place 1 drop into both eyes daily.  Marland Kitchen torsemide (DEMADEX) 10 MG tablet Take 10 mg daily by mouth. Hold for SBP < 110  . vitamin B-12  (CYANOCOBALAMIN) 1000 MCG tablet Take 1,000 mcg See admin instructions by mouth. Take 1000 mcg by mouth daily On Mondays,Wednesdays and Fridays  . [DISCONTINUED] hydrocortisone cream 1 % Apply 1 application topically 2 (two) times daily.   No facility-administered encounter medications on file as of 06/12/2017.    ROS was provided with assistance of staff Review of Systems  Constitutional: Negative for activity change, appetite change, chills, diaphoresis, fatigue and fever.  HENT: Positive for hearing loss. Negative for congestion, trouble swallowing and voice change.   Respiratory: Negative for cough, chest tightness, shortness of breath and wheezing.   Cardiovascular: Positive for leg swelling. Negative for chest pain and palpitations.  Gastrointestinal: Negative for abdominal distention, abdominal pain, constipation, diarrhea, nausea and vomiting.  Genitourinary: Negative for difficulty urinating, dysuria and urgency.  Musculoskeletal: Positive for arthralgias and gait problem.  Skin: Negative for color change and pallor.  Neurological: Negative for speech difficulty, weakness and headaches.       Dementia  Psychiatric/Behavioral: Positive for confusion. Negative for agitation, behavioral problems, hallucinations  and sleep disturbance. The patient is not nervous/anxious.     Immunization History  Administered Date(s) Administered  . Influenza Whole 11/19/2011, 12/02/2012  . Influenza-Unspecified 12/06/2013, 12/06/2015, 12/02/2016  . PPD Test 11/10/2014  . Pneumococcal Conjugate-13 03/09/2009  . Pneumococcal Polysaccharide-23 02/18/2009  . Tdap 04/02/2016   Pertinent  Health Maintenance Due  Topic Date Due  . INFLUENZA VACCINE  09/18/2017  . MAMMOGRAM  11/26/2017  . DEXA SCAN  Completed  . PNA vac Low Risk Adult  Completed   Fall Risk  02/05/2017 01/31/2017 07/13/2015 11/17/2014 04/21/2014  Falls in the past year? No No No Yes Yes  Number falls in past yr: - - - 1 1  Injury with  Fall? - - - No No  Comment - - - - -  Risk for fall due to : - History of fall(s);Impaired balance/gait;Impaired vision - Impaired balance/gait;History of fall(s) -   Functional Status Survey:    Vitals:   06/12/17 1051  BP: 126/66  Pulse: 70  Resp: 18  Temp: (!) 97.2 F (36.2 C)  SpO2: 96%  Weight: 152 lb 1.6 oz (69 kg)  Height: 5\' 5"  (1.651 m)   Body mass index is 25.31 kg/m. Physical Exam  Constitutional: She appears well-developed and well-nourished.  HENT:  Head: Normocephalic and atraumatic.  Eyes: Pupils are equal, round, and reactive to light. EOM are normal.  Neck: Normal range of motion. Neck supple. No JVD present. No thyromegaly present.  Cardiovascular: Normal rate and regular rhythm.  Murmur heard. Pulmonary/Chest: Effort normal. She has no wheezes. She has no rales.  Abdominal: Soft. Bowel sounds are normal. She exhibits no distension. There is no tenderness.  Genitourinary: No vaginal discharge found.  Musculoskeletal: She exhibits edema and tenderness.  Chronic right knee pain with weight bearing and ROM. One person transfer, w/c for mobility. Limited over head ROM of R+L shoulders.   Neurological: She is alert. No cranial nerve deficit. She exhibits normal muscle tone. Coordination normal.  Oriented to person and her room on unit.   Skin: Skin is warm and dry. No pallor.  Psychiatric: She has a normal mood and affect. Her behavior is normal.    Labs reviewed: Recent Labs    08/25/16 0105 08/26/16 0427  03/11/17 03/31/17 0836 05/22/17  NA 138 138   < > 138 139 140  K 3.4* 4.0   < > 4.0 4.0 4.3  CL 106 105  --   --  106  --   CO2 22 21*  --   --  23  --   GLUCOSE 161* 137*  --   --  97  --   BUN 41* 64*   < > 33* 25 31*  CREATININE 2.20* 2.90*   < > 2.3* 2.22* 2.3*  CALCIUM 8.5* 8.2*  --   --  9.3  --   MG 1.9  --   --   --   --   --    < > = values in this interval not displayed.   Recent Labs    08/24/16 1053  03/11/17 03/31/17 0836  05/22/17  AST 17   < > 12* 15 11*  ALT 9*   < > 4* <6 4*  ALKPHOS 42   < > 39 53 39  BILITOT 0.9  --   --  0.5  --   PROT 7.1  --   --  7.1  --   ALBUMIN 3.8  --   --  3.8  --    < > = values in this interval not displayed.   Recent Labs    08/24/16 1053 08/25/16 0105 08/26/16 0427  03/31/17 0836 05/22/17 05/27/17  WBC 10.8* 19.8* 12.9*   < > 4.9 5.9 7.1  NEUTROABS 8.2*  --   --   --  3.3  --   --   HGB 12.2 11.1* 10.0*   < > 10.9* 9.0* 9.3*  HCT 36.3 33.0* 30.6*   < > 33.7* 27* 29*  MCV 89.9 88.2 86.4  --  90.3  --   --   PLT 186 190 197   < > 190 217 228   < > = values in this interval not displayed.   Lab Results  Component Value Date   TSH 2.21 04/04/2016   Lab Results  Component Value Date   HGBA1C 5.6 11/06/2014   Lab Results  Component Value Date   CHOL 110 04/30/2017   HDL 41 04/30/2017   LDLCALC 50 04/30/2017   TRIG 111 04/30/2017   CHOLHDL 3.1 11/06/2014    Significant Diagnostic Results in last 30 days:  No results found.  Assessment/Plan Hypertensive heart disease with chronic systolic congestive heart failure (HCC) CHF, compensated clinically, trance edema BLE, continue Torsemide 10mg  qd. No angina, continue Isosorbide 90mg  qd.   Accelerated hypertension HTN, blood pressure is controlled, continue Clonidine 0.1mg  bid, Carvedilol 12.5mg  bid, Amlodipine 5mg  qd.   Right knee pain Knee pain is managed, continue Tylenol 650mg  bid.   Anemia Anemia, continue Fe 325mg  1d since 05/28/17, last Iron level 32 05/27/17, pending CBC. Continue Vit S50, Folic acid.    Vascular dementia 05/22/17 MMSE 19/30, continue SNF [facility] for care needs. Continue to manage blood pressures, Atorvastatin for risk reduction.      Family/ staff Communication: plan of care reviewed with the patient and charge nurse.   Labs/tests ordered:  Pending CBC  Time spend 25 minutes.

## 2017-06-12 NOTE — Assessment & Plan Note (Signed)
HTN, blood pressure is controlled, continue Clonidine 0.1mg  bid, Carvedilol 12.5mg  bid, Amlodipine 5mg  qd.

## 2017-06-12 NOTE — Assessment & Plan Note (Addendum)
CHF, compensated clinically, trance edema BLE, continue Torsemide 10mg  qd. No angina, continue Isosorbide 90mg  qd.

## 2017-06-12 NOTE — Assessment & Plan Note (Addendum)
Anemia, continue Fe 325mg  1d since 05/28/17, last Iron level 32 05/27/17, pending CBC. Continue Vit Y29, Folic acid.

## 2017-06-12 NOTE — Assessment & Plan Note (Addendum)
05/22/17 MMSE 19/30, continue SNF [facility] for care needs. Continue to manage blood pressures, Atorvastatin for risk reduction.

## 2017-06-12 NOTE — Assessment & Plan Note (Signed)
Knee pain is managed, continue Tylenol 650mg  bid.

## 2017-06-26 DIAGNOSIS — D649 Anemia, unspecified: Secondary | ICD-10-CM | POA: Diagnosis not present

## 2017-06-26 NOTE — Progress Notes (Signed)
[place]  [phone] [phone]  Clinic Follow Up Note   Patient Care Team: [name], MD as PCP - General (Internal Medicine) Juanita Craver, MD as Consulting Physician (Gastroenterology) Shon Hough, MD as Consulting Physician (Ophthalmology) Thornell Sartorius, MD as Consulting Physician (Otolaryngology) [place], Man X, NP as Nurse Practitioner (Nurse Practitioner) Bo Merino, MD as Consulting Physician (Rheumatology) Suella Broad, MD as Consulting Physician (Physical Medicine and Rehabilitation) Netta Cedars, MD as Consulting Physician (Orthopedic Surgery) Fanny Skates, MD as Consulting Physician (General Surgery) Truitt Merle, MD as Consulting Physician (Hematology) Thea Silversmith, MD (Inactive) as Consulting Physician (Radiation Oncology) Rockwell Germany, RN as Registered Nurse Mauro Kaufmann, RN as Registered Nurse Jake Shark, Johny Blamer, NP as Nurse Practitioner (Nurse Practitioner) [name], MD as Consulting Physician (Cardiology)    Date of Service:  06/30/2017   CHIEF COMPLAINTS:  Follow up right breast cancer, triple negative   Oncology History   Breast cancer of upper-outer quadrant of left female breast   Staging form: Breast, AJCC 7th Edition     Clinical: Stage 0 (Tis (DCIS), N0, M0) - Unsigned       Breast cancer of upper-outer quadrant of left female breast ([place])   09/23/2014 Mammogram    Left breast: new cluster of calcifications in the left breast is indeterminate. Spot magnification views are recommended      10/13/2014 Initial Biopsy    Left breast biopsy: DCIS, high-grade, with calcification and necrosis. ER- (0%), PR- (0%)      10/13/2014 Clinical Stage    Stage 0: Tis N0      03/15/2015 Definitive Surgery    Left lumpectomy: DCIS, high grade, with comedonecrosis and calcifications, spanning 1.5 cm, ALH, ER- (0%), PR- (0%).      03/15/2015 Pathologic Stage      Stage 0: Tis N0  Diagnosis 03/14/16 Breast, lumpectomy, Left - HIGH GRADE DUCTAL CARCINOMA IN SITU WITH COMEDONECROSIS AND CALCIFICATIONS, SPANNING APPROXIMATELY 1.5 CM IN GREATEST DIMENSION. - ATYPICAL LOBULAR HYPERPLASIA. - DUCTAL CARCINOMA IN SITU IS LESS THAN 0.2 CM IN SEVERAL AREAS TO THE ANTERIOR MARGIN. 1 of 3      03/29/2015 Survivorship    Survivorship care plan completed and mailed to patient in lieu of in person visit       Cancer of central portion of right female breast ([place])   11/26/2016 Mammogram    Diagnostic Mammogram and [place] 11/26/16  IMPRESSION:  The is a 4.2 cm lobulated mass in the right breast central to the nipple anterior depth is highly suggestive of malignancy. The lymph node in the right axilla is at a moderate concern but not classic for malignancy. An ultrasound guided biopsy is recommended.         11/26/2016 Initial Biopsy    Diagnosis 11/26/16 1. Breast, right, needle core biopsy, 12:00 o'clock - INVASIVE DUCTAL CARCINOMA - SEE COMMENT 2. Lymph node, needle/core biopsy, right axilla - METASTATIC CARCINOMA INVOLVING ONE LYMPH NODE       11/26/2016 Receptors her2    Estrogen Receptor: 0%, NEGATIVE Progesterone Receptor: 0%, NEGATIVE Proliferation Marker Ki67: 20% HER2 - NEGATIVE      11/29/2016 Initial Diagnosis    Infiltrating ductal carcinoma of right breast ([place])      01/10/2017 PET scan    PET 01/10/17  IMPRESSION: 1. Right subareolar mass measures 4.7 cm in long axis with maximum SUV 23.6 compatible with malignancy. There are 2 hypermetabolic mildly enlarged right axillary lymph nodes  but no other metastatic lesions are identified. 2. Other imaging findings of potential clinical significance: Aortic Atherosclerosis (ICD10-I70.0). Coronary atherosclerosis. Mild cardiomegaly. Trace left pleural effusion. Intracranial chronic ischemic microvascular white matter disease.      02/14/2017 - 03/14/2017 Radiation Therapy    Radiation  therapy with Dr. Isidore Moos       HISTORY OF PRESENTING ILLNESS: 03/14/16 [name] 84 y.o. female with multiple medical comorbidities, as listed below, is here because of recently diagnosed left breast DCIS. She presents to our multidisciplinary breast clinic today with her daughter.  This was discovered by screening mammogram. She did not have palpable breast mass, she denies any new symptoms lately. She had a stroke earlier this year, which resulted mild left-sided weakness. She is a resident in an assisted living, she is able to walk with a walker, and do some limited self care, but she is not active, spends most time on sitting during the day. Her daughter states she skips meals sometime, and does not go out often.   CURRENT THERAPY: Supportive Care  INTERVAL HISTORY:  [name] is here for a follow up of her triple negative right breast cancer. She presents to the clinic today accompanied by her caregiver. Patient notes she feels fine and has had no breast pain. She notes she is note eating too well.     MEDICAL HISTORY:  Past Medical History:  Diagnosis Date  . Arthritis    Osteoarthritis  . Cancer of left breast [facility]) August 2016  . Cerebral atherosclerosis 12/19/2012  . Cerebral atrophy 12/19/2012  . Cerebral ischemia 12/19/2012   Microvascular   . CKD (chronic kidney disease) stage 3, GFR 30-59 ml/min (HCC) 11/03/2014  . Cough   . Dementia   . Diverticulosis of colon (without mention of hemorrhage)   . Edema   . Family history of breast cancer   . Flat feet   . GERD (gastroesophageal reflux disease)   . Glaucoma   . Heart murmur   . Hyperlipidemia   . Hypertension   . Hypertonicity of bladder   . Ischemic cardiomyopathy 11/04/2014  . Lumbago   . Memory change 03/28/2016  . Osteoarthrosis, unspecified whether generalized or localized, unspecified site   . Other malaise and fatigue   . Pain in joint, pelvic region and thigh   . Pain in joint, shoulder region   .  Personal history of fall   . RLQ abdominal pain 01/17/2011  . Senile osteoporosis   . Small bowel obstruction ([place])   . STEMI (ST elevation myocardial infarction) ([place]) 11/04/2014  . Unstable gait 11/04/2014  . Urine, incontinence, stress female 09/18/2014    SURGICAL HISTORY: Past Surgical History:  Procedure Laterality Date  . BREAST LUMPECTOMY Bilateral   . BREAST LUMPECTOMY WITH RADIOACTIVE SEED LOCALIZATION Left 03/15/2015   Procedure: LEFT BREAST LUMPECTOMY WITH RADIOACTIVE SEED LOCALIZATION;  Surgeon: Fanny Skates, MD;  Location: [place];  Service: General;  Laterality: Left;  . CARDIAC CATHETERIZATION N/A 11/08/2014   Procedure: Left Heart Cath and Coronary Angiography;  Surgeon: [name], MD;  Location: [facility];  Service: Cardiovascular;  Laterality: N/A;  . COLON SURGERY  02/1996   Colectomy, partial for diverticular bleed 90%  . EYE SURGERY    . JOINT REPLACEMENT Left 12/2000   Left knee; Dr. Wynelle Link  . LAPAROSCOPIC LYSIS OF ADHESIONS  11/15/10   Exploratory  . TONSILLECTOMY     GYN HISTORY  Menarchal: 14 LMP: 52 Contraceptive: HRT: no  G0P0: adopted daughter   SOCIAL HISTORY: Social History   Socioeconomic History  . Marital status: Widowed    Spouse name: Not on file  . Number of children: Not on file  . Years of education: Not on file  . Highest education level: Not on file  Occupational History  . Not on file  Social Needs  . Financial resource strain: Patient refused  . Food insecurity:    Worry: Patient refused    Inability: Patient refused  . Transportation needs:    Medical: Patient refused    Non-medical: Patient refused  Tobacco Use  . Smoking status: Former Smoker    Packs/day: 0.50    Years: 30.00    Pack years: 15.00    Types: Cigarettes    Last attempt to quit: 12/16/1980    Years since quitting: 36.5  . Smokeless tobacco: Never Used  Substance and Sexual Activity  . Alcohol use: Yes    Alcohol/week: 0.6 oz    Types:  1 Glasses of wine per week    Comment: occasional glass of wine  . Drug use: No  . Sexual activity: Never  Lifestyle  . Physical activity:    Days per week: Patient refused    Minutes per session: Patient refused  . Stress: Patient refused  Relationships  . Social connections:    Talks on phone: Patient refused    Gets together: Patient refused    Attends religious service: Patient refused    Active member of club or organization: Patient refused    Attends meetings of clubs or organizations: Patient refused    Relationship status: Widowed  . Intimate partner violence:    Fear of current or ex partner: Patient refused    Emotionally abused: Patient refused    Physically abused: Patient refused    Forced sexual activity: Patient refused  Other Topics Concern  . Not on file  Social History Narrative   Lives at [place] 05/2011   Widowed 2003   Living Will   [place] with cane   Never smoked    Exercise none     FAMILY HISTORY: Family History  Problem Relation Age of Onset  . Cancer Father 11       colon  . Cancer Maternal Aunt        breast cancer   . Cancer Cousin        breast cancer     ALLERGIES:  is allergic to sulfa antibiotics.  MEDICATIONS:  Current Outpatient Medications  Medication Sig Dispense Refill  . acetaminophen (TYLENOL) 325 MG tablet Take 650 mg by mouth 2 (two) times daily. Take 2 tablets every 6 hours as needed for mild pain or fever    . amLODipine (NORVASC) 2.5 MG tablet Take 5 mg by mouth daily.    Marland Kitchen atorvastatin (LIPITOR) 10 MG tablet Take 10 mg by mouth daily.    . carvedilol (COREG) 12.5 MG tablet Take 12.5 mg by mouth 2 (two) times daily with a meal.    . cloNIDine (CATAPRES) 0.1 MG tablet Take 0.1 mg by mouth 2 (two) times daily.    . Coenzyme Q10 50 MG TABS Take one tablet by mouth at bedtime    . famotidine (PEPCID) 20 MG tablet Take 20 mg by mouth at bedtime.     . ferrous sulfate 325 (65 FE) MG tablet Take 325 mg by mouth  daily with breakfast.    . folic acid (FOLVITE) 1 MG tablet Take 1  mg by mouth daily.    . Hypromellose (ARTIFICIAL TEARS OP) Place 2 drops 2 (two) times daily into both eyes.     . isosorbide mononitrate (IMDUR) 60 MG 24 hr tablet Take 90 mg by mouth at bedtime.     Marland Kitchen KLOR-CON 10 10 MEQ tablet Take 10 mEq by mouth daily.    Marland Kitchen latanoprost (XALATAN) 0.005 % ophthalmic solution Place 1 drop into both eyes at bedtime.     . timolol (BETIMOL) 0.5 % ophthalmic solution Place 1 drop into both eyes daily.    Marland Kitchen torsemide (DEMADEX) 10 MG tablet Take 10 mg daily by mouth. Hold for SBP < 110    . vitamin B-12 (CYANOCOBALAMIN) 1000 MCG tablet Take 1,000 mcg See admin instructions by mouth. Take 1000 mcg by mouth daily On Mondays,Wednesdays and Fridays     No current facility-administered medications for this visit.     REVIEW OF SYSTEMS:   Constitutional: Denies fevers, chills or abnormal night sweats  Eyes: Denies blurriness of vision, double vision or watery eyes Ears, nose, mouth, throat, and face: Denies mucositis or sore throat (+) Hard of Hearing  Respiratory: Denies cough, dyspnea or wheezes Cardiovascular: Denies palpitation, chest discomfort or lower extremity swelling Gastrointestinal:  Denies nausea, heartburn or change in bowel habits Skin: Denies abnormal skin rashes Lymphatics: Denies new lymphadenopathy or easy bruising Neurological:Denies numbness, tingling or new weaknesses MSK:  (+) wheelchair bound  Breast: (+) palpable mass in right breast, improved Behavioral/Psych: Mood is stable, no new changes  All other systems were reviewed with the patient and are negative.   PHYSICAL EXAMINATION: ECOG PERFORMANCE STATUS: 3 - Symptomatic, >50% confined to bed  Vitals:   06/30/17 0835  BP: 127/63  Pulse: 71  Resp: 18  Temp: 98.6 F (37 C)  SpO2: 98%   Filed Weights    GENERAL:alert, no distress and comfortable  (+) she is wheelchair bound  SKIN: skin color, texture, turgor  are normal, no rashes or significant lesions EYES: normal, conjunctiva are pink and non-injected, sclera clear OROPHARYNX:no exudate, no erythema and lips, buccal mucosa, and tongue normal  NECK: supple, thyroid normal size, non-tender, without nodularity LYMPH:  no palpable lymphadenopathy in the cervical, axillary or inguinal LUNGS: clear to auscultation and percussion with normal breathing effort HEART: regular rate & rhythm and no murmurs and no lower extremity edema ABDOMEN:abdomen soft, non-tender and normal bowel sounds Musculoskeletal:no cyanosis of digits and no clubbing  PSYCH: alert & oriented x 3 with fluent speech NEURO: no focal motor/sensory deficits Breasts: Breast: Exam done in wheelchair: She has a now 3x3.5cm mass (previously 4x5cm) in the central portion of right breast near areolar with mild skin erythema, no open ulcers (see picture below). She has a 2.5cm palpable lymph node in right lower axilla, flatter than before. Left breast and axilla has no palpable mass or adenopathy.           LABORATORY DATA:  I have reviewed the data as listed CBC Latest Ref Rng & Units 06/30/2017 05/27/2017 05/22/2017  WBC 3.9 - 10.3 K/uL 6.0 7.1 5.9  Hemoglobin 11.6 - 15.9 g/dL 10.6(L) 9.3(A) 9.0(A)  Hematocrit 34.8 - 46.6 % 34.0(L) 29(A) 27(A)  Platelets 145 - 400 K/uL 208 228 217   CMP Latest Ref Rng & Units 06/30/2017 05/22/2017 03/31/2017  Glucose 70 - 140 mg/dL 141(H) - 97  BUN 7 - 26 mg/dL 26 31(A) 25  Creatinine 0.60 - 1.10 mg/dL 2.19(H) 2.3(A) 2.22(H)  Sodium 136 - 145  mmol/L 140 140 139  Potassium 3.5 - 5.1 mmol/L 3.7 4.3 4.0  Chloride 98 - 109 mmol/L 112(H) - 106  CO2 22 - 29 mmol/L 19(L) - 23  Calcium 8.4 - 10.4 mg/dL 8.8 - 9.3  Total Protein 6.4 - 8.3 g/dL 6.9 - 7.1  Total Bilirubin 0.2 - 1.2 mg/dL 0.4 - 0.5  Alkaline Phos 40 - 150 U/L 46 39 53  AST 5 - 34 U/L 12 11(A) 15  ALT 0 - 55 U/L 9 4(A) <6     PATHOLOGY REPORT:  Diagnosis 11/26/16 1. Breast, right, needle  core biopsy, 12:00 o'clock - INVASIVE DUCTAL CARCINOMA - SEE COMMENT 2. Lymph node, needle/core biopsy, right axilla - METASTATIC CARCINOMA INVOLVING ONE LYMPH NODE Microscopic Comment 1. Based on the biopsy, the carcinoma appears Nottingham grade 2 of 3 and measures 0.9 cm in greatest linear extent. Dr. Saralyn Pilar reviewed the case. Prognostic markers (ER/PR/ki-67/HER2-FISH) are pending and will be reported in an addendum. This case was called to Dr. Marcelo Baldy on November 27, 2016. 2. FLUORESCENCE IN-SITU HYBRIDIZATION Results: HER2 - NEGATIVE RATIO OF HER2/CEP17 SIGNALS 1.38 AVERAGE HER2 COPY NUMBER PER CELL 2.00 Reference Range: NEGATIVE HER2/CEP17 Ratio <2.0 and average HER2 copy number <4.0 EQUIVOCAL HER2/CEP17 Ratio <2.0 and average HER2 copy number >=4.0 and <6.0 POSITIVE HER2/CEP17 Ratio >=2.0 or <2.0 and average HER2 copy number >=6.0 Vicente Males MD Pathologist, Electronic Signature ( Signed 11/29/2016) 2. PROGNOSTIC INDICATORS Results: IMMUNOHISTOCHEMICAL AND MORPHOMETRIC ANALYSIS PERFORMED MANUALLY Estrogen Receptor: 0%, NEGATIVE Progesterone Receptor: 0%, NEGATIVE Proliferation Marker Ki67: 20% COMMENT: The negative hormone receptor study(ies) in this case has no internal positive control.   Diagnosis 03/14/16 Breast, lumpectomy, Left - HIGH GRADE DUCTAL CARCINOMA IN SITU WITH COMEDONECROSIS AND CALCIFICATIONS, SPANNING APPROXIMATELY 1.5 CM IN GREATEST DIMENSION. - ATYPICAL LOBULAR HYPERPLASIA. - DUCTAL CARCINOMA IN SITU IS LESS THAN 0.2 CM IN SEVERAL AREAS TO THE ANTERIOR MARGIN. 1 of 3 FINAL for [place], Violanda M ([id number]) Diagnosis(continued) - DUCTAL CARCINOMA IN SITU IS FOCALLY LESS THAN 0.2 CM TO THE SUPERIOR AND POSTERIOR MARGINS. - OTHER MARGINS ARE NEGATIVE. - SEE ONCOLOGY TEMPLATE. Microscopic Comment BREAST, IN SITU CARCINOMA Specimen, including laterality: Left partial breast. Procedure (include lymph node sampling sentinel-non-sentinel): Left breast  lumpectomy. Grade of carcinoma: High grade. Necrosis: Yes, comedonecrosis is present. Estimated tumor size: (gross measurement): 1.5 cm. Treatment effect: Not applicable. Distance to closest margin: Ductal carcinoma in situ is less than 0.2 cm from anterior, superior and posterior margins. Breast prognostic profile: Performed on previous case [id number]. Estrogen receptor: 0%, negative. Progesterone receptor: 0%, negative. Lymph nodes: No lymph nodes received. TNM: pTis, pNX. Comments: A p63, smooth muscle myosin and calponin immunohistochemical stain is performed on three blocks (nine stains total) to rule out the possibility of invasive carcinoma. The staining pattern fails to demonstrate evidence of invasive carcinoma. As estrogen receptor and progesterone receptor were previously negative, these markers will be repeated on the tumor and reported in an addendum to follow. (RH:ds 03/16/15) PROGNOSTIC INDICATORS Results: IMMUNOHISTOCHEMICAL AND MORPHOMETRIC ANALYSIS PERFORMED MANUALLY Estrogen Receptor: 0%, NEGATIVE Progesterone Receptor: 0%, NEGATIVE COMMENT: The negative hormone receptor study(ies) in this case has an internal positive control.    Diagnosis 10/13/2014 Breast, left, needle core biopsy, calcs - HIGH GRADE DUCTAL CARCINOMA IN SITU WITH CALCIFICATIONS AND NECROSIS. - SEE MICROSCOPIC DESCRIPTION. - FOCAL ATYPICAL LOBULAR HYPERPLASIA. Microscopic Comment Immunohistochemistry for basal cell markers show positivity with Calponin, p63, and smooth muscle myosin supporting the diagnosis of high grade ductal carcinoma in situ. Estrogen and  progesterone receptors will be performed. Results: IMMUNOHISTOCHEMICAL AND MORPHOMETRIC ANALYSIS PERFORMED MANUALLY Estrogen Receptor: 0%, NEGATIVE Progesterone Receptor: 0%, NEGATIVE   RADIOGRAPHIC STUDIES: I have personally reviewed the radiological images as listed and agreed with the findings in the report.  PET 01/10/17    IMPRESSION: 1. Right subareolar mass measures 4.7 cm in long axis with maximum SUV 23.6 compatible with malignancy. There are 2 hypermetabolic mildly enlarged right axillary lymph nodes but no other metastatic lesions are identified. 2. Other imaging findings of potential clinical significance: Aortic Atherosclerosis (ICD10-I70.0). Coronary atherosclerosis. Mild cardiomegaly. Trace left pleural effusion. Intracranial chronic ischemic microvascular white matter disease.  Diagnostic Mammogram and [place] 11/26/16  IMPRESSION:  The is a 4.2 cm lobulated mass in the right breast central to the nipple anterior depth is highly suggestive of malignancy. The lymph node in the right axilla is at a moderate concern but not classic for malignancy. An ultrasound guided biopsy is recommended.     Mammogram 09/28/2014 There is a new cluster of pleomorphic calcifications in the left breast at 12:00 middle depth.   ASSESSMENT & PLAN: 84 y.o. Caucasian female, postmenopausal, with multiple comorbidities, including a stroke in January 2016, wheelchair bound, dementia, myocardial infarction, CHF, a resident at assisted living and h/o of left breast DCIS in 2016.    1. Infiltrating ductal carcinoma of right breast, invasive ductal carcinoma, cT 3N1M0, stage IIIb, G2, triple Negative -I reviewed her image findings and her biopsy results. She has aggressive triple negative breast cancer.  -On physical exam, she has a large central breast mass, with bulky right adenopathy.   -I reviewed her staging PET scan, which showed no distant metastasis -Due to her rapidly growing disease she was evaluated for right mastectomy with Dr. Dalbert Batman, but due to her high risk of surgery and anesthesia secondary to her heart condition, patient's daughter has decided not to pursue mastectomy.   -Given her advanced age and comorbidities, she is not a candidate for chemotherapy.  -Due to her triple negative disease she is not a  candidate for antiestrogen therapy.  -Now has completed palliative radiation 02/14/17-03/14/17, and her right breast tumor has shrunk some -Given her recurrent breast cancer and family history she is eligible for genetic testing. If her results show she has BRCA gene mutation she can consider PARP inhibitor. She declined during her genetic counseling consult.  -Physical exam shows her right breast mass and left axillary lymph node are getting smaller. Clinically she has had great response to radiation.  -Labs reviewed, Hg improved to 10.6, anemia improved, other blood counts WNL.  -I explained this cancer will at some point will start growing again. I told patient's nursing staff member, If she observes increase in nodule size in right breast or other concerning symptoms she should contact our clinic to be seen sooner.  -I called her daughter Benjamine Mola and left messeage discussing this visit.  -Continue supportive care and observation -F/u in 3 months   2. H/o Left breast DCIS, high-grade, ER negative/PR negative, 2016  -Reviewed her scan and breast biopsy results. -The patient had early stage disease. She is considered cured of disease after completing surgical resection.  -She was seen by Dr. Dalbert Batman, lumpectomy without sentinel lymph nodes biopsy was discussed with patient and her daughter. Given her advanced age and medical comorbidities, she does have some risk with anesthesia. Patient and her daughter are in favor of having surgery. Any form of adjuvant treatment is for prevention of disease recurrence.  -Given her  negative ER and PR status of her tumor, I do not recommend any antiestrogen therapy.  -If she has wide negative surgical margin, she may not benefit much from adjuvant radiation and also, given her advanced age. She was seen by Dr. Pablo Ledger  -She underwent Left Breast Lumpectomy on 03/15/15 by Dr. Dalbert Batman    3. Dementia -Wheelchair bound, She can feed herself, otherwise she is  under full scale care at [facility].  -Her daughter is her Legal guardian    Plan  -She has had a good response to radiation, continue observation -Called her daughter and left a message about the visit and updates  -Lab and f/u in 3 months    All questions were answered. The patient knows to call the clinic with any problems, questions or concerns. I spent 15 minutes counseling the patient face to face. The total time spent in the appointment was 20 minutes and more than 50% was on counseling.     Truitt Merle, MD 06/30/2017 11:35 AM   This document serves as a record of services personally performed by Truitt Merle, MD. It was created on her behalf by Joslyn Devon, a trained medical scribe. The creation of this record is based on the scribe's personal observations and the provider's statements to them.   I have reviewed the above documentation for accuracy and completeness, and I agree with the above.

## 2017-06-30 ENCOUNTER — Telehealth: Payer: Self-pay | Admitting: Hematology

## 2017-06-30 ENCOUNTER — Inpatient Hospital Stay: Payer: Medicare Other | Attending: Hematology | Admitting: Hematology

## 2017-06-30 ENCOUNTER — Inpatient Hospital Stay: Payer: Medicare Other

## 2017-06-30 ENCOUNTER — Encounter: Payer: Self-pay | Admitting: Hematology

## 2017-06-30 VITALS — BP 127/63 | HR 71 | Temp 98.6°F | Resp 18 | Ht 65.0 in

## 2017-06-30 DIAGNOSIS — N183 Chronic kidney disease, stage 3 (moderate): Secondary | ICD-10-CM | POA: Diagnosis not present

## 2017-06-30 DIAGNOSIS — I509 Heart failure, unspecified: Secondary | ICD-10-CM | POA: Insufficient documentation

## 2017-06-30 DIAGNOSIS — M1990 Unspecified osteoarthritis, unspecified site: Secondary | ICD-10-CM | POA: Diagnosis not present

## 2017-06-30 DIAGNOSIS — Z86000 Personal history of in-situ neoplasm of breast: Secondary | ICD-10-CM | POA: Diagnosis not present

## 2017-06-30 DIAGNOSIS — Z171 Estrogen receptor negative status [ER-]: Secondary | ICD-10-CM | POA: Diagnosis not present

## 2017-06-30 DIAGNOSIS — Z79899 Other long term (current) drug therapy: Secondary | ICD-10-CM | POA: Diagnosis not present

## 2017-06-30 DIAGNOSIS — Z8673 Personal history of transient ischemic attack (TIA), and cerebral infarction without residual deficits: Secondary | ICD-10-CM | POA: Diagnosis not present

## 2017-06-30 DIAGNOSIS — I129 Hypertensive chronic kidney disease with stage 1 through stage 4 chronic kidney disease, or unspecified chronic kidney disease: Secondary | ICD-10-CM | POA: Diagnosis not present

## 2017-06-30 DIAGNOSIS — F0390 Unspecified dementia without behavioral disturbance: Secondary | ICD-10-CM | POA: Insufficient documentation

## 2017-06-30 DIAGNOSIS — Z993 Dependence on wheelchair: Secondary | ICD-10-CM | POA: Diagnosis not present

## 2017-06-30 DIAGNOSIS — E785 Hyperlipidemia, unspecified: Secondary | ICD-10-CM | POA: Insufficient documentation

## 2017-06-30 DIAGNOSIS — Z923 Personal history of irradiation: Secondary | ICD-10-CM | POA: Diagnosis not present

## 2017-06-30 DIAGNOSIS — C50111 Malignant neoplasm of central portion of right female breast: Secondary | ICD-10-CM | POA: Insufficient documentation

## 2017-06-30 DIAGNOSIS — I252 Old myocardial infarction: Secondary | ICD-10-CM | POA: Diagnosis not present

## 2017-06-30 DIAGNOSIS — I2510 Atherosclerotic heart disease of native coronary artery without angina pectoris: Secondary | ICD-10-CM | POA: Insufficient documentation

## 2017-06-30 DIAGNOSIS — C50412 Malignant neoplasm of upper-outer quadrant of left female breast: Secondary | ICD-10-CM

## 2017-06-30 DIAGNOSIS — K219 Gastro-esophageal reflux disease without esophagitis: Secondary | ICD-10-CM | POA: Diagnosis not present

## 2017-06-30 DIAGNOSIS — Z87891 Personal history of nicotine dependence: Secondary | ICD-10-CM | POA: Diagnosis not present

## 2017-06-30 LAB — CBC WITH DIFFERENTIAL/PLATELET
BASOS ABS: 0 10*3/uL (ref 0.0–0.1)
Basophils Relative: 0 %
Eosinophils Absolute: 0.2 10*3/uL (ref 0.0–0.5)
Eosinophils Relative: 3 %
HEMATOCRIT: 34 % — AB (ref 34.8–46.6)
Hemoglobin: 10.6 g/dL — ABNORMAL LOW (ref 11.6–15.9)
LYMPHS PCT: 15 %
Lymphs Abs: 0.9 10*3/uL (ref 0.9–3.3)
MCH: 28.4 pg (ref 25.1–34.0)
MCHC: 31.2 g/dL — ABNORMAL LOW (ref 31.5–36.0)
MCV: 91.2 fL (ref 79.5–101.0)
MONO ABS: 0.9 10*3/uL (ref 0.1–0.9)
Monocytes Relative: 14 %
Neutro Abs: 4.1 10*3/uL (ref 1.5–6.5)
Neutrophils Relative %: 68 %
Platelets: 208 10*3/uL (ref 145–400)
RBC: 3.73 MIL/uL (ref 3.70–5.45)
RDW: 16.5 % — ABNORMAL HIGH (ref 11.2–14.5)
WBC: 6 10*3/uL (ref 3.9–10.3)

## 2017-06-30 LAB — COMPREHENSIVE METABOLIC PANEL
ALBUMIN: 3.9 g/dL (ref 3.5–5.0)
ALT: 9 U/L (ref 0–55)
ANION GAP: 9 (ref 3–11)
AST: 12 U/L (ref 5–34)
Alkaline Phosphatase: 46 U/L (ref 40–150)
BILIRUBIN TOTAL: 0.4 mg/dL (ref 0.2–1.2)
BUN: 26 mg/dL (ref 7–26)
CHLORIDE: 112 mmol/L — AB (ref 98–109)
CO2: 19 mmol/L — ABNORMAL LOW (ref 22–29)
Calcium: 8.8 mg/dL (ref 8.4–10.4)
Creatinine, Ser: 2.19 mg/dL — ABNORMAL HIGH (ref 0.60–1.10)
GFR calc Af Amer: 22 mL/min — ABNORMAL LOW (ref >60)
GFR calc non Af Amer: 19 mL/min — ABNORMAL LOW (ref >60)
GLUCOSE: 141 mg/dL — AB (ref 70–140)
Potassium: 3.7 mmol/L (ref 3.5–5.1)
Sodium: 140 mmol/L (ref 136–145)
TOTAL PROTEIN: 6.9 g/dL (ref 6.4–8.3)

## 2017-06-30 NOTE — Telephone Encounter (Signed)
Appointments scheduled AVS/Calendar printed per 5/13 los °

## 2017-07-01 ENCOUNTER — Other Ambulatory Visit: Payer: Self-pay | Admitting: *Deleted

## 2017-07-01 DIAGNOSIS — D649 Anemia, unspecified: Secondary | ICD-10-CM | POA: Diagnosis not present

## 2017-07-01 LAB — CBC AND DIFFERENTIAL
HEMATOCRIT: 33 — AB (ref 36–46)
HEMOGLOBIN: 10.8 — AB (ref 12.0–16.0)
PLATELETS: 225 (ref 150–399)
WBC: 6

## 2017-07-09 ENCOUNTER — Non-Acute Institutional Stay (SKILLED_NURSING_FACILITY): Payer: Medicare Other | Admitting: Nurse Practitioner

## 2017-07-09 ENCOUNTER — Encounter: Payer: Self-pay | Admitting: Nurse Practitioner

## 2017-07-09 DIAGNOSIS — N184 Chronic kidney disease, stage 4 (severe): Secondary | ICD-10-CM

## 2017-07-09 DIAGNOSIS — D509 Iron deficiency anemia, unspecified: Secondary | ICD-10-CM | POA: Diagnosis not present

## 2017-07-09 DIAGNOSIS — I5043 Acute on chronic combined systolic (congestive) and diastolic (congestive) heart failure: Secondary | ICD-10-CM

## 2017-07-09 DIAGNOSIS — R609 Edema, unspecified: Secondary | ICD-10-CM | POA: Diagnosis not present

## 2017-07-09 NOTE — Progress Notes (Signed)
Location:  [facility] Room Number: 5 Place of Service:  SNF (31) Provider:  Sena Clouatre, ManXie  NP  [name], MD  Patient Care Team: [name], MD as PCP - General (Internal Medicine) Juanita Craver, MD as Consulting Physician (Gastroenterology) Shon Hough, MD as Consulting Physician (Ophthalmology) Thornell Sartorius, MD as Consulting Physician (Otolaryngology) [place] Lalania Haseman X, NP as Nurse Practitioner (Nurse Practitioner) Bo Merino, MD as Consulting Physician (Rheumatology) Suella Broad, MD as Consulting Physician (Physical Medicine and Rehabilitation) Netta Cedars, MD as Consulting Physician (Orthopedic Surgery) Fanny Skates, MD as Consulting Physician (General Surgery) Truitt Merle, MD as Consulting Physician (Hematology) Thea Silversmith, MD as Consulting Physician (Radiation Oncology) Rockwell Germany, RN as Registered Nurse Mauro Kaufmann, RN as Registered Nurse Jake Shark, Johny Blamer, NP as Nurse Practitioner (Nurse Practitioner) [name], MD as Consulting Physician (Cardiology)  Extended Emergency Contact Information Primary Emergency Contact: [name],[name] Address: [address]           [address] [place] Phone: [phone] Mobile Phone: [phone] Relation: Daughter Secondary Emergency Contact: [name],[name]  [place] Mobile Phone: [phone] Relation: None  Code Status:  DNR Goals of care: Advanced Directive information Advanced Directives 07/09/2017  Does Patient Have a Medical Advance Directive? Yes  Type of Advance Directive Out of facility DNR (pink MOST or yellow form)  Does patient want to make changes to medical advance directive? No - Patient declined  Copy of [place] in Chart? -  Pre-existing out of facility DNR order (yellow form or pink MOST form) Yellow form placed in chart (order not valid for  inpatient use)     Chief Complaint  Patient presents with  . Acute Visit    (L) ankle swollen    HPI:  Pt is a 84 y.o. female seen today for an acute visit for reported the patient's left ankle edema, the patient has history of DHF with swelling in legs. The patient denied pain or fever in the left ankle. No noted bruise, redness, or warmth in the area. Her weights: 06/19/17 #152Ib. 05/20/17 #152Ibs, 04/23/17 #149Ibs, 04/18/17 #149Ibs, 04/14/17 # 148 Ibs.    Past Medical History:  Diagnosis Date  . Arthritis    Osteoarthritis  . Cancer of left breast [facility]) August 2016  . Cerebral atherosclerosis 12/19/2012  . Cerebral atrophy 12/19/2012  . Cerebral ischemia 12/19/2012   Microvascular   . CKD (chronic kidney disease) stage 3, GFR 30-59 ml/min (HCC) 11/03/2014  . Cough   . Dementia   . Diverticulosis of colon (without mention of hemorrhage)   . Edema   . Family history of breast cancer   . Flat feet   . GERD (gastroesophageal reflux disease)   . Glaucoma   . Heart murmur   . Hyperlipidemia   . Hypertension   . Hypertonicity of bladder   . Ischemic cardiomyopathy 11/04/2014  . Lumbago   . Memory change 03/28/2016  . Osteoarthrosis, unspecified whether generalized or localized, unspecified site   . Other malaise and fatigue   . Pain in joint, pelvic region and thigh   . Pain in joint, shoulder region   . Personal history of fall   . RLQ abdominal pain 01/17/2011  . Senile osteoporosis   . Small bowel obstruction ([place])   . STEMI (ST elevation myocardial infarction) ([place]) 11/04/2014  . Unstable gait 11/04/2014  . Urine, incontinence, stress female 09/18/2014   Past Surgical History:  Procedure  Laterality Date  . BREAST LUMPECTOMY Bilateral   . BREAST LUMPECTOMY WITH RADIOACTIVE SEED LOCALIZATION Left 03/15/2015   Procedure: LEFT BREAST LUMPECTOMY WITH RADIOACTIVE SEED LOCALIZATION;  Surgeon: Fanny Skates, MD;  Location: [facility];  Service: General;  Laterality: Left;  . CARDIAC  CATHETERIZATION N/A 11/08/2014   Procedure: Left Heart Cath and Coronary Angiography;  Surgeon: [name], MD;  Location: [facility];  Service: Cardiovascular;  Laterality: N/A;  . COLON SURGERY  02/1996   Colectomy, partial for diverticular bleed 90%  . EYE SURGERY    . JOINT REPLACEMENT Left 12/2000   Left knee; Dr. Wynelle Link  . LAPAROSCOPIC LYSIS OF ADHESIONS  11/15/10   Exploratory  . TONSILLECTOMY      Allergies  Allergen Reactions  . Sulfa Antibiotics Itching    Outpatient Encounter Medications as of 07/09/2017  Medication Sig  . acetaminophen (TYLENOL) 325 MG tablet Take 650 mg by mouth 2 (two) times daily. Take 2 tablets every 6 hours as needed for mild pain or fever  . amLODipine (NORVASC) 2.5 MG tablet Take 5 mg by mouth daily.  Marland Kitchen atorvastatin (LIPITOR) 10 MG tablet Take 10 mg by mouth daily.  . carvedilol (COREG) 12.5 MG tablet Take 12.5 mg by mouth 2 (two) times daily with a meal.  . cloNIDine (CATAPRES) 0.1 MG tablet Take 0.1 mg by mouth 2 (two) times daily.  . Coenzyme Q10 50 MG TABS Take one tablet by mouth at bedtime  . famotidine (PEPCID) 20 MG tablet Take 20 mg by mouth at bedtime.   . ferrous sulfate 325 (65 FE) MG tablet Take 325 mg by mouth daily with breakfast.  . folic acid (FOLVITE) 1 MG tablet Take 1 mg by mouth daily.  . isosorbide mononitrate (IMDUR) 60 MG 24 hr tablet Take 90 mg by mouth at bedtime.   Marland Kitchen KLOR-CON 10 10 MEQ tablet Take 10 mEq by mouth daily.  Marland Kitchen latanoprost (XALATAN) 0.005 % ophthalmic solution Place 1 drop into both eyes at bedtime.   . torsemide (DEMADEX) 10 MG tablet Take 10 mg daily by mouth. Hold for SBP < 110  . vitamin B-12 (CYANOCOBALAMIN) 1000 MCG tablet Take 1,000 mcg See admin instructions by mouth. Take 1000 mcg by mouth daily On Mondays,Wednesdays and Fridays  . [DISCONTINUED] Hypromellose (ARTIFICIAL TEARS OP) Place 2 drops 2 (two) times daily into both eyes.   . [DISCONTINUED] timolol (BETIMOL) 0.5 % ophthalmic solution  Place 1 drop into both eyes daily.   No facility-administered encounter medications on file as of 07/09/2017.    ROS was provided with assistance of staff Review of Systems  Constitutional: Negative for activity change, appetite change, chills, diaphoresis, fatigue and fever.  HENT: Positive for hearing loss. Negative for congestion.   Respiratory: Negative for cough, shortness of breath and wheezing.   Cardiovascular: Positive for leg swelling. Negative for chest pain and palpitations.  Musculoskeletal: Positive for arthralgias and gait problem. Negative for joint swelling.  Skin: Negative for color change, pallor, rash and wound.  Neurological: Negative for dizziness, speech difficulty, weakness and headaches.       Dementia  Psychiatric/Behavioral: Negative for agitation and behavioral problems.    Immunization History  Administered Date(s) Administered  . Influenza Whole 11/19/2011, 12/02/2012  . Influenza-Unspecified 12/06/2013, 12/06/2015, 12/02/2016  . PPD Test 11/10/2014  . Pneumococcal Conjugate-13 03/09/2009  . Pneumococcal Polysaccharide-23 02/18/2009  . Tdap 04/02/2016   Pertinent  Health Maintenance Due  Topic Date Due  . INFLUENZA VACCINE  09/18/2017  .  MAMMOGRAM  11/26/2017  . DEXA SCAN  Completed  . PNA vac Low Risk Adult  Completed   Fall Risk  02/05/2017 01/31/2017 07/13/2015 11/17/2014 04/21/2014  Falls in the past year? No No No Yes Yes  Number falls in past yr: - - - 1 1  Injury with Fall? - - - No No  Comment - - - - -  Risk for fall due to : - History of fall(s);Impaired balance/gait;Impaired vision - Impaired balance/gait;History of fall(s) -   Functional Status Survey:    Vitals:   07/09/17 1158  BP: 132/70  Pulse: 67  Resp: 16  Temp: (!) 97.2 F (36.2 C)  SpO2: 96%  Weight: 151 lb 9.6 oz (68.8 kg)  Height: 5\' 5"  (1.651 m)   Body mass index is 25.23 kg/m. Physical Exam  Constitutional: She appears well-developed and well-nourished. No  distress.  HENT:  Head: Normocephalic and atraumatic.  Neck: Normal range of motion. Neck supple. No thyromegaly present.  Cardiovascular: Normal rate and regular rhythm.  Murmur heard. Pulmonary/Chest: Effort normal. She has no wheezes. She has no rales.  Abdominal: Soft. Bowel sounds are normal.  Musculoskeletal: She exhibits edema.  Trace edema BLE L>R, chronic R knee pain.   Neurological: She is alert. No cranial nerve deficit. She exhibits normal muscle tone. Coordination normal.  Oriented to person and place.   Skin: Skin is warm and dry. She is not diaphoretic. No erythema. No pallor.  Psychiatric: She has a normal mood and affect. Her behavior is normal.    Labs reviewed: Recent Labs    08/25/16 0105 08/26/16 0427  03/31/17 0836 05/22/17 06/30/17 0820  NA 138 138   < > 139 140 140  K 3.4* 4.0   < > 4.0 4.3 3.7  CL 106 105  --  106  --  112*  CO2 22 21*  --  23  --  19*  GLUCOSE 161* 137*  --  97  --  141*  BUN 41* 64*   < > 25 31* 26  CREATININE 2.20* 2.90*   < > 2.22* 2.3* 2.19*  CALCIUM 8.5* 8.2*  --  9.3  --  8.8  MG 1.9  --   --   --   --   --    < > = values in this interval not displayed.   Recent Labs    08/24/16 1053  03/31/17 0836 05/22/17 06/30/17 0820  AST 17   < > 15 11* 12  ALT 9*   < > <6 4* 9  ALKPHOS 42   < > 53 39 46  BILITOT 0.9  --  0.5  --  0.4  PROT 7.1  --  7.1  --  6.9  ALBUMIN 3.8  --  3.8  --  3.9   < > = values in this interval not displayed.   Recent Labs    08/24/16 1053  08/26/16 0427  03/31/17 0836  05/27/17 06/30/17 0820 07/01/17  WBC 10.8*   < > 12.9*   < > 4.9   < > 7.1 6.0 6.0  NEUTROABS 8.2*  --   --   --  3.3  --   --  4.1  --   HGB 12.2   < > 10.0*   < > 10.9*   < > 9.3* 10.6* 10.8*  HCT 36.3   < > 30.6*   < > 33.7*   < > 29* 34.0* 33*  MCV 89.9   < >  86.4  --  90.3  --   --  91.2  --   PLT 186   < > 197   < > 190   < > 228 208 225   < > = values in this interval not displayed.   Lab Results  Component Value  Date   TSH 2.21 04/04/2016   Lab Results  Component Value Date   HGBA1C 5.6 11/06/2014   Lab Results  Component Value Date   CHOL 110 04/30/2017   HDL 41 04/30/2017   LDLCALC 50 04/30/2017   TRIG 111 04/30/2017   CHOLHDL 3.1 11/06/2014    Significant Diagnostic Results in last 30 days:  No results found.  Assessment/Plan Edema 07/08/17 reported left ankle swelling, her weights: 06/19/17 #152Ib. 05/20/17 #152Ibs, 04/23/17 #149Ibs, 04/18/17 #149Ibs, 04/14/17 # 148 Ibs.  She denied pain, no noted warmth or deformity. She denied cough, sputum production, or paroxysmal nocturnal orthopnea. No O2 desaturation. Weight daily ac breakfast x 5 days  Acute on chronic combined systolic and diastolic CHF (congestive heart failure) ([place]) Compensated clinically, continue Torsemide 10mg  qd, monitor for developing s/s of acute decompensation of CHF.   CKD (chronic kidney disease) stage 4, GFR 15-29 ml/min (HCC) Chronic creat 2.3 05/22/17, update CMP  Anemia Improved, last Hgb 10.3 06/26/17.      Family/ staff Communication: plan of care reviewed with the patient and charge nurse.   Labs/tests ordered:  CMP  Time spend 25 minutes.

## 2017-07-09 NOTE — Assessment & Plan Note (Addendum)
07/08/17 reported left ankle swelling, her weights: 06/19/17 #152Ib. 05/20/17 #152Ibs, 04/23/17 #149Ibs, 04/18/17 #149Ibs, 04/14/17 # 148 Ibs.  She denied pain, no noted warmth or deformity. She denied cough, sputum production, or paroxysmal nocturnal orthopnea. No O2 desaturation. Weight daily ac breakfast x 5 days

## 2017-07-09 NOTE — Assessment & Plan Note (Signed)
Chronic creat 2.3 05/22/17, update CMP

## 2017-07-09 NOTE — Assessment & Plan Note (Signed)
Compensated clinically, continue Torsemide 10mg  qd, monitor for developing s/s of acute decompensation of CHF.

## 2017-07-09 NOTE — Assessment & Plan Note (Signed)
Improved, last Hgb 10.3 06/26/17.

## 2017-07-10 ENCOUNTER — Encounter: Payer: Self-pay | Admitting: Nurse Practitioner

## 2017-07-10 DIAGNOSIS — N184 Chronic kidney disease, stage 4 (severe): Secondary | ICD-10-CM | POA: Diagnosis not present

## 2017-07-10 LAB — BASIC METABOLIC PANEL
BUN: 24 — AB (ref 4–21)
Creatinine: 2.1 — AB (ref <=1.1)
GLUCOSE: 97
Potassium: 3.5 (ref 3.4–5.3)
Sodium: 140 (ref 137–147)

## 2017-07-10 LAB — HEPATIC FUNCTION PANEL
ALT: 5 — AB (ref 7–35)
AST: 13 (ref 13–35)
Alkaline Phosphatase: 40 (ref 25–125)
Bilirubin, Total: 0.5

## 2017-07-11 ENCOUNTER — Other Ambulatory Visit: Payer: Self-pay | Admitting: *Deleted

## 2017-07-11 LAB — COMPLETE METABOLIC PANEL WITH GFR
Albumin: 4.2
CHLORIDE: 109
CO2: 8.9
Calcium: 8.9
GFR CALC NON AF AMER: 21
Globulin: 2.6
Protein: 6.8

## 2017-07-22 ENCOUNTER — Encounter: Payer: Self-pay | Admitting: Internal Medicine

## 2017-07-22 ENCOUNTER — Non-Acute Institutional Stay (SKILLED_NURSING_FACILITY): Payer: Medicare Other | Admitting: Internal Medicine

## 2017-07-22 DIAGNOSIS — F0150 Vascular dementia without behavioral disturbance: Secondary | ICD-10-CM

## 2017-07-22 DIAGNOSIS — I5022 Chronic systolic (congestive) heart failure: Secondary | ICD-10-CM | POA: Diagnosis not present

## 2017-07-22 DIAGNOSIS — N184 Chronic kidney disease, stage 4 (severe): Secondary | ICD-10-CM

## 2017-07-22 DIAGNOSIS — Z8673 Personal history of transient ischemic attack (TIA), and cerebral infarction without residual deficits: Secondary | ICD-10-CM

## 2017-07-22 DIAGNOSIS — I110 Hypertensive heart disease with heart failure: Secondary | ICD-10-CM | POA: Diagnosis not present

## 2017-07-22 DIAGNOSIS — H409 Unspecified glaucoma: Secondary | ICD-10-CM

## 2017-07-22 DIAGNOSIS — K219 Gastro-esophageal reflux disease without esophagitis: Secondary | ICD-10-CM

## 2017-07-22 NOTE — Progress Notes (Signed)
Location:  [facility] Room Number: 5 Place of Service:  SNF [phone]) Provider:  [name] MD  [name], MD  Patient Care Team: [name], MD as PCP - General (Internal Medicine) Juanita Craver, MD as Consulting Physician (Gastroenterology) Shon Hough, MD as Consulting Physician (Ophthalmology) Thornell Sartorius, MD as Consulting Physician (Otolaryngology) [place], [place] Mast, Man X, NP as Nurse Practitioner (Nurse Practitioner) Bo Merino, MD as Consulting Physician (Rheumatology) Suella Broad, MD as Consulting Physician (Physical Medicine and Rehabilitation) Netta Cedars, MD as Consulting Physician (Orthopedic Surgery) Fanny Skates, MD as Consulting Physician (General Surgery) Truitt Merle, MD as Consulting Physician (Hematology) Thea Silversmith, MD as Consulting Physician (Radiation Oncology) Rockwell Germany, RN as Registered Nurse Mauro Kaufmann, RN as Registered Nurse Jake Shark, Johny Blamer, NP as Nurse Practitioner (Nurse Practitioner) [name], MD as Consulting Physician (Cardiology)  Extended Emergency Contact Information Primary Emergency Contact: [name],[name] Address: [address]          [place] [address] [place] Phone: [phone] Mobile Phone: [phone] Relation: Daughter Secondary Emergency Contact: [name],[name]  [place] Mobile Phone: [phone] Relation: None    Code Status:  DNR  Goals of care: Advanced Directive information Advanced Directives 07/22/2017  Does Patient Have a Medical Advance Directive? Yes  Type of Advance Directive Out of facility DNR (pink MOST or yellow form)  Does patient want to make changes to medical advance directive? No - Patient declined  Copy of [place] in Chart? -  Pre-existing out of facility DNR order (yellow form or pink MOST form) Yellow form placed in chart (order not valid for  inpatient use);Pink MOST form placed in chart (order not valid for inpatient use)     Chief Complaint  Patient presents with  . Medical Management of Chronic Issues    Routine Visit     HPI:  Pt is a 84 y.o. female seen today for medical management of chronic diseases. She resides in SNF. No acute concern from nursing. Patient denies any acute health concern. She is pleasantly confused.   History of CVA- takes atorvastatin 10 mg daily with coreg 12.5 mg bid  Dementia- no acute behavior changes reported. Gets assistance with her ADLs.   gerd- denies reflux or heartburn, taking famotidine 20 mg daily  Hypertensive heart disease- with diastolic CHF, denies any dyspnea, ambulates with wheelchair. Currently on clonidine 0.1 mg bid, coreg 12.5 mg bid, torsemide 10 mg daily and kcl supplement with amlodipine 5 mg daily and imdur 90 mg qhs. On chart reviewed several SBP in 140-180 range.   ckd 4- urine output is good. No acute confusion noted.   Glaucoma- taking timolol with xalatan and refresh plus at present   Past Medical History:  Diagnosis Date  . Arthritis    Osteoarthritis  . Cancer of left breast [facility]) August 2016  . Cerebral atherosclerosis 12/19/2012  . Cerebral atrophy 12/19/2012  . Cerebral ischemia 12/19/2012   Microvascular   . CKD (chronic kidney disease) stage 3, GFR 30-59 ml/min (HCC) 11/03/2014  . Cough   . Dementia   . Diverticulosis of colon (without mention of hemorrhage)   . Edema   . Family history of breast cancer   . Flat feet   . GERD (gastroesophageal reflux disease)   . Glaucoma   . Heart murmur   . Hyperlipidemia   . Hypertension   . Hypertonicity of bladder   . Ischemic cardiomyopathy 11/04/2014  .  Lumbago   . Memory change 03/28/2016  . Osteoarthrosis, unspecified whether generalized or localized, unspecified site   . Other malaise and fatigue   . Pain in joint, pelvic region and thigh   . Pain in joint, shoulder region   . Personal history of  fall   . RLQ abdominal pain 01/17/2011  . Senile osteoporosis   . Small bowel obstruction ([place])   . STEMI (ST elevation myocardial infarction) ([place]) 11/04/2014  . Unstable gait 11/04/2014  . Urine, incontinence, stress female 09/18/2014   Past Surgical History:  Procedure Laterality Date  . BREAST LUMPECTOMY Bilateral   . BREAST LUMPECTOMY WITH RADIOACTIVE SEED LOCALIZATION Left 03/15/2015   Procedure: LEFT BREAST LUMPECTOMY WITH RADIOACTIVE SEED LOCALIZATION;  Surgeon: Fanny Skates, MD;  Location: [facility];  Service: General;  Laterality: Left;  . CARDIAC CATHETERIZATION N/A 11/08/2014   Procedure: Left Heart Cath and Coronary Angiography;  Surgeon: [name], MD;  Location: [facility];  Service: Cardiovascular;  Laterality: N/A;  . COLON SURGERY  02/1996   Colectomy, partial for diverticular bleed 90%  . EYE SURGERY    . JOINT REPLACEMENT Left 12/2000   Left knee; Dr. Wynelle Link  . LAPAROSCOPIC LYSIS OF ADHESIONS  11/15/10   Exploratory  . TONSILLECTOMY      Allergies  Allergen Reactions  . Sulfa Antibiotics Itching    Outpatient Encounter Medications as of 07/22/2017  Medication Sig  . acetaminophen (TYLENOL) 325 MG tablet Take 650 mg by mouth 2 (two) times daily. Take 2 tablets every 6 hours as needed for mild pain or fever  . acetaminophen (TYLENOL) 325 MG tablet Take 650 mg by mouth 3 (three) times daily.  Marland Kitchen amLODipine (NORVASC) 5 MG tablet Take 5 mg by mouth daily.  Marland Kitchen atorvastatin (LIPITOR) 10 MG tablet Take 10 mg by mouth daily.  . carboxymethylcellulose (REFRESH PLUS) 0.5 % SOLN Apply 2 drops to eye 2 (two) times daily.  . carvedilol (COREG) 12.5 MG tablet Take 12.5 mg by mouth 2 (two) times daily with a meal.  . cloNIDine (CATAPRES) 0.1 MG tablet Take 0.1 mg by mouth 2 (two) times daily.  . Coenzyme Q10 50 MG TABS Take one tablet by mouth at bedtime  . famotidine (PEPCID) 20 MG tablet Take 20 mg by mouth at bedtime.   . ferrous sulfate 325 (65 FE) MG tablet Take  325 mg by mouth daily with breakfast.  . folic acid (FOLVITE) 1 MG tablet Take 1 mg by mouth daily.  . isosorbide mononitrate (IMDUR) 60 MG 24 hr tablet Take 90 mg by mouth at bedtime.   Marland Kitchen KLOR-CON 10 10 MEQ tablet Take 10 mEq by mouth daily.  Marland Kitchen latanoprost (XALATAN) 0.005 % ophthalmic solution Place 1 drop into both eyes at bedtime.   . timolol (TIMOPTIC-XR) 0.5 % ophthalmic gel-forming 1 drop daily.  Marland Kitchen torsemide (DEMADEX) 10 MG tablet Take 10 mg daily by mouth. Hold for SBP < 110  . vitamin B-12 (CYANOCOBALAMIN) 1000 MCG tablet Take 1,000 mcg See admin instructions by mouth. Take 1000 mcg by mouth daily On Mondays,Wednesdays and Fridays  . [DISCONTINUED] amLODipine (NORVASC) 2.5 MG tablet Take 5 mg by mouth daily.   No facility-administered encounter medications on file as of 07/22/2017.     Review of Systems  Unable to perform ROS: Dementia (limited due to)  Constitutional: Negative for appetite change and fever.  HENT: Negative for congestion and rhinorrhea.   Eyes: Positive for visual disturbance.  Respiratory: Negative for cough and  shortness of breath.   Cardiovascular: Positive for leg swelling. Negative for chest pain and palpitations.  Gastrointestinal: Negative for abdominal pain, constipation, diarrhea, nausea and vomiting.  Genitourinary: Negative for dysuria.  Musculoskeletal: Positive for arthralgias. Negative for back pain.  Skin: Negative for rash.  Neurological: Negative for headaches.  Psychiatric/Behavioral: Positive for confusion.    Immunization History  Administered Date(s) Administered  . Influenza Whole 11/19/2011, 12/02/2012  . Influenza-Unspecified 12/06/2013, 12/06/2015, 12/02/2016  . PPD Test 11/10/2014  . Pneumococcal Conjugate-13 03/09/2009  . Pneumococcal Polysaccharide-23 02/18/2009  . Tdap 04/02/2016   Pertinent  Health Maintenance Due  Topic Date Due  . INFLUENZA VACCINE  09/18/2017  . MAMMOGRAM  11/26/2017  . DEXA SCAN  Completed  . PNA vac  Low Risk Adult  Completed   Fall Risk  02/05/2017 01/31/2017 07/13/2015 11/17/2014 04/21/2014  Falls in the past year? No No No Yes Yes  Number falls in past yr: - - - 1 1  Injury with Fall? - - - No No  Comment - - - - -  Risk for fall due to : - History of fall(s);Impaired balance/gait;Impaired vision - Impaired balance/gait;History of fall(s) -   Functional Status Survey:    Vitals:   07/22/17 1438  BP: (!) 150/84  Pulse: 70  Resp: 20  Temp: (!) 96.4 F (35.8 C)  TempSrc: Oral  SpO2: 96%  Weight: 153 lb 11.2 oz (69.7 kg)  Height: 5\' 5"  (1.651 m)   Body mass index is 25.58 kg/m.   Wt Readings from Last 3 Encounters:  07/22/17 153 lb 11.2 oz (69.7 kg)  07/09/17 151 lb 9.6 oz (68.8 kg)  06/12/17 152 lb 1.6 oz (69 kg)   Physical Exam  Constitutional: She appears well-developed and well-nourished. No distress.  HENT:  Head: Normocephalic and atraumatic.  Right Ear: External ear normal.  Left Ear: External ear normal.  Mouth/Throat: Oropharynx is clear and moist. No oropharyngeal exudate.  Eyes: Pupils are equal, round, and reactive to light. Conjunctivae and EOM are normal. Right eye exhibits no discharge. Left eye exhibits no discharge.  Has corrective glasses  Neck: Normal range of motion. Neck supple.  Cardiovascular: Normal rate, regular rhythm and intact distal pulses.  Murmur heard. Pulmonary/Chest: Effort normal and breath sounds normal. No respiratory distress. She has no wheezes. She has no rales.  Abdominal: Soft. Bowel sounds are normal. There is no tenderness.  Musculoskeletal: She exhibits edema.  Able to move all 4 extremities, unsteady gait, uses wheelchair for ambulation  Lymphadenopathy:    She has no cervical adenopathy.  Neurological: She is alert.  Oriented to self and place  Skin: Skin is warm and dry. She is not diaphoretic.  Psychiatric: She has a normal mood and affect.    Labs reviewed: Recent Labs    08/25/16 0105 08/26/16 0427   03/31/17 0836 05/22/17 06/30/17 0820 07/10/17  NA 138 138   < > 139 140 140 140  K 3.4* 4.0   < > 4.0 4.3 3.7 3.5  CL 106 105  --  106  --  112* 109  CO2 22 21*  --  23  --  19* 8.9  GLUCOSE 161* 137*  --  97  --  141*  --   BUN 41* 64*   < > 25 31* 26 24*  CREATININE 2.20* 2.90*   < > 2.22* 2.3* 2.19* 2.1*  CALCIUM 8.5* 8.2*  --  9.3  --  8.8 8.9  MG 1.9  --   --   --   --   --   --    < > =  values in this interval not displayed.   Recent Labs    08/24/16 1053  03/31/17 0836 05/22/17 06/30/17 0820 07/10/17  AST 17   < > 15 11* 12 13  ALT 9*   < > <6 4* 9 5*  ALKPHOS 42   < > 53 39 46 40  BILITOT 0.9  --  0.5  --  0.4  --   PROT 7.1  --  7.1  --  6.9  --   ALBUMIN 3.8  --  3.8  --  3.9 4.2   < > = values in this interval not displayed.   Recent Labs    08/24/16 1053  08/26/16 0427  03/31/17 0836  05/27/17 06/30/17 0820 07/01/17  WBC 10.8*   < > 12.9*   < > 4.9   < > 7.1 6.0 6.0  NEUTROABS 8.2*  --   --   --  3.3  --   --  4.1  --   HGB 12.2   < > 10.0*   < > 10.9*   < > 9.3* 10.6* 10.8*  HCT 36.3   < > 30.6*   < > 33.7*   < > 29* 34.0* 33*  MCV 89.9   < > 86.4  --  90.3  --   --  91.2  --   PLT 186   < > 197   < > 190   < > 228 208 225   < > = values in this interval not displayed.   Lab Results  Component Value Date   TSH 2.21 04/04/2016   Lab Results  Component Value Date   HGBA1C 5.6 11/06/2014   Lab Results  Component Value Date   CHOL 110 04/30/2017   HDL 41 04/30/2017   LDLCALC 50 04/30/2017   TRIG 111 04/30/2017   CHOLHDL 3.1 11/06/2014    Significant Diagnostic Results in last 30 days:  No results found.  Assessment/Plan  1. Hypertensive heart disease with chronic systolic congestive heart failure (HCC) Uncontrolled BP. Reviewed bmp. No signs of fluid overload. Continue torsemide 10 mg daily with imdur 90 mg daily, clonidine 0.1 mg bid, coreg 12.5 mg bid and torsemide 10 mg daily. Continue kcl supplement. D/c amlodipine with leg edema. Start  hydralazine 25 mg po bid. If this helps with BP control, titrate this further and slow wean her off clonidine. Monitor BP bid x 1 week, then daily x 2 weeks and reassess if needed.   2. Gastroesophageal reflux disease without esophagitis Continue famotidine 20 mg daily  3. Vascular dementia without behavioral disturbance Supportive care. Continue statin.   4. CKD (chronic kidney disease) stage 4, GFR 15-29 ml/min (HCC) Monitor kidney function, BP needs better control, change to regimen as above  5. History of CVA (cerebrovascular accident) Continue statin, LDL at goal. Continue coreg current regimen  6. Glaucoma of both eyes, unspecified glaucoma type Continue timolol with xalatan and refresh plus     Family/ staff Communication: reviewed care plan with patient and charge nurse.    Labs/tests ordered:  none   [name], MD Internal Medicine [facility] [address] [place], [place] [address] Cell Phone (Monday-Friday 8 am - 5 pm): [phone] On Call: [phone] and follow prompts after 5 pm and on weekends Office Phone: [phone] Office Fax: [phone]

## 2017-08-19 ENCOUNTER — Non-Acute Institutional Stay (SKILLED_NURSING_FACILITY): Payer: Medicare Other | Admitting: Nurse Practitioner

## 2017-08-19 ENCOUNTER — Encounter: Payer: Self-pay | Admitting: Nurse Practitioner

## 2017-08-19 DIAGNOSIS — D509 Iron deficiency anemia, unspecified: Secondary | ICD-10-CM | POA: Diagnosis not present

## 2017-08-19 DIAGNOSIS — I110 Hypertensive heart disease with heart failure: Secondary | ICD-10-CM | POA: Diagnosis not present

## 2017-08-19 DIAGNOSIS — M25561 Pain in right knee: Secondary | ICD-10-CM | POA: Diagnosis not present

## 2017-08-19 DIAGNOSIS — I10 Essential (primary) hypertension: Secondary | ICD-10-CM | POA: Diagnosis not present

## 2017-08-19 DIAGNOSIS — K219 Gastro-esophageal reflux disease without esophagitis: Secondary | ICD-10-CM | POA: Diagnosis not present

## 2017-08-19 DIAGNOSIS — F0150 Vascular dementia without behavioral disturbance: Secondary | ICD-10-CM

## 2017-08-19 DIAGNOSIS — I5022 Chronic systolic (congestive) heart failure: Secondary | ICD-10-CM | POA: Diagnosis not present

## 2017-08-19 NOTE — Assessment & Plan Note (Signed)
No longer ambulatory, continue Tylenol, w/c for mobility.

## 2017-08-19 NOTE — Assessment & Plan Note (Addendum)
Better controlled blood pressures, but still has 180s/80s, she is asymptomatic, continue Carvedilol, increase  Hydralazine 25mg  tid, Bp bid, re-evaluate in 3-4 days. Observe.

## 2017-08-19 NOTE — Progress Notes (Addendum)
Location:   SNF [place] Room Number: 5 Place of Service:  SNF (31) Provider: [facility] Kamdon Reisig NP  [name], MD  Patient Care Team: [name], MD as PCP - General (Internal Medicine) Juanita Craver, MD as Consulting Physician (Gastroenterology) Shon Hough, MD as Consulting Physician (Ophthalmology) Thornell Sartorius, MD as Consulting Physician (Otolaryngology) [place] Fallon Howerter X, NP as Nurse Practitioner (Nurse Practitioner) Bo Merino, MD as Consulting Physician (Rheumatology) Suella Broad, MD as Consulting Physician (Physical Medicine and Rehabilitation) Netta Cedars, MD as Consulting Physician (Orthopedic Surgery) Fanny Skates, MD as Consulting Physician (General Surgery) Truitt Merle, MD as Consulting Physician (Hematology) Thea Silversmith, MD as Consulting Physician (Radiation Oncology) Rockwell Germany, RN as Registered Nurse Mauro Kaufmann, RN as Registered Nurse Jake Shark, Johny Blamer, NP as Nurse Practitioner (Nurse Practitioner) [name], MD as Consulting Physician (Cardiology)  Extended Emergency Contact Information Primary Emergency Contact: [name],[name] Address: [address]          [place] [address] [place] Phone: [phone] Mobile Phone: [phone] Relation: Daughter Secondary Emergency Contact: [name],[name]  [place] Mobile Phone: [phone] Relation: None  Code Status:  DNR Goals of care: Advanced Directive information Advanced Directives 08/19/2017  Does Patient Have a Medical Advance Directive? Yes  Type of Advance Directive Out of facility DNR (pink MOST or yellow form)  Does patient want to make changes to medical advance directive? No - Patient declined  Copy of [place] in Chart? -  Pre-existing out of facility DNR order (yellow form or pink MOST form) Yellow form placed in chart (order not valid for inpatient use);Pink  MOST form placed in chart (order not valid for inpatient use)     Chief Complaint  Patient presents with  . Medical Management of Chronic Issues    Routine Visit     HPI:  Pt is a 84 y.o. female seen today for medical management of chronic diseases.     The patient has history of HTN, blood pressure is better controlled since off Amlodipine, started Hydralazine 25mg  bid 07/22/17, continued Carvedilol 12.5mg  bid. Hx of anemia, stable, on Vit Y30, Fe, Folic acid, last Hgb 16.0 07/01/17. CHF, compensated clinically, on Torsemide 10mg  qd, trace edema BLE. No angina since last visited, on Isosorbide 90mg  qd. GERD stable on Famotidine. She has chronic right knee pain, managed with Tylenol 650mg  tid and w/c. She resides in [facility], w/c for mobility.  Past Medical History:  Diagnosis Date  . Arthritis    Osteoarthritis  . Cancer of left breast [facility]) August 2016  . Cerebral atherosclerosis 12/19/2012  . Cerebral atrophy 12/19/2012  . Cerebral ischemia 12/19/2012   Microvascular   . CKD (chronic kidney disease) stage 3, GFR 30-59 ml/min (HCC) 11/03/2014  . Cough   . Dementia   . Diverticulosis of colon (without mention of hemorrhage)   . Edema   . Family history of breast cancer   . Flat feet   . GERD (gastroesophageal reflux disease)   . Glaucoma   . Heart murmur   . Hyperlipidemia   . Hypertension   . Hypertonicity of bladder   . Ischemic cardiomyopathy 11/04/2014  . Lumbago   . Memory change 03/28/2016  . Osteoarthrosis, unspecified whether generalized or localized, unspecified site   . Other malaise and fatigue   . Pain in joint, pelvic region and thigh   . Pain in joint, shoulder region   . Personal history of fall   .  RLQ abdominal pain 01/17/2011  . Senile osteoporosis   . Small bowel obstruction ([place])   . STEMI (ST elevation myocardial infarction) ([place]) 11/04/2014  . Unstable gait 11/04/2014  . Urine, incontinence, stress female 09/18/2014   Past Surgical History:  Procedure  Laterality Date  . BREAST LUMPECTOMY Bilateral   . BREAST LUMPECTOMY WITH RADIOACTIVE SEED LOCALIZATION Left 03/15/2015   Procedure: LEFT BREAST LUMPECTOMY WITH RADIOACTIVE SEED LOCALIZATION;  Surgeon: Fanny Skates, MD;  Location: [place];  Service: General;  Laterality: Left;  . CARDIAC CATHETERIZATION N/A 11/08/2014   Procedure: Left Heart Cath and Coronary Angiography;  Surgeon: [name], MD;  Location: [place] CV LAB;  Service: Cardiovascular;  Laterality: N/A;  . COLON SURGERY  02/1996   Colectomy, partial for diverticular bleed 90%  . EYE SURGERY    . JOINT REPLACEMENT Left 12/2000   Left knee; Dr. Wynelle Link  . LAPAROSCOPIC LYSIS OF ADHESIONS  11/15/10   Exploratory  . TONSILLECTOMY      Allergies  Allergen Reactions  . Sulfa Antibiotics Itching    Allergies as of 08/19/2017      Reactions   Sulfa Antibiotics Itching      Medication List        Accurate as of 08/19/17 11:59 PM. Always use your most recent med list.          acetaminophen 325 MG tablet Commonly known as:  TYLENOL Take 650 mg by mouth every 6 (six) hours as needed.   acetaminophen 325 MG tablet Commonly known as:  TYLENOL Take 650 mg by mouth 3 (three) times daily.   atorvastatin 10 MG tablet Commonly known as:  LIPITOR Take 10 mg by mouth daily.   carboxymethylcellulose 0.5 % Soln Commonly known as:  REFRESH PLUS Apply 2 drops to eye 2 (two) times daily.   carvedilol 12.5 MG tablet Commonly known as:  COREG Take 12.5 mg by mouth 2 (two) times daily with a meal.   cloNIDine 0.1 MG tablet Commonly known as:  CATAPRES Take 0.1 mg by mouth 2 (two) times daily.   Coenzyme Q10 50 MG Tabs Take one tablet by mouth at bedtime   famotidine 20 MG tablet Commonly known as:  PEPCID Take 20 mg by mouth at bedtime.   ferrous sulfate 325 (65 FE) MG tablet Take 325 mg by mouth daily with breakfast.   folic acid 1 MG tablet Commonly known as:  FOLVITE Take 1 mg by mouth daily.   hydrALAZINE  25 MG tablet Commonly known as:  APRESOLINE Take 25 mg by mouth 2 (two) times daily.   isosorbide mononitrate 60 MG 24 hr tablet Commonly known as:  IMDUR Take 90 mg by mouth at bedtime.   KLOR-CON 10 10 MEQ tablet Generic drug:  potassium chloride Take 10 mEq by mouth daily.   latanoprost 0.005 % ophthalmic solution Commonly known as:  XALATAN Place 1 drop into both eyes at bedtime.   timolol 0.5 % ophthalmic gel-forming Commonly known as:  TIMOPTIC-XR Place 1 drop into both eyes daily.   torsemide 10 MG tablet Commonly known as:  DEMADEX Take 10 mg daily by mouth. Hold for SBP < 110   vitamin B-12 1000 MCG tablet Commonly known as:  CYANOCOBALAMIN Take 1,000 mcg See admin instructions by mouth. Take 1000 mcg by mouth daily On Mondays,Wednesdays and Fridays       Review of Systems  Constitutional: Negative for activity change, appetite change, chills, diaphoresis, fatigue and fever.  HENT: Positive for hearing  loss. Negative for congestion, trouble swallowing and voice change.   Eyes: Positive for visual disturbance.  Respiratory: Negative for cough, shortness of breath and wheezing.   Cardiovascular: Positive for leg swelling. Negative for chest pain and palpitations.  Gastrointestinal: Negative for abdominal distention, abdominal pain, constipation, diarrhea, nausea and vomiting.  Genitourinary: Negative for difficulty urinating, dysuria and urgency.  Musculoskeletal: Positive for arthralgias and gait problem.  Skin: Negative for color change and pallor.  Neurological: Negative for dizziness, speech difficulty, weakness and headaches.       Dementia  Psychiatric/Behavioral: Positive for confusion. Negative for agitation, behavioral problems, hallucinations and sleep disturbance. The patient is not nervous/anxious.     Immunization History  Administered Date(s) Administered  . Influenza Whole 11/19/2011, 12/02/2012  . Influenza-Unspecified 12/06/2013, 12/06/2015,  12/02/2016  . PPD Test 11/10/2014  . Pneumococcal Conjugate-13 03/09/2009  . Pneumococcal Polysaccharide-23 02/18/2009  . Tdap 04/02/2016   Pertinent  Health Maintenance Due  Topic Date Due  . INFLUENZA VACCINE  09/18/2017  . MAMMOGRAM  11/26/2017  . DEXA SCAN  Completed  . PNA vac Low Risk Adult  Completed   Fall Risk  02/05/2017 01/31/2017 07/13/2015 11/17/2014 04/21/2014  Falls in the past year? No No No Yes Yes  Number falls in past yr: - - - 1 1  Injury with Fall? - - - No No  Comment - - - - -  Risk for fall due to : - History of fall(s);Impaired balance/gait;Impaired vision - Impaired balance/gait;History of fall(s) -   Functional Status Survey:    Vitals:   08/19/17 1546  BP: (!) 144/82  Pulse: 63  Resp: 20  Temp: 97.8 F (36.6 C)  TempSrc: Oral  SpO2: 97%  Weight: 155 lb 8 oz (70.5 kg)  Height: 5\' 5"  (1.651 m)   Body mass index is 25.88 kg/m. Physical Exam  Constitutional: She appears well-developed and well-nourished.  HENT:  Head: Normocephalic and atraumatic.  Eyes: Pupils are equal, round, and reactive to light. EOM are normal.  Neck: Normal range of motion. Neck supple. No JVD present. No thyromegaly present.  Cardiovascular: Normal rate and regular rhythm.  Murmur heard. Pulmonary/Chest: She has no wheezes. She has no rales.  Abdominal: Soft. Bowel sounds are normal. She exhibits no distension. There is no tenderness.  Musculoskeletal: She exhibits edema.  Chronic right knee pain, trace edema BLE, one person transfer, w/c for mobility.   Neurological: She is alert. No cranial nerve deficit. She exhibits normal muscle tone. Coordination normal.  Oriented to person and place.   Skin: Skin is warm and dry.  Psychiatric: She has a normal mood and affect. Her behavior is normal.    Labs reviewed: Recent Labs    08/25/16 0105 08/26/16 0427  03/31/17 0836 05/22/17 06/30/17 0820 07/10/17  NA 138 138   < > 139 140 140 140  K 3.4* 4.0   < > 4.0 4.3 3.7  3.5  CL 106 105  --  106  --  112* 109  CO2 22 21*  --  23  --  19* 8.9  GLUCOSE 161* 137*  --  97  --  141*  --   BUN 41* 64*   < > 25 31* 26 24*  CREATININE 2.20* 2.90*   < > 2.22* 2.3* 2.19* 2.1*  CALCIUM 8.5* 8.2*  --  9.3  --  8.8 8.9  MG 1.9  --   --   --   --   --   --    < > =  values in this interval not displayed.   Recent Labs    08/24/16 1053  03/31/17 0836 05/22/17 06/30/17 0820 07/10/17  AST 17   < > 15 11* 12 13  ALT 9*   < > <6 4* 9 5*  ALKPHOS 42   < > 53 39 46 40  BILITOT 0.9  --  0.5  --  0.4  --   PROT 7.1  --  7.1  --  6.9  --   ALBUMIN 3.8  --  3.8  --  3.9 4.2   < > = values in this interval not displayed.   Recent Labs    08/24/16 1053  08/26/16 0427  03/31/17 0836  05/27/17 06/30/17 0820 07/01/17  WBC 10.8*   < > 12.9*   < > 4.9   < > 7.1 6.0 6.0  NEUTROABS 8.2*  --   --   --  3.3  --   --  4.1  --   HGB 12.2   < > 10.0*   < > 10.9*   < > 9.3* 10.6* 10.8*  HCT 36.3   < > 30.6*   < > 33.7*   < > 29* 34.0* 33*  MCV 89.9   < > 86.4  --  90.3  --   --  91.2  --   PLT 186   < > 197   < > 190   < > 228 208 225   < > = values in this interval not displayed.   Lab Results  Component Value Date   TSH 2.21 04/04/2016   Lab Results  Component Value Date   HGBA1C 5.6 11/06/2014   Lab Results  Component Value Date   CHOL 110 04/30/2017   HDL 41 04/30/2017   LDLCALC 50 04/30/2017   TRIG 111 04/30/2017   CHOLHDL 3.1 11/06/2014    Significant Diagnostic Results in last 30 days:  No results found.  Assessment/Plan  Hypertension Better controlled blood pressures, but still has 180s/80s, she is asymptomatic, continue Carvedilol, increase  Hydralazine 25mg  tid, Bp bid, re-evaluate in 3-4 days. Observe.   Hypertensive heart disease with chronic systolic congestive heart failure ([place]) Compensated clinically, trace edema seen in BLE, no angina since last visited, continue Isosorbide,  continue Torsemide 10mg  daily.   GERD (gastroesophageal reflux  disease) Stable, continue Famotidine.   Vascular dementia Continue SN[facility] for care assistance, she needs assistance with transfer.   Anemia Her baseline Hgb 10s, stable, continue Vit G50, Folic acid, Fe  Right knee pain No longer ambulatory, continue Tylenol, w/c for mobility.    Family/ staff Communication: plan of care reviewed with the patient and charge nurse.   Labs/tests ordered:  None  Time spend 25 minutes.

## 2017-08-19 NOTE — Assessment & Plan Note (Signed)
Her baseline Hgb 10s, stable, continue Vit I26, Folic acid, Fe

## 2017-08-19 NOTE — Assessment & Plan Note (Signed)
Continue SN[facility] for care assistance, she needs assistance with transfer.

## 2017-08-19 NOTE — Assessment & Plan Note (Signed)
Stable, continue Famotidine.  

## 2017-08-19 NOTE — Assessment & Plan Note (Addendum)
Compensated clinically, trace edema seen in BLE, no angina since last visited, continue Isosorbide,  continue Torsemide 10mg  daily.

## 2017-08-28 ENCOUNTER — Non-Acute Institutional Stay (SKILLED_NURSING_FACILITY): Payer: Medicare Other | Admitting: Internal Medicine

## 2017-08-28 ENCOUNTER — Encounter: Payer: Self-pay | Admitting: Internal Medicine

## 2017-08-28 DIAGNOSIS — N184 Chronic kidney disease, stage 4 (severe): Secondary | ICD-10-CM | POA: Diagnosis not present

## 2017-08-28 DIAGNOSIS — I110 Hypertensive heart disease with heart failure: Secondary | ICD-10-CM | POA: Diagnosis not present

## 2017-08-28 DIAGNOSIS — I5022 Chronic systolic (congestive) heart failure: Secondary | ICD-10-CM

## 2017-08-28 NOTE — Progress Notes (Signed)
Location:  [place] Room Number: 5 Place of Service:  SNF [phone]) Provider:  [name], MD  [name], MD  Patient Care Team: [name], MD as PCP - General (Internal Medicine) Juanita Craver, MD as Consulting Physician (Gastroenterology) Shon Hough, MD as Consulting Physician (Ophthalmology) Thornell Sartorius, MD as Consulting Physician (Otolaryngology) [place] Mast, Man X, NP as Nurse Practitioner (Nurse Practitioner) Bo Merino, MD as Consulting Physician (Rheumatology) Suella Broad, MD as Consulting Physician (Physical Medicine and Rehabilitation) Netta Cedars, MD as Consulting Physician (Orthopedic Surgery) Fanny Skates, MD as Consulting Physician (General Surgery) Truitt Merle, MD as Consulting Physician (Hematology) Thea Silversmith, MD as Consulting Physician (Radiation Oncology) Rockwell Germany, RN as Registered Nurse Mauro Kaufmann, RN as Registered Nurse Jake Shark, Johny Blamer, NP as Nurse Practitioner (Nurse Practitioner) [name], MD as Consulting Physician (Cardiology)  Extended Emergency Contact Information Primary Emergency Contact: [name],[name] Address: [address]          [place] [address] [place] of [place] Phone: [phone] Mobile Phone: [phone] Relation: Daughter Secondary Emergency Contact: [name],[name]  [place] Mobile Phone: [phone] Relation: None  Code Status:  DNR  Goals of care: Advanced Directive information Advanced Directives 08/28/2017  Does Patient Have a Medical Advance Directive? Yes  Type of Advance Directive Out of facility DNR (pink MOST or yellow form)  Does patient want to make changes to medical advance directive? No - Patient declined  Copy of [place] in Chart? -  Pre-existing out of facility DNR order (yellow form or pink MOST form) Yellow form placed in chart (order not valid for  inpatient use);Pink MOST form placed in chart (order not valid for inpatient use)     Chief Complaint  Patient presents with  . Acute Visit    Elevated blood pressure readings     HPI:  Pt is a 84 y.o. female seen today for an acute visit for elevated BP readings. She is on multiple BP medication. She is seen in her room today.reviewed BP reading at facility- 185/90, 182/70, 186/80, 178/90, 162/80 and 158/74 over last week. She is pleasantly confused and not a great historian. She denies pain this visit.    Past Medical History:  Diagnosis Date  . Arthritis    Osteoarthritis  . Cancer of left breast [facility]) August 2016  . Cerebral atherosclerosis 12/19/2012  . Cerebral atrophy 12/19/2012  . Cerebral ischemia 12/19/2012   Microvascular   . CKD (chronic kidney disease) stage 3, GFR 30-59 ml/min (HCC) 11/03/2014  . Cough   . Dementia   . Diverticulosis of colon (without mention of hemorrhage)   . Edema   . Family history of breast cancer   . Flat feet   . GERD (gastroesophageal reflux disease)   . Glaucoma   . Heart murmur   . Hyperlipidemia   . Hypertension   . Hypertonicity of bladder   . Ischemic cardiomyopathy 11/04/2014  . Lumbago   . Memory change 03/28/2016  . Osteoarthrosis, unspecified whether generalized or localized, unspecified site   . Other malaise and fatigue   . Pain in joint, pelvic region and thigh   . Pain in joint, shoulder region   . Personal history of fall   . RLQ abdominal pain 01/17/2011  . Senile osteoporosis   . Small bowel obstruction ([place])   . STEMI (ST elevation myocardial infarction) ([place]) 11/04/2014  . Unstable gait 11/04/2014  . Urine, incontinence, stress female  09/18/2014   Past Surgical History:  Procedure Laterality Date  . BREAST LUMPECTOMY Bilateral   . BREAST LUMPECTOMY WITH RADIOACTIVE SEED LOCALIZATION Left 03/15/2015   Procedure: LEFT BREAST LUMPECTOMY WITH RADIOACTIVE SEED LOCALIZATION;  Surgeon: Fanny Skates, MD;  Location: [place];  Service: General;  Laterality: Left;  . CARDIAC CATHETERIZATION N/A 11/08/2014   Procedure: Left Heart Cath and Coronary Angiography;  Surgeon: [name], MD;  Location: [place] CV LAB;  Service: Cardiovascular;  Laterality: N/A;  . COLON SURGERY  02/1996   Colectomy, partial for diverticular bleed 90%  . EYE SURGERY    . JOINT REPLACEMENT Left 12/2000   Left knee; Dr. Wynelle Link  . LAPAROSCOPIC LYSIS OF ADHESIONS  11/15/10   Exploratory  . TONSILLECTOMY      Allergies  Allergen Reactions  . Sulfa Antibiotics Itching    Outpatient Encounter Medications as of 08/28/2017  Medication Sig  . acetaminophen (TYLENOL) 325 MG tablet Take 650 mg by mouth every 6 (six) hours as needed.   Marland Kitchen acetaminophen (TYLENOL) 325 MG tablet Take 650 mg by mouth 3 (three) times daily.  Marland Kitchen atorvastatin (LIPITOR) 10 MG tablet Take 10 mg by mouth daily.  . carboxymethylcellulose (REFRESH PLUS) 0.5 % SOLN Apply 2 drops to eye 2 (two) times daily.  . carvedilol (COREG) 12.5 MG tablet Take 12.5 mg by mouth 2 (two) times daily with a meal.  . cloNIDine (CATAPRES) 0.1 MG tablet Take 0.1 mg by mouth 2 (two) times daily.  . Coenzyme Q10 50 MG TABS Take one tablet by mouth at bedtime  . famotidine (PEPCID) 20 MG tablet Take 20 mg by mouth at bedtime.   . ferrous sulfate 325 (65 FE) MG tablet Take 325 mg by mouth daily with breakfast.  . folic acid (FOLVITE) 1 MG tablet Take 1 mg by mouth daily.  . hydrALAZINE (APRESOLINE) 25 MG tablet Take 25 mg by mouth 3 (three) times daily.   . isosorbide mononitrate (IMDUR) 60 MG 24 hr tablet Take 90 mg by mouth at bedtime.   Marland Kitchen KLOR-CON 10 10 MEQ tablet Take 10 mEq by mouth daily.  Marland Kitchen latanoprost (XALATAN) 0.005 % ophthalmic solution Place 1 drop into both eyes at bedtime.   . timolol (TIMOPTIC-XR) 0.5 % ophthalmic gel-forming Place 1 drop into both eyes daily.   Marland Kitchen torsemide (DEMADEX) 10 MG tablet Take 10 mg daily by mouth. Hold for SBP < 110  . vitamin B-12 (CYANOCOBALAMIN)  1000 MCG tablet Take 1,000 mcg See admin instructions by mouth. Take 1000 mcg by mouth daily On Mondays,Wednesdays and Fridays   No facility-administered encounter medications on file as of 08/28/2017.     Review of Systems  Unable to perform ROS: Dementia (from pt and nursing)  Constitutional: Negative for appetite change, chills and fever.  HENT: Negative for congestion.   Respiratory: Negative for cough and shortness of breath.   Cardiovascular: Negative for chest pain.  Gastrointestinal: Negative for abdominal pain, diarrhea, nausea and vomiting.  Genitourinary: Negative for dysuria and flank pain.  Musculoskeletal: Positive for gait problem. Negative for back pain.  Neurological: Negative for dizziness and headaches.  Psychiatric/Behavioral: Positive for confusion.    Immunization History  Administered Date(s) Administered  . Influenza Whole 11/19/2011, 12/02/2012  . Influenza-Unspecified 12/06/2013, 12/06/2015, 12/02/2016  . PPD Test 11/10/2014  . Pneumococcal Conjugate-13 03/09/2009  . Pneumococcal Polysaccharide-23 02/18/2009  . Tdap 04/02/2016   Pertinent  Health Maintenance Due  Topic Date Due  . INFLUENZA VACCINE  09/18/2017  .  MAMMOGRAM  11/26/2017  . DEXA SCAN  Completed  . PNA vac Low Risk Adult  Completed   Fall Risk  02/05/2017 01/31/2017 07/13/2015 11/17/2014 04/21/2014  Falls in the past year? No No No Yes Yes  Number falls in past yr: - - - 1 1  Injury with Fall? - - - No No  Comment - - - - -  Risk for fall due to : - History of fall(s);Impaired balance/gait;Impaired vision - Impaired balance/gait;History of fall(s) -   Functional Status Survey:    Vitals:   08/28/17 1227  BP: (!) 185/90  Pulse: 76  Resp: 20  Temp: 97.8 F (36.6 C)  TempSrc: Oral  SpO2: 97%  Weight: 152 lb 6.4 oz (69.1 kg)  Height: 5\' 5"  (1.651 m)   Body mass index is 25.36 kg/m.   BP Readings from Last 3 Encounters:  08/28/17 (!) 185/90  08/19/17 (!) 144/82  07/22/17 (!)  150/84   Physical Exam  Constitutional: No distress.  Frail elderly female  HENT:  Head: Normocephalic and atraumatic.  Nose: Nose normal.  Mouth/Throat: Oropharynx is clear and moist. No oropharyngeal exudate.  Eyes: Pupils are equal, round, and reactive to light. Conjunctivae and EOM are normal. Right eye exhibits no discharge. Left eye exhibits no discharge.  Has corrective glasses  Neck: Normal range of motion. Neck supple.  Cardiovascular: Normal rate and regular rhythm.  Murmur heard. Pulmonary/Chest: Effort normal and breath sounds normal. No respiratory distress. She has no wheezes.  Abdominal: Soft. Bowel sounds are normal. There is no tenderness. There is no guarding.  Musculoskeletal: She exhibits edema.  Can move all 4 extremities, on wheelchair  Lymphadenopathy:    She has no cervical adenopathy.  Neurological: She is alert. She exhibits normal muscle tone.  Oriented to self and place only  Skin: Skin is warm and dry. She is not diaphoretic.  Psychiatric:  Pleasantly confused, participates in conversaton, needs redirection    Labs reviewed: Recent Labs    03/31/17 0836 05/22/17 06/30/17 0820 07/10/17  NA 139 140 140 140  K 4.0 4.3 3.7 3.5  CL 106  --  112* 109  CO2 23  --  19* 8.9  GLUCOSE 97  --  141*  --   BUN 25 31* 26 24*  CREATININE 2.22* 2.3* 2.19* 2.1*  CALCIUM 9.3  --  8.8 8.9   Recent Labs    03/31/17 0836 05/22/17 06/30/17 0820 07/10/17  AST 15 11* 12 13  ALT <6 4* 9 5*  ALKPHOS 53 39 46 40  BILITOT 0.5  --  0.4  --   PROT 7.1  --  6.9  --   ALBUMIN 3.8  --  3.9 4.2   Recent Labs    03/31/17 0836  05/27/17 06/30/17 0820 07/01/17  WBC 4.9   < > 7.1 6.0 6.0  NEUTROABS 3.3  --   --  4.1  --   HGB 10.9*   < > 9.3* 10.6* 10.8*  HCT 33.7*   < > 29* 34.0* 33*  MCV 90.3  --   --  91.2  --   PLT 190   < > 228 208 225   < > = values in this interval not displayed.   Lab Results  Component Value Date   TSH 2.21 04/04/2016   Lab Results    Component Value Date   HGBA1C 5.6 11/06/2014   Lab Results  Component Value Date   CHOL 110 04/30/2017  HDL 41 04/30/2017   LDLCALC 50 04/30/2017   TRIG 111 04/30/2017   CHOLHDL 3.1 11/06/2014    Significant Diagnostic Results in last 30 days:  No results found.  Assessment/Plan  1. Hypertensive heart disease with chronic systolic congestive heart failure ([place]) Several elevated BP reading. Denies pain and no pain on ROM. Currently on imdur 90 mg daily, hydralazine 25 mg tid, coreg 12.5 mg bid, torsemide 10 mg daily and clonidine 0.1 mg bid. Has ckd stage 4. Change hydralazine to 50 mg tid and continue other as current regimen. Monitor BP bid for 2 weeks  2. CKD (chronic kidney disease) stage 4, GFR 15-29 ml/min (HCC) Reviewed lab from 06/2017. With poorly controlled BP, likely has worsening renal function. Monitor BMP.   Will need goals of care discussion with family with her medical co-morbidities.     Family/ staff Communication: reviewed care plan with patient and charge nurse.    Labs/tests ordered:  CMP in 1 week  [name], MD Internal Medicine [facility] [address] [place], [place] [address] Cell Phone (Monday-Friday 8 am - 5 pm): [phone] On Call: [phone] and follow prompts after 5 pm and on weekends Office Phone: [phone] Office Fax: [phone]

## 2017-09-04 DIAGNOSIS — C50211 Malignant neoplasm of upper-inner quadrant of right female breast: Secondary | ICD-10-CM | POA: Diagnosis not present

## 2017-09-04 DIAGNOSIS — N184 Chronic kidney disease, stage 4 (severe): Secondary | ICD-10-CM | POA: Diagnosis not present

## 2017-09-04 DIAGNOSIS — I2510 Atherosclerotic heart disease of native coronary artery without angina pectoris: Secondary | ICD-10-CM | POA: Diagnosis not present

## 2017-09-04 DIAGNOSIS — I10 Essential (primary) hypertension: Secondary | ICD-10-CM | POA: Diagnosis not present

## 2017-09-04 DIAGNOSIS — I350 Nonrheumatic aortic (valve) stenosis: Secondary | ICD-10-CM | POA: Diagnosis not present

## 2017-09-04 LAB — HEPATIC FUNCTION PANEL
ALK PHOS: 35 (ref 25–125)
ALT: 6 — AB (ref 7–35)
AST: 11 — AB (ref 13–35)
Bilirubin, Total: 0.4

## 2017-09-04 LAB — BASIC METABOLIC PANEL
BUN: 30 — AB (ref 4–21)
CREATININE: 2.3 — AB (ref 0.5–1.1)
Glucose: 88
POTASSIUM: 3.9 (ref 3.4–5.3)
SODIUM: 139 (ref 137–147)

## 2017-09-05 LAB — CMP 10231
ALBUMIN: 3.6
Calcium: 8.5
Carbon Dioxide, Total: 22
Chloride: 108
GFR CALC NON AF AMER: 19
Globulin: 2.2
TOTAL PROTEIN: 5.8

## 2017-09-11 ENCOUNTER — Non-Acute Institutional Stay (SKILLED_NURSING_FACILITY): Payer: Medicare Other | Admitting: Internal Medicine

## 2017-09-11 ENCOUNTER — Encounter: Payer: Self-pay | Admitting: Internal Medicine

## 2017-09-11 DIAGNOSIS — I10 Essential (primary) hypertension: Secondary | ICD-10-CM | POA: Diagnosis not present

## 2017-09-11 DIAGNOSIS — N184 Chronic kidney disease, stage 4 (severe): Secondary | ICD-10-CM

## 2017-09-11 NOTE — Progress Notes (Signed)
Location:  [facility] Room Number: 5 Place of Service:  SNF [phone]) Provider:  [name], MD  [name], MD  Patient Care Team: [name], MD as PCP - General (Internal Medicine) Juanita Craver, MD as Consulting Physician (Gastroenterology) Shon Hough, MD as Consulting Physician (Ophthalmology) Thornell Sartorius, MD as Consulting Physician (Otolaryngology) [place] Mast, Man X, NP as Nurse Practitioner (Nurse Practitioner) Bo Merino, MD as Consulting Physician (Rheumatology) Suella Broad, MD as Consulting Physician (Physical Medicine and Rehabilitation) Netta Cedars, MD as Consulting Physician (Orthopedic Surgery) Fanny Skates, MD as Consulting Physician (General Surgery) Truitt Merle, MD as Consulting Physician (Hematology) Thea Silversmith, MD as Consulting Physician (Radiation Oncology) Rockwell Germany, RN as Registered Nurse Mauro Kaufmann, RN as Registered Nurse Jake Shark, Johny Blamer, NP as Nurse Practitioner (Nurse Practitioner) [name], MD as Consulting Physician (Cardiology)  Extended Emergency Contact Information Primary Emergency Contact: [name],[name] Address: [address]          [place] [address] Johnnette Litter of [place] Phone: [phone] Mobile Phone: [phone] Relation: Daughter Secondary Emergency Contact: [name],[name]  [place] Mobile Phone: [phone] Relation: None  Code Status:  DNR  Goals of care: Advanced Directive information Advanced Directives 09/11/2017  Does Patient Have a Medical Advance Directive? Yes  Type of Advance Directive Out of facility DNR (pink MOST or yellow form)  Does patient want to make changes to medical advance directive? No - Patient declined  Copy of [place] in Chart? -  Pre-existing out of facility DNR order (yellow form or pink MOST form) Yellow form placed in chart (order not valid for  inpatient use);Pink MOST form placed in chart (order not valid for inpatient use)     Chief Complaint  Patient presents with  . Acute Visit    Elevated blood pressure and worsening kidney function     HPI:  Pt is a 84 y.o. female seen today for an acute visit for elevated blood pressure readings. Reviewed her recent blood pressure readings with most of SBP in 160-180 range and DBP in 90 range. 2 weeks back, her hydralazine dosing was adjusted with high BP readings. She is seen in her room today with charge nurse present. She complaints of occasional pain to right knee bothering her. Denies any other concern. Currently on imdur 90 mg daily, hydralazine 50 mg tid, coreg 12.5 mg bid, torsemide 10 mg daily and clonidine 0.1 mg bid. Has ckd stage 4   Past Medical History:  Diagnosis Date  . Arthritis    Osteoarthritis  . Cancer of left breast [facility]) August 2016  . Cerebral atherosclerosis 12/19/2012  . Cerebral atrophy 12/19/2012  . Cerebral ischemia 12/19/2012   Microvascular   . CKD (chronic kidney disease) stage 3, GFR 30-59 ml/min (HCC) 11/03/2014  . Cough   . Dementia   . Diverticulosis of colon (without mention of hemorrhage)   . Edema   . Family history of breast cancer   . Flat feet   . GERD (gastroesophageal reflux disease)   . Glaucoma   . Heart murmur   . Hyperlipidemia   . Hypertension   . Hypertonicity of bladder   . Ischemic cardiomyopathy 11/04/2014  . Lumbago   . Memory change 03/28/2016  . Osteoarthrosis, unspecified whether generalized or localized, unspecified site   . Other malaise and fatigue   . Pain in joint, pelvic region and thigh   . Pain in joint, shoulder region   .  Personal history of fall   . RLQ abdominal pain 01/17/2011  . Senile osteoporosis   . Small bowel obstruction ([place])   . STEMI (ST elevation myocardial infarction) ([place]) 11/04/2014  . Unstable gait 11/04/2014  . Urine, incontinence, stress female 09/18/2014   Past Surgical History:    Procedure Laterality Date  . BREAST LUMPECTOMY Bilateral   . BREAST LUMPECTOMY WITH RADIOACTIVE SEED LOCALIZATION Left 03/15/2015   Procedure: LEFT BREAST LUMPECTOMY WITH RADIOACTIVE SEED LOCALIZATION;  Surgeon: Fanny Skates, MD;  Location: [facility];  Service: General;  Laterality: Left;  . CARDIAC CATHETERIZATION N/A 11/08/2014   Procedure: Left Heart Cath and Coronary Angiography;  Surgeon: [name], MD;  Location: [facility] CV LAB;  Service: Cardiovascular;  Laterality: N/A;  . COLON SURGERY  02/1996   Colectomy, partial for diverticular bleed 90%  . EYE SURGERY    . JOINT REPLACEMENT Left 12/2000   Left knee; Dr. Wynelle Link  . LAPAROSCOPIC LYSIS OF ADHESIONS  11/15/10   Exploratory  . TONSILLECTOMY      Allergies  Allergen Reactions  . Sulfa Antibiotics Itching    Outpatient Encounter Medications as of 09/11/2017  Medication Sig  . acetaminophen (TYLENOL) 325 MG tablet Take 650 mg by mouth every 6 (six) hours as needed.   Marland Kitchen acetaminophen (TYLENOL) 325 MG tablet Take 650 mg by mouth 3 (three) times daily.  Marland Kitchen atorvastatin (LIPITOR) 10 MG tablet Take 10 mg by mouth daily.  . carboxymethylcellulose (REFRESH PLUS) 0.5 % SOLN Apply 2 drops to eye 2 (two) times daily.  . carvedilol (COREG) 12.5 MG tablet Take 12.5 mg by mouth 2 (two) times daily with a meal.  . cloNIDine (CATAPRES) 0.1 MG tablet Take 0.1 mg by mouth 2 (two) times daily.  . Coenzyme Q10 50 MG TABS Take one tablet by mouth at bedtime  . famotidine (PEPCID) 20 MG tablet Take 20 mg by mouth at bedtime.   . ferrous sulfate 325 (65 FE) MG tablet Take 325 mg by mouth daily with breakfast.  . folic acid (FOLVITE) 1 MG tablet Take 1 mg by mouth daily.  . hydrALAZINE (APRESOLINE) 50 MG tablet Take 50 mg by mouth 3 (three) times daily.  . isosorbide mononitrate (IMDUR) 60 MG 24 hr tablet Take 90 mg by mouth at bedtime.   Marland Kitchen KLOR-CON 10 10 MEQ tablet Take 10 mEq by mouth daily.  Marland Kitchen latanoprost (XALATAN) 0.005 % ophthalmic solution  Place 1 drop into both eyes at bedtime.   . timolol (TIMOPTIC-XR) 0.5 % ophthalmic gel-forming Place 1 drop into both eyes daily.   Marland Kitchen torsemide (DEMADEX) 10 MG tablet Take 10 mg daily by mouth. Hold for SBP < 110  . vitamin B-12 (CYANOCOBALAMIN) 1000 MCG tablet Take 1,000 mcg See admin instructions by mouth. Take 1000 mcg by mouth daily On Mondays,Wednesdays and Fridays  . [DISCONTINUED] hydrALAZINE (APRESOLINE) 25 MG tablet Take 25 mg by mouth 3 (three) times daily.    No facility-administered encounter medications on file as of 09/11/2017.     Review of Systems  Reason unable to perform ROS: limited due to dementia.  Constitutional: Negative for appetite change and fever.  Eyes:       Denies worsening vision or blurry vision  Respiratory: Negative for shortness of breath.   Cardiovascular: Negative for chest pain and palpitations.  Gastrointestinal: Negative for abdominal pain, nausea and vomiting.  Musculoskeletal: Positive for arthralgias and gait problem.  Neurological: Negative for dizziness and headaches.       Denies  new weakness to her extremities  Psychiatric/Behavioral: Positive for confusion.    Immunization History  Administered Date(s) Administered  . Influenza Whole 11/19/2011, 12/02/2012  . Influenza-Unspecified 12/06/2013, 12/06/2015, 12/02/2016  . PPD Test 11/10/2014  . Pneumococcal Conjugate-13 03/09/2009  . Pneumococcal Polysaccharide-23 02/18/2009  . Tdap 04/02/2016   Pertinent  Health Maintenance Due  Topic Date Due  . INFLUENZA VACCINE  09/18/2017  . MAMMOGRAM  11/26/2017  . DEXA SCAN  Completed  . PNA vac Low Risk Adult  Completed   Fall Risk  02/05/2017 01/31/2017 07/13/2015 11/17/2014 04/21/2014  Falls in the past year? No No No Yes Yes  Number falls in past yr: - - - 1 1  Injury with Fall? - - - No No  Comment - - - - -  Risk for fall due to : - History of fall(s);Impaired balance/gait;Impaired vision - Impaired balance/gait;History of fall(s) -    Functional Status Survey:    Vitals:   09/11/17 1112  BP: (!) 160/90  Pulse: 62  Resp: 20  Temp: 97.8 F (36.6 C)  TempSrc: Oral  SpO2: 97%  Weight: 153 lb 14.4 oz (69.8 kg)  Height: 5\' 5"  (1.651 m)   Body mass index is 25.61 kg/m.   Wt Readings from Last 3 Encounters:  09/11/17 153 lb 14.4 oz (69.8 kg)  08/28/17 152 lb 6.4 oz (69.1 kg)  08/19/17 155 lb 8 oz (70.5 kg)   Physical Exam  Constitutional:  Elderly female in NAD   HENT:  Head: Normocephalic and atraumatic.  Mouth/Throat: Oropharynx is clear and moist.  Eyes: Conjunctivae are normal.  Neck: Normal range of motion. Neck supple.  Cardiovascular: Normal rate and regular rhythm.  Murmur heard. Pulmonary/Chest: Effort normal and breath sounds normal. No respiratory distress. She has no wheezes. She has no rales.  Abdominal: Soft. Bowel sounds are normal. There is no tenderness. There is no guarding.  Musculoskeletal: She exhibits edema.  trace leg edema, able to move all 4 extremities, knee arthritis, unsteady gait, needs assistance with transfer, no joint swelling or tenderness on exam  Neurological: She is alert.  Oriented to self and place  Skin: Skin is warm and dry.  Psychiatric:  PLEASANTLY CONFUSED    Labs reviewed: Recent Labs    03/31/17 0836  06/30/17 0820 07/10/17 09/04/17  NA 139   < > 140 140 139  K 4.0   < > 3.7 3.5 3.9  CL 106  --  112* 109 108  CO2 23  --  19* 8.9 22  GLUCOSE 97  --  141*  --   --   BUN 25   < > 26 24* 30*  CREATININE 2.22*   < > 2.19* 2.1* 2.3*  CALCIUM 9.3  --  8.8 8.9 8.5   < > = values in this interval not displayed.   Recent Labs    03/31/17 0836  06/30/17 0820 07/10/17 09/04/17  AST 15   < > 12 13 11*  ALT <6   < > 9 5* 6*  ALKPHOS 53   < > 46 40 35  BILITOT 0.5  --  0.4  --   --   PROT 7.1  --  6.9  --  5.8  ALBUMIN 3.8  --  3.9 4.2 3.6   < > = values in this interval not displayed.   Recent Labs    03/31/17 0836  05/27/17 06/30/17 0820  07/01/17  WBC 4.9   < > 7.1 6.0 6.0  NEUTROABS 3.3  --   --  4.1  --   HGB 10.9*   < > 9.3* 10.6* 10.8*  HCT 33.7*   < > 29* 34.0* 33*  MCV 90.3  --   --  91.2  --   PLT 190   < > 228 208 225   < > = values in this interval not displayed.   Lab Results  Component Value Date   TSH 2.21 04/04/2016   Lab Results  Component Value Date   HGBA1C 5.6 11/06/2014   Lab Results  Component Value Date   CHOL 110 04/30/2017   HDL 41 04/30/2017   LDLCALC 50 04/30/2017   TRIG 111 04/30/2017   CHOLHDL 3.1 11/06/2014    Significant Diagnostic Results in last 30 days:  No results found.  Assessment/Plan  1. Hypertension, uncontrolled Change hydralazine to 75 mg tid. Continue imdur, coreg, torsemide and clonidine current dosing. k stable. monitor  2. CKD (chronic kidney disease) stage 4, GFR 15-29 ml/min (HCC) Slightly worsened kidney function from before. Urine output good per nursing. Maintain hydration. Change famotidine to 10 mg daily with her renal impairment.    Family/ staff Communication: reviewed care plan with patient and charge nurse.    Labs/tests ordered:  none  [name], MD Internal Medicine [facility] [address] [place], [place] [address] Cell Phone (Monday-Friday 8 am - 5 pm): [phone] On Call: [phone] and follow prompts after 5 pm and on weekends Office Phone: [phone] Office Fax: [phone]

## 2017-09-19 ENCOUNTER — Encounter: Payer: Self-pay | Admitting: Nurse Practitioner

## 2017-09-19 ENCOUNTER — Non-Acute Institutional Stay (SKILLED_NURSING_FACILITY): Payer: Medicare Other | Admitting: Nurse Practitioner

## 2017-09-19 DIAGNOSIS — N184 Chronic kidney disease, stage 4 (severe): Secondary | ICD-10-CM

## 2017-09-19 DIAGNOSIS — M25561 Pain in right knee: Secondary | ICD-10-CM

## 2017-09-19 DIAGNOSIS — I5032 Chronic diastolic (congestive) heart failure: Secondary | ICD-10-CM

## 2017-09-19 DIAGNOSIS — D509 Iron deficiency anemia, unspecified: Secondary | ICD-10-CM | POA: Diagnosis not present

## 2017-09-19 DIAGNOSIS — F0150 Vascular dementia without behavioral disturbance: Secondary | ICD-10-CM

## 2017-09-19 DIAGNOSIS — I10 Essential (primary) hypertension: Secondary | ICD-10-CM

## 2017-09-19 NOTE — Assessment & Plan Note (Signed)
Knee pain is managed, continue Tylenol 650mg  tid.

## 2017-09-19 NOTE — Assessment & Plan Note (Signed)
CKD, creat 2s.

## 2017-09-19 NOTE — Progress Notes (Signed)
Location:  [place] Room Number: 5 Place of Service:  SNF (31) Provider: Eri Platten, ManXie  NP  [name], MD  Patient Care Team: [name], MD as PCP - General (Internal Medicine) Juanita Craver, MD as Consulting Physician (Gastroenterology) Shon Hough, MD as Consulting Physician (Ophthalmology) Thornell Sartorius, MD as Consulting Physician (Otolaryngology) [facility] Tiajah Oyster X, NP as Nurse Practitioner (Nurse Practitioner) Bo Merino, MD as Consulting Physician (Rheumatology) Suella Broad, MD as Consulting Physician (Physical Medicine and Rehabilitation) Netta Cedars, MD as Consulting Physician (Orthopedic Surgery) Fanny Skates, MD as Consulting Physician (General Surgery) Truitt Merle, MD as Consulting Physician (Hematology) Thea Silversmith, MD as Consulting Physician (Radiation Oncology) Rockwell Germany, RN as Registered Nurse Mauro Kaufmann, RN as Registered Nurse Jake Shark, Johny Blamer, NP as Nurse Practitioner (Nurse Practitioner) [name], MD as Consulting Physician (Cardiology)  Extended Emergency Contact Information Primary Emergency Contact: [name],[name] Address: [address]          [place] [address] Johnnette Litter of [place] Phone: [phone] Mobile Phone: [phone] Relation: Daughter Secondary Emergency Contact: [name],[name]  [place] Mobile Phone: [phone] Relation: None  Code Status:  DNR Goals of care: Advanced Directive information Advanced Directives 09/19/2017  Does Patient Have a Medical Advance Directive? Yes  Type of Advance Directive Out of facility DNR (pink MOST or yellow form)  Does patient want to make changes to medical advance directive? No - Patient declined  Copy of [place] in Chart? -  Pre-existing out of facility DNR order (yellow form or pink MOST form) Yellow form placed in chart (order not valid for  inpatient use);Pink MOST form placed in chart (order not valid for inpatient use)     Chief Complaint  Patient presents with  . Medical Management of Chronic Issues    F/u- HTN, CKD, CHF    HPI:  Pt is a 84 y.o. female seen today for medical management of chronic diseases.     The patient has history of dementia, resides in SNF [facility] for care assistance, not taking memory preserving meds. Anemia, stable, on Vit O27, Fe, Folic acid, last Hgb 03.5 06/30/17. CKD, creat 2s. CHF compensated clinically, on Torsemide 10mg  qd. HTN blood pressure is controlled with SBP in 160s, on Isosorbide 90mg  qd, Hydralazine 75mg  tid, Clonidine 0.1mg  bid, Carvedilol 12.5mg  bid. Knee pain is managed with Tylenol 650mg  tid.  Past Medical History:  Diagnosis Date  . Arthritis    Osteoarthritis  . Cancer of left breast [facility]) August 2016  . Cerebral atherosclerosis 12/19/2012  . Cerebral atrophy 12/19/2012  . Cerebral ischemia 12/19/2012   Microvascular   . CKD (chronic kidney disease) stage 3, GFR 30-59 ml/min (HCC) 11/03/2014  . Cough   . Dementia   . Diverticulosis of colon (without mention of hemorrhage)   . Edema   . Family history of breast cancer   . Flat feet   . GERD (gastroesophageal reflux disease)   . Glaucoma   . Heart murmur   . Hyperlipidemia   . Hypertension   . Hypertonicity of bladder   . Ischemic cardiomyopathy 11/04/2014  . Lumbago   . Memory change 03/28/2016  . Osteoarthrosis, unspecified whether generalized or localized, unspecified site   . Other malaise and fatigue   . Pain in joint, pelvic region and thigh   . Pain in joint, shoulder region   . Personal history of fall   . RLQ abdominal pain 01/17/2011  .  Senile osteoporosis   . Small bowel obstruction ([place])   . STEMI (ST elevation myocardial infarction) ([place]) 11/04/2014  . Unstable gait 11/04/2014  . Urine, incontinence, stress female 09/18/2014   Past Surgical History:  Procedure Laterality Date  . BREAST LUMPECTOMY  Bilateral   . BREAST LUMPECTOMY WITH RADIOACTIVE SEED LOCALIZATION Left 03/15/2015   Procedure: LEFT BREAST LUMPECTOMY WITH RADIOACTIVE SEED LOCALIZATION;  Surgeon: Fanny Skates, MD;  Location: [facility];  Service: General;  Laterality: Left;  . CARDIAC CATHETERIZATION N/A 11/08/2014   Procedure: Left Heart Cath and Coronary Angiography;  Surgeon: [name], MD;  Location: [place] CV LAB;  Service: Cardiovascular;  Laterality: N/A;  . COLON SURGERY  02/1996   Colectomy, partial for diverticular bleed 90%  . EYE SURGERY    . JOINT REPLACEMENT Left 12/2000   Left knee; Dr. Wynelle Link  . LAPAROSCOPIC LYSIS OF ADHESIONS  11/15/10   Exploratory  . TONSILLECTOMY      Allergies  Allergen Reactions  . Sulfa Antibiotics Itching    Outpatient Encounter Medications as of 09/19/2017  Medication Sig  . acetaminophen (TYLENOL) 325 MG tablet Take 650 mg by mouth every 6 (six) hours as needed.   Marland Kitchen acetaminophen (TYLENOL) 325 MG tablet Take 650 mg by mouth 3 (three) times daily.  Marland Kitchen atorvastatin (LIPITOR) 10 MG tablet Take 10 mg by mouth daily.  . carboxymethylcellulose (REFRESH PLUS) 0.5 % SOLN Apply 2 drops to eye 2 (two) times daily.  . carvedilol (COREG) 12.5 MG tablet Take 12.5 mg by mouth 2 (two) times daily with a meal.  . cloNIDine (CATAPRES) 0.1 MG tablet Take 0.1 mg by mouth 2 (two) times daily.  . Coenzyme Q10 50 MG TABS Take one tablet by mouth at bedtime  . famotidine (PEPCID) 20 MG tablet Take 20 mg by mouth at bedtime.   . ferrous sulfate 325 (65 FE) MG tablet Take 325 mg by mouth daily with breakfast.  . folic acid (FOLVITE) 1 MG tablet Take 1 mg by mouth daily.  . hydrALAZINE (APRESOLINE) 50 MG tablet Take 75 mg by mouth 3 (three) times daily.   Marland Kitchen KLOR-CON 10 10 MEQ tablet Take 10 mEq by mouth daily.  Marland Kitchen latanoprost (XALATAN) 0.005 % ophthalmic solution Place 1 drop into both eyes at bedtime.   . timolol (TIMOPTIC-XR) 0.5 % ophthalmic gel-forming Place 1 drop into both eyes daily.   Marland Kitchen  torsemide (DEMADEX) 10 MG tablet Take 10 mg daily by mouth. Hold for SBP < 110  . vitamin B-12 (CYANOCOBALAMIN) 1000 MCG tablet Take 1,000 mcg See admin instructions by mouth. Take 1000 mcg by mouth daily On Mondays,Wednesdays and Fridays  . [DISCONTINUED] isosorbide mononitrate (IMDUR) 60 MG 24 hr tablet Take 90 mg by mouth at bedtime.    No facility-administered encounter medications on file as of 09/19/2017.    ROS was provided with assistance of staff Review of Systems  Constitutional: Negative for activity change, appetite change, chills, diaphoresis and fever.  HENT: Positive for hearing loss. Negative for congestion and voice change.   Eyes: Negative for visual disturbance.  Respiratory: Negative for cough, shortness of breath and wheezing.   Cardiovascular: Positive for leg swelling. Negative for chest pain and palpitations.  Gastrointestinal: Negative for abdominal distention, abdominal pain, constipation, diarrhea and vomiting.  Genitourinary: Negative for difficulty urinating, dysuria and urgency.  Musculoskeletal: Positive for arthralgias and gait problem.  Skin: Negative for color change and pallor.  Neurological: Negative for dizziness, speech difficulty, weakness and headaches.  Dementia  Psychiatric/Behavioral: Positive for confusion. Negative for agitation, behavioral problems and sleep disturbance. The patient is not nervous/anxious.     Immunization History  Administered Date(s) Administered  . Influenza Whole 11/19/2011, 12/02/2012  . Influenza-Unspecified 12/06/2013, 12/06/2015, 12/02/2016  . PPD Test 11/10/2014  . Pneumococcal Conjugate-13 03/09/2009  . Pneumococcal Polysaccharide-23 02/18/2009  . Tdap 04/02/2016   Pertinent  Health Maintenance Due  Topic Date Due  . INFLUENZA VACCINE  09/18/2017  . MAMMOGRAM  11/26/2017  . DEXA SCAN  Completed  . PNA vac Low Risk Adult  Completed   Fall Risk  02/05/2017 01/31/2017 07/13/2015 11/17/2014 04/21/2014  Falls in  the past year? No No No Yes Yes  Number falls in past yr: - - - 1 1  Injury with Fall? - - - No No  Comment - - - - -  Risk for fall due to : - History of fall(s);Impaired balance/gait;Impaired vision - Impaired balance/gait;History of fall(s) -   Functional Status Survey:    Vitals:   09/19/17 1148  BP: (!) 160/80  Pulse: 77  Resp: 19  Temp: (!) 96.5 F (35.8 C)  SpO2: 97%  Weight: 153 lb (69.4 kg)  Height: 5\' 5"  (1.651 m)   Body mass index is 25.46 kg/m. Physical Exam  Constitutional: She appears well-developed and well-nourished.  overweight  HENT:  Head: Normocephalic.  Eyes: Pupils are equal, round, and reactive to light. EOM are normal.  Neck: Normal range of motion. Neck supple. No JVD present. No thyromegaly present.  Cardiovascular: Normal rate and regular rhythm.  Murmur heard. Pulmonary/Chest: Effort normal. She has no wheezes. She has no rales.  Abdominal: Soft. Bowel sounds are normal. She exhibits no distension. There is no tenderness. There is no rebound and no guarding.  Musculoskeletal: She exhibits edema and tenderness.  Trace edema BLE, chronic right knee pain, one person assist or SBA for transfer, w/c for mobility.   Neurological: She is alert. No cranial nerve deficit. She exhibits normal muscle tone. Coordination normal.  Oriented to person and place.   Skin: Skin is warm and dry.  Psychiatric: She has a normal mood and affect. Her behavior is normal.    Labs reviewed: Recent Labs    03/31/17 0836  06/30/17 0820 07/10/17 09/04/17  NA 139   < > 140 140 139  K 4.0   < > 3.7 3.5 3.9  CL 106  --  112* 109 108  CO2 23  --  19* 8.9 22  GLUCOSE 97  --  141*  --   --   BUN 25   < > 26 24* 30*  CREATININE 2.22*   < > 2.19* 2.1* 2.3*  CALCIUM 9.3  --  8.8 8.9 8.5   < > = values in this interval not displayed.   Recent Labs    03/31/17 0836  06/30/17 0820 07/10/17 09/04/17  AST 15   < > 12 13 11*  ALT <6   < > 9 5* 6*  ALKPHOS 53   < > 46 40  35  BILITOT 0.5  --  0.4  --   --   PROT 7.1  --  6.9  --  5.8  ALBUMIN 3.8  --  3.9 4.2 3.6   < > = values in this interval not displayed.   Recent Labs    03/31/17 0836  05/27/17 06/30/17 0820 07/01/17  WBC 4.9   < > 7.1 6.0 6.0  NEUTROABS 3.3  --   --  4.1  --   HGB 10.9*   < > 9.3* 10.6* 10.8*  HCT 33.7*   < > 29* 34.0* 33*  MCV 90.3  --   --  91.2  --   PLT 190   < > 228 208 225   < > = values in this interval not displayed.   Lab Results  Component Value Date   TSH 2.21 04/04/2016   Lab Results  Component Value Date   HGBA1C 5.6 11/06/2014   Lab Results  Component Value Date   CHOL 110 04/30/2017   HDL 41 04/30/2017   LDLCALC 50 04/30/2017   TRIG 111 04/30/2017   CHOLHDL 3.1 11/06/2014    Significant Diagnostic Results in last 30 days:  No results found.  Assessment/Plan Vascular dementia  dementia, resides in SNF [facility] for care assistance, not taking memory preserving meds.  Anemia  Anemia, stable, on Vit S85, Fe, Folic acid, last Hgb 46.2 06/30/17. Update CBC  CKD (chronic kidney disease) stage 4, GFR 15-29 ml/min (HCC) CKD, creat 2s.   Chronic diastolic congestive heart failure (HCC) CHF compensated clinically, continue  Torsemide 10mg  qd.   Accelerated hypertension HTN blood pressure is controlled with SBP in 160s, continue Isosorbide 90mg  qd, Hydralazine 75mg  tid, Clonidine 0.1mg  bid, Carvedilol 12.5mg  bid.   Right knee pain Knee pain is managed, continue Tylenol 650mg  tid.       Family/ staff Communication: plan of care reviewed with the patient and charge nurse.   Labs/tests ordered:  CBC  Time spend 25 minutes.

## 2017-09-19 NOTE — Assessment & Plan Note (Signed)
Anemia, stable, on Vit J73, Fe, Folic acid, last Hgb 66.8 06/30/17. Update CBC

## 2017-09-19 NOTE — Assessment & Plan Note (Signed)
HTN blood pressure is controlled with SBP in 160s, continue Isosorbide 90mg  qd, Hydralazine 75mg  tid, Clonidine 0.1mg  bid, Carvedilol 12.5mg  bid.

## 2017-09-19 NOTE — Assessment & Plan Note (Signed)
dementia, resides in SNF [facility] for care assistance, not taking memory preserving meds.

## 2017-09-19 NOTE — Assessment & Plan Note (Signed)
CHF compensated clinically, continue  Torsemide 10mg  qd.

## 2017-09-22 ENCOUNTER — Encounter: Payer: Self-pay | Admitting: Nurse Practitioner

## 2017-09-23 DIAGNOSIS — D649 Anemia, unspecified: Secondary | ICD-10-CM | POA: Diagnosis not present

## 2017-09-23 LAB — CBC AND DIFFERENTIAL
HCT: 29 — AB (ref 36–46)
Hemoglobin: 9.3 — AB (ref 12.0–16.0)
Platelets: 207 (ref 150–399)
WBC: 5.4

## 2017-09-24 ENCOUNTER — Other Ambulatory Visit: Payer: Self-pay | Admitting: *Deleted

## 2017-09-26 NOTE — Progress Notes (Signed)
[place]  [phone] Fax:(336) [phone]  Clinic Follow Up Note   Patient Care Team: [name], MD as PCP - General (Internal Medicine) Juanita Craver, MD as Consulting Physician (Gastroenterology) Shon Hough, MD as Consulting Physician (Ophthalmology) Thornell Sartorius, MD as Consulting Physician (Otolaryngology) [place], Man X, NP as Nurse Practitioner (Nurse Practitioner) Bo Merino, MD as Consulting Physician (Rheumatology) Suella Broad, MD as Consulting Physician (Physical Medicine and Rehabilitation) Netta Cedars, MD as Consulting Physician (Orthopedic Surgery) Fanny Skates, MD as Consulting Physician (General Surgery) Truitt Merle, MD as Consulting Physician (Hematology) Thea Silversmith, MD as Consulting Physician (Radiation Oncology) Rockwell Germany, RN as Registered Nurse Mauro Kaufmann, RN as Registered Nurse Jake Shark, Johny Blamer, NP as Nurse Practitioner (Nurse Practitioner) [name], MD as Consulting Physician (Cardiology)    Date of Service:  09/29/2017   CHIEF COMPLAINTS:  Follow up right breast cancer, triple negative   Oncology History   Breast cancer of upper-outer quadrant of left female breast   Staging form: Breast, AJCC 7th Edition     Clinical: Stage 0 (Tis (DCIS), N0, M0) - Unsigned       Breast cancer of upper-outer quadrant of left female breast ([place])   09/23/2014 Mammogram    Left breast: new cluster of calcifications in the left breast is indeterminate. Spot magnification views are recommended    10/13/2014 Initial Biopsy    Left breast biopsy: DCIS, high-grade, with calcification and necrosis. ER- (0%), PR- (0%)    10/13/2014 Clinical Stage    Stage 0: Tis N0    03/15/2015 Definitive Surgery    Left lumpectomy: DCIS, high grade, with comedonecrosis and calcifications, spanning 1.5 cm, ALH, ER- (0%), PR- (0%).    03/15/2015 Pathologic Stage    Stage 0: Tis  N0  Diagnosis 03/14/16 Breast, lumpectomy, Left - HIGH GRADE DUCTAL CARCINOMA IN SITU WITH COMEDONECROSIS AND CALCIFICATIONS, SPANNING APPROXIMATELY 1.5 CM IN GREATEST DIMENSION. - ATYPICAL LOBULAR HYPERPLASIA. - DUCTAL CARCINOMA IN SITU IS LESS THAN 0.2 CM IN SEVERAL AREAS TO THE ANTERIOR MARGIN. 1 of 3    03/29/2015 Survivorship    Survivorship care plan completed and mailed to patient in lieu of in person visit     Cancer of central portion of right female breast ([place])   11/26/2016 Mammogram    Diagnostic Mammogram and [place] 11/26/16  IMPRESSION:  The is a 4.2 cm lobulated mass in the right breast central to the nipple anterior depth is highly suggestive of malignancy. The lymph node in the right axilla is at a moderate concern but not classic for malignancy. An ultrasound guided biopsy is recommended.       11/26/2016 Initial Biopsy    Diagnosis 11/26/16 1. Breast, right, needle core biopsy, 12:00 o'clock - INVASIVE DUCTAL CARCINOMA - SEE COMMENT 2. Lymph node, needle/core biopsy, right axilla - METASTATIC CARCINOMA INVOLVING ONE LYMPH NODE     11/26/2016 Receptors her2    Estrogen Receptor: 0%, NEGATIVE Progesterone Receptor: 0%, NEGATIVE Proliferation Marker Ki67: 20% HER2 - NEGATIVE    11/29/2016 Initial Diagnosis    Infiltrating ductal carcinoma of right breast ([place])    01/10/2017 PET scan    PET 01/10/17  IMPRESSION: 1. Right subareolar mass measures 4.7 cm in long axis with maximum SUV 23.6 compatible with malignancy. There are 2 hypermetabolic mildly enlarged right axillary lymph nodes but no other metastatic lesions are identified. 2. Other imaging findings of potential clinical significance: Aortic Atherosclerosis (ICD10-I70.0). Coronary atherosclerosis. Mild cardiomegaly.  Trace left pleural effusion. Intracranial chronic ischemic microvascular white matter disease.    02/14/2017 - 03/14/2017 Radiation Therapy    Radiation therapy with Dr. Isidore Moos     HISTORY  OF PRESENTING ILLNESS: 03/14/16 [name] 84 y.o. female with multiple medical comorbidities, as listed below, is here because of recently diagnosed left breast DCIS. She presents to our multidisciplinary breast clinic today with her daughter.  This was discovered by screening mammogram. She did not have palpable breast mass, she denies any new symptoms lately. She had a stroke earlier this year, which resulted mild left-sided weakness. She is a resident in an assisted living, she is able to walk with a walker, and do some limited self care, but she is not active, spends most time on sitting during the day. Her daughter states she skips meals sometime, and does not go out often.   CURRENT THERAPY: Supportive Care  INTERVAL HISTORY:  [name] is here for a follow up of her triple negative right breast cancer. She presents to the clinic today accompanied by her caregiver from [facility].  She notes she is doing fine overall. Her caregiver reminded her about pain in her right knee. Prior to this pain she stopped walking and continues to get around with wheelchair. She notes her appetite is fine if she gets what she likes to eat. She notes mild abdominal pain. She notes pain in her bright breast due to mass.      MEDICAL HISTORY:  Past Medical History:  Diagnosis Date  . Arthritis    Osteoarthritis  . Cancer of left breast [facility]) August 2016  . Cerebral atherosclerosis 12/19/2012  . Cerebral atrophy 12/19/2012  . Cerebral ischemia 12/19/2012   Microvascular   . CKD (chronic kidney disease) stage 3, GFR 30-59 ml/min (HCC) 11/03/2014  . Cough   . Dementia   . Diverticulosis of colon (without mention of hemorrhage)   . Edema   . Family history of breast cancer   . Flat feet   . GERD (gastroesophageal reflux disease)   . Glaucoma   . Heart murmur   . Hyperlipidemia   . Hypertension   . Hypertonicity of bladder   . Ischemic cardiomyopathy 11/04/2014  . Lumbago   . Memory change  03/28/2016  . Osteoarthrosis, unspecified whether generalized or localized, unspecified site   . Other malaise and fatigue   . Pain in joint, pelvic region and thigh   . Pain in joint, shoulder region   . Personal history of fall   . RLQ abdominal pain 01/17/2011  . Senile osteoporosis   . Small bowel obstruction ([place])   . STEMI (ST elevation myocardial infarction) ([employer]) 11/04/2014  . Unstable gait 11/04/2014  . Urine, incontinence, stress female 09/18/2014    SURGICAL HISTORY: Past Surgical History:  Procedure Laterality Date  . BREAST LUMPECTOMY Bilateral   . BREAST LUMPECTOMY WITH RADIOACTIVE SEED LOCALIZATION Left 03/15/2015   Procedure: LEFT BREAST LUMPECTOMY WITH RADIOACTIVE SEED LOCALIZATION;  Surgeon: Fanny Skates, MD;  Location: [place];  Service: General;  Laterality: Left;  . CARDIAC CATHETERIZATION N/A 11/08/2014   Procedure: Left Heart Cath and Coronary Angiography;  Surgeon: [name], MD;  Location: [facility] [facility];  Service: Cardiovascular;  Laterality: N/A;  . COLON SURGERY  02/1996   Colectomy, partial for diverticular bleed 90%  . EYE SURGERY    . JOINT REPLACEMENT Left 12/2000   Left knee; Dr. Wynelle Link  . LAPAROSCOPIC LYSIS OF ADHESIONS  11/15/10  Exploratory  . TONSILLECTOMY     GYN HISTORY  Menarchal: 14 LMP: 52 Contraceptive: HRT: no  G0P0: adopted daughter   SOCIAL HISTORY: Social History   Socioeconomic History  . Marital status: Widowed    Spouse name: Not on file  . Number of children: Not on file  . Years of education: Not on file  . Highest education level: Not on file  Occupational History  . Not on file  Social Needs  . Financial resource strain: Patient refused  . Food insecurity:    Worry: Patient refused    Inability: Patient refused  . Transportation needs:    Medical: Patient refused    Non-medical: Patient refused  Tobacco Use  . Smoking status: Former Smoker    Packs/day: 0.50    Years: 30.00    Pack years: 15.00     Types: Cigarettes    Last attempt to quit: 12/16/1980    Years since quitting: 36.8  . Smokeless tobacco: Never Used  Substance and Sexual Activity  . Alcohol use: Yes    Alcohol/week: 1.0 standard drinks    Types: 1 Glasses of wine per week    Comment: occasional glass of wine  . Drug use: No  . Sexual activity: Never  Lifestyle  . Physical activity:    Days per week: Patient refused    Minutes per session: Patient refused  . Stress: Patient refused  Relationships  . Social connections:    Talks on phone: Patient refused    Gets together: Patient refused    Attends religious service: Patient refused    Active member of club or organization: Patient refused    Attends meetings of clubs or organizations: Patient refused    Relationship status: Widowed  . Intimate partner violence:    Fear of current or ex partner: Patient refused    Emotionally abused: Patient refused    Physically abused: Patient refused    Forced sexual activity: Patient refused  Other Topics Concern  . Not on file  Social History Narrative   Lives at [facility] 05/2011   Widowed 2003   Living Will   [place] with cane   Never smoked    Exercise none     FAMILY HISTORY: Family History  Problem Relation Age of Onset  . Cancer Father 45       colon  . Cancer Maternal Aunt        breast cancer   . Cancer Cousin        breast cancer     ALLERGIES:  is allergic to sulfa antibiotics.  MEDICATIONS:  Current Outpatient Medications  Medication Sig Dispense Refill  . acetaminophen (TYLENOL) 325 MG tablet Take 650 mg by mouth every 6 (six) hours as needed.     Marland Kitchen acetaminophen (TYLENOL) 325 MG tablet Take 650 mg by mouth 3 (three) times daily.    Marland Kitchen atorvastatin (LIPITOR) 10 MG tablet Take 10 mg by mouth daily.    . carboxymethylcellulose (REFRESH PLUS) 0.5 % SOLN Apply 2 drops to eye 2 (two) times daily.    . carvedilol (COREG) 12.5 MG tablet Take 12.5 mg by mouth 2 (two) times daily with a  meal.    . cloNIDine (CATAPRES) 0.1 MG tablet Take 0.1 mg by mouth 2 (two) times daily.    . Coenzyme Q10 50 MG TABS Take one tablet by mouth at bedtime    . famotidine (PEPCID) 20 MG tablet Take 20 mg by mouth at bedtime.     Marland Kitchen  ferrous sulfate 325 (65 FE) MG tablet Take 325 mg by mouth daily with breakfast.    . folic acid (FOLVITE) 1 MG tablet Take 1 mg by mouth daily.    . hydrALAZINE (APRESOLINE) 50 MG tablet Take 75 mg by mouth 3 (three) times daily.     Marland Kitchen KLOR-CON 10 10 MEQ tablet Take 10 mEq by mouth daily.    Marland Kitchen latanoprost (XALATAN) 0.005 % ophthalmic solution Place 1 drop into both eyes at bedtime.     . timolol (TIMOPTIC-XR) 0.5 % ophthalmic gel-forming Place 1 drop into both eyes daily.     Marland Kitchen torsemide (DEMADEX) 10 MG tablet Take 10 mg daily by mouth. Hold for SBP < 110    . vitamin B-12 (CYANOCOBALAMIN) 1000 MCG tablet Take 1,000 mcg See admin instructions by mouth. Take 1000 mcg by mouth daily On Mondays,Wednesdays and Fridays     No current facility-administered medications for this visit.     REVIEW OF SYSTEMS:   Constitutional: Denies fevers, chills or abnormal night sweats (+) Moderate appetite  Eyes: Denies blurriness of vision, double vision or watery eyes Ears, nose, mouth, throat, and face: Denies mucositis or sore throat (+) Hard of Hearing  Respiratory: Denies cough, dyspnea or wheezes Cardiovascular: Denies palpitation, chest discomfort or lower extremity swelling Gastrointestinal:  Denies nausea, heartburn or change in bowel habits (+) mild abdominal discomfort Skin: Denies abnormal skin rashes Lymphatics: Denies new lymphadenopathy or easy bruising Neurological:Denies numbness, tingling or new weaknesses MSK:  (+) wheelchair bound (+) Right knee pain  Breast: (+) palpable mass in right breast, improved, with tenderness Behavioral/Psych: Mood is stable, no new changes  All other systems were reviewed with the patient and are negative.   PHYSICAL  EXAMINATION: ECOG PERFORMANCE STATUS: 3 - Symptomatic, >50% confined to bed  Vitals:   09/29/17 0920 09/29/17 0921  BP: (!) 201/107 (!) 215/113  Pulse: 77   Resp: 17   Temp: 98 F (36.7 C)   SpO2: 94%    Filed Weights    GENERAL:alert, no distress and comfortable  (+) she is wheelchair bound  SKIN: skin color, texture, turgor are normal, no rashes or significant lesions EYES: normal, conjunctiva are pink and non-injected, sclera clear OROPHARYNX:no exudate, no erythema and lips, buccal mucosa, and tongue normal  NECK: supple, thyroid normal size, non-tender, without nodularity LYMPH:  no palpable lymphadenopathy in the cervical, axillary or inguinal LUNGS: clear to auscultation and percussion with normal breathing effort HEART: regular rate & rhythm and no murmurs and no lower extremity edema ABDOMEN:abdomen soft, non-tender and normal bowel sounds Musculoskeletal:no cyanosis of digits and no clubbing  PSYCH: alert & oriented x 3 with fluent speech NEURO: no focal motor/sensory deficits Breasts: Breast: Exam done in wheelchair: She has a now 2.5cm mass (previously 3x3.5cm) in the central portion of right breast 1-2:00 position 2cm from the nipple with mild skin erythema, no open ulcers. She has a 2.5cm palpable lymph node in right lower axilla, flatter than before. Left breast and axilla has no palpable mass or adenopathy.   LABORATORY DATA:  I have reviewed the data as listed CBC Latest Ref Rng & Units 09/29/2017 09/23/2017 07/01/2017  WBC 3.9 - 10.3 K/uL 7.7 5.4 6.0  Hemoglobin 11.6 - 15.9 g/dL 11.6 9.3(A) 10.8(A)  Hematocrit 34.8 - 46.6 % 37.1 29(A) 33(A)  Platelets 145 - 400 K/uL 214 207 225   CMP Latest Ref Rng & Units 09/29/2017 09/04/2017 07/10/2017  Glucose 70 - 99 mg/dL 105(H) - -  BUN 8 - 23 mg/dL 28(H) 30(A) 24(A)  Creatinine 0.44 - 1.00 mg/dL 2.14(H) 2.3(A) 2.1(A)  Sodium 135 - 145 mmol/L 141 139 140  Potassium 3.5 - 5.1 mmol/L 3.9 3.9 3.5  Chloride 98 - 111 mmol/L  109 108 109  CO2 22 - 32 mmol/L 21(L) 22 8.9  Calcium 8.9 - 10.3 mg/dL 9.3 8.5 8.9  Total Protein 6.5 - 8.1 g/dL 7.5 5.8 -  Total Bilirubin 0.3 - 1.2 mg/dL 0.8 - -  Alkaline Phos 38 - 126 U/L 52 35 40  AST 15 - 41 U/L 16 11(A) 13  ALT 0 - 44 U/L 9 6(A) 5(A)     PATHOLOGY REPORT:  Diagnosis 11/26/16 1. Breast, right, needle core biopsy, 12:00 o'clock - INVASIVE DUCTAL CARCINOMA - SEE COMMENT 2. Lymph node, needle/core biopsy, right axilla - METASTATIC CARCINOMA INVOLVING ONE LYMPH NODE Microscopic Comment 1. Based on the biopsy, the carcinoma appears Nottingham grade 2 of 3 and measures 0.9 cm in greatest linear extent. Dr. Saralyn Pilar reviewed the case. Prognostic markers (ER/PR/ki-67/HER2-FISH) are pending and will be reported in an addendum. This case was called to Dr. Marcelo Baldy on November 27, 2016. 2. FLUORESCENCE IN-SITU HYBRIDIZATION Results: HER2 - NEGATIVE RATIO OF HER2/CEP17 SIGNALS 1.38 AVERAGE HER2 COPY NUMBER PER CELL 2.00 Reference Range: NEGATIVE HER2/CEP17 Ratio <2.0 and average HER2 copy number <4.0 EQUIVOCAL HER2/CEP17 Ratio <2.0 and average HER2 copy number >=4.0 and <6.0 POSITIVE HER2/CEP17 Ratio >=2.0 or <2.0 and average HER2 copy number >=6.0 Vicente Males MD Pathologist, Electronic Signature ( Signed 11/29/2016) 2. PROGNOSTIC INDICATORS Results: IMMUNOHISTOCHEMICAL AND MORPHOMETRIC ANALYSIS PERFORMED MANUALLY Estrogen Receptor: 0%, NEGATIVE Progesterone Receptor: 0%, NEGATIVE Proliferation Marker Ki67: 20% COMMENT: The negative hormone receptor study(ies) in this case has no internal positive control.   Diagnosis 03/14/16 Breast, lumpectomy, Left - HIGH GRADE DUCTAL CARCINOMA IN SITU WITH COMEDONECROSIS AND CALCIFICATIONS, SPANNING APPROXIMATELY 1.5 CM IN GREATEST DIMENSION. - ATYPICAL LOBULAR HYPERPLASIA. - DUCTAL CARCINOMA IN SITU IS LESS THAN 0.2 CM IN SEVERAL AREAS TO THE ANTERIOR MARGIN. 1 of 3 FINAL for Fernandez, Beth M  ([id number]) Diagnosis(continued) - DUCTAL CARCINOMA IN SITU IS FOCALLY LESS THAN 0.2 CM TO THE SUPERIOR AND POSTERIOR MARGINS. - OTHER MARGINS ARE NEGATIVE. - SEE ONCOLOGY TEMPLATE. Microscopic Comment BREAST, IN SITU CARCINOMA Specimen, including laterality: Left partial breast. Procedure (include lymph node sampling sentinel-non-sentinel): Left breast lumpectomy. Grade of carcinoma: High grade. Necrosis: Yes, comedonecrosis is present. Estimated tumor size: (gross measurement): 1.5 cm. Treatment effect: Not applicable. Distance to closest margin: Ductal carcinoma in situ is less than 0.2 cm from anterior, superior and posterior margins. Breast prognostic profile: Performed on previous case [id number]. Estrogen receptor: 0%, negative. Progesterone receptor: 0%, negative. Lymph nodes: No lymph nodes received. TNM: pTis, pNX. Comments: A p63, smooth muscle myosin and calponin immunohistochemical stain is performed on three blocks (nine stains total) to rule out the possibility of invasive carcinoma. The staining pattern fails to demonstrate evidence of invasive carcinoma. As estrogen receptor and progesterone receptor were previously negative, these markers will be repeated on the tumor and reported in an addendum to follow. (RH:ds 03/16/15) PROGNOSTIC INDICATORS Results: IMMUNOHISTOCHEMICAL AND MORPHOMETRIC ANALYSIS PERFORMED MANUALLY Estrogen Receptor: 0%, NEGATIVE Progesterone Receptor: 0%, NEGATIVE COMMENT: The negative hormone receptor study(ies) in this case has an internal positive control.    Diagnosis 10/13/2014 Breast, left, needle core biopsy, calcs - HIGH GRADE DUCTAL CARCINOMA IN SITU WITH CALCIFICATIONS AND NECROSIS. - SEE MICROSCOPIC DESCRIPTION. - FOCAL ATYPICAL LOBULAR HYPERPLASIA. Microscopic Comment Immunohistochemistry for basal cell  markers show positivity with Calponin, p63, and smooth muscle myosin supporting the diagnosis of high grade ductal carcinoma  in situ. Estrogen and progesterone receptors will be performed. Results: IMMUNOHISTOCHEMICAL AND MORPHOMETRIC ANALYSIS PERFORMED MANUALLY Estrogen Receptor: 0%, NEGATIVE Progesterone Receptor: 0%, NEGATIVE   RADIOGRAPHIC STUDIES: I have personally reviewed the radiological images as listed and agreed with the findings in the report.  PET 01/10/17  IMPRESSION: 1. Right subareolar mass measures 4.7 cm in long axis with maximum SUV 23.6 compatible with malignancy. There are 2 hypermetabolic mildly enlarged right axillary lymph nodes but no other metastatic lesions are identified. 2. Other imaging findings of potential clinical significance: Aortic Atherosclerosis (ICD10-I70.0). Coronary atherosclerosis. Mild cardiomegaly. Trace left pleural effusion. Intracranial chronic ischemic microvascular white matter disease.  Diagnostic Mammogram and [place] 11/26/16  IMPRESSION:  The is a 4.2 cm lobulated mass in the right breast central to the nipple anterior depth is highly suggestive of malignancy. The lymph node in the right axilla is at a moderate concern but not classic for malignancy. An ultrasound guided biopsy is recommended.     Mammogram 09/28/2014 There is a new cluster of pleomorphic calcifications in the left breast at 12:00 middle depth.   ASSESSMENT & PLAN: 84 y.o. Caucasian female, postmenopausal, with multiple comorbidities, including a stroke in January 2016, wheelchair bound, dementia, myocardial infarction, CHF, a resident at assisted living and h/o of left breast DCIS in 2016.    1. Infiltrating ductal carcinoma of right breast, invasive ductal carcinoma, cT 3N1M0, stage IIIb, G2, triple Negative -I previously reviewed her image findings and her biopsy results. She has aggressive triple negative breast cancer.  -On initial physical exam, she has a large central breast mass, with bulky right adenopathy.   -I previously reviewed her staging PET scan, which showed no distant  metastasis -Due to her rapidly growing disease she was evaluated for right mastectomy with Dr. Dalbert Batman, but due to her high risk of surgery and anesthesia secondary to her heart condition, patient's daughter has decided not to pursue mastectomy.   -Given her advanced age and comorbidities, she is not a candidate for chemotherapy.  -Due to her triple negative disease she is not a candidate for antiestrogen therapy.  -She completed palliative radiation 02/14/17-03/14/17, and her right breast tumor has shrunk some -Given her recurrent breast cancer and family history she is eligible for genetic testing. If her results show she has BRCA gene mutation she can consider PARP inhibitor. She declined during her genetic counseling consult.  -Physical exam shows her right breast mass and left axillary lymph node are getting smaller. Clinically she has had great response to radiation.  -I previously explained this cancer will at some point start growing again. I told patient's nursing staff member, If she observes increase in nodule size in right breast or other concerning symptoms she should contact our clinic to be seen sooner.  -Labs reviewed today, Hg normal, glucose 105, Cr at 2.14.  -She continues to show goof response to radiation as her right breast mass has decreased more. She only has breast pain upon palpation. She is doing well overall, stable.  -Continue supportive care and observation -F/u in 3 months   2. H/o Left breast DCIS, high-grade, ER negative/PR negative, 2016  -Previously reviewed her scan and breast biopsy results. -The patient had early stage disease. She is considered cured of disease after completing surgical resection.  -She was seen by Dr. Dalbert Batman, lumpectomy without sentinel lymph nodes biopsy was discussed with patient and  her daughter. Given her advanced age and medical comorbidities, she does have some risk with anesthesia. Patient and her daughter are in favor of having  surgery. Any form of adjuvant treatment is for prevention of disease recurrence.  -Given her negative ER and PR status of her tumor, I did not recommend any antiestrogen therapy.  -If she has wide negative surgical margin, she may not benefit much from adjuvant radiation and also, given her advanced age. She was seen by Dr. Pablo Ledger  -She underwent Left Breast Lumpectomy on 03/15/15 by Dr. Dalbert Batman    3. Dementia -Wheelchair bound, She can feed herself, otherwise she is under full scale care at [facility].  -Her daughter is her Legal guardian   4. HTN, H/o Stroke  -Her BP was significantly high at 215/113 today (09/29/17), she did not take her morning medication due to today's morning appointment. I encouraged her and her caregiver to take her anti-HTN medication as soon as she returns as these levels can cause a stroke. She should monitor her BP daily.    Plan  -Continue observation -Lab and f/u in 3 months    All questions were answered. The patient knows to call the clinic with any problems, questions or concerns. I spent 15 minutes counseling the patient face to face. The total time spent in the appointment was 20 minutes and more than 50% was on counseling.     Truitt Merle, MD 09/29/2017 11:27 AM   I, Joslyn Devon, am acting as scribe for Truitt Merle, MD.   I have reviewed the above documentation for accuracy and completeness, and I agree with the above.

## 2017-09-29 ENCOUNTER — Inpatient Hospital Stay: Payer: Medicare Other | Attending: Hematology

## 2017-09-29 ENCOUNTER — Telehealth: Payer: Self-pay | Admitting: Hematology

## 2017-09-29 ENCOUNTER — Encounter: Payer: Self-pay | Admitting: Hematology

## 2017-09-29 ENCOUNTER — Inpatient Hospital Stay (HOSPITAL_BASED_OUTPATIENT_CLINIC_OR_DEPARTMENT_OTHER): Payer: Medicare Other | Admitting: Hematology

## 2017-09-29 VITALS — BP 215/113 | HR 77 | Temp 98.0°F | Resp 17

## 2017-09-29 DIAGNOSIS — Z923 Personal history of irradiation: Secondary | ICD-10-CM

## 2017-09-29 DIAGNOSIS — Z86000 Personal history of in-situ neoplasm of breast: Secondary | ICD-10-CM | POA: Insufficient documentation

## 2017-09-29 DIAGNOSIS — C50111 Malignant neoplasm of central portion of right female breast: Secondary | ICD-10-CM

## 2017-09-29 DIAGNOSIS — I10 Essential (primary) hypertension: Secondary | ICD-10-CM

## 2017-09-29 DIAGNOSIS — C50412 Malignant neoplasm of upper-outer quadrant of left female breast: Secondary | ICD-10-CM

## 2017-09-29 DIAGNOSIS — Z993 Dependence on wheelchair: Secondary | ICD-10-CM | POA: Diagnosis not present

## 2017-09-29 DIAGNOSIS — I69354 Hemiplegia and hemiparesis following cerebral infarction affecting left non-dominant side: Secondary | ICD-10-CM

## 2017-09-29 DIAGNOSIS — F0390 Unspecified dementia without behavioral disturbance: Secondary | ICD-10-CM | POA: Diagnosis not present

## 2017-09-29 DIAGNOSIS — Z171 Estrogen receptor negative status [ER-]: Secondary | ICD-10-CM | POA: Diagnosis not present

## 2017-09-29 LAB — CBC WITH DIFFERENTIAL/PLATELET
Basophils Absolute: 0 10*3/uL (ref 0.0–0.1)
Basophils Relative: 0 %
Eosinophils Absolute: 0.1 10*3/uL (ref 0.0–0.5)
Eosinophils Relative: 1 %
HEMATOCRIT: 37.1 % (ref 34.8–46.6)
HEMOGLOBIN: 11.6 g/dL (ref 11.6–15.9)
LYMPHS ABS: 0.9 10*3/uL (ref 0.9–3.3)
LYMPHS PCT: 12 %
MCH: 29.4 pg (ref 25.1–34.0)
MCHC: 31.3 g/dL — ABNORMAL LOW (ref 31.5–36.0)
MCV: 94.2 fL (ref 79.5–101.0)
Monocytes Absolute: 0.8 10*3/uL (ref 0.1–0.9)
Monocytes Relative: 11 %
NEUTROS ABS: 5.8 10*3/uL (ref 1.5–6.5)
NEUTROS PCT: 76 %
Platelets: 214 10*3/uL (ref 145–400)
RBC: 3.94 MIL/uL (ref 3.70–5.45)
RDW: 15.1 % — ABNORMAL HIGH (ref 11.2–14.5)
WBC: 7.7 10*3/uL (ref 3.9–10.3)

## 2017-09-29 LAB — COMPREHENSIVE METABOLIC PANEL
ALT: 9 U/L (ref 0–44)
AST: 16 U/L (ref 15–41)
Albumin: 4.2 g/dL (ref 3.5–5.0)
Alkaline Phosphatase: 52 U/L (ref 38–126)
Anion gap: 11 (ref 5–15)
BUN: 28 mg/dL — ABNORMAL HIGH (ref 8–23)
CO2: 21 mmol/L — ABNORMAL LOW (ref 22–32)
Calcium: 9.3 mg/dL (ref 8.9–10.3)
Chloride: 109 mmol/L (ref 98–111)
Creatinine, Ser: 2.14 mg/dL — ABNORMAL HIGH (ref 0.44–1.00)
GFR calc Af Amer: 22 mL/min — ABNORMAL LOW (ref >60)
GFR calc non Af Amer: 19 mL/min — ABNORMAL LOW (ref >60)
Glucose, Bld: 105 mg/dL — ABNORMAL HIGH (ref 70–99)
Potassium: 3.9 mmol/L (ref 3.5–5.1)
Sodium: 141 mmol/L (ref 135–145)
Total Bilirubin: 0.8 mg/dL (ref 0.3–1.2)
Total Protein: 7.5 g/dL (ref 6.5–8.1)

## 2017-09-29 NOTE — Telephone Encounter (Signed)
Scheduled appt per 8/12 los - gave patient AVS and calender per los.   

## 2017-10-01 ENCOUNTER — Encounter: Payer: Self-pay | Admitting: Internal Medicine

## 2017-10-21 ENCOUNTER — Encounter: Payer: Self-pay | Admitting: Internal Medicine

## 2017-10-21 ENCOUNTER — Non-Acute Institutional Stay (SKILLED_NURSING_FACILITY): Payer: Medicare Other | Admitting: Internal Medicine

## 2017-10-21 DIAGNOSIS — K219 Gastro-esophageal reflux disease without esophagitis: Secondary | ICD-10-CM | POA: Diagnosis not present

## 2017-10-21 DIAGNOSIS — N184 Chronic kidney disease, stage 4 (severe): Secondary | ICD-10-CM

## 2017-10-21 DIAGNOSIS — M159 Polyosteoarthritis, unspecified: Secondary | ICD-10-CM | POA: Diagnosis not present

## 2017-10-21 DIAGNOSIS — I110 Hypertensive heart disease with heart failure: Secondary | ICD-10-CM | POA: Diagnosis not present

## 2017-10-21 DIAGNOSIS — D638 Anemia in other chronic diseases classified elsewhere: Secondary | ICD-10-CM

## 2017-10-21 DIAGNOSIS — F0150 Vascular dementia without behavioral disturbance: Secondary | ICD-10-CM

## 2017-10-21 DIAGNOSIS — I5022 Chronic systolic (congestive) heart failure: Secondary | ICD-10-CM

## 2017-10-21 NOTE — Progress Notes (Signed)
Location:  [facility] Room Number: 5 Place of Service:  SNF [phone]) Provider:  [name] MD  [name], MD  Patient Care Team: [name], MD as PCP - General (Internal Medicine) Juanita Craver, MD as Consulting Physician (Gastroenterology) Shon Hough, MD as Consulting Physician (Ophthalmology) Thornell Sartorius, MD as Consulting Physician (Otolaryngology) [place], [place] Mast, Man X, NP as Nurse Practitioner (Nurse Practitioner) Bo Merino, MD as Consulting Physician (Rheumatology) Suella Broad, MD as Consulting Physician (Physical Medicine and Rehabilitation) Netta Cedars, MD as Consulting Physician (Orthopedic Surgery) Fanny Skates, MD as Consulting Physician (General Surgery) Truitt Merle, MD as Consulting Physician (Hematology) Thea Silversmith, MD as Consulting Physician (Radiation Oncology) Rockwell Germany, RN as Registered Nurse Mauro Kaufmann, RN as Registered Nurse Jake Shark, Johny Blamer, NP as Nurse Practitioner (Nurse Practitioner) [name], MD as Consulting Physician (Cardiology)  Extended Emergency Contact Information Primary Emergency Contact: [name],[name] Address: [address]          [place] [address] [place] of [place] Phone: [phone] Mobile Phone: [phone] Relation: Daughter Secondary Emergency Contact: [name],[name]  [place] Mobile Phone: [phone] Relation: None  Code Status:  DNR  Goals of care: Advanced Directive information Advanced Directives 09/19/2017  Does Patient Have a Medical Advance Directive? Yes  Type of Advance Directive Out of facility DNR (pink MOST or yellow form)  Does patient want to make changes to medical advance directive? No - Patient declined  Copy of [place] in Chart? -  Pre-existing out of facility DNR order (yellow form or pink MOST form) Yellow form placed in chart (order not valid for  inpatient use);Pink MOST form placed in chart (order not valid for inpatient use)     Chief Complaint  Patient presents with  . Medical Management of Chronic Issues    F/u-CKD, vascular dementia, anemia, HTN, GERD,    HPI:  Pt is a 84 y.o. female seen today for medical management of chronic diseases.   Hypertension with CHF- elevated SBP on chart review with several readings with SBP >150.  Currently on hydralazine 75 mg tid with imdur 60 mg daily and coreg 12.5 mg bid. Also on clonidine 0.1 mg bid. Takes torsemide and kcl as well  OA- denies pain this visit. Takes tylenol 650 mg tid and q6h prn pain  gerd- denies reflux symptom this visit. Takes famotidine 20 mg daily  Anemia of chronic disease- takes ferrous sulfate 325 mg daily with b12 and FA supplement  Vascular dementia- needs assistance carrying out her ADLs. No fall reported  ckd 4- good urine output. Denies urinary complaints    Past Medical History:  Diagnosis Date  . Arthritis    Osteoarthritis  . Cancer of left breast [facility]) August 2016  . Cerebral atherosclerosis 12/19/2012  . Cerebral atrophy 12/19/2012  . Cerebral ischemia 12/19/2012   Microvascular   . CKD (chronic kidney disease) stage 3, GFR 30-59 ml/min (HCC) 11/03/2014  . Cough   . Dementia   . Diverticulosis of colon (without mention of hemorrhage)   . Edema   . Family history of breast cancer   . Flat feet   . GERD (gastroesophageal reflux disease)   . Glaucoma   . Heart murmur   . Hyperlipidemia   . Hypertension   . Hypertonicity of bladder   . Ischemic cardiomyopathy 11/04/2014  . Lumbago   . Memory change 03/28/2016  . Osteoarthrosis, unspecified whether generalized or localized, unspecified site   .  Other malaise and fatigue   . Pain in joint, pelvic region and thigh   . Pain in joint, shoulder region   . Personal history of fall   . RLQ abdominal pain 01/17/2011  . Senile osteoporosis   . Small bowel obstruction ([place])   . STEMI (ST  elevation myocardial infarction) ([place]) 11/04/2014  . Unstable gait 11/04/2014  . Urine, incontinence, stress female 09/18/2014   Past Surgical History:  Procedure Laterality Date  . BREAST LUMPECTOMY Bilateral   . BREAST LUMPECTOMY WITH RADIOACTIVE SEED LOCALIZATION Left 03/15/2015   Procedure: LEFT BREAST LUMPECTOMY WITH RADIOACTIVE SEED LOCALIZATION;  Surgeon: Fanny Skates, MD;  Location: [facility];  Service: General;  Laterality: Left;  . CARDIAC CATHETERIZATION N/A 11/08/2014   Procedure: Left Heart Cath and Coronary Angiography;  Surgeon: [name], MD;  Location: [facility] CV LAB;  Service: Cardiovascular;  Laterality: N/A;  . COLON SURGERY  02/1996   Colectomy, partial for diverticular bleed 90%  . EYE SURGERY    . JOINT REPLACEMENT Left 12/2000   Left knee; Dr. Wynelle Link  . LAPAROSCOPIC LYSIS OF ADHESIONS  11/15/10   Exploratory  . TONSILLECTOMY      Allergies  Allergen Reactions  . Sulfa Antibiotics Itching    Outpatient Encounter Medications as of 10/21/2017  Medication Sig  . acetaminophen (TYLENOL) 325 MG tablet Take 650 mg by mouth every 6 (six) hours as needed.   Marland Kitchen acetaminophen (TYLENOL) 325 MG tablet Take 650 mg by mouth 3 (three) times daily.  Marland Kitchen atorvastatin (LIPITOR) 10 MG tablet Take 10 mg by mouth daily.  . carboxymethylcellulose (REFRESH PLUS) 0.5 % SOLN Apply 2 drops to eye 2 (two) times daily.  . carvedilol (COREG) 12.5 MG tablet Take 12.5 mg by mouth 2 (two) times daily with a meal.  . cloNIDine (CATAPRES) 0.1 MG tablet Take 0.1 mg by mouth 2 (two) times daily.  . Coenzyme Q10 50 MG TABS Take one tablet by mouth at bedtime  . famotidine (PEPCID) 20 MG tablet Take 20 mg by mouth at bedtime.   . ferrous sulfate 325 (65 FE) MG tablet Take 325 mg by mouth daily with breakfast.  . folic acid (FOLVITE) 1 MG tablet Take 1 mg by mouth daily.  . hydrALAZINE (APRESOLINE) 25 MG tablet Take 75 mg by mouth 3 (three) times daily.  . isosorbide mononitrate (IMDUR) 60 MG 24  hr tablet Take 60 mg by mouth daily.  Marland Kitchen KLOR-CON 10 10 MEQ tablet Take 10 mEq by mouth daily.  Marland Kitchen latanoprost (XALATAN) 0.005 % ophthalmic solution Place 1 drop into both eyes at bedtime.   . timolol (TIMOPTIC-XR) 0.5 % ophthalmic gel-forming Place 1 drop into both eyes daily.   Marland Kitchen torsemide (DEMADEX) 10 MG tablet Take 10 mg daily by mouth. Hold for SBP < 110  . vitamin B-12 (CYANOCOBALAMIN) 1000 MCG tablet Take 1,000 mcg See admin instructions by mouth. Take 1000 mcg by mouth daily On Mondays,Wednesdays and Fridays  . [DISCONTINUED] hydrALAZINE (APRESOLINE) 50 MG tablet Take 75 mg by mouth 3 (three) times daily.    No facility-administered encounter medications on file as of 10/21/2017.     Review of Systems  Unable to perform ROS: Dementia  Constitutional: Negative for chills and fever.  HENT: Positive for hearing loss. Negative for mouth sores, sore throat and trouble swallowing.   Eyes: Positive for visual disturbance.  Respiratory: Negative for shortness of breath.   Cardiovascular: Negative for chest pain and palpitations.  Gastrointestinal: Negative for abdominal pain,  diarrhea and vomiting.  Genitourinary: Positive for frequency. Negative for dysuria.  Musculoskeletal: Positive for arthralgias and gait problem.  Skin: Negative for rash.  Neurological: Negative for dizziness and headaches.  Hematological: Bruises/bleeds easily.  Psychiatric/Behavioral: Positive for confusion and decreased concentration. Negative for behavioral problems.    Immunization History  Administered Date(s) Administered  . Influenza Whole 11/19/2011, 12/02/2012  . Influenza-Unspecified 12/06/2013, 12/06/2015, 12/02/2016  . PPD Test 11/10/2014  . Pneumococcal Conjugate-13 03/09/2009  . Pneumococcal Polysaccharide-23 02/18/2009  . Tdap 04/02/2016   Pertinent  Health Maintenance Due  Topic Date Due  . INFLUENZA VACCINE  09/18/2017  . MAMMOGRAM  11/26/2017  . DEXA SCAN  Completed  . PNA vac Low Risk  Adult  Completed   Fall Risk  02/05/2017 01/31/2017 07/13/2015 11/17/2014 04/21/2014  Falls in the past year? No No No Yes Yes  Number falls in past yr: - - - 1 1  Injury with Fall? - - - No No  Comment - - - - -  Risk for fall due to : - History of fall(s);Impaired balance/gait;Impaired vision - Impaired balance/gait;History of fall(s) -   Functional Status Survey:    Vitals:   10/21/17 1420  BP: (!) 151/96  Pulse: 85  Resp: (!) 22  Temp: (!) 97.5 F (36.4 C)  SpO2: 97%  Weight: 152 lb 1.6 oz (69 kg)  Height: 5\' 5"  (1.651 m)   Body mass index is 25.31 kg/m.   Wt Readings from Last 3 Encounters:  10/21/17 152 lb 1.6 oz (69 kg)  09/19/17 153 lb (69.4 kg)  09/11/17 153 lb 14.4 oz (69.8 kg)   Physical Exam  Constitutional: No distress.  Frail, elderly female  HENT:  Head: Normocephalic and atraumatic.  Mouth/Throat: Oropharynx is clear and moist.  Eyes: Pupils are equal, round, and reactive to light. Conjunctivae and EOM are normal. Right eye exhibits no discharge. Left eye exhibits no discharge.  Neck: Normal range of motion. Neck supple.  Cardiovascular: Normal rate and regular rhythm.  Pulmonary/Chest: Effort normal and breath sounds normal. She has no wheezes. She has no rales.  Abdominal: Soft. Bowel sounds are normal. She exhibits no mass. There is no tenderness. There is no guarding.  Musculoskeletal: Normal range of motion. She exhibits edema.  Wheelchair bound, needs assistance with transfer, can move all 4 extremities  Lymphadenopathy:    She has no cervical adenopathy.  Neurological: She is alert.  Oriented to self  Skin: Skin is warm and dry. She is not diaphoretic.    Labs reviewed: Recent Labs    03/31/17 0836  06/30/17 0820 07/10/17 09/04/17 09/29/17 0836  NA 139   < > 140 140 139 141  K 4.0   < > 3.7 3.5 3.9 3.9  CL 106  --  112* 109 108 109  CO2 23  --  19* 8.9 22 21*  GLUCOSE 97  --  141*  --   --  105*  BUN 25   < > 26 24* 30* 28*  CREATININE  2.22*   < > 2.19* 2.1* 2.3* 2.14*  CALCIUM 9.3  --  8.8 8.9 8.5 9.3   < > = values in this interval not displayed.   Recent Labs    03/31/17 0836  06/30/17 0820 07/10/17 09/04/17 09/29/17 0836  AST 15   < > 12 13 11* 16  ALT <6   < > 9 5* 6* 9  ALKPHOS 53   < > 46 40 35 52  BILITOT  0.5  --  0.4  --   --  0.8  PROT 7.1  --  6.9  --  5.8 7.5  ALBUMIN 3.8  --  3.9 4.2 3.6 4.2   < > = values in this interval not displayed.   Recent Labs    03/31/17 0836  06/30/17 0820 07/01/17 09/23/17 09/29/17 0836  WBC 4.9   < > 6.0 6.0 5.4 7.7  NEUTROABS 3.3  --  4.1  --   --  5.8  HGB 10.9*   < > 10.6* 10.8* 9.3* 11.6  HCT 33.7*   < > 34.0* 33* 29* 37.1  MCV 90.3  --  91.2  --   --  94.2  PLT 190   < > 208 225 207 214   < > = values in this interval not displayed.   Lab Results  Component Value Date   TSH 2.21 04/04/2016   Lab Results  Component Value Date   HGBA1C 5.6 11/06/2014   Lab Results  Component Value Date   CHOL 110 04/30/2017   HDL 41 04/30/2017   LDLCALC 50 04/30/2017   TRIG 111 04/30/2017   CHOLHDL 3.1 11/06/2014    Significant Diagnostic Results in last 30 days:  No results found.  Assessment/Plan  1. Hypertensive heart disease with chronic systolic congestive heart failure (HCC) Increase coreg to 25 mg bid. Continue hydralazine, clonidine, imdur and torsemide. Monitor bmp. Weight reviewed.   2. Gastroesophageal reflux disease without esophagitis Continue famotidine and monitor  3. Vascular dementia without behavioral disturbance Supportive care, bp medication changes as above, review lab  4. Generalized osteoarthritis Continue tylenol current regimen, assist with ADLs.   5. CKD (chronic kidney disease) stage 4, GFR 15-29 ml/min (HCC) Monitor renal function  6. Anemia of chronic disease Continue F00, folic acid and iron supplement reviewed cbc    Family/ staff Communication: reviewed care plan with patient and charge nurse.    Labs/tests  ordered:  none   [name], MD Internal Medicine [facility] [address] [place], [place] [address] Cell Phone (Monday-Friday 8 am - 5 pm): [phone] On Call: [phone] and follow prompts after 5 pm and on weekends Office Phone: [phone] Office Fax: [phone]

## 2017-11-04 DIAGNOSIS — E785 Hyperlipidemia, unspecified: Secondary | ICD-10-CM | POA: Diagnosis not present

## 2017-11-04 LAB — LIPID PANEL
CHOLESTEROL: 113 (ref 0–200)
HDL: 40 (ref 35–70)
LDL Cholesterol: 50
LDL/HDL RATIO: 2.8
TRIGLYCERIDES: 150 (ref 40–160)

## 2017-11-05 ENCOUNTER — Other Ambulatory Visit: Payer: Self-pay | Admitting: *Deleted

## 2017-11-11 ENCOUNTER — Encounter: Payer: Self-pay | Admitting: Nurse Practitioner

## 2017-11-11 ENCOUNTER — Non-Acute Institutional Stay (SKILLED_NURSING_FACILITY): Payer: Medicare Other | Admitting: Nurse Practitioner

## 2017-11-11 DIAGNOSIS — R0902 Hypoxemia: Secondary | ICD-10-CM

## 2017-11-11 DIAGNOSIS — I10 Essential (primary) hypertension: Secondary | ICD-10-CM

## 2017-11-11 DIAGNOSIS — R000 Tachycardia, unspecified: Secondary | ICD-10-CM

## 2017-11-11 DIAGNOSIS — M159 Polyosteoarthritis, unspecified: Secondary | ICD-10-CM

## 2017-11-11 DIAGNOSIS — I5043 Acute on chronic combined systolic (congestive) and diastolic (congestive) heart failure: Secondary | ICD-10-CM

## 2017-11-11 DIAGNOSIS — R3915 Urgency of urination: Secondary | ICD-10-CM

## 2017-11-11 DIAGNOSIS — R300 Dysuria: Secondary | ICD-10-CM | POA: Diagnosis not present

## 2017-11-11 DIAGNOSIS — D72829 Elevated white blood cell count, unspecified: Secondary | ICD-10-CM

## 2017-11-11 DIAGNOSIS — R509 Fever, unspecified: Secondary | ICD-10-CM

## 2017-11-11 DIAGNOSIS — R350 Frequency of micturition: Secondary | ICD-10-CM | POA: Insufficient documentation

## 2017-11-11 NOTE — Assessment & Plan Note (Signed)
Obtain UA C/S to r/i UTI

## 2017-11-11 NOTE — Assessment & Plan Note (Addendum)
Obtain UA C/S to r/i UTI. 11/13/17 indicated may be contaminated urine specimen, will monitor for for s/s of UTI since Rocephin started for fever and leukocytosis.

## 2017-11-11 NOTE — Assessment & Plan Note (Signed)
Will continue Clonidine 0.1mg  bid, Hydralazine 75mg  tid, Coreg 25mg  bid, monitor Bp

## 2017-11-11 NOTE — Assessment & Plan Note (Signed)
Associated with rapid heart beats and O2 desaturation, dysuria, urinary urgency, urinary frequency,  will obtain CXR, CBC/diff, CMP, UA C/S, will administer Rocephin 1mg  IM for now since th patient is s/p radiation for right breast cancer.

## 2017-11-11 NOTE — Assessment & Plan Note (Signed)
EKG: SR vent rate about 100bpm, no significant STT changes. Observe.

## 2017-11-11 NOTE — Assessment & Plan Note (Signed)
Continue Tylenol 650mg  tid, no pain reported upon my visit today.

## 2017-11-11 NOTE — Assessment & Plan Note (Signed)
11/11/17 Na 139, K 3.4, Bun 36, creat 2.72, TP 6.1, albumin 3.7, wbc 19.7, Hgb 10.7, plt 179, neutrophils 93.8%. Rocephin 1gm bid x 7 days.  11/11/17 CXR central pulmonary venous congestion/CHF without fank pulmonary edema.

## 2017-11-11 NOTE — Assessment & Plan Note (Addendum)
In setting of CHF, will apply O2 2lpm via [place] to maintain O2 sat >89%, monitor VS Sat O2 q shift x 72 hours. Obtain CXR

## 2017-11-11 NOTE — Assessment & Plan Note (Signed)
Continue Torsemide 10mg  qd, obtain CXR, monitor for s/s of developing acute CHF

## 2017-11-11 NOTE — Progress Notes (Signed)
Location:  [place] Room Number: 5 Place of Service:  SNF (31) Provider:  Mast, ManXie  NP  [name], MD  Patient Care Team: [name], MD as PCP - General (Internal Medicine) Juanita Craver, MD as Consulting Physician (Gastroenterology) Shon Hough, MD as Consulting Physician (Ophthalmology) Thornell Sartorius, MD as Consulting Physician (Otolaryngology) [place] Mast, Man X, NP as Nurse Practitioner (Nurse Practitioner) Bo Merino, MD as Consulting Physician (Rheumatology) Suella Broad, MD as Consulting Physician (Physical Medicine and Rehabilitation) Netta Cedars, MD as Consulting Physician (Orthopedic Surgery) Fanny Skates, MD as Consulting Physician (General Surgery) Truitt Merle, MD as Consulting Physician (Hematology) Thea Silversmith, MD as Consulting Physician (Radiation Oncology) Rockwell Germany, RN as Registered Nurse Mauro Kaufmann, RN as Registered Nurse Jake Shark, Johny Blamer, NP as Nurse Practitioner (Nurse Practitioner) [name], MD as Consulting Physician (Cardiology)  Extended Emergency Contact Information Primary Emergency Contact: [name],[name] Address: [address]          [place] [address] Johnnette Litter of [place] Phone: [phone] Mobile Phone: [phone] Relation: Daughter Secondary Emergency Contact: [name],[name]  [place] Mobile Phone: [phone] Relation: None  Code Status:  DNR Goals of care: Advanced Directive information Advanced Directives 11/11/2017  Does Patient Have a Medical Advance Directive? Yes  Type of Advance Directive Out of facility DNR (pink MOST or yellow form)  Does patient want to make changes to medical advance directive? No - Patient declined  Copy of [place] in Chart? -  Pre-existing out of facility DNR order (yellow form or pink MOST form) Yellow form placed in chart (order not valid for  inpatient use);Pink MOST form placed in chart (order not valid for inpatient use)     Chief Complaint  Patient presents with  . Acute Visit    c/o dysuria, urinary frequency and urgency    HPI:  Pt is a 84 y.o. female seen today for an acute visit for dysuria, urinary frequency, urinanry urgency  fever, malaise, rapid heart beats, elevated BP, and O2 desaturation x 1 day. The patient is alert, able to follow simple directions, seen in bed, no new focal neurological symptoms. She denied cough, chest pain, or wheezing. No noted nausea, vomiting, constipation, or diarrhea. She denied vision change, headache, or dizziness. Hx of dementia, resides in SNF [facility] for safety and care assistance, HPI was provided with assistance of staff. Hx of HTN, on Clonidine 0.1mg  bid, Torsemide 10mg  qd, Hydralazine 75mg  tid, Coreg 25mg  bid, Isosorbide 60mg  qd. Chronic knee pain is managed with Tylenol 650mg  tid. Hx of CHF, on Torsemide 10mg  qd.    Past Medical History:  Diagnosis Date  . Arthritis    Osteoarthritis  . Cancer of left breast [facility]) August 2016  . Cerebral atherosclerosis 12/19/2012  . Cerebral atrophy 12/19/2012  . Cerebral ischemia 12/19/2012   Microvascular   . CKD (chronic kidney disease) stage 3, GFR 30-59 ml/min (HCC) 11/03/2014  . Cough   . Dementia   . Diverticulosis of colon (without mention of hemorrhage)   . Edema   . Family history of breast cancer   . Flat feet   . GERD (gastroesophageal reflux disease)   . Glaucoma   . Heart murmur   . Hyperlipidemia   . Hypertension   . Hypertonicity of bladder   . Ischemic cardiomyopathy 11/04/2014  . Lumbago   . Memory change 03/28/2016  . Osteoarthrosis, unspecified whether generalized or localized, unspecified site   .  Other malaise and fatigue   . Pain in joint, pelvic region and thigh   . Pain in joint, shoulder region   . Personal history of fall   . RLQ abdominal pain 01/17/2011  . Senile osteoporosis   . Small bowel obstruction  ([place])   . STEMI (ST elevation myocardial infarction) ([place]) 11/04/2014  . Unstable gait 11/04/2014  . Urine, incontinence, stress female 09/18/2014   Past Surgical History:  Procedure Laterality Date  . BREAST LUMPECTOMY Bilateral   . BREAST LUMPECTOMY WITH RADIOACTIVE SEED LOCALIZATION Left 03/15/2015   Procedure: LEFT BREAST LUMPECTOMY WITH RADIOACTIVE SEED LOCALIZATION;  Surgeon: Fanny Skates, MD;  Location: [place];  Service: General;  Laterality: Left;  . CARDIAC CATHETERIZATION N/A 11/08/2014   Procedure: Left Heart Cath and Coronary Angiography;  Surgeon: [name], MD;  Location: [place] CV LAB;  Service: Cardiovascular;  Laterality: N/A;  . COLON SURGERY  02/1996   Colectomy, partial for diverticular bleed 90%  . EYE SURGERY    . JOINT REPLACEMENT Left 12/2000   Left knee; Dr. Wynelle Link  . LAPAROSCOPIC LYSIS OF ADHESIONS  11/15/10   Exploratory  . TONSILLECTOMY      Allergies  Allergen Reactions  . Sulfa Antibiotics Itching    Outpatient Encounter Medications as of 11/11/2017  Medication Sig  . acetaminophen (TYLENOL) 325 MG tablet Take 650 mg by mouth every 6 (six) hours as needed.   Marland Kitchen acetaminophen (TYLENOL) 325 MG tablet Take 650 mg by mouth 3 (three) times daily.  Marland Kitchen atorvastatin (LIPITOR) 10 MG tablet Take 10 mg by mouth daily.  . carboxymethylcellulose (REFRESH PLUS) 0.5 % SOLN Apply 2 drops to eye 2 (two) times daily.  . carvedilol (COREG) 25 MG tablet Take 25 mg by mouth 2 (two) times daily with a meal.  . cloNIDine (CATAPRES) 0.1 MG tablet Take 0.1 mg by mouth 2 (two) times daily.  . Coenzyme Q10 50 MG TABS Take one tablet by mouth at bedtime  . famotidine (PEPCID) 20 MG tablet Take 20 mg by mouth at bedtime.   . ferrous sulfate 325 (65 FE) MG tablet Take 325 mg by mouth daily with breakfast.  . folic acid (FOLVITE) 1 MG tablet Take 1 mg by mouth daily.  . hydrALAZINE (APRESOLINE) 25 MG tablet Take 75 mg by mouth 3 (three) times daily.  . isosorbide mononitrate  (IMDUR) 30 MG 24 hr tablet Take 90 mg by mouth daily. Hold for BP <110  . KLOR-CON 10 10 MEQ tablet Take 10 mEq by mouth daily.  Marland Kitchen latanoprost (XALATAN) 0.005 % ophthalmic solution Place 1 drop into both eyes at bedtime.   . timolol (TIMOPTIC-XR) 0.5 % ophthalmic gel-forming Place 1 drop into both eyes daily.   Marland Kitchen torsemide (DEMADEX) 10 MG tablet Take 10 mg daily by mouth. Hold for SBP < 110  . vitamin B-12 (CYANOCOBALAMIN) 1000 MCG tablet Take 1,000 mcg See admin instructions by mouth. Take 1000 mcg by mouth daily On Mondays,Wednesdays and Fridays  . [DISCONTINUED] carvedilol (COREG) 12.5 MG tablet Take 12.5 mg by mouth 2 (two) times daily with a meal.  . [DISCONTINUED] isosorbide mononitrate (IMDUR) 60 MG 24 hr tablet Take 60 mg by mouth daily.   No facility-administered encounter medications on file as of 11/11/2017.    ROS was provided with assistance of staff Review of Systems  Constitutional: Positive for fatigue and fever. Negative for appetite change, chills and diaphoresis.  HENT: Positive for hearing loss. Negative for congestion, sinus pressure, sore throat, trouble  swallowing and voice change.   Eyes: Negative for visual disturbance.  Respiratory: Positive for shortness of breath. Negative for apnea, cough, chest tightness and wheezing.   Cardiovascular: Positive for leg swelling. Negative for chest pain and palpitations.  Gastrointestinal: Negative for abdominal distention, abdominal pain, constipation, diarrhea, nausea and vomiting.  Genitourinary: Positive for dysuria, frequency and urgency. Negative for difficulty urinating, flank pain and hematuria.  Musculoskeletal: Positive for arthralgias and gait problem.  Neurological: Negative for dizziness, facial asymmetry, speech difficulty, weakness and headaches.       Memory lapses.   Psychiatric/Behavioral: Negative for agitation, behavioral problems, hallucinations and sleep disturbance. The patient is not nervous/anxious.      Immunization History  Administered Date(s) Administered  . Influenza Whole 11/19/2011, 12/02/2012  . Influenza-Unspecified 12/06/2013, 12/06/2015, 12/02/2016  . PPD Test 11/10/2014  . Pneumococcal Conjugate-13 03/09/2009  . Pneumococcal Polysaccharide-23 02/18/2009  . Tdap 04/02/2016   Pertinent  Health Maintenance Due  Topic Date Due  . INFLUENZA VACCINE  09/18/2017  . MAMMOGRAM  11/26/2017  . DEXA SCAN  Completed  . PNA vac Low Risk Adult  Completed   Fall Risk  02/05/2017 01/31/2017 07/13/2015 11/17/2014 04/21/2014  Falls in the past year? No No No Yes Yes  Number falls in past yr: - - - 1 1  Injury with Fall? - - - No No  Comment - - - - -  Risk for fall due to : - History of fall(s);Impaired balance/gait;Impaired vision - Impaired balance/gait;History of fall(s) -   Functional Status Survey:    Vitals:   11/11/17 1457  BP: (!) 144/84  Pulse: 84  Resp: (!) 22  Temp: (!) 101.2 F (38.4 C)  Weight: 154 lb (69.9 kg)  Height: 5\' 5"  (1.651 m)   Body mass index is 25.63 kg/m. Physical Exam  Constitutional: She appears well-developed and well-nourished. No distress.  HENT:  Head: Normocephalic and atraumatic.  Mouth/Throat: Oropharynx is clear and moist.  Eyes: Pupils are equal, round, and reactive to light. Conjunctivae and EOM are normal.  Neck: Normal range of motion. Neck supple. No JVD present. No thyromegaly present.  Cardiovascular: Normal rate and regular rhythm.  Murmur heard. Pulmonary/Chest: She has no wheezes. She has rales.  Posterior lower lung bases crackles.   Abdominal: Soft. She exhibits no distension. There is no tenderness. There is no rebound and no guarding.  Musculoskeletal: She exhibits edema.  Trace edema in ankles.   Neurological: She is alert. No cranial nerve deficit. She exhibits normal muscle tone. Coordination normal.  Oriented to person and her room on unit.   Skin: Skin is warm and dry. She is not diaphoretic.  Psychiatric: She  has a normal mood and affect. Her behavior is normal.    Labs reviewed: Recent Labs    03/31/17 0836  06/30/17 0820 07/10/17 09/04/17 09/29/17 0836  NA 139   < > 140 140 139 141  K 4.0   < > 3.7 3.5 3.9 3.9  CL 106  --  112* 109 108 109  CO2 23  --  19* 8.9 22 21*  GLUCOSE 97  --  141*  --   --  105*  BUN 25   < > 26 24* 30* 28*  CREATININE 2.22*   < > 2.19* 2.1* 2.3* 2.14*  CALCIUM 9.3  --  8.8 8.9 8.5 9.3   < > = values in this interval not displayed.   Recent Labs    03/31/17 0836  06/30/17 0820 07/10/17  09/04/17 09/29/17 0836  AST 15   < > 12 13 11* 16  ALT <6   < > 9 5* 6* 9  ALKPHOS 53   < > 46 40 35 52  BILITOT 0.5  --  0.4  --   --  0.8  PROT 7.1  --  6.9  --  5.8 7.5  ALBUMIN 3.8  --  3.9 4.2 3.6 4.2   < > = values in this interval not displayed.   Recent Labs    03/31/17 0836  06/30/17 0820 07/01/17 09/23/17 09/29/17 0836  WBC 4.9   < > 6.0 6.0 5.4 7.7  NEUTROABS 3.3  --  4.1  --   --  5.8  HGB 10.9*   < > 10.6* 10.8* 9.3* 11.6  HCT 33.7*   < > 34.0* 33* 29* 37.1  MCV 90.3  --  91.2  --   --  94.2  PLT 190   < > 208 225 207 214   < > = values in this interval not displayed.   Lab Results  Component Value Date   TSH 2.21 04/04/2016   Lab Results  Component Value Date   HGBA1C 5.6 11/06/2014   Lab Results  Component Value Date   CHOL 113 11/04/2017   HDL 40 11/04/2017   LDLCALC 50 11/04/2017   TRIG 150 11/04/2017   CHOLHDL 3.1 11/06/2014    Significant Diagnostic Results in last 30 days:  No results found.  Assessment/Plan Fever Associated with rapid heart beats and O2 desaturation, dysuria, urinary urgency, urinary frequency,  will obtain CXR, CBC/diff, CMP, UA C/S, will administer Rocephin 1mg  IM for now since th patient is s/p radiation for right breast cancer.   Tachycardia EKG: SR vent rate about 100bpm, no significant STT changes. Observe.   Hypoxemia In setting of CHF, will apply O2 2lpm via [place] to maintain O2 sat >89%, monitor VS  Sat O2 q shift x 72 hours. Obtain CXR  Dysuria Obtain UA C/S to r/i UTI. 11/13/17 indicated may be contaminated urine specimen, will monitor for for s/s of UTI since Rocephin started for fever and leukocytosis.    Urinary frequency Obtain UA C/S to r/i UTI   Urinary urgency Obtain UA C/S to r/i UTI   Accelerated hypertension Will continue Clonidine 0.1mg  bid, Hydralazine 75mg  tid, Coreg 25mg  bid, monitor Bp  Acute on chronic combined systolic and diastolic CHF (congestive heart failure) (HCC) Continue Torsemide 10mg  qd, obtain CXR, monitor for s/s of developing acute CHF  Generalized osteoarthritis Continue Tylenol 650mg  tid, no pain reported upon my visit today.   Leukocytosis 11/11/17 Na 139, K 3.4, Bun 36, creat 2.72, TP 6.1, albumin 3.7, wbc 19.7, Hgb 10.7, plt 179, neutrophils 93.8%. Rocephin 1gm bid x 7 days.  11/11/17 CXR central pulmonary venous congestion/CHF without fank pulmonary edema.      Family/ staff Communication: plan of care reviewed with the patient and charge nurse.    Labs/tests ordered:  CXR, EKG, CBC/diff, CMP, UA C/S  Time spend 35 minutes.

## 2017-11-13 ENCOUNTER — Encounter: Payer: Self-pay | Admitting: Nurse Practitioner

## 2017-11-18 ENCOUNTER — Encounter: Payer: Self-pay | Admitting: Nurse Practitioner

## 2017-11-18 ENCOUNTER — Non-Acute Institutional Stay (SKILLED_NURSING_FACILITY): Payer: Medicare Other | Admitting: Nurse Practitioner

## 2017-11-18 DIAGNOSIS — F0150 Vascular dementia without behavioral disturbance: Secondary | ICD-10-CM | POA: Diagnosis not present

## 2017-11-18 DIAGNOSIS — I10 Essential (primary) hypertension: Secondary | ICD-10-CM | POA: Diagnosis not present

## 2017-11-18 DIAGNOSIS — N184 Chronic kidney disease, stage 4 (severe): Secondary | ICD-10-CM | POA: Diagnosis not present

## 2017-11-18 DIAGNOSIS — D72829 Elevated white blood cell count, unspecified: Secondary | ICD-10-CM

## 2017-11-18 DIAGNOSIS — I5032 Chronic diastolic (congestive) heart failure: Secondary | ICD-10-CM

## 2017-11-18 DIAGNOSIS — R509 Fever, unspecified: Secondary | ICD-10-CM

## 2017-11-18 NOTE — Progress Notes (Signed)
Location:  [place] Room Number: 5 Place of Service:  SNF (31) Provider:  Miarose Lippert, Lennie Odor  NP  Nyonna Hargrove X, NP  Patient Care Team: Jameica Couts X, NP as PCP - General (Internal Medicine) Juanita Craver, MD as Consulting Physician (Gastroenterology) Shon Hough, MD as Consulting Physician (Ophthalmology) Thornell Sartorius, MD as Consulting Physician (Otolaryngology) [place] Brylinn Teaney X, NP as Nurse Practitioner (Nurse Practitioner) Bo Merino, MD as Consulting Physician (Rheumatology) Suella Broad, MD as Consulting Physician (Physical Medicine and Rehabilitation) Netta Cedars, MD as Consulting Physician (Orthopedic Surgery) Fanny Skates, MD as Consulting Physician (General Surgery) Truitt Merle, MD as Consulting Physician (Hematology) Thea Silversmith, MD as Consulting Physician (Radiation Oncology) Rockwell Germany, RN as Registered Nurse Mauro Kaufmann, RN as Registered Nurse Jake Shark, Johny Blamer, NP as Nurse Practitioner (Nurse Practitioner) [name], MD as Consulting Physician (Cardiology)  Extended Emergency Contact Information Primary Emergency Contact: [name],[name] Address: [address]          [place] [address] [place] of [place] Phone: [phone] Mobile Phone: [phone] Relation: Daughter Secondary Emergency Contact: [name],[name]  [place] Mobile Phone: [phone] Relation: None  Code Status:  DNR Goals of care: Advanced Directive information Advanced Directives 11/18/2017  Does Patient Have a Medical Advance Directive? Yes  Type of Advance Directive Out of facility DNR (pink MOST or yellow form)  Does patient want to make changes to medical advance directive? No - Patient declined  Copy of [place] in Chart? -  Pre-existing out of facility DNR order (yellow form or pink MOST form) Yellow form placed in chart (order not valid for inpatient  use);Pink MOST form placed in chart (order not valid for inpatient use)     Chief Complaint  Patient presents with  . Medical Management of Chronic Issues    F/u-GERD, dementia, CHF, CKD, anemia    HPI:  Pt is a 84 y.o. female seen today for medical management of chronic diseases.     The patient resides in SNF [facility] for safety and care assistance. Recently the patient has been treated for possible UTI(dysuria, urinary frequency/urency/incontinency,but UA C/S 11/13/17 indicated may be contaminated urine specimen) and leukocytosis/fever/lethargy with 7 day course of rocephin 1gm bid IM started 11/11/17, clinically improved the above symptoms. Hx of CHF, compensated clinically, on Torsemide 10mg  qd. Hx of HTN, blood pressure is controlled on Hydralazine 75mg  tid, Clonidine 0.1mg  bid, Carvedilol 25mg  bid. CKD, chronic, baseline creat 2s.    Past Medical History:  Diagnosis Date  . Arthritis    Osteoarthritis  . Cancer of left breast [facility]) August 2016  . Cerebral atherosclerosis 12/19/2012  . Cerebral atrophy ([place]) 12/19/2012  . Cerebral ischemia 12/19/2012   Microvascular   . CKD (chronic kidney disease) stage 3, GFR 30-59 ml/min (HCC) 11/03/2014  . Cough   . Dementia ([place])   . Diverticulosis of colon (without mention of hemorrhage)   . Edema   . Family history of breast cancer   . Flat feet   . GERD (gastroesophageal reflux disease)   . Glaucoma   . Heart murmur   . Hyperlipidemia   . Hypertension   . Hypertonicity of bladder   . Ischemic cardiomyopathy 11/04/2014  . Lumbago   . Memory change 03/28/2016  . Osteoarthrosis, unspecified whether generalized or localized, unspecified site   . Other malaise and fatigue   . Pain in joint, pelvic region and thigh   . Pain in joint,  shoulder region   . Personal history of fall   . RLQ abdominal pain 01/17/2011  . Senile osteoporosis   . Small bowel obstruction ([place])   . STEMI (ST elevation myocardial infarction) ([place]) 11/04/2014  .  Unstable gait 11/04/2014  . Urine, incontinence, stress female 09/18/2014   Past Surgical History:  Procedure Laterality Date  . BREAST LUMPECTOMY Bilateral   . BREAST LUMPECTOMY WITH RADIOACTIVE SEED LOCALIZATION Left 03/15/2015   Procedure: LEFT BREAST LUMPECTOMY WITH RADIOACTIVE SEED LOCALIZATION;  Surgeon: Fanny Skates, MD;  Location: [place];  Service: General;  Laterality: Left;  . CARDIAC CATHETERIZATION N/A 11/08/2014   Procedure: Left Heart Cath and Coronary Angiography;  Surgeon: [name], MD;  Location: [place] CV LAB;  Service: Cardiovascular;  Laterality: N/A;  . COLON SURGERY  02/1996   Colectomy, partial for diverticular bleed 90%  . EYE SURGERY    . JOINT REPLACEMENT Left 12/2000   Left knee; Dr. Wynelle Link  . LAPAROSCOPIC LYSIS OF ADHESIONS  11/15/10   Exploratory  . TONSILLECTOMY      Allergies  Allergen Reactions  . Sulfa Antibiotics Itching    Outpatient Encounter Medications as of 11/18/2017  Medication Sig  . acetaminophen (TYLENOL) 325 MG tablet Take 650 mg by mouth every 6 (six) hours as needed.   Marland Kitchen acetaminophen (TYLENOL) 325 MG tablet Take 650 mg by mouth 3 (three) times daily.  Marland Kitchen atorvastatin (LIPITOR) 10 MG tablet Take 10 mg by mouth daily.  . carboxymethylcellulose (REFRESH PLUS) 0.5 % SOLN Apply 2 drops to eye 2 (two) times daily.  . carvedilol (COREG) 25 MG tablet Take 25 mg by mouth 2 (two) times daily with a meal.  . cloNIDine (CATAPRES) 0.1 MG tablet Take 0.1 mg by mouth 2 (two) times daily.  . Coenzyme Q10 50 MG TABS Take one tablet by mouth at bedtime  . famotidine (PEPCID) 10 MG tablet Take 10 mg by mouth daily.  . ferrous sulfate 325 (65 FE) MG tablet Take 325 mg by mouth daily with breakfast.  . folic acid (FOLVITE) 1 MG tablet Take 1 mg by mouth daily.  . hydrALAZINE (APRESOLINE) 25 MG tablet Take 75 mg by mouth 3 (three) times daily.  . isosorbide mononitrate (IMDUR) 30 MG 24 hr tablet Take 90 mg by mouth daily. Hold for BP <110  .  KLOR-CON 10 10 MEQ tablet Take 10 mEq by mouth daily.  Marland Kitchen latanoprost (XALATAN) 0.005 % ophthalmic solution Place 1 drop into both eyes at bedtime.   . timolol (TIMOPTIC-XR) 0.5 % ophthalmic gel-forming Place 1 drop into both eyes daily.   Marland Kitchen torsemide (DEMADEX) 10 MG tablet Take 10 mg daily by mouth. Hold for SBP < 110  . vitamin B-12 (CYANOCOBALAMIN) 1000 MCG tablet Take 1,000 mcg See admin instructions by mouth. Take 1000 mcg by mouth daily On Mondays,Wednesdays and Fridays  . [DISCONTINUED] famotidine (PEPCID) 20 MG tablet Take 20 mg by mouth at bedtime.    No facility-administered encounter medications on file as of 11/18/2017.    ROS was provided with assistance of staff Review of Systems  Constitutional: Negative for activity change, appetite change, chills, diaphoresis, fatigue, fever and unexpected weight change.  HENT: Positive for hearing loss. Negative for congestion and voice change.   Respiratory: Negative for cough, shortness of breath and wheezing.   Gastrointestinal: Negative for abdominal distention, abdominal pain, constipation, diarrhea, nausea and vomiting.  Genitourinary: Negative for difficulty urinating, dysuria and urgency.  Musculoskeletal: Positive for arthralgias and gait problem.  Skin: Negative for color change and pallor.  Neurological: Negative for dizziness, speech difficulty, weakness and headaches.       Memory lapses.   Psychiatric/Behavioral: Negative for agitation, behavioral problems, hallucinations and sleep disturbance. The patient is not nervous/anxious.     Immunization History  Administered Date(s) Administered  . Influenza Whole 11/19/2011, 12/02/2012  . Influenza-Unspecified 12/06/2013, 12/06/2015, 12/02/2016  . PPD Test 11/10/2014  . Pneumococcal Conjugate-13 03/09/2009  . Pneumococcal Polysaccharide-23 02/18/2009  . Tdap 04/02/2016   Pertinent  Health Maintenance Due  Topic Date Due  . INFLUENZA VACCINE  09/18/2017  . MAMMOGRAM   11/26/2017  . DEXA SCAN  Completed  . PNA vac Low Risk Adult  Completed   Fall Risk  02/05/2017 01/31/2017 07/13/2015 11/17/2014 04/21/2014  Falls in the past year? No No No Yes Yes  Number falls in past yr: - - - 1 1  Injury with Fall? - - - No No  Comment - - - - -  Risk for fall due to : - History of fall(s);Impaired balance/gait;Impaired vision - Impaired balance/gait;History of fall(s) -   Functional Status Survey:    Vitals:   11/18/17 1032  BP: (!) 146/81  Pulse: 83  Resp: 18  Temp: 97.8 F (36.6 C)  SpO2: 97%  Weight: 147 lb (66.7 kg)  Height: 5\' 5"  (1.651 m)   Body mass index is 24.46 kg/m. Physical Exam  Constitutional: She appears well-developed and well-nourished.  HENT:  Head: Normocephalic and atraumatic.  Eyes: Pupils are equal, round, and reactive to light. EOM are normal.  Neck: Normal range of motion. Neck supple. No JVD present. No thyromegaly present.  Cardiovascular: Normal rate and regular rhythm.  Murmur heard. Pulmonary/Chest: Effort normal. She has no wheezes. She has rales.  Bibasilar rales.   Abdominal: Soft. She exhibits no distension. There is no tenderness. There is no rebound and no guarding.  Musculoskeletal: She exhibits edema.  Trace edema BLE  Neurological: She is alert. No cranial nerve deficit. She exhibits normal muscle tone. Coordination normal.  Oriented to person and place.   Skin: Skin is warm and dry.  Psychiatric: She has a normal mood and affect. Her behavior is normal.    Labs reviewed: Recent Labs    03/31/17 0836  06/30/17 0820 07/10/17 09/04/17 09/29/17 0836  NA 139   < > 140 140 139 141  K 4.0   < > 3.7 3.5 3.9 3.9  CL 106  --  112* 109 108 109  CO2 23  --  19* 8.9 22 21*  GLUCOSE 97  --  141*  --   --  105*  BUN 25   < > 26 24* 30* 28*  CREATININE 2.22*   < > 2.19* 2.1* 2.3* 2.14*  CALCIUM 9.3  --  8.8 8.9 8.5 9.3   < > = values in this interval not displayed.   Recent Labs    03/31/17 0836  06/30/17 0820  07/10/17 09/04/17 09/29/17 0836  AST 15   < > 12 13 11* 16  ALT <6   < > 9 5* 6* 9  ALKPHOS 53   < > 46 40 35 52  BILITOT 0.5  --  0.4  --   --  0.8  PROT 7.1  --  6.9  --  5.8 7.5  ALBUMIN 3.8  --  3.9 4.2 3.6 4.2   < > = values in this interval not displayed.   Recent Labs    03/31/17  8032  06/30/17 0820 07/01/17 09/23/17 09/29/17 0836  WBC 4.9   < > 6.0 6.0 5.4 7.7  NEUTROABS 3.3  --  4.1  --   --  5.8  HGB 10.9*   < > 10.6* 10.8* 9.3* 11.6  HCT 33.7*   < > 34.0* 33* 29* 37.1  MCV 90.3  --  91.2  --   --  94.2  PLT 190   < > 208 225 207 214   < > = values in this interval not displayed.   Lab Results  Component Value Date   TSH 2.21 04/04/2016   Lab Results  Component Value Date   HGBA1C 5.6 11/06/2014   Lab Results  Component Value Date   CHOL 113 11/04/2017   HDL 40 11/04/2017   LDLCALC 50 11/04/2017   TRIG 150 11/04/2017   CHOLHDL 3.1 11/06/2014    Significant Diagnostic Results in last 30 days:  No results found.  Assessment/Plan Hypertension Blood pressure is controlled, continue Carvedilol 25mg  bid, Clonidine 0.1mg  bid, Hydralazine 75mg  tid po.   Chronic diastolic congestive heart failure ([place]) Compensated clinically, continue Torsemide 10mg  qd, weights stable, trace edema BLE remains same. Observe.   Vascular dementia Continue SNF [facility] for safety and care assistance. Self propels w/c to get around.   CKD (chronic kidney disease) stage 4, GFR 15-29 ml/min (HCC) Baseline creat 2s, update CMP  Fever Resolved.   Leukocytosis Possible etiology UTI, will update CBC/diff     Family/ staff Communication: plan of care reviewed with the patient and charge nurse.   Labs/tests ordered:  CBC/diff, CMP  Time spend 25 minutes.

## 2017-11-18 NOTE — Assessment & Plan Note (Signed)
Baseline creat 2s, update CMP

## 2017-11-18 NOTE — Assessment & Plan Note (Signed)
Blood pressure is controlled, continue Carvedilol 25mg  bid, Clonidine 0.1mg  bid, Hydralazine 75mg  tid po.

## 2017-11-18 NOTE — Assessment & Plan Note (Signed)
Possible etiology UTI, will update CBC/diff

## 2017-11-18 NOTE — Assessment & Plan Note (Signed)
Continue [facility] for safety and care assistance. Self propels w/c to get around.

## 2017-11-18 NOTE — Assessment & Plan Note (Signed)
Compensated clinically, continue Torsemide 10mg  qd, weights stable, trace edema BLE remains same. Observe.

## 2017-11-18 NOTE — Assessment & Plan Note (Signed)
Resolved

## 2017-11-19 ENCOUNTER — Encounter: Payer: Self-pay | Admitting: Nurse Practitioner

## 2017-11-20 DIAGNOSIS — D72829 Elevated white blood cell count, unspecified: Secondary | ICD-10-CM | POA: Diagnosis not present

## 2017-11-20 LAB — BASIC METABOLIC PANEL
BUN: 30 — AB (ref 4–21)
Creatinine: 2.3 — AB (ref <=1.1)
Glucose: 91
POTASSIUM: 3.8 (ref 3.4–5.3)
SODIUM: 140 (ref 137–147)

## 2017-11-20 LAB — HEPATIC FUNCTION PANEL
ALK PHOS: 41 (ref 25–125)
ALT: 8 (ref 7–35)
AST: 16 (ref 13–35)
Bilirubin, Total: 0.3

## 2017-11-21 ENCOUNTER — Other Ambulatory Visit: Payer: Self-pay

## 2017-11-21 ENCOUNTER — Encounter (HOSPITAL_COMMUNITY): Payer: Self-pay | Admitting: Emergency Medicine

## 2017-11-21 ENCOUNTER — Emergency Department (HOSPITAL_COMMUNITY)
Admission: EM | Admit: 2017-11-21 | Discharge: 2017-11-21 | Disposition: A | Discharge status: Home / Self Care | Payer: Medicare Other | Attending: Emergency Medicine | Admitting: Emergency Medicine

## 2017-11-21 DIAGNOSIS — W050XXA Fall from non-moving wheelchair, initial encounter: Secondary | ICD-10-CM | POA: Insufficient documentation

## 2017-11-21 DIAGNOSIS — N183 Chronic kidney disease, stage 3 (moderate): Secondary | ICD-10-CM | POA: Insufficient documentation

## 2017-11-21 DIAGNOSIS — F0390 Unspecified dementia without behavioral disturbance: Secondary | ICD-10-CM | POA: Diagnosis not present

## 2017-11-21 DIAGNOSIS — M2550 Pain in unspecified joint: Secondary | ICD-10-CM | POA: Diagnosis not present

## 2017-11-21 DIAGNOSIS — Y9389 Activity, other specified: Secondary | ICD-10-CM | POA: Insufficient documentation

## 2017-11-21 DIAGNOSIS — Y9289 Other specified places as the place of occurrence of the external cause: Secondary | ICD-10-CM | POA: Diagnosis not present

## 2017-11-21 DIAGNOSIS — I10 Essential (primary) hypertension: Secondary | ICD-10-CM | POA: Diagnosis not present

## 2017-11-21 DIAGNOSIS — W19XXXA Unspecified fall, initial encounter: Secondary | ICD-10-CM

## 2017-11-21 DIAGNOSIS — S0121XA Laceration without foreign body of nose, initial encounter: Secondary | ICD-10-CM | POA: Diagnosis not present

## 2017-11-21 DIAGNOSIS — Y998 Other external cause status: Secondary | ICD-10-CM | POA: Diagnosis not present

## 2017-11-21 DIAGNOSIS — Z7401 Bed confinement status: Secondary | ICD-10-CM | POA: Diagnosis not present

## 2017-11-21 DIAGNOSIS — I129 Hypertensive chronic kidney disease with stage 1 through stage 4 chronic kidney disease, or unspecified chronic kidney disease: Secondary | ICD-10-CM | POA: Diagnosis not present

## 2017-11-21 DIAGNOSIS — R404 Transient alteration of awareness: Secondary | ICD-10-CM | POA: Diagnosis not present

## 2017-11-21 MED ORDER — LIDOCAINE-EPINEPHRINE-TETRACAINE (LET) SOLUTION
3.0000 mL | Freq: Once | NASAL | Status: AC
  Administered 2017-11-21: 3 mL via TOPICAL
  Filled 2017-11-21: qty 3

## 2017-11-21 NOTE — Discharge Instructions (Addendum)
You were evaluated in the Emergency Department and after careful evaluation, we did not find any emergent condition requiring admission or further testing in the hospital.  Your symptoms today seem to be due to laceration to the nose.  Please have your sutures removed by healthcare professional in 3 to 5 days.  Please return to the Emergency Department if you experience any worsening of your condition.  We encourage you to follow up with a primary care provider.  Thank you for allowing [place] to be a part of your care.

## 2017-11-21 NOTE — ED Provider Notes (Signed)
[facility] Emergency Department Provider Note MRN:  [id number]  Arrival date & time: 11/21/17     Chief Complaint   Fall   History of Present Illness   [name] is a 84 y.o. year-old female with a history of dementia, CKD, breast cancer presenting to the ED with chief complaint of fall.  History obtained largely from daughter. Patient was at her care facility, about 2 hours ago she was being transferred from wheelchair to bed when she stumbled forward, striking her head against the floor.  No loss of consciousness, no blood thinners.  Sustained lacerations to her nose and left forehead.  Denies neck pain, no chest pain or shortness of breath, no abdominal pain, no pain to the extremities.  I was unable to obtain an accurate HPI, PMH, or ROS due to the patient's dementia.  Review of Systems  Positive for fall, head trauma.  Patient's Health History    Past Medical History:  Diagnosis Date  . Arthritis    Osteoarthritis  . Cancer of left breast [facility]) August 2016  . Cerebral atherosclerosis 12/19/2012  . Cerebral atrophy ([place]) 12/19/2012  . Cerebral ischemia 12/19/2012   Microvascular   . CKD (chronic kidney disease) stage 3, GFR 30-59 ml/min (HCC) 11/03/2014  . Cough   . Dementia ([place])   . Diverticulosis of colon (without mention of hemorrhage)   . Edema   . Family history of breast cancer   . Flat feet   . GERD (gastroesophageal reflux disease)   . Glaucoma   . Heart murmur   . Hyperlipidemia   . Hypertension   . Hypertonicity of bladder   . Ischemic cardiomyopathy 11/04/2014  . Lumbago   . Memory change 03/28/2016  . Osteoarthrosis, unspecified whether generalized or localized, unspecified site   . Other malaise and fatigue   . Pain in joint, pelvic region and thigh   . Pain in joint, shoulder region   . Personal history of fall   . RLQ abdominal pain 01/17/2011  . Senile osteoporosis   . Small bowel obstruction ([place])   . STEMI (ST elevation  myocardial infarction) ([place]) 11/04/2014  . Unstable gait 11/04/2014  . Urine, incontinence, stress female 09/18/2014    Past Surgical History:  Procedure Laterality Date  . BREAST LUMPECTOMY Bilateral   . BREAST LUMPECTOMY WITH RADIOACTIVE SEED LOCALIZATION Left 03/15/2015   Procedure: LEFT BREAST LUMPECTOMY WITH RADIOACTIVE SEED LOCALIZATION;  Surgeon: Fanny Skates, MD;  Location: [place];  Service: General;  Laterality: Left;  . CARDIAC CATHETERIZATION N/A 11/08/2014   Procedure: Left Heart Cath and Coronary Angiography;  Surgeon: [name], MD;  Location: [place] [facility];  Service: Cardiovascular;  Laterality: N/A;  . COLON SURGERY  02/1996   Colectomy, partial for diverticular bleed 90%  . EYE SURGERY    . JOINT REPLACEMENT Left 12/2000   Left knee; Dr. Wynelle Link  . LAPAROSCOPIC LYSIS OF ADHESIONS  11/15/10   Exploratory  . TONSILLECTOMY      Family History  Problem Relation Age of Onset  . Cancer Father 101       colon  . Cancer Maternal Aunt        breast cancer   . Cancer Cousin        breast cancer     Social History   Socioeconomic History  . Marital status: Widowed    Spouse name: Not on file  . Number of children: Not on file  . Years of education:  Not on file  . Highest education level: Not on file  Occupational History  . Not on file  Social Needs  . Financial resource strain: Patient refused  . Food insecurity:    Worry: Patient refused    Inability: Patient refused  . Transportation needs:    Medical: Patient refused    Non-medical: Patient refused  Tobacco Use  . Smoking status: Former Smoker    Packs/day: 0.50    Years: 30.00    Pack years: 15.00    Types: Cigarettes    Last attempt to quit: 12/16/1980    Years since quitting: 36.9  . Smokeless tobacco: Never Used  Substance and Sexual Activity  . Alcohol use: Yes    Alcohol/week: 1.0 standard drinks    Types: 1 Glasses of wine per week    Comment: occasional glass of wine  . Drug use: No    . Sexual activity: Never  Lifestyle  . Physical activity:    Days per week: Patient refused    Minutes per session: Patient refused  . Stress: Patient refused  Relationships  . Social connections:    Talks on phone: Patient refused    Gets together: Patient refused    Attends religious service: Patient refused    Active member of club or organization: Patient refused    Attends meetings of clubs or organizations: Patient refused    Relationship status: Widowed  . Intimate partner violence:    Fear of current or ex partner: Patient refused    Emotionally abused: Patient refused    Physically abused: Patient refused    Forced sexual activity: Patient refused  Other Topics Concern  . Not on file  Social History Narrative   Lives at [facility] 05/2011   Widowed 2003   Living Will   [place] with cane   Never smoked    Exercise none      Physical Exam  Vital Signs and Nursing Notes reviewed Vitals:   11/21/17 1800 11/21/17 1830  BP: (!) 198/86 (!) 188/89  Pulse: 79 77  Resp:    SpO2: 97% 94%    CONSTITUTIONAL: Well-appearing, NAD NEURO: Oriented to name, moving all extremities, no focal deficits EYES:  eyes equal and reactive ENT/NECK:  no LAD, no JVD CARDIO: Regular rate, well-perfused, normal S1 and S2 PULM:  CTAB no wheezing or rhonchi GI/GU:  normal bowel sounds, non-distended, non-tender MSK/SPINE:  No gross deformities, no edema SKIN:  no rash, irregular laceration to the nose, abrasion to the left forehead PSYCH:  Appropriate speech and behavior  Diagnostic and Interventional Summary    EKG Interpretation  Date/Time:    Ventricular Rate:    PR Interval:    QRS Duration:   QT Interval:    QTC Calculation:   R Axis:     Text Interpretation:        Labs Reviewed - No data to display  No orders to display    Medications  lidocaine-EPINEPHrine-tetracaine (LET) solution (3 mLs Topical Given 11/21/17 1823)     .Marland KitchenLaceration Repair Date/Time:  11/21/2017 6:59 PM Performed by: Maudie Flakes, MD Authorized by: Maudie Flakes, MD   Consent:    Consent obtained:  Verbal   Consent given by:  Guardian   Risks discussed:  Poor cosmetic result Anesthesia (see MAR for exact dosages):    Anesthesia method:  Topical application   Topical anesthetic:  LET Laceration details:    Location:  Face   Face location:  Nose   Length (cm):  2   Depth (mm):  2 Repair type:    Repair type:  Simple Exploration:    Hemostasis achieved with:  Direct pressure   Wound exploration: entire depth of wound probed and visualized     Contaminated: no   Treatment:    Area cleansed with:  Saline   Amount of cleaning:  Standard   Irrigation solution:  Sterile saline   Irrigation method:  Syringe   Visualized foreign bodies/material removed: no   Skin repair:    Repair method:  Sutures   Suture size:  5-0   Suture material:  Prolene   Suture technique:  Simple interrupted   Number of sutures:  4 Approximation:    Approximation:  Close Post-procedure details:    Dressing:  Sterile dressing   Patient tolerance of procedure:  Tolerated well, no immediate complications   Critical Care  ED Course and Medical Decision Making  I have reviewed the triage vital signs and the nursing notes.  Pertinent labs & imaging results that were available during my care of the patient were reviewed by me and considered in my medical decision making (see below for details).  Mild head trauma in this 84 year old female, no anticoagulant use.  Patient is DNR, discussed management options with daughter, who explains that if we were to do CT imaging, they would not be acting on any significant findings, not interested in neurosurgical procedures.  Therefore, will observe her time in the ED, defer imaging at this time.   Laceration repaired as described above.  Spoke again with daughter who agrees with the plan to defer imaging today, will be monitored closely at her  facility.  After the discussed management above, the patient was determined to be safe for discharge.  The patient was in agreement with this plan and all questions regarding their care were answered.  ED return precautions were discussed and the patient will return to the ED with any significant worsening of condition.  Barth Kirks. Sedonia Small, [place] [email or web address]  Final Clinical Impressions(s) / ED Diagnoses     ICD-10-CM   1. Fall, initial encounter W19.XXXA   2. Laceration of nose, initial encounter [email or web address]     ED Discharge Orders    None         Maudie Flakes, MD 11/21/17 1910

## 2017-11-21 NOTE — ED Triage Notes (Signed)
Patient arrived by EMS from [facility]. Pt has hx of falls, pts gets weak in the knees and falls per EMS. Circular skin avulsion above lft eyebrow and skin tear on bridge of nose caused by eye glasses per EMS. 168 palpated, HR 76, RR 18. Hx of dementia. Alert and oriented per baseline.

## 2017-11-21 NOTE — ED Notes (Signed)
[facility] called  

## 2017-11-21 NOTE — ED Notes (Signed)
ED Provider at bedside. 

## 2017-11-24 ENCOUNTER — Other Ambulatory Visit: Payer: Self-pay | Admitting: *Deleted

## 2017-11-24 ENCOUNTER — Non-Acute Institutional Stay (SKILLED_NURSING_FACILITY): Payer: Medicare Other | Admitting: Nurse Practitioner

## 2017-11-24 DIAGNOSIS — S0121XA Laceration without foreign body of nose, initial encounter: Secondary | ICD-10-CM

## 2017-11-24 DIAGNOSIS — N184 Chronic kidney disease, stage 4 (severe): Secondary | ICD-10-CM

## 2017-11-24 DIAGNOSIS — I10 Essential (primary) hypertension: Secondary | ICD-10-CM

## 2017-11-24 DIAGNOSIS — F0150 Vascular dementia without behavioral disturbance: Secondary | ICD-10-CM | POA: Diagnosis not present

## 2017-11-24 DIAGNOSIS — S0083XA Contusion of other part of head, initial encounter: Secondary | ICD-10-CM | POA: Insufficient documentation

## 2017-11-24 DIAGNOSIS — D638 Anemia in other chronic diseases classified elsewhere: Secondary | ICD-10-CM

## 2017-11-24 DIAGNOSIS — S0083XD Contusion of other part of head, subsequent encounter: Secondary | ICD-10-CM | POA: Diagnosis not present

## 2017-11-24 LAB — COMPLETE METABOLIC PANEL WITH GFR
ALBUMIN: 3.4
CHLORIDE: 108
Calcium: 8.4
Carbon Dioxide, Total: 22
EGFR (Non-African Amer.): 18
Globulin: 3.4
TOTAL PROTEIN: 5.7 g/dL

## 2017-11-24 NOTE — Progress Notes (Signed)
Location:  [place] Room Number: 5 Place of Service:  SNF (31) Provider:  Kinslee Dalpe, Lennie Odor  NP  Elbert Polyakov X, NP  Patient Care Team: Adaly Puder X, NP as PCP - General (Internal Medicine) Juanita Craver, MD as Consulting Physician (Gastroenterology) Shon Hough, MD as Consulting Physician (Ophthalmology) Thornell Sartorius, MD as Consulting Physician (Otolaryngology) [place] Karem Tomaso X, NP as Nurse Practitioner (Nurse Practitioner) Bo Merino, MD as Consulting Physician (Rheumatology) Suella Broad, MD as Consulting Physician (Physical Medicine and Rehabilitation) Netta Cedars, MD as Consulting Physician (Orthopedic Surgery) Fanny Skates, MD as Consulting Physician (General Surgery) Truitt Merle, MD as Consulting Physician (Hematology) Thea Silversmith, MD as Consulting Physician (Radiation Oncology) Rockwell Germany, RN as Registered Nurse Mauro Kaufmann, RN as Registered Nurse Jake Shark, Johny Blamer, NP as Nurse Practitioner (Nurse Practitioner) [name], MD as Consulting Physician (Cardiology)  Extended Emergency Contact Information Primary Emergency Contact: [name],[name] Address: [address]          [place] [address] [place] Phone: [phone] Mobile Phone: [phone] Relation: Daughter Secondary Emergency Contact: [name],[name]  [place] Mobile Phone: [phone] Relation: None  Code Status:  DNR Goals of care: Advanced Directive information Advanced Directives 11/21/2017  Does Patient Have a Medical Advance Directive? Yes  Type of Advance Directive Living will;Out of facility DNR (pink MOST or yellow form)  Does patient want to make changes to medical advance directive? -  Copy of [place] in Chart? -  Pre-existing out of facility DNR order (yellow form or pink MOST form) -     Chief Complaint  Patient presents with  . Acute Visit      Fall- laceration to nose and hematoma to (L) forehead.    HPI:  Pt is a 84 y.o. female seen today for an acute visit for laceration/contusion @ nose and left forehead sustained 11/21/17 when the patient was transferred from chair to wheelchair, lost balance and hit her face on TV stand. ED evaluation, sutures noted at bridge of nose. HPI was provided with assistance of staff, she resides in [facility] for safety and care assistance, she uses w/c for mobility. Hx of anemia, on Fe daily, last Hgb 11.6 09/29/17. Hx of HTN, blood pressure, Sbp in 150s sometimes, she denied chest pain/pressure, palpitation, dizziness, or chang of vision, on Isosorbide 90mg  qd, Hydralazine 75mg  tid, Clonidine 0.1mg  bid, Carvedilol 25mg  bid.    Past Medical History:  Diagnosis Date  . Arthritis    Osteoarthritis  . Cancer of left breast [facility]) August 2016  . Cerebral atherosclerosis 12/19/2012  . Cerebral atrophy ([place]) 12/19/2012  . Cerebral ischemia 12/19/2012   Microvascular   . CKD (chronic kidney disease) stage 3, GFR 30-59 ml/min (HCC) 11/03/2014  . Cough   . Dementia ([place])   . Diverticulosis of colon (without mention of hemorrhage)   . Edema   . Family history of breast cancer   . Flat feet   . GERD (gastroesophageal reflux disease)   . Glaucoma   . Heart murmur   . Hyperlipidemia   . Hypertension   . Hypertonicity of bladder   . Ischemic cardiomyopathy 11/04/2014  . Lumbago   . Memory change 03/28/2016  . Osteoarthrosis, unspecified whether generalized or localized, unspecified site   . Other malaise and fatigue   . Pain in joint, pelvic region and thigh   . Pain in joint, shoulder region   . Personal history of  fall   . RLQ abdominal pain 01/17/2011  . Senile osteoporosis   . Small bowel obstruction ([place])   . STEMI (ST elevation myocardial infarction) ([place]) 11/04/2014  . Unstable gait 11/04/2014  . Urine, incontinence, stress female 09/18/2014   Past Surgical History:  Procedure Laterality Date   . BREAST LUMPECTOMY Bilateral   . BREAST LUMPECTOMY WITH RADIOACTIVE SEED LOCALIZATION Left 03/15/2015   Procedure: LEFT BREAST LUMPECTOMY WITH RADIOACTIVE SEED LOCALIZATION;  Surgeon: Fanny Skates, MD;  Location: [place];  Service: General;  Laterality: Left;  . CARDIAC CATHETERIZATION N/A 11/08/2014   Procedure: Left Heart Cath and Coronary Angiography;  Surgeon: [name], MD;  Location: [place] [facility];  Service: Cardiovascular;  Laterality: N/A;  . COLON SURGERY  02/1996   Colectomy, partial for diverticular bleed 90%  . EYE SURGERY    . JOINT REPLACEMENT Left 12/2000   Left knee; Dr. Wynelle Link  . LAPAROSCOPIC LYSIS OF ADHESIONS  11/15/10   Exploratory  . TONSILLECTOMY      Allergies  Allergen Reactions  . Sulfa Antibiotics Itching    Outpatient Encounter Medications as of 11/24/2017  Medication Sig  . acetaminophen (TYLENOL) 325 MG tablet Take 2 tablets (650 mg) TID & Take 2 tablets (650 mg) every 6 hours as needed for pain  . atorvastatin (LIPITOR) 10 MG tablet Take 10 mg by mouth daily.  . carboxymethylcellulose (REFRESH PLUS) 0.5 % SOLN Apply 2 drops to eye 2 (two) times daily.  . carvedilol (COREG) 25 MG tablet Take 25 mg by mouth 2 (two) times daily with a meal.  . cloNIDine (CATAPRES) 0.1 MG tablet Take 0.1 mg by mouth 2 (two) times daily.  . Coenzyme Q10 50 MG TABS Take one tablet by mouth at bedtime  . famotidine (PEPCID) 10 MG tablet Take 10 mg by mouth daily.  . ferrous sulfate 325 (65 FE) MG tablet Take 325 mg by mouth daily with breakfast.  . folic acid (FOLVITE) 1 MG tablet Take 1 mg by mouth daily.  . hydrALAZINE (APRESOLINE) 25 MG tablet Take 75 mg by mouth 3 (three) times daily.  . isosorbide mononitrate (IMDUR) 60 MG 24 hr tablet Take 90 mg by mouth daily.  Marland Kitchen latanoprost (XALATAN) 0.005 % ophthalmic solution Place 1 drop into both eyes at bedtime.   . potassium chloride (K-DUR,KLOR-CON) 10 MEQ tablet Take 10 mEq by mouth daily.   . timolol (TIMOPTIC-XR) 0.5  % ophthalmic gel-forming Place 1 drop into both eyes daily.   Marland Kitchen torsemide (DEMADEX) 10 MG tablet Take 10 mg daily by mouth. Hold for SBP < 110  . vitamin B-12 (CYANOCOBALAMIN) 1000 MCG tablet Take 1,000 mcg See admin instructions by mouth. Take 1000 mcg by mouth daily On Mondays,Wednesdays and Fridays   No facility-administered encounter medications on file as of 11/24/2017.    ROS was provided with assistance of staff Review of Systems  Constitutional: Negative for activity change, appetite change, chills, diaphoresis, fatigue and fever.  HENT: Positive for hearing loss. Negative for congestion and voice change.   Respiratory: Positive for shortness of breath. Negative for cough and wheezing.        Chronic DOE  Cardiovascular: Positive for leg swelling. Negative for chest pain and palpitations.  Gastrointestinal: Negative for abdominal distention, abdominal pain, constipation, diarrhea, nausea and vomiting.  Genitourinary: Negative for difficulty urinating, dysuria and urgency.  Musculoskeletal: Positive for arthralgias and gait problem. Negative for joint swelling.  Skin: Positive for color change and wound.  Neurological: Negative for dizziness,  tremors, facial asymmetry, speech difficulty, weakness and headaches.       Dementia  Hematological: Bruises/bleeds easily.  Psychiatric/Behavioral: Positive for confusion. Negative for agitation, behavioral problems, hallucinations and sleep disturbance. The patient is not nervous/anxious.     Immunization History  Administered Date(s) Administered  . Influenza Whole 11/19/2011, 12/02/2012  . Influenza-Unspecified 12/06/2013, 12/06/2015, 12/02/2016  . PPD Test 11/10/2014  . Pneumococcal Conjugate-13 03/09/2009  . Pneumococcal Polysaccharide-23 02/18/2009  . Tdap 04/02/2016   Pertinent  Health Maintenance Due  Topic Date Due  . INFLUENZA VACCINE  09/18/2017  . MAMMOGRAM  11/26/2017  . DEXA SCAN  Completed  . PNA vac Low Risk Adult   Completed   Fall Risk  02/05/2017 01/31/2017 07/13/2015 11/17/2014 04/21/2014  Falls in the past year? No No No Yes Yes  Number falls in past yr: - - - 1 1  Injury with Fall? - - - No No  Comment - - - - -  Risk for fall due to : - History of fall(s);Impaired balance/gait;Impaired vision - Impaired balance/gait;History of fall(s) -   Functional Status Survey:    Vitals:   11/24/17 1235  BP: (!) 158/84  Pulse: 82  Resp: 20  Temp: 97.6 F (36.4 C)  SpO2: 94%  Weight: 142 lb 12.8 oz (64.8 kg)  Height: 5\' 5"  (1.651 m)   Body mass index is 23.76 kg/m. Physical Exam  Constitutional: She appears well-developed and well-nourished.  HENT:  Head: Normocephalic and atraumatic.  Eyes: Pupils are equal, round, and reactive to light. Conjunctivae and EOM are normal.  Neck: Normal range of motion. Neck supple. No JVD present. No thyromegaly present.  Cardiovascular: Normal rate and regular rhythm.  Murmur heard. Pulmonary/Chest: Effort normal. She has no wheezes. She has no rales.  Abdominal: Soft. She exhibits no distension. There is no tenderness. There is no guarding.  Musculoskeletal: She exhibits edema.  Trace edema  Neurological: She is alert. No cranial nerve deficit. She exhibits normal muscle tone. Coordination normal.  Oriented to person and her room on unit.   Skin: Skin is warm and dry.  Left forehead hematoma about a golf ball size. Sutures at the bridge of nose intact, no s/s of bleeding or infection. Ecchymoses under eyes, nose, left forehead.   Psychiatric: She has a normal mood and affect. Her behavior is normal.    Labs reviewed: Recent Labs    03/31/17 0836  06/30/17 0820  09/04/17 09/29/17 0836 11/20/17  NA 139   < > 140   < > 139 141 140  K 4.0   < > 3.7   < > 3.9 3.9 3.8  CL 106  --  112*   < > 108 109 108  CO2 23  --  19*   < > 22 21* 22  GLUCOSE 97  --  141*  --   --  105*  --   BUN 25   < > 26   < > 30* 28* 30*  CREATININE 2.22*   < > 2.19*   < > 2.3*  2.14* 2.3*  CALCIUM 9.3  --  8.8   < > 8.5 9.3 8.4   < > = values in this interval not displayed.   Recent Labs    03/31/17 0836  06/30/17 0820  09/04/17 09/29/17 0836 11/20/17  AST 15   < > 12   < > 11* 16 16  ALT <6   < > 9   < > 6* 9 8  ALKPHOS 53   < > 46   < > 35 52 41  BILITOT 0.5  --  0.4  --   --  0.8  --   PROT 7.1  --  6.9  --  5.8 7.5 5.7  ALBUMIN 3.8  --  3.9   < > 3.6 4.2 3.4   < > = values in this interval not displayed.   Recent Labs    03/31/17 0836  06/30/17 0820 07/01/17 09/23/17 09/29/17 0836  WBC 4.9   < > 6.0 6.0 5.4 7.7  NEUTROABS 3.3  --  4.1  --   --  5.8  HGB 10.9*   < > 10.6* 10.8* 9.3* 11.6  HCT 33.7*   < > 34.0* 33* 29* 37.1  MCV 90.3  --  91.2  --   --  94.2  PLT 190   < > 208 225 207 214   < > = values in this interval not displayed.   Lab Results  Component Value Date   TSH 2.21 04/04/2016   Lab Results  Component Value Date   HGBA1C 5.6 11/06/2014   Lab Results  Component Value Date   CHOL 113 11/04/2017   HDL 40 11/04/2017   LDLCALC 50 11/04/2017   TRIG 150 11/04/2017   CHOLHDL 3.1 11/06/2014    Significant Diagnostic Results in last 30 days:  No results found.  Assessment/Plan Traumatic hematoma of forehead Left forehead, no s/s of infection, will update CBC  Simple laceration of nose No s/s of infection. Remove sutures 3-5 days.   Vascular dementia Continue close supervision and assistance for safety.   Anemia of chronic disease Hx of chronic IDA/chronic disease-CKD anemia, continue Fe supplement. Update CBC in setting of traumatic contusion/laceration/hematoma.      Family/ staff Communication: plan of care reviewed with the patient and charge nurse.   Labs/tests ordered:  CBC  Time spend 25 minutes.

## 2017-11-24 NOTE — Assessment & Plan Note (Signed)
Continue close supervision and assistance for safety.

## 2017-11-24 NOTE — Assessment & Plan Note (Signed)
Hx of chronic IDA/chronic disease-CKD anemia, continue Fe supplement. Update CBC in setting of traumatic contusion/laceration/hematoma.

## 2017-11-24 NOTE — Assessment & Plan Note (Signed)
Left forehead, no s/s of infection, will update CBC

## 2017-11-24 NOTE — Assessment & Plan Note (Signed)
No s/s of infection. Remove sutures 3-5 days.

## 2017-11-25 ENCOUNTER — Encounter: Payer: Self-pay | Admitting: Nurse Practitioner

## 2017-11-25 DIAGNOSIS — S0083XA Contusion of other part of head, initial encounter: Secondary | ICD-10-CM | POA: Diagnosis not present

## 2017-11-25 LAB — CBC AND DIFFERENTIAL
HEMATOCRIT: 26 — AB (ref 36–46)
HEMOGLOBIN: 8.5 — AB (ref 12.0–16.0)
Platelets: 192 (ref 150–399)
WBC: 5.6

## 2017-11-25 NOTE — Assessment & Plan Note (Signed)
blood pressure, Sbp in 150s sometimes, she denied chest pain/pressure, palpitation, dizziness, or chang of vision, continue sosorbide 90mg  qd, Hydralazine 75mg  tid, Clonidine 0.1mg  bid, Carvedilol 25mg  bid.

## 2017-11-26 ENCOUNTER — Other Ambulatory Visit: Payer: Self-pay | Admitting: *Deleted

## 2017-12-02 DIAGNOSIS — S0083XA Contusion of other part of head, initial encounter: Secondary | ICD-10-CM | POA: Diagnosis not present

## 2017-12-02 LAB — CBC AND DIFFERENTIAL
HCT: 26 — AB (ref 36–46)
Hemoglobin: 8.5 — AB (ref 12.0–16.0)
PLATELETS: 167 (ref 150–399)
WBC: 4.6

## 2017-12-03 ENCOUNTER — Other Ambulatory Visit: Payer: Self-pay | Admitting: *Deleted

## 2017-12-12 DIAGNOSIS — N183 Chronic kidney disease, stage 3 (moderate): Secondary | ICD-10-CM | POA: Diagnosis not present

## 2017-12-12 DIAGNOSIS — R296 Repeated falls: Secondary | ICD-10-CM | POA: Diagnosis not present

## 2017-12-12 DIAGNOSIS — I5021 Acute systolic (congestive) heart failure: Secondary | ICD-10-CM | POA: Diagnosis not present

## 2017-12-12 DIAGNOSIS — R609 Edema, unspecified: Secondary | ICD-10-CM | POA: Diagnosis not present

## 2017-12-12 DIAGNOSIS — M25561 Pain in right knee: Secondary | ICD-10-CM | POA: Diagnosis not present

## 2017-12-12 DIAGNOSIS — I10 Essential (primary) hypertension: Secondary | ICD-10-CM | POA: Diagnosis not present

## 2017-12-12 DIAGNOSIS — M6281 Muscle weakness (generalized): Secondary | ICD-10-CM | POA: Diagnosis not present

## 2017-12-12 DIAGNOSIS — N184 Chronic kidney disease, stage 4 (severe): Secondary | ICD-10-CM | POA: Diagnosis not present

## 2017-12-12 DIAGNOSIS — R278 Other lack of coordination: Secondary | ICD-10-CM | POA: Diagnosis not present

## 2017-12-18 DIAGNOSIS — M6281 Muscle weakness (generalized): Secondary | ICD-10-CM | POA: Diagnosis not present

## 2017-12-18 DIAGNOSIS — N183 Chronic kidney disease, stage 3 (moderate): Secondary | ICD-10-CM | POA: Diagnosis not present

## 2017-12-18 DIAGNOSIS — N184 Chronic kidney disease, stage 4 (severe): Secondary | ICD-10-CM | POA: Diagnosis not present

## 2017-12-18 DIAGNOSIS — I5021 Acute systolic (congestive) heart failure: Secondary | ICD-10-CM | POA: Diagnosis not present

## 2017-12-18 DIAGNOSIS — M25561 Pain in right knee: Secondary | ICD-10-CM | POA: Diagnosis not present

## 2017-12-18 DIAGNOSIS — I10 Essential (primary) hypertension: Secondary | ICD-10-CM | POA: Diagnosis not present

## 2017-12-18 DIAGNOSIS — R296 Repeated falls: Secondary | ICD-10-CM | POA: Diagnosis not present

## 2017-12-18 DIAGNOSIS — R278 Other lack of coordination: Secondary | ICD-10-CM | POA: Diagnosis not present

## 2017-12-18 DIAGNOSIS — R609 Edema, unspecified: Secondary | ICD-10-CM | POA: Diagnosis not present

## 2017-12-19 ENCOUNTER — Non-Acute Institutional Stay (SKILLED_NURSING_FACILITY): Payer: Medicare Other | Admitting: Nurse Practitioner

## 2017-12-19 ENCOUNTER — Encounter: Payer: Self-pay | Admitting: Nurse Practitioner

## 2017-12-19 DIAGNOSIS — M179 Osteoarthritis of knee, unspecified: Secondary | ICD-10-CM | POA: Diagnosis not present

## 2017-12-19 DIAGNOSIS — N183 Chronic kidney disease, stage 3 (moderate): Secondary | ICD-10-CM | POA: Diagnosis not present

## 2017-12-19 DIAGNOSIS — M6281 Muscle weakness (generalized): Secondary | ICD-10-CM | POA: Diagnosis not present

## 2017-12-19 DIAGNOSIS — D638 Anemia in other chronic diseases classified elsewhere: Secondary | ICD-10-CM

## 2017-12-19 DIAGNOSIS — I10 Essential (primary) hypertension: Secondary | ICD-10-CM | POA: Diagnosis not present

## 2017-12-19 DIAGNOSIS — K219 Gastro-esophageal reflux disease without esophagitis: Secondary | ICD-10-CM | POA: Diagnosis not present

## 2017-12-19 DIAGNOSIS — I5032 Chronic diastolic (congestive) heart failure: Secondary | ICD-10-CM | POA: Diagnosis not present

## 2017-12-19 DIAGNOSIS — M159 Polyosteoarthritis, unspecified: Secondary | ICD-10-CM | POA: Diagnosis not present

## 2017-12-19 DIAGNOSIS — I110 Hypertensive heart disease with heart failure: Secondary | ICD-10-CM

## 2017-12-19 DIAGNOSIS — R609 Edema, unspecified: Secondary | ICD-10-CM | POA: Diagnosis not present

## 2017-12-19 DIAGNOSIS — M25561 Pain in right knee: Secondary | ICD-10-CM | POA: Diagnosis not present

## 2017-12-19 DIAGNOSIS — I214 Non-ST elevation (NSTEMI) myocardial infarction: Secondary | ICD-10-CM | POA: Diagnosis not present

## 2017-12-19 DIAGNOSIS — R296 Repeated falls: Secondary | ICD-10-CM | POA: Diagnosis not present

## 2017-12-19 DIAGNOSIS — M810 Age-related osteoporosis without current pathological fracture: Secondary | ICD-10-CM | POA: Diagnosis not present

## 2017-12-19 DIAGNOSIS — M21061 Valgus deformity, not elsewhere classified, right knee: Secondary | ICD-10-CM | POA: Diagnosis not present

## 2017-12-19 DIAGNOSIS — R293 Abnormal posture: Secondary | ICD-10-CM | POA: Diagnosis not present

## 2017-12-19 DIAGNOSIS — R2681 Unsteadiness on feet: Secondary | ICD-10-CM | POA: Diagnosis not present

## 2017-12-19 DIAGNOSIS — C50412 Malignant neoplasm of upper-outer quadrant of left female breast: Secondary | ICD-10-CM | POA: Diagnosis not present

## 2017-12-19 DIAGNOSIS — I350 Nonrheumatic aortic (valve) stenosis: Secondary | ICD-10-CM | POA: Diagnosis not present

## 2017-12-19 DIAGNOSIS — I5022 Chronic systolic (congestive) heart failure: Secondary | ICD-10-CM

## 2017-12-19 DIAGNOSIS — F0390 Unspecified dementia without behavioral disturbance: Secondary | ICD-10-CM | POA: Diagnosis not present

## 2017-12-19 NOTE — Assessment & Plan Note (Signed)
OA pian, mostly left knee when bearing weight, continue Tylenol 650mg  tid and wheelchair

## 2017-12-19 NOTE — Assessment & Plan Note (Signed)
HTN, blood pressure is controlled, continue Carvedilol 25mg  bid, Clonidine 0.1mg  bid, Hydralazine 75mg  tid. CHF, compensated, continue Furosemide 10mg  qd. CAD, stable, no angina since last visited, continue isosorbide 90mg  qd.

## 2017-12-19 NOTE — Assessment & Plan Note (Signed)
Anemia, persists,  last Hgb 8.5 12/02/17,  Continue  Vit C91, Folic acid, Fe. Pending CBC, no s/s of bleeding.

## 2017-12-19 NOTE — Progress Notes (Signed)
Location:  [place] Room Number: 5 Place of Service:  SNF (31) Provider:  Mast, Lennie Odor  NP  Mast, Man X, NP  Patient Care Team: Mast, Man X, NP as PCP - General (Internal Medicine) Juanita Craver, MD as Consulting Physician (Gastroenterology) Shon Hough, MD as Consulting Physician (Ophthalmology) Thornell Sartorius, MD as Consulting Physician (Otolaryngology) [place] Mast, Man X, NP as Nurse Practitioner (Nurse Practitioner) Bo Merino, MD as Consulting Physician (Rheumatology) Suella Broad, MD as Consulting Physician (Physical Medicine and Rehabilitation) Netta Cedars, MD as Consulting Physician (Orthopedic Surgery) Fanny Skates, MD as Consulting Physician (General Surgery) Truitt Merle, MD as Consulting Physician (Hematology) Thea Silversmith, MD as Consulting Physician (Radiation Oncology) Rockwell Germany, RN as Registered Nurse Mauro Kaufmann, RN as Registered Nurse Jake Shark, Johny Blamer, NP as Nurse Practitioner (Nurse Practitioner) [name], MD as Consulting Physician (Cardiology)  Extended Emergency Contact Information Primary Emergency Contact: [name],[name] Address: [address]          [place] [address] [place] Phone: [phone] Mobile Phone: [phone] Relation: Daughter Secondary Emergency Contact: [name],[name]  [place] Mobile Phone: [phone] Relation: None  Code Status:  DNR Goals of care: Advanced Directive information Advanced Directives 12/19/2017  Does Patient Have a Medical Advance Directive? Yes  Type of Advance Directive Out of facility DNR (pink MOST or yellow form);Living will  Does patient want to make changes to medical advance directive? No - Patient declined  Copy of [place] in Chart? -  Pre-existing out of facility DNR order (yellow form or pink MOST form) -     Chief Complaint  Patient presents  with  . Medical Management of Chronic Issues    F/u- HTN, CKD, CHF, dementia, anemia, leukocytosis    HPI:  Pt is a 84 y.o. female seen today for medical management of chronic diseases.     The patient has history of HTN, blood pressure is controlled on Carvedilol 25mg  bid, Clonidine 0.1mg  bid, Hydralazine 75mg  tid. CHF, compensated, on Furosemide 10mg  qd. CAD, stable, no angina since last visited, on isosorbide 90mg  qd. Anemia, stable, last Hgb on Vit E99, Folic acid, Fe. GERD stable on Famotidine 10mg  qd. OA pian, mostly left knee, on Tylenol 650mg  tid and wheelchair.  Past Medical History:  Diagnosis Date  . Arthritis    Osteoarthritis  . Cancer of left breast [facility]) August 2016  . Cerebral atherosclerosis 12/19/2012  . Cerebral atrophy ([place]) 12/19/2012  . Cerebral ischemia 12/19/2012   Microvascular   . CKD (chronic kidney disease) stage 3, GFR 30-59 ml/min (HCC) 11/03/2014  . Cough   . Dementia ([place])   . Diverticulosis of colon (without mention of hemorrhage)   . Edema   . Family history of breast cancer   . Flat feet   . GERD (gastroesophageal reflux disease)   . Glaucoma   . Heart murmur   . Hyperlipidemia   . Hypertension   . Hypertonicity of bladder   . Ischemic cardiomyopathy 11/04/2014  . Lumbago   . Memory change 03/28/2016  . Osteoarthrosis, unspecified whether generalized or localized, unspecified site   . Other malaise and fatigue   . Pain in joint, pelvic region and thigh   . Pain in joint, shoulder region   . Personal history of fall   . RLQ abdominal pain 01/17/2011  . Senile osteoporosis   . Small bowel obstruction ([place])   . STEMI (ST elevation myocardial infarction) ([place])  11/04/2014  . Unstable gait 11/04/2014  . Urine, incontinence, stress female 09/18/2014   Past Surgical History:  Procedure Laterality Date  . BREAST LUMPECTOMY Bilateral   . BREAST LUMPECTOMY WITH RADIOACTIVE SEED LOCALIZATION Left 03/15/2015   Procedure: LEFT BREAST LUMPECTOMY WITH  RADIOACTIVE SEED LOCALIZATION;  Surgeon: Fanny Skates, MD;  Location: [place];  Service: General;  Laterality: Left;  . CARDIAC CATHETERIZATION N/A 11/08/2014   Procedure: Left Heart Cath and Coronary Angiography;  Surgeon: [name], MD;  Location: [place] CV LAB;  Service: Cardiovascular;  Laterality: N/A;  . COLON SURGERY  02/1996   Colectomy, partial for diverticular bleed 90%  . EYE SURGERY    . JOINT REPLACEMENT Left 12/2000   Left knee; Dr. Wynelle Link  . LAPAROSCOPIC LYSIS OF ADHESIONS  11/15/10   Exploratory  . TONSILLECTOMY      Allergies  Allergen Reactions  . Sulfa Antibiotics Itching    Outpatient Encounter Medications as of 12/19/2017  Medication Sig  . acetaminophen (TYLENOL) 325 MG tablet Take 650 mg by mouth 3 (three) times daily.   Marland Kitchen atorvastatin (LIPITOR) 10 MG tablet Take 10 mg by mouth at bedtime.   . carboxymethylcellulose (REFRESH PLUS) 0.5 % SOLN Apply 2 drops to eye 2 (two) times daily.  . carvedilol (COREG) 25 MG tablet Take 25 mg by mouth 2 (two) times daily with a meal.  . cloNIDine (CATAPRES) 0.1 MG tablet Take 0.1 mg by mouth 2 (two) times daily.  . Coenzyme Q10 50 MG TABS Take one tablet by mouth at bedtime  . famotidine (PEPCID) 10 MG tablet Take 10 mg by mouth daily.  . ferrous sulfate 325 (65 FE) MG tablet Take 325 mg by mouth daily with breakfast.  . folic acid (FOLVITE) 1 MG tablet Take 1 mg by mouth daily.  . hydrALAZINE (APRESOLINE) 25 MG tablet Take 75 mg by mouth 3 (three) times daily.  . isosorbide mononitrate (IMDUR) 60 MG 24 hr tablet Take 90 mg by mouth at bedtime.   Marland Kitchen latanoprost (XALATAN) 0.005 % ophthalmic solution Place 1 drop into both eyes at bedtime.   . potassium chloride (K-DUR,KLOR-CON) 10 MEQ tablet Take 10 mEq by mouth daily.   . timolol (TIMOPTIC-XR) 0.5 % ophthalmic gel-forming Place 1 drop into both eyes daily.   Marland Kitchen torsemide (DEMADEX) 10 MG tablet Take 10 mg daily by mouth. Hold for SBP < 110  . vitamin B-12  (CYANOCOBALAMIN) 1000 MCG tablet Take 1,000 mcg See admin instructions by mouth. Take 1000 mcg by mouth daily On Mondays,Wednesdays and Fridays   No facility-administered encounter medications on file as of 12/19/2017.    ROS was provided with assistance of staff Review of Systems  Constitutional: Negative for activity change, appetite change, chills, diaphoresis, fatigue, fever and unexpected weight change.  HENT: Positive for hearing loss. Negative for congestion and voice change.   Respiratory: Positive for shortness of breath. Negative for cough and wheezing.        DOE  Cardiovascular: Positive for leg swelling. Negative for chest pain and palpitations.  Gastrointestinal: Negative for abdominal distention, abdominal pain, constipation, diarrhea, nausea and vomiting.  Genitourinary: Negative for difficulty urinating, dysuria and urgency.  Musculoskeletal: Positive for arthralgias and gait problem. Negative for joint swelling.  Skin: Negative for color change.  Neurological: Negative for dizziness, facial asymmetry, speech difficulty, weakness and headaches.       Memory lapses.   Psychiatric/Behavioral: Positive for confusion. Negative for agitation, behavioral problems, hallucinations and sleep disturbance. The patient is  not nervous/anxious.     Immunization History  Administered Date(s) Administered  . Influenza Whole 11/19/2011, 12/02/2012, 11/20/2017  . Influenza-Unspecified 12/06/2013, 12/06/2015, 12/02/2016  . PPD Test 11/10/2014  . Pneumococcal Conjugate-13 03/09/2009  . Pneumococcal Polysaccharide-23 02/18/2009  . Tdap 04/02/2016   Pertinent  Health Maintenance Due  Topic Date Due  . MAMMOGRAM  11/26/2017  . INFLUENZA VACCINE  Completed  . DEXA SCAN  Completed  . PNA vac Low Risk Adult  Completed   Fall Risk  02/05/2017 01/31/2017 07/13/2015 11/17/2014 04/21/2014  Falls in the past year? No No No Yes Yes  Number falls in past yr: - - - 1 1  Injury with Fall? - - - No No   Comment - - - - -  Risk for fall due to : - History of fall(s);Impaired balance/gait;Impaired vision - Impaired balance/gait;History of fall(s) -   Functional Status Survey:    Vitals:   12/19/17 1121  BP: (!) 160/84  Pulse: 76  Resp: 20  Temp: 97.6 F (36.4 C)  SpO2: 93%  Weight: 148 lb 12.8 oz (67.5 kg)  Height: 5\' 5"  (1.651 m)   Body mass index is 24.76 kg/m. Physical Exam  Constitutional: She appears well-developed and well-nourished.  HENT:  Head: Normocephalic.  Eyes: Pupils are equal, round, and reactive to light. EOM are normal.  Neck: Normal range of motion. Neck supple.  Cardiovascular: Normal rate and regular rhythm.  Murmur heard. Pulmonary/Chest: Effort normal. She has no wheezes. She has no rales.  Abdominal: Soft. She exhibits no distension. There is no tenderness. There is no guarding.  Musculoskeletal: She exhibits edema.  Trace edema BLE. SBA or limited assistance for transfer. W/c for mobility.   Neurological: She is alert. No cranial nerve deficit. She exhibits normal muscle tone. Coordination normal.  Oriented to persona and place.   Skin: Skin is warm and dry.  Psychiatric: She has a normal mood and affect. Her behavior is normal.    Labs reviewed: Recent Labs    03/31/17 0836  06/30/17 0820  09/04/17 09/29/17 0836 11/20/17  NA 139   < > 140   < > 139 141 140  K 4.0   < > 3.7   < > 3.9 3.9 3.8  CL 106  --  112*   < > 108 109 108  CO2 23  --  19*   < > 22 21* 22  GLUCOSE 97  --  141*  --   --  105*  --   BUN 25   < > 26   < > 30* 28* 30*  CREATININE 2.22*   < > 2.19*   < > 2.3* 2.14* 2.3*  CALCIUM 9.3  --  8.8   < > 8.5 9.3 8.4   < > = values in this interval not displayed.   Recent Labs    03/31/17 0836  06/30/17 0820  09/04/17 09/29/17 0836 11/20/17  AST 15   < > 12   < > 11* 16 16  ALT <6   < > 9   < > 6* 9 8  ALKPHOS 53   < > 46   < > 35 52 41  BILITOT 0.5  --  0.4  --   --  0.8  --   PROT 7.1  --  6.9  --  5.8 7.5 5.7  ALBUMIN  3.8  --  3.9   < > 3.6 4.2 3.4   < > = values in  this interval not displayed.   Recent Labs    03/31/17 0836  06/30/17 0820  09/29/17 0836 11/25/17 12/02/17  WBC 4.9   < > 6.0   < > 7.7 5.6 4.6  NEUTROABS 3.3  --  4.1  --  5.8  --   --   HGB 10.9*   < > 10.6*   < > 11.6 8.5* 8.5*  HCT 33.7*   < > 34.0*   < > 37.1 26* 26*  MCV 90.3  --  91.2  --  94.2  --   --   PLT 190   < > 208   < > 214 192 167   < > = values in this interval not displayed.   Lab Results  Component Value Date   TSH 2.21 04/04/2016   Lab Results  Component Value Date   HGBA1C 5.6 11/06/2014   Lab Results  Component Value Date   CHOL 113 11/04/2017   HDL 40 11/04/2017   LDLCALC 50 11/04/2017   TRIG 150 11/04/2017   CHOLHDL 3.1 11/06/2014    Significant Diagnostic Results in last 30 days:  No results found.  Assessment/Plan Hypertensive heart disease with chronic systolic congestive heart failure (HCC) HTN, blood pressure is controlled, continue Carvedilol 25mg  bid, Clonidine 0.1mg  bid, Hydralazine 75mg  tid. CHF, compensated, continue Furosemide 10mg  qd. CAD, stable, no angina since last visited, continue isosorbide 90mg  qd.   Anemia of chronic disease Anemia, persists,  last Hgb 8.5 12/02/17,  Continue  Vit L27, Folic acid, Fe. Pending CBC, no s/s of bleeding.   GERD (gastroesophageal reflux disease)  GERD stable, continue  Famotidine 10mg  qd.   Generalized osteoarthritis OA pian, mostly left knee when bearing weight, continue Tylenol 650mg  tid and wheelchair     Family/ staff Communication: plan of care reviewed with the patient and charge nurse.   Labs/tests ordered:  none  Time spend 25 minutes.

## 2017-12-19 NOTE — Assessment & Plan Note (Signed)
GERD stable, continue  Famotidine 10mg  qd.

## 2017-12-23 DIAGNOSIS — R293 Abnormal posture: Secondary | ICD-10-CM | POA: Diagnosis not present

## 2017-12-23 DIAGNOSIS — I10 Essential (primary) hypertension: Secondary | ICD-10-CM | POA: Diagnosis not present

## 2017-12-23 DIAGNOSIS — N183 Chronic kidney disease, stage 3 (moderate): Secondary | ICD-10-CM | POA: Diagnosis not present

## 2017-12-23 DIAGNOSIS — M6281 Muscle weakness (generalized): Secondary | ICD-10-CM | POA: Diagnosis not present

## 2017-12-23 DIAGNOSIS — R2681 Unsteadiness on feet: Secondary | ICD-10-CM | POA: Diagnosis not present

## 2017-12-23 DIAGNOSIS — M810 Age-related osteoporosis without current pathological fracture: Secondary | ICD-10-CM | POA: Diagnosis not present

## 2017-12-23 DIAGNOSIS — F0390 Unspecified dementia without behavioral disturbance: Secondary | ICD-10-CM | POA: Diagnosis not present

## 2017-12-23 DIAGNOSIS — M179 Osteoarthritis of knee, unspecified: Secondary | ICD-10-CM | POA: Diagnosis not present

## 2017-12-23 DIAGNOSIS — R609 Edema, unspecified: Secondary | ICD-10-CM | POA: Diagnosis not present

## 2017-12-23 DIAGNOSIS — I214 Non-ST elevation (NSTEMI) myocardial infarction: Secondary | ICD-10-CM | POA: Diagnosis not present

## 2017-12-23 DIAGNOSIS — R296 Repeated falls: Secondary | ICD-10-CM | POA: Diagnosis not present

## 2017-12-23 DIAGNOSIS — M25561 Pain in right knee: Secondary | ICD-10-CM | POA: Diagnosis not present

## 2017-12-23 DIAGNOSIS — M21061 Valgus deformity, not elsewhere classified, right knee: Secondary | ICD-10-CM | POA: Diagnosis not present

## 2017-12-23 DIAGNOSIS — C50412 Malignant neoplasm of upper-outer quadrant of left female breast: Secondary | ICD-10-CM | POA: Diagnosis not present

## 2017-12-23 DIAGNOSIS — I5032 Chronic diastolic (congestive) heart failure: Secondary | ICD-10-CM | POA: Diagnosis not present

## 2017-12-23 DIAGNOSIS — I350 Nonrheumatic aortic (valve) stenosis: Secondary | ICD-10-CM | POA: Diagnosis not present

## 2017-12-26 DIAGNOSIS — M810 Age-related osteoporosis without current pathological fracture: Secondary | ICD-10-CM | POA: Diagnosis not present

## 2017-12-26 DIAGNOSIS — M25561 Pain in right knee: Secondary | ICD-10-CM | POA: Diagnosis not present

## 2017-12-26 DIAGNOSIS — R293 Abnormal posture: Secondary | ICD-10-CM | POA: Diagnosis not present

## 2017-12-26 DIAGNOSIS — R2681 Unsteadiness on feet: Secondary | ICD-10-CM | POA: Diagnosis not present

## 2017-12-26 DIAGNOSIS — M21061 Valgus deformity, not elsewhere classified, right knee: Secondary | ICD-10-CM | POA: Diagnosis not present

## 2017-12-26 DIAGNOSIS — M6281 Muscle weakness (generalized): Secondary | ICD-10-CM | POA: Diagnosis not present

## 2017-12-26 DIAGNOSIS — R296 Repeated falls: Secondary | ICD-10-CM | POA: Diagnosis not present

## 2017-12-26 DIAGNOSIS — I214 Non-ST elevation (NSTEMI) myocardial infarction: Secondary | ICD-10-CM | POA: Diagnosis not present

## 2017-12-26 DIAGNOSIS — F0390 Unspecified dementia without behavioral disturbance: Secondary | ICD-10-CM | POA: Diagnosis not present

## 2017-12-26 DIAGNOSIS — N183 Chronic kidney disease, stage 3 (moderate): Secondary | ICD-10-CM | POA: Diagnosis not present

## 2017-12-26 DIAGNOSIS — R609 Edema, unspecified: Secondary | ICD-10-CM | POA: Diagnosis not present

## 2017-12-26 DIAGNOSIS — M179 Osteoarthritis of knee, unspecified: Secondary | ICD-10-CM | POA: Diagnosis not present

## 2017-12-26 DIAGNOSIS — I350 Nonrheumatic aortic (valve) stenosis: Secondary | ICD-10-CM | POA: Diagnosis not present

## 2017-12-26 DIAGNOSIS — C50412 Malignant neoplasm of upper-outer quadrant of left female breast: Secondary | ICD-10-CM | POA: Diagnosis not present

## 2017-12-26 DIAGNOSIS — I5032 Chronic diastolic (congestive) heart failure: Secondary | ICD-10-CM | POA: Diagnosis not present

## 2017-12-26 DIAGNOSIS — I10 Essential (primary) hypertension: Secondary | ICD-10-CM | POA: Diagnosis not present

## 2017-12-28 NOTE — Progress Notes (Signed)
[place]  [phone] [phone]  Clinic Follow Up Note   Patient Care Team: Mast, Man X, NP as PCP - General (Internal Medicine) Juanita Craver, MD as Consulting Physician (Gastroenterology) Shon Hough, MD as Consulting Physician (Ophthalmology) Thornell Sartorius, MD as Consulting Physician (Otolaryngology) [place] Mast, Man X, NP as Nurse Practitioner (Nurse Practitioner) Bo Merino, MD as Consulting Physician (Rheumatology) Suella Broad, MD as Consulting Physician (Physical Medicine and Rehabilitation) Netta Cedars, MD as Consulting Physician (Orthopedic Surgery) Fanny Skates, MD as Consulting Physician (General Surgery) Truitt Merle, MD as Consulting Physician (Hematology) Thea Silversmith, MD as Consulting Physician (Radiation Oncology) Rockwell Germany, RN as Registered Nurse Mauro Kaufmann, RN as Registered Nurse Jake Shark, Johny Blamer, NP as Nurse Practitioner (Nurse Practitioner) [name], MD as Consulting Physician (Cardiology)    Date of Service:  12/29/2017   CHIEF COMPLAINTS:  Follow up right breast cancer, triple negative   Oncology History   Breast cancer of upper-outer quadrant of left female breast   Staging form: Breast, AJCC 7th Edition     Clinical: Stage 0 (Tis (DCIS), N0, M0) - Unsigned       Breast cancer of upper-outer quadrant of left female breast ([place])   09/23/2014 Mammogram    Left breast: new cluster of calcifications in the left breast is indeterminate. Spot magnification views are recommended    10/13/2014 Initial Biopsy    Left breast biopsy: DCIS, high-grade, with calcification and necrosis. ER- (0%), PR- (0%)    10/13/2014 Clinical Stage    Stage 0: Tis N0    03/15/2015 Definitive Surgery    Left lumpectomy: DCIS, high grade, with comedonecrosis and calcifications, spanning 1.5 cm, ALH, ER- (0%), PR- (0%).    03/15/2015 Pathologic Stage    Stage 0: Tis  N0  Diagnosis 03/14/16 Breast, lumpectomy, Left - HIGH GRADE DUCTAL CARCINOMA IN SITU WITH COMEDONECROSIS AND CALCIFICATIONS, SPANNING APPROXIMATELY 1.5 CM IN GREATEST DIMENSION. - ATYPICAL LOBULAR HYPERPLASIA. - DUCTAL CARCINOMA IN SITU IS LESS THAN 0.2 CM IN SEVERAL AREAS TO THE ANTERIOR MARGIN. 1 of 3    03/29/2015 Survivorship    Survivorship care plan completed and mailed to patient in lieu of in person visit     Cancer of central portion of right female breast ([place])   11/26/2016 Mammogram    Diagnostic Mammogram and [place] 11/26/16  IMPRESSION:  The is a 4.2 cm lobulated mass in the right breast central to the nipple anterior depth is highly suggestive of malignancy. The lymph node in the right axilla is at a moderate concern but not classic for malignancy. An ultrasound guided biopsy is recommended.       11/26/2016 Initial Biopsy    Diagnosis 11/26/16 1. Breast, right, needle core biopsy, 12:00 o'clock - INVASIVE DUCTAL CARCINOMA - SEE COMMENT 2. Lymph node, needle/core biopsy, right axilla - METASTATIC CARCINOMA INVOLVING ONE LYMPH NODE     11/26/2016 Receptors her2    Estrogen Receptor: 0%, NEGATIVE Progesterone Receptor: 0%, NEGATIVE Proliferation Marker Ki67: 20% HER2 - NEGATIVE    11/29/2016 Initial Diagnosis    Infiltrating ductal carcinoma of right breast ([place])    01/10/2017 PET scan    PET 01/10/17  IMPRESSION: 1. Right subareolar mass measures 4.7 cm in long axis with maximum SUV 23.6 compatible with malignancy. There are 2 hypermetabolic mildly enlarged right axillary lymph nodes but no other metastatic lesions are identified. 2. Other imaging findings of potential clinical significance: Aortic Atherosclerosis (ICD10-I70.0). Coronary atherosclerosis. Mild  cardiomegaly. Trace left pleural effusion. Intracranial chronic ischemic microvascular white matter disease.    02/14/2017 - 03/14/2017 Radiation Therapy    Radiation therapy with Dr. Isidore Moos     HISTORY  OF PRESENTING ILLNESS: 03/14/16 [name] 84 y.o. female with multiple medical comorbidities, as listed below, is here because of recently diagnosed left breast DCIS. She presents to our multidisciplinary breast clinic today with her daughter.  This was discovered by screening mammogram. She did not have palpable breast mass, she denies any new symptoms lately. She had a stroke earlier this year, which resulted mild left-sided weakness. She is a resident in an assisted living, she is able to walk with a walker, and do some limited self care, but she is not active, spends most time on sitting during the day. Her daughter states she skips meals sometime, and does not go out often.   CURRENT THERAPY: observation and supportive Care  INTERVAL HISTORY:  [name] is here for a follow up of her triple negative right breast cancer.   Today, she is here with her caregiver from [place]. She is doing well and she denies CP or other new symptoms. She is on a wheelchair and is hard of hearing. She states that her appetite is good. She is not physically active.     MEDICAL HISTORY:  Past Medical History:  Diagnosis Date  . Arthritis    Osteoarthritis  . Cancer of left breast [facility]) August 2016  . Cerebral atherosclerosis 12/19/2012  . Cerebral atrophy ([place]) 12/19/2012  . Cerebral ischemia 12/19/2012   Microvascular   . CKD (chronic kidney disease) stage 3, GFR 30-59 ml/min (HCC) 11/03/2014  . Cough   . Dementia ([place])   . Diverticulosis of colon (without mention of hemorrhage)   . Edema   . Family history of breast cancer   . Flat feet   . GERD (gastroesophageal reflux disease)   . Glaucoma   . Heart murmur   . Hyperlipidemia   . Hypertension   . Hypertonicity of bladder   . Ischemic cardiomyopathy 11/04/2014  . Lumbago   . Memory change 03/28/2016  . Osteoarthrosis, unspecified whether generalized or localized, unspecified site   . Other malaise and fatigue   . Pain in joint,  pelvic region and thigh   . Pain in joint, shoulder region   . Personal history of fall   . RLQ abdominal pain 01/17/2011  . Senile osteoporosis   . Small bowel obstruction ([place])   . STEMI (ST elevation myocardial infarction) ([place]) 11/04/2014  . Unstable gait 11/04/2014  . Urine, incontinence, stress female 09/18/2014    SURGICAL HISTORY: Past Surgical History:  Procedure Laterality Date  . BREAST LUMPECTOMY Bilateral   . BREAST LUMPECTOMY WITH RADIOACTIVE SEED LOCALIZATION Left 03/15/2015   Procedure: LEFT BREAST LUMPECTOMY WITH RADIOACTIVE SEED LOCALIZATION;  Surgeon: Fanny Skates, MD;  Location: [facility];  Service: General;  Laterality: Left;  . CARDIAC CATHETERIZATION N/A 11/08/2014   Procedure: Left Heart Cath and Coronary Angiography;  Surgeon: [name], MD;  Location: [facility] CV [facility];  Service: Cardiovascular;  Laterality: N/A;  . COLON SURGERY  02/1996   Colectomy, partial for diverticular bleed 90%  . EYE SURGERY    . JOINT REPLACEMENT Left 12/2000   Left knee; Dr. Wynelle Link  . LAPAROSCOPIC LYSIS OF ADHESIONS  11/15/10   Exploratory  . TONSILLECTOMY     GYN HISTORY  Menarchal: 14 LMP: 52 Contraceptive: HRT: no  G0P0: adopted daughter  SOCIAL HISTORY: Social History   Socioeconomic History  . Marital status: Widowed    Spouse name: Not on file  . Number of children: Not on file  . Years of education: Not on file  . Highest education level: Not on file  Occupational History  . Not on file  Social Needs  . Financial resource strain: Patient refused  . Food insecurity:    Worry: Patient refused    Inability: Patient refused  . Transportation needs:    Medical: Patient refused    Non-medical: Patient refused  Tobacco Use  . Smoking status: Former Smoker    Packs/day: 0.50    Years: 30.00    Pack years: 15.00    Types: Cigarettes    Last attempt to quit: 12/16/1980    Years since quitting: 37.0  . Smokeless tobacco: Never Used  Substance and Sexual  Activity  . Alcohol use: Yes    Alcohol/week: 1.0 standard drinks    Types: 1 Glasses of wine per week    Comment: occasional glass of wine  . Drug use: No  . Sexual activity: Never  Lifestyle  . Physical activity:    Days per week: Patient refused    Minutes per session: Patient refused  . Stress: Patient refused  Relationships  . Social connections:    Talks on phone: Patient refused    Gets together: Patient refused    Attends religious service: Patient refused    Active member of club or organization: Patient refused    Attends meetings of clubs or organizations: Patient refused    Relationship status: Widowed  . Intimate partner violence:    Fear of current or ex partner: Patient refused    Emotionally abused: Patient refused    Physically abused: Patient refused    Forced sexual activity: Patient refused  Other Topics Concern  . Not on file  Social History Narrative   Lives at [facility] 05/2011   Widowed 2003   Living Will   [place] with cane   Never smoked    Exercise none     FAMILY HISTORY: Family History  Problem Relation Age of Onset  . Cancer Father 41       colon  . Cancer Maternal Aunt        breast cancer   . Cancer Cousin        breast cancer     ALLERGIES:  is allergic to sulfa antibiotics.  MEDICATIONS:  Current Outpatient Medications  Medication Sig Dispense Refill  . acetaminophen (TYLENOL) 325 MG tablet Take 650 mg by mouth 3 (three) times daily.     Marland Kitchen atorvastatin (LIPITOR) 10 MG tablet Take 10 mg by mouth at bedtime.     . carboxymethylcellulose (REFRESH PLUS) 0.5 % SOLN Apply 2 drops to eye 2 (two) times daily.    . carvedilol (COREG) 25 MG tablet Take 25 mg by mouth 2 (two) times daily with a meal.    . cloNIDine (CATAPRES) 0.1 MG tablet Take 0.1 mg by mouth 2 (two) times daily.    . Coenzyme Q10 50 MG TABS Take one tablet by mouth at bedtime    . famotidine (PEPCID) 10 MG tablet Take 10 mg by mouth daily.    . ferrous  sulfate 325 (65 FE) MG tablet Take 325 mg by mouth daily with breakfast.    . folic acid (FOLVITE) 1 MG tablet Take 1 mg by mouth daily.    . hydrALAZINE (APRESOLINE) 25 MG  tablet Take 75 mg by mouth 3 (three) times daily.    . isosorbide mononitrate (IMDUR) 60 MG 24 hr tablet Take 90 mg by mouth at bedtime.     Marland Kitchen latanoprost (XALATAN) 0.005 % ophthalmic solution Place 1 drop into both eyes at bedtime.     . potassium chloride (K-DUR,KLOR-CON) 10 MEQ tablet Take 10 mEq by mouth daily.     . timolol (TIMOPTIC-XR) 0.5 % ophthalmic gel-forming Place 1 drop into both eyes daily.     Marland Kitchen torsemide (DEMADEX) 10 MG tablet Take 10 mg daily by mouth. Hold for SBP < 110    . vitamin B-12 (CYANOCOBALAMIN) 1000 MCG tablet Take 1,000 mcg See admin instructions by mouth. Take 1000 mcg by mouth daily On Mondays,Wednesdays and Fridays     No current facility-administered medications for this visit.     REVIEW OF SYSTEMS:   Constitutional: Denies fevers, chills or abnormal night sweats (+) good appetite  Eyes: Denies blurriness of vision, double vision or watery eyes Ears, nose, mouth, throat, and face: Denies mucositis or sore throat (+) Hard of Hearing  Respiratory: Denies cough, dyspnea or wheezes Cardiovascular: Denies palpitation, chest discomfort or lower extremity swelling Gastrointestinal:  Denies nausea, heartburn or change in bowel habits Skin: Denies abnormal skin rashes Lymphatics: Denies new lymphadenopathy or easy bruising Neurological:Denies numbness, tingling or new weaknesses MSK:  (+) wheelchair bound  Breast: (+) palpable mass in right breast, improved, with mild  tenderness Behavioral/Psych: Mood is stable, no new changes  All other systems were reviewed with the patient and are negative.   PHYSICAL EXAMINATION: ECOG PERFORMANCE STATUS: 3 - Symptomatic, >50% confined to bed  Vitals:   12/29/17 1019  BP: 129/62  Pulse: 73  Resp: 18  Temp: 99.4 F (37.4 C)  SpO2: 94%   There  were no vitals filed for this visit.  GENERAL:alert, no distress and comfortable  (+) she is wheelchair bound  SKIN: skin color, texture, turgor are normal, no rashes or significant lesions EYES: normal, conjunctiva are pink and non-injected, sclera clear OROPHARYNX:no exudate, no erythema and lips, buccal mucosa, and tongue normal  NECK: supple, thyroid normal size, non-tender, without nodularity LYMPH:  no palpable lymphadenopathy in the cervical, axillary or inguinal LUNGS: clear to auscultation and percussion with normal breathing effort HEART: regular rate & rhythm and no murmurs and (+) mild lower extremity edema ABDOMEN:abdomen soft, non-tender and normal bowel sounds Musculoskeletal:no cyanosis of digits and no clubbing  PSYCH: alert & oriented x 3 with fluent speech NEURO: no focal motor/sensory deficits Breasts: Breast: Exam done in wheelchair: Bilateral breast are symmetric, no skin change or nipple discharge.  No palpable masses, the previously central right breast mass is difficult to palpate the on today's exam.  (+) mild tenderness of right breast.  No palpable axillary adenopathy.   LABORATORY DATA:  I have reviewed the data as listed CBC Latest Ref Rng & Units 12/29/2017 12/02/2017 11/25/2017  WBC 4.0 - 10.5 K/uL 7.5 4.6 5.6  Hemoglobin 12.0 - 15.0 g/dL 9.5(L) 8.5(A) 8.5(A)  Hematocrit 36.0 - 46.0 % 30.0(L) 26(A) 26(A)  Platelets 150 - 400 K/uL 197 167 192   CMP Latest Ref Rng & Units 12/29/2017 11/20/2017 09/29/2017  Glucose 70 - 99 mg/dL 117(H) - 105(H)  BUN 8 - 23 mg/dL 25(H) 30(A) 28(H)  Creatinine 0.44 - 1.00 mg/dL 2.76(H) 2.3(A) 2.14(H)  Sodium 135 - 145 mmol/L 138 140 141  Potassium 3.5 - 5.1 mmol/L 3.6 3.8 3.9  Chloride 98 -  111 mmol/L 107 108 109  CO2 22 - 32 mmol/L 22 22 21(L)  Calcium 8.9 - 10.3 mg/dL 8.8(L) 8.4 9.3  Total Protein 6.5 - 8.1 g/dL 6.7 5.7 7.5  Total Bilirubin 0.3 - 1.2 mg/dL 0.5 - 0.8  Alkaline Phos 38 - 126 U/L 45 41 52  AST 15 - 41 U/L  11(L) 16 16  ALT 0 - 44 U/L '7 8 9     '$ PATHOLOGY REPORT:  Diagnosis 11/26/16 1. Breast, right, needle core biopsy, 12:00 o'clock - INVASIVE DUCTAL CARCINOMA - SEE COMMENT 2. Lymph node, needle/core biopsy, right axilla - METASTATIC CARCINOMA INVOLVING ONE LYMPH NODE Microscopic Comment 1. Based on the biopsy, the carcinoma appears Nottingham grade 2 of 3 and measures 0.9 cm in greatest linear extent. Dr. Saralyn Pilar reviewed the case. Prognostic markers (ER/PR/ki-67/HER2-FISH) are pending and will be reported in an addendum. This case was called to Dr. Marcelo Baldy on November 27, 2016. 2. FLUORESCENCE IN-SITU HYBRIDIZATION Results: HER2 - NEGATIVE RATIO OF HER2/CEP17 SIGNALS 1.38 AVERAGE HER2 COPY NUMBER PER CELL 2.00 Reference Range: NEGATIVE HER2/CEP17 Ratio <2.0 and average HER2 copy number <4.0 EQUIVOCAL HER2/CEP17 Ratio <2.0 and average HER2 copy number >=4.0 and <6.0 POSITIVE HER2/CEP17 Ratio >=2.0 or <2.0 and average HER2 copy number >=6.0 Vicente Males MD Pathologist, Electronic Signature ( Signed 11/29/2016) 2. PROGNOSTIC INDICATORS Results: IMMUNOHISTOCHEMICAL AND MORPHOMETRIC ANALYSIS PERFORMED MANUALLY Estrogen Receptor: 0%, NEGATIVE Progesterone Receptor: 0%, NEGATIVE Proliferation Marker Ki67: 20% COMMENT: The negative hormone receptor study(ies) in this case has no internal positive control.   Diagnosis 03/14/16 Breast, lumpectomy, Left - HIGH GRADE DUCTAL CARCINOMA IN SITU WITH COMEDONECROSIS AND CALCIFICATIONS, SPANNING APPROXIMATELY 1.5 CM IN GREATEST DIMENSION. - ATYPICAL LOBULAR HYPERPLASIA. - DUCTAL CARCINOMA IN SITU IS LESS THAN 0.2 CM IN SEVERAL AREAS TO THE ANTERIOR MARGIN. 1 of 3 FINAL for Bethard, Siera M ([id number]) Diagnosis(continued) - DUCTAL CARCINOMA IN SITU IS FOCALLY LESS THAN 0.2 CM TO THE SUPERIOR AND POSTERIOR MARGINS. - OTHER MARGINS ARE NEGATIVE. - SEE ONCOLOGY TEMPLATE. Microscopic Comment BREAST, IN SITU CARCINOMA Specimen, including  laterality: Left partial breast. Procedure (include lymph node sampling sentinel-non-sentinel): Left breast lumpectomy. Grade of carcinoma: High grade. Necrosis: Yes, comedonecrosis is present. Estimated tumor size: (gross measurement): 1.5 cm. Treatment effect: Not applicable. Distance to closest margin: Ductal carcinoma in situ is less than 0.2 cm from anterior, superior and posterior margins. Breast prognostic profile: Performed on previous case [id number]. Estrogen receptor: 0%, negative. Progesterone receptor: 0%, negative. Lymph nodes: No lymph nodes received. TNM: pTis, pNX. Comments: A p63, smooth muscle myosin and calponin immunohistochemical stain is performed on three blocks (nine stains total) to rule out the possibility of invasive carcinoma. The staining pattern fails to demonstrate evidence of invasive carcinoma. As estrogen receptor and progesterone receptor were previously negative, these markers will be repeated on the tumor and reported in an addendum to follow. (RH:ds 03/16/15) PROGNOSTIC INDICATORS Results: IMMUNOHISTOCHEMICAL AND MORPHOMETRIC ANALYSIS PERFORMED MANUALLY Estrogen Receptor: 0%, NEGATIVE Progesterone Receptor: 0%, NEGATIVE COMMENT: The negative hormone receptor study(ies) in this case has an internal positive control.    Diagnosis 10/13/2014 Breast, left, needle core biopsy, calcs - HIGH GRADE DUCTAL CARCINOMA IN SITU WITH CALCIFICATIONS AND NECROSIS. - SEE MICROSCOPIC DESCRIPTION. - FOCAL ATYPICAL LOBULAR HYPERPLASIA. Microscopic Comment Immunohistochemistry for basal cell markers show positivity with Calponin, p63, and smooth muscle myosin supporting the diagnosis of high grade ductal carcinoma in situ. Estrogen and progesterone receptors will be performed. Results: IMMUNOHISTOCHEMICAL AND MORPHOMETRIC ANALYSIS PERFORMED MANUALLY Estrogen Receptor: 0%, NEGATIVE Progesterone  Receptor: 0%, NEGATIVE   RADIOGRAPHIC STUDIES: I have  personally reviewed the radiological images as listed and agreed with the findings in the report.  PET 01/10/17  IMPRESSION: 1. Right subareolar mass measures 4.7 cm in long axis with maximum SUV 23.6 compatible with malignancy. There are 2 hypermetabolic mildly enlarged right axillary lymph nodes but no other metastatic lesions are identified. 2. Other imaging findings of potential clinical significance: Aortic Atherosclerosis (ICD10-I70.0). Coronary atherosclerosis. Mild cardiomegaly. Trace left pleural effusion. Intracranial chronic ischemic microvascular white matter disease.  Diagnostic Mammogram and [place] 11/26/16  IMPRESSION:  The is a 4.2 cm lobulated mass in the right breast central to the nipple anterior depth is highly suggestive of malignancy. The lymph node in the right axilla is at a moderate concern but not classic for malignancy. An ultrasound guided biopsy is recommended.     Mammogram 09/28/2014 There is a new cluster of pleomorphic calcifications in the left breast at 12:00 middle depth.   ASSESSMENT & PLAN:  84 y.o. Caucasian female, postmenopausal, with multiple comorbidities, including a stroke in January 2016, wheelchair bound, dementia, myocardial infarction, CHF, a resident at assisted living and h/o of left breast DCIS in 2016.    1. Infiltrating ductal carcinoma of right breast, invasive ductal carcinoma, cT 3N1M0, stage IIIb, G2, triple Negative -I previously reviewed her image findings and her biopsy results. She has aggressive triple negative breast cancer.  -On initial physical exam, she has a large central breast mass, with bulky right adenopathy.   -I previously reviewed her staging PET scan, which showed no distant metastasis -Due to her rapidly growing disease she was evaluated for right mastectomy with Dr. Dalbert Batman, but due to her high risk of surgery and anesthesia secondary to her heart condition, patient's daughter has decided not to pursue mastectomy.    -Given her advanced age and comorbidities, she is not a candidate for chemotherapy.  -Due to her triple negative disease she is not a candidate for antiestrogen therapy.  -She completed palliative radiation 02/14/17-03/14/17, and her right breast tumor has shrunk significantly  -Given her recurrent breast cancer and family history she is eligible for genetic testing. If her results show she has BRCA gene mutation she can consider PARP inhibitor. She declined during her genetic counseling consult.  -She is clinically doing well, asymptomatic, exam showed no palpable breast mass or axillary adenopathy. -Labs reviewed today, Hg 9.5.  CMP pending. -She continues to show good response to radiation as her right breast mass is no longer palpable. She only has breast pain upon palpation. She is doing well overall, stable.  -I called her daughter Eustaquio Maize to update her condition  -Continue supportive care and observation -[facility]ogram and [place] [facility] next month for f/u  -F/u in 4 months   2. H/o Left breast DCIS, high-grade, ER negative/PR negative, 2016  -Previously reviewed her scan and breast biopsy results. -The patient had early stage disease. She is considered cured of disease after completing surgical resection.  -She was seen by Dr. Dalbert Batman, lumpectomy without sentinel lymph nodes biopsy was discussed with patient and her daughter. Given her advanced age and medical comorbidities, she does have some risk with anesthesia. Patient and her daughter are in favor of having surgery. Any form of adjuvant treatment is for prevention of disease recurrence.  -Given her negative ER and PR status of her tumor, I did not recommend any antiestrogen therapy.  -If she has wide negative surgical margin, she may not benefit much from adjuvant  radiation and also, given her advanced age. She was seen by Dr. Pablo Ledger  -She underwent Left Breast Lumpectomy on 03/15/15 by Dr. Dalbert Batman    3. Dementia -Wheelchair bound, She can  feed herself, otherwise she is under full scale care at [facility].  -Her daughter is her Legal guardian   4. HTN, H/o Stroke  - I encouraged her and her caregiver to take her anti-HTN medication as soon as she returns as these levels can cause a stroke. She should monitor her BP daily.    Plan  -Continue observation -Lab and f/u in 4 months  -bilateral Mammogram and right breast US next month at [place] questions were answered. The patient knows to call the clinic with any problems, questions or concerns. I spent 20 minutes counseling the patient face to face. The total time spent in the appointment was 25 minutes and more than 50% was on counseling.  Dierdre Searles Dweik am acting as scribe for Dr. Truitt Merle.  I have reviewed the above documentation for accuracy and completeness, and I agree with the above.      Truitt Merle, MD 12/29/2017 11:31 AM

## 2017-12-29 ENCOUNTER — Encounter: Payer: Self-pay | Admitting: Hematology

## 2017-12-29 ENCOUNTER — Inpatient Hospital Stay: Payer: Medicare Other

## 2017-12-29 ENCOUNTER — Inpatient Hospital Stay: Payer: Medicare Other | Attending: Hematology | Admitting: Hematology

## 2017-12-29 ENCOUNTER — Telehealth: Payer: Self-pay | Admitting: Hematology

## 2017-12-29 VITALS — BP 129/62 | HR 73 | Temp 99.4°F | Resp 18 | Ht 65.0 in

## 2017-12-29 DIAGNOSIS — I10 Essential (primary) hypertension: Secondary | ICD-10-CM | POA: Insufficient documentation

## 2017-12-29 DIAGNOSIS — C50412 Malignant neoplasm of upper-outer quadrant of left female breast: Secondary | ICD-10-CM

## 2017-12-29 DIAGNOSIS — N183 Chronic kidney disease, stage 3 (moderate): Secondary | ICD-10-CM | POA: Diagnosis not present

## 2017-12-29 DIAGNOSIS — Z923 Personal history of irradiation: Secondary | ICD-10-CM | POA: Diagnosis not present

## 2017-12-29 DIAGNOSIS — M21061 Valgus deformity, not elsewhere classified, right knee: Secondary | ICD-10-CM | POA: Diagnosis not present

## 2017-12-29 DIAGNOSIS — M179 Osteoarthritis of knee, unspecified: Secondary | ICD-10-CM | POA: Diagnosis not present

## 2017-12-29 DIAGNOSIS — R2681 Unsteadiness on feet: Secondary | ICD-10-CM | POA: Diagnosis not present

## 2017-12-29 DIAGNOSIS — Z79899 Other long term (current) drug therapy: Secondary | ICD-10-CM | POA: Insufficient documentation

## 2017-12-29 DIAGNOSIS — I350 Nonrheumatic aortic (valve) stenosis: Secondary | ICD-10-CM | POA: Diagnosis not present

## 2017-12-29 DIAGNOSIS — M25561 Pain in right knee: Secondary | ICD-10-CM | POA: Diagnosis not present

## 2017-12-29 DIAGNOSIS — C50111 Malignant neoplasm of central portion of right female breast: Secondary | ICD-10-CM | POA: Diagnosis not present

## 2017-12-29 DIAGNOSIS — M810 Age-related osteoporosis without current pathological fracture: Secondary | ICD-10-CM | POA: Diagnosis not present

## 2017-12-29 DIAGNOSIS — I69354 Hemiplegia and hemiparesis following cerebral infarction affecting left non-dominant side: Secondary | ICD-10-CM | POA: Insufficient documentation

## 2017-12-29 DIAGNOSIS — Z87891 Personal history of nicotine dependence: Secondary | ICD-10-CM | POA: Insufficient documentation

## 2017-12-29 DIAGNOSIS — R293 Abnormal posture: Secondary | ICD-10-CM | POA: Diagnosis not present

## 2017-12-29 DIAGNOSIS — R609 Edema, unspecified: Secondary | ICD-10-CM | POA: Diagnosis not present

## 2017-12-29 DIAGNOSIS — Z993 Dependence on wheelchair: Secondary | ICD-10-CM | POA: Insufficient documentation

## 2017-12-29 DIAGNOSIS — I5032 Chronic diastolic (congestive) heart failure: Secondary | ICD-10-CM | POA: Diagnosis not present

## 2017-12-29 DIAGNOSIS — R296 Repeated falls: Secondary | ICD-10-CM | POA: Diagnosis not present

## 2017-12-29 DIAGNOSIS — R531 Weakness: Secondary | ICD-10-CM | POA: Diagnosis not present

## 2017-12-29 DIAGNOSIS — Z171 Estrogen receptor negative status [ER-]: Principal | ICD-10-CM

## 2017-12-29 DIAGNOSIS — F0390 Unspecified dementia without behavioral disturbance: Secondary | ICD-10-CM | POA: Insufficient documentation

## 2017-12-29 DIAGNOSIS — I214 Non-ST elevation (NSTEMI) myocardial infarction: Secondary | ICD-10-CM | POA: Diagnosis not present

## 2017-12-29 DIAGNOSIS — M6281 Muscle weakness (generalized): Secondary | ICD-10-CM | POA: Diagnosis not present

## 2017-12-29 LAB — COMPREHENSIVE METABOLIC PANEL
ALBUMIN: 3.2 g/dL — AB (ref 3.5–5.0)
ALT: 7 U/L (ref 0–44)
ANION GAP: 9 (ref 5–15)
AST: 11 U/L — AB (ref 15–41)
Alkaline Phosphatase: 45 U/L (ref 38–126)
BUN: 25 mg/dL — ABNORMAL HIGH (ref 8–23)
CHLORIDE: 107 mmol/L (ref 98–111)
CO2: 22 mmol/L (ref 22–32)
Calcium: 8.8 mg/dL — ABNORMAL LOW (ref 8.9–10.3)
Creatinine, Ser: 2.76 mg/dL — ABNORMAL HIGH (ref 0.44–1.00)
GFR calc Af Amer: 16 mL/min — ABNORMAL LOW (ref >60)
GFR, EST NON AFRICAN AMERICAN: 14 mL/min — AB (ref >60)
Glucose, Bld: 117 mg/dL — ABNORMAL HIGH (ref 70–99)
POTASSIUM: 3.6 mmol/L (ref 3.5–5.1)
Sodium: 138 mmol/L (ref 135–145)
TOTAL PROTEIN: 6.7 g/dL (ref 6.5–8.1)
Total Bilirubin: 0.5 mg/dL (ref 0.3–1.2)

## 2017-12-29 LAB — CBC WITH DIFFERENTIAL/PLATELET
Abs Immature Granulocytes: 0.03 10*3/uL (ref 0.00–0.07)
BASOS PCT: 0 %
Basophils Absolute: 0 10*3/uL (ref 0.0–0.1)
Eosinophils Absolute: 0.2 10*3/uL (ref 0.0–0.5)
Eosinophils Relative: 3 %
HCT: 30 % — ABNORMAL LOW (ref 36.0–46.0)
Hemoglobin: 9.5 g/dL — ABNORMAL LOW (ref 12.0–15.0)
IMMATURE GRANULOCYTES: 0 %
Lymphocytes Relative: 9 %
Lymphs Abs: 0.6 10*3/uL — ABNORMAL LOW (ref 0.7–4.0)
MCH: 30 pg (ref 26.0–34.0)
MCHC: 31.7 g/dL (ref 30.0–36.0)
MCV: 94.6 fL (ref 80.0–100.0)
Monocytes Absolute: 1 10*3/uL (ref 0.1–1.0)
Monocytes Relative: 13 %
NEUTROS ABS: 5.6 10*3/uL (ref 1.7–7.7)
NEUTROS PCT: 75 %
PLATELETS: 197 10*3/uL (ref 150–400)
RBC: 3.17 MIL/uL — AB (ref 3.87–5.11)
RDW: 14.6 % (ref 11.5–15.5)
WBC: 7.5 10*3/uL (ref 4.0–10.5)
nRBC: 0 % (ref 0.0–0.2)

## 2017-12-29 NOTE — Telephone Encounter (Signed)
Scheduled appt per 11/11 los - gave patient AVS and calender per los.   

## 2017-12-30 DIAGNOSIS — D638 Anemia in other chronic diseases classified elsewhere: Secondary | ICD-10-CM | POA: Diagnosis not present

## 2017-12-30 LAB — CBC AND DIFFERENTIAL
HCT: 29 — AB (ref 36–46)
Hemoglobin: 9.5 — AB (ref 12.0–16.0)
Platelets: 211 (ref 150–399)
WBC: 8.3

## 2017-12-31 ENCOUNTER — Other Ambulatory Visit: Payer: Self-pay | Admitting: *Deleted

## 2017-12-31 DIAGNOSIS — I10 Essential (primary) hypertension: Secondary | ICD-10-CM | POA: Diagnosis not present

## 2017-12-31 DIAGNOSIS — C50211 Malignant neoplasm of upper-inner quadrant of right female breast: Secondary | ICD-10-CM | POA: Diagnosis not present

## 2017-12-31 DIAGNOSIS — I350 Nonrheumatic aortic (valve) stenosis: Secondary | ICD-10-CM | POA: Diagnosis not present

## 2018-01-01 DIAGNOSIS — D638 Anemia in other chronic diseases classified elsewhere: Secondary | ICD-10-CM | POA: Diagnosis not present

## 2018-01-05 DIAGNOSIS — I5032 Chronic diastolic (congestive) heart failure: Secondary | ICD-10-CM | POA: Diagnosis not present

## 2018-01-05 DIAGNOSIS — I10 Essential (primary) hypertension: Secondary | ICD-10-CM | POA: Diagnosis not present

## 2018-01-05 DIAGNOSIS — N183 Chronic kidney disease, stage 3 (moderate): Secondary | ICD-10-CM | POA: Diagnosis not present

## 2018-01-05 DIAGNOSIS — R293 Abnormal posture: Secondary | ICD-10-CM | POA: Diagnosis not present

## 2018-01-05 DIAGNOSIS — C50412 Malignant neoplasm of upper-outer quadrant of left female breast: Secondary | ICD-10-CM | POA: Diagnosis not present

## 2018-01-05 DIAGNOSIS — M179 Osteoarthritis of knee, unspecified: Secondary | ICD-10-CM | POA: Diagnosis not present

## 2018-01-05 DIAGNOSIS — R2681 Unsteadiness on feet: Secondary | ICD-10-CM | POA: Diagnosis not present

## 2018-01-05 DIAGNOSIS — F0390 Unspecified dementia without behavioral disturbance: Secondary | ICD-10-CM | POA: Diagnosis not present

## 2018-01-05 DIAGNOSIS — R296 Repeated falls: Secondary | ICD-10-CM | POA: Diagnosis not present

## 2018-01-05 DIAGNOSIS — I214 Non-ST elevation (NSTEMI) myocardial infarction: Secondary | ICD-10-CM | POA: Diagnosis not present

## 2018-01-05 DIAGNOSIS — M25561 Pain in right knee: Secondary | ICD-10-CM | POA: Diagnosis not present

## 2018-01-05 DIAGNOSIS — R609 Edema, unspecified: Secondary | ICD-10-CM | POA: Diagnosis not present

## 2018-01-05 DIAGNOSIS — M6281 Muscle weakness (generalized): Secondary | ICD-10-CM | POA: Diagnosis not present

## 2018-01-05 DIAGNOSIS — M21061 Valgus deformity, not elsewhere classified, right knee: Secondary | ICD-10-CM | POA: Diagnosis not present

## 2018-01-05 DIAGNOSIS — I350 Nonrheumatic aortic (valve) stenosis: Secondary | ICD-10-CM | POA: Diagnosis not present

## 2018-01-05 DIAGNOSIS — M810 Age-related osteoporosis without current pathological fracture: Secondary | ICD-10-CM | POA: Diagnosis not present

## 2018-01-06 DIAGNOSIS — R296 Repeated falls: Secondary | ICD-10-CM | POA: Diagnosis not present

## 2018-01-06 DIAGNOSIS — R293 Abnormal posture: Secondary | ICD-10-CM | POA: Diagnosis not present

## 2018-01-06 DIAGNOSIS — R609 Edema, unspecified: Secondary | ICD-10-CM | POA: Diagnosis not present

## 2018-01-06 DIAGNOSIS — F0390 Unspecified dementia without behavioral disturbance: Secondary | ICD-10-CM | POA: Diagnosis not present

## 2018-01-06 DIAGNOSIS — N183 Chronic kidney disease, stage 3 (moderate): Secondary | ICD-10-CM | POA: Diagnosis not present

## 2018-01-06 DIAGNOSIS — M21061 Valgus deformity, not elsewhere classified, right knee: Secondary | ICD-10-CM | POA: Diagnosis not present

## 2018-01-06 DIAGNOSIS — I10 Essential (primary) hypertension: Secondary | ICD-10-CM | POA: Diagnosis not present

## 2018-01-06 DIAGNOSIS — M810 Age-related osteoporosis without current pathological fracture: Secondary | ICD-10-CM | POA: Diagnosis not present

## 2018-01-06 DIAGNOSIS — I5032 Chronic diastolic (congestive) heart failure: Secondary | ICD-10-CM | POA: Diagnosis not present

## 2018-01-06 DIAGNOSIS — M179 Osteoarthritis of knee, unspecified: Secondary | ICD-10-CM | POA: Diagnosis not present

## 2018-01-06 DIAGNOSIS — I350 Nonrheumatic aortic (valve) stenosis: Secondary | ICD-10-CM | POA: Diagnosis not present

## 2018-01-06 DIAGNOSIS — I214 Non-ST elevation (NSTEMI) myocardial infarction: Secondary | ICD-10-CM | POA: Diagnosis not present

## 2018-01-06 DIAGNOSIS — C50412 Malignant neoplasm of upper-outer quadrant of left female breast: Secondary | ICD-10-CM | POA: Diagnosis not present

## 2018-01-06 DIAGNOSIS — M25561 Pain in right knee: Secondary | ICD-10-CM | POA: Diagnosis not present

## 2018-01-06 DIAGNOSIS — R2681 Unsteadiness on feet: Secondary | ICD-10-CM | POA: Diagnosis not present

## 2018-01-06 DIAGNOSIS — M6281 Muscle weakness (generalized): Secondary | ICD-10-CM | POA: Diagnosis not present

## 2018-01-07 DIAGNOSIS — C50412 Malignant neoplasm of upper-outer quadrant of left female breast: Secondary | ICD-10-CM | POA: Diagnosis not present

## 2018-01-07 DIAGNOSIS — F0390 Unspecified dementia without behavioral disturbance: Secondary | ICD-10-CM | POA: Diagnosis not present

## 2018-01-07 DIAGNOSIS — M25561 Pain in right knee: Secondary | ICD-10-CM | POA: Diagnosis not present

## 2018-01-07 DIAGNOSIS — M6281 Muscle weakness (generalized): Secondary | ICD-10-CM | POA: Diagnosis not present

## 2018-01-07 DIAGNOSIS — R2681 Unsteadiness on feet: Secondary | ICD-10-CM | POA: Diagnosis not present

## 2018-01-07 DIAGNOSIS — R609 Edema, unspecified: Secondary | ICD-10-CM | POA: Diagnosis not present

## 2018-01-07 DIAGNOSIS — N183 Chronic kidney disease, stage 3 (moderate): Secondary | ICD-10-CM | POA: Diagnosis not present

## 2018-01-07 DIAGNOSIS — R296 Repeated falls: Secondary | ICD-10-CM | POA: Diagnosis not present

## 2018-01-07 DIAGNOSIS — I5032 Chronic diastolic (congestive) heart failure: Secondary | ICD-10-CM | POA: Diagnosis not present

## 2018-01-07 DIAGNOSIS — I350 Nonrheumatic aortic (valve) stenosis: Secondary | ICD-10-CM | POA: Diagnosis not present

## 2018-01-07 DIAGNOSIS — R293 Abnormal posture: Secondary | ICD-10-CM | POA: Diagnosis not present

## 2018-01-07 DIAGNOSIS — M21061 Valgus deformity, not elsewhere classified, right knee: Secondary | ICD-10-CM | POA: Diagnosis not present

## 2018-01-07 DIAGNOSIS — M179 Osteoarthritis of knee, unspecified: Secondary | ICD-10-CM | POA: Diagnosis not present

## 2018-01-07 DIAGNOSIS — M810 Age-related osteoporosis without current pathological fracture: Secondary | ICD-10-CM | POA: Diagnosis not present

## 2018-01-07 DIAGNOSIS — I214 Non-ST elevation (NSTEMI) myocardial infarction: Secondary | ICD-10-CM | POA: Diagnosis not present

## 2018-01-07 DIAGNOSIS — I10 Essential (primary) hypertension: Secondary | ICD-10-CM | POA: Diagnosis not present

## 2018-01-08 ENCOUNTER — Telehealth: Payer: Self-pay

## 2018-01-08 NOTE — Telephone Encounter (Signed)
Faxed signed order to [facility], sent for scanning to chart.

## 2018-01-09 DIAGNOSIS — R296 Repeated falls: Secondary | ICD-10-CM | POA: Diagnosis not present

## 2018-01-09 DIAGNOSIS — F0390 Unspecified dementia without behavioral disturbance: Secondary | ICD-10-CM | POA: Diagnosis not present

## 2018-01-09 DIAGNOSIS — C50412 Malignant neoplasm of upper-outer quadrant of left female breast: Secondary | ICD-10-CM | POA: Diagnosis not present

## 2018-01-09 DIAGNOSIS — M25561 Pain in right knee: Secondary | ICD-10-CM | POA: Diagnosis not present

## 2018-01-09 DIAGNOSIS — M6281 Muscle weakness (generalized): Secondary | ICD-10-CM | POA: Diagnosis not present

## 2018-01-09 DIAGNOSIS — M21061 Valgus deformity, not elsewhere classified, right knee: Secondary | ICD-10-CM | POA: Diagnosis not present

## 2018-01-09 DIAGNOSIS — N183 Chronic kidney disease, stage 3 (moderate): Secondary | ICD-10-CM | POA: Diagnosis not present

## 2018-01-09 DIAGNOSIS — I5032 Chronic diastolic (congestive) heart failure: Secondary | ICD-10-CM | POA: Diagnosis not present

## 2018-01-09 DIAGNOSIS — I10 Essential (primary) hypertension: Secondary | ICD-10-CM | POA: Diagnosis not present

## 2018-01-09 DIAGNOSIS — M810 Age-related osteoporosis without current pathological fracture: Secondary | ICD-10-CM | POA: Diagnosis not present

## 2018-01-09 DIAGNOSIS — I214 Non-ST elevation (NSTEMI) myocardial infarction: Secondary | ICD-10-CM | POA: Diagnosis not present

## 2018-01-09 DIAGNOSIS — I350 Nonrheumatic aortic (valve) stenosis: Secondary | ICD-10-CM | POA: Diagnosis not present

## 2018-01-09 DIAGNOSIS — R609 Edema, unspecified: Secondary | ICD-10-CM | POA: Diagnosis not present

## 2018-01-09 DIAGNOSIS — R2681 Unsteadiness on feet: Secondary | ICD-10-CM | POA: Diagnosis not present

## 2018-01-09 DIAGNOSIS — M179 Osteoarthritis of knee, unspecified: Secondary | ICD-10-CM | POA: Diagnosis not present

## 2018-01-09 DIAGNOSIS — R293 Abnormal posture: Secondary | ICD-10-CM | POA: Diagnosis not present

## 2018-01-11 DIAGNOSIS — R05 Cough: Secondary | ICD-10-CM | POA: Diagnosis not present

## 2018-01-12 ENCOUNTER — Non-Acute Institutional Stay (SKILLED_NURSING_FACILITY): Payer: Medicare Other | Admitting: Nurse Practitioner

## 2018-01-12 ENCOUNTER — Encounter: Payer: Self-pay | Admitting: Nurse Practitioner

## 2018-01-12 DIAGNOSIS — I10 Essential (primary) hypertension: Secondary | ICD-10-CM

## 2018-01-12 DIAGNOSIS — I509 Heart failure, unspecified: Secondary | ICD-10-CM | POA: Diagnosis not present

## 2018-01-12 DIAGNOSIS — I5032 Chronic diastolic (congestive) heart failure: Secondary | ICD-10-CM

## 2018-01-12 DIAGNOSIS — N184 Chronic kidney disease, stage 4 (severe): Secondary | ICD-10-CM | POA: Diagnosis not present

## 2018-01-12 DIAGNOSIS — D72829 Elevated white blood cell count, unspecified: Secondary | ICD-10-CM

## 2018-01-12 DIAGNOSIS — J209 Acute bronchitis, unspecified: Secondary | ICD-10-CM

## 2018-01-12 DIAGNOSIS — D638 Anemia in other chronic diseases classified elsewhere: Secondary | ICD-10-CM | POA: Diagnosis not present

## 2018-01-12 NOTE — Progress Notes (Signed)
Location:  [place] Room Number: 5 Place of Service:  SNF (31) Provider:  Jhalil Silvera, Lennie Odor  NP  Gizzelle Lacomb X, NP  Patient Care Team: Kolbie Lepkowski X, NP as PCP - General (Internal Medicine) Juanita Craver, MD as Consulting Physician (Gastroenterology) Shon Hough, MD as Consulting Physician (Ophthalmology) Thornell Sartorius, MD as Consulting Physician (Otolaryngology) [place] Aleiyah Halpin X, NP as Nurse Practitioner (Nurse Practitioner) Bo Merino, MD as Consulting Physician (Rheumatology) Suella Broad, MD as Consulting Physician (Physical Medicine and Rehabilitation) Netta Cedars, MD as Consulting Physician (Orthopedic Surgery) Fanny Skates, MD as Consulting Physician (General Surgery) Truitt Merle, MD as Consulting Physician (Hematology) Thea Silversmith, MD as Consulting Physician (Radiation Oncology) Rockwell Germany, RN as Registered Nurse Mauro Kaufmann, RN as Registered Nurse Jake Shark, Johny Blamer, NP as Nurse Practitioner (Nurse Practitioner) [name], MD as Consulting Physician (Cardiology)  Extended Emergency Contact Information Primary Emergency Contact: [name],[name] Address: [address]          [place] [address] Johnnette Litter of [place] Phone: [phone] Mobile Phone: [phone] Relation: Daughter Secondary Emergency Contact: [name],[name]  [place] Mobile Phone: [phone] Relation: None  Code Status:  DNR Goals of care: Advanced Directive information Advanced Directives 12/19/2017  Does Patient Have a Medical Advance Directive? Yes  Type of Advance Directive Out of facility DNR (pink MOST or yellow form);Living will  Does patient want to make changes to medical advance directive? No - Patient declined  Copy of [place] in Chart? -  Pre-existing out of facility DNR order (yellow form or pink MOST form) -     Chief Complaint  Patient presents  with  . Acute Visit    C/o- wet non-productive cough    HPI:  Pt is a 84 y.o. female seen today for an acute visit for wheezing and wet cough x 2 days. Rocephin 1gm x 7 days, DuoNeb tid x 7days. CXR bilateral perihilar vascular congestion, grossly stable since prior study. Wbc 19.2, Hgb 8.6, plt 307. Pending BNP. Hx of CHF, on Torsemide 10mg  qd. Hx of anemia, on Fe daily. HTN CKD, baseline creat 2s, on Carvedilol 25mg  bid, Clonidine 0.1mg  bid, Hydralazine 75mg  tid, Isosorbide 90mg  qd.    Past Medical History:  Diagnosis Date  . Arthritis    Osteoarthritis  . Cancer of left breast [facility]) August 2016  . Cerebral atherosclerosis 12/19/2012  . Cerebral atrophy ([place]) 12/19/2012  . Cerebral ischemia 12/19/2012   Microvascular   . CKD (chronic kidney disease) stage 3, GFR 30-59 ml/min (HCC) 11/03/2014  . Cough   . Dementia ([place])   . Diverticulosis of colon (without mention of hemorrhage)   . Edema   . Family history of breast cancer   . Flat feet   . GERD (gastroesophageal reflux disease)   . Glaucoma   . Heart murmur   . Hyperlipidemia   . Hypertension   . Hypertonicity of bladder   . Ischemic cardiomyopathy 11/04/2014  . Lumbago   . Memory change 03/28/2016  . Osteoarthrosis, unspecified whether generalized or localized, unspecified site   . Other malaise and fatigue   . Pain in joint, pelvic region and thigh   . Pain in joint, shoulder region   . Personal history of fall   . RLQ abdominal pain 01/17/2011  . Senile osteoporosis   . Small bowel obstruction ([place])   . STEMI (ST elevation myocardial infarction) ([place]) 11/04/2014  . Unstable gait 11/04/2014  .  Urine, incontinence, stress female 09/18/2014   Past Surgical History:  Procedure Laterality Date  . BREAST LUMPECTOMY Bilateral   . BREAST LUMPECTOMY WITH RADIOACTIVE SEED LOCALIZATION Left 03/15/2015   Procedure: LEFT BREAST LUMPECTOMY WITH RADIOACTIVE SEED LOCALIZATION;  Surgeon: Fanny Skates, MD;  Location: [facility];  Service:  General;  Laterality: Left;  . CARDIAC CATHETERIZATION N/A 11/08/2014   Procedure: Left Heart Cath and Coronary Angiography;  Surgeon: [name], MD;  Location: [facility];  Service: Cardiovascular;  Laterality: N/A;  . COLON SURGERY  02/1996   Colectomy, partial for diverticular bleed 90%  . EYE SURGERY    . JOINT REPLACEMENT Left 12/2000   Left knee; Dr. Wynelle Link  . LAPAROSCOPIC LYSIS OF ADHESIONS  11/15/10   Exploratory  . TONSILLECTOMY      Allergies  Allergen Reactions  . Sulfa Antibiotics Itching    Outpatient Encounter Medications as of 01/12/2018  Medication Sig  . acetaminophen (TYLENOL) 325 MG tablet Take 650 mg by mouth 3 (three) times daily.   Marland Kitchen atorvastatin (LIPITOR) 10 MG tablet Take 10 mg by mouth at bedtime.   . carboxymethylcellulose (REFRESH PLUS) 0.5 % SOLN Apply 2 drops to eye 2 (two) times daily.  . carvedilol (COREG) 25 MG tablet Take 25 mg by mouth 2 (two) times daily with a meal.  . cefTRIAXone 1 g in dextrose 5 % 50 mL Inject 1 g into the vein daily.  . cloNIDine (CATAPRES) 0.1 MG tablet Take 0.1 mg by mouth 2 (two) times daily.  . Coenzyme Q10 50 MG TABS Take one tablet by mouth at bedtime  . famotidine (PEPCID) 10 MG tablet Take 10 mg by mouth at bedtime.   . ferrous sulfate 325 (65 FE) MG tablet Take 325 mg by mouth daily with breakfast.  . folic acid (FOLVITE) 1 MG tablet Take 1 mg by mouth daily.  Marland Kitchen guaiFENesin-codeine (ROBAFEN AC) 100-10 MG/5ML syrup Take 10 mLs by mouth every 6 (six) hours as needed for cough.  . hydrALAZINE (APRESOLINE) 25 MG tablet Take 75 mg by mouth 3 (three) times daily.  . isosorbide mononitrate (IMDUR) 60 MG 24 hr tablet Take 90 mg by mouth at bedtime.   Marland Kitchen latanoprost (XALATAN) 0.005 % ophthalmic solution Place 1 drop into both eyes at bedtime.   . Nutritional Supplements (FEEDING SUPPLEMENT, BOOST BREEZE,) LIQD Take by mouth 3 (three) times daily with meals.  . potassium chloride (K-DUR,KLOR-CON) 10 MEQ tablet Take 10  mEq by mouth daily.   . timolol (TIMOPTIC-XR) 0.5 % ophthalmic gel-forming Place 1 drop into both eyes daily.   Marland Kitchen torsemide (DEMADEX) 10 MG tablet Take 10 mg daily by mouth. Hold for SBP < 110  . vitamin B-12 (CYANOCOBALAMIN) 1000 MCG tablet Take 1,000 mcg See admin instructions by mouth. Take 1000 mcg by mouth daily On Mondays,Wednesdays and Fridays  . [DISCONTINUED] ipratropium-albuterol (DUONEB) 0.5-2.5 (3) MG/3ML SOLN Take 3 mLs by nebulization 3 (three) times daily.   No facility-administered encounter medications on file as of 01/12/2018.     Review of Systems  Constitutional: Positive for activity change, appetite change and fatigue. Negative for chills, diaphoresis and fever.  HENT: Positive for hearing loss. Negative for congestion and voice change.   Respiratory: Positive for cough, shortness of breath and wheezing. Negative for apnea and chest tightness.   Cardiovascular: Positive for leg swelling. Negative for chest pain and palpitations.  Gastrointestinal: Negative for abdominal distention, abdominal pain, constipation, diarrhea, nausea and vomiting.  Genitourinary: Negative for difficulty  urinating, dysuria and urgency.  Musculoskeletal: Positive for arthralgias and gait problem.  Skin: Negative for color change and pallor.  Neurological: Negative for dizziness, facial asymmetry, speech difficulty, weakness, numbness and headaches.       Memory lapses.   Psychiatric/Behavioral: Positive for confusion. Negative for agitation, behavioral problems, hallucinations and sleep disturbance. The patient is not nervous/anxious.     Immunization History  Administered Date(s) Administered  . Influenza Whole 11/19/2011, 12/02/2012, 11/20/2017  . Influenza-Unspecified 12/06/2013, 12/06/2015, 12/02/2016  . PPD Test 11/10/2014  . Pneumococcal Conjugate-13 03/09/2009  . Pneumococcal Polysaccharide-23 02/18/2009  . Tdap 04/02/2016   Pertinent  Health Maintenance Due  Topic Date Due  .  MAMMOGRAM  11/26/2017  . INFLUENZA VACCINE  Completed  . DEXA SCAN  Completed  . PNA vac Low Risk Adult  Completed   Fall Risk  02/05/2017 01/31/2017 07/13/2015 11/17/2014 04/21/2014  Falls in the past year? No No No Yes Yes  Number falls in past yr: - - - 1 1  Injury with Fall? - - - No No  Comment - - - - -  Risk for fall due to : - History of fall(s);Impaired balance/gait;Impaired vision - Impaired balance/gait;History of fall(s) -   Functional Status Survey:    Vitals:   01/12/18 1000  BP: (!) 164/70  Pulse: 87  Resp: (!) 24  Temp: 98.5 F (36.9 C)  SpO2: 95%  Weight: 142 lb 6.4 oz (64.6 kg)  Height: 5\' 5"  (1.651 m)   Body mass index is 23.7 kg/m. Physical Exam  Constitutional: She appears well-developed and well-nourished.  HENT:  Head: Normocephalic and atraumatic.  Eyes: Pupils are equal, round, and reactive to light. EOM are normal.  Neck: Normal range of motion. Neck supple. No JVD present. No thyromegaly present.  Cardiovascular: Normal rate and regular rhythm.  Murmur heard. Pulmonary/Chest: Effort normal. She has wheezes. She has rales. She exhibits no tenderness.  Central expiratory wheezes. Bibasilar rales.   Abdominal: Soft. She exhibits no distension. There is no tenderness. There is no rebound and no guarding.  Musculoskeletal: She exhibits edema.  Trace edema BLE. Needs assistance for transfer. W/c for mobility.   Neurological: She is alert. No cranial nerve deficit. She exhibits normal muscle tone. Coordination normal.  Oriented to person and place.   Skin: Skin is warm and dry.  Psychiatric: She has a normal mood and affect. Her behavior is normal.    Labs reviewed: Recent Labs    06/30/17 0820  09/29/17 0836 11/20/17 12/29/17 1004  NA 140   < > 141 140 138  K 3.7   < > 3.9 3.8 3.6  CL 112*   < > 109 108 107  CO2 19*   < > 21* 22 22  GLUCOSE 141*  --  105*  --  117*  BUN 26   < > 28* 30* 25*  CREATININE 2.19*   < > 2.14* 2.3* 2.76*  CALCIUM  8.8   < > 9.3 8.4 8.8*   < > = values in this interval not displayed.   Recent Labs    06/30/17 0820  09/29/17 0836 11/20/17 12/29/17 1004  AST 12   < > 16 16 11*  ALT 9   < > 9 8 7   ALKPHOS 46   < > 52 41 45  BILITOT 0.4  --  0.8  --  0.5  PROT 6.9   < > 7.5 5.7 6.7  ALBUMIN 3.9   < > 4.2 3.4  3.2*   < > = values in this interval not displayed.   Recent Labs    06/30/17 0820  09/29/17 0836  12/02/17 12/29/17 1004 12/30/17  WBC 6.0   < > 7.7   < > 4.6 7.5 8.3  NEUTROABS 4.1  --  5.8  --   --  5.6  --   HGB 10.6*   < > 11.6   < > 8.5* 9.5* 9.5*  HCT 34.0*   < > 37.1   < > 26* 30.0* 29*  MCV 91.2  --  94.2  --   --  94.6  --   PLT 208   < > 214   < > 167 197 211   < > = values in this interval not displayed.   Lab Results  Component Value Date   TSH 2.21 04/04/2016   Lab Results  Component Value Date   HGBA1C 5.6 11/06/2014   Lab Results  Component Value Date   CHOL 113 11/04/2017   HDL 40 11/04/2017   LDLCALC 50 11/04/2017   TRIG 150 11/04/2017   CHOLHDL 3.1 11/06/2014    Significant Diagnostic Results in last 30 days:  No results found.  Assessment/Plan Acute bronchitis 01/11/18 wheezing and wet cough, 01/12/18 no wheezing, started 01/11/18 Rocephin 1gm x 7 days, DuoNeb tid x 7days. CXR bilateral perihilar vascular congestion, grossly stable since prior study. Wbc 19.2, Hgb 8.6, plt 307. Observe.   Chronic diastolic congestive heart failure (HCC) Chronic, CXR showed CHF, cough, swelling BLE, BNP 01/12/18 755, increase Torsemide 20mg  qd, weight 3x /week, BMP one week.   Hypertension Continue Carvedilol 25mg  bid, Clonidine 0.1mg  bid, Hydralazine 75mg  tid, Isosorbide 90mg  qd.   CKD (chronic kidney disease) stage 4, GFR 15-29 ml/min (HCC) Chronic, last creat 2.30, baseline creat 2's, pending BMP  Anemia of chronic disease Hgb 8.7 01/01/18, 8.6 01/12/18. Continue Fe.   Leukocytosis Wbc 19.2 01/12/18     Family/ staff Communication: plan of care  reviewed with the patient and charge nurse.   Labs/tests ordered:  CXR done 01/11/18. CBC done 01/12/18. Pending BNP. BMP one week  Time spend 25 minutes.

## 2018-01-12 NOTE — Assessment & Plan Note (Signed)
01/11/18 wheezing and wet cough, 01/12/18 no wheezing, started 01/11/18 Rocephin 1gm x 7 days, DuoNeb tid x 7days. CXR bilateral perihilar vascular congestion, grossly stable since prior study. Wbc 19.2, Hgb 8.6, plt 307. Observe.

## 2018-01-12 NOTE — Assessment & Plan Note (Signed)
Continue Carvedilol 25mg  bid, Clonidine 0.1mg  bid, Hydralazine 75mg  tid, Isosorbide 90mg  qd.

## 2018-01-12 NOTE — Assessment & Plan Note (Signed)
Chronic, last creat 2.30, baseline creat 2's, pending BMP

## 2018-01-12 NOTE — Assessment & Plan Note (Addendum)
Chronic, CXR showed CHF, cough, swelling BLE, BNP 01/12/18 755, increase Torsemide 20mg  qd, weight 3x /week, BMP one week.

## 2018-01-12 NOTE — Assessment & Plan Note (Signed)
Wbc 19.2 01/12/18

## 2018-01-12 NOTE — Assessment & Plan Note (Signed)
Hgb 8.7 01/01/18, 8.6 01/12/18. Continue Fe.

## 2018-01-13 ENCOUNTER — Encounter: Payer: Self-pay | Admitting: *Deleted

## 2018-01-13 DIAGNOSIS — Z853 Personal history of malignant neoplasm of breast: Secondary | ICD-10-CM | POA: Diagnosis not present

## 2018-01-13 DIAGNOSIS — Z923 Personal history of irradiation: Secondary | ICD-10-CM | POA: Diagnosis not present

## 2018-01-13 DIAGNOSIS — Z0000 Encounter for general adult medical examination without abnormal findings: Secondary | ICD-10-CM | POA: Diagnosis not present

## 2018-01-13 DIAGNOSIS — C50911 Malignant neoplasm of unspecified site of right female breast: Secondary | ICD-10-CM | POA: Diagnosis not present

## 2018-01-13 LAB — HM MAMMOGRAPHY

## 2018-01-14 ENCOUNTER — Encounter: Payer: Self-pay | Admitting: *Deleted

## 2018-01-22 ENCOUNTER — Encounter: Payer: Self-pay | Admitting: Family Medicine

## 2018-01-22 ENCOUNTER — Non-Acute Institutional Stay (SKILLED_NURSING_FACILITY): Payer: Medicare Other | Admitting: Family Medicine

## 2018-01-22 DIAGNOSIS — Z171 Estrogen receptor negative status [ER-]: Secondary | ICD-10-CM

## 2018-01-22 DIAGNOSIS — F0150 Vascular dementia without behavioral disturbance: Secondary | ICD-10-CM | POA: Diagnosis not present

## 2018-01-22 DIAGNOSIS — I63239 Cerebral infarction due to unspecified occlusion or stenosis of unspecified carotid arteries: Secondary | ICD-10-CM | POA: Diagnosis not present

## 2018-01-22 DIAGNOSIS — R609 Edema, unspecified: Secondary | ICD-10-CM | POA: Diagnosis not present

## 2018-01-22 DIAGNOSIS — J209 Acute bronchitis, unspecified: Secondary | ICD-10-CM

## 2018-01-22 DIAGNOSIS — C50412 Malignant neoplasm of upper-outer quadrant of left female breast: Secondary | ICD-10-CM | POA: Diagnosis not present

## 2018-01-22 DIAGNOSIS — R2681 Unsteadiness on feet: Secondary | ICD-10-CM

## 2018-01-22 DIAGNOSIS — N184 Chronic kidney disease, stage 4 (severe): Secondary | ICD-10-CM

## 2018-01-22 NOTE — Progress Notes (Signed)
Provider:  Alain Honey, MD Location:  [place] Room Number: N005/A Place of Service:  SNF (31)  PCP: Mast, Man X, NP Patient Care Team: Mast, Man X, NP as PCP - General (Internal Medicine) Juanita Craver, MD as Consulting Physician (Gastroenterology) Shon Hough, MD as Consulting Physician (Ophthalmology) Thornell Sartorius, MD as Consulting Physician (Otolaryngology) [place] Mast, Man X, NP as Nurse Practitioner (Nurse Practitioner) Bo Merino, MD as Consulting Physician (Rheumatology) Suella Broad, MD as Consulting Physician (Physical Medicine and Rehabilitation) Netta Cedars, MD as Consulting Physician (Orthopedic Surgery) Fanny Skates, MD as Consulting Physician (General Surgery) Truitt Merle, MD as Consulting Physician (Hematology) Thea Silversmith, MD as Consulting Physician (Radiation Oncology) Rockwell Germany, RN as Registered Nurse Mauro Kaufmann, RN as Registered Nurse Jake Shark, Johny Blamer, NP as Nurse Practitioner (Nurse Practitioner) [name], MD as Consulting Physician (Cardiology)  Extended Emergency Contact Information Primary Emergency Contact: [name],[name] Address: [address]          [place] [address] Johnnette Litter of [place] Phone: [phone] Mobile Phone: [phone] Relation: Daughter Secondary Emergency Contact: Mort Sawyers [place] of [place] Phone: [phone] Relation: None  Code Status:DNR  Goals of Care: Advanced Directive information Advanced Directives 01/22/2018  Does Patient Have a Medical Advance Directive? Yes  Type of Advance Directive Out of facility DNR (pink MOST or yellow form)  Does patient want to make changes to medical advance directive? No - Patient declined  Copy of [place] in Chart? -  Pre-existing out of facility DNR order (yellow form or pink MOST form) Pink MOST form placed in chart (order not valid  for inpatient use);Yellow form placed in chart (order not valid for inpatient use)      Chief Complaint  Patient presents with  . Medical Management of Chronic Issues    Routine visit    HPI: Patient is a 84 y.o. female seen today for medical management of chronic problems including: Recent weight loss, recent bronchitis, breast cancer, essential hypertension. Nursing has noted a 6 or 7 pound weight loss over the last month.  Patient tells me her appetite is good but she does not always like foods served her. There have been no behaviors described.  She did tell me that she walked without aids but she has a wheelchair in her room and is reported to have some left sided weakness after a stroke.  She was recently treated for bronchitis but denies any present cough. She was also seen recent baby only by oncology.  She does have breast disease bilaterally and there is some consideration of a left breast lumpectomy.  Any other surgery is considered too risky given her age and other morbidities.  Past Medical History:  Diagnosis Date  . Arthritis    Osteoarthritis  . Cancer of left breast [facility]) August 2016  . Cerebral atherosclerosis 12/19/2012  . Cerebral atrophy ([place]) 12/19/2012  . Cerebral ischemia 12/19/2012   Microvascular   . CKD (chronic kidney disease) stage 3, GFR 30-59 ml/min (HCC) 11/03/2014  . Cough   . Dementia ([place])   . Diverticulosis of colon (without mention of hemorrhage)   . Edema   . Family history of breast cancer   . Flat feet   . GERD (gastroesophageal reflux disease)   . Glaucoma   . Heart murmur   . Hyperlipidemia   . Hypertension   . Hypertonicity of bladder   . Ischemic cardiomyopathy 11/04/2014  . Lumbago   .  Memory change 03/28/2016  . Osteoarthrosis, unspecified whether generalized or localized, unspecified site   . Other malaise and fatigue   . Pain in joint, pelvic region and thigh   . Pain in joint, shoulder region   . Personal history of fall   . RLQ  abdominal pain 01/17/2011  . Senile osteoporosis   . Small bowel obstruction ([place])   . STEMI (ST elevation myocardial infarction) ([place]) 11/04/2014  . Unstable gait 11/04/2014  . Urine, incontinence, stress female 09/18/2014   Past Surgical History:  Procedure Laterality Date  . BREAST LUMPECTOMY Bilateral   . BREAST LUMPECTOMY WITH RADIOACTIVE SEED LOCALIZATION Left 03/15/2015   Procedure: LEFT BREAST LUMPECTOMY WITH RADIOACTIVE SEED LOCALIZATION;  Surgeon: Fanny Skates, MD;  Location: [facility];  Service: General;  Laterality: Left;  . CARDIAC CATHETERIZATION N/A 11/08/2014   Procedure: Left Heart Cath and Coronary Angiography;  Surgeon: [name], MD;  Location: [facility];  Service: Cardiovascular;  Laterality: N/A;  . COLON SURGERY  02/1996   Colectomy, partial for diverticular bleed 90%  . EYE SURGERY    . JOINT REPLACEMENT Left 12/2000   Left knee; Dr. Wynelle Link  . LAPAROSCOPIC LYSIS OF ADHESIONS  11/15/10   Exploratory  . TONSILLECTOMY      reports that she quit smoking about 37 years ago. Her smoking use included cigarettes. She has a 15.00 pack-year smoking history. She has never used smokeless tobacco. She reports that she drinks about 1.0 standard drinks of alcohol per week. She reports that she does not use drugs. Social History   Socioeconomic History  . Marital status: Widowed    Spouse name: Not on file  . Number of children: Not on file  . Years of education: Not on file  . Highest education level: Not on file  Occupational History  . Not on file  Social Needs  . Financial resource strain: Patient refused  . Food insecurity:    Worry: Patient refused    Inability: Patient refused  . Transportation needs:    Medical: Patient refused    Non-medical: Patient refused  Tobacco Use  . Smoking status: Former Smoker    Packs/day: 0.50    Years: 30.00    Pack years: 15.00    Types: Cigarettes    Last attempt to quit: 12/16/1980    Years since quitting: 37.1    . Smokeless tobacco: Never Used  Substance and Sexual Activity  . Alcohol use: Yes    Alcohol/week: 1.0 standard drinks    Types: 1 Glasses of wine per week    Comment: occasional glass of wine  . Drug use: No  . Sexual activity: Never  Lifestyle  . Physical activity:    Days per week: Patient refused    Minutes per session: Patient refused  . Stress: Patient refused  Relationships  . Social connections:    Talks on phone: Patient refused    Gets together: Patient refused    Attends religious service: Patient refused    Active member of club or organization: Patient refused    Attends meetings of clubs or organizations: Patient refused    Relationship status: Widowed  . Intimate partner violence:    Fear of current or ex partner: Patient refused    Emotionally abused: Patient refused    Physically abused: Patient refused    Forced sexual activity: Patient refused  Other Topics Concern  . Not on file  Social History Narrative   Lives at [facility]  05/2011   Widowed 2003   Living Will   Walks with cane   Never smoked    Exercise none     Functional Status Survey:    Family History  Problem Relation Age of Onset  . Cancer Father 56       colon  . Cancer Maternal Aunt        breast cancer   . Cancer Cousin        breast cancer     Health Maintenance  Topic Date Due  . MAMMOGRAM  01/14/2019  . TETANUS/TDAP  04/02/2026  . INFLUENZA VACCINE  Completed  . DEXA SCAN  Completed  . PNA vac Low Risk Adult  Completed    Allergies  Allergen Reactions  . Sulfa Antibiotics Itching    Outpatient Encounter Medications as of 01/22/2018  Medication Sig  . acetaminophen (TYLENOL) 325 MG tablet Take 650 mg by mouth 3 (three) times daily.   Marland Kitchen atorvastatin (LIPITOR) 10 MG tablet Take 10 mg by mouth at bedtime.   . carboxymethylcellulose (REFRESH PLUS) 0.5 % SOLN Apply 2 drops to eye 2 (two) times daily.  . carvedilol (COREG) 25 MG tablet Take 25 mg by mouth 2  (two) times daily with a meal.  . cloNIDine (CATAPRES) 0.1 MG tablet Take 0.1 mg by mouth 2 (two) times daily.  . Coenzyme Q10 50 MG TABS Take one tablet by mouth at bedtime  . famotidine (PEPCID) 10 MG tablet Take 10 mg by mouth at bedtime.   . ferrous sulfate 325 (65 FE) MG tablet Take 325 mg by mouth daily with breakfast.  . folic acid (FOLVITE) 1 MG tablet Take 1 mg by mouth daily.  . hydrALAZINE (APRESOLINE) 25 MG tablet Take 75 mg by mouth 3 (three) times daily.  . isosorbide mononitrate (IMDUR) 60 MG 24 hr tablet Take 90 mg by mouth at bedtime.   Marland Kitchen latanoprost (XALATAN) 0.005 % ophthalmic solution Place 1 drop into both eyes at bedtime.   . Nutritional Supplements (FEEDING SUPPLEMENT, BOOST BREEZE,) LIQD Take by mouth 3 (three) times daily with meals.  . potassium chloride (K-DUR,KLOR-CON) 10 MEQ tablet Take 10 mEq by mouth daily.   . timolol (TIMOPTIC-XR) 0.5 % ophthalmic gel-forming Place 1 drop into both eyes daily.   Marland Kitchen torsemide (DEMADEX) 10 MG tablet Take 10 mg daily by mouth. Hold for SBP < 110  . vitamin B-12 (CYANOCOBALAMIN) 1000 MCG tablet Take 1,000 mcg See admin instructions by mouth. Take 1000 mcg by mouth daily On Mondays,Wednesdays and Fridays   No facility-administered encounter medications on file as of 01/22/2018.     Review of Systems  Constitutional: Negative.   HENT: Positive for hearing loss.   Respiratory: Negative.   Cardiovascular: Negative.   Genitourinary: Negative.   Musculoskeletal: Positive for gait problem.  Neurological: Positive for weakness. Negative for numbness and headaches.  Psychiatric/Behavioral: Negative.     Vitals:   01/22/18 0818  BP: 138/66  Pulse: 76  Resp: (!) 22  Temp: (!) 97.5 F (36.4 C)  SpO2: 96%  Weight: 136 lb (61.7 kg)  Height: 5\' 5"  (1.651 m)   Body mass index is 22.63 kg/m. Physical Exam  Constitutional: She appears well-developed and well-nourished.  HENT:  Head: Normocephalic.  Mouth/Throat: Oropharynx is  clear and moist.  Eyes: Pupils are equal, round, and reactive to light. EOM are normal.  Cardiovascular: Normal rate and regular rhythm.  Murmur heard. Pulmonary/Chest: Effort normal and breath sounds normal.  Abdominal: Soft. Bowel  sounds are normal.  Neurological: She is alert.  Psychiatric: She has a normal mood and affect. Her behavior is normal.  Nursing note and vitals reviewed.   Labs reviewed: Basic Metabolic Panel: Recent Labs    06/30/17 0820  09/29/17 0836 11/20/17 12/29/17 1004  NA 140   < > 141 140 138  K 3.7   < > 3.9 3.8 3.6  CL 112*   < > 109 108 107  CO2 19*   < > 21* 22 22  GLUCOSE 141*  --  105*  --  117*  BUN 26   < > 28* 30* 25*  CREATININE 2.19*   < > 2.14* 2.3* 2.76*  CALCIUM 8.8   < > 9.3 8.4 8.8*   < > = values in this interval not displayed.   Liver Function Tests: Recent Labs    06/30/17 0820  09/29/17 0836 11/20/17 12/29/17 1004  AST 12   < > 16 16 11*  ALT 9   < > 9 8 7   ALKPHOS 46   < > 52 41 45  BILITOT 0.4  --  0.8  --  0.5  PROT 6.9   < > 7.5 5.7 6.7  ALBUMIN 3.9   < > 4.2 3.4 3.2*   < > = values in this interval not displayed.   No results for input(s): LIPASE, AMYLASE in the last 8760 hours. No results for input(s): AMMONIA in the last 8760 hours. CBC: Recent Labs    06/30/17 0820  09/29/17 0836  12/02/17 12/29/17 1004 12/30/17  WBC 6.0   < > 7.7   < > 4.6 7.5 8.3  NEUTROABS 4.1  --  5.8  --   --  5.6  --   HGB 10.6*   < > 11.6   < > 8.5* 9.5* 9.5*  HCT 34.0*   < > 37.1   < > 26* 30.0* 29*  MCV 91.2  --  94.2  --   --  94.6  --   PLT 208   < > 214   < > 167 197 211   < > = values in this interval not displayed.   Cardiac Enzymes: No results for input(s): CKTOTAL, CKMB, CKMBINDEX, TROPONINI in the last 8760 hours. BNP: Invalid input(s): POCBNP Lab Results  Component Value Date   HGBA1C 5.6 11/06/2014   Lab Results  Component Value Date   TSH 2.21 04/04/2016   Lab Results  Component Value Date   VITAMINB12 1,623  12/10/2016   No results found for: FOLATE Lab Results  Component Value Date   IRON 34 05/27/2017   TIBC 249 (L) 11/10/2014   FERRITIN 65 11/10/2014    Imaging and Procedures obtained prior to SNF admission: No results found.  Assessment/Plan 1. Cerebrovascular accident (CVA) due to stenosis of carotid artery, unspecified blood vessel laterality ([place]) History of left-sided weakness, but moves all extremities  2. Vascular dementia without behavioral disturbance (HCC) There are no behaviors.  Patient is pleasant.  When she speaks one is tempted to think what ever she says it is believable but that in fact is not always the case.  This is attributable to her dementia  3. Edema, unspecified type Ex to her heart or kidney disease.  Hibits no edema today.  She is on torsemide 10 mg.  May be related  4. Malignant neoplasm of upper-outer quadrant of left breast in female, estrogen receptor negative ([place]) Consideration of lumpectomy that decision will be made  by her daughter with surgeon  5. Unsteady gait No reports of recent falls but does depend on wheelchair for ambulation  6. Acute bronchitis, unspecified organism Bronchitis was recently treated and patient seems to be asymptomatic at this time  7. CKD (chronic kidney disease) stage 4, GFR 15-29 ml/min (HCC) Creatinine is slowly rising.  Recommend hydration   Family/ staff Communication: Findings communicated to staff  Labs/tests ordered:  Lillette Boxer. Sabra Heck, [place] [address] [place], [place] Office [phone]

## 2018-01-27 DIAGNOSIS — N184 Chronic kidney disease, stage 4 (severe): Secondary | ICD-10-CM | POA: Diagnosis not present

## 2018-01-27 LAB — BASIC METABOLIC PANEL
BUN: 59 — AB (ref 4–21)
Creatinine: 3 — AB (ref <=1.1)
Glucose: 93
POTASSIUM: 4.2 (ref 3.4–5.3)
SODIUM: 142 (ref 137–147)

## 2018-01-28 ENCOUNTER — Other Ambulatory Visit: Payer: Self-pay | Admitting: *Deleted

## 2018-01-28 LAB — BASIC METABOLIC PANEL
CHLORIDE: 110
Calcium: 9.2
Carbon Dioxide, Total: 22
EGFR (Non-African Amer.): 13

## 2018-02-03 DIAGNOSIS — N184 Chronic kidney disease, stage 4 (severe): Secondary | ICD-10-CM | POA: Diagnosis not present

## 2018-02-03 LAB — BASIC METABOLIC PANEL
BUN: 67 — AB (ref 4–21)
Creatinine: 2.9 — AB (ref <=1.1)
Glucose: 92
Potassium: 4 (ref 3.4–5.3)
SODIUM: 145 (ref 137–147)

## 2018-02-04 ENCOUNTER — Encounter: Payer: Self-pay | Admitting: Nurse Practitioner

## 2018-02-04 ENCOUNTER — Non-Acute Institutional Stay (SKILLED_NURSING_FACILITY): Payer: Medicare Other | Admitting: Nurse Practitioner

## 2018-02-04 ENCOUNTER — Other Ambulatory Visit: Payer: Self-pay | Admitting: *Deleted

## 2018-02-04 DIAGNOSIS — N184 Chronic kidney disease, stage 4 (severe): Secondary | ICD-10-CM

## 2018-02-04 DIAGNOSIS — I10 Essential (primary) hypertension: Secondary | ICD-10-CM

## 2018-02-04 DIAGNOSIS — D638 Anemia in other chronic diseases classified elsewhere: Secondary | ICD-10-CM

## 2018-02-04 DIAGNOSIS — F0150 Vascular dementia without behavioral disturbance: Secondary | ICD-10-CM

## 2018-02-04 DIAGNOSIS — K1120 Sialoadenitis, unspecified: Secondary | ICD-10-CM | POA: Diagnosis not present

## 2018-02-04 LAB — BASIC METABOLIC PANEL
CHLORIDE: 111
Calcium: 9.4
Carbon Dioxide, Total: 22
GFR CALC NON AF AMER: 14

## 2018-02-04 NOTE — Assessment & Plan Note (Signed)
Baseline Hgb 9-10, continue folate, Vit B12, Fe. Update CBC/diff.

## 2018-02-04 NOTE — Assessment & Plan Note (Signed)
Controlled blood pressure, continue Clonidine 0.1mg  bid, Hydralazine 75mg  tid, Carvedilol 25mg  bid

## 2018-02-04 NOTE — Progress Notes (Signed)
Location:  [facility] Room Number: 5 Place of Service:  SNF (31) Provider: Serenity Batley, Lennie Odor  NP  Emori Mumme X, NP  Patient Care Team: Siarah Deleo X, NP as PCP - General (Internal Medicine) Juanita Craver, MD as Consulting Physician (Gastroenterology) Shon Hough, MD as Consulting Physician (Ophthalmology) Thornell Sartorius, MD as Consulting Physician (Otolaryngology) [place] Doretta Remmert X, NP as Nurse Practitioner (Nurse Practitioner) Bo Merino, MD as Consulting Physician (Rheumatology) Suella Broad, MD as Consulting Physician (Physical Medicine and Rehabilitation) Netta Cedars, MD as Consulting Physician (Orthopedic Surgery) Fanny Skates, MD as Consulting Physician (General Surgery) Truitt Merle, MD as Consulting Physician (Hematology) Thea Silversmith, MD as Consulting Physician (Radiation Oncology) Rockwell Germany, RN as Registered Nurse Mauro Kaufmann, RN as Registered Nurse Jake Shark, Johny Blamer, NP as Nurse Practitioner (Nurse Practitioner) [name], MD as Consulting Physician (Cardiology)  Extended Emergency Contact Information Primary Emergency Contact: [name],[name] Address: [address]          [place] [address] [place] Phone: [phone] Mobile Phone: [phone] Relation: Daughter Secondary Emergency Contact: [name],[name]  [place] Mobile Phone: [phone] Relation: None  Code Status:  DNR Goals of care: Advanced Directive information Advanced Directives 01/22/2018  Does Patient Have a Medical Advance Directive? Yes  Type of Advance Directive Out of facility DNR (pink MOST or yellow form)  Does patient want to make changes to medical advance directive? No - Patient declined  Copy of [place] in Chart? -  Pre-existing out of facility DNR order (yellow form or pink MOST form) Pink MOST form placed in chart (order not valid for  inpatient use);Yellow form placed in chart (order not valid for inpatient use)     Chief Complaint  Patient presents with  . Acute Visit    C/o-  (R) side of face swollen.    HPI:  Pt is a 84 y.o. female seen today for an acute visit for sudden onset pain swelling area right jaw under the left ear, warmth and tenderness noted. HPI was provided with assistance of staff. Chronic HTN, controlled on Carvedilol 25mg  bid, Clonidine 0.1mg  bid, Hydralazine 75mg  tid,  CKD, creat 2-3, , anemia, on folate, Fe, Vit B12, last Hgb 9-10.    Past Medical History:  Diagnosis Date  . Arthritis    Osteoarthritis  . Cancer of left breast [facility]) August 2016  . Cerebral atherosclerosis 12/19/2012  . Cerebral atrophy ([place]) 12/19/2012  . Cerebral ischemia 12/19/2012   Microvascular   . CKD (chronic kidney disease) stage 3, GFR 30-59 ml/min (HCC) 11/03/2014  . Cough   . Dementia ([place])   . Diverticulosis of colon (without mention of hemorrhage)   . Edema   . Family history of breast cancer   . Flat feet   . GERD (gastroesophageal reflux disease)   . Glaucoma   . Heart murmur   . Hyperlipidemia   . Hypertension   . Hypertonicity of bladder   . Ischemic cardiomyopathy 11/04/2014  . Lumbago   . Memory change 03/28/2016  . Osteoarthrosis, unspecified whether generalized or localized, unspecified site   . Other malaise and fatigue   . Pain in joint, pelvic region and thigh   . Pain in joint, shoulder region   . Personal history of fall   . RLQ abdominal pain 01/17/2011  . Senile osteoporosis   . Small bowel obstruction ([place])   . STEMI (ST elevation myocardial infarction) ([place]) 11/04/2014  .  Unstable gait 11/04/2014  . Urine, incontinence, stress female 09/18/2014   Past Surgical History:  Procedure Laterality Date  . BREAST LUMPECTOMY Bilateral   . BREAST LUMPECTOMY WITH RADIOACTIVE SEED LOCALIZATION Left 03/15/2015   Procedure: LEFT BREAST LUMPECTOMY WITH RADIOACTIVE SEED LOCALIZATION;  Surgeon:  Fanny Skates, MD;  Location: [facility];  Service: General;  Laterality: Left;  . CARDIAC CATHETERIZATION N/A 11/08/2014   Procedure: Left Heart Cath and Coronary Angiography;  Surgeon: [name], MD;  Location: [facility] CV LAB;  Service: Cardiovascular;  Laterality: N/A;  . COLON SURGERY  02/1996   Colectomy, partial for diverticular bleed 90%  . EYE SURGERY    . JOINT REPLACEMENT Left 12/2000   Left knee; Dr. Wynelle Link  . LAPAROSCOPIC LYSIS OF ADHESIONS  11/15/10   Exploratory  . TONSILLECTOMY      Allergies  Allergen Reactions  . Sulfa Antibiotics Itching    Outpatient Encounter Medications as of 02/04/2018  Medication Sig  . acetaminophen (TYLENOL) 325 MG tablet Take 650 mg by mouth 3 (three) times daily.   Marland Kitchen atorvastatin (LIPITOR) 10 MG tablet Take 10 mg by mouth at bedtime.   . carboxymethylcellulose (REFRESH PLUS) 0.5 % SOLN Apply 2 drops to eye 2 (two) times daily.  . carvedilol (COREG) 25 MG tablet Take 25 mg by mouth 2 (two) times daily with a meal.  . cloNIDine (CATAPRES) 0.1 MG tablet Take 0.1 mg by mouth 2 (two) times daily.  . Coenzyme Q10 50 MG TABS Take one tablet by mouth at bedtime  . famotidine (PEPCID) 10 MG tablet Take 10 mg by mouth at bedtime.   . ferrous sulfate 325 (65 FE) MG tablet Take 325 mg by mouth daily with breakfast.  . folic acid (FOLVITE) 1 MG tablet Take 1 mg by mouth daily.  . hydrALAZINE (APRESOLINE) 25 MG tablet Take 75 mg by mouth 3 (three) times daily.  . isosorbide mononitrate (IMDUR) 60 MG 24 hr tablet Take 90 mg by mouth at bedtime.   Marland Kitchen latanoprost (XALATAN) 0.005 % ophthalmic solution Place 1 drop into both eyes at bedtime.   . mirtazapine (REMERON) 7.5 MG tablet Take 7.5 mg by mouth at bedtime.  . potassium chloride (K-DUR,KLOR-CON) 10 MEQ tablet Take 10 mEq by mouth daily.   . timolol (TIMOPTIC-XR) 0.5 % ophthalmic gel-forming Place 1 drop into both eyes daily.   Marland Kitchen torsemide (DEMADEX) 10 MG tablet Take 10 mg daily by mouth. Hold for SBP  < 110  . vitamin B-12 (CYANOCOBALAMIN) 1000 MCG tablet Take 1,000 mcg See admin instructions by mouth. Take 1000 mcg by mouth daily On Mondays,Wednesdays and Fridays  . Nutritional Supplements (FEEDING SUPPLEMENT, BOOST BREEZE,) LIQD Take by mouth 3 (three) times daily with meals.   No facility-administered encounter medications on file as of 02/04/2018.    ROS was provided with assistance of staff Review of Systems  Constitutional: Negative for activity change, appetite change, chills, diaphoresis, fatigue and fever.  HENT: Positive for facial swelling and hearing loss. Negative for congestion, dental problem, drooling, ear discharge, ear pain, mouth sores, nosebleeds, postnasal drip, rhinorrhea, sinus pressure, sinus pain, sneezing, sore throat, tinnitus, trouble swallowing and voice change.   Respiratory: Negative for cough, shortness of breath and wheezing.   Cardiovascular: Positive for leg swelling. Negative for chest pain.  Gastrointestinal: Negative for abdominal distention, abdominal pain, constipation, diarrhea, nausea and vomiting.  Genitourinary: Negative for difficulty urinating, dysuria and urgency.  Musculoskeletal: Positive for arthralgias, back pain and gait problem. Negative for joint  swelling, neck pain and neck stiffness.  Skin: Negative for color change and pallor.  Neurological: Negative for dizziness, facial asymmetry, speech difficulty, weakness and headaches.       Dementia  Psychiatric/Behavioral: Negative for agitation, behavioral problems, hallucinations and sleep disturbance. The patient is not nervous/anxious.     Immunization History  Administered Date(s) Administered  . Influenza Whole 11/19/2011, 12/02/2012, 11/20/2017  . Influenza-Unspecified 12/06/2013, 12/06/2015, 12/02/2016  . PPD Test 11/10/2014  . Pneumococcal Conjugate-13 03/09/2009  . Pneumococcal Polysaccharide-23 02/18/2009  . Tdap 04/02/2016   Pertinent  Health Maintenance Due  Topic Date Due   . MAMMOGRAM  01/14/2019  . INFLUENZA VACCINE  Completed  . DEXA SCAN  Completed  . PNA vac Low Risk Adult  Completed   Fall Risk  02/05/2017 01/31/2017 07/13/2015 11/17/2014 04/21/2014  Falls in the past year? No No No Yes Yes  Number falls in past yr: - - - 1 1  Injury with Fall? - - - No No  Comment - - - - -  Risk for fall due to : - History of fall(s);Impaired balance/gait;Impaired vision - Impaired balance/gait;History of fall(s) -   Functional Status Survey:    Vitals:   02/04/18 1418  BP: (!) 154/66  Pulse: 80  Resp: (!) 22  Temp: (!) 97.5 F (36.4 C)  SpO2: 96%  Weight: 133 lb 14.4 oz (60.7 kg)  Height: 5\' 5"  (1.651 m)   Body mass index is 22.28 kg/m. Physical Exam Constitutional:      General: She is not in acute distress.    Appearance: Normal appearance. She is normal weight. She is not ill-appearing or diaphoretic.  HENT:     Head: Normocephalic and atraumatic.     Comments: Right jaw, under the right ear, a lump about a golf ball size, feverish and tenderness when palpated.     Right Ear: External ear normal.     Left Ear: External ear normal.     Nose: Nose normal. No congestion or rhinorrhea.     Mouth/Throat:     Mouth: Mucous membranes are moist.     Pharynx: Oropharyngeal exudate and posterior oropharyngeal erythema present.     Comments: Inflamed appearance of tongue.  Eyes:     General: No scleral icterus.       Right eye: No discharge.        Left eye: No discharge.     Extraocular Movements: Extraocular movements intact.     Pupils: Pupils are equal, round, and reactive to light.  Neck:     Musculoskeletal: Neck supple. No neck rigidity or muscular tenderness.  Cardiovascular:     Rate and Rhythm: Normal rate and regular rhythm.     Heart sounds: Murmur present.  Pulmonary:     Effort: Pulmonary effort is normal.     Breath sounds: No wheezing, rhonchi or rales.  Abdominal:     General: There is no distension.     Palpations: Abdomen is  soft.     Tenderness: There is no abdominal tenderness. There is no guarding or rebound.  Musculoskeletal:     Right lower leg: Edema present.     Left lower leg: Edema present.     Comments: Trace edema BLE. Needs assistance for transfer. W/c for mobility.  Lymphadenopathy:     Cervical: No cervical adenopathy.  Skin:    General: Skin is warm and dry.     Coloration: Skin is not jaundiced or pale.     Findings:  No bruising, erythema or rash.  Neurological:     General: No focal deficit present.     Mental Status: She is alert.     Cranial Nerves: No cranial nerve deficit.     Motor: No weakness.     Coordination: Coordination normal.     Gait: Gait abnormal.     Comments: Oriented to person and place.   Psychiatric:        Mood and Affect: Mood normal.        Behavior: Behavior normal.     Labs reviewed: Recent Labs    06/30/17 0820  09/29/17 0836  12/29/17 1004 01/27/18 02/03/18  NA 140   < > 141   < > 138 142 145  K 3.7   < > 3.9   < > 3.6 4.2 4.0  CL 112*   < > 109   < > 107 110 111  CO2 19*   < > 21*   < > 22 22 22   GLUCOSE 141*  --  105*  --  117*  --   --   BUN 26   < > 28*   < > 25* 59* 67*  CREATININE 2.19*   < > 2.14*   < > 2.76* 3.0* 2.9*  CALCIUM 8.8   < > 9.3   < > 8.8* 9.2 9.4   < > = values in this interval not displayed.   Recent Labs    06/30/17 0820  09/29/17 0836 11/20/17 12/29/17 1004  AST 12   < > 16 16 11*  ALT 9   < > 9 8 7   ALKPHOS 46   < > 52 41 45  BILITOT 0.4  --  0.8  --  0.5  PROT 6.9   < > 7.5 5.7 6.7  ALBUMIN 3.9   < > 4.2 3.4 3.2*   < > = values in this interval not displayed.   Recent Labs    06/30/17 0820  09/29/17 0836  12/02/17 12/29/17 1004 12/30/17  WBC 6.0   < > 7.7   < > 4.6 7.5 8.3  NEUTROABS 4.1  --  5.8  --   --  5.6  --   HGB 10.6*   < > 11.6   < > 8.5* 9.5* 9.5*  HCT 34.0*   < > 37.1   < > 26* 30.0* 29*  MCV 91.2  --  94.2  --   --  94.6  --   PLT 208   < > 214   < > 167 197 211   < > = values in this  interval not displayed.   Lab Results  Component Value Date   TSH 2.21 04/04/2016   Lab Results  Component Value Date   HGBA1C 5.6 11/06/2014   Lab Results  Component Value Date   CHOL 113 11/04/2017   HDL 40 11/04/2017   LDLCALC 50 11/04/2017   TRIG 150 11/04/2017   CHOLHDL 3.1 11/06/2014    Significant Diagnostic Results in last 30 days:  No results found.  Assessment/Plan Parotiditis The right side, will treat with Augmentin 875mg  q12h x 7 days, encourage oral hydration. Observe. Update CBC/diff  Hypertension Controlled blood pressure, continue Clonidine 0.1mg  bid, Hydralazine 75mg  tid, Carvedilol 25mg  bid  Vascular dementia Continue SNF [facility] for safety and care assistance.   CKD (chronic kidney disease) stage 4, GFR 15-29 ml/min (HCC) Baseline creat 2-3, encourage oral hydration.   Anemia of chronic disease Baseline  Hgb 9-10, continue folate, Vit B12, Fe. Update CBC/diff.      Family/ staff Communication: plan of care reviewed with the patient and charge nurse.   Labs/tests ordered: CBC/diff  Time spend 25 minutes.

## 2018-02-04 NOTE — Assessment & Plan Note (Signed)
Baseline creat 2-3, encourage oral hydration.

## 2018-02-04 NOTE — Assessment & Plan Note (Addendum)
The right side, will treat with Augmentin 875mg  q12h x 7 days, encourage oral hydration. Observe. Update CBC/diff

## 2018-02-04 NOTE — Assessment & Plan Note (Signed)
Continue SNF [facility] for safety and care assistance.  

## 2018-02-05 DIAGNOSIS — K1120 Sialoadenitis, unspecified: Secondary | ICD-10-CM | POA: Diagnosis not present

## 2018-02-06 ENCOUNTER — Encounter: Payer: Self-pay | Admitting: Nurse Practitioner

## 2018-02-12 DIAGNOSIS — K1120 Sialoadenitis, unspecified: Secondary | ICD-10-CM | POA: Diagnosis not present

## 2018-02-12 LAB — CBC AND DIFFERENTIAL
HCT: 25 — AB (ref 36–46)
Hemoglobin: 7.9 — AB (ref 12.0–16.0)
Platelets: 269 (ref 150–399)
WBC: 11.7

## 2018-02-13 ENCOUNTER — Other Ambulatory Visit: Payer: Self-pay | Admitting: *Deleted

## 2018-02-19 DIAGNOSIS — D638 Anemia in other chronic diseases classified elsewhere: Secondary | ICD-10-CM | POA: Diagnosis not present

## 2018-02-19 LAB — CBC AND DIFFERENTIAL
HCT: 24 — AB (ref 36–46)
Hemoglobin: 7.6 — AB (ref 12.0–16.0)
PLATELETS: 267 (ref 150–399)
WBC: 9.3

## 2018-02-20 ENCOUNTER — Encounter: Payer: Self-pay | Admitting: Nurse Practitioner

## 2018-02-20 ENCOUNTER — Non-Acute Institutional Stay (SKILLED_NURSING_FACILITY): Payer: Medicare Other | Admitting: Nurse Practitioner

## 2018-02-20 DIAGNOSIS — I110 Hypertensive heart disease with heart failure: Secondary | ICD-10-CM | POA: Diagnosis not present

## 2018-02-20 DIAGNOSIS — M159 Polyosteoarthritis, unspecified: Secondary | ICD-10-CM

## 2018-02-20 DIAGNOSIS — N184 Chronic kidney disease, stage 4 (severe): Secondary | ICD-10-CM

## 2018-02-20 DIAGNOSIS — D638 Anemia in other chronic diseases classified elsewhere: Secondary | ICD-10-CM

## 2018-02-20 DIAGNOSIS — K219 Gastro-esophageal reflux disease without esophagitis: Secondary | ICD-10-CM

## 2018-02-20 DIAGNOSIS — I10 Essential (primary) hypertension: Secondary | ICD-10-CM | POA: Diagnosis not present

## 2018-02-20 DIAGNOSIS — F411 Generalized anxiety disorder: Secondary | ICD-10-CM

## 2018-02-20 DIAGNOSIS — I5022 Chronic systolic (congestive) heart failure: Secondary | ICD-10-CM

## 2018-02-20 NOTE — Assessment & Plan Note (Signed)
Her mood is stable, continue Mirtazapine 7.5mg  qd.

## 2018-02-20 NOTE — Assessment & Plan Note (Signed)
elevated Sbp is chronic, asymptomatic presently, conitnue Clonidine 0.1mg  bid, Hydralazine 75mg  tid, Coreg 25mg  bid.

## 2018-02-20 NOTE — Assessment & Plan Note (Addendum)
Chronic, baseline creat 2-3, nephrology consultation if POA desires.

## 2018-02-20 NOTE — Assessment & Plan Note (Signed)
Stable, continue Famotidine 10mg  qd.

## 2018-02-20 NOTE — Assessment & Plan Note (Signed)
Stable, continue Tylenol 650mg  tid, w/c for mobility.

## 2018-02-20 NOTE — Progress Notes (Addendum)
Location:  [place] Room Number: 5 Place of Service:  SNF (31) Provider:  Rashelle Ireland, Lennie Odor  NP  Keirra Zeimet X, NP  Patient Care Team: Fitzhugh Vizcarrondo X, NP as PCP - General (Internal Medicine) Juanita Craver, MD as Consulting Physician (Gastroenterology) Shon Hough, MD as Consulting Physician (Ophthalmology) Thornell Sartorius, MD as Consulting Physician (Otolaryngology) [place] Keymani Glynn X, NP as Nurse Practitioner (Nurse Practitioner) Bo Merino, MD as Consulting Physician (Rheumatology) Suella Broad, MD as Consulting Physician (Physical Medicine and Rehabilitation) Netta Cedars, MD as Consulting Physician (Orthopedic Surgery) Fanny Skates, MD as Consulting Physician (General Surgery) Truitt Merle, MD as Consulting Physician (Hematology) Thea Silversmith, MD as Consulting Physician (Radiation Oncology) Rockwell Germany, RN as Registered Nurse Mauro Kaufmann, RN as Registered Nurse Jake Shark, Johny Blamer, NP as Nurse Practitioner (Nurse Practitioner) [name], MD as Consulting Physician (Cardiology)  Extended Emergency Contact Information Primary Emergency Contact: [name],[name] Address: [address]          [place] [address] [place] of [place] Phone: [phone] Mobile Phone: [phone] Relation: Daughter Secondary Emergency Contact: [name],[name]  [place] Mobile Phone: [phone] Relation: None  Code Status:  DNR Goals of care: Advanced Directive information Advanced Directives 02/20/2018  Does Patient Have a Medical Advance Directive? Yes  Type of Advance Directive Out of facility DNR (pink MOST or yellow form)  Does patient want to make changes to medical advance directive? No - Patient declined  Copy of [place] in Chart? -  Pre-existing out of facility DNR order (yellow form or pink MOST form) Yellow form placed in chart (order not valid for inpatient  use);Pink MOST form placed in chart (order not valid for inpatient use)     Chief Complaint  Patient presents with  . Medical Management of Chronic Issues    HPI:  Pt is a 85 y.o. female seen today for medical management of chronic diseases.     The patient has history of anemia, Hgb continue to drop to 7.6, no s/s of bleeding clinically. She is in her usual state of health, denied dizziness, headache, chest pain/pressure, palpitation, or SOB. No O2 desaturation. Iron on hold for pending Guaiac stools, on Vit 12, Folate. Hx of HTN, elevated Sbp is chronic, asymptomatic presently, on Clonidine 0.1mg  bid, Hydralazine 75mg  tid, Coreg 25mg  bid. CHF compensated clinically on Torsemide 10mg  qd. Her mood is stable on Mirtazapine 7.5mg  qd. GERD stable on Famotidine 10mg  qd. OA pain is stable on Tylenol 650mg  tid.  Past Medical History:  Diagnosis Date  . Arthritis    Osteoarthritis  . Cancer of left breast [facility]) August 2016  . Cerebral atherosclerosis 12/19/2012  . Cerebral atrophy ([place]) 12/19/2012  . Cerebral ischemia 12/19/2012   Microvascular   . CKD (chronic kidney disease) stage 3, GFR 30-59 ml/min (HCC) 11/03/2014  . Cough   . Dementia ([place])   . Diverticulosis of colon (without mention of hemorrhage)   . Edema   . Family history of breast cancer   . Flat feet   . GERD (gastroesophageal reflux disease)   . Glaucoma   . Heart murmur   . Hyperlipidemia   . Hypertension   . Hypertonicity of bladder   . Ischemic cardiomyopathy 11/04/2014  . Lumbago   . Memory change 03/28/2016  . Osteoarthrosis, unspecified whether generalized or localized, unspecified site   . Other malaise and fatigue   . Pain in joint, pelvic region and thigh   .  Pain in joint, shoulder region   . Personal history of fall   . RLQ abdominal pain 01/17/2011  . Senile osteoporosis   . Small bowel obstruction ([place])   . STEMI (ST elevation myocardial infarction) ([place]) 11/04/2014  . Unstable gait 11/04/2014  . Urine,  incontinence, stress female 09/18/2014   Past Surgical History:  Procedure Laterality Date  . BREAST LUMPECTOMY Bilateral   . BREAST LUMPECTOMY WITH RADIOACTIVE SEED LOCALIZATION Left 03/15/2015   Procedure: LEFT BREAST LUMPECTOMY WITH RADIOACTIVE SEED LOCALIZATION;  Surgeon: Fanny Skates, MD;  Location: [facility];  Service: General;  Laterality: Left;  . CARDIAC CATHETERIZATION N/A 11/08/2014   Procedure: Left Heart Cath and Coronary Angiography;  Surgeon: [name], MD;  Location: [place] CV LAB;  Service: Cardiovascular;  Laterality: N/A;  . COLON SURGERY  02/1996   Colectomy, partial for diverticular bleed 90%  . EYE SURGERY    . JOINT REPLACEMENT Left 12/2000   Left knee; Dr. Wynelle Link  . LAPAROSCOPIC LYSIS OF ADHESIONS  11/15/10   Exploratory  . TONSILLECTOMY      Allergies  Allergen Reactions  . Sulfa Antibiotics Itching    Outpatient Encounter Medications as of 02/20/2018  Medication Sig  . acetaminophen (TYLENOL) 325 MG tablet Take 650 mg by mouth 3 (three) times daily.   Marland Kitchen atorvastatin (LIPITOR) 10 MG tablet Take 10 mg by mouth at bedtime.   . carboxymethylcellulose (REFRESH PLUS) 0.5 % SOLN Apply 2 drops to eye 2 (two) times daily.  . carvedilol (COREG) 25 MG tablet Take 25 mg by mouth 2 (two) times daily with a meal.  . cloNIDine (CATAPRES) 0.1 MG tablet Take 0.1 mg by mouth 2 (two) times daily.  . Coenzyme Q10 50 MG TABS Take one tablet by mouth at bedtime  . famotidine (PEPCID) 10 MG tablet Take 10 mg by mouth at bedtime.   . folic acid (FOLVITE) 1 MG tablet Take 1 mg by mouth daily.  . hydrALAZINE (APRESOLINE) 25 MG tablet Take 75 mg by mouth 3 (three) times daily.  . isosorbide mononitrate (IMDUR) 60 MG 24 hr tablet Take 90 mg by mouth at bedtime.   Marland Kitchen latanoprost (XALATAN) 0.005 % ophthalmic solution Place 1 drop into both eyes at bedtime.   . mirtazapine (REMERON) 7.5 MG tablet Take 7.5 mg by mouth at bedtime.  . Nutritional Supplements (FEEDING SUPPLEMENT, BOOST  BREEZE,) LIQD Take 180 mLs by mouth 3 (three) times daily with meals.   . potassium chloride (K-DUR,KLOR-CON) 10 MEQ tablet Take 10 mEq by mouth daily.   . timolol (TIMOPTIC-XR) 0.5 % ophthalmic gel-forming Place 1 drop into both eyes daily.   Marland Kitchen torsemide (DEMADEX) 10 MG tablet Take 10 mg daily by mouth. Hold for SBP < 110  . vitamin B-12 (CYANOCOBALAMIN) 1000 MCG tablet Take 1,000 mcg See admin instructions by mouth. Take 1000 mcg by mouth daily On Mondays,Wednesdays and Fridays  . [DISCONTINUED] ferrous sulfate 325 (65 FE) MG tablet Take 325 mg by mouth daily with breakfast.   No facility-administered encounter medications on file as of 02/20/2018.    ROS was provided with assistance of staff Review of Systems  Constitutional: Negative for activity change, appetite change, chills, diaphoresis, fatigue, fever and unexpected weight change.  HENT: Positive for hearing loss. Negative for congestion and voice change.   Respiratory: Negative for cough, shortness of breath and wheezing.   Cardiovascular: Positive for leg swelling. Negative for chest pain and palpitations.  Gastrointestinal: Negative for abdominal distention, abdominal pain, blood in  stool, constipation, diarrhea, nausea and vomiting.  Genitourinary: Negative for difficulty urinating, dysuria and urgency.  Musculoskeletal: Positive for arthralgias, back pain and gait problem.  Neurological: Negative for dizziness, speech difficulty, weakness and headaches.       Memory lapses.   Psychiatric/Behavioral: Negative for agitation, behavioral problems, hallucinations and sleep disturbance. The patient is not nervous/anxious.     Immunization History  Administered Date(s) Administered  . Influenza Whole 11/19/2011, 12/02/2012, 11/20/2017  . Influenza-Unspecified 12/06/2013, 12/06/2015, 12/02/2016  . PPD Test 11/10/2014  . Pneumococcal Conjugate-13 03/09/2009  . Pneumococcal Polysaccharide-23 02/18/2009  . Tdap 04/02/2016   Pertinent   Health Maintenance Due  Topic Date Due  . MAMMOGRAM  01/14/2019  . INFLUENZA VACCINE  Completed  . DEXA SCAN  Completed  . PNA vac Low Risk Adult  Completed   Fall Risk  02/05/2017 01/31/2017 07/13/2015 11/17/2014 04/21/2014  Falls in the past year? No No No Yes Yes  Number falls in past yr: - - - 1 1  Injury with Fall? - - - No No  Comment - - - - -  Risk for fall due to : - History of fall(s);Impaired balance/gait;Impaired vision - Impaired balance/gait;History of fall(s) -   Functional Status Survey:    Vitals:   02/20/18 1051  BP: (!) 172/64  Pulse: 72  Resp: 20  Temp: (!) 97.1 F (36.2 C)  SpO2: 95%  Weight: 140 lb 6.4 oz (63.7 kg)  Height: 5\' 5"  (1.651 m)   Body mass index is 23.36 kg/m. Physical Exam Constitutional:      General: She is not in acute distress.    Appearance: Normal appearance. She is normal weight. She is not ill-appearing or diaphoretic.  HENT:     Head: Normocephalic and atraumatic.     Nose: Nose normal.     Mouth/Throat:     Mouth: Mucous membranes are moist.  Eyes:     Extraocular Movements: Extraocular movements intact.     Pupils: Pupils are equal, round, and reactive to light.  Neck:     Musculoskeletal: Normal range of motion and neck supple.  Cardiovascular:     Rate and Rhythm: Normal rate and regular rhythm.     Heart sounds: Murmur present.  Pulmonary:     Breath sounds: Normal breath sounds. No wheezing, rhonchi or rales.  Abdominal:     General: There is no distension.     Palpations: Abdomen is soft.     Tenderness: There is no abdominal tenderness. There is no guarding or rebound.  Musculoskeletal:     Right lower leg: Edema present.     Left lower leg: Edema present.     Comments: Trace edema BLE. W/c for mobility.   Skin:    General: Skin is warm and dry.     Coloration: Skin is pale.     Findings: No erythema or rash.  Neurological:     General: No focal deficit present.     Mental Status: She is alert. Mental  status is at baseline.     Motor: No weakness.     Coordination: Coordination normal.     Gait: Gait abnormal.     Comments: Oriented to person and place.   Psychiatric:        Mood and Affect: Mood normal.        Behavior: Behavior normal.     Labs reviewed: Recent Labs    06/30/17 0820  09/29/17 0836  12/29/17 1004 01/27/18 02/03/18  NA  140   < > 141   < > 138 142 145  K 3.7   < > 3.9   < > 3.6 4.2 4.0  CL 112*   < > 109   < > 107 110 111  CO2 19*   < > 21*   < > 22 22 22   GLUCOSE 141*  --  105*  --  117*  --   --   BUN 26   < > 28*   < > 25* 59* 67*  CREATININE 2.19*   < > 2.14*   < > 2.76* 3.0* 2.9*  CALCIUM 8.8   < > 9.3   < > 8.8* 9.2 9.4   < > = values in this interval not displayed.   Recent Labs    06/30/17 0820  09/29/17 0836 11/20/17 12/29/17 1004  AST 12   < > 16 16 11*  ALT 9   < > 9 8 7   ALKPHOS 46   < > 52 41 45  BILITOT 0.4  --  0.8  --  0.5  PROT 6.9   < > 7.5 5.7 6.7  ALBUMIN 3.9   < > 4.2 3.4 3.2*   < > = values in this interval not displayed.   Recent Labs    06/30/17 0820  09/29/17 0836  12/29/17 1004 12/30/17 02/12/18 02/19/18  WBC 6.0   < > 7.7   < > 7.5 8.3 11.7 9.3  NEUTROABS 4.1  --  5.8  --  5.6  --   --   --   HGB 10.6*   < > 11.6   < > 9.5* 9.5* 7.9* 7.6*  HCT 34.0*   < > 37.1   < > 30.0* 29* 25* 24*  MCV 91.2  --  94.2  --  94.6  --   --   --   PLT 208   < > 214   < > 197 211 269 267   < > = values in this interval not displayed.   Lab Results  Component Value Date   TSH 2.21 04/04/2016   Lab Results  Component Value Date   HGBA1C 5.6 11/06/2014   Lab Results  Component Value Date   CHOL 113 11/04/2017   HDL 40 11/04/2017   LDLCALC 50 11/04/2017   TRIG 150 11/04/2017   CHOLHDL 3.1 11/06/2014    Significant Diagnostic Results in last 30 days:  No results found.  Assessment/Plan Accelerated hypertension elevated Sbp is chronic, asymptomatic presently, conitnue Clonidine 0.1mg  bid, Hydralazine 75mg  tid, Coreg 25mg   bid.   Hypertensive heart disease with chronic systolic congestive heart failure ([place]) compensated clinically on Torsemide 10mg  qd.   GERD (gastroesophageal reflux disease) Stable, continue Famotidine 10mg  qd.   Generalized osteoarthritis Stable, continue Tylenol 650mg  tid, w/c for mobility.  CKD (chronic kidney disease) stage 4, GFR 15-29 ml/min (HCC) Chronic, baseline creat 2-3, nephrology consultation if POA desires.   Anemia of chronic disease Hgb continue to drop to 7.6, no s/s of bleeding clinically. She is in her usual state of health, denied dizziness, headache, chest pain/pressure, palpitation, or SOB. No O2 desaturation. Iron on hold for pending Guaiac stools, continue Vit 12, Folate. F/u CBC, Erythropoietin. Will have nephrology consultation, GI consultation if FOBT positive, ED if Hgb <7 02/26/18 wbc 5.3, Hgb 7.5, plt 235, no apparent s/s of bleeding. Will refer to hematology/oncology for further evaluation.   Anxiety state Her mood is stable, continue Mirtazapine 7.5mg  qd.  Family/ staff Communication: plan of care reviewed with the patient and charge nurse.   Labs/tests ordered:  CBC EPO  Time spend 25 minutes.

## 2018-02-20 NOTE — Assessment & Plan Note (Signed)
compensated clinically on Torsemide 10mg  qd.

## 2018-02-20 NOTE — Assessment & Plan Note (Addendum)
Hgb continue to drop to 7.6, no s/s of bleeding clinically. She is in her usual state of health, denied dizziness, headache, chest pain/pressure, palpitation, or SOB. No O2 desaturation. Iron on hold for pending Guaiac stools, continue Vit 12, Folate. F/u CBC, Erythropoietin. Will have nephrology consultation, GI consultation if FOBT positive, ED if Hgb <7 02/26/18 wbc 5.3, Hgb 7.5, plt 235, no apparent s/s of bleeding. Will refer to hematology/oncology for further evaluation.

## 2018-02-24 DIAGNOSIS — D638 Anemia in other chronic diseases classified elsewhere: Secondary | ICD-10-CM | POA: Diagnosis not present

## 2018-03-03 ENCOUNTER — Non-Acute Institutional Stay (SKILLED_NURSING_FACILITY): Payer: Medicare Other | Admitting: Internal Medicine

## 2018-03-03 ENCOUNTER — Encounter: Payer: Self-pay | Admitting: Internal Medicine

## 2018-03-03 DIAGNOSIS — D638 Anemia in other chronic diseases classified elsewhere: Secondary | ICD-10-CM

## 2018-03-03 DIAGNOSIS — N184 Chronic kidney disease, stage 4 (severe): Secondary | ICD-10-CM

## 2018-03-03 DIAGNOSIS — F0150 Vascular dementia without behavioral disturbance: Secondary | ICD-10-CM | POA: Diagnosis not present

## 2018-03-03 LAB — CBC AND DIFFERENTIAL
HCT: 25 — AB (ref 36–46)
Hemoglobin: 7.9 — AB (ref 12.0–16.0)
Platelets: 186 (ref 150–399)
WBC: 5.9

## 2018-03-03 NOTE — Progress Notes (Signed)
Location:  [facility] Room Number: 5 Place of Service:  SNF [phone]) Provider:  Veleta Miners MD  Mast, Man X, NP  Patient Care Team: Mast, Man X, NP as PCP - General (Internal Medicine) Juanita Craver, MD as Consulting Physician (Gastroenterology) Shon Hough, MD as Consulting Physician (Ophthalmology) Thornell Sartorius, MD as Consulting Physician (Otolaryngology) [place] Mast, Man X, NP as Nurse Practitioner (Nurse Practitioner) Bo Merino, MD as Consulting Physician (Rheumatology) Suella Broad, MD as Consulting Physician (Physical Medicine and Rehabilitation) Netta Cedars, MD as Consulting Physician (Orthopedic Surgery) Fanny Skates, MD as Consulting Physician (General Surgery) Truitt Merle, MD as Consulting Physician (Hematology) Thea Silversmith, MD as Consulting Physician (Radiation Oncology) Rockwell Germany, RN as Registered Nurse Mauro Kaufmann, RN as Registered Nurse Jake Shark, Johny Blamer, NP as Nurse Practitioner (Nurse Practitioner) [name], MD as Consulting Physician (Cardiology)  Extended Emergency Contact Information Primary Emergency Contact: [name],[name] Address: [address]          [place] [address] [place] Phone: [phone] Mobile Phone: [phone] Relation: Daughter Secondary Emergency Contact: [name],[name]  [place] Mobile Phone: [phone] Relation: None  Code Status:  DNR Goals of care: Advanced Directive information Advanced Directives 02/20/2018  Does Patient Have a Medical Advance Directive? Yes  Type of Advance Directive Out of facility DNR (pink MOST or yellow form)  Does patient want to make changes to medical advance directive? No - Patient declined  Copy of [place] in Chart? -  Pre-existing out of facility DNR order (yellow form or pink MOST form) Yellow form placed in chart (order not valid for inpatient  use);Pink MOST form placed in chart (order not valid for inpatient use)     Chief Complaint  Patient presents with  . Acute Visit    anemia    HPI:  Pt is a 85 y.o. female seen today for an acute visit for Anemia Patient has h/o Hypertension, H/o Bilateral Breast Cancer with s/p Left Breast lumpectomy in 01/17, Right Breast Mass, Lumpectomy not done due to high Risk, S/P Palliative  Radiation . She also has h/o CKD stage 4, Anemia , CVA, Dementia Patient was seen today for Anemia. Patient is unable to give any history due to her dementia. Her Hgb had dropped from 9.5 to 7.6 with no overt signs of Bleeding. She continues to be asymptomatic with no Chest pain or Sob or Weakness. She has been Occult negative so far.  Patient is in Wheelchair and sometimes mumbles and sometimes answers appropriately. Past Medical History:  Diagnosis Date  . Arthritis    Osteoarthritis  . Cancer of left breast [facility]) August 2016  . Cerebral atherosclerosis 12/19/2012  . Cerebral atrophy ([place]) 12/19/2012  . Cerebral ischemia 12/19/2012   Microvascular   . CKD (chronic kidney disease) stage 3, GFR 30-59 ml/min (HCC) 11/03/2014  . Cough   . Dementia ([place])   . Diverticulosis of colon (without mention of hemorrhage)   . Edema   . Family history of breast cancer   . Flat feet   . GERD (gastroesophageal reflux disease)   . Glaucoma   . Heart murmur   . Hyperlipidemia   . Hypertension   . Hypertonicity of bladder   . Ischemic cardiomyopathy 11/04/2014  . Lumbago   . Memory change 03/28/2016  . Osteoarthrosis, unspecified whether generalized or localized, unspecified site   . Other malaise and fatigue   . Pain in joint, pelvic  region and thigh   . Pain in joint, shoulder region   . Personal history of fall   . RLQ abdominal pain 01/17/2011  . Senile osteoporosis   . Small bowel obstruction ([place])   . STEMI (ST elevation myocardial infarction) ([place]) 11/04/2014  . Unstable gait 11/04/2014  . Urine,  incontinence, stress female 09/18/2014   Past Surgical History:  Procedure Laterality Date  . BREAST LUMPECTOMY Bilateral   . BREAST LUMPECTOMY WITH RADIOACTIVE SEED LOCALIZATION Left 03/15/2015   Procedure: LEFT BREAST LUMPECTOMY WITH RADIOACTIVE SEED LOCALIZATION;  Surgeon: Fanny Skates, MD;  Location: [facility];  Service: General;  Laterality: Left;  . CARDIAC CATHETERIZATION N/A 11/08/2014   Procedure: Left Heart Cath and Coronary Angiography;  Surgeon: [name], MD;  Location: [facility] CV LAB;  Service: Cardiovascular;  Laterality: N/A;  . COLON SURGERY  02/1996   Colectomy, partial for diverticular bleed 90%  . EYE SURGERY    . JOINT REPLACEMENT Left 12/2000   Left knee; Dr. Wynelle Link  . LAPAROSCOPIC LYSIS OF ADHESIONS  11/15/10   Exploratory  . TONSILLECTOMY      Allergies  Allergen Reactions  . Sulfa Antibiotics Itching    Outpatient Encounter Medications as of 03/03/2018  Medication Sig  . acetaminophen (TYLENOL) 325 MG tablet Take 650 mg by mouth 3 (three) times daily.   Marland Kitchen atorvastatin (LIPITOR) 10 MG tablet Take 10 mg by mouth at bedtime.   . carboxymethylcellulose (REFRESH PLUS) 0.5 % SOLN Apply 2 drops to eye 2 (two) times daily.  . carvedilol (COREG) 25 MG tablet Take 25 mg by mouth 2 (two) times daily with a meal.  . cloNIDine (CATAPRES) 0.1 MG tablet Take 0.1 mg by mouth 2 (two) times daily.  . Coenzyme Q10 50 MG TABS Take one tablet by mouth at bedtime  . famotidine (PEPCID) 10 MG tablet Take 10 mg by mouth at bedtime.   . hydrALAZINE (APRESOLINE) 25 MG tablet Take 75 mg by mouth 3 (three) times daily.  . isosorbide mononitrate (IMDUR) 60 MG 24 hr tablet Take 90 mg by mouth at bedtime.   Marland Kitchen latanoprost (XALATAN) 0.005 % ophthalmic solution Place 1 drop into both eyes at bedtime.   . mirtazapine (REMERON) 7.5 MG tablet Take 7.5 mg by mouth at bedtime.  . potassium chloride (K-DUR,KLOR-CON) 10 MEQ tablet Take 10 mEq by mouth daily.   . timolol (TIMOPTIC-XR) 0.5 %  ophthalmic gel-forming Place 1 drop into both eyes daily.   Marland Kitchen torsemide (DEMADEX) 10 MG tablet Take 10 mg daily by mouth. Hold for SBP < 110  . vitamin B-12 (CYANOCOBALAMIN) 1000 MCG tablet Take 1,000 mcg See admin instructions by mouth. Take 1000 mcg by mouth daily On Mondays,Wednesdays and Fridays  . folic acid (FOLVITE) 1 MG tablet Take 1 mg by mouth daily.  . [DISCONTINUED] Nutritional Supplements (FEEDING SUPPLEMENT, BOOST BREEZE,) LIQD Take 180 mLs by mouth 3 (three) times daily with meals.    No facility-administered encounter medications on file as of 03/03/2018.     Review of Systems  Unable to perform ROS: Dementia    Immunization History  Administered Date(s) Administered  . Influenza Whole 11/19/2011, 12/02/2012, 11/20/2017  . Influenza-Unspecified 12/06/2013, 12/06/2015, 12/02/2016  . PPD Test 11/10/2014  . Pneumococcal Conjugate-13 03/09/2009  . Pneumococcal Polysaccharide-23 02/18/2009  . Tdap 04/02/2016   Pertinent  Health Maintenance Due  Topic Date Due  . MAMMOGRAM  01/14/2019  . INFLUENZA VACCINE  Completed  . DEXA SCAN  Completed  . PNA vac  Low Risk Adult  Completed   Fall Risk  02/05/2017 01/31/2017 07/13/2015 11/17/2014 04/21/2014  Falls in the past year? No No No Yes Yes  Number falls in past yr: - - - 1 1  Injury with Fall? - - - No No  Comment - - - - -  Risk for fall due to : - History of fall(s);Impaired balance/gait;Impaired vision - Impaired balance/gait;History of fall(s) -   Functional Status Survey:    Vitals:   03/03/18 1112  BP: (!) 146/70  Pulse: 77  Resp: 20  Temp: (!) 97.1 F (36.2 C)  SpO2: 95%  Weight: 140 lb 1.6 oz (63.5 kg)  Height: 5\' 5"  (3.846 m)   Body mass index is 23.31 kg/m. Physical Exam Constitutional:      Appearance: Normal appearance.  HENT:     Head: Normocephalic.     Nose: Nose normal.     Mouth/Throat:     Mouth: Mucous membranes are moist.     Pharynx: Oropharynx is clear.  Eyes:     Pupils: Pupils are  equal, round, and reactive to light.  Neck:     Musculoskeletal: Neck supple.  Cardiovascular:     Rate and Rhythm: Normal rate and regular rhythm.     Heart sounds: Murmur present.  Pulmonary:     Effort: Pulmonary effort is normal. No respiratory distress.     Breath sounds: Normal breath sounds. No wheezing.  Abdominal:     General: Abdomen is flat. Bowel sounds are normal.     Palpations: Abdomen is soft.  Musculoskeletal:     Comments: Moderate edema Bilateral  Skin:    General: Skin is warm and dry.  Neurological:     General: No focal deficit present.     Mental Status: She is alert.  Psychiatric:        Mood and Affect: Mood normal.        Thought Content: Thought content normal.        Judgment: Judgment normal.     Labs reviewed: Recent Labs    06/30/17 0820  09/29/17 0836  12/29/17 1004 01/27/18 02/03/18  NA 140   < > 141   < > 138 142 145  K 3.7   < > 3.9   < > 3.6 4.2 4.0  CL 112*   < > 109   < > 107 110 111  CO2 19*   < > 21*   < > 22 22 22   GLUCOSE 141*  --  105*  --  117*  --   --   BUN 26   < > 28*   < > 25* 59* 67*  CREATININE 2.19*   < > 2.14*   < > 2.76* 3.0* 2.9*  CALCIUM 8.8   < > 9.3   < > 8.8* 9.2 9.4   < > = values in this interval not displayed.   Recent Labs    06/30/17 0820  09/29/17 0836 11/20/17 12/29/17 1004  AST 12   < > 16 16 11*  ALT 9   < > 9 8 7   ALKPHOS 46   < > 52 41 45  BILITOT 0.4  --  0.8  --  0.5  PROT 6.9   < > 7.5 5.7 6.7  ALBUMIN 3.9   < > 4.2 3.4 3.2*   < > = values in this interval not displayed.   Recent Labs    06/30/17 0820  09/29/17 6599  12/29/17 1004  02/12/18 02/19/18 03/03/18  WBC 6.0   < > 7.7   < > 7.5   < > 11.7 9.3 5.9  NEUTROABS 4.1  --  5.8  --  5.6  --   --   --   --   HGB 10.6*   < > 11.6   < > 9.5*   < > 7.9* 7.6* 7.9*  HCT 34.0*   < > 37.1   < > 30.0*   < > 25* 24* 25*  MCV 91.2  --  94.2  --  94.6  --   --   --   --   PLT 208   < > 214   < > 197   < > 269 267 186   < > = values in this  interval not displayed.   Lab Results  Component Value Date   TSH 2.21 04/04/2016   Lab Results  Component Value Date   HGBA1C 5.6 11/06/2014   Lab Results  Component Value Date   CHOL 113 11/04/2017   HDL 40 11/04/2017   LDLCALC 50 11/04/2017   TRIG 150 11/04/2017   CHOLHDL 3.1 11/06/2014    Significant Diagnostic Results in last 30 days:  No results found.  Assessment/Plan Anemia With no Signs of Bleeding. Hgb has stabilized. Will start her on Iron. Also Repeat Hgb in 3 weeks with iron studies. I think her Anemia is reflection of her Chronic Disease and CKD Patient Daughter has agreed for Renal Consult She will probably need EPO injections CKD With mild recent worsening of renal function Renal Consult pending Breast Cancer Patient follows with Oncology Family/ staff Communication:   Labs/tests ordered:   Total time spent in this patient care encounter was 45_ minutes; greater than 50% of the visit spent counseling patient, reviewing records , Labs and coordinating care for problems addressed at this encounter.

## 2018-03-10 ENCOUNTER — Telehealth: Payer: Self-pay | Admitting: Hematology

## 2018-03-10 ENCOUNTER — Other Ambulatory Visit: Payer: Self-pay

## 2018-03-10 DIAGNOSIS — C50111 Malignant neoplasm of central portion of right female breast: Secondary | ICD-10-CM

## 2018-03-10 DIAGNOSIS — Z171 Estrogen receptor negative status [ER-]: Principal | ICD-10-CM

## 2018-03-10 NOTE — Telephone Encounter (Signed)
Called patient per 1/17 sch message - no answer and no vmail for patient number - called pt daughter and left message on her phone with appt date and time   -

## 2018-03-11 ENCOUNTER — Inpatient Hospital Stay: Payer: Medicare Other | Admitting: Hematology

## 2018-03-11 ENCOUNTER — Inpatient Hospital Stay: Payer: Medicare Other

## 2018-03-11 ENCOUNTER — Other Ambulatory Visit: Payer: Self-pay

## 2018-03-11 ENCOUNTER — Encounter: Payer: Self-pay | Admitting: Nurse Practitioner

## 2018-03-11 ENCOUNTER — Non-Acute Institutional Stay (SKILLED_NURSING_FACILITY): Payer: Medicare Other | Admitting: Nurse Practitioner

## 2018-03-11 DIAGNOSIS — N318 Other neuromuscular dysfunction of bladder: Secondary | ICD-10-CM | POA: Diagnosis not present

## 2018-03-11 DIAGNOSIS — Z171 Estrogen receptor negative status [ER-]: Principal | ICD-10-CM

## 2018-03-11 DIAGNOSIS — B372 Candidiasis of skin and nail: Secondary | ICD-10-CM | POA: Diagnosis not present

## 2018-03-11 DIAGNOSIS — C50111 Malignant neoplasm of central portion of right female breast: Secondary | ICD-10-CM

## 2018-03-11 DIAGNOSIS — F0150 Vascular dementia without behavioral disturbance: Secondary | ICD-10-CM | POA: Diagnosis not present

## 2018-03-11 NOTE — Assessment & Plan Note (Signed)
Continue SNF [facility] for safety and care assistance.  

## 2018-03-11 NOTE — Assessment & Plan Note (Signed)
Apply 0.1% triamcinolone cream and Nystatin cream bid x 2weeks, assist the patient with incontinent care and personal hygiene, keep the areas dry and clean will facilitate healing. Observe.

## 2018-03-11 NOTE — Progress Notes (Signed)
Location:  [facility] Room Number: 5 Place of Service:  SNF (31) Provider:  , Lennie Odor  NP  ,  X, NP  Patient Care Team: ,  X, NP as PCP - General (Internal Medicine) Juanita Craver, MD as Consulting Physician (Gastroenterology) Shon Hough, MD as Consulting Physician (Ophthalmology) Thornell Sartorius, MD as Consulting Physician (Otolaryngology) [place] ,  X, NP as Nurse Practitioner (Nurse Practitioner) Bo Merino, MD as Consulting Physician (Rheumatology) Suella Broad, MD as Consulting Physician (Physical Medicine and Rehabilitation) Netta Cedars, MD as Consulting Physician (Orthopedic Surgery) Fanny Skates, MD as Consulting Physician (General Surgery) Truitt Merle, MD as Consulting Physician (Hematology) Thea Silversmith, MD as Consulting Physician (Radiation Oncology) Rockwell Germany, RN as Registered Nurse Mauro Kaufmann, RN as Registered Nurse Jake Shark, Johny Blamer, NP as Nurse Practitioner (Nurse Practitioner) [name], MD as Consulting Physician (Cardiology)  Extended Emergency Contact Information Primary Emergency Contact: [name],[name] Address: [address]          [place] [address] [place] Phone: [phone] Mobile Phone: [phone] Relation: Daughter Secondary Emergency Contact: [name],[name]  [place] Mobile Phone: [phone] Relation: None  Code Status:  DNR Goals of care: Advanced Directive information Advanced Directives 02/20/2018  Does Patient Have a Medical Advance Directive? Yes  Type of Advance Directive Out of facility DNR (pink MOST or yellow form)  Does patient want to make changes to medical advance directive? No - Patient declined  Copy of [place] in Chart? -  Pre-existing out of facility DNR order (yellow form or pink MOST form) Yellow form placed in chart (order not valid for inpatient  use);Pink MOST form placed in chart (order not valid for inpatient use)     Chief Complaint  Patient presents with  . Acute Visit    C/O- reddened areas(groin, peri, upper thigh/buttocks)    HPI:  Pt is a 85 y.o. female seen today for an acute visit for beefy redness in groin/perineal/upper inner thighs/buttocks areas, failed SHO nystatin powder and barrier cream. HPI was provided with assistance of staff. She is incontinent of urine, wear adult depend for urinary leakage.    Past Medical History:  Diagnosis Date  . Arthritis    Osteoarthritis  . Cancer of left breast [facility]) August 2016  . Cerebral atherosclerosis 12/19/2012  . Cerebral atrophy ([place]) 12/19/2012  . Cerebral ischemia 12/19/2012   Microvascular   . CKD (chronic kidney disease) stage 3, GFR 30-59 ml/min (HCC) 11/03/2014  . Cough   . Dementia ([place])   . Diverticulosis of colon (without mention of hemorrhage)   . Edema   . Family history of breast cancer   . Flat feet   . GERD (gastroesophageal reflux disease)   . Glaucoma   . Heart murmur   . Hyperlipidemia   . Hypertension   . Hypertonicity of bladder   . Ischemic cardiomyopathy 11/04/2014  . Lumbago   . Memory change 03/28/2016  . Osteoarthrosis, unspecified whether generalized or localized, unspecified site   . Other malaise and fatigue   . Pain in joint, pelvic region and thigh   . Pain in joint, shoulder region   . Personal history of fall   . RLQ abdominal pain 01/17/2011  . Senile osteoporosis   . Small bowel obstruction ([place])   . STEMI (ST elevation myocardial infarction) ([place]) 11/04/2014  . Unstable gait 11/04/2014  . Urine, incontinence, stress female 09/18/2014   Past Surgical History:  Procedure Laterality Date  . BREAST LUMPECTOMY Bilateral   . BREAST LUMPECTOMY WITH RADIOACTIVE SEED LOCALIZATION Left 03/15/2015   Procedure: LEFT BREAST LUMPECTOMY WITH RADIOACTIVE SEED LOCALIZATION;  Surgeon: Fanny Skates, MD;  Location: [facility];  Service:  General;  Laterality: Left;  . CARDIAC CATHETERIZATION N/A 11/08/2014   Procedure: Left Heart Cath and Coronary Angiography;  Surgeon: [name], MD;  Location: [facility];  Service: Cardiovascular;  Laterality: N/A;  . COLON SURGERY  02/1996   Colectomy, partial for diverticular bleed 90%  . EYE SURGERY    . JOINT REPLACEMENT Left 12/2000   Left knee; Dr. Wynelle Link  . LAPAROSCOPIC LYSIS OF ADHESIONS  11/15/10   Exploratory  . TONSILLECTOMY      Allergies  Allergen Reactions  . Sulfa Antibiotics Itching    Outpatient Encounter Medications as of 03/11/2018  Medication Sig  . acetaminophen (TYLENOL) 325 MG tablet Take 650 mg by mouth 3 (three) times daily.   Marland Kitchen atorvastatin (LIPITOR) 10 MG tablet Take 10 mg by mouth at bedtime.   . carboxymethylcellulose (REFRESH PLUS) 0.5 % SOLN Apply 2 drops to eye 2 (two) times daily.  . carvedilol (COREG) 25 MG tablet Take 25 mg by mouth 2 (two) times daily with a meal.  . cloNIDine (CATAPRES) 0.1 MG tablet Take 0.1 mg by mouth 2 (two) times daily.  . Coenzyme Q10 50 MG TABS Take one tablet by mouth at bedtime  . famotidine (PEPCID) 10 MG tablet Take 10 mg by mouth at bedtime.   . folic acid (FOLVITE) 1 MG tablet Take 1 mg by mouth daily.  . hydrALAZINE (APRESOLINE) 25 MG tablet Take 75 mg by mouth 3 (three) times daily.  . iron polysaccharides (NIFEREX) 150 MG capsule Take 150 mg by mouth daily.  . isosorbide mononitrate (IMDUR) 60 MG 24 hr tablet Take 90 mg by mouth at bedtime.   Marland Kitchen latanoprost (XALATAN) 0.005 % ophthalmic solution Place 1 drop into both eyes at bedtime.   . mirtazapine (REMERON) 7.5 MG tablet Take 7.5 mg by mouth at bedtime.  . potassium chloride (K-DUR,KLOR-CON) 10 MEQ tablet Take 10 mEq by mouth daily.   . timolol (TIMOPTIC-XR) 0.5 % ophthalmic gel-forming Place 1 drop into both eyes daily.   Marland Kitchen torsemide (DEMADEX) 10 MG tablet Take 10 mg daily by mouth. Hold for SBP < 110  . vitamin B-12 (CYANOCOBALAMIN) 1000 MCG tablet  Take 1,000 mcg See admin instructions by mouth. Take 1000 mcg by mouth daily On Mondays,Wednesdays and Fridays   No facility-administered encounter medications on file as of 03/11/2018.    ROS was provided with assistance of staff Review of Systems  Constitutional: Negative for activity change, appetite change, chills, diaphoresis, fatigue and fever.  HENT: Positive for hearing loss. Negative for congestion.   Gastrointestinal: Negative for abdominal distention, abdominal pain, constipation, diarrhea, nausea and vomiting.  Genitourinary: Negative for difficulty urinating, dysuria and urgency.       Incontinent of urine.   Skin: Positive for rash.  Psychiatric/Behavioral: Negative for agitation, behavioral problems, hallucinations and sleep disturbance. The patient is not nervous/anxious.     Immunization History  Administered Date(s) Administered  . Influenza Whole 11/19/2011, 12/02/2012, 11/20/2017  . Influenza-Unspecified 12/06/2013, 12/06/2015, 12/02/2016  . PPD Test 11/10/2014  . Pneumococcal Conjugate-13 03/09/2009  . Pneumococcal Polysaccharide-23 02/18/2009  . Tdap 04/02/2016   Pertinent  Health Maintenance Due  Topic Date Due  . MAMMOGRAM  01/14/2019  . INFLUENZA VACCINE  Completed  . DEXA SCAN  Completed  . PNA vac Low Risk Adult  Completed   Fall Risk  02/05/2017 01/31/2017 07/13/2015 11/17/2014 04/21/2014  Falls in the past year? No No No Yes Yes  Number falls in past yr: - - - 1 1  Injury with Fall? - - - No No  Comment - - - - -  Risk for fall due to : - History of fall(s);Impaired balance/gait;Impaired vision - Impaired balance/gait;History of fall(s) -   Functional Status Survey:    Vitals:   03/11/18 1352  BP: (!) 160/80  Pulse: 77  Resp: 20  Temp: (!) 97.1 F (36.2 C)  SpO2: 95%  Weight: 139 lb 6.4 oz (63.2 kg)  Height: 5\' 4"  (1.626 m)   Body mass index is 23.93 kg/m. Physical Exam Constitutional:      General: She is not in acute distress.     Appearance: Normal appearance. She is not ill-appearing, toxic-appearing or diaphoretic.  Abdominal:     General: There is no distension.     Palpations: Abdomen is soft.     Tenderness: There is no abdominal tenderness. There is no guarding or rebound.  Skin:    General: Skin is warm.     Findings: Erythema and rash present.     Comments: Beefy redness in groins, buttocks, groins, upper inner  thighs  Neurological:     General: No focal deficit present.     Mental Status: She is alert. Mental status is at baseline.     Cranial Nerves: No cranial nerve deficit.     Motor: No weakness.     Coordination: Coordination normal.     Gait: Gait abnormal.     Comments: Oriented to person and place.   Psychiatric:        Mood and Affect: Mood normal.        Behavior: Behavior normal.     Labs reviewed: Recent Labs    06/30/17 0820  09/29/17 0836  12/29/17 1004 01/27/18 02/03/18  NA 140   < > 141   < > 138 142 145  K 3.7   < > 3.9   < > 3.6 4.2 4.0  CL 112*   < > 109   < > 107 110 111  CO2 19*   < > 21*   < > 22 22 22   GLUCOSE 141*  --  105*  --  117*  --   --   BUN 26   < > 28*   < > 25* 59* 67*  CREATININE 2.19*   < > 2.14*   < > 2.76* 3.0* 2.9*  CALCIUM 8.8   < > 9.3   < > 8.8* 9.2 9.4   < > = values in this interval not displayed.   Recent Labs    06/30/17 0820  09/29/17 0836 11/20/17 12/29/17 1004  AST 12   < > 16 16 11*  ALT 9   < > 9 8 7   ALKPHOS 46   < > 52 41 45  BILITOT 0.4  --  0.8  --  0.5  PROT 6.9   < > 7.5 5.7 6.7  ALBUMIN 3.9   < > 4.2 3.4 3.2*   < > = values in this interval not displayed.   Recent Labs    06/30/17 0820  09/29/17 0836  12/29/17 1004  02/12/18 02/19/18 03/03/18  WBC 6.0   < > 7.7   < > 7.5   < > 11.7 9.3 5.9  NEUTROABS 4.1  --  5.8  --  5.6  --   --   --   --   HGB 10.6*   < > 11.6   < > 9.5*   < > 7.9* 7.6* 7.9*  HCT 34.0*   < > 37.1   < > 30.0*   < > 25* 24* 25*  MCV 91.2  --  94.2  --  94.6  --   --   --   --   PLT 208   < > 214    < > 197   < > 269 267 186   < > = values in this interval not displayed.   Lab Results  Component Value Date   TSH 2.21 04/04/2016   Lab Results  Component Value Date   HGBA1C 5.6 11/06/2014   Lab Results  Component Value Date   CHOL 113 11/04/2017   HDL 40 11/04/2017   LDLCALC 50 11/04/2017   TRIG 150 11/04/2017   CHOLHDL 3.1 11/06/2014    Significant Diagnostic Results in last 30 days:  No results found.  Assessment/Plan Yeast dermatitis Apply 0.1% triamcinolone cream and Nystatin cream bid x 2weeks, assist the patient with incontinent care and personal hygiene, keep the areas dry and clean will facilitate healing. Observe.   Vascular dementia Continue SNF [facility] for safety and care assistance.   Hypertonicity of bladder Incontinent care is provided with assistance, continue adult depends for urinary leakage     Family/ staff Communication: plan of care reviewed with the patient and charge nurse.   Labs/tests ordered:  none  Time spend 25 minutes

## 2018-03-11 NOTE — Assessment & Plan Note (Signed)
Incontinent care is provided with assistance, continue adult depends for urinary leakage

## 2018-03-12 ENCOUNTER — Inpatient Hospital Stay: Payer: Medicare Other

## 2018-03-13 ENCOUNTER — Encounter: Payer: Self-pay | Admitting: Hematology

## 2018-03-13 ENCOUNTER — Other Ambulatory Visit: Payer: Self-pay

## 2018-03-13 ENCOUNTER — Inpatient Hospital Stay: Payer: Medicare Other | Attending: Hematology

## 2018-03-13 ENCOUNTER — Inpatient Hospital Stay: Payer: Medicare Other

## 2018-03-13 ENCOUNTER — Ambulatory Visit: Payer: Medicare Other | Admitting: Hematology

## 2018-03-13 DIAGNOSIS — Z79899 Other long term (current) drug therapy: Secondary | ICD-10-CM | POA: Insufficient documentation

## 2018-03-13 DIAGNOSIS — C50111 Malignant neoplasm of central portion of right female breast: Secondary | ICD-10-CM | POA: Insufficient documentation

## 2018-03-13 DIAGNOSIS — I10 Essential (primary) hypertension: Secondary | ICD-10-CM | POA: Diagnosis not present

## 2018-03-13 DIAGNOSIS — Z171 Estrogen receptor negative status [ER-]: Principal | ICD-10-CM

## 2018-03-13 DIAGNOSIS — I69354 Hemiplegia and hemiparesis following cerebral infarction affecting left non-dominant side: Secondary | ICD-10-CM | POA: Diagnosis not present

## 2018-03-13 DIAGNOSIS — Z923 Personal history of irradiation: Secondary | ICD-10-CM | POA: Diagnosis not present

## 2018-03-13 DIAGNOSIS — C50412 Malignant neoplasm of upper-outer quadrant of left female breast: Secondary | ICD-10-CM | POA: Insufficient documentation

## 2018-03-13 DIAGNOSIS — F0390 Unspecified dementia without behavioral disturbance: Secondary | ICD-10-CM | POA: Insufficient documentation

## 2018-03-13 DIAGNOSIS — Z87891 Personal history of nicotine dependence: Secondary | ICD-10-CM | POA: Insufficient documentation

## 2018-03-13 DIAGNOSIS — R531 Weakness: Secondary | ICD-10-CM | POA: Insufficient documentation

## 2018-03-13 LAB — CBC WITH DIFFERENTIAL/PLATELET
Abs Immature Granulocytes: 0.04 10*3/uL (ref 0.00–0.07)
Basophils Absolute: 0 10*3/uL (ref 0.0–0.1)
Basophils Relative: 0 %
Eosinophils Absolute: 0.1 10*3/uL (ref 0.0–0.5)
Eosinophils Relative: 2 %
HEMATOCRIT: 28.1 % — AB (ref 36.0–46.0)
Hemoglobin: 8.2 g/dL — ABNORMAL LOW (ref 12.0–15.0)
Immature Granulocytes: 1 %
LYMPHS ABS: 0.9 10*3/uL (ref 0.7–4.0)
LYMPHS PCT: 12 %
MCH: 29.8 pg (ref 26.0–34.0)
MCHC: 29.2 g/dL — ABNORMAL LOW (ref 30.0–36.0)
MCV: 102.2 fL — AB (ref 80.0–100.0)
Monocytes Absolute: 1.2 10*3/uL — ABNORMAL HIGH (ref 0.1–1.0)
Monocytes Relative: 15 %
Neutro Abs: 5.3 10*3/uL (ref 1.7–7.7)
Neutrophils Relative %: 70 %
Platelets: 203 10*3/uL (ref 150–400)
RBC: 2.75 MIL/uL — ABNORMAL LOW (ref 3.87–5.11)
RDW: 17.9 % — ABNORMAL HIGH (ref 11.5–15.5)
WBC: 7.6 10*3/uL (ref 4.0–10.5)
nRBC: 0 % (ref 0.0–0.2)

## 2018-03-13 LAB — COMPREHENSIVE METABOLIC PANEL
ALK PHOS: 51 U/L (ref 38–126)
ALT: 8 U/L (ref 0–44)
AST: 15 U/L (ref 15–41)
Albumin: 3 g/dL — ABNORMAL LOW (ref 3.5–5.0)
Anion gap: 10 (ref 5–15)
BUN: 35 mg/dL — ABNORMAL HIGH (ref 8–23)
CALCIUM: 8.6 mg/dL — AB (ref 8.9–10.3)
CO2: 26 mmol/L (ref 22–32)
Chloride: 107 mmol/L (ref 98–111)
Creatinine, Ser: 2.31 mg/dL — ABNORMAL HIGH (ref 0.44–1.00)
GFR calc non Af Amer: 18 mL/min — ABNORMAL LOW (ref >60)
GFR, EST AFRICAN AMERICAN: 21 mL/min — AB (ref >60)
Glucose, Bld: 113 mg/dL — ABNORMAL HIGH (ref 70–99)
Potassium: 3.6 mmol/L (ref 3.5–5.1)
Sodium: 143 mmol/L (ref 135–145)
Total Bilirubin: 0.3 mg/dL (ref 0.3–1.2)
Total Protein: 6.6 g/dL (ref 6.5–8.1)

## 2018-03-13 LAB — PREPARE RBC (CROSSMATCH)

## 2018-03-13 LAB — ABO/RH: ABO/RH(D): O POS

## 2018-03-13 MED ORDER — DIPHENHYDRAMINE HCL 25 MG PO CAPS: 25.0000 mg | ORAL_CAPSULE | Freq: Once | ORAL | Status: DC

## 2018-03-13 MED ORDER — SODIUM CHLORIDE 0.9% IV SOLUTION
250.0000 mL | Freq: Once | INTRAVENOUS | Status: DC
  Filled 2018-03-13: qty 250

## 2018-03-13 MED ORDER — ACETAMINOPHEN 325 MG PO TABS: 650.0000 mg | ORAL_TABLET | Freq: Once | ORAL | Status: DC

## 2018-03-13 NOTE — Progress Notes (Signed)
Patient became agitated once arriving in the infusion room. She refused assistance to transfer from wheelchair to the recliner. Patient kept repeating I don't need help. I am fine. Patient again refused to have her vitals taken stating I am fine, I don't need help. Many nurses attempted to care for the patient. MD came to infusion to evaluate. Patient severely confused. Dr. Burr Medico called patients daughter to confirm blood transfusion cancelled. Patient discharged from infusion with SNIF care provider.

## 2018-03-13 NOTE — Progress Notes (Deleted)
Between   [phone] Fax:([phone]) [phone]   Clinic Follow up Note   Patient Care Team: Mast, Man X, NP as PCP - General (Internal Medicine) Juanita Craver, MD as Consulting Physician (Gastroenterology) Shon Hough, MD as Consulting Physician (Ophthalmology) Thornell Sartorius, MD as Consulting Physician (Otolaryngology) [place] Mast, Man X, NP as Nurse Practitioner (Nurse Practitioner) Bo Merino, MD as Consulting Physician (Rheumatology) Suella Broad, MD as Consulting Physician (Physical Medicine and Rehabilitation) Netta Cedars, MD as Consulting Physician (Orthopedic Surgery) Fanny Skates, MD as Consulting Physician (General Surgery) Truitt Merle, MD as Consulting Physician (Hematology) Thea Silversmith, MD as Consulting Physician (Radiation Oncology) Rockwell Germany, RN as Registered Nurse Mauro Kaufmann, RN as Registered Nurse Jake Shark, Johny Blamer, NP as Nurse Practitioner (Nurse Practitioner) [name], MD as Consulting Physician (Cardiology) 03/13/2018  CHIEF COMPLAINT: anemia, f/u breast cancer   SUMMARY OF ONCOLOGIC HISTORY: Oncology History   Breast cancer of upper-outer quadrant of left female breast   Staging form: Breast, AJCC 7th Edition     Clinical: Stage 0 (Tis (DCIS), N0, M0) - Unsigned       Breast cancer of upper-outer quadrant of left female breast ([place])   09/23/2014 Mammogram    Left breast: new cluster of calcifications in the left breast is indeterminate. Spot magnification views are recommended    10/13/2014 Initial Biopsy    Left breast biopsy: DCIS, high-grade, with calcification and necrosis. ER- (0%), PR- (0%)    10/13/2014 Clinical Stage    Stage 0: Tis N0    03/15/2015 Definitive Surgery    Left lumpectomy: DCIS, high grade, with comedonecrosis and calcifications, spanning 1.5 cm, ALH, ER- (0%), PR- (0%).    03/15/2015 Pathologic Stage    Stage 0: Tis N0  Diagnosis  03/14/16 Breast, lumpectomy, Left - HIGH GRADE DUCTAL CARCINOMA IN SITU WITH COMEDONECROSIS AND CALCIFICATIONS, SPANNING APPROXIMATELY 1.5 CM IN GREATEST DIMENSION. - ATYPICAL LOBULAR HYPERPLASIA. - DUCTAL CARCINOMA IN SITU IS LESS THAN 0.2 CM IN SEVERAL AREAS TO THE ANTERIOR MARGIN. 1 of 3    03/29/2015 Survivorship    Survivorship care plan completed and mailed to patient in lieu of in person visit     Cancer of central portion of right female breast ([place])   11/26/2016 Mammogram    Diagnostic Mammogram and [place] 11/26/16  IMPRESSION:  The is a 4.2 cm lobulated mass in the right breast central to the nipple anterior depth is highly suggestive of malignancy. The lymph node in the right axilla is at a moderate concern but not classic for malignancy. An ultrasound guided biopsy is recommended.       11/26/2016 Initial Biopsy    Diagnosis 11/26/16 1. Breast, right, needle core biopsy, 12:00 o'clock - INVASIVE DUCTAL CARCINOMA - SEE COMMENT 2. Lymph node, needle/core biopsy, right axilla - METASTATIC CARCINOMA INVOLVING ONE LYMPH NODE     11/26/2016 Receptors her2    Estrogen Receptor: 0%, NEGATIVE Progesterone Receptor: 0%, NEGATIVE Proliferation Marker Ki67: 20% HER2 - NEGATIVE    11/29/2016 Initial Diagnosis    Infiltrating ductal carcinoma of right breast ([place])    01/10/2017 PET scan    PET 01/10/17  IMPRESSION: 1. Right subareolar mass measures 4.7 cm in long axis with maximum SUV 23.6 compatible with malignancy. There are 2 hypermetabolic mildly enlarged right axillary lymph nodes but no other metastatic lesions are identified. 2. Other imaging findings of potential clinical significance: Aortic Atherosclerosis (ICD10-I70.0). Coronary atherosclerosis. Mild cardiomegaly. Trace left pleural effusion. Intracranial  chronic ischemic microvascular white matter disease.    02/14/2017 - 03/14/2017 Radiation Therapy    Radiation therapy with Dr. Isidore Moos     CURRENT THERAPY:  Observation  INTERVAL HISTORY: Ms. Desch was brought in by the staff from her her nursing home for blood transfusion today.  I received a few blood test from her nursing from which showed she was anemic, her hemoglobin was in 7's, and I set up this appointment for blood transfusion and follow-up.  Patient came in a wheelchair, but refused to sit in our recliner chair in the infusion room.  She repeatedly stated "I am doing fine, I do not need anything" . She also refused iv stick. Of note, pt has severe dementia, and the staff from her SNF does not know her well.    REVIEW OF SYSTEMS:   Constitutional: Denies fevers, chills or abnormal weight loss Eyes: Denies blurriness of vision Ears, nose, mouth, throat, and face: Denies mucositis or sore throat Respiratory: Denies cough, dyspnea or wheezes Cardiovascular: Denies palpitation, chest discomfort or lower extremity swelling Gastrointestinal:  Denies nausea, heartburn or change in bowel habits Skin: Denies abnormal skin rashes Lymphatics: Denies new lymphadenopathy or easy bruising Neurological:Denies numbness, tingling or new weaknesses Behavioral/Psych: Mood is stable, no new changes  All other systems were reviewed with the patient and are negative.  MEDICAL HISTORY:  Past Medical History:  Diagnosis Date  . Arthritis    Osteoarthritis  . Cancer of left breast [facility]) August 2016  . Cerebral atherosclerosis 12/19/2012  . Cerebral atrophy ([place]) 12/19/2012  . Cerebral ischemia 12/19/2012   Microvascular   . CKD (chronic kidney disease) stage 3, GFR 30-59 ml/min (HCC) 11/03/2014  . Cough   . Dementia ([place])   . Diverticulosis of colon (without mention of hemorrhage)   . Edema   . Family history of breast cancer   . Flat feet   . GERD (gastroesophageal reflux disease)   . Glaucoma   . Heart murmur   . Hyperlipidemia   . Hypertension   . Hypertonicity of bladder   . Ischemic cardiomyopathy 11/04/2014  . Lumbago   . Memory change  03/28/2016  . Osteoarthrosis, unspecified whether generalized or localized, unspecified site   . Other malaise and fatigue   . Pain in joint, pelvic region and thigh   . Pain in joint, shoulder region   . Personal history of fall   . RLQ abdominal pain 01/17/2011  . Senile osteoporosis   . Small bowel obstruction ([place])   . STEMI (ST elevation myocardial infarction) ([place]) 11/04/2014  . Unstable gait 11/04/2014  . Urine, incontinence, stress female 09/18/2014    SURGICAL HISTORY: Past Surgical History:  Procedure Laterality Date  . BREAST LUMPECTOMY Bilateral   . BREAST LUMPECTOMY WITH RADIOACTIVE SEED LOCALIZATION Left 03/15/2015   Procedure: LEFT BREAST LUMPECTOMY WITH RADIOACTIVE SEED LOCALIZATION;  Surgeon: Fanny Skates, MD;  Location: [facility];  Service: General;  Laterality: Left;  . CARDIAC CATHETERIZATION N/A 11/08/2014   Procedure: Left Heart Cath and Coronary Angiography;  Surgeon: [name], MD;  Location: [facility];  Service: Cardiovascular;  Laterality: N/A;  . COLON SURGERY  02/1996   Colectomy, partial for diverticular bleed 90%  . EYE SURGERY    . JOINT REPLACEMENT Left 12/2000   Left knee; Dr. Wynelle Link  . LAPAROSCOPIC LYSIS OF ADHESIONS  11/15/10   Exploratory  . TONSILLECTOMY      I have reviewed the social history and family history with the patient  and they are unchanged from previous note.  ALLERGIES:  is allergic to sulfa antibiotics.  MEDICATIONS:  Current Outpatient Medications  Medication Sig Dispense Refill  . acetaminophen (TYLENOL) 325 MG tablet Take 650 mg by mouth 3 (three) times daily.     Marland Kitchen atorvastatin (LIPITOR) 10 MG tablet Take 10 mg by mouth at bedtime.     . carboxymethylcellulose (REFRESH PLUS) 0.5 % SOLN Apply 2 drops to eye 2 (two) times daily.    . carvedilol (COREG) 25 MG tablet Take 25 mg by mouth 2 (two) times daily with a meal.    . cloNIDine (CATAPRES) 0.1 MG tablet Take 0.1 mg by mouth 2 (two) times daily.    . Coenzyme Q10  50 MG TABS Take one tablet by mouth at bedtime    . famotidine (PEPCID) 10 MG tablet Take 10 mg by mouth at bedtime.     . folic acid (FOLVITE) 1 MG tablet Take 1 mg by mouth daily.    . hydrALAZINE (APRESOLINE) 25 MG tablet Take 75 mg by mouth 3 (three) times daily.    . iron polysaccharides (NIFEREX) 150 MG capsule Take 150 mg by mouth daily.    . isosorbide mononitrate (IMDUR) 60 MG 24 hr tablet Take 90 mg by mouth at bedtime.     Marland Kitchen latanoprost (XALATAN) 0.005 % ophthalmic solution Place 1 drop into both eyes at bedtime.     . mirtazapine (REMERON) 7.5 MG tablet Take 7.5 mg by mouth at bedtime.    . potassium chloride (K-DUR,KLOR-CON) 10 MEQ tablet Take 10 mEq by mouth daily.     . timolol (TIMOPTIC-XR) 0.5 % ophthalmic gel-forming Place 1 drop into both eyes daily.     Marland Kitchen torsemide (DEMADEX) 10 MG tablet Take 10 mg daily by mouth. Hold for SBP < 110    . vitamin B-12 (CYANOCOBALAMIN) 1000 MCG tablet Take 1,000 mcg See admin instructions by mouth. Take 1000 mcg by mouth daily On Mondays,Wednesdays and Fridays     No current facility-administered medications for this visit.     PHYSICAL EXAMINATION: ECOG PERFORMANCE STATUS: 3 - Symptomatic, >50% confined to bed Pt refused taking vital signs  GENERAL:alert, no distress and comfortable SKIN: skin color, texture, turgor are normal, no rashes or significant lesions EYES: normal, Conjunctiva are pink and non-injected, sclera clear OROPHARYNX:no exudate, no erythema and lips, buccal mucosa, and tongue normal  NECK: supple, thyroid normal size, non-tender, without nodularity LYMPH:  no palpable lymphadenopathy in the cervical, axillary or inguinal LUNGS: clear to auscultation and percussion with normal breathing effort HEART: regular rate & rhythm and no murmurs and no lower extremity edema ABDOMEN:abdomen soft, non-tender and normal bowel sounds Musculoskeletal:no cyanosis of digits and no clubbing  Breasts: Breast inspection showed them to  be symmetrical with no nipple discharge. Palpation of the breasts and axilla revealed no obvious mass that I could appreciate.   LABORATORY DATA:  I have reviewed the data as listed CBC Latest Ref Rng & Units 03/13/2018 03/03/2018 02/19/2018  WBC 4.0 - 10.5 K/uL 7.6 5.9 9.3  Hemoglobin 12.0 - 15.0 g/dL 8.2(L) 7.9(A) 7.6(A)  Hematocrit 36.0 - 46.0 % 28.1(L) 25(A) 24(A)  Platelets 150 - 400 K/uL 203 186 267     CMP Latest Ref Rng & Units 03/13/2018 02/03/2018 01/27/2018  Glucose 70 - 99 mg/dL 113(H) - -  BUN 8 - 23 mg/dL 35(H) 67(A) 59(A)  Creatinine 0.44 - 1.00 mg/dL 2.31(H) 2.9(A) 3.0(A)  Sodium 135 - 145 mmol/L 143 145  142  Potassium 3.5 - 5.1 mmol/L 3.6 4.0 4.2  Chloride 98 - 111 mmol/L 107 111 110  CO2 22 - 32 mmol/L '26 22 22  '$ Calcium 8.9 - 10.3 mg/dL 8.6(L) 9.4 9.2  Total Protein 6.5 - 8.1 g/dL 6.6 - -  Total Bilirubin 0.3 - 1.2 mg/dL 0.3 - -  Alkaline Phos 38 - 126 U/L 51 - -  AST 15 - 41 U/L 15 - -  ALT 0 - 44 U/L 8 - -      RADIOGRAPHIC STUDIES: I have personally reviewed the radiological images as listed and agreed with the findings in the report. No results found.   ASSESSMENT & PLAN:  85 y.o. Caucasian female, postmenopausal, with multiple comorbidities, including a stroke in January 2016, wheelchair bound, dementia, myocardial infarction, CHF, a resident at assisted living and h/o of left breast DCIS in 2016.    1. Infiltrating ductal carcinoma of right breast, invasive ductal carcinoma, cT 3N1M0, stage IIIb, G2, triple Negative -she presented with a large right breast mass and axillary adenopathy, biopsy showed triple negative breast cancer, PET scan was negative for distant metastasis. -Due to her rapidly growing disease she was evaluated for right mastectomy with Dr. Dalbert Batman, but due to her high risk of surgery and anesthesia secondary to her heart condition, patient's daughter has decided not to pursue mastectomy.   -She completed palliative radiation  02/14/17-03/14/17, and her right breast tumor has shrunk significantly, not palpable now  -due to her declining of her overall condition,   2. H/o Left breast DCIS, high-grade, ER negative/PR negative, 2016  -Previously reviewed her scan and breast biopsy results. -The patient had early stage disease. She is considered cured of disease after completing surgical resection.  -She was seen by Dr. Dalbert Batman, lumpectomy without sentinel lymph nodes biopsy was discussed with patient and her daughter. Given her advanced age and medical comorbidities, she does have some risk with anesthesia. Patient and her daughter are in favor of having surgery. Any form of adjuvant treatment is for prevention of disease recurrence.  -Given her negative ER and PR status of her tumor, I did not recommend any antiestrogen therapy.  -If she has wide negative surgical margin, she may not benefit much from adjuvant radiation and also, given her advanced age. She was seen by Dr. Pablo Ledger  -She underwent Left Breast Lumpectomy on 03/15/15 by Dr. Dalbert Batman    3. Dementia -Wheelchair bound, She can feed herself, otherwise she is under full scale care at [facility]  -Her daughter is her Legal guardian   4. HTN, H/o Stroke  - I encouraged her and her caregiver to take her anti-HTN medication as soon as she returns as these levels can cause a stroke. She should monitor her BP daily.    Plan  -Continue observation -Lab and f/u in 4 months  -bilateral Mammogram and right breast US next month at [facility]   No orders of the defined types were placed in this encounter.  All questions were answered. The patient knows to call the clinic with any problems, questions or concerns. No barriers to learning was detected. I spent 15 minutes counseling the patient face to face. The total time spent in the appointment was 20 minutes and more than 50% was on counseling and review of test results     Truitt Merle, MD 03/13/18

## 2018-03-14 LAB — BPAM RBC
Blood Product Expiration Date: 202002272359
ISSUE DATE / TIME: 202001241343
Unit Type and Rh: 5100

## 2018-03-14 LAB — TYPE AND SCREEN
ABO/RH(D): O POS
Antibody Screen: NEGATIVE
Unit division: 0

## 2018-03-23 ENCOUNTER — Encounter: Payer: Self-pay | Admitting: Nurse Practitioner

## 2018-03-23 ENCOUNTER — Non-Acute Institutional Stay (SKILLED_NURSING_FACILITY): Payer: Medicare Other | Admitting: Nurse Practitioner

## 2018-03-23 DIAGNOSIS — M159 Polyosteoarthritis, unspecified: Secondary | ICD-10-CM | POA: Diagnosis not present

## 2018-03-23 DIAGNOSIS — I10 Essential (primary) hypertension: Secondary | ICD-10-CM

## 2018-03-23 DIAGNOSIS — I5032 Chronic diastolic (congestive) heart failure: Secondary | ICD-10-CM | POA: Diagnosis not present

## 2018-03-23 DIAGNOSIS — N184 Chronic kidney disease, stage 4 (severe): Secondary | ICD-10-CM | POA: Diagnosis not present

## 2018-03-23 DIAGNOSIS — I2510 Atherosclerotic heart disease of native coronary artery without angina pectoris: Secondary | ICD-10-CM

## 2018-03-23 DIAGNOSIS — F411 Generalized anxiety disorder: Secondary | ICD-10-CM

## 2018-03-23 DIAGNOSIS — D638 Anemia in other chronic diseases classified elsewhere: Secondary | ICD-10-CM

## 2018-03-23 DIAGNOSIS — I2583 Coronary atherosclerosis due to lipid rich plaque: Secondary | ICD-10-CM

## 2018-03-23 NOTE — Assessment & Plan Note (Signed)
Stable, continue Mirtazapine 7.5mg  qd.

## 2018-03-23 NOTE — Assessment & Plan Note (Signed)
Multiple sites, continue Tylenol 650mg  tid.

## 2018-03-23 NOTE — Assessment & Plan Note (Signed)
Creat 2.31 03/13/18

## 2018-03-23 NOTE — Assessment & Plan Note (Signed)
No angina since last seen, continue Imdur 60mg  qd.

## 2018-03-23 NOTE — Progress Notes (Signed)
Location:  [facility] Room Number: 5 Place of Service:  SNF (31) Provider:  Signa Cheek, Lennie Odor  NP  Dorien Bessent X, NP  Patient Care Team: Jadrian Bulman X, NP as PCP - General (Internal Medicine) Juanita Craver, MD as Consulting Physician (Gastroenterology) Shon Hough, MD as Consulting Physician (Ophthalmology) Thornell Sartorius, MD as Consulting Physician (Otolaryngology) [place] Cervando Durnin X, NP as Nurse Practitioner (Nurse Practitioner) Bo Merino, MD as Consulting Physician (Rheumatology) Suella Broad, MD as Consulting Physician (Physical Medicine and Rehabilitation) Netta Cedars, MD as Consulting Physician (Orthopedic Surgery) Fanny Skates, MD as Consulting Physician (General Surgery) Truitt Merle, MD as Consulting Physician (Hematology) Thea Silversmith, MD as Consulting Physician (Radiation Oncology) Rockwell Germany, RN as Registered Nurse Mauro Kaufmann, RN as Registered Nurse Jake Shark, Johny Blamer, NP as Nurse Practitioner (Nurse Practitioner) [name], MD as Consulting Physician (Cardiology)  Extended Emergency Contact Information Primary Emergency Contact: [name],[name] Address: [address]          [place] [address] [place] Phone: [phone] Mobile Phone: [phone] Relation: Daughter Secondary Emergency Contact: [name],[name]  [place] Mobile Phone: [phone] Relation: None  Code Status:  DNR Goals of care: Advanced Directive information Advanced Directives 03/23/2018  Does Patient Have a Medical Advance Directive? Yes  Type of Advance Directive Out of facility DNR (pink MOST or yellow form)  Does patient want to make changes to medical advance directive? No - Patient declined  Copy of [place] in Chart? -  Pre-existing out of facility DNR order (yellow form or pink MOST form) Yellow form placed in chart (order not valid for inpatient  use);Pink MOST form placed in chart (order not valid for inpatient use)     Chief Complaint  Patient presents with  . Medical Management of Chronic Issues    HPI:  Pt is a 85 y.o. female seen today for medical management of chronic diseases.     The patient has history of anemia, f/u oncology, on Fe, Vit I94, Folic acid, last Hgb 8.2 03/13/18. CKD, last creat 2.31 03/13/18. CHF, compensated clinically, on Torsemide 10mg  qd. Kcl 21m31 qd. She sleeps and eats well, weights stable in the past 2 months, on Mirtazapine 7.5mg  qd. CAD, stable on Imdur 60mg  qd. HTN, blood pressure is controlled on Hydralazine 25mg  tid, Clonidine 0.1mg  bid, Carvedilol 25mg  bid. OA pain, multiple sites, on Tylenol 650mg  tid.     Past Medical History:  Diagnosis Date  . Arthritis    Osteoarthritis  . Cancer of left breast [facility]) August 2016  . Cerebral atherosclerosis 12/19/2012  . Cerebral atrophy ([place]) 12/19/2012  . Cerebral ischemia 12/19/2012   Microvascular   . CKD (chronic kidney disease) stage 3, GFR 30-59 ml/min (HCC) 11/03/2014  . Cough   . Dementia ([place])   . Diverticulosis of colon (without mention of hemorrhage)   . Edema   . Family history of breast cancer   . Flat feet   . GERD (gastroesophageal reflux disease)   . Glaucoma   . Heart murmur   . Hyperlipidemia   . Hypertension   . Hypertonicity of bladder   . Ischemic cardiomyopathy 11/04/2014  . Lumbago   . Memory change 03/28/2016  . Osteoarthrosis, unspecified whether generalized or localized, unspecified site   . Other malaise and fatigue   . Pain in joint, pelvic region and thigh   . Pain in joint, shoulder region   . Personal history of fall   .  RLQ abdominal pain 01/17/2011  . Senile osteoporosis   . Small bowel obstruction ([place])   . STEMI (ST elevation myocardial infarction) ([place]) 11/04/2014  . Unstable gait 11/04/2014  . Urine, incontinence, stress female 09/18/2014   Past Surgical History:  Procedure Laterality Date  . BREAST  LUMPECTOMY Bilateral   . BREAST LUMPECTOMY WITH RADIOACTIVE SEED LOCALIZATION Left 03/15/2015   Procedure: LEFT BREAST LUMPECTOMY WITH RADIOACTIVE SEED LOCALIZATION;  Surgeon: Fanny Skates, MD;  Location: [facility];  Service: General;  Laterality: Left;  . CARDIAC CATHETERIZATION N/A 11/08/2014   Procedure: Left Heart Cath and Coronary Angiography;  Surgeon: [name], MD;  Location: [facility];  Service: Cardiovascular;  Laterality: N/A;  . COLON SURGERY  02/1996   Colectomy, partial for diverticular bleed 90%  . EYE SURGERY    . JOINT REPLACEMENT Left 12/2000   Left knee; Dr. Wynelle Link  . LAPAROSCOPIC LYSIS OF ADHESIONS  11/15/10   Exploratory  . TONSILLECTOMY      Allergies  Allergen Reactions  . Sulfa Antibiotics Itching    Outpatient Encounter Medications as of 03/23/2018  Medication Sig  . acetaminophen (TYLENOL) 325 MG tablet Take 650 mg by mouth 3 (three) times daily.   Marland Kitchen atorvastatin (LIPITOR) 10 MG tablet Take 10 mg by mouth at bedtime.   . carboxymethylcellulose (REFRESH PLUS) 0.5 % SOLN Apply 2 drops to eye 2 (two) times daily.  . carvedilol (COREG) 25 MG tablet Take 25 mg by mouth 2 (two) times daily with a meal.  . cloNIDine (CATAPRES) 0.1 MG tablet Take 0.1 mg by mouth 2 (two) times daily.  . Coenzyme Q10 50 MG TABS Take one tablet by mouth at bedtime  . famotidine (PEPCID) 10 MG tablet Take 10 mg by mouth at bedtime.   . folic acid (FOLVITE) 1 MG tablet Take 1 mg by mouth daily.  . hydrALAZINE (APRESOLINE) 25 MG tablet Take 75 mg by mouth 3 (three) times daily.  . iron polysaccharides (NIFEREX) 150 MG capsule Take 150 mg by mouth daily.  . isosorbide mononitrate (IMDUR) 60 MG 24 hr tablet Take 90 mg by mouth at bedtime.   Marland Kitchen latanoprost (XALATAN) 0.005 % ophthalmic solution Place 1 drop into both eyes at bedtime.   . mirtazapine (REMERON) 7.5 MG tablet Take 7.5 mg by mouth at bedtime.  Marland Kitchen nystatin-triamcinolone (MYCOLOG II) cream Apply topically 2 (two) times daily.  Apply to reddened groins area.  . potassium chloride (K-DUR,KLOR-CON) 10 MEQ tablet Take 10 mEq by mouth daily.   . timolol (TIMOPTIC-XR) 0.5 % ophthalmic gel-forming Place 1 drop into both eyes daily.   Marland Kitchen torsemide (DEMADEX) 10 MG tablet Take 10 mg daily by mouth. Hold for SBP < 110  . vitamin B-12 (CYANOCOBALAMIN) 1000 MCG tablet Take 1,000 mcg See admin instructions by mouth. Take 1000 mcg by mouth daily On Mondays,Wednesdays and Fridays   No facility-administered encounter medications on file as of 03/23/2018.    ROS was provided with assistance of staff Review of Systems  Constitutional: Negative for activity change, appetite change, chills, diaphoresis, fatigue, fever and unexpected weight change.       Weights #133 02/04/18, #134 03/23/18  HENT: Positive for hearing loss. Negative for voice change.   Eyes: Negative for visual disturbance.  Respiratory: Negative for cough, shortness of breath and wheezing.   Gastrointestinal: Negative for abdominal distention, abdominal pain, constipation, diarrhea, nausea and vomiting.  Genitourinary: Negative for difficulty urinating, dysuria, hematuria and urgency.       Incontinent  of urine.   Musculoskeletal: Positive for arthralgias, back pain and gait problem.  Skin: Negative for color change and pallor.       Left antecubital area skin tear.   Neurological: Negative for dizziness, speech difficulty, weakness and headaches.       Dementia  Psychiatric/Behavioral: Negative for agitation, behavioral problems, hallucinations and sleep disturbance. The patient is not nervous/anxious.     Immunization History  Administered Date(s) Administered  . Influenza Whole 11/19/2011, 12/02/2012, 11/20/2017  . Influenza-Unspecified 12/06/2013, 12/06/2015, 12/02/2016  . PPD Test 11/10/2014  . Pneumococcal Conjugate-13 03/09/2009  . Pneumococcal Polysaccharide-23 02/18/2009  . Tdap 04/02/2016   Pertinent  Health Maintenance Due  Topic Date Due  .  MAMMOGRAM  01/14/2019  . INFLUENZA VACCINE  Completed  . DEXA SCAN  Completed  . PNA vac Low Risk Adult  Completed   Fall Risk  02/05/2017 01/31/2017 07/13/2015 11/17/2014 04/21/2014  Falls in the past year? No No No Yes Yes  Number falls in past yr: - - - 1 1  Injury with Fall? - - - No No  Comment - - - - -  Risk for fall due to : - History of fall(s);Impaired balance/gait;Impaired vision - Impaired balance/gait;History of fall(s) -   Functional Status Survey:    Vitals:   03/23/18 1417  BP: (!) 148/66  Pulse: 73  Resp: 18  Temp: (!) 97.5 F (36.4 C)  SpO2: 95%  Weight: 134 lb 12.8 oz (61.1 kg)  Height: 5\' 5"  (1.651 m)   Body mass index is 22.43 kg/m. Physical Exam Constitutional:      General: She is not in acute distress.    Appearance: Normal appearance. She is not ill-appearing, toxic-appearing or diaphoretic.  HENT:     Head: Normocephalic and atraumatic.     Nose: Nose normal. No congestion or rhinorrhea.     Mouth/Throat:     Mouth: Mucous membranes are moist.  Eyes:     Extraocular Movements: Extraocular movements intact.     Pupils: Pupils are equal, round, and reactive to light.  Cardiovascular:     Rate and Rhythm: Normal rate and regular rhythm.     Heart sounds: Murmur present.  Pulmonary:     Effort: Pulmonary effort is normal.     Breath sounds: No wheezing, rhonchi or rales.  Abdominal:     General: There is no distension.     Palpations: Abdomen is soft.     Tenderness: There is no abdominal tenderness. There is no guarding or rebound.  Musculoskeletal:     Right lower leg: Edema present.     Left lower leg: Edema present.     Comments: Trace edema  Skin:    General: Skin is warm.     Findings: Erythema and rash present.     Comments: Beefy redness in groins, buttocks, groins, upper inner  thighs  Neurological:     General: No focal deficit present.     Mental Status: She is alert. Mental status is at baseline.     Cranial Nerves: No cranial  nerve deficit.     Motor: No weakness.     Coordination: Coordination normal.     Gait: Gait abnormal.     Comments: Oriented to person and place.   Psychiatric:        Mood and Affect: Mood normal.        Behavior: Behavior normal.     Labs reviewed: Recent Labs    09/29/17 0836  12/29/17 1004 01/27/18 02/03/18 03/13/18 0908  NA 141   < > 138 142 145 143  K 3.9   < > 3.6 4.2 4.0 3.6  CL 109   < > 107 110 111 107  CO2 21*   < > 22 22 22 26   GLUCOSE 105*  --  117*  --   --  113*  BUN 28*   < > 25* 59* 67* 35*  CREATININE 2.14*   < > 2.76* 3.0* 2.9* 2.31*  CALCIUM 9.3   < > 8.8* 9.2 9.4 8.6*   < > = values in this interval not displayed.   Recent Labs    09/29/17 0836 11/20/17 12/29/17 1004 03/13/18 0908  AST 16 16 11* 15  ALT 9 8 7 8   ALKPHOS 52 41 45 51  BILITOT 0.8  --  0.5 0.3  PROT 7.5 5.7 6.7 6.6  ALBUMIN 4.2 3.4 3.2* 3.0*   Recent Labs    09/29/17 0836  12/29/17 1004  02/19/18 03/03/18 03/13/18 0908  WBC 7.7   < > 7.5   < > 9.3 5.9 7.6  NEUTROABS 5.8  --  5.6  --   --   --  5.3  HGB 11.6   < > 9.5*   < > 7.6* 7.9* 8.2*  HCT 37.1   < > 30.0*   < > 24* 25* 28.1*  MCV 94.2  --  94.6  --   --   --  102.2*  PLT 214   < > 197   < > 267 186 203   < > = values in this interval not displayed.   Lab Results  Component Value Date   TSH 2.21 04/04/2016   Lab Results  Component Value Date   HGBA1C 5.6 11/06/2014   Lab Results  Component Value Date   CHOL 113 11/04/2017   HDL 40 11/04/2017   LDLCALC 50 11/04/2017   TRIG 150 11/04/2017   CHOLHDL 3.1 11/06/2014    Significant Diagnostic Results in last 30 days:  No results found.  Assessment/Plan Chronic diastolic congestive heart failure ([place]) Compensated clinically, continue Torsemide 10mg  qd. Kcl 38meq qd.   Hypertension Blood pressure is controlled, continue Carvedilol 25mg  bid, Hydralazine 25mg  tid, Clonidine 0.1mg  bid.   Generalized osteoarthritis Multiple sites, continue Tylenol 650mg  tid.     CKD (chronic kidney disease) stage 4, GFR 15-29 ml/min (HCC) Creat 2.31 03/13/18  Anemia of chronic disease Chronic, last Hgb 8.2 03/13/18, continue Fe, Vit B12, Folate, f/u oncology.   Anxiety state Stable, continue Mirtazapine 7.5mg  qd.   CAD (coronary artery disease) No angina since last seen, continue Imdur 60mg  qd.      Family/ staff Communication: plan of care reviewed with the patient and charge nurse.   Labs/tests ordered: none  Time spend 25 minutes.

## 2018-03-23 NOTE — Assessment & Plan Note (Signed)
Chronic, last Hgb 8.2 03/13/18, continue Fe, Vit B12, Folate, f/u oncology.

## 2018-03-23 NOTE — Assessment & Plan Note (Signed)
Compensated clinically, continue Torsemide 10mg  qd. Kcl 2meq qd.

## 2018-03-23 NOTE — Assessment & Plan Note (Signed)
Blood pressure is controlled, continue Carvedilol 25mg  bid, Hydralazine 25mg  tid, Clonidine 0.1mg  bid.

## 2018-03-24 DIAGNOSIS — D638 Anemia in other chronic diseases classified elsewhere: Secondary | ICD-10-CM | POA: Diagnosis not present

## 2018-03-24 DIAGNOSIS — N184 Chronic kidney disease, stage 4 (severe): Secondary | ICD-10-CM | POA: Diagnosis not present

## 2018-03-24 LAB — BASIC METABOLIC PANEL
BUN: 31 — AB (ref 4–21)
Creatinine: 2.1 — AB (ref <=1.1)
Glucose: 84
Potassium: 3.8 (ref 3.4–5.3)
SODIUM: 141 (ref 137–147)

## 2018-03-24 LAB — IRON,TIBC AND FERRITIN PANEL
Ferritin: 76
Iron: 29

## 2018-03-24 LAB — CBC AND DIFFERENTIAL
HCT: 27 — AB (ref 36–46)
HEMOGLOBIN: 8.7 — AB (ref 12.0–16.0)
Platelets: 232 (ref 150–399)
WBC: 5.8

## 2018-03-25 ENCOUNTER — Other Ambulatory Visit: Payer: Self-pay | Admitting: *Deleted

## 2018-03-25 LAB — BASIC METABOLIC PANEL
CO2: 25
Calcium: 8.5
Chloride: 107
EGFR (Non-African Amer.): 20
Iron Bind.Cap.(Total): 230

## 2018-03-30 ENCOUNTER — Encounter: Payer: Self-pay | Admitting: Nurse Practitioner

## 2018-03-30 ENCOUNTER — Non-Acute Institutional Stay (SKILLED_NURSING_FACILITY): Payer: Medicare Other | Admitting: Nurse Practitioner

## 2018-03-30 DIAGNOSIS — B372 Candidiasis of skin and nail: Secondary | ICD-10-CM | POA: Diagnosis not present

## 2018-03-30 DIAGNOSIS — F0150 Vascular dementia without behavioral disturbance: Secondary | ICD-10-CM

## 2018-03-30 DIAGNOSIS — N393 Stress incontinence (female) (male): Secondary | ICD-10-CM | POA: Diagnosis not present

## 2018-03-30 NOTE — Progress Notes (Signed)
Location:  [facility] Room Number: 5 Place of Service:  SNF (31) Provider:  , Lennie Odor  NP  ,  X, NP  Patient Care Team: ,  X, NP as PCP - General (Internal Medicine) Juanita Craver, MD as Consulting Physician (Gastroenterology) Shon Hough, MD as Consulting Physician (Ophthalmology) Thornell Sartorius, MD as Consulting Physician (Otolaryngology) [place] ,  X, NP as Nurse Practitioner (Nurse Practitioner) Bo Merino, MD as Consulting Physician (Rheumatology) Suella Broad, MD as Consulting Physician (Physical Medicine and Rehabilitation) Netta Cedars, MD as Consulting Physician (Orthopedic Surgery) Fanny Skates, MD as Consulting Physician (General Surgery) Truitt Merle, MD as Consulting Physician (Hematology) Thea Silversmith, MD as Consulting Physician (Radiation Oncology) Rockwell Germany, RN as Registered Nurse Mauro Kaufmann, RN as Registered Nurse Jake Shark, Johny Blamer, NP as Nurse Practitioner (Nurse Practitioner) [name], MD as Consulting Physician (Cardiology)  Extended Emergency Contact Information Primary Emergency Contact: [name],[name] Address: [address]          [place] [address] Johnnette Litter of [place] Phone: [phone] Mobile Phone: [phone] Relation: Daughter Secondary Emergency Contact: [name],[name]  [place] Mobile Phone: [phone] Relation: None  Code Status:  DNR Goals of care: Advanced Directive information Advanced Directives 03/23/2018  Does Patient Have a Medical Advance Directive? Yes  Type of Advance Directive Out of facility DNR (pink MOST or yellow form)  Does patient want to make changes to medical advance directive? No - Patient declined  Copy of [place] in Chart? -  Pre-existing out of facility DNR order (yellow form or pink MOST form) Yellow form placed in chart (order not valid for inpatient  use);Pink MOST form placed in chart (order not valid for inpatient use)     Chief Complaint  Patient presents with  . Acute Visit    C/o- Redness buttocks    HPI:  Pt is a 85 y.o. female seen today for an acute visit for redness in perineal, groins, inner thighs. HPI was provided with assistance of staff. [facility] resides in [facility] for safety and care assistance. She is incontinent of urine.    Past Medical History:  Diagnosis Date  . Arthritis    Osteoarthritis  . Cancer of left breast [facility]) August 2016  . Cerebral atherosclerosis 12/19/2012  . Cerebral atrophy ([place]) 12/19/2012  . Cerebral ischemia 12/19/2012   Microvascular   . CKD (chronic kidney disease) stage 3, GFR 30-59 ml/min (HCC) 11/03/2014  . Cough   . Dementia ([place])   . Diverticulosis of colon (without mention of hemorrhage)   . Edema   . Family history of breast cancer   . Flat feet   . GERD (gastroesophageal reflux disease)   . Glaucoma   . Heart murmur   . Hyperlipidemia   . Hypertension   . Hypertonicity of bladder   . Ischemic cardiomyopathy 11/04/2014  . Lumbago   . Memory change 03/28/2016  . Osteoarthrosis, unspecified whether generalized or localized, unspecified site   . Other malaise and fatigue   . Pain in joint, pelvic region and thigh   . Pain in joint, shoulder region   . Personal history of fall   . RLQ abdominal pain 01/17/2011  . Senile osteoporosis   . Small bowel obstruction ([place])   . STEMI (ST elevation myocardial infarction) ([place]) 11/04/2014  . Unstable gait 11/04/2014  . Urine, incontinence, stress female 09/18/2014   Past Surgical History:  Procedure Laterality Date  . BREAST LUMPECTOMY  Bilateral   . BREAST LUMPECTOMY WITH RADIOACTIVE SEED LOCALIZATION Left 03/15/2015   Procedure: LEFT BREAST LUMPECTOMY WITH RADIOACTIVE SEED LOCALIZATION;  Surgeon: Fanny Skates, MD;  Location: [facility];  Service: General;  Laterality: Left;  . CARDIAC CATHETERIZATION N/A 11/08/2014   Procedure: Left Heart  Cath and Coronary Angiography;  Surgeon: [name], MD;  Location: [facility];  Service: Cardiovascular;  Laterality: N/A;  . COLON SURGERY  02/1996   Colectomy, partial for diverticular bleed 90%  . EYE SURGERY    . JOINT REPLACEMENT Left 12/2000   Left knee; Dr. Wynelle Link  . LAPAROSCOPIC LYSIS OF ADHESIONS  11/15/10   Exploratory  . TONSILLECTOMY      Allergies  Allergen Reactions  . Sulfa Antibiotics Itching    Outpatient Encounter Medications as of 03/30/2018  Medication Sig  . acetaminophen (TYLENOL) 325 MG tablet Take 650 mg by mouth 3 (three) times daily.   Marland Kitchen atorvastatin (LIPITOR) 10 MG tablet Take 10 mg by mouth at bedtime.   . carboxymethylcellulose (REFRESH PLUS) 0.5 % SOLN Apply 2 drops to eye 2 (two) times daily.  . carvedilol (COREG) 25 MG tablet Take 25 mg by mouth 2 (two) times daily with a meal.  . cloNIDine (CATAPRES) 0.1 MG tablet Take 0.1 mg by mouth 2 (two) times daily.  . Coenzyme Q10 50 MG TABS Take one tablet by mouth at bedtime  . famotidine (PEPCID) 10 MG tablet Take 10 mg by mouth at bedtime.   . folic acid (FOLVITE) 1 MG tablet Take 1 mg by mouth daily.  . hydrALAZINE (APRESOLINE) 25 MG tablet Take 75 mg by mouth 3 (three) times daily.  . iron polysaccharides (NIFEREX) 150 MG capsule Take 150 mg by mouth 2 (two) times daily.   . isosorbide mononitrate (IMDUR) 60 MG 24 hr tablet Take 90 mg by mouth at bedtime.   Marland Kitchen latanoprost (XALATAN) 0.005 % ophthalmic solution Place 1 drop into both eyes at bedtime.   . mirtazapine (REMERON) 7.5 MG tablet Take 7.5 mg by mouth at bedtime.  . Nutritional Supplements (RESOURCE 2.0 PO) Take 180 mLs by mouth 3 (three) times daily with meals.  . nystatin-triamcinolone (MYCOLOG II) cream Apply topically 2 (two) times daily. Apply to reddened groins area.  . potassium chloride (K-DUR,KLOR-CON) 10 MEQ tablet Take 10 mEq by mouth daily.   . timolol (TIMOPTIC-XR) 0.5 % ophthalmic gel-forming Place 1 drop into both eyes  daily.   Marland Kitchen torsemide (DEMADEX) 10 MG tablet Take 20 mg by mouth daily. Hold for SBP < 110  . vitamin B-12 (CYANOCOBALAMIN) 1000 MCG tablet Take 1,000 mcg See admin instructions by mouth. Take 1000 mcg by mouth daily On Mondays,Wednesdays and Fridays   No facility-administered encounter medications on file as of 03/30/2018.    ROS was provided with assistance of staff Review of Systems  Constitutional: Negative for activity change, appetite change, chills, diaphoresis, fatigue and fever.  HENT: Positive for hearing loss. Negative for congestion and voice change.   Respiratory: Negative for cough, shortness of breath and wheezing.   Cardiovascular: Positive for leg swelling. Negative for chest pain and palpitations.  Gastrointestinal: Negative for abdominal distention, abdominal pain, constipation, diarrhea, nausea and vomiting.  Genitourinary: Negative for difficulty urinating, dysuria and urgency.       Incontinent of urine.   Musculoskeletal: Positive for arthralgias, back pain and gait problem.  Skin: Positive for color change and rash.  Neurological: Negative for dizziness, speech difficulty, weakness and light-headedness.  Dementia  Psychiatric/Behavioral: Positive for confusion and dysphoric mood. Negative for agitation, hallucinations and sleep disturbance. The patient is not nervous/anxious.     Immunization History  Administered Date(s) Administered  . Influenza Whole 11/19/2011, 12/02/2012, 11/20/2017  . Influenza-Unspecified 12/06/2013, 12/06/2015, 12/02/2016  . PPD Test 11/10/2014  . Pneumococcal Conjugate-13 03/09/2009  . Pneumococcal Polysaccharide-23 02/18/2009  . Tdap 04/02/2016   Pertinent  Health Maintenance Due  Topic Date Due  . MAMMOGRAM  01/14/2019  . INFLUENZA VACCINE  Completed  . DEXA SCAN  Completed  . PNA vac Low Risk Adult  Completed   Fall Risk  02/05/2017 01/31/2017 07/13/2015 11/17/2014 04/21/2014  Falls in the past year? No No No Yes Yes  Number  falls in past yr: - - - 1 1  Injury with Fall? - - - No No  Comment - - - - -  Risk for fall due to : - History of fall(s);Impaired balance/gait;Impaired vision - Impaired balance/gait;History of fall(s) -   Functional Status Survey:    Vitals:   03/30/18 1555  BP: 138/62  Pulse: 72  Resp: 18  Temp: (!) 97.5 F (36.4 C)  SpO2: 95%  Weight: 131 lb 11.2 oz (59.7 kg)  Height: 5\' 5"  (1.651 m)   Body mass index is 21.92 kg/m. Physical Exam Constitutional:      General: She is not in acute distress.    Appearance: Normal appearance. She is not ill-appearing, toxic-appearing or diaphoretic.  HENT:     Head: Normocephalic and atraumatic.     Nose: Nose normal.     Mouth/Throat:     Mouth: Mucous membranes are moist.  Eyes:     Extraocular Movements: Extraocular movements intact.     Pupils: Pupils are equal, round, and reactive to light.  Neck:     Musculoskeletal: Normal range of motion and neck supple.  Cardiovascular:     Rate and Rhythm: Normal rate and regular rhythm.     Heart sounds: Murmur present.  Pulmonary:     Effort: Pulmonary effort is normal.     Breath sounds: No wheezing, rhonchi or rales.  Abdominal:     General: There is no distension.     Palpations: Abdomen is soft.     Tenderness: There is no abdominal tenderness. There is no guarding or rebound.  Musculoskeletal:     Right lower leg: Edema present.     Left lower leg: Edema present.     Comments: Trace edema BLE. W/c for mobility.   Skin:    General: Skin is warm and dry.     Findings: Erythema and rash present.     Comments: Redness in perineal, urogenital, groins, inner thighs.   Neurological:     General: No focal deficit present.     Mental Status: She is alert. Mental status is at baseline.     Cranial Nerves: No cranial nerve deficit.     Motor: Weakness present.     Coordination: Coordination normal.     Gait: Gait abnormal.     Comments: Oriented to person and her room on unit.     Psychiatric:        Mood and Affect: Mood normal.        Behavior: Behavior normal.     Labs reviewed: Recent Labs    09/29/17 0836  12/29/17 1004  02/03/18 03/13/18 0908 03/24/18  NA 141   < > 138   < > 145 143 141  K 3.9   < >  3.6   < > 4.0 3.6 3.8  CL 109   < > 107   < > 111 107 107  CO2 21*   < > 22   < > 22 26 25   GLUCOSE 105*  --  117*  --   --  113*  --   BUN 28*   < > 25*   < > 67* 35* 31*  CREATININE 2.14*   < > 2.76*   < > 2.9* 2.31* 2.1*  CALCIUM 9.3   < > 8.8*   < > 9.4 8.6* 8.5   < > = values in this interval not displayed.   Recent Labs    09/29/17 0836 11/20/17 12/29/17 1004 03/13/18 0908  AST 16 16 11* 15  ALT 9 8 7 8   ALKPHOS 52 41 45 51  BILITOT 0.8  --  0.5 0.3  PROT 7.5 5.7 6.7 6.6  ALBUMIN 4.2 3.4 3.2* 3.0*   Recent Labs    09/29/17 0836  12/29/17 1004  03/03/18 03/13/18 0908 03/24/18  WBC 7.7   < > 7.5   < > 5.9 7.6 5.8  NEUTROABS 5.8  --  5.6  --   --  5.3  --   HGB 11.6   < > 9.5*   < > 7.9* 8.2* 8.7*  HCT 37.1   < > 30.0*   < > 25* 28.1* 27*  MCV 94.2  --  94.6  --   --  102.2*  --   PLT 214   < > 197   < > 186 203 232   < > = values in this interval not displayed.   Lab Results  Component Value Date   TSH 2.21 04/04/2016   Lab Results  Component Value Date   HGBA1C 5.6 11/06/2014   Lab Results  Component Value Date   CHOL 113 11/04/2017   HDL 40 11/04/2017   LDLCALC 50 11/04/2017   TRIG 150 11/04/2017   CHOLHDL 3.1 11/06/2014    Significant Diagnostic Results in last 30 days:  No results found.  Assessment/Plan Urine, incontinence, stress female She wears adult dependents for incontinent of urine care. She needs assistance for incontinent care frequently.   Yeast dermatitis Urogenital, perineal, groins, inner thighs area, apply Nystatin powder, assist the patient with incontinent care/person hygiene frequently. Observe.   Vascular dementia Continue SNF [facility] for safety and care assistance.      Family/ staff  Communication: plan of care reviewed with the patient and charge nurse.   Labs/tests ordered:  None   Time spend 25 minutes.

## 2018-03-31 ENCOUNTER — Encounter: Payer: Self-pay | Admitting: Nurse Practitioner

## 2018-03-31 NOTE — Assessment & Plan Note (Signed)
She wears adult dependents for incontinent of urine care. She needs assistance for incontinent care frequently.

## 2018-03-31 NOTE — Assessment & Plan Note (Signed)
Urogenital, perineal, groins, inner thighs area, apply Nystatin powder, assist the patient with incontinent care/person hygiene frequently. Observe.

## 2018-03-31 NOTE — Assessment & Plan Note (Signed)
Continue SNF [facility] for safety and care assistance.  

## 2018-04-29 ENCOUNTER — Inpatient Hospital Stay

## 2018-04-29 ENCOUNTER — Inpatient Hospital Stay: Attending: Hematology | Admitting: Hematology

## 2018-05-20 DEATH — deceased
